# Patient Record
Sex: Female | Born: 1950 | Race: White | Hispanic: No | Smoking: Never smoker
Health system: Southern US, Community
[De-identification: ages and names within clinical notes are randomized; demographics above are authoritative.]

## PROBLEM LIST (undated history)

## (undated) DIAGNOSIS — K760 Fatty (change of) liver, not elsewhere classified: Secondary | ICD-10-CM

## (undated) DIAGNOSIS — M1990 Unspecified osteoarthritis, unspecified site: Secondary | ICD-10-CM

## (undated) DIAGNOSIS — E039 Hypothyroidism, unspecified: Secondary | ICD-10-CM

## (undated) DIAGNOSIS — D649 Anemia, unspecified: Secondary | ICD-10-CM

## (undated) DIAGNOSIS — I10 Essential (primary) hypertension: Principal | ICD-10-CM

## (undated) DIAGNOSIS — D696 Thrombocytopenia, unspecified: Secondary | ICD-10-CM

## (undated) DIAGNOSIS — L900 Lichen sclerosus et atrophicus: Secondary | ICD-10-CM

## (undated) DIAGNOSIS — I499 Cardiac arrhythmia, unspecified: Secondary | ICD-10-CM

## (undated) HISTORY — DX: Lichen sclerosus et atrophicus: L90.0

## (undated) HISTORY — DX: Anemia, unspecified: D64.9

## (undated) HISTORY — DX: Unspecified osteoarthritis, unspecified site: M19.90

## (undated) HISTORY — DX: Fatty (change of) liver, not elsewhere classified: K76.0

## (undated) HISTORY — DX: Essential (primary) hypertension: I10

## (undated) HISTORY — DX: Hypothyroidism, unspecified: E03.9

---
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
## 1997-09-21 ENCOUNTER — Ambulatory Visit (HOSPITAL_COMMUNITY): Admission: RE | Admit: 1997-09-21 | Discharge: 1997-09-21 | Payer: Self-pay | Admitting: Obstetrics and Gynecology

## 1997-09-21 ENCOUNTER — Encounter: Payer: Self-pay | Admitting: Obstetrics and Gynecology

## 1998-09-24 ENCOUNTER — Ambulatory Visit (HOSPITAL_COMMUNITY): Admission: RE | Admit: 1998-09-24 | Discharge: 1998-09-24 | Payer: Self-pay | Admitting: Obstetrics and Gynecology

## 1998-09-24 ENCOUNTER — Encounter: Payer: Self-pay | Admitting: Obstetrics and Gynecology

## 2000-02-08 ENCOUNTER — Ambulatory Visit (HOSPITAL_COMMUNITY): Admission: RE | Admit: 2000-02-08 | Discharge: 2000-02-08 | Payer: Self-pay | Admitting: Obstetrics and Gynecology

## 2000-02-08 ENCOUNTER — Encounter: Payer: Self-pay | Admitting: Obstetrics and Gynecology

## 2000-03-27 ENCOUNTER — Encounter: Admission: RE | Admit: 2000-03-27 | Discharge: 2000-03-27 | Payer: Self-pay | Admitting: Obstetrics and Gynecology

## 2000-03-27 ENCOUNTER — Encounter: Payer: Self-pay | Admitting: Obstetrics and Gynecology

## 2000-04-17 ENCOUNTER — Encounter: Admission: RE | Admit: 2000-04-17 | Discharge: 2000-04-17 | Payer: Self-pay | Admitting: Obstetrics and Gynecology

## 2000-04-17 ENCOUNTER — Encounter: Payer: Self-pay | Admitting: Obstetrics and Gynecology

## 2009-01-30 LAB — HM DIABETES EYE EXAM: HM Diabetic Eye Exam: NORMAL

## 2010-02-20 ENCOUNTER — Encounter: Payer: Self-pay | Admitting: Chiropractic Medicine

## 2010-12-19 ENCOUNTER — Encounter: Payer: Self-pay | Admitting: *Deleted

## 2010-12-19 ENCOUNTER — Telehealth: Payer: Self-pay | Admitting: *Deleted

## 2010-12-19 ENCOUNTER — Encounter: Payer: Self-pay | Admitting: Internal Medicine

## 2010-12-19 ENCOUNTER — Ambulatory Visit (INDEPENDENT_AMBULATORY_CARE_PROVIDER_SITE_OTHER): Payer: 59 | Admitting: Internal Medicine

## 2010-12-19 VITALS — BP 152/100 | HR 94 | Temp 99.0°F | Ht 63.0 in | Wt 254.4 lb

## 2010-12-19 DIAGNOSIS — I10 Essential (primary) hypertension: Secondary | ICD-10-CM

## 2010-12-19 DIAGNOSIS — Z0000 Encounter for general adult medical examination without abnormal findings: Secondary | ICD-10-CM

## 2010-12-19 DIAGNOSIS — E669 Obesity, unspecified: Secondary | ICD-10-CM

## 2010-12-19 MED ORDER — LOSARTAN POTASSIUM 100 MG PO TABS: 100.0000 mg | ORAL_TABLET | Freq: Every day | ORAL | Status: DC

## 2010-12-19 NOTE — Progress Notes (Signed)
  Subjective:    Patient ID: Samantha Lutz, female    DOB: 1951-01-16, 60 y.o.   MRN: [id number]  HPI  New patient to me, here today to establish care  Concern about high blood pressure Noted by dentist 2 weeks ago while preparing for dental work Positive family history of same, no personal history of known disease No history of known kidney disease heart disease or prior stroke Also no cholesterol problems or known diabetes Reports weight gain approximately 4 years ago following death of her mother, but relatively stable weight in the past 2-3 years denies regular exercise, nor does she follow low fat, heart healthy diet  Past Medical History  Diagnosis Date  . Chicken pox     Review of Systems  Respiratory: Negative for cough and shortness of breath.   Cardiovascular: Negative for chest pain, palpitations and leg swelling.  Neurological: Negative for dizziness, seizures, weakness and headaches.       Objective:   Physical Exam BP 152/100  Pulse 94  Temp(Src) 99 F (37.2 C) (Oral)  Ht 5\' 3"  (1.6 m)  Wt 254 lb 6.4 oz (115.395 kg)  BMI 45.06 kg/m2  SpO2 99% Weight: 254 lb 6.4 oz (115.395 kg)  Constitutional: She is overweight; appears well-developed and well-nourished. No distress.  HENT: Head: Normocephalic and atraumatic. Ears: B TMs ok, no erythema or effusion; Nose: Nose normal.  Mouth/Throat: Oropharynx is clear and moist. No oropharyngeal exudate.  Eyes: Conjunctivae and EOM are normal. Pupils are equal, round, and reactive to light. No scleral icterus.  Neck: Thick; Normal range of motion. Neck supple. No JVD present. No thyromegaly present.  Cardiovascular: Normal rate, regular rhythm and normal heart sounds.  No murmur heard. No BLE edema. Pulmonary/Chest: Effort normal and breath sounds normal. No respiratory distress. She has no wheezes. Neurological: She is alert and oriented to person, place, and time. No cranial nerve deficit. Coordination normal.  Skin:  Skin is warm and dry. No rash noted. No erythema.  Psychiatric: She has a normal mood and affect. Her behavior is normal. Judgment and thought content normal.   No results found for this basename: WBC, HGB, HCT, PLT, GLUCOSE, CHOL, TRIG, HDL, LDLDIRECT, LDLCALC, ALT, AST, NA, K, CL, CREATININE, BUN, CO2, TSH, PSA, INR, GLUF, HGBA1C, MICROALBUR       Assessment & Plan:  See problem list. Medications and labs reviewed today.

## 2010-12-19 NOTE — Telephone Encounter (Signed)
Entered labs in epic...12/19/10@11 :37am/lmb

## 2010-12-19 NOTE — Telephone Encounter (Signed)
Message copied by Deatra James on Mon Dec 19, 2010 11:37 AM ------      Message from: COUSIN, SHARON T      Created: Mon Dec 19, 2010 11:01 AM      Regarding: PHY DATE:  01/03/11  @ 8A       Lynford Humphrey

## 2010-12-19 NOTE — Patient Instructions (Signed)
It was good to meet you today. We have reviewed your prior records and medical hitstory today Start losartan for your high blood pressure - Your prescription(s) have been submitted to your pharmacy. Please take as directed and contact our office if you believe you are having problem(s) with the medication(s). Letter written for you to get your dentist regarding high blood pressure and starting medications today for same Please schedule followup in 2-4 weeks to recheck blood pressure, adjust medications and check labs, call sooner if problems.

## 2010-12-19 NOTE — Assessment & Plan Note (Signed)
New dx today, noted by dentist in prior weeks Strong FH same Start ARB today- electronic prescription provided, potential risk-benefit of medication review Also education on health and complications of uncontrolled blood pressure including heart disease, stroke risk and kidney disease Will plan labs at next visit in 2-4 weeks with recheck blood pressure to include diabetes and cholesterol screening as well as renal function and urinalysis  BP Readings from Last 3 Encounters:  12/19/10 152/100

## 2010-12-19 NOTE — Assessment & Plan Note (Signed)
Wt Readings from Last 3 Encounters:  12/19/10 254 lb 6.4 oz (115.395 kg)   Reviewed importance of healthy weight to medical control of issues such as hypertension Advise slow initiation of aerobic program on treadmill, 15 minutes 3 times a week working up to 30 minutes 5 times a week Also reviewed heart healthy diet and importance of low sodium in relation to new diagnosis hypertension We'll continue to follow, counsel and work with patient on same

## 2010-12-30 ENCOUNTER — Other Ambulatory Visit (INDEPENDENT_AMBULATORY_CARE_PROVIDER_SITE_OTHER): Payer: 59

## 2010-12-30 DIAGNOSIS — Z0000 Encounter for general adult medical examination without abnormal findings: Secondary | ICD-10-CM

## 2010-12-30 LAB — CBC WITH DIFFERENTIAL/PLATELET
Basophils Absolute: 0 10*3/uL (ref 0.0–0.1)
Basophils Relative: 0.3 % (ref 0.0–3.0)
Eosinophils Absolute: 0.1 10*3/uL (ref 0.0–0.7)
Eosinophils Relative: 1.8 % (ref 0.0–5.0)
HCT: 40 % (ref 36.0–46.0)
Hemoglobin: 13.9 g/dL (ref 12.0–15.0)
Lymphocytes Relative: 31.1 % (ref 12.0–46.0)
Lymphs Abs: 2.1 10*3/uL (ref 0.7–4.0)
MCHC: 34.7 g/dL (ref 30.0–36.0)
MCV: 91.4 fl (ref 78.0–100.0)
Monocytes Absolute: 0.7 10*3/uL (ref 0.1–1.0)
Monocytes Relative: 10.2 % (ref 3.0–12.0)
Neutro Abs: 3.9 10*3/uL (ref 1.4–7.7)
Neutrophils Relative %: 56.6 % (ref 43.0–77.0)
Platelets: 224 10*3/uL (ref 150.0–400.0)
RBC: 4.38 Mil/uL (ref 3.87–5.11)
RDW: 13.1 % (ref 11.5–14.6)
WBC: 6.9 10*3/uL (ref 4.5–10.5)

## 2010-12-30 LAB — LIPID PANEL
Cholesterol: 179 mg/dL (ref 0–200)
HDL: 43.5 mg/dL (ref >39.00)
LDL Cholesterol: 108 mg/dL — ABNORMAL HIGH (ref 0–99)
Total CHOL/HDL Ratio: 4
Triglycerides: 137 mg/dL (ref 0.0–149.0)
VLDL: 27.4 mg/dL (ref 0.0–40.0)

## 2010-12-30 LAB — BASIC METABOLIC PANEL
BUN: 13 mg/dL (ref 6–23)
CO2: 28 mEq/L (ref 19–32)
Calcium: 9.2 mg/dL (ref 8.4–10.5)
Chloride: 104 mEq/L (ref 96–112)
Creatinine, Ser: 0.8 mg/dL (ref 0.4–1.2)
GFR: 75.08 mL/min (ref >60.00)
Glucose, Bld: 102 mg/dL — ABNORMAL HIGH (ref 70–99)
Potassium: 4 mEq/L (ref 3.5–5.1)
Sodium: 140 mEq/L (ref 135–145)

## 2010-12-30 LAB — URINALYSIS, ROUTINE W REFLEX MICROSCOPIC
Bilirubin Urine: NEGATIVE
Ketones, ur: NEGATIVE
Nitrite: NEGATIVE
Specific Gravity, Urine: 1.015 (ref 1.000–1.030)
Total Protein, Urine: NEGATIVE
Urine Glucose: NEGATIVE
Urobilinogen, UA: 0.2 (ref 0.0–1.0)
pH: 5.5 (ref 5.0–8.0)

## 2010-12-30 LAB — HEPATIC FUNCTION PANEL
ALT: 75 U/L — ABNORMAL HIGH (ref 0–35)
AST: 73 U/L — ABNORMAL HIGH (ref 0–37)
Albumin: 4 g/dL (ref 3.5–5.2)
Alkaline Phosphatase: 64 U/L (ref 39–117)
Bilirubin, Direct: 0.2 mg/dL (ref 0.0–0.3)
Total Bilirubin: 0.5 mg/dL (ref 0.3–1.2)
Total Protein: 7.5 g/dL (ref 6.0–8.3)

## 2010-12-30 LAB — TSH: TSH: 4.31 u[IU]/mL (ref 0.35–5.50)

## 2011-01-03 ENCOUNTER — Ambulatory Visit (INDEPENDENT_AMBULATORY_CARE_PROVIDER_SITE_OTHER): Payer: 59 | Admitting: Internal Medicine

## 2011-01-03 ENCOUNTER — Other Ambulatory Visit (INDEPENDENT_AMBULATORY_CARE_PROVIDER_SITE_OTHER): Payer: 59

## 2011-01-03 ENCOUNTER — Encounter: Payer: Self-pay | Admitting: Internal Medicine

## 2011-01-03 VITALS — BP 130/82 | HR 95 | Temp 98.4°F | Wt 248.8 lb

## 2011-01-03 DIAGNOSIS — I10 Essential (primary) hypertension: Secondary | ICD-10-CM

## 2011-01-03 DIAGNOSIS — Z124 Encounter for screening for malignant neoplasm of cervix: Secondary | ICD-10-CM

## 2011-01-03 DIAGNOSIS — Z0000 Encounter for general adult medical examination without abnormal findings: Secondary | ICD-10-CM

## 2011-01-03 DIAGNOSIS — R7989 Other specified abnormal findings of blood chemistry: Secondary | ICD-10-CM

## 2011-01-03 DIAGNOSIS — Z1239 Encounter for other screening for malignant neoplasm of breast: Secondary | ICD-10-CM

## 2011-01-03 DIAGNOSIS — Z1211 Encounter for screening for malignant neoplasm of colon: Secondary | ICD-10-CM

## 2011-01-03 LAB — HEPATIC FUNCTION PANEL
ALT: 54 U/L — ABNORMAL HIGH (ref 0–35)
AST: 46 U/L — ABNORMAL HIGH (ref 0–37)
Albumin: 3.9 g/dL (ref 3.5–5.2)
Alkaline Phosphatase: 61 U/L (ref 39–117)
Bilirubin, Direct: 0.1 mg/dL (ref 0.0–0.3)
Total Bilirubin: 0.6 mg/dL (ref 0.3–1.2)
Total Protein: 7.2 g/dL (ref 6.0–8.3)

## 2011-01-03 NOTE — Assessment & Plan Note (Signed)
Strong FH same Started ARB 12/2010>>taking only 1/2 tab but BP improved The current medical regimen is effective;  continue present plan and medications.   BP Readings from Last 3 Encounters:  01/03/11 130/82  12/19/10 152/100

## 2011-01-03 NOTE — Patient Instructions (Signed)
It was good to see you today. Test(s) ordered today to recheck liver tests - blood and ultrasound of liver. Your results will be called to you after review (48-72hours after test completion). If any changes need to be made, you will be notified at that time. we'll make referral to gynecology for PAP, GI for colonoscopy and for mammogram. Our office will contact you regarding appointment(s) once made. Ok to continue half tablet of losartan for blood pressure control -your blood pressure is much improved Consider the shingles shot and discuss with her insurance company if they will cover for this recommended vaccination You've declined flu shot today -but I encourage you to consider becoming immunized against flu and get the flu shot Please schedule followup in 6 months for blood pressure check, medication review, liver check and weight check; call sooner if problems.

## 2011-01-03 NOTE — Progress Notes (Signed)
Subjective:    Patient ID: Samantha Lutz, female    DOB: 1950-11-12, 60 y.o.   MRN: [id number]  HPI patient is here today for annual physical. Patient feels well and has no complaints.  Also reviewed prior medical issues: hypertension - started losartan 12/2010. the patient reports compliance with medication(s) as prescribed. Denies adverse side effects.  Note increase LFTs on labs -no nausea, right upper quadrant pain. No history of IV drug abuse or blood transfusions-no recent or remote travel.  Past Medical History  Diagnosis Date  . Hypertension    Family History  Problem Relation Age of Onset  . Diabetes Mother   . Stroke Maternal Grandmother 49  . Hypertension Mother   . Arthritis Other   . Hypertension Daughter   . Hypertension Maternal Grandmother   . COPD Mother    History  Substance Use Topics  . Smoking status: Never Smoker   . Smokeless tobacco: Not on file  . Alcohol Use: No     Review of Systems Constitutional: Negative for fever or weight change.  Respiratory: Negative for cough and shortness of breath.   Cardiovascular: Negative for chest pain or palpitations.  Gastrointestinal: Negative for abdominal pain, no bowel changes.  Musculoskeletal: Negative for gait problem or joint swelling.  Skin: Negative for rash.  chronic age related to lichen sclerosis genital region and left breast Neurological: Negative for dizziness or headache.  No other specific complaints in a complete review of systems (except as listed in HPI above).     Objective:   Physical Exam BP 130/82  Pulse 95  Temp(Src) 98.4 F (36.9 C) (Oral)  Wt 248 lb 12.8 oz (112.855 kg)  SpO2 99% Wt Readings from Last 3 Encounters:  01/03/11 248 lb 12.8 oz (112.855 kg)  12/19/10 254 lb 6.4 oz (115.395 kg)   Constitutional: She appears well-developed and well-nourished. No distress.  HENT: Head: Normocephalic and atraumatic. Ears: B TMs ok, no erythema or effusion; Nose: Nose normal.    Mouth/Throat: Oropharynx is clear and moist. No oropharyngeal exudate.  Eyes: Conjunctivae and EOM are normal. Pupils are equal, round, and reactive to light. No scleral icterus.  Neck: Normal range of motion. Neck supple. No JVD present. No thyromegaly present.  Cardiovascular: Normal rate, regular rhythm and normal heart sounds.  No murmur heard. No BLE edema. Pulmonary/Chest: Effort normal and breath sounds normal. No respiratory distress. She has no wheezes.  Abdominal: obese, Soft. Bowel sounds are normal. She exhibits no distension. There is no tenderness. no masses GU: Defer to GYN  Musculoskeletal: Normal range of motion, no joint effusions. No gross deformities Neurological: She is alert and oriented to person, place, and time. No cranial nerve deficit. Coordination normal.  Skin: Skin is warm and dry. No rash noted. No erythema.  lichen sclerosus patch left anterior chest and breast at brought 1  Psychiatric: She has a normal mood and affect. Her behavior is normal. Judgment and thought content normal.   Lab Results  Component Value Date   WBC 6.9 12/30/2010   HGB 13.9 12/30/2010   HCT 40.0 12/30/2010   PLT 224.0 12/30/2010   GLUCOSE 102* 12/30/2010   CHOL 179 12/30/2010   TRIG 137.0 12/30/2010   HDL 43.50 12/30/2010   LDLCALC 108* 12/30/2010   ALT 75* 12/30/2010   AST 73* 12/30/2010   NA 140 12/30/2010   K 4.0 12/30/2010   CL 104 12/30/2010   CREATININE 0.8 12/30/2010   BUN 13 12/30/2010   CO2 28  12/30/2010   TSH 4.31 12/30/2010   EKG: sinus at 92 bpm - PR .11 - no other arrythmia or ischemic abnormalities      Assessment & Plan:  CPX - v70.0 - Patient has been counseled on age-appropriate routine health concerns for screening and prevention. These are reviewed and up-to-date. Immunizations are up-to-date or declined. Labs and ECG reviewed.  Increase LFTs - incidental on CPX - no prior hx same -no symptoms of GI problems or history of transfusion/risk factors for  Hep C - check serologies now, recheck LFTs and abdominal ultrasound to rule out fatty liver or other infiltrative process/GB stones

## 2011-01-04 LAB — HEPATITIS C ANTIBODY: HCV Ab: NEGATIVE

## 2011-01-09 ENCOUNTER — Other Ambulatory Visit: Payer: 59

## 2011-01-10 ENCOUNTER — Ambulatory Visit
Admission: RE | Admit: 2011-01-10 | Discharge: 2011-01-10 | Disposition: A | Discharge status: Home / Self Care | Payer: 59 | Source: Ambulatory Visit | Attending: Internal Medicine | Admitting: Internal Medicine

## 2011-01-10 DIAGNOSIS — R7989 Other specified abnormal findings of blood chemistry: Secondary | ICD-10-CM

## 2011-02-02 ENCOUNTER — Ambulatory Visit: Payer: 59

## 2011-02-06 ENCOUNTER — Ambulatory Visit
Admission: RE | Admit: 2011-02-06 | Discharge: 2011-02-06 | Disposition: A | Discharge status: Home / Self Care | Payer: 59 | Source: Ambulatory Visit | Attending: Internal Medicine | Admitting: Internal Medicine

## 2011-02-06 DIAGNOSIS — Z1239 Encounter for other screening for malignant neoplasm of breast: Secondary | ICD-10-CM

## 2011-07-04 ENCOUNTER — Ambulatory Visit: Payer: 59 | Admitting: Internal Medicine

## 2011-07-12 ENCOUNTER — Ambulatory Visit (INDEPENDENT_AMBULATORY_CARE_PROVIDER_SITE_OTHER)
Admission: RE | Admit: 2011-07-12 | Discharge: 2011-07-12 | Disposition: A | Discharge status: Home / Self Care | Payer: 59 | Source: Ambulatory Visit | Attending: Internal Medicine | Admitting: Internal Medicine

## 2011-07-12 ENCOUNTER — Encounter: Payer: Self-pay | Admitting: Internal Medicine

## 2011-07-12 ENCOUNTER — Ambulatory Visit (INDEPENDENT_AMBULATORY_CARE_PROVIDER_SITE_OTHER): Payer: 59 | Admitting: Internal Medicine

## 2011-07-12 ENCOUNTER — Other Ambulatory Visit (INDEPENDENT_AMBULATORY_CARE_PROVIDER_SITE_OTHER): Payer: 59

## 2011-07-12 VITALS — BP 118/72 | HR 84 | Temp 98.3°F | Ht 63.0 in | Wt 238.6 lb

## 2011-07-12 DIAGNOSIS — M25561 Pain in right knee: Secondary | ICD-10-CM

## 2011-07-12 DIAGNOSIS — I10 Essential (primary) hypertension: Secondary | ICD-10-CM

## 2011-07-12 DIAGNOSIS — M25569 Pain in unspecified knee: Secondary | ICD-10-CM

## 2011-07-12 DIAGNOSIS — R7989 Other specified abnormal findings of blood chemistry: Secondary | ICD-10-CM

## 2011-07-12 LAB — HEPATIC FUNCTION PANEL
ALT: 31 U/L (ref 0–35)
AST: 24 U/L (ref 0–37)
Albumin: 4 g/dL (ref 3.5–5.2)
Alkaline Phosphatase: 58 U/L (ref 39–117)
Bilirubin, Direct: 0.1 mg/dL (ref 0.0–0.3)
Total Bilirubin: 0.5 mg/dL (ref 0.3–1.2)
Total Protein: 7.3 g/dL (ref 6.0–8.3)

## 2011-07-12 MED ORDER — ACETAMINOPHEN 500 MG PO TABS: 1000.0000 mg | ORAL_TABLET | Freq: Two times a day (BID) | ORAL | Status: AC

## 2011-07-12 NOTE — Assessment & Plan Note (Signed)
Chronic with intermittent swelling, none now No injury Suspect DJD Check xray and recommended scheduled tylenol

## 2011-07-12 NOTE — Patient Instructions (Addendum)
It was good to see you today. Test(s) ordered today. Your results will be called to you after review (48-72hours after test completion). If any changes need to be made, you will be notified at that time. Tylenol as discussed for arthritis pain Please schedule followup in 6 months for physical and labs, call sooner if problems.

## 2011-07-12 NOTE — Progress Notes (Signed)
  Subjective:    Patient ID: Samantha Lutz, female    DOB: Jun 16, 1950, 61 y.o.   MRN: [id number]  HPI  Here for follow up - reviewed prior medical issues: hypertension - started losartan 12/2010. the patient reports compliance with medication(s) as prescribed. Denies adverse side effects.  transient increase LFTs on labs 11/12 -no nausea, right upper quadrant pain. No history of IV drug abuse or blood transfusions-no recent or remote travel.   Past Medical History  Diagnosis Date  . Hypertension   . Lichen sclerosus     steroids as needed    Review of Systems  Constitutional: Negative for fever or weight change.  Respiratory: Negative for cough and shortness of breath.   Cardiovascular: Negative for chest pain or palpitations.  Musculoskeletal: Occgait problem with R knee joint swelling, none now.      Objective:   Physical Exam  BP 118/72  Pulse 84  Temp 98.3 F (36.8 C) (Oral)  Ht 5\' 3"  (1.6 m)  Wt 238 lb 9.6 oz (108.228 kg)  BMI 42.27 kg/m2  SpO2 99% Wt Readings from Last 3 Encounters:  07/12/11 238 lb 9.6 oz (108.228 kg)  01/03/11 248 lb 12.8 oz (112.855 kg)  12/19/10 254 lb 6.4 oz (115.395 kg)   Constitutional: She appears well-developed and well-nourished. No distress. g-dtr at side Cardiovascular: Normal rate, regular rhythm and normal heart sounds.  No murmur heard. No BLE edema. Pulmonary/Chest: Effort normal and breath sounds normal. No respiratory distress. She has no wheezes.  Abdominal: obese, Soft. Bowel sounds are normal. She exhibits no distension. There is no tenderness. no masses Musculoskeletal: R knee - boggy synovitis - tender to palpation over joint line; FROM and ligamentous function intact Psychiatric: She has a normal mood and affect. Her behavior is normal. Judgment and thought content normal.   Lab Results  Component Value Date   WBC 6.9 12/30/2010   HGB 13.9 12/30/2010   HCT 40.0 12/30/2010   PLT 224.0 12/30/2010   GLUCOSE 102*  12/30/2010   CHOL 179 12/30/2010   TRIG 137.0 12/30/2010   HDL 43.50 12/30/2010   LDLCALC 108* 12/30/2010   ALT 54* 01/03/2011   AST 46* 01/03/2011   NA 140 12/30/2010   K 4.0 12/30/2010   CL 104 12/30/2010   CREATININE 0.8 12/30/2010   BUN 13 12/30/2010   CO2 28 12/30/2010   TSH 4.31 12/30/2010   EKG: sinus at 92 bpm - PR .11 - no other arrythmia or ischemic abnormalities      Assessment & Plan:

## 2011-07-12 NOTE — Assessment & Plan Note (Signed)
Strong FH same Started ARB 12/2010>>taking only 1/2 tab but BP improved The current medical regimen is effective;  continue present plan and medications.   BP Readings from Last 3 Encounters:  07/12/11 118/72  01/03/11 130/82  12/19/10 152/100

## 2011-07-12 NOTE — Assessment & Plan Note (Signed)
Transient and incidental 12/2010 - resolved on recheck 12/2010 Hep C Ab neg and Abd US with fatty liver, no mass or stones No GI symptoms  Recheck LFTs now to monitor 

## 2012-01-11 ENCOUNTER — Other Ambulatory Visit (INDEPENDENT_AMBULATORY_CARE_PROVIDER_SITE_OTHER): Payer: 59

## 2012-01-11 ENCOUNTER — Encounter: Payer: Self-pay | Admitting: Internal Medicine

## 2012-01-11 ENCOUNTER — Ambulatory Visit (INDEPENDENT_AMBULATORY_CARE_PROVIDER_SITE_OTHER): Payer: 59 | Admitting: Internal Medicine

## 2012-01-11 VITALS — BP 130/82 | HR 97 | Temp 98.8°F | Ht 63.0 in | Wt 242.4 lb

## 2012-01-11 DIAGNOSIS — I10 Essential (primary) hypertension: Secondary | ICD-10-CM

## 2012-01-11 DIAGNOSIS — Z2911 Encounter for prophylactic immunotherapy for respiratory syncytial virus (RSV): Secondary | ICD-10-CM

## 2012-01-11 DIAGNOSIS — Z23 Encounter for immunization: Secondary | ICD-10-CM

## 2012-01-11 DIAGNOSIS — Z0000 Encounter for general adult medical examination without abnormal findings: Secondary | ICD-10-CM

## 2012-01-11 DIAGNOSIS — Z1211 Encounter for screening for malignant neoplasm of colon: Secondary | ICD-10-CM

## 2012-01-11 DIAGNOSIS — E669 Obesity, unspecified: Secondary | ICD-10-CM

## 2012-01-11 LAB — HEPATIC FUNCTION PANEL
ALT: 39 U/L — ABNORMAL HIGH (ref 0–35)
AST: 38 U/L — ABNORMAL HIGH (ref 0–37)
Albumin: 4.1 g/dL (ref 3.5–5.2)
Alkaline Phosphatase: 59 U/L (ref 39–117)
Bilirubin, Direct: 0.1 mg/dL (ref 0.0–0.3)
Total Bilirubin: 0.8 mg/dL (ref 0.3–1.2)
Total Protein: 7.8 g/dL (ref 6.0–8.3)

## 2012-01-11 LAB — URINALYSIS, ROUTINE W REFLEX MICROSCOPIC
Bilirubin Urine: NEGATIVE
Hgb urine dipstick: NEGATIVE
Ketones, ur: NEGATIVE
Leukocytes, UA: NEGATIVE
Nitrite: NEGATIVE
Specific Gravity, Urine: >=1.03 (ref 1.000–1.030)
Total Protein, Urine: NEGATIVE
Urine Glucose: NEGATIVE
Urobilinogen, UA: 0.2 (ref 0.0–1.0)
pH: 5.5 (ref 5.0–8.0)

## 2012-01-11 LAB — LIPID PANEL
Cholesterol: 171 mg/dL (ref 0–200)
HDL: 39.9 mg/dL (ref >39.00)
LDL Cholesterol: 98 mg/dL (ref 0–99)
Total CHOL/HDL Ratio: 4
Triglycerides: 165 mg/dL — ABNORMAL HIGH (ref 0.0–149.0)
VLDL: 33 mg/dL (ref 0.0–40.0)

## 2012-01-11 LAB — BASIC METABOLIC PANEL
BUN: 15 mg/dL (ref 6–23)
CO2: 25 mEq/L (ref 19–32)
Calcium: 9.3 mg/dL (ref 8.4–10.5)
Chloride: 105 mEq/L (ref 96–112)
Creatinine, Ser: 0.9 mg/dL (ref 0.4–1.2)
GFR: 66.35 mL/min (ref >60.00)
Glucose, Bld: 119 mg/dL — ABNORMAL HIGH (ref 70–99)
Potassium: 4.1 mEq/L (ref 3.5–5.1)
Sodium: 137 mEq/L (ref 135–145)

## 2012-01-11 LAB — CBC WITH DIFFERENTIAL/PLATELET
Basophils Absolute: 0 10*3/uL (ref 0.0–0.1)
Basophils Relative: 0.3 % (ref 0.0–3.0)
Eosinophils Absolute: 0.2 10*3/uL (ref 0.0–0.7)
Eosinophils Relative: 2.4 % (ref 0.0–5.0)
HCT: 39.7 % (ref 36.0–46.0)
Hemoglobin: 13.8 g/dL (ref 12.0–15.0)
Lymphocytes Relative: 27.8 % (ref 12.0–46.0)
Lymphs Abs: 2.2 10*3/uL (ref 0.7–4.0)
MCHC: 34.8 g/dL (ref 30.0–36.0)
MCV: 89.8 fl (ref 78.0–100.0)
Monocytes Absolute: 0.8 10*3/uL (ref 0.1–1.0)
Monocytes Relative: 10.3 % (ref 3.0–12.0)
Neutro Abs: 4.6 10*3/uL (ref 1.4–7.7)
Neutrophils Relative %: 59.2 % (ref 43.0–77.0)
Platelets: 212 10*3/uL (ref 150.0–400.0)
RBC: 4.42 Mil/uL (ref 3.87–5.11)
RDW: 13.2 % (ref 11.5–14.6)
WBC: 7.8 10*3/uL (ref 4.5–10.5)

## 2012-01-11 LAB — TSH: TSH: 6.88 u[IU]/mL — ABNORMAL HIGH (ref 0.35–5.50)

## 2012-01-11 NOTE — Progress Notes (Signed)
Subjective:    Patient ID: Samantha Lutz, female    DOB: 08/04/1950, 61 y.o.   MRN: [id number]  HPI  patient is here today for annual physical. Patient feels well and has no complaints.  Also reviewed chronic medical issues: hypertension - started losartan 12/2010. Indep stopped same 10/2011 due to rash side effects - reports hoe BPs normal off meds - home BP log reviewed     Past Medical History  Diagnosis Date  . Hypertension     diet controlled  . Lichen sclerosus     steroids as needed   Family History  Problem Relation Age of Onset  . Diabetes Mother   . Stroke Maternal Grandmother 34  . Hypertension Mother   . Arthritis Other   . Hypertension Daughter   . Hypertension Maternal Grandmother   . COPD Mother    History  Substance Use Topics  . Smoking status: Never Smoker   . Smokeless tobacco: Not on file  . Alcohol Use: No    Review of Systems  Constitutional: Negative for fever or weight change.  Respiratory: Negative for cough and shortness of breath.   Cardiovascular: Negative for chest pain or palpitations.  Gastrointestinal: Negative for abdominal pain, no bowel changes.  Musculoskeletal: Negative for gait problem or joint swelling.  Skin: Negative for rash.  chronic age related to lichen sclerosis genital region and left breast Neurological: Negative for dizziness or headache.  No other specific complaints in a complete review of systems (except as listed in HPI above).     Objective:   Physical Exam  BP 130/82  Pulse 97  Temp 98.8 F (37.1 C) (Oral)  Ht 5\' 3"  (1.6 m)  Wt 242 lb 6.4 oz (109.952 kg)  BMI 42.94 kg/m2  SpO2 98% Wt Readings from Last 3 Encounters:  01/11/12 242 lb 6.4 oz (109.952 kg)  07/12/11 238 lb 9.6 oz (108.228 kg)  01/03/11 248 lb 12.8 oz (112.855 kg)   Constitutional: She is overweight, but appears well-developed and well-nourished. No distress.  HENT: Head: Normocephalic and atraumatic. Ears: B TMs ok, no erythema or  effusion; Nose: Nose normal. Mouth/Throat: Oropharynx is clear and moist. No oropharyngeal exudate.  Eyes: Conjunctivae and EOM are normal. Pupils are equal, round, and reactive to light. No scleral icterus.  Neck: Normal range of motion. Neck supple. No JVD present. No thyromegaly present.  Cardiovascular: Normal rate, regular rhythm and normal heart sounds.  No murmur heard. No BLE edema. Pulmonary/Chest: Effort normal and breath sounds normal. No respiratory distress. She has no wheezes.  Abdominal: obese, Soft. Bowel sounds are normal. She exhibits no distension. There is no tenderness. no masses GU: Defer to GYN  Musculoskeletal: Normal range of motion, no joint effusions. No gross deformities Neurological: She is alert and oriented to person, place, and time. No cranial nerve deficit. Coordination normal.  Skin: Skin is warm and dry. No rash noted. No erythema.  lichen sclerosus patch left anterior chest and breast   Psychiatric: She has a normal mood and affect. Her behavior is normal. Judgment and thought content normal.   Lab Results  Component Value Date   WBC 6.9 12/30/2010   HGB 13.9 12/30/2010   HCT 40.0 12/30/2010   PLT 224.0 12/30/2010   GLUCOSE 102* 12/30/2010   CHOL 179 12/30/2010   TRIG 137.0 12/30/2010   HDL 43.50 12/30/2010   LDLCALC 108* 12/30/2010   ALT 31 07/12/2011   AST 24 07/12/2011   NA 140 12/30/2010  K 4.0 12/30/2010   CL 104 12/30/2010   CREATININE 0.8 12/30/2010   BUN 13 12/30/2010   CO2 28 12/30/2010   TSH 4.31 12/30/2010       Assessment & Plan:  CPX - v70.0 - Patient has been counseled on age-appropriate routine health concerns for screening and prevention. These are reviewed and up-to-date. Immunizations are up-to-date or declined. Labs ordered and reviewed.  See problem list. Medications and labs reviewed today.

## 2012-01-11 NOTE — Patient Instructions (Signed)
It was good to see you today. We have reviewed your prior records including labs and tests today Health Maintenance reviewed - all recommended immunizations and age-appropriate screenings are up-to-date or declined. we'll make referral to GI for colon screening. Our office will contact you regarding appointment(s) once made. Test(s) ordered today. Your results will be released to MyChart (or called to you) after review, usually within 72hours after test completion. If any changes need to be made, you will be notified at that same time. Medications reviewed and updated - none needed at this time. Work on lifestyle changes as discussed (low fat, low carb, increased protein diet; improved exercise efforts; weight loss) to control sugar, blood pressure and cholesterol levels and/or reduce risk of developing other medical problems. Look into LimitLaws.com.cy or other type of food journal to assist you in this process. Please schedule followup in 1 year for medical physical, labs and blood pressure/weight check; call sooner if problems. Health Maintenance, Females A healthy lifestyle and preventative care can promote health and wellness.  Maintain regular health, dental, and eye exams.   Eat a healthy diet. Foods like vegetables, fruits, whole grains, low-fat dairy products, and lean protein foods contain the nutrients you need without too many calories. Decrease your intake of foods high in solid fats, added sugars, and salt. Get information about a proper diet from your caregiver, if necessary.   Regular physical exercise is one of the most important things you can do for your health. Most adults should get at least 150 minutes of moderate-intensity exercise (any activity that increases your heart rate and causes you to sweat) each week. In addition, most adults need muscle-strengthening exercises on 2 or more days a week.     Maintain a healthy weight. The body mass index (BMI) is a screening tool to  identify possible weight problems. It provides an estimate of body fat based on height and weight. Your caregiver can help determine your BMI, and can help you achieve or maintain a healthy weight. For adults 20 years and older:   A BMI below 18.5 is considered underweight.   A BMI of 18.5 to 24.9 is normal.   A BMI of 25 to 29.9 is considered overweight.   A BMI of 30 and above is considered obese.   Maintain normal blood lipids and cholesterol by exercising and minimizing your intake of saturated fat. Eat a balanced diet with plenty of fruits and vegetables. Blood tests for lipids and cholesterol should begin at age 93 and be repeated every 5 years. If your lipid or cholesterol levels are high, you are over 50, or you are a high risk for heart disease, you may need your cholesterol levels checked more frequently. Ongoing high lipid and cholesterol levels should be treated with medicines if diet and exercise are not effective.   If you smoke, find out from your caregiver how to quit. If you do not use tobacco, do not start.   If you are pregnant, do not drink alcohol. If you are breastfeeding, be very cautious about drinking alcohol. If you are not pregnant and choose to drink alcohol, do not exceed 1 drink per day. One drink is considered to be 12 ounces (355 mL) of beer, 5 ounces (148 mL) of wine, or 1.5 ounces (44 mL) of liquor.   Avoid use of street drugs. Do not share needles with anyone. Ask for help if you need support or instructions about stopping the use of drugs.   High  blood pressure causes heart disease and increases the risk of stroke. Blood pressure should be checked at least every 1 to 2 years. Ongoing high blood pressure should be treated with medicines, if weight loss and exercise are not effective.   If you are 104 to 63 years old, ask your caregiver if you should take aspirin to prevent strokes.   Diabetes screening involves taking a blood sample to check your fasting blood  sugar level. This should be done once every 3 years, after age 67, if you are within normal weight and without risk factors for diabetes. Testing should be considered at a younger age or be carried out more frequently if you are overweight and have at least 1 risk factor for diabetes.   Breast cancer screening is essential preventative care for women. You should practice "breast self-awareness." This means understanding the normal appearance and feel of your breasts and may include breast self-examination. Any changes detected, no matter how small, should be reported to a caregiver. Women in their 57s and 30s should have a clinical breast exam (CBE) by a caregiver as part of a regular health exam every 1 to 3 years. After age 83, women should have a CBE every year. Starting at age 68, women should consider having a mammogram (breast X-ray) every year. Women who have a family history of breast cancer should talk to their caregiver about genetic screening. Women at a high risk of breast cancer should talk to their caregiver about having an MRI and a mammogram every year.   The Pap test is a screening test for cervical cancer. Women should have a Pap test starting at age 9. Between ages 42 and 26, Pap tests should be repeated every 2 years. Beginning at age 9, you should have a Pap test every 3 years as long as the past 3 Pap tests have been normal. If you had a hysterectomy for a problem that was not cancer or a condition that could lead to cancer, then you no longer need Pap tests. If you are between ages 81 and 32, and you have had normal Pap tests going back 10 years, you no longer need Pap tests. If you have had past treatment for cervical cancer or a condition that could lead to cancer, you need Pap tests and screening for cancer for at least 20 years after your treatment. If Pap tests have been discontinued, risk factors (such as a new sexual partner) need to be reassessed to determine if screening should  be resumed. Some women have medical problems that increase the chance of getting cervical cancer. In these cases, your caregiver may recommend more frequent screening and Pap tests.   The human papillomavirus (HPV) test is an additional test that may be used for cervical cancer screening. The HPV test looks for the virus that can cause the cell changes on the cervix. The cells collected during the Pap test can be tested for HPV. The HPV test could be used to screen women aged 63 years and older, and should be used in women of any age who have unclear Pap test results. After the age of 109, women should have HPV testing at the same frequency as a Pap test.   Colorectal cancer can be detected and often prevented. Most routine colorectal cancer screening begins at the age of 28 and continues through age 20. However, your caregiver may recommend screening at an earlier age if you have risk factors for colon cancer. On a  yearly basis, your caregiver may provide home test kits to check for hidden blood in the stool. Use of a small camera at the end of a tube, to directly examine the colon (sigmoidoscopy or colonoscopy), can detect the earliest forms of colorectal cancer. Talk to your caregiver about this at age 75, when routine screening begins. Direct examination of the colon should be repeated every 5 to 10 years through age 79, unless early forms of pre-cancerous polyps or small growths are found.   Hepatitis C blood testing is recommended for all people born from [birth year] through 1967 and any individual with known risks for hepatitis C.   Practice safe sex. Use condoms and avoid high-risk sexual practices to reduce the spread of sexually transmitted infections (STIs). Sexually active women aged 19 and younger should be checked for Chlamydia, which is a common sexually transmitted infection. Older women with new or multiple partners should also be tested for Chlamydia. Testing for other STIs is recommended if you  are sexually active and at increased risk.   Osteoporosis is a disease in which the bones lose minerals and strength with aging. This can result in serious bone fractures. The risk of osteoporosis can be identified using a bone density scan. Women ages 71 and over and women at risk for fractures or osteoporosis should discuss screening with their caregivers. Ask your caregiver whether you should be taking a calcium supplement or vitamin D to reduce the rate of osteoporosis.   Menopause can be associated with physical symptoms and risks. Hormone replacement therapy is available to decrease symptoms and risks. You should talk to your caregiver about whether hormone replacement therapy is right for you.   Use sunscreen with a sun protection factor (SPF) of 30 or greater. Apply sunscreen liberally and repeatedly throughout the day. You should seek shade when your shadow is shorter than you. Protect yourself by wearing long sleeves, pants, a wide-brimmed hat, and sunglasses year round, whenever you are outdoors.   Notify your caregiver of new moles or changes in moles, especially if there is a change in shape or color. Also notify your caregiver if a mole is larger than the size of a pencil eraser.   Stay current with your immunizations.  Document Released: 08/01/2010 Document Revised: 04/10/2011 Document Reviewed: 08/01/2010 [facility] Patient Information 2013 [place], [place].   Exercise to Lose Weight Exercise and a healthy diet may help you lose weight. Your doctor may suggest specific exercises. EXERCISE IDEAS AND TIPS  Choose low-cost things you enjoy doing, such as walking, bicycling, or exercising to workout videos.   Take stairs instead of the elevator.   Walk during your lunch break.   Park your car further away from work or school.   Go to a gym or an exercise class.   Start with 5 to 10 minutes of exercise each day. Build up to 30 minutes of exercise 4 to 6 days a week.   Wear shoes  with good support and comfortable clothes.   Stretch before and after working out.   Work out until you breathe harder and your heart beats faster.   Drink extra water when you exercise.   Do not do so much that you hurt yourself, feel dizzy, or get very short of breath.  Exercises that burn about 150 calories:  Running 1  miles in 15 minutes.   Playing volleyball for 45 to 60 minutes.   Washing and waxing a car for 45 to 60 minutes.  Playing touch football for 45 minutes.   Walking 1  miles in 35 minutes.   Pushing a stroller 1  miles in 30 minutes.   Playing basketball for 30 minutes.   Raking leaves for 30 minutes.   Bicycling 5 miles in 30 minutes.   Walking 2 miles in 30 minutes.   Dancing for 30 minutes.   Shoveling snow for 15 minutes.   Swimming laps for 20 minutes.   Walking up stairs for 15 minutes.   Bicycling 4 miles in 15 minutes.   Gardening for 30 to 45 minutes.   Jumping rope for 15 minutes.   Washing windows or floors for 45 to 60 minutes.  Document Released: 02/18/2010 Document Revised: 04/10/2011 Document Reviewed: 02/18/2010 [facility] Patient Information 2013 [place], [place].   Colorectal Cancer Screening Colorectal cancer screening is done to detect early disease. Colorectal refers to the colon and rectum. The colon and rectum are located at the end of the large intestine (digestive system), and carry your bowel movements out of the body. Screening may be done even if you are not experiencing symptoms.   Colorectal cancer screening checks for:  Polyps. These are small growths in the lining of the colon that can turn cancerous.   Cancer that is already growing. Cancer is a cluster of abnormal cells that can cause problems in the body.  REASONS FOR COLORECTAL CANCER SCREENING  It is common for polyps to form in the lining of the colon, especially in older people. These polyps can be cancerous or become cancerous.   Caught early,  colorectal cancer is treatable.   Cancer can be life threatening. Detecting or preventing cancer early can save your life and allow you to enjoy life longer.  TYPES OF SCREENING  Fecal occult blood testing. A stool sample is examined for blood in the laboratory.   Sigmoidoscopy. A sigmoidoscope is used to examine the rectum and lower colon. A sigmoidoscope is a flexible tube with a camera that is inserted through your anus to examine your lower rectum.   Colonoscopy. The longer colonoscope is used to examine the entire colon. A colonoscope is also a thin, flexible tube with a camera. This test examines the colon and rectum.  Other tests include:  Digital rectal exam.   Barium enema.   Stool DNA test.   Virtual colonoscopy is the use of computerized X-ray scan (computed tomography, CT) to take X-ray images of your colon.  WHO SHOULD HAVE COLORECTAL CANCER SCREENING?   Screening is recommended for all adults aged 24 to 75 years.   Screening is generally done every 5 to 10 years or more frequently if you have a family history or symptoms.   Screening is rarely recommended in adults aged 15 to 85 years. Screening is not recommended in adults aged 72 years and older. Your caregiver may recommend screening at a younger age and more frequent screening if you have:  A history of colorectal cancer or polyps.   Family members with histories of colorectal cancer or polyps.   Inflammatory bowel disease, such as ulcerative colitis or Crohn's disease.   A type of hereditary colon cancer syndrome.  Talk with your caregiver about any symptoms, personal and family history. SYMPTOMS OF COLORECTAL CANCER It is important to discuss the following symptoms with your caregiver. These symptoms may be the result of other conditions and may be easily treated:  Rectal bleeding.   Blood in your stool.   Changes in bowel movements (hard or  loose stools). These changes may last several weeks.   Abdominal  cramping.   Feeling the pressure to have a bowel movement when there is no bowel movement.   Feeling tired or weak.   Unexplained weight loss.   Unexplained low red blood cell count. This may also be called iron deficiency anemia.  HOME CARE INSTRUCTIONS    Follow up with your caregiver as directed.   Follow all instructions for preparation before your test as well as after.  PREVENTION   Following healthy lifestyle habits each day can reduce your chance of getting colorectal cancer and many other types of cancer:  Eat a healthy, well-balanced diet rich in fruits and vegetables and low in fats, sugars and cholesterol.   Stay active. Try to exercise at least 4 to 6 times per week for 30 minutes.   Maintain a healthy weight. Ask your caregiver what a healthy weight range is for you.   Women should only drink 1 alcoholic drink per day. Men should only drink 2 alcoholic drinks per day.   Quit smoking.  SEEK MEDICAL CARE IF:    You experience abdominal or rectal symptoms (see Symptoms of Colorectal Cancer).   Your gastrointestinal issues (constipation, diarrhea) do not go away as expected.   You have questions or concerns.  FOR MORE INFORMATION  American Academy of Family Physicians www.familydoctor.org   Centers for Disease Control and Prevention FootballExhibition.com.br   [place] Preventive Services Task Force www.uspreventiveservicestaskforce.org   American Cancer Society www.cancer.org  MAKE SURE YOU:    Understand these instructions.   Will watch your condition.   Will get help right away if you are not doing well or get worse.  Always follow up with your caregiver to find out the results of your tests. Not all test results may be available during your visit. If your test results are not back during the visit, make an appointment with your caregiver to find out the results. Do not assume everything is normal if you have not heard from your caregiver or the medical facility. It is  important for you to follow up on all of your test results.   Document Released: 07/06/2009 Document Revised: 04/10/2011 Document Reviewed: 07/06/2009 [facility] Patient Information 2013 [place], [place].

## 2012-01-11 NOTE — Assessment & Plan Note (Signed)
Strong FH same Started ARB 12/2010>> stopped 10/2011 due to rash side effects BP improved with diet changes - ok to remain off meds - home BP log reviewed Continue monitor and will call if SBP>130 at home  BP Readings from Last 3 Encounters:  01/11/12 130/82  07/12/11 118/72  01/03/11 130/82

## 2012-01-11 NOTE — Assessment & Plan Note (Signed)
Wt Readings from Last 3 Encounters:  01/11/12 242 lb 6.4 oz (109.952 kg)  07/12/11 238 lb 9.6 oz (108.228 kg)  01/03/11 248 lb 12.8 oz (112.855 kg)   Reviewed importance of healthy weight to medical control of issues such as hypertension Advise slow initiation of aerobic program on treadmill, 15 minutes 3 times a week working up to 30 minutes 5 times a week Also reviewed heart healthy diet and importance of low sodium in relation to co-diagnosis hypertension We'll continue to follow, counsel and work with patient on same

## 2012-02-07 ENCOUNTER — Other Ambulatory Visit: Payer: Self-pay | Admitting: Obstetrics and Gynecology

## 2012-02-07 ENCOUNTER — Other Ambulatory Visit: Payer: Self-pay | Admitting: Internal Medicine

## 2012-02-07 DIAGNOSIS — Z1231 Encounter for screening mammogram for malignant neoplasm of breast: Secondary | ICD-10-CM

## 2012-03-29 ENCOUNTER — Ambulatory Visit
Admission: RE | Admit: 2012-03-29 | Discharge: 2012-03-29 | Disposition: A | Discharge status: Home / Self Care | Payer: 59 | Source: Ambulatory Visit | Attending: Obstetrics and Gynecology | Admitting: Obstetrics and Gynecology

## 2012-03-29 DIAGNOSIS — Z1231 Encounter for screening mammogram for malignant neoplasm of breast: Secondary | ICD-10-CM

## 2012-12-05 ENCOUNTER — Other Ambulatory Visit: Payer: Self-pay

## 2013-01-15 ENCOUNTER — Encounter: Payer: Self-pay | Admitting: Internal Medicine

## 2013-01-15 ENCOUNTER — Other Ambulatory Visit (INDEPENDENT_AMBULATORY_CARE_PROVIDER_SITE_OTHER): Payer: 59

## 2013-01-15 ENCOUNTER — Ambulatory Visit (INDEPENDENT_AMBULATORY_CARE_PROVIDER_SITE_OTHER): Payer: 59 | Admitting: Internal Medicine

## 2013-01-15 VITALS — BP 128/88 | HR 86 | Temp 99.9°F | Ht 63.0 in | Wt 253.0 lb

## 2013-01-15 DIAGNOSIS — R739 Hyperglycemia, unspecified: Secondary | ICD-10-CM

## 2013-01-15 DIAGNOSIS — Z1211 Encounter for screening for malignant neoplasm of colon: Secondary | ICD-10-CM

## 2013-01-15 DIAGNOSIS — I10 Essential (primary) hypertension: Secondary | ICD-10-CM

## 2013-01-15 DIAGNOSIS — R7309 Other abnormal glucose: Secondary | ICD-10-CM

## 2013-01-15 DIAGNOSIS — Z0000 Encounter for general adult medical examination without abnormal findings: Secondary | ICD-10-CM

## 2013-01-15 DIAGNOSIS — E669 Obesity, unspecified: Secondary | ICD-10-CM

## 2013-01-15 DIAGNOSIS — E039 Hypothyroidism, unspecified: Secondary | ICD-10-CM

## 2013-01-15 LAB — CBC WITH DIFFERENTIAL/PLATELET
Basophils Absolute: 0.1 10*3/uL (ref 0.0–0.1)
Basophils Relative: 1 % (ref 0.0–3.0)
Eosinophils Absolute: 0.2 10*3/uL (ref 0.0–0.7)
Eosinophils Relative: 1.7 % (ref 0.0–5.0)
HCT: 41 % (ref 36.0–46.0)
Hemoglobin: 14.3 g/dL (ref 12.0–15.0)
Lymphocytes Relative: 24.1 % (ref 12.0–46.0)
Lymphs Abs: 2.5 10*3/uL (ref 0.7–4.0)
MCHC: 34.7 g/dL (ref 30.0–36.0)
MCV: 88.9 fl (ref 78.0–100.0)
Monocytes Absolute: 0.9 10*3/uL (ref 0.1–1.0)
Monocytes Relative: 8.4 % (ref 3.0–12.0)
Neutro Abs: 6.9 10*3/uL (ref 1.4–7.7)
Neutrophils Relative %: 64.8 % (ref 43.0–77.0)
Platelets: 237 10*3/uL (ref 150.0–400.0)
RBC: 4.61 Mil/uL (ref 3.87–5.11)
RDW: 13.2 % (ref 11.5–14.6)
WBC: 10.6 10*3/uL — ABNORMAL HIGH (ref 4.5–10.5)

## 2013-01-15 LAB — HEPATIC FUNCTION PANEL
ALT: 37 U/L — ABNORMAL HIGH (ref 0–35)
AST: 41 U/L — ABNORMAL HIGH (ref 0–37)
Albumin: 4.2 g/dL (ref 3.5–5.2)
Alkaline Phosphatase: 58 U/L (ref 39–117)
Bilirubin, Direct: 0.1 mg/dL (ref 0.0–0.3)
Total Bilirubin: 0.7 mg/dL (ref 0.3–1.2)
Total Protein: 7.7 g/dL (ref 6.0–8.3)

## 2013-01-15 LAB — BASIC METABOLIC PANEL
BUN: 12 mg/dL (ref 6–23)
CO2: 28 mEq/L (ref 19–32)
Calcium: 9.4 mg/dL (ref 8.4–10.5)
Chloride: 105 mEq/L (ref 96–112)
Creatinine, Ser: 0.9 mg/dL (ref 0.4–1.2)
GFR: 67.86 mL/min (ref >60.00)
Glucose, Bld: 87 mg/dL (ref 70–99)
Potassium: 4.8 mEq/L (ref 3.5–5.1)
Sodium: 140 mEq/L (ref 135–145)

## 2013-01-15 LAB — LIPID PANEL
Cholesterol: 180 mg/dL (ref 0–200)
HDL: 39.1 mg/dL (ref >39.00)
LDL Cholesterol: 105 mg/dL — ABNORMAL HIGH (ref 0–99)
Total CHOL/HDL Ratio: 5
Triglycerides: 182 mg/dL — ABNORMAL HIGH (ref 0.0–149.0)
VLDL: 36.4 mg/dL (ref 0.0–40.0)

## 2013-01-15 LAB — TSH: TSH: 8.43 u[IU]/mL — ABNORMAL HIGH (ref 0.35–5.50)

## 2013-01-15 LAB — HEMOGLOBIN A1C: Hgb A1c MFr Bld: 6.1 % (ref 4.6–6.5)

## 2013-01-15 NOTE — Progress Notes (Signed)
Subjective:    Patient ID: Samantha Lutz, female    DOB: December 27, 1950, 62 y.o.   MRN: [id number]  HPI  patient is here today for annual physical. Patient feels well and has no complaints.  Past Medical History  Diagnosis Date  . Hypertension     diet controlled  . Lichen sclerosus     steroids as needed   Family History  Problem Relation Age of Onset  . Diabetes Mother   . Stroke Maternal Grandmother 56  . Hypertension Mother   . Arthritis Other   . Hypertension Daughter   . Hypertension Maternal Grandmother   . COPD Mother    History  Substance Use Topics  . Smoking status: Never Smoker   . Smokeless tobacco: Not on file  . Alcohol Use: No    Review of Systems  Constitutional: Negative for fatigue and unexpected weight change.  Respiratory: Negative for cough, shortness of breath and wheezing.   Cardiovascular: Negative for chest pain, palpitations and leg swelling.  Gastrointestinal: Negative for nausea, abdominal pain and diarrhea.  Neurological: Negative for dizziness, weakness, light-headedness and headaches.  Psychiatric/Behavioral: Negative for dysphoric mood. The patient is not nervous/anxious.   All other systems reviewed and are negative.       Objective:   Physical Exam BP 128/88  Pulse 86  Temp(Src) 99.9 F (37.7 C) (Oral)  Ht 5\' 3"  (1.6 m)  Wt 253 lb (114.76 kg)  BMI 44.83 kg/m2  SpO2 96% Wt Readings from Last 3 Encounters:  01/15/13 253 lb (114.76 kg)  01/11/12 242 lb 6.4 oz (109.952 kg)  07/12/11 238 lb 9.6 oz (108.228 kg)   Constitutional: She is overweight, but appears well-developed and well-nourished. No distress. g-dtr at side HENT: Head: Normocephalic and atraumatic. Ears: B TMs ok, no erythema or effusion; Nose: Nose normal. Mouth/Throat: Oropharynx is clear and moist. No oropharyngeal exudate.  Eyes: Conjunctivae and EOM are normal. Pupils are equal, round, and reactive to light. No scleral icterus.  Neck: Normal range of  motion. Neck supple. No JVD present. No thyromegaly present.  Cardiovascular: Normal rate, regular rhythm and normal heart sounds.  No murmur heard. No BLE edema. Pulmonary/Chest: Effort normal and breath sounds normal. No respiratory distress. She has no wheezes.  Abdominal: Soft. Bowel sounds are normal. She exhibits no distension. There is no tenderness. no masses Musculoskeletal: Normal range of motion, no joint effusions. No gross deformities Neurological: She is alert and oriented to person, place, and time. No cranial nerve deficit. Coordination, balance, strength, speech and gait are normal.  Skin: Skin is warm and dry. No rash noted. No erythema.  Psychiatric: She has a normal mood and affect. Her behavior is normal. Judgment and thought content normal.   Lab Results  Component Value Date   WBC 7.8 01/11/2012   HGB 13.8 01/11/2012   HCT 39.7 01/11/2012   PLT 212.0 01/11/2012   GLUCOSE 119* 01/11/2012   CHOL 171 01/11/2012   TRIG 165.0* 01/11/2012   HDL 39.90 01/11/2012   LDLCALC 98 01/11/2012   ALT 39* 01/11/2012   AST 38* 01/11/2012   NA 137 01/11/2012   K 4.1 01/11/2012   CL 105 01/11/2012   CREATININE 0.9 01/11/2012   BUN 15 01/11/2012   CO2 25 01/11/2012   TSH 6.88* 01/11/2012   ECG: sinus @ 83 - no ischemic changes or arrythmias     Assessment & Plan:   AWV/v70.0 - Patient has been counseled on age-appropriate routine health concerns for  screening and prevention. These are reviewed and up-to-date. Immunizations are up-to-date or declined. Labs ordered/reviewed and ECG reviewed.  Hyperglycemia - check a1c given weight gain  Obese - weight trends reviewed - The patient is asked to make an attempt to improve diet and exercise patterns to aid in medical management of this problem.

## 2013-01-15 NOTE — Progress Notes (Signed)
Pre-visit discussion using our clinic review tool. No additional management support is needed unless otherwise documented below in the visit note.  

## 2013-01-15 NOTE — Patient Instructions (Addendum)
It was good to see you today.  We have reviewed your prior records including labs and tests today  Health Maintenance reviewed - all recommended immunizations and age-appropriate screenings are up-to-date.  We'll have you screened for colon cancer with [facility]UR[facility] (stool DNA testing) - this packet will be sent to your home by the testing company. Complete instructions as in the box, and we will contact you with the results once available.  Test(s) ordered today. Your results will be released to MyChart (or called to you) after review, usually within 72hours after test completion. If any changes need to be made, you will be notified at that same time.  Medications reviewed and updated, no changes recommended at this time.  Work on lifestyle changes as discussed (low fat, low carb, increased protein diet; improved exercise efforts; weight loss) to control sugar, blood pressure and cholesterol levels and/or reduce risk of developing other medical problems. Look into [email or web address] or other type of food journal to assist you in this process.  Please schedule followup in 12 months for annual exam/labs, call sooner if problems.  Health Maintenance, Female A healthy lifestyle and preventative care can promote health and wellness.  Maintain regular health, dental, and eye exams.  Eat a healthy diet. Foods like vegetables, fruits, whole grains, low-fat dairy products, and lean protein foods contain the nutrients you need without too many calories. Decrease your intake of foods high in solid fats, added sugars, and salt. Get information about a proper diet from your caregiver, if necessary.  Regular physical exercise is one of the most important things you can do for your health. Most adults should get at least 150 minutes of moderate-intensity exercise (any activity that increases your heart rate and causes you to sweat) each week. In addition, most adults need muscle-strengthening exercises on 2 or  more days a week.   Maintain a healthy weight. The body mass index (BMI) is a screening tool to identify possible weight problems. It provides an estimate of body fat based on height and weight. Your caregiver can help determine your BMI, and can help you achieve or maintain a healthy weight. For adults 20 years and older:  A BMI below 18.5 is considered underweight.  A BMI of 18.5 to 24.9 is normal.  A BMI of 25 to 29.9 is considered overweight.  A BMI of 30 and above is considered obese.  Maintain normal blood lipids and cholesterol by exercising and minimizing your intake of saturated fat. Eat a balanced diet with plenty of fruits and vegetables. Blood tests for lipids and cholesterol should begin at age 53 and be repeated every 5 years. If your lipid or cholesterol levels are high, you are over 50, or you are a high risk for heart disease, you may need your cholesterol levels checked more frequently.Ongoing high lipid and cholesterol levels should be treated with medicines if diet and exercise are not effective.  If you smoke, find out from your caregiver how to quit. If you do not use tobacco, do not start.  Lung cancer screening is recommended for adults aged 75 80 years who are at high risk for developing lung cancer because of a history of smoking. Yearly low-dose computed tomography (CT) is recommended for people who have at least a 30-pack-year history of smoking and are a current smoker or have quit within the past 15 years. A pack year of smoking is smoking an average of 1 pack of cigarettes a day for 1 year (for example:  1 pack a day for 30 years or 2 packs a day for 15 years). Yearly screening should continue until the smoker has stopped smoking for at least 15 years. Yearly screening should also be stopped for people who develop a health problem that would prevent them from having lung cancer treatment.  If you are pregnant, do not drink alcohol. If you are breastfeeding, be very  cautious about drinking alcohol. If you are not pregnant and choose to drink alcohol, do not exceed 1 drink per day. One drink is considered to be 12 ounces (355 mL) of beer, 5 ounces (148 mL) of wine, or 1.5 ounces (44 mL) of liquor.  Avoid use of street drugs. Do not share needles with anyone. Ask for help if you need support or instructions about stopping the use of drugs.  High blood pressure causes heart disease and increases the risk of stroke. Blood pressure should be checked at least every 1 to 2 years. Ongoing high blood pressure should be treated with medicines, if weight loss and exercise are not effective.  If you are 45 to 64 years old, ask your caregiver if you should take aspirin to prevent strokes.  Diabetes screening involves taking a blood sample to check your fasting blood sugar level. This should be done once every 3 years, after age 52, if you are within normal weight and without risk factors for diabetes. Testing should be considered at a younger age or be carried out more frequently if you are overweight and have at least 1 risk factor for diabetes.  Breast cancer screening is essential preventative care for women. You should practice "breast self-awareness." This means understanding the normal appearance and feel of your breasts and may include breast self-examination. Any changes detected, no matter how small, should be reported to a caregiver. Women in their 60s and 30s should have a clinical breast exam (CBE) by a caregiver as part of a regular health exam every 1 to 3 years. After age 49, women should have a CBE every year. Starting at age 57, women should consider having a mammogram (breast X-ray) every year. Women who have a family history of breast cancer should talk to their caregiver about genetic screening. Women at a high risk of breast cancer should talk to their caregiver about having an MRI and a mammogram every year.  Breast cancer gene (BRCA)-related cancer risk  assessment is recommended for women who have family members with BRCA-related cancers. BRCA-related cancers include breast, ovarian, tubal, and peritoneal cancers. Having family members with these cancers may be associated with an increased risk for harmful changes (mutations) in the breast cancer genes BRCA1 and BRCA2. Results of the assessment will determine the need for genetic counseling and BRCA1 and BRCA2 testing.  The Pap test is a screening test for cervical cancer. Women should have a Pap test starting at age 57. Between ages 35 and 43, Pap tests should be repeated every 2 years. Beginning at age 48, you should have a Pap test every 3 years as long as the past 3 Pap tests have been normal. If you had a hysterectomy for a problem that was not cancer or a condition that could lead to cancer, then you no longer need Pap tests. If you are between ages 24 and 69, and you have had normal Pap tests going back 10 years, you no longer need Pap tests. If you have had past treatment for cervical cancer or a condition that could lead to cancer,  you need Pap tests and screening for cancer for at least 20 years after your treatment. If Pap tests have been discontinued, risk factors (such as a new sexual partner) need to be reassessed to determine if screening should be resumed. Some women have medical problems that increase the chance of getting cervical cancer. In these cases, your caregiver may recommend more frequent screening and Pap tests.  The human papillomavirus (HPV) test is an additional test that may be used for cervical cancer screening. The HPV test looks for the virus that can cause the cell changes on the cervix. The cells collected during the Pap test can be tested for HPV. The HPV test could be used to screen women aged 29 years and older, and should be used in women of any age who have unclear Pap test results. After the age of 82, women should have HPV testing at the same frequency as a Pap  test.  Colorectal cancer can be detected and often prevented. Most routine colorectal cancer screening begins at the age of 46 and continues through age 15. However, your caregiver may recommend screening at an earlier age if you have risk factors for colon cancer. On a yearly basis, your caregiver may provide home test kits to check for hidden blood in the stool. Use of a small camera at the end of a tube, to directly examine the colon (sigmoidoscopy or colonoscopy), can detect the earliest forms of colorectal cancer. Talk to your caregiver about this at age 44, when routine screening begins. Direct examination of the colon should be repeated every 5 to 10 years through age 30, unless early forms of pre-cancerous polyps or small growths are found.  Hepatitis C blood testing is recommended for all people born from [birth year] through 1967 and any individual with known risks for hepatitis C.  Practice safe sex. Use condoms and avoid high-risk sexual practices to reduce the spread of sexually transmitted infections (STIs). Sexually active women aged 76 and younger should be checked for Chlamydia, which is a common sexually transmitted infection. Older women with new or multiple partners should also be tested for Chlamydia. Testing for other STIs is recommended if you are sexually active and at increased risk.  Osteoporosis is a disease in which the bones lose minerals and strength with aging. This can result in serious bone fractures. The risk of osteoporosis can be identified using a bone density scan. Women ages 38 and over and women at risk for fractures or osteoporosis should discuss screening with their caregivers. Ask your caregiver whether you should be taking a calcium supplement or vitamin D to reduce the rate of osteoporosis.  Menopause can be associated with physical symptoms and risks. Hormone replacement therapy is available to decrease symptoms and risks. You should talk to your caregiver about  whether hormone replacement therapy is right for you.  Use sunscreen. Apply sunscreen liberally and repeatedly throughout the day. You should seek shade when your shadow is shorter than you. Protect yourself by wearing long sleeves, pants, a wide-brimmed hat, and sunglasses year round, whenever you are outdoors.  Notify your caregiver of new moles or changes in moles, especially if there is a change in shape or color. Also notify your caregiver if a mole is larger than the size of a pencil eraser.  Stay current with your immunizations. Document Released: 08/01/2010 Document Revised: 05/13/2012 Document Reviewed: 08/01/2010 [facility] Patient Information 2014 [place], [place]. Exercise to Lose Weight Exercise and a healthy diet may help you  lose weight. Your doctor may suggest specific exercises. EXERCISE IDEAS AND TIPS  Choose low-cost things you enjoy doing, such as walking, bicycling, or exercising to workout videos.  Take stairs instead of the elevator.  Walk during your lunch break.  Park your car further away from work or school.  Go to a gym or an exercise class.  Start with 5 to 10 minutes of exercise each day. Build up to 30 minutes of exercise 4 to 6 days a week.  Wear shoes with good support and comfortable clothes.  Stretch before and after working out.  Work out until you breathe harder and your heart beats faster.  Drink extra water when you exercise.  Do not do so much that you hurt yourself, feel dizzy, or get very short of breath. Exercises that burn about 150 calories:  Running 1  miles in 15 minutes.  Playing volleyball for 45 to 60 minutes.  Washing and waxing a car for 45 to 60 minutes.  Playing touch football for 45 minutes.  Walking 1  miles in 35 minutes.  Pushing a stroller 1  miles in 30 minutes.  Playing basketball for 30 minutes.  Raking leaves for 30 minutes.  Bicycling 5 miles in 30 minutes.  Walking 2 miles in 30 minutes.  Dancing  for 30 minutes.  Shoveling snow for 15 minutes.  Swimming laps for 20 minutes.  Walking up stairs for 15 minutes.  Bicycling 4 miles in 15 minutes.  Gardening for 30 to 45 minutes.  Jumping rope for 15 minutes.  Washing windows or floors for 45 to 60 minutes. Document Released: 02/18/2010 Document Revised: 04/10/2011 Document Reviewed: 02/18/2010 [facility] Patient Information 2014 [place], [place].

## 2013-01-16 ENCOUNTER — Encounter: Payer: Self-pay | Admitting: Internal Medicine

## 2013-01-16 DIAGNOSIS — E039 Hypothyroidism, unspecified: Secondary | ICD-10-CM | POA: Insufficient documentation

## 2013-01-16 MED ORDER — LEVOTHYROXINE SODIUM 50 MCG PO TABS: 50.0000 ug | ORAL_TABLET | Freq: Every day | ORAL | Status: DC

## 2013-01-16 NOTE — Addendum Note (Signed)
Addended by: Rene Paci A on: 01/16/2013 09:12 PM   Modules accepted: Orders

## 2013-03-11 LAB — COLOGUARD: Cologuard: NEGATIVE

## 2013-03-11 LAB — HM COLONOSCOPY: HM Colonoscopy: NEGATIVE

## 2013-03-24 ENCOUNTER — Encounter: Payer: Self-pay | Admitting: Internal Medicine

## 2013-04-21 ENCOUNTER — Encounter: Payer: Self-pay | Admitting: Internal Medicine

## 2013-10-30 ENCOUNTER — Other Ambulatory Visit: Payer: Self-pay

## 2013-10-30 DIAGNOSIS — Z1231 Encounter for screening mammogram for malignant neoplasm of breast: Secondary | ICD-10-CM

## 2013-11-19 ENCOUNTER — Ambulatory Visit
Admission: RE | Admit: 2013-11-19 | Discharge: 2013-11-19 | Disposition: A | Discharge status: Home / Self Care | Payer: 59 | Source: Ambulatory Visit

## 2013-11-19 DIAGNOSIS — Z1231 Encounter for screening mammogram for malignant neoplasm of breast: Secondary | ICD-10-CM

## 2014-01-15 ENCOUNTER — Ambulatory Visit (INDEPENDENT_AMBULATORY_CARE_PROVIDER_SITE_OTHER): Payer: Medicare Other | Admitting: Internal Medicine

## 2014-01-15 ENCOUNTER — Encounter: Payer: Self-pay | Admitting: Internal Medicine

## 2014-01-15 ENCOUNTER — Other Ambulatory Visit (INDEPENDENT_AMBULATORY_CARE_PROVIDER_SITE_OTHER): Payer: Medicare Other

## 2014-01-15 ENCOUNTER — Ambulatory Visit (INDEPENDENT_AMBULATORY_CARE_PROVIDER_SITE_OTHER)
Admission: RE | Admit: 2014-01-15 | Discharge: 2014-01-15 | Disposition: A | Discharge status: Home / Self Care | Payer: Medicare Other | Source: Ambulatory Visit | Attending: Internal Medicine | Admitting: Internal Medicine

## 2014-01-15 VITALS — BP 130/82 | HR 93 | Temp 98.1°F | Ht 63.0 in | Wt 250.8 lb

## 2014-01-15 DIAGNOSIS — E039 Hypothyroidism, unspecified: Secondary | ICD-10-CM

## 2014-01-15 DIAGNOSIS — Z13820 Encounter for screening for osteoporosis: Secondary | ICD-10-CM

## 2014-01-15 DIAGNOSIS — R7989 Other specified abnormal findings of blood chemistry: Secondary | ICD-10-CM

## 2014-01-15 DIAGNOSIS — I10 Essential (primary) hypertension: Secondary | ICD-10-CM

## 2014-01-15 DIAGNOSIS — E669 Obesity, unspecified: Secondary | ICD-10-CM

## 2014-01-15 DIAGNOSIS — R945 Abnormal results of liver function studies: Secondary | ICD-10-CM

## 2014-01-15 DIAGNOSIS — Z0000 Encounter for general adult medical examination without abnormal findings: Secondary | ICD-10-CM

## 2014-01-15 LAB — BASIC METABOLIC PANEL
BUN: 17 mg/dL (ref 6–23)
CO2: 24 mEq/L (ref 19–32)
Calcium: 9.6 mg/dL (ref 8.4–10.5)
Chloride: 104 mEq/L (ref 96–112)
Creatinine, Ser: 0.9 mg/dL (ref 0.4–1.2)
GFR: 67.64 mL/min (ref >60.00)
Glucose, Bld: 118 mg/dL — ABNORMAL HIGH (ref 70–99)
Potassium: 4.4 mEq/L (ref 3.5–5.1)
Sodium: 140 mEq/L (ref 135–145)

## 2014-01-15 LAB — CBC WITH DIFFERENTIAL/PLATELET
Basophils Absolute: 0 10*3/uL (ref 0.0–0.1)
Basophils Relative: 0.1 % (ref 0.0–3.0)
Eosinophils Absolute: 0.2 10*3/uL (ref 0.0–0.7)
Eosinophils Relative: 2.5 % (ref 0.0–5.0)
HCT: 41.6 % (ref 36.0–46.0)
Hemoglobin: 14 g/dL (ref 12.0–15.0)
Lymphocytes Relative: 30.9 % (ref 12.0–46.0)
Lymphs Abs: 2.6 10*3/uL (ref 0.7–4.0)
MCHC: 33.6 g/dL (ref 30.0–36.0)
MCV: 90.6 fl (ref 78.0–100.0)
Monocytes Absolute: 0.8 10*3/uL (ref 0.1–1.0)
Monocytes Relative: 9.1 % (ref 3.0–12.0)
Neutro Abs: 4.8 10*3/uL (ref 1.4–7.7)
Neutrophils Relative %: 57.4 % (ref 43.0–77.0)
Platelets: 231 10*3/uL (ref 150.0–400.0)
RBC: 4.59 Mil/uL (ref 3.87–5.11)
RDW: 14 % (ref 11.5–15.5)
WBC: 8.4 10*3/uL (ref 4.0–10.5)

## 2014-01-15 LAB — URINALYSIS, ROUTINE W REFLEX MICROSCOPIC
Bilirubin Urine: NEGATIVE
Hgb urine dipstick: NEGATIVE
Ketones, ur: NEGATIVE
Leukocytes, UA: NEGATIVE
Nitrite: NEGATIVE
RBC / HPF: NONE SEEN (ref 0–?)
Specific Gravity, Urine: 1.025 (ref 1.000–1.030)
Total Protein, Urine: NEGATIVE
Urine Glucose: NEGATIVE
Urobilinogen, UA: 0.2 (ref 0.0–1.0)
WBC, UA: NONE SEEN (ref 0–?)
pH: 5.5 (ref 5.0–8.0)

## 2014-01-15 LAB — HEPATIC FUNCTION PANEL
ALT: 41 U/L — ABNORMAL HIGH (ref 0–35)
AST: 40 U/L — ABNORMAL HIGH (ref 0–37)
Albumin: 4.1 g/dL (ref 3.5–5.2)
Alkaline Phosphatase: 53 U/L (ref 39–117)
Bilirubin, Direct: 0.1 mg/dL (ref 0.0–0.3)
Total Bilirubin: 0.7 mg/dL (ref 0.2–1.2)
Total Protein: 7.5 g/dL (ref 6.0–8.3)

## 2014-01-15 LAB — LIPID PANEL
Cholesterol: 166 mg/dL (ref 0–200)
HDL: 41.1 mg/dL (ref >39.00)
LDL Cholesterol: 98 mg/dL (ref 0–99)
NonHDL: 124.9
Total CHOL/HDL Ratio: 4
Triglycerides: 134 mg/dL (ref 0.0–149.0)
VLDL: 26.8 mg/dL (ref 0.0–40.0)

## 2014-01-15 LAB — TSH: TSH: 8.88 u[IU]/mL — ABNORMAL HIGH (ref 0.35–4.50)

## 2014-01-15 NOTE — Assessment & Plan Note (Signed)
Transient and incidental 12/2010 - resolved on recheck 12/2010 Hep C Ab neg and Abd [place] with fatty liver, no mass or stones No GI symptoms  Recheck LFTs now to monitor

## 2014-01-15 NOTE — Assessment & Plan Note (Signed)
Wt Readings from Last 3 Encounters:  01/15/14 250 lb 12 oz (113.739 kg)  01/15/13 253 lb (114.76 kg)  01/11/12 242 lb 6.4 oz (109.952 kg)   Reviewed importance of healthy weight to medical control of issues such as hypertension Advise slow initiation of aerobic program on treadmill, 15 minutes 3 times a week working up to 30 minutes 5 times a week Also reviewed heart healthy diet and importance of low sodium in relation to co-diagnosis hypertension We'll continue to follow, counsel and work with patient on same

## 2014-01-15 NOTE — Assessment & Plan Note (Signed)
Declined to start med as rx'd 12/2012 due to concern for daily pill Reviewed symptoms and risks of nontx Recheck labs and initiate tx as needed - pt expressed understanding and consent to same

## 2014-01-15 NOTE — Patient Instructions (Addendum)
It was good to see you today.  We have reviewed your prior records including labs and tests today  Health Maintenance reviewed - least consider your immunizations as discussed including annual flu shot, pneumonia vaccination and tetanus update every 10 years - other recommended age-appropriate screenings are up-to-date.  Test(s) ordered today. Your results will be released to Adin (or called to you) after review, usually within 72hours after test completion. If any changes need to be made, you will be notified at that same time.  Medications reviewed and updated,  We'll resume thyroid medication as needed based on labs  Work on lifestyle changes as discussed (low fat, low carb, increased protein diet; improved exercise efforts; weight loss) to control sugar, blood pressure and cholesterol levels and/or reduce risk of developing other medical problems. Look into [email or web address] or other type of food journal to assist you in this process.  Please schedule followup in 12 months for annual exam and labs, call sooner if problems.   Health Maintenance Adopting a healthy lifestyle and getting preventive care can go a long way to promote health and wellness. Talk with your health care provider about what schedule of regular examinations is right for you. This is a good chance for you to check in with your provider about disease prevention and staying healthy. In between checkups, there are plenty of things you can do on your own. Experts have done a lot of research about which lifestyle changes and preventive measures are most likely to keep you healthy. Ask your health care provider for more information. WEIGHT AND DIET  Eat a healthy diet  Be sure to include plenty of vegetables, fruits, low-fat dairy products, and lean protein.  Do not eat a lot of foods high in solid fats, added sugars, or salt.  Get regular exercise. This is one of the most important things you can do for your  health.  Most adults should exercise for at least 150 minutes each week. The exercise should increase your heart rate and make you sweat (moderate-intensity exercise).  Most adults should also do strengthening exercises at least twice a week. This is in addition to the moderate-intensity exercise.  Maintain a healthy weight  Body mass index (BMI) is a measurement that can be used to identify possible weight problems. It estimates body fat based on height and weight. Your health care provider can help determine your BMI and help you achieve or maintain a healthy weight.  For females 10 years of age and older:   A BMI below 18.5 is considered underweight.  A BMI of 18.5 to 24.9 is normal.  A BMI of 25 to 29.9 is considered overweight.  A BMI of 30 and above is considered obese.  Watch levels of cholesterol and blood lipids  You should start having your blood tested for lipids and cholesterol at 65 years of age, then have this test every 5 years.  You may need to have your cholesterol levels checked more often if:  Your lipid or cholesterol levels are high.  You are older than 65 years of age.  You are at high risk for heart disease.  CANCER SCREENING   Lung Cancer  Lung cancer screening is recommended for adults 41-87 years old who are at high risk for lung cancer because of a history of smoking.  A yearly low-dose CT scan of the lungs is recommended for people who:  Currently smoke.  Have quit within the past 15 years.  Have at least  a 30-pack-year history of smoking. A pack year is smoking an average of one pack of cigarettes a day for 1 year.  Yearly screening should continue until it has been 15 years since you quit.  Yearly screening should stop if you develop a health problem that would prevent you from having lung cancer treatment.  Breast Cancer  Practice breast self-awareness. This means understanding how your breasts normally appear and feel.  It also  means doing regular breast self-exams. Let your health care provider know about any changes, no matter how small.  If you are in your 20s or 30s, you should have a clinical breast exam (CBE) by a health care provider every 1-3 years as part of a regular health exam.  If you are 35 or older, have a CBE every year. Also consider having a breast X-ray (mammogram) every year.  If you have a family history of breast cancer, talk to your health care provider about genetic screening.  If you are at high risk for breast cancer, talk to your health care provider about having an MRI and a mammogram every year.  Breast cancer gene (BRCA) assessment is recommended for women who have family members with BRCA-related cancers. BRCA-related cancers include:  Breast.  Ovarian.  Tubal.  Peritoneal cancers.  Results of the assessment will determine the need for genetic counseling and BRCA1 and BRCA2 testing. Cervical Cancer Routine pelvic examinations to screen for cervical cancer are no longer recommended for nonpregnant women who are considered low risk for cancer of the pelvic organs (ovaries, uterus, and vagina) and who do not have symptoms. A pelvic examination may be necessary if you have symptoms including those associated with pelvic infections. Ask your health care provider if a screening pelvic exam is right for you.   The Pap test is the screening test for cervical cancer for women who are considered at risk.  If you had a hysterectomy for a problem that was not cancer or a condition that could lead to cancer, then you no longer need Pap tests.  If you are older than 65 years, and you have had normal Pap tests for the past 10 years, you no longer need to have Pap tests.  If you have had past treatment for cervical cancer or a condition that could lead to cancer, you need Pap tests and screening for cancer for at least 20 years after your treatment.  If you no longer get a Pap test, assess  your risk factors if they change (such as having a new sexual partner). This can affect whether you should start being screened again.  Some women have medical problems that increase their chance of getting cervical cancer. If this is the case for you, your health care provider may recommend more frequent screening and Pap tests.  The human papillomavirus (HPV) test is another test that may be used for cervical cancer screening. The HPV test looks for the virus that can cause cell changes in the cervix. The cells collected during the Pap test can be tested for HPV.  The HPV test can be used to screen women 33 years of age and older. Getting tested for HPV can extend the interval between normal Pap tests from three to five years.  An HPV test also should be used to screen women of any age who have unclear Pap test results.  After 65 years of age, women should have HPV testing as often as Pap tests.  Colorectal Cancer  This  type of cancer can be detected and often prevented.  Routine colorectal cancer screening usually begins at 65 years of age and continues through 65 years of age.  Your health care provider may recommend screening at an earlier age if you have risk factors for colon cancer.  Your health care provider may also recommend using home test kits to check for hidden blood in the stool.  A small camera at the end of a tube can be used to examine your colon directly (sigmoidoscopy or colonoscopy). This is done to check for the earliest forms of colorectal cancer.  Routine screening usually begins at age 8.  Direct examination of the colon should be repeated every 5-10 years through 65 years of age. However, you may need to be screened more often if early forms of precancerous polyps or small growths are found. Skin Cancer  Check your skin from head to toe regularly.  Tell your health care provider about any new moles or changes in moles, especially if there is a change in a  mole's shape or color.  Also tell your health care provider if you have a mole that is larger than the size of a pencil eraser.  Always use sunscreen. Apply sunscreen liberally and repeatedly throughout the day.  Protect yourself by wearing long sleeves, pants, a wide-brimmed hat, and sunglasses whenever you are outside. HEART DISEASE, DIABETES, AND HIGH BLOOD PRESSURE   Have your blood pressure checked at least every 1-2 years. High blood pressure causes heart disease and increases the risk of stroke.  If you are between 29 years and 77 years old, ask your health care provider if you should take aspirin to prevent strokes.  Have regular diabetes screenings. This involves taking a blood sample to check your fasting blood sugar level.  If you are at a normal weight and have a low risk for diabetes, have this test once every three years after 65 years of age.  If you are overweight and have a high risk for diabetes, consider being tested at a younger age or more often. PREVENTING INFECTION  Hepatitis B  If you have a higher risk for hepatitis B, you should be screened for this virus. You are considered at high risk for hepatitis B if:  You were born in a country where hepatitis B is common. Ask your health care provider which countries are considered high risk.  Your parents were born in a high-risk country, and you have not been immunized against hepatitis B (hepatitis B vaccine).  You have HIV or AIDS.  You use needles to inject street drugs.  You live with someone who has hepatitis B.  You have had sex with someone who has hepatitis B.  You get hemodialysis treatment.  You take certain medicines for conditions, including cancer, organ transplantation, and autoimmune conditions. Hepatitis C  Blood testing is recommended for:  Everyone born from [birth year] through 1965.  Anyone with known risk factors for hepatitis C. Sexually transmitted infections (STIs)  You should be  screened for sexually transmitted infections (STIs) including gonorrhea and chlamydia if:  You are sexually active and are younger than 65 years of age.  You are older than 65 years of age and your health care provider tells you that you are at risk for this type of infection.  Your sexual activity has changed since you were last screened and you are at an increased risk for chlamydia or gonorrhea. Ask your health care provider if you are at  risk.  If you do not have HIV, but are at risk, it may be recommended that you take a prescription medicine daily to prevent HIV infection. This is called pre-exposure prophylaxis (PrEP). You are considered at risk if:  You are sexually active and do not regularly use condoms or know the HIV status of your partner(s).  You take drugs by injection.  You are sexually active with a partner who has HIV. Talk with your health care provider about whether you are at high risk of being infected with HIV. If you choose to begin PrEP, you should first be tested for HIV. You should then be tested every 3 months for as long as you are taking PrEP.  PREGNANCY   If you are premenopausal and you may become pregnant, ask your health care provider about preconception counseling.  If you may become pregnant, take 400 to 800 micrograms (mcg) of folic acid every day.  If you want to prevent pregnancy, talk to your health care provider about birth control (contraception). OSTEOPOROSIS AND MENOPAUSE   Osteoporosis is a disease in which the bones lose minerals and strength with aging. This can result in serious bone fractures. Your risk for osteoporosis can be identified using a bone density scan.  If you are 46 years of age or older, or if you are at risk for osteoporosis and fractures, ask your health care provider if you should be screened.  Ask your health care provider whether you should take a calcium or vitamin D supplement to lower your risk for  osteoporosis.  Menopause may have certain physical symptoms and risks.  Hormone replacement therapy may reduce some of these symptoms and risks. Talk to your health care provider about whether hormone replacement therapy is right for you.  HOME CARE INSTRUCTIONS   Schedule regular health, dental, and eye exams.  Stay current with your immunizations.   Do not use any tobacco products including cigarettes, chewing tobacco, or electronic cigarettes.  If you are pregnant, do not drink alcohol.  If you are breastfeeding, limit how much and how often you drink alcohol.  Limit alcohol intake to no more than 1 drink per day for nonpregnant women. One drink equals 12 ounces of beer, 5 ounces of wine, or 1 ounces of hard liquor.  Do not use street drugs.  Do not share needles.  Ask your health care provider for help if you need support or information about quitting drugs.  Tell your health care provider if you often feel depressed.  Tell your health care provider if you have ever been abused or do not feel safe at home. Document Released: 08/01/2010 Document Revised: 06/02/2013 Document Reviewed: 12/18/2012 [facility] Patient Information 2015 [place], [place]. This information is not intended to replace advice given to you by your health care provider. Make sure you discuss any questions you have with your health care provider.

## 2014-01-15 NOTE — Progress Notes (Signed)
Pre visit review using our clinic review tool, if applicable. No additional management support is needed unless otherwise documented below in the visit note. 

## 2014-01-15 NOTE — Assessment & Plan Note (Signed)
Strong FH same Started ARB 12/2010>> stopped 10/2011 due to rash side effects BP improved with diet changes - ok to remain off meds - home BP log reviewed Continue monitor and will call if SBP>130 at home  BP Readings from Last 3 Encounters:  01/15/14 130/82  01/15/13 128/88  01/11/12 130/82

## 2014-01-15 NOTE — Progress Notes (Signed)
Subjective:    Patient ID: Samantha Lutz, female    DOB: 1950/07/21, 63 y.o.   MRN: [id number]  HPI  Patient is here for "welcome to medicare" evaluation.  also reviewed chronic medical issues and interval medical events  Past Medical History  Diagnosis Date  . Hypertension     diet controlled  . Lichen sclerosus     steroids as needed  . Hypothyroid 01/16/2013 dx  . Fatty liver     ultrasound December 2012   Family History  Problem Relation Age of Onset  . Diabetes Mother   . Stroke Maternal Grandmother 91  . Hypertension Mother   . Arthritis Other   . Hypertension Daughter   . Hypertension Maternal Grandmother   . COPD Mother    History  Substance Use Topics  . Smoking status: Never Smoker   . Smokeless tobacco: Not on file  . Alcohol Use: No    Review of Systems  Constitutional: Negative for fatigue and unexpected weight change.  Respiratory: Negative for cough, shortness of breath and wheezing.   Cardiovascular: Negative for chest pain, palpitations and leg swelling.  Gastrointestinal: Negative for nausea, abdominal pain and diarrhea.  Neurological: Negative for dizziness, weakness, light-headedness and headaches.  Psychiatric/Behavioral: Negative for dysphoric mood. The patient is not nervous/anxious.   All other systems reviewed and are negative.      Objective:   Physical Exam  BP 168/100 mmHg  Pulse 93  Temp(Src) 98.1 F (36.7 C) (Oral)  Ht 5\' 3"  (1.6 m)  Wt 250 lb 12 oz (113.739 kg)  BMI 44.43 kg/m2  SpO2 97% Wt Readings from Last 3 Encounters:  01/15/14 250 lb 12 oz (113.739 kg)  01/15/13 253 lb (114.76 kg)  01/11/12 242 lb 6.4 oz (109.952 kg)   Constitutional: She is overweight, appears well-developed and well-nourished. No distress.  HENT: Head: Normocephalic and atraumatic. Ears: B TMs ok, no erythema or effusion; Nose: Nose normal. Mouth/Throat: Oropharynx is clear and moist. No oropharyngeal exudate.  Eyes: Conjunctivae and EOM  are normal. Pupils are equal, round, and reactive to light. No scleral icterus.  Neck: Normal range of motion. Neck supple. No JVD present. No thyromegaly present.  Cardiovascular: Normal rate, regular rhythm and normal heart sounds.  No murmur heard. No BLE edema. Pulmonary/Chest: Effort normal and breath sounds normal. No respiratory distress. She has no wheezes.  Abdominal: Soft. Bowel sounds are normal. She exhibits no distension. There is no tenderness. no masses GU/breast: defer to gyn Musculoskeletal: Normal range of motion, no joint effusions. No gross deformities Neurological: She is alert and oriented to person, place, and time. No cranial nerve deficit. Coordination, balance, strength, speech and gait are normal.  Skin: Skin is warm and dry. No rash noted. No erythema.  Psychiatric: She has a normal mood and affect. Her behavior is normal. Judgment and thought content normal.    Lab Results  Component Value Date   WBC 10.6* 01/15/2013   HGB 14.3 01/15/2013   HCT 41.0 01/15/2013   PLT 237.0 01/15/2013   GLUCOSE 87 01/15/2013   CHOL 180 01/15/2013   TRIG 182.0* 01/15/2013   HDL 39.10 01/15/2013   LDLCALC 105* 01/15/2013   ALT 37* 01/15/2013   AST 41* 01/15/2013   NA 140 01/15/2013   K 4.8 01/15/2013   CL 105 01/15/2013   CREATININE 0.9 01/15/2013   BUN 12 01/15/2013   CO2 28 01/15/2013   TSH 8.43* 01/15/2013   HGBA1C 6.1 01/15/2013  Mm Screening Breast Tomo Bilateral  11/19/2013   CLINICAL DATA:  Screening.  EXAM: DIGITAL SCREENING BILATERAL MAMMOGRAM WITH 3D TOMO WITH CAD  COMPARISON:  Previous exam(s).  ACR Breast Density Category b: There are scattered areas of fibroglandular density.  FINDINGS: There are no findings suspicious for malignancy. Images were processed with CAD.  IMPRESSION: No mammographic evidence of malignancy. A result letter of this screening mammogram will be mailed directly to the patient.  RECOMMENDATION: Screening mammogram in one year.  (Code:SM-B-01Y)  BI-RADS CATEGORY  1: Negative.   Electronically Signed   By: Skipper Cliche M.D.   On: 11/19/2013 14:17       Assessment & Plan:   Welcome to Medicare -[id number] Patient is here for "welcome to medicare" evaluation. Risk factors for depression per USPSTF are reviewed and negative. Patient hearing function is screened today; ADLs are reviewed and addressed as needed; functional ability and  level of safety have been reviewed and are appropriate. Education, counseling, and referrals are performed based on age appropriate health issues. Patient has information regarding separate preventitive health services covered by medicare part b.  Problem List Items Addressed This Visit    Elevated LFTs    Transient and incidental 12/2010 - resolved on recheck 12/2010 Hep C Ab neg and Abd [place] with fatty liver, no mass or stones No GI symptoms  Recheck LFTs now to monitor    Relevant Orders      CBC with Differential      Hepatic function panel   Hypertension (Chronic)    Strong FH same Started ARB 12/2010>> stopped 10/2011 due to rash side effects BP improved with diet changes - ok to remain off meds - home BP log reviewed Continue monitor and will call if SBP>130 at home  BP Readings from Last 3 Encounters:  01/15/14 130/82  01/15/13 128/88  01/11/12 130/82      Relevant Orders      Basic metabolic panel      CBC with Differential      Hepatic function panel      Lipid panel      Urinalysis, Routine w reflex microscopic   Hypothyroid    Declined to start med as rx'd 12/2012 due to concern for daily pill Reviewed symptoms and risks of nontx Recheck labs and initiate tx as needed - pt expressed understanding and consent to same    Relevant Orders      CBC with Differential      Lipid panel      TSH      DG Bone Density   Obese (Chronic)    Wt Readings from Last 3 Encounters:  01/15/14 250 lb 12 oz (113.739 kg)  01/15/13 253 lb (114.76 kg)  01/11/12 242 lb 6.4 oz  (109.952 kg)   Reviewed importance of healthy weight to medical control of issues such as hypertension Advise slow initiation of aerobic program on treadmill, 15 minutes 3 times a week working up to 30 minutes 5 times a week Also reviewed heart healthy diet and importance of low sodium in relation to co-diagnosis hypertension We'll continue to follow, counsel and work with patient on same    Relevant Orders      CBC with Differential    Other Visit Diagnoses    Routine general medical examination at a health care facility    -  Primary    Screening for osteoporosis        Relevant Orders  DG Bone Density

## 2014-07-27 ENCOUNTER — Other Ambulatory Visit: Payer: Self-pay

## 2014-11-13 ENCOUNTER — Other Ambulatory Visit: Payer: Self-pay

## 2014-11-13 DIAGNOSIS — Z1231 Encounter for screening mammogram for malignant neoplasm of breast: Secondary | ICD-10-CM

## 2014-12-18 ENCOUNTER — Ambulatory Visit
Admission: RE | Admit: 2014-12-18 | Discharge: 2014-12-18 | Disposition: A | Discharge status: Home / Self Care | Payer: Medicare Other | Source: Ambulatory Visit

## 2014-12-18 DIAGNOSIS — Z1231 Encounter for screening mammogram for malignant neoplasm of breast: Secondary | ICD-10-CM

## 2015-01-04 ENCOUNTER — Encounter: Payer: Self-pay | Admitting: Internal Medicine

## 2015-01-04 ENCOUNTER — Ambulatory Visit (INDEPENDENT_AMBULATORY_CARE_PROVIDER_SITE_OTHER): Payer: Medicare Other | Admitting: Internal Medicine

## 2015-01-04 ENCOUNTER — Other Ambulatory Visit (INDEPENDENT_AMBULATORY_CARE_PROVIDER_SITE_OTHER): Payer: Medicare Other

## 2015-01-04 VITALS — BP 130/80 | HR 79 | Temp 98.4°F | Ht 63.0 in | Wt 250.5 lb

## 2015-01-04 DIAGNOSIS — I10 Essential (primary) hypertension: Secondary | ICD-10-CM | POA: Diagnosis not present

## 2015-01-04 DIAGNOSIS — Z23 Encounter for immunization: Secondary | ICD-10-CM

## 2015-01-04 DIAGNOSIS — R7989 Other specified abnormal findings of blood chemistry: Secondary | ICD-10-CM

## 2015-01-04 DIAGNOSIS — Z0000 Encounter for general adult medical examination without abnormal findings: Secondary | ICD-10-CM

## 2015-01-04 DIAGNOSIS — R945 Abnormal results of liver function studies: Secondary | ICD-10-CM

## 2015-01-04 DIAGNOSIS — E039 Hypothyroidism, unspecified: Secondary | ICD-10-CM

## 2015-01-04 DIAGNOSIS — E669 Obesity, unspecified: Secondary | ICD-10-CM | POA: Diagnosis not present

## 2015-01-04 LAB — URINALYSIS, ROUTINE W REFLEX MICROSCOPIC
Bilirubin Urine: NEGATIVE
Hgb urine dipstick: NEGATIVE
Ketones, ur: NEGATIVE
Leukocytes, UA: NEGATIVE
Nitrite: NEGATIVE
RBC / HPF: NONE SEEN (ref 0–?)
Specific Gravity, Urine: 1.025 (ref 1.000–1.030)
Total Protein, Urine: NEGATIVE
Urine Glucose: NEGATIVE
Urobilinogen, UA: 0.2 (ref 0.0–1.0)
WBC, UA: NONE SEEN (ref 0–?)
pH: 5 (ref 5.0–8.0)

## 2015-01-04 LAB — BASIC METABOLIC PANEL
BUN: 14 mg/dL (ref 6–23)
CO2: 26 mEq/L (ref 19–32)
Calcium: 9.4 mg/dL (ref 8.4–10.5)
Chloride: 105 mEq/L (ref 96–112)
Creatinine, Ser: 0.8 mg/dL (ref 0.40–1.20)
GFR: 76.27 mL/min (ref >60.00)
Glucose, Bld: 114 mg/dL — ABNORMAL HIGH (ref 70–99)
Potassium: 4.4 mEq/L (ref 3.5–5.1)
Sodium: 141 mEq/L (ref 135–145)

## 2015-01-04 LAB — CBC WITH DIFFERENTIAL/PLATELET
Basophils Absolute: 0 10*3/uL (ref 0.0–0.1)
Basophils Relative: 0.3 % (ref 0.0–3.0)
Eosinophils Absolute: 0.2 10*3/uL (ref 0.0–0.7)
Eosinophils Relative: 2.4 % (ref 0.0–5.0)
HCT: 41.4 % (ref 36.0–46.0)
Hemoglobin: 14 g/dL (ref 12.0–15.0)
Lymphocytes Relative: 34.3 % (ref 12.0–46.0)
Lymphs Abs: 2.5 10*3/uL (ref 0.7–4.0)
MCHC: 33.8 g/dL (ref 30.0–36.0)
MCV: 90.8 fl (ref 78.0–100.0)
Monocytes Absolute: 0.7 10*3/uL (ref 0.1–1.0)
Monocytes Relative: 10 % (ref 3.0–12.0)
Neutro Abs: 3.9 10*3/uL (ref 1.4–7.7)
Neutrophils Relative %: 53 % (ref 43.0–77.0)
Platelets: 207 10*3/uL (ref 150.0–400.0)
RBC: 4.57 Mil/uL (ref 3.87–5.11)
RDW: 13.5 % (ref 11.5–15.5)
WBC: 7.4 10*3/uL (ref 4.0–10.5)

## 2015-01-04 LAB — LIPID PANEL
Cholesterol: 158 mg/dL (ref 0–200)
HDL: 43 mg/dL (ref >39.00)
LDL Cholesterol: 93 mg/dL (ref 0–99)
NonHDL: 114.91
Total CHOL/HDL Ratio: 4
Triglycerides: 112 mg/dL (ref 0.0–149.0)
VLDL: 22.4 mg/dL (ref 0.0–40.0)

## 2015-01-04 LAB — HEPATIC FUNCTION PANEL
ALT: 31 U/L (ref 0–35)
AST: 28 U/L (ref 0–37)
Albumin: 4.1 g/dL (ref 3.5–5.2)
Alkaline Phosphatase: 55 U/L (ref 39–117)
Bilirubin, Direct: 0.1 mg/dL (ref 0.0–0.3)
Total Bilirubin: 0.6 mg/dL (ref 0.2–1.2)
Total Protein: 7.3 g/dL (ref 6.0–8.3)

## 2015-01-04 LAB — TSH: TSH: 10.61 u[IU]/mL — ABNORMAL HIGH (ref 0.35–4.50)

## 2015-01-04 NOTE — Assessment & Plan Note (Signed)
Wt Readings from Last 3 Encounters:  01/04/15 250 lb 8 oz (113.626 kg)  01/15/14 250 lb 12 oz (113.739 kg)  01/15/13 253 lb (114.76 kg)   Reviewed importance of healthy weight to medical control of issues such as hypertension Advise slow initiation of aerobic program on treadmill, 15 minutes 3 times a week working up to 30 minutes 5 times a week Also reviewed heart healthy diet and importance of low sodium in relation to co-diagnosis hypertension We'll continue to follow, counsel and work with patient on same

## 2015-01-04 NOTE — Assessment & Plan Note (Signed)
Declined to start med as rx'd 12/2012 due to concern for daily pill and creating "dependency"  Again declined to start replacement 12/2013 with TSH reconfirmed dx Reviewed symptoms and risks of nontx Recheck labs and initiate tx as needed - pt expressed understanding and consent to same

## 2015-01-04 NOTE — Assessment & Plan Note (Signed)
Transient and incidental 12/2010 - resolved on recheck 12/2010 Hep C Ab neg and Abd US with fatty liver, no mass or stones No GI symptoms  Recheck LFTs now to monitor 

## 2015-01-04 NOTE — Assessment & Plan Note (Signed)
Strong FH same Started ARB 12/2010>> stopped 10/2011 due to rash side effects BP improved with diet changes - ok to remain off meds - home BP log reviewed Continue monitor and will call if SBP>130 at home  BP Readings from Last 3 Encounters:  01/04/15 130/80  01/15/14 130/82  01/15/13 128/88

## 2015-01-04 NOTE — Progress Notes (Signed)
Subjective:    Patient ID: Samantha Lutz, female    DOB: 05-03-1950, 64 y.o.   MRN: [id number]  HPI   Here for medicare wellness  Diet: heart healthy and low carb Physical activity: sedentary Depression/mood screen: negative Hearing: intact to whispered voice Visual acuity: grossly normal, performs annual eye exam  ADLs: capable Fall risk: none Home safety: good Cognitive evaluation: intact to orientation, naming, recall and repetition EOL planning: adv directives, full code/ I agree  I have personally reviewed and have noted 1. The patient's medical and social history 2. Their use of alcohol, tobacco or illicit drugs 3. Their current medications and supplements 4. The patient's functional ability including ADL's, fall risks, home safety risks and hearing or visual impairment. 5. Diet and physical activities 6. Evidence for depression or mood disorders  Also reviewed chronic medical conditions, interval events and current concerns - still wishes to avoid any and all prescriptive medications as she prefers a "natural approach" to her healthcare   Past Medical History  Diagnosis Date  . Hypertension     diet controlled  . Lichen sclerosus     steroids as needed  . Hypothyroid 01/16/2013 dx  . Fatty liver     ultrasound December 2012   Family History  Problem Relation Age of Onset  . Diabetes Mother   . Stroke Maternal Grandmother 42  . Hypertension Mother   . Rheum arthritis Sister 66  . Hypertension Daughter   . Hypertension Maternal Grandmother   . COPD Mother   . Glaucoma Father 21   Social History  Substance Use Topics  . Smoking status: Never Smoker   . Smokeless tobacco: None  . Alcohol Use: No   Review of Systems  Constitutional: Negative for fatigue and unexpected weight change.  Respiratory: Negative for cough, shortness of breath and wheezing.   Cardiovascular: Negative for chest pain, palpitations and leg swelling.  Gastrointestinal:  Negative for nausea, abdominal pain and diarrhea.  Neurological: Negative for dizziness, weakness, light-headedness and headaches.  Psychiatric/Behavioral: Negative for dysphoric mood. The patient is not nervous/anxious.   All other systems reviewed and are negative.   Patient Care Team: Rowe Clack, MD as PCP - General (Internal Medicine) Delsa Bern, MD (Obstetrics and Gynecology) Melissa Noon, OD (Optometry)     Objective:    Physical Exam  Constitutional: She appears well-developed and well-nourished. No distress.  obese  Cardiovascular: Normal rate, regular rhythm and normal heart sounds.   No murmur heard. Pulmonary/Chest: Effort normal and breath sounds normal. No respiratory distress.  Musculoskeletal: She exhibits no edema.  Vitals reviewed.   BP 130/80 mmHg  Pulse 79  Temp(Src) 98.4 F (36.9 C) (Oral)  Ht 5\' 3"  (1.6 m)  Wt 250 lb 8 oz (113.626 kg)  BMI 44.39 kg/m2  SpO2 97% Wt Readings from Last 3 Encounters:  01/04/15 250 lb 8 oz (113.626 kg)  01/15/14 250 lb 12 oz (113.739 kg)  01/15/13 253 lb (114.76 kg)    Lab Results  Component Value Date   WBC 8.4 01/15/2014   HGB 14.0 01/15/2014   HCT 41.6 01/15/2014   PLT 231.0 01/15/2014   GLUCOSE 118* 01/15/2014   CHOL 166 01/15/2014   TRIG 134.0 01/15/2014   HDL 41.10 01/15/2014   LDLCALC 98 01/15/2014   ALT 41* 01/15/2014   AST 40* 01/15/2014   NA 140 01/15/2014   K 4.4 01/15/2014   CL 104 01/15/2014   CREATININE 0.9 01/15/2014   BUN 17 01/15/2014  CO2 24 01/15/2014   TSH 8.88* 01/15/2014   HGBA1C 6.1 01/15/2013    Mm Screening Breast Tomo Bilateral  12/21/2014  CLINICAL DATA:  Screening. EXAM: DIGITAL SCREENING BILATERAL MAMMOGRAM WITH 3D TOMO WITH CAD COMPARISON:  Previous exam(s). ACR Breast Density Category b: There are scattered areas of fibroglandular density. FINDINGS: There are no findings suspicious for malignancy. Images were processed with CAD. IMPRESSION: No mammographic  evidence of malignancy. A result letter of this screening mammogram will be mailed directly to the patient. RECOMMENDATION: Screening mammogram in one year. (Code:SM-B-01Y) BI-RADS CATEGORY  1: Negative. Electronically Signed   By: Fidela Salisbury M.D.   On: 12/21/2014 08:55       Assessment & Plan:   AWV/z00.00 - Today patient counseled on age appropriate routine health concerns for screening and prevention, each reviewed and up to date or declined. Immunizations reviewed and up to date or declined. Labs ordered and  reviewed. Risk factors for depression reviewed and negative. Hearing function and visual acuity are intact. ADLs screened and addressed as needed. Functional ability and level of safety reviewed and appropriate. Education, counseling and referrals performed based on assessed risks today. Patient provided with a copy of personalized plan for preventive services.   Problem List Items Addressed This Visit    Elevated LFTs    Transient and incidental 12/2010 - resolved on recheck 12/2010 Hep C Ab neg and Abd [place] with fatty liver, no mass or stones No GI symptoms  Recheck LFTs now to monitor      Hypertension (Chronic)    Strong FH same Started ARB 12/2010>> stopped 10/2011 due to rash side effects BP improved with diet changes - ok to remain off meds - home BP log reviewed Continue monitor and will call if SBP>130 at home  BP Readings from Last 3 Encounters:  01/04/15 130/80  01/15/14 130/82  01/15/13 128/88        Hypothyroid    Declined to start med as rx'd 12/2012 due to concern for daily pill and creating "dependency"  Again declined to start replacement 12/2013 with TSH reconfirmed dx Reviewed symptoms and risks of nontx Recheck labs and initiate tx as needed - pt expressed understanding and consent to same      Obese (Chronic)    Wt Readings from Last 3 Encounters:  01/04/15 250 lb 8 oz (113.626 kg)  01/15/14 250 lb 12 oz (113.739 kg)  01/15/13 253 lb  (114.76 kg)   Reviewed importance of healthy weight to medical control of issues such as hypertension Advise slow initiation of aerobic program on treadmill, 15 minutes 3 times a week working up to 30 minutes 5 times a week Also reviewed heart healthy diet and importance of low sodium in relation to co-diagnosis hypertension We'll continue to follow, counsel and work with patient on same       Other Visit Diagnoses    Routine general medical examination at a health care facility    -  Primary    Need for prophylactic vaccination with combined diphtheria-tetanus-pertussis (DTP) vaccine        Relevant Orders    Tdap vaccine greater than or equal to 7yo IM (Completed)        Gwendolyn Grant, MD

## 2015-01-04 NOTE — Addendum Note (Signed)
Addended by: Earnstine Regal on: 01/04/2015 11:22 AM   Modules accepted: Orders

## 2015-01-04 NOTE — Patient Instructions (Addendum)
It was good to see you today.  We have reviewed your prior records including labs and tests today  Tdap (tetanus diphtheria and pertussis ) 10 your immunization updated today. Annual flu shot and 5 year pneumonia vaccine declined. Other Health Maintenance reviewed and all other recommended immunizations and age-appropriate screenings are up-to-date.  Test(s) ordered today. Your results will be released to Samantha Lutz (or called to you) after review, usually within 72hours after test completion. If any changes need to be made, you will be notified at that same time.  Medications reviewed and updated, no changes recommended at this time.  Work on lifestyle changes as discussed (low fat, low carb, increased protein diet; improved exercise efforts; weight loss) to control sugar, blood pressure and cholesterol levels and/or reduce risk of developing other medical problems. Look into [email or web address] or other type of food journal to assist you in this process.  Please schedule followup in 12 months for annual exam and labs, call sooner if problems.  Health Maintenance, Female Adopting a healthy lifestyle and getting preventive care can go a long way to promote health and wellness. Talk with your health care provider about what schedule of regular examinations is right for you. This is a good chance for you to check in with your provider about disease prevention and staying healthy. In between checkups, there are plenty of things you can do on your own. Experts have done a lot of research about which lifestyle changes and preventive measures are most likely to keep you healthy. Ask your health care provider for more information. WEIGHT AND DIET  Eat a healthy diet  Be sure to include plenty of vegetables, fruits, low-fat dairy products, and lean protein.  Do not eat a lot of foods high in solid fats, added sugars, or salt.  Get regular exercise. This is one of the most important things you can do for  your health.  Most adults should exercise for at least 150 minutes each week. The exercise should increase your heart rate and make you sweat (moderate-intensity exercise).  Most adults should also do strengthening exercises at least twice a week. This is in addition to the moderate-intensity exercise.  Maintain a healthy weight  Body mass index (BMI) is a measurement that can be used to identify possible weight problems. It estimates body fat based on height and weight. Your health care provider can help determine your BMI and help you achieve or maintain a healthy weight.  For females 31 years of age and older:   A BMI below 18.5 is considered underweight.  A BMI of 18.5 to 24.9 is normal.  A BMI of 25 to 29.9 is considered overweight.  A BMI of 30 and above is considered obese.  Watch levels of cholesterol and blood lipids  You should start having your blood tested for lipids and cholesterol at 66 years of age, then have this test every 5 years.  You may need to have your cholesterol levels checked more often if:  Your lipid or cholesterol levels are high.  You are older than 66 years of age.  You are at high risk for heart disease.  CANCER SCREENING   Lung Cancer  Lung cancer screening is recommended for adults 65-8 years old who are at high risk for lung cancer because of a history of smoking.  A yearly low-dose CT scan of the lungs is recommended for people who:  Currently smoke.  Have quit within the past 15 years.  Have at  least a 30-pack-year history of smoking. A pack year is smoking an average of one pack of cigarettes a day for 1 year.  Yearly screening should continue until it has been 15 years since you quit.  Yearly screening should stop if you develop a health problem that would prevent you from having lung cancer treatment.  Breast Cancer  Practice breast self-awareness. This means understanding how your breasts normally appear and feel.  It  also means doing regular breast self-exams. Let your health care provider know about any changes, no matter how small.  If you are in your 20s or 30s, you should have a clinical breast exam (CBE) by a health care provider every 1-3 years as part of a regular health exam.  If you are 6 or older, have a CBE every year. Also consider having a breast X-ray (mammogram) every year.  If you have a family history of breast cancer, talk to your health care provider about genetic screening.  If you are at high risk for breast cancer, talk to your health care provider about having an MRI and a mammogram every year.  Breast cancer gene (BRCA) assessment is recommended for women who have family members with BRCA-related cancers. BRCA-related cancers include:  Breast.  Ovarian.  Tubal.  Peritoneal cancers.  Results of the assessment will determine the need for genetic counseling and BRCA1 and BRCA2 testing. Cervical Cancer Your health care provider may recommend that you be screened regularly for cancer of the pelvic organs (ovaries, uterus, and vagina). This screening involves a pelvic examination, including checking for microscopic changes to the surface of your cervix (Pap test). You may be encouraged to have this screening done every 3 years, beginning at age 27.  For women ages 6-65, health care providers may recommend pelvic exams and Pap testing every 3 years, or they may recommend the Pap and pelvic exam, combined with testing for human papilloma virus (HPV), every 5 years. Some types of HPV increase your risk of cervical cancer. Testing for HPV may also be done on women of any age with unclear Pap test results.  Other health care providers may not recommend any screening for nonpregnant women who are considered low risk for pelvic cancer and who do not have symptoms. Ask your health care provider if a screening pelvic exam is right for you.  If you have had past treatment for cervical cancer  or a condition that could lead to cancer, you need Pap tests and screening for cancer for at least 20 years after your treatment. If Pap tests have been discontinued, your risk factors (such as having a new sexual partner) need to be reassessed to determine if screening should resume. Some women have medical problems that increase the chance of getting cervical cancer. In these cases, your health care provider may recommend more frequent screening and Pap tests. Colorectal Cancer  This type of cancer can be detected and often prevented.  Routine colorectal cancer screening usually begins at 66 years of age and continues through 66 years of age.  Your health care provider may recommend screening at an earlier age if you have risk factors for colon cancer.  Your health care provider may also recommend using home test kits to check for hidden blood in the stool.  A small camera at the end of a tube can be used to examine your colon directly (sigmoidoscopy or colonoscopy). This is done to check for the earliest forms of colorectal cancer.  Routine screening  usually begins at age 40.  Direct examination of the colon should be repeated every 5-10 years through 66 years of age. However, you may need to be screened more often if early forms of precancerous polyps or small growths are found. Skin Cancer  Check your skin from head to toe regularly.  Tell your health care provider about any new moles or changes in moles, especially if there is a change in a mole's shape or color.  Also tell your health care provider if you have a mole that is larger than the size of a pencil eraser.  Always use sunscreen. Apply sunscreen liberally and repeatedly throughout the day.  Protect yourself by wearing long sleeves, pants, a wide-brimmed hat, and sunglasses whenever you are outside. HEART DISEASE, DIABETES, AND HIGH BLOOD PRESSURE   High blood pressure causes heart disease and increases the risk of stroke.  High blood pressure is more likely to develop in:  People who have blood pressure in the high end of the normal range (130-139/85-89 mm Hg).  People who are overweight or obese.  People who are African American.  If you are 18-25 years of age, have your blood pressure checked every 3-5 years. If you are 43 years of age or older, have your blood pressure checked every year. You should have your blood pressure measured twice--once when you are at a hospital or clinic, and once when you are not at a hospital or clinic. Record the average of the two measurements. To check your blood pressure when you are not at a hospital or clinic, you can use:  An automated blood pressure machine at a pharmacy.  A home blood pressure monitor.  If you are between 21 years and 74 years old, ask your health care provider if you should take aspirin to prevent strokes.  Have regular diabetes screenings. This involves taking a blood sample to check your fasting blood sugar level.  If you are at a normal weight and have a low risk for diabetes, have this test once every three years after 66 years of age.  If you are overweight and have a high risk for diabetes, consider being tested at a younger age or more often. PREVENTING INFECTION  Hepatitis B  If you have a higher risk for hepatitis B, you should be screened for this virus. You are considered at high risk for hepatitis B if:  You were born in a country where hepatitis B is common. Ask your health care provider which countries are considered high risk.  Your parents were born in a high-risk country, and you have not been immunized against hepatitis B (hepatitis B vaccine).  You have HIV or AIDS.  You use needles to inject street drugs.  You live with someone who has hepatitis B.  You have had sex with someone who has hepatitis B.  You get hemodialysis treatment.  You take certain medicines for conditions, including cancer, organ transplantation,  and autoimmune conditions. Hepatitis C  Blood testing is recommended for:  Everyone born from [birth year] through 1967.  Anyone with known risk factors for hepatitis C. Sexually transmitted infections (STIs)  You should be screened for sexually transmitted infections (STIs) including gonorrhea and chlamydia if:  You are sexually active and are younger than 66 years of age.  You are older than 66 years of age and your health care provider tells you that you are at risk for this type of infection.  Your sexual activity has changed since you  were last screened and you are at an increased risk for chlamydia or gonorrhea. Ask your health care provider if you are at risk.  If you do not have HIV, but are at risk, it may be recommended that you take a prescription medicine daily to prevent HIV infection. This is called pre-exposure prophylaxis (PrEP). You are considered at risk if:  You are sexually active and do not regularly use condoms or know the HIV status of your partner(s).  You take drugs by injection.  You are sexually active with a partner who has HIV. Talk with your health care provider about whether you are at high risk of being infected with HIV. If you choose to begin PrEP, you should first be tested for HIV. You should then be tested every 3 months for as long as you are taking PrEP.  PREGNANCY   If you are premenopausal and you may become pregnant, ask your health care provider about preconception counseling.  If you may become pregnant, take 400 to 800 micrograms (mcg) of folic acid every day.  If you want to prevent pregnancy, talk to your health care provider about birth control (contraception). OSTEOPOROSIS AND MENOPAUSE   Osteoporosis is a disease in which the bones lose minerals and strength with aging. This can result in serious bone fractures. Your risk for osteoporosis can be identified using a bone density scan.  If you are 43 years of age or older, or if you are at  risk for osteoporosis and fractures, ask your health care provider if you should be screened.  Ask your health care provider whether you should take a calcium or vitamin D supplement to lower your risk for osteoporosis.  Menopause may have certain physical symptoms and risks.  Hormone replacement therapy may reduce some of these symptoms and risks. Talk to your health care provider about whether hormone replacement therapy is right for you.  HOME CARE INSTRUCTIONS   Schedule regular health, dental, and eye exams.  Stay current with your immunizations.   Do not use any tobacco products including cigarettes, chewing tobacco, or electronic cigarettes.  If you are pregnant, do not drink alcohol.  If you are breastfeeding, limit how much and how often you drink alcohol.  Limit alcohol intake to no more than 1 drink per day for nonpregnant women. One drink equals 12 ounces of beer, 5 ounces of wine, or 1 ounces of hard liquor.  Do not use street drugs.  Do not share needles.  Ask your health care provider for help if you need support or information about quitting drugs.  Tell your health care provider if you often feel depressed.  Tell your health care provider if you have ever been abused or do not feel safe at home.   This information is not intended to replace advice given to you by your health care provider. Make sure you discuss any questions you have with your health care provider.   Document Released: 08/01/2010 Document Revised: 02/06/2014 Document Reviewed: 12/18/2012 Elsevier Interactive Patient Education [employer].

## 2015-01-04 NOTE — Progress Notes (Signed)
Pre visit review using our clinic review tool, if applicable. No additional management support is needed unless otherwise documented below in the visit note. 

## 2015-01-06 ENCOUNTER — Other Ambulatory Visit (INDEPENDENT_AMBULATORY_CARE_PROVIDER_SITE_OTHER): Payer: Medicare Other

## 2015-01-06 DIAGNOSIS — R7301 Impaired fasting glucose: Secondary | ICD-10-CM | POA: Diagnosis not present

## 2015-01-06 LAB — HEMOGLOBIN A1C: Hgb A1c MFr Bld: 5.8 % (ref 4.6–6.5)

## 2015-12-15 ENCOUNTER — Other Ambulatory Visit: Payer: Self-pay | Admitting: Obstetrics and Gynecology

## 2015-12-15 DIAGNOSIS — Z1231 Encounter for screening mammogram for malignant neoplasm of breast: Secondary | ICD-10-CM

## 2016-02-01 ENCOUNTER — Ambulatory Visit
Admission: RE | Admit: 2016-02-01 | Discharge: 2016-02-01 | Disposition: A | Discharge status: Home / Self Care | Payer: Medicare Other | Source: Ambulatory Visit | Attending: Obstetrics and Gynecology | Admitting: Obstetrics and Gynecology

## 2016-02-01 DIAGNOSIS — Z1231 Encounter for screening mammogram for malignant neoplasm of breast: Secondary | ICD-10-CM

## 2017-01-16 ENCOUNTER — Other Ambulatory Visit: Payer: Self-pay | Admitting: Obstetrics and Gynecology

## 2017-01-16 DIAGNOSIS — Z1231 Encounter for screening mammogram for malignant neoplasm of breast: Secondary | ICD-10-CM

## 2017-02-02 ENCOUNTER — Ambulatory Visit
Admission: RE | Admit: 2017-02-02 | Discharge: 2017-02-02 | Disposition: A | Discharge status: Home / Self Care | Payer: Medicare Other | Source: Ambulatory Visit | Attending: Obstetrics and Gynecology | Admitting: Obstetrics and Gynecology

## 2017-02-02 DIAGNOSIS — Z1231 Encounter for screening mammogram for malignant neoplasm of breast: Secondary | ICD-10-CM

## 2017-04-25 ENCOUNTER — Other Ambulatory Visit (INDEPENDENT_AMBULATORY_CARE_PROVIDER_SITE_OTHER): Payer: Medicare Other

## 2017-04-25 ENCOUNTER — Encounter: Payer: Self-pay | Admitting: Nurse Practitioner

## 2017-04-25 ENCOUNTER — Ambulatory Visit (INDEPENDENT_AMBULATORY_CARE_PROVIDER_SITE_OTHER): Payer: Medicare Other | Admitting: Nurse Practitioner

## 2017-04-25 VITALS — BP 156/96 | HR 93 | Temp 98.4°F | Resp 16 | Ht 63.0 in | Wt 251.0 lb

## 2017-04-25 DIAGNOSIS — Z13220 Encounter for screening for lipoid disorders: Secondary | ICD-10-CM

## 2017-04-25 DIAGNOSIS — Z0001 Encounter for general adult medical examination with abnormal findings: Secondary | ICD-10-CM | POA: Insufficient documentation

## 2017-04-25 DIAGNOSIS — I10 Essential (primary) hypertension: Secondary | ICD-10-CM

## 2017-04-25 DIAGNOSIS — E039 Hypothyroidism, unspecified: Secondary | ICD-10-CM | POA: Diagnosis not present

## 2017-04-25 DIAGNOSIS — Z1211 Encounter for screening for malignant neoplasm of colon: Secondary | ICD-10-CM | POA: Diagnosis not present

## 2017-04-25 DIAGNOSIS — R002 Palpitations: Secondary | ICD-10-CM | POA: Diagnosis not present

## 2017-04-25 DIAGNOSIS — R7303 Prediabetes: Secondary | ICD-10-CM

## 2017-04-25 LAB — COMPREHENSIVE METABOLIC PANEL
ALT: 41 U/L — ABNORMAL HIGH (ref 0–35)
AST: 43 U/L — ABNORMAL HIGH (ref 0–37)
Albumin: 3.9 g/dL (ref 3.5–5.2)
Alkaline Phosphatase: 59 U/L (ref 39–117)
BUN: 12 mg/dL (ref 6–23)
CO2: 26 mEq/L (ref 19–32)
Calcium: 9.1 mg/dL (ref 8.4–10.5)
Chloride: 105 mEq/L (ref 96–112)
Creatinine, Ser: 0.85 mg/dL (ref 0.40–1.20)
GFR: 70.62 mL/min (ref >60.00)
Glucose, Bld: 148 mg/dL — ABNORMAL HIGH (ref 70–99)
Potassium: 4.1 mEq/L (ref 3.5–5.1)
Sodium: 140 mEq/L (ref 135–145)
Total Bilirubin: 0.6 mg/dL (ref 0.2–1.2)
Total Protein: 7.5 g/dL (ref 6.0–8.3)

## 2017-04-25 LAB — LIPID PANEL
Cholesterol: 157 mg/dL (ref 0–200)
HDL: 42.6 mg/dL (ref >39.00)
LDL Cholesterol: 88 mg/dL (ref 0–99)
NonHDL: 114.18
Total CHOL/HDL Ratio: 4
Triglycerides: 132 mg/dL (ref 0.0–149.0)
VLDL: 26.4 mg/dL (ref 0.0–40.0)

## 2017-04-25 LAB — CBC
HCT: 40.3 % (ref 36.0–46.0)
Hemoglobin: 13.8 g/dL (ref 12.0–15.0)
MCHC: 34.4 g/dL (ref 30.0–36.0)
MCV: 88.3 fl (ref 78.0–100.0)
Platelets: 227 10*3/uL (ref 150.0–400.0)
RBC: 4.56 Mil/uL (ref 3.87–5.11)
RDW: 13.5 % (ref 11.5–15.5)
WBC: 8.6 10*3/uL (ref 4.0–10.5)

## 2017-04-25 LAB — HEMOGLOBIN A1C: Hgb A1c MFr Bld: 6.1 % (ref 4.6–6.5)

## 2017-04-25 LAB — TSH: TSH: 5.89 u[IU]/mL — ABNORMAL HIGH (ref 0.35–4.50)

## 2017-04-25 NOTE — Patient Instructions (Signed)
Please try to check your blood pressure once daily or at least a few times a week, at the same time each day, and keep a log. Please return in about 2 weeks for follow up of your blood pressure- bring your readings with you.  Please head downstairs for lab work.  Please work on healthy diet and exercise as we discussed. Remember half of your plate should be veggies, one-fourth carbs, one-fourth meat, and don't eat meat at every meal. Also, remember to stay away from sugary drinks. I'd like for you to start incorporating exercise into your daily schedule. Start at 10 minutes a day, working up to 30 minutes five times a week.   It was good to meet you. Thanks for letting me take care of you today :)   Preventive Care 69 Years and Older, Female Preventive care refers to lifestyle choices and visits with your health care provider that can promote health and wellness. What does preventive care include?  A yearly physical exam. This is also called an annual well check.  Dental exams once or twice a year.  Routine eye exams. Ask your health care provider how often you should have your eyes checked.  Personal lifestyle choices, including: ? Daily care of your teeth and gums. ? Regular physical activity. ? Eating a healthy diet. ? Avoiding tobacco and drug use. ? Limiting alcohol use. ? Practicing safe sex. ? Taking low-dose aspirin every day. ? Taking vitamin and mineral supplements as recommended by your health care provider. What happens during an annual well check? The services and screenings done by your health care provider during your annual well check will depend on your age, overall health, lifestyle risk factors, and family history of disease. Counseling Your health care provider may ask you questions about your:  Alcohol use.  Tobacco use.  Drug use.  Emotional well-being.  Home and relationship well-being.  Sexual activity.  Eating habits.  History of  falls.  Memory and ability to understand (cognition).  Work and work Statistician.  Reproductive health.  Screening You may have the following tests or measurements:  Height, weight, and BMI.  Blood pressure.  Lipid and cholesterol levels. These may be checked every 5 years, or more frequently if you are over 70 years old.  Skin check.  Lung cancer screening. You may have this screening every year starting at age 4 if you have a 30-pack-year history of smoking and currently smoke or have quit within the past 15 years.  Fecal occult blood test (FOBT) of the stool. You may have this test every year starting at age 67.  Flexible sigmoidoscopy or colonoscopy. You may have a sigmoidoscopy every 5 years or a colonoscopy every 10 years starting at age 65.  Hepatitis C blood test.  Hepatitis B blood test.  Sexually transmitted disease (STD) testing.  Diabetes screening. This is done by checking your blood sugar (glucose) after you have not eaten for a while (fasting). You may have this done every 1-3 years.  Bone density scan. This is done to screen for osteoporosis. You may have this done starting at age 19.  Mammogram. This may be done every 1-2 years. Talk to your health care provider about how often you should have regular mammograms.  Talk with your health care provider about your test results, treatment options, and if necessary, the need for more tests. Vaccines Your health care provider may recommend certain vaccines, such as:  Influenza vaccine. This is recommended every year.  Tetanus, diphtheria, and acellular pertussis (Tdap, Td) vaccine. You may need a Td booster every 10 years.  Varicella vaccine. You may need this if you have not been vaccinated.  Zoster vaccine. You may need this after age 22.  Measles, mumps, and rubella (MMR) vaccine. You may need at least one dose of MMR if you were born in 1957 or later. You may also need a second dose.  Pneumococcal  13-valent conjugate (PCV13) vaccine. One dose is recommended after age 40.  Pneumococcal polysaccharide (PPSV23) vaccine. One dose is recommended after age 11.  Meningococcal vaccine. You may need this if you have certain conditions.  Hepatitis A vaccine. You may need this if you have certain conditions or if you travel or work in places where you may be exposed to hepatitis A.  Hepatitis B vaccine. You may need this if you have certain conditions or if you travel or work in places where you may be exposed to hepatitis B.  Haemophilus influenzae type b (Hib) vaccine. You may need this if you have certain conditions.  Talk to your health care provider about which screenings and vaccines you need and how often you need them. This information is not intended to replace advice given to you by your health care provider. Make sure you discuss any questions you have with your health care provider. Document Released: 02/12/2015 Document Revised: 10/06/2015 Document Reviewed: 11/17/2014 Elsevier Interactive Patient Education  [employer].

## 2017-04-25 NOTE — Assessment & Plan Note (Signed)
-  USPSTF grade A and B recommendations reviewed with patient; age-appropriate recommendations, preventive care, screening tests, etc discussed and encouraged; healthy living encouraged; see AVS for patient education given to patient -Discussed importance of 150 minutes of physical activity weekly, eat 6 servings of fruit/vegetables daily and drink plenty of water and avoid sweet beverages.  -Red flags and when to present for emergency care or RTC including fever >101.42F, chest pain, shortness of breath, new/worsening/un-resolving symptoms, reviewed with patient at time of visit. Follow up and care instructions discussed and provided in AVS.  -Reviewed Health Maintenance: Colon cancer screening Requests cologuard today. We discussed checking with insurance for coverage first but she would like to go ahead and order- she is fairly certain her insurance will cover as they have in the past and tells me that she is not worried about how much it costs even after I told her it could be thousands of dollars without coverage - Cologuard  Screening for cholesterol level - Lipid panel; Future Prediabetes Noted on chart review Update labs today - Hemoglobin A1c; Future

## 2017-04-25 NOTE — Assessment & Plan Note (Signed)
She was maintained on synthroid in past but says she has been out for quite some time-maybe over a year and was not able to schedule a F/U appointment until now Update labs today - TSH; Future

## 2017-04-25 NOTE — Assessment & Plan Note (Signed)
BP elevated on 2 readings in the office today with reportedly normal readings at home She will monitor BP at home and RTC in 2 weeks with BP log - asked to bring her home monitor to F/U visit for calibration If her BP remains elevated we will need to discuss resuming BP medication Discussed the role of healthy diet and exercise in the management of HTN - Comprehensive metabolic panel; Future - CBC; Fut[facility]

## 2017-04-25 NOTE — Progress Notes (Signed)
Name: Samantha Lutz   MRN: [id number]    DOB: 1950-04-08   Date:04/25/2017       Progress Note  Subjective  Chief Complaint  Chief Complaint  Patient presents with  . Establish Care    due for cologuard, CPE, not fasting, needs thyroid checked has not had meds in a year    HPI  Patient is transferring care to me today from another provider who has left our clinic, she was last here in 2016. Patient presents for annual CPE.  Diet: she reports daily vegetables; cooks at home normally but more take-out recently; not a water drinker-mostly drinks tea and diet soda Exercise: no routine exercise but active with grandchildren  USPSTF grade A and B recommendations  Depression: no concerns for anxiety and depression Depression screen [facility] 2/9 04/25/2017 01/04/2015 01/15/2014 01/15/2013 01/11/2012  Decreased Interest 0 0 0 0 0  Down, Depressed, Hopeless 0 0 0 0 0  PHQ - 2 Score 0 0 0 0 0   Hypertension: She is not currently maintained on medications for HTN - was treated at one point with medication but has been off medications for years due to normal readings She feels her high BP is related to her anxiety about coming in for appointment today Her daughter is a Marine scientist and she routinely checks BP at home- 120s/70s normally BP Readings from Last 3 Encounters:  04/25/17 (!) 156/96  01/04/15 130/80  01/15/14 130/82   Obesity: Wt Readings from Last 3 Encounters:  04/25/17 251 lb (113.9 kg)  01/04/15 250 lb 8 oz (113.6 kg)  01/15/14 250 lb 12 oz (113.7 kg)   BMI Readings from Last 3 Encounters:  04/25/17 44.46 kg/m  01/04/15 44.37 kg/m  01/15/14 44.42 kg/m    Alcohol: no Tobacco use: no, never Intimate partner violence: feels safe at home Sexual History/Pain during Intercourse: not currently sexually active Menstrual History/LMP/Abnormal Bleeding: denies abnormal vaginal discharge or bleeding, postmenopausal; previously followed with GYN for routine womens care Incontinence  Symptoms: denies   Vaccinations: declines PNA, influenza  Advanced Care Planning: A voluntary discussion about advance care planning including the explanation and discussion of advance directives.  Discussed health care proxy and Living will, and the patient DOES NOT have a living will at present time. If patient does have living will, I have requested they bring this to the clinic to be scanned in to their chart.  Breast cancer: mammogram up to date  Osteoporosis: normal BMD 2015- taking OTC calcium and vitamin D  Lipids:  Lab Results  Component Value Date   CHOL 158 01/04/2015   CHOL 166 01/15/2014   CHOL 180 01/15/2013   Lab Results  Component Value Date   HDL 43.00 01/04/2015   HDL 41.10 01/15/2014   HDL 39.10 01/15/2013   Lab Results  Component Value Date   LDLCALC 93 01/04/2015   LDLCALC 98 01/15/2014   LDLCALC 105 (H) 01/15/2013   Lab Results  Component Value Date   TRIG 112.0 01/04/2015   TRIG 134.0 01/15/2014   TRIG 182.0 (H) 01/15/2013   Lab Results  Component Value Date   CHOLHDL 4 01/04/2015   CHOLHDL 4 01/15/2014   CHOLHDL 5 01/15/2013   No results found for: LDLDIRECT  Glucose:  Glucose, Bld  Date Value Ref Range Status  01/04/2015 114 (H) 70 - 99 mg/dL Final  01/15/2014 118 (H) 70 - 99 mg/dL Final  01/15/2013 87 70 - 99 mg/dL Final    Skin cancer: no concerning  lesions or moles Colorectal cancer: cologuard due- we will order today. no abdominal pain, no bowel changes, no rectal bleeding  Aspirin: not indicated ECG: will update today  Patient Active Problem List   Diagnosis Date Noted  . Hypothyroid 01/16/2013  . Right knee pain   . Elevated LFTs 01/03/2011  . Hypertension 12/19/2010  . Obese     Past Surgical History:  Procedure Laterality Date  . APPENDECTOMY  1967  . CESAREAN SECTION  1976 & 1983    Family History  Problem Relation Age of Onset  . Diabetes Mother   . Hypertension Mother   . COPD Mother   . Glaucoma Father  70  . Stroke Maternal Grandmother 67  . Hypertension Maternal Grandmother   . Rheum arthritis Sister 41  . Hypertension Daughter     Social History   Socioeconomic History  . Marital status: Married    Spouse name: Not on file  . Number of children: Not on file  . Years of education: Not on file  . Highest education level: Not on file  Occupational History  . Not on file  Social Needs  . Financial resource strain: Not on file  . Food insecurity:    Worry: Not on file    Inability: Not on file  . Transportation needs:    Medical: Not on file    Non-medical: Not on file  Tobacco Use  . Smoking status: Never Smoker  . Smokeless tobacco: Never Used  Substance and Sexual Activity  . Alcohol use: No  . Drug use: No  . Sexual activity: Not on file  Lifestyle  . Physical activity:    Days per week: Not on file    Minutes per session: Not on file  . Stress: Not on file  Relationships  . Social connections:    Talks on phone: Not on file    Gets together: Not on file    Attends religious service: Not on file    Active member of club or organization: Not on file    Attends meetings of clubs or organizations: Not on file    Relationship status: Not on file  . Intimate partner violence:    Fear of current or ex partner: Not on file    Emotionally abused: Not on file    Physically abused: Not on file    Forced sexual activity: Not on file  Other Topics Concern  . Not on file  Social History Narrative  . Not on file     Current Outpatient Medications:  Marland Kitchen  Multiple Vitamin (MULTIVITAMIN) capsule, Take 1 capsule by mouth daily., Disp: , Rfl:   Allergies  Allergen Reactions  . Losartan Hives     ROS  Constitutional: Negative for fever or weight change.  Respiratory: Negative for cough and shortness of breath.   Cardiovascular: Negative for chest pain. Positive for palpiations. Gastrointestinal: Negative for abdominal pain, no bowel changes.  Musculoskeletal:  Negative for gait problem or falls. Neurological: Negative for dizziness or headache.  No other specific complaints in a complete review of systems (except as listed in HPI above).  Palpitations- This is not a new problem  The palpations began about 20 years ago and have occurred intermittently since She had a cardiac workup for the palpitations about 20 years ago when they first started and she was told that there were no problems with her heart. She was not instructed for further F/U with cardiology  Objective  Vitals:  04/25/17 1553 04/25/17 1649  BP: (!) 158/92 (!) 156/96  Pulse: 93   Resp: 16   Temp: 98.4 F (36.9 C)   TempSrc: Oral   SpO2: 98%   Weight: 251 lb (113.9 kg)   Height: 5\' 3"  (1.6 m)     Body mass index is 44.46 kg/m.  Physical Exam Vital signs reviewed. Constitutional: Patient appears well-developed and well-nourished. Obese. No distress.  HENT: Head: Normocephalic and atraumatic. Ears: B TMs ok, no erythema or effusion; Nose: Nose normal. Mouth/Throat: Oropharynx is clear and moist. No oropharyngeal exudate.  Eyes: Conjunctivae and EOM are normal. Pupils are equal, round, and reactive to light. No scleral icterus.  Neck: Normal range of motion. Neck supple. No cervical adenopathy. No thyromegaly present.  Cardiovascular: Normal rate, regular rhythm and normal heart sounds.  No murmur heard. No BLE edema. Pulmonary/Chest: Effort normal and breath sounds normal. No respiratory distress. Abdominal: Soft. Bowel sounds are normal, no distension. There is no tenderness. no masses Musculoskeletal: . No gross deformities Neurological: She is alert and oriented to person, place, and time. No cranial nerve deficit. Coordination, balance, strength, speech and gait are normal.  Skin: Skin is warm and dry. No rash noted. No erythema.  Psychiatric: Patient has a normal mood and affect. behavior is normal.   Assessment & Plan RTC in 2 weeks F/U of  HTN  Palpitations Will update labs and EKG today and F/U with further recommendations once labs result - TSH; Future - Comprehensive metabolic panel; Future - CBC; Future - EKG 12-Lead: I have personally reviewed the EKG tracing and agree with computerized printout as noted: sinus rhythm, appears unchanged compared to prior EKG on 01/15/2013

## 2017-05-03 ENCOUNTER — Other Ambulatory Visit: Payer: Self-pay | Admitting: Family

## 2017-05-03 MED ORDER — LEVOTHYROXINE SODIUM 50 MCG PO TABS: 50.0000 ug | ORAL_TABLET | Freq: Every day | ORAL | 0 refills | Status: DC

## 2017-05-16 ENCOUNTER — Other Ambulatory Visit (INDEPENDENT_AMBULATORY_CARE_PROVIDER_SITE_OTHER): Payer: Medicare Other

## 2017-05-16 ENCOUNTER — Ambulatory Visit (INDEPENDENT_AMBULATORY_CARE_PROVIDER_SITE_OTHER): Payer: Medicare Other | Admitting: Family

## 2017-05-16 ENCOUNTER — Encounter: Payer: Self-pay | Admitting: Family

## 2017-05-16 VITALS — BP 132/80 | HR 84 | Temp 98.5°F | Ht 63.0 in | Wt 250.0 lb

## 2017-05-16 DIAGNOSIS — E039 Hypothyroidism, unspecified: Secondary | ICD-10-CM | POA: Diagnosis not present

## 2017-05-16 DIAGNOSIS — R030 Elevated blood-pressure reading, without diagnosis of hypertension: Secondary | ICD-10-CM | POA: Diagnosis not present

## 2017-05-16 DIAGNOSIS — E7800 Pure hypercholesterolemia, unspecified: Secondary | ICD-10-CM | POA: Diagnosis not present

## 2017-05-16 LAB — TSH: TSH: 9.55 u[IU]/mL — ABNORMAL HIGH (ref 0.35–4.50)

## 2017-05-16 NOTE — Progress Notes (Signed)
Samantha Lutz is a 67 y.o. female with the following history as recorded in [employer]:  Patient Active Problem List   Diagnosis Date Noted  . Encounter for general adult medical examination with abnormal findings 04/25/2017  . Hypothyroid 01/16/2013  . Right knee pain   . Elevated LFTs 01/03/2011  . Hypertension 12/19/2010  . Obese     Current Outpatient Medications  Medication Sig Dispense Refill  . levothyroxine (SYNTHROID, LEVOTHROID) 50 MCG tablet Take 1 tablet (50 mcg total) by mouth daily. 90 tablet 0  . Multiple Vitamin (MULTIVITAMIN) capsule Take 1 capsule by mouth daily.     No current facility-administered medications for this visit.     Allergies: Losartan  Past Medical History:  Diagnosis Date  . Fatty liver    ultrasound December 2012  . Hypertension    diet controlled  . Hypothyroid 01/16/2013 dx  . Lichen sclerosus    steroids as needed    Past Surgical History:  Procedure Laterality Date  . APPENDECTOMY  1967  . CESAREAN SECTION  1976 & 1983    Family History  Problem Relation Age of Onset  . Diabetes Mother   . Hypertension Mother   . COPD Mother   . Glaucoma Father 33  . Stroke Maternal Grandmother 56  . Hypertension Maternal Grandmother   . Rheum arthritis Sister 34  . Hypertension Daughter     Social History   Tobacco Use  . Smoking status: Never Smoker  . Smokeless tobacco: Never Used  Substance Use Topics  . Alcohol use: No    Subjective:   3 week follow-up from recent CPE; blood pressure was noted to be elevated and opted to monitor instead of starting medication; has been checking regularly and averaging 130/70 at home; Denies any chest pain, shortness of breath, blurred vision or headache Needs to follow-up on recent re-start of Synthroid; tolerating well;  Would like to review labs drawn at recent CPE; was told her cholesterol was elevated; notes she does not want to take any type of medication;    Objective:  Vitals:   05/16/17 1130  BP: 132/80  Pulse: 84  Temp: 98.5 F (36.9 C)  TempSrc: Oral  SpO2: 95%  Weight: 250 lb (113.4 kg)  Height: 5\' 3"  (1.6 m)    General: Well developed, well nourished, in no acute distress  Skin : Warm and dry.  Head: Normocephalic and atraumatic  Eyes: Sclera and conjunctiva clear; pupils round and reactive to light; extraocular movements intact  Neck: Supple without thyromegaly, adenopathy  Lungs: Respirations unlabored; clear to auscultation bilaterally without wheeze, rales, rhonchi  CVS exam: normal rate and regular rhythm.  Neurologic: Alert and oriented; speech intact; face symmetrical; moves all extremities well; CNII-XII intact without focal deficit  Assessment:  1. Hypothyroidism, unspecified type   2. Elevated blood pressure, situational   3. Elevated LDL cholesterol level     Plan:  1. Check TSh today; continue Synthroid and plan to re-check labs in about 6 months; 2. Suspect white coat situation at last OV; normal today and readings at home have been normal; continue to monitor 2-3 x per month; 3. ASCVD score at 7.9%- she defers medication; dietary changes discussed; re-check in 6 months;  Discussed need for Prevnar, Pneumovax, Shingrix- she defers vaccines today;    Return in about 6 months (around 11/15/2017) for labs for repeat thyroid and cholesterol check.  Orders Placed This Encounter  Procedures  . TSH    Standing Status:  Future    Number of Occurrences:   1    Standing Expiration Date:   05/16/2018    Requested Prescriptions    No prescriptions requested or ordered in this encounter

## 2017-05-16 NOTE — Patient Instructions (Signed)
Fat and Cholesterol Restricted Diet Getting too much fat and cholesterol in your diet may cause health problems. Following this diet helps keep your fat and cholesterol at normal levels. This can keep you from getting sick. What types of fat should I choose?  Choose monosaturated and polyunsaturated fats. These are found in foods such as olive oil, canola oil, flaxseeds, walnuts, almonds, and seeds.  Eat more omega-3 fats. Good choices include salmon, mackerel, sardines, tuna, flaxseed oil, and ground flaxseeds.  Limit saturated fats. These are in animal products such as meats, butter, and cream. They can also be in plant products such as palm oil, palm kernel oil, and coconut oil.  Avoid foods with partially hydrogenated oils in them. These contain trans fats. Examples of foods that have trans fats are stick margarine, some tub margarines, cookies, crackers, and other baked goods. What general guidelines do I need to follow?  Check food labels. Look for the words "trans fat" and "saturated fat."  When preparing a meal: ? Fill half of your plate with vegetables and green salads. ? Fill one fourth of your plate with whole grains. Look for the word "whole" as the first word in the ingredient list. ? Fill one fourth of your plate with lean protein foods.  Eat more foods that have fiber, like apples, carrots, beans, peas, and barley.  Eat more home-cooked foods. Eat less at restaurants and buffets.  Limit or avoid alcohol.  Limit foods high in starch and sugar.  Limit fried foods.  Cook foods without frying them. Baking, boiling, grilling, and broiling are all great options.  Lose weight if you are overweight. Losing even a small amount of weight can help your overall health. It can also help prevent diseases such as diabetes and heart disease. What foods can I eat? Grains Whole grains, such as whole wheat or whole grain breads, crackers, cereals, and pasta. Unsweetened oatmeal,  bulgur, barley, quinoa, or brown rice. Corn or whole wheat flour tortillas. Vegetables Fresh or frozen vegetables (raw, steamed, roasted, or grilled). Green salads. Fruits All fresh, canned (in natural juice), or frozen fruits. Meat and Other Protein Products Ground beef (85% or leaner), grass-fed beef, or beef trimmed of fat. Skinless chicken or turkey. Ground chicken or turkey. Pork trimmed of fat. All fish and seafood. Eggs. Dried beans, peas, or lentils. Unsalted nuts or seeds. Unsalted canned or dry beans. Dairy Low-fat dairy products, such as skim or 1% milk, 2% or reduced-fat cheeses, low-fat ricotta or cottage cheese, or plain low-fat yogurt. Fats and Oils Tub margarines without trans fats. Light or reduced-fat mayonnaise and salad dressings. Avocado. Olive, canola, sesame, or safflower oils. Natural peanut or almond butter (choose ones without added sugar and oil). The items listed above may not be a complete list of recommended foods or beverages. Contact your dietitian for more options. What foods are not recommended? Grains White bread. White pasta. White rice. Cornbread. Bagels, pastries, and croissants. Crackers that contain trans fat. Vegetables White potatoes. Corn. Creamed or fried vegetables. Vegetables in a cheese sauce. Fruits Dried fruits. Canned fruit in light or heavy syrup. Fruit juice. Meat and Other Protein Products Fatty cuts of meat. Ribs, chicken wings, bacon, sausage, bologna, salami, chitterlings, fatback, hot dogs, bratwurst, and packaged luncheon meats. Liver and organ meats. Dairy Whole or 2% milk, cream, half-and-half, and cream cheese. Whole milk cheeses. Whole-fat or sweetened yogurt. Full-fat cheeses. Nondairy creamers and whipped toppings. Processed cheese, cheese spreads, or cheese curds. Sweets and Desserts Corn   syrup, sugars, honey, and molasses. Candy. Jam and jelly. Syrup. Sweetened cereals. Cookies, pies, cakes, donuts, muffins, and ice  cream. Fats and Oils Butter, stick margarine, lard, shortening, ghee, or bacon fat. Coconut, palm kernel, or palm oils. Beverages Alcohol. Sweetened drinks (such as sodas, lemonade, and fruit drinks or punches). The items listed above may not be a complete list of foods and beverages to avoid. Contact your dietitian for more information. This information is not intended to replace advice given to you by your health care provider. Make sure you discuss any questions you have with your health care provider. Document Released: 07/18/2011 Document Revised: 09/23/2015 Document Reviewed: 04/17/2013 Elsevier Interactive Patient Education  2018 [employer]  

## 2017-05-22 LAB — COLOGUARD: Cologuard: POSITIVE

## 2017-06-01 ENCOUNTER — Telehealth: Payer: Self-pay

## 2017-06-01 ENCOUNTER — Other Ambulatory Visit: Payer: Self-pay | Admitting: Family

## 2017-06-01 DIAGNOSIS — R195 Other fecal abnormalities: Secondary | ICD-10-CM

## 2017-06-01 NOTE — Telephone Encounter (Signed)
LVM for pt to call back in regards to recent cologuard results.

## 2017-06-01 NOTE — Telephone Encounter (Signed)
Pt has been informed of the positive cologuard results and is aware she is needing a colonoscopy. Would you like me to place the referral? Thank you.

## 2017-06-04 ENCOUNTER — Encounter: Payer: Self-pay | Admitting: Gastroenterology

## 2017-06-05 ENCOUNTER — Encounter: Payer: Self-pay | Admitting: Nurse Practitioner

## 2017-07-05 ENCOUNTER — Encounter: Payer: Self-pay | Admitting: Nurse Practitioner

## 2017-07-05 DIAGNOSIS — R195 Other fecal abnormalities: Secondary | ICD-10-CM

## 2017-07-11 ENCOUNTER — Other Ambulatory Visit: Payer: Self-pay

## 2017-07-26 ENCOUNTER — Ambulatory Visit (AMBULATORY_SURGERY_CENTER): Payer: Self-pay

## 2017-07-26 ENCOUNTER — Other Ambulatory Visit: Payer: Self-pay

## 2017-07-26 VITALS — Ht 63.0 in | Wt 248.8 lb

## 2017-07-26 DIAGNOSIS — R195 Other fecal abnormalities: Secondary | ICD-10-CM

## 2017-07-26 MED ORDER — NA SULFATE-K SULFATE-MG SULF 17.5-3.13-1.6 GM/177ML PO SOLN: 1.0000 | Freq: Once | ORAL | 0 refills | Status: AC

## 2017-07-26 NOTE — Progress Notes (Signed)
Denies allergies to eggs or soy products. Denies complication of anesthesia or sedation. Denies use of weight loss medication. Denies use of O2.   Emmi instructions declined.  

## 2017-07-27 ENCOUNTER — Encounter: Payer: Self-pay | Admitting: Gastroenterology

## 2017-07-30 DIAGNOSIS — C801 Malignant (primary) neoplasm, unspecified: Secondary | ICD-10-CM

## 2017-07-30 HISTORY — DX: Malignant (primary) neoplasm, unspecified: C80.1

## 2017-08-09 ENCOUNTER — Ambulatory Visit (AMBULATORY_SURGERY_CENTER): Payer: Medicare Other | Admitting: Gastroenterology

## 2017-08-09 ENCOUNTER — Encounter: Payer: Self-pay | Admitting: Gastroenterology

## 2017-08-09 ENCOUNTER — Other Ambulatory Visit: Payer: Self-pay

## 2017-08-09 VITALS — BP 155/75 | HR 79 | Temp 98.6°F | Resp 11 | Ht 63.0 in | Wt 248.0 lb

## 2017-08-09 DIAGNOSIS — R195 Other fecal abnormalities: Secondary | ICD-10-CM | POA: Diagnosis present

## 2017-08-09 DIAGNOSIS — K6289 Other specified diseases of anus and rectum: Secondary | ICD-10-CM

## 2017-08-09 DIAGNOSIS — D120 Benign neoplasm of cecum: Secondary | ICD-10-CM | POA: Diagnosis not present

## 2017-08-09 DIAGNOSIS — D122 Benign neoplasm of ascending colon: Secondary | ICD-10-CM | POA: Diagnosis not present

## 2017-08-09 DIAGNOSIS — C20 Malignant neoplasm of rectum: Secondary | ICD-10-CM

## 2017-08-09 HISTORY — PX: COLONOSCOPY: SHX174

## 2017-08-09 MED ORDER — SODIUM CHLORIDE 0.9 % IV SOLN: 500.0000 mL | INTRAVENOUS | Status: DC

## 2017-08-09 NOTE — Progress Notes (Signed)
Called to room to assist during endoscopic procedure.  Patient ID and intended procedure confirmed with present staff. Received instructions for my participation in the procedure from the performing physician.  All specimens sent [facility]. [facility] called at )[phone] to pick up specimen.

## 2017-08-09 NOTE — Patient Instructions (Addendum)
YOU HAD AN ENDOSCOPIC PROCEDURE TODAY AT [facility]:   Refer to the procedure report that was given to you for any specific questions about what was found during the examination.  If the procedure report does not answer your questions, please call your gastroenterologist to clarify.  If you requested that your care partner not be given the details of your procedure findings, then the procedure report has been included in a sealed envelope for you to review at your convenience later.  YOU SHOULD EXPECT: Some feelings of bloating in the abdomen. Passage of more gas than usual.  Walking can help get rid of the air that was put into your GI tract during the procedure and reduce the bloating. If you had a lower endoscopy (such as a colonoscopy or flexible sigmoidoscopy) you may notice spotting of blood in your stool or on the toilet paper. If you underwent a bowel prep for your procedure, you may not have a normal bowel movement for a few days.  Please Note:  You might notice some irritation and congestion in your nose or some drainage.  This is from the oxygen used during your procedure.  There is no need for concern and it should clear up in a day or so.  SYMPTOMS TO REPORT IMMEDIATELY:   Following lower endoscopy (colonoscopy or flexible sigmoidoscopy):  Excessive amounts of blood in the stool  Significant tenderness or worsening of abdominal pains  Swelling of the abdomen that is new, acute  Fever of 100F or higher   For urgent or emergent issues, a gastroenterologist can be reached at any hour by calling [phone].   DIET:  We do recommend a small meal at first, but then you may proceed to your regular diet.  Drink plenty of fluids but you should avoid alcoholic beverages for 24 hours.  ACTIVITY:  You should plan to take it easy for the rest of today and you should NOT DRIVE or use heavy machinery until tomorrow (because of the sedation medicines used during the test).     FOLLOW UP: Our staff will call the number listed on your records the next business day following your procedure to check on you and address any questions or concerns that you may have regarding the information given to you following your procedure. If we do not reach you, we will leave a message.  However, if you are feeling well and you are not experiencing any problems, there is no need to return our call.  We will assume that you have returned to your regular daily activities without incident.  If any biopsies were taken you will be contacted by phone or by letter within the next 1-3 weeks.  Please call us at [phone] if you have not heard about the biopsies in 3 weeks.    SIGNATURES/CONFIDENTIALITY: You and/or your care partner have signed paperwork which will be entered into your electronic medical record.  These signatures attest to the fact that that the information above on your After Visit Summary has been reviewed and is understood.  Full responsibility of the confidentiality of this discharge information lies with you and/or your care-partner.  One of our doctors will call you with the pathology results when they come in.  Be sure to take your miralax as directed.

## 2017-08-09 NOTE — Progress Notes (Addendum)
Pt's b/p is elevated.  She reported no hx of hnt.  Pt states, :I am nervous".  162/128 rt, 218/103 lt arm.  Lafe Garin, CRNA was made aware of elevated b/p. Maw  Alphonsa Gin, RN took b/p manually 170/100 on rt ARM. She will report to to [facility], CRNA. Maw  Per Lafe Garin, CRNA ok to proceed with colonoscopy. maw

## 2017-08-09 NOTE — Progress Notes (Signed)
Pt's states no medical or surgical changes since previsit or office visit. 

## 2017-08-09 NOTE — Progress Notes (Signed)
Report given to PACU, vss 

## 2017-08-09 NOTE — Op Note (Addendum)
[place] Patient Name: Samantha Lutz Procedure Date: 08/09/2017 8:47 AM MRN: [id number] Endoscopist: [place]. Loletha Carrow , MD Age: 67 Referring MD:  Date of Birth: [birth year] Gender: Female Account #: [id number] Procedure:                Colonoscopy Indications:              Positive Cologuard test Medicines:                Monitored Anesthesia Care Procedure:                Pre-Anesthesia Assessment:                           - Prior to the procedure, a History and Physical                            was performed, and patient medications and                            allergies were reviewed. The patient's tolerance of                            previous anesthesia was also reviewed. The risks                            and benefits of the procedure and the sedation                            options and risks were discussed with the patient.                            All questions were answered, and informed consent                            was obtained. Prior Anticoagulants: The patient has                            taken no previous anticoagulant or antiplatelet                            agents. ASA Grade Assessment: II - A patient with                            mild systemic disease. After reviewing the risks                            and benefits, the patient was deemed in                            satisfactory condition to undergo the procedure.                           After obtaining informed consent, the colonoscope  was passed under direct vision. Throughout the                            procedure, the patient's blood pressure, pulse, and                            oxygen saturations were monitored continuously. The                            Colonoscope was introduced through the anus and                            advanced to the the cecum, identified by                            appendiceal orifice and ileocecal valve. The                             colonoscopy was performed without difficulty. The                            patient tolerated the procedure well. The quality                            of the bowel preparation was excellent. The                            ileocecal valve, appendiceal orifice, and rectum                            were photographed. Scope In: 8:59:21 AM Scope Out: 9:23:24 AM Scope Withdrawal Time: 0 hours 20 minutes 24 seconds  Total Procedure Duration: 0 hours 24 minutes 3 seconds  Findings:                 The digital rectal exam findings include palpable                            rectal mass.                           A 2 mm polyp was found in the cecum. The polyp was                            sessile. The polyp was removed with a cold biopsy                            forceps. Resection and retrieval were complete.                           A 8 mm polyp was found in the distal ascending                            colon. The polyp was sessile. The polyp was removed  with a cold snare. Resection and retrieval were                            complete.                           A fungating and ulcerated partially obstructing                            mass was found in the mid rectum. The mass was                            partially circumferential (involving two-thirds of                            the lumen circumference). The mass measured about                            six to seven cm in length (from 7cm to 14cm from                            anal verge). No bleeding was present. Mucosa was                            biopsied with a cold forceps for histology. One                            specimen bottle was sent to pathology. Area was                            tattooed with an injection of 2 mL of Spot (carbon                            black). Specifically, two tattoos of 0.5 ml each                            were made on opposite  walls about 3-5 cm proximal                            to the mass. Two more 0.5 ml tattoos were made on                            opposite walls right at the distal aspect of the                            mass.                           Retroflexion in the rectum was not performed due to                            the mass.  The exam was otherwise without abnormality. Complications:            No immediate complications. Estimated Blood Loss:     Estimated blood loss was minimal. Impression:               - Palpable rectal mass found on digital rectal exam.                           - One 2 mm polyp in the cecum, removed with a cold                            biopsy forceps. Resected and retrieved.                           - One 8 mm polyp in the distal ascending colon,                            removed with a cold snare. Resected and retrieved.                           - Likely malignant partially obstructing tumor in                            the mid rectum. Biopsied. Tattooed.                           - The examination was otherwise normal. Recommendation:           - Patient has a contact number available for                            emergencies. The signs and symptoms of potential                            delayed complications were discussed with the                            patient. Return to normal activities tomorrow.                            Written discharge instructions were provided to the                            patient.                           - Resume previous diet.                           - Continue present medications.                           - Miralax 1 capful (17 grams) in 8 ounces of water  PO daily. Decrease to one-half capful daily if                            stool becomes too loose or frequent.                           - Await pathology results. Oncology referral                             afterwards if malignancy confirmed.                           - Repeat colonoscopy is recommended for                            surveillance. The colonoscopy date will be                            determined after pathology results from today's                            exam become available for review. [name] Creegan L. Loletha Carrow, MD 08/09/2017 9:33:07 AM This report has been signed electronically.

## 2017-08-10 ENCOUNTER — Telehealth: Payer: Self-pay | Admitting: *Deleted

## 2017-08-10 ENCOUNTER — Telehealth: Payer: Self-pay

## 2017-08-10 NOTE — Telephone Encounter (Signed)
  Follow up Call-  Call back number 08/09/2017  Post procedure Call Back phone  # [phone] hm  Permission to leave phone message Yes  Some recent data might be hidden     Left message

## 2017-08-10 NOTE — Telephone Encounter (Signed)
  Follow up Call-  Call back number 08/09/2017  Post procedure Call Back phone  # [phone] hm  Permission to leave phone message Yes  Some recent data might be hidden     Patient questions:  Messge left to call us if necessary.

## 2017-08-13 ENCOUNTER — Telehealth: Payer: Self-pay | Admitting: Gastroenterology

## 2017-08-13 DIAGNOSIS — C20 Malignant neoplasm of rectum: Secondary | ICD-10-CM

## 2017-08-13 DIAGNOSIS — K6289 Other specified diseases of anus and rectum: Secondary | ICD-10-CM

## 2017-08-13 NOTE — Telephone Encounter (Signed)
We discussed the final path.  Samantha Lutz, Can you help with CT scan chest, abdomen, pelvis (staging for newly diagnosed rectal adenocarcinoma). Also labs:CEA level). She needs referral to [facility]. Depending on staging results, she may need a referral to radiation and medical oncology.  If CT shows no overt metastatic disease then she needs pelvic MRI for local [place] staging.  She also needs recall colonoscopy with Dr. Loletha Carrow in 1 year  Samantha Lutz.  thanks  [place],  No results letter is needed.  Thanks

## 2017-08-13 NOTE — Telephone Encounter (Signed)
I left a VM on her mobile for her to call to discuss path report from colonoscopy last week.

## 2017-08-13 NOTE — Telephone Encounter (Signed)
The pt has been advised of the information and verbalized understanding.   Contrast and instructions at the front desk

## 2017-08-13 NOTE — Telephone Encounter (Signed)
You have been scheduled for a CT scan of the abdomen and pelvis at [facility] ([address]---this is in the same building as Press [facility]).   You are scheduled on 08/17/17 at 4 pm . You should arrive 15 minutes prior to your appointment time for registration. Please follow the written instructions below on the day of your exam:  WARNING: IF YOU ARE ALLERGIC TO IODINE/X-RAY DYE, PLEASE NOTIFY RADIOLOGY IMMEDIATELY AT [phone]! YOU WILL BE GIVEN A 13 HOUR PREMEDICATION PREP.  1) Do not eat or drink anything after 12 noon (4 hours prior to your test) 2) You have been given 2 bottles of oral contrast to drink. The solution may taste better if refrigerated, but do NOT add ice or any other liquid to this solution. Shake well before drinking.   Drink 1 bottle of contrast @ 2 pm (2 hours prior to your exam) Drink 1 bottle of contrast @ 3 pm (1 hour prior to your exam)  You may take any medications as prescribed with a small amount of water except for the following: Metformin, Glucophage, Glucovance, Avandamet, Riomet, Fortamet, Actoplus Met, Janumet, Glumetza or Metaglip. The above medications must be held the day of the exam AND 48 hours after the exam.  The purpose of you drinking the oral contrast is to aid in the visualization of your intestinal tract. The contrast solution may cause some diarrhea. Before your exam is started, you will be given a small amount of fluid to drink. Depending on your individual set of symptoms, you may also receive an intravenous injection of x-ray contrast/dye. Plan on being at [facility] for 30 minutes or longer, depending on the type of exam you are having performed.  This test typically takes 30-45 minutes to complete.  If you have any questions regarding your exam or if you need to reschedule, you may call the CT department at [phone] between the hours of 8:00 am and 5:00 pm,  Monday-Friday.  ________________________________________________________________________  The pt has been advised of the information and verbalized understanding.

## 2017-08-15 ENCOUNTER — Other Ambulatory Visit: Payer: Medicare Other

## 2017-08-15 DIAGNOSIS — K6289 Other specified diseases of anus and rectum: Secondary | ICD-10-CM

## 2017-08-15 DIAGNOSIS — C20 Malignant neoplasm of rectum: Secondary | ICD-10-CM

## 2017-08-15 NOTE — Telephone Encounter (Signed)
Thank you both for attending to this while I am out of the office this week. - HD

## 2017-08-16 ENCOUNTER — Other Ambulatory Visit (INDEPENDENT_AMBULATORY_CARE_PROVIDER_SITE_OTHER): Payer: Medicare Other

## 2017-08-16 ENCOUNTER — Other Ambulatory Visit: Payer: Self-pay

## 2017-08-16 DIAGNOSIS — C20 Malignant neoplasm of rectum: Secondary | ICD-10-CM | POA: Diagnosis not present

## 2017-08-16 DIAGNOSIS — K6289 Other specified diseases of anus and rectum: Secondary | ICD-10-CM

## 2017-08-16 DIAGNOSIS — K629 Disease of anus and rectum, unspecified: Secondary | ICD-10-CM

## 2017-08-16 LAB — CREATININE, SERUM: Creatinine, Ser: 0.97 mg/dL (ref 0.40–1.20)

## 2017-08-16 LAB — BUN: BUN: 17 mg/dL (ref 6–23)

## 2017-08-17 ENCOUNTER — Ambulatory Visit (INDEPENDENT_AMBULATORY_CARE_PROVIDER_SITE_OTHER)
Admission: RE | Admit: 2017-08-17 | Discharge: 2017-08-17 | Disposition: A | Discharge status: Home / Self Care | Payer: Medicare Other | Source: Ambulatory Visit | Attending: Gastroenterology | Admitting: Gastroenterology

## 2017-08-17 DIAGNOSIS — C20 Malignant neoplasm of rectum: Secondary | ICD-10-CM

## 2017-08-17 LAB — CEA: CEA: <0.5 ng/mL

## 2017-08-17 MED ORDER — IOPAMIDOL (ISOVUE-300) INJECTION 61%
100.0000 mL | Freq: Once | INTRAVENOUS | Status: AC | PRN
  Administered 2017-08-17: 100 mL via INTRAVENOUS

## 2017-08-20 ENCOUNTER — Other Ambulatory Visit: Payer: Self-pay

## 2017-08-20 DIAGNOSIS — C20 Malignant neoplasm of rectum: Secondary | ICD-10-CM

## 2017-08-21 ENCOUNTER — Ambulatory Visit: Payer: Self-pay | Admitting: Surgery

## 2017-08-21 ENCOUNTER — Other Ambulatory Visit: Payer: Self-pay | Admitting: Surgery

## 2017-08-21 DIAGNOSIS — C20 Malignant neoplasm of rectum: Secondary | ICD-10-CM

## 2017-08-22 ENCOUNTER — Encounter: Payer: Self-pay | Admitting: Oncology

## 2017-08-22 ENCOUNTER — Telehealth: Payer: Self-pay | Admitting: Oncology

## 2017-08-22 NOTE — Telephone Encounter (Signed)
Attempted to call the pt regarding referral from Dr. Johney Maine. Pt's vm is full. She has been scheduled to see Dr. Benay Spice on 7/31 at 1130am. I will mail the pt a letter with the appointment date and time.

## 2017-08-22 NOTE — Progress Notes (Signed)
Called patient to introduce myself and explain the role of GI Navigator. I gave T/D/L of initial consult with Dr. Benay Spice on 08/29/17 @ 11;30 AM. Patient aware to arrive 30 minutes early. I was able to explain what the initial consult would be like and I provided my contact information for further questions.

## 2017-08-23 ENCOUNTER — Encounter: Payer: Self-pay | Admitting: Radiation Oncology

## 2017-08-26 ENCOUNTER — Other Ambulatory Visit: Payer: Medicare Other

## 2017-08-27 ENCOUNTER — Ambulatory Visit (HOSPITAL_COMMUNITY): Payer: Medicare Other

## 2017-08-27 NOTE — Progress Notes (Signed)
Pt's initial consult rescheduled for 09/03/17 @ 2 PM. Patient husband  aware to arrive at 1:30 PM.

## 2017-08-27 NOTE — Progress Notes (Signed)
Patient's husband called to advise that patient's MRI was rescheduled to 09/01/17.

## 2017-08-29 ENCOUNTER — Inpatient Hospital Stay: Payer: Medicare Other | Admitting: Oncology

## 2017-09-01 ENCOUNTER — Ambulatory Visit
Admission: RE | Admit: 2017-09-01 | Discharge: 2017-09-01 | Disposition: A | Discharge status: Home / Self Care | Payer: Medicare Other | Source: Ambulatory Visit | Attending: Surgery | Admitting: Surgery

## 2017-09-01 DIAGNOSIS — C20 Malignant neoplasm of rectum: Secondary | ICD-10-CM

## 2017-09-01 MED ORDER — GADOBENATE DIMEGLUMINE 529 MG/ML IV SOLN
20.0000 mL | Freq: Once | INTRAVENOUS | Status: AC | PRN
  Administered 2017-09-01: 20 mL via INTRAVENOUS

## 2017-09-03 ENCOUNTER — Inpatient Hospital Stay: Payer: Medicare Other | Attending: Oncology | Admitting: Oncology

## 2017-09-03 ENCOUNTER — Telehealth: Payer: Self-pay | Admitting: Oncology

## 2017-09-03 ENCOUNTER — Encounter: Payer: Self-pay | Admitting: *Deleted

## 2017-09-03 ENCOUNTER — Encounter: Payer: Self-pay | Admitting: Oncology

## 2017-09-03 VITALS — BP 170/80 | HR 101 | Temp 98.4°F | Resp 18 | Ht 63.0 in | Wt 243.3 lb

## 2017-09-03 DIAGNOSIS — E039 Hypothyroidism, unspecified: Secondary | ICD-10-CM | POA: Diagnosis not present

## 2017-09-03 DIAGNOSIS — R110 Nausea: Secondary | ICD-10-CM | POA: Diagnosis not present

## 2017-09-03 DIAGNOSIS — M1990 Unspecified osteoarthritis, unspecified site: Secondary | ICD-10-CM | POA: Insufficient documentation

## 2017-09-03 DIAGNOSIS — Z79899 Other long term (current) drug therapy: Secondary | ICD-10-CM | POA: Diagnosis not present

## 2017-09-03 DIAGNOSIS — C20 Malignant neoplasm of rectum: Secondary | ICD-10-CM | POA: Insufficient documentation

## 2017-09-03 DIAGNOSIS — K760 Fatty (change of) liver, not elsewhere classified: Secondary | ICD-10-CM | POA: Insufficient documentation

## 2017-09-03 DIAGNOSIS — I10 Essential (primary) hypertension: Secondary | ICD-10-CM

## 2017-09-03 DIAGNOSIS — Z006 Encounter for examination for normal comparison and control in clinical research program: Secondary | ICD-10-CM

## 2017-09-03 NOTE — Progress Notes (Signed)
GI Location of Tumor / Histology: Rectal Adenocarcinoma  Samantha Lutz presented with blood in her stool, starting in February.  Colonoscopy 08/09/2017: Digital exam revealed a palpable rectal mass.  A bulky mass in the mid rectum about 6-7 cm in length, from 7-14 cm from the anal verge.  CT CAP 08/17/2017:  Asymmetric thickening in the mid rectum may represent lesion of concern.  No local nodal metastasis in the pelvis.  No evidence of distant metastatic disease in the abdomen.  No evidence of thoracic metastasis.     MRI Pelvis 09/01/2017: Rectal adenocarcinoma T stage: T3b, rectal adenocarcinoma N stage: N0, distance from the tumor to the anal sphincter is 6.3 cm.  Biopsies of Rectum 08/09/2017   Past/Anticipated interventions by surgeon, if any:  Dr. Johney Maine 08/21/2017 -If seems consistent with a T3 or N1 lesion or worse, then proceed with neoadjuvant chemoradiation therapy.  Usually oral xeloda and radiation together over 5.5 week course. -Then low anterior resection 10 weeks later.  Robotically assisted approach.  I suspect given her obesity and the rumor being palpable by my finger, most likely the anastomosis will be within 5 cm and require protective loop ileostomy temporarily for sphincter sparing surgery.  She is not a smoker and is female, but obese.  Usually colorectal EEA anastomosis ends up lower than you think in morbidly obese folks.  We will see. Chemo and radiation before surgery.  Past/Anticipated interventions by medical oncology, if any:  Dr. Benay Spice 09/03/2017   Weight changes, if any: Lost a couple pounds since July.  Bowel/Bladder complaints, if any: No  Nausea / Vomiting, if any: No  Pain issues, if any: Occasional cramps.  No pain with bowel movements.  Any blood per rectum: No, none noted since colonoscopy.  BP (!) 154/101 (BP Location: Left Arm, Patient Position: Sitting, Cuff Size: Large)   Pulse 89   Temp 98.4 F (36.9 C) (Oral)   Resp 20   Ht 5\' 3"  (1.6  m)   Wt 242 lb 3.2 oz (109.9 kg)   SpO2 99%   BMI 42.90 kg/m    Wt Readings from Last 3 Encounters:  09/04/17 242 lb 3.2 oz (109.9 kg)  09/03/17 243 lb 4.8 oz (110.4 kg)  08/09/17 248 lb (112.5 kg)    SAFETY ISSUES:  Prior radiation? No  Pacemaker/ICD? No  Possible current pregnancy? No  Is the patient on methotrexate? No  Current Complaints/Details:

## 2017-09-03 NOTE — Progress Notes (Signed)
[facility] Patient Consult   Requesting MD: Jillienne Egner 67 y.o.  Jul 26, 1950    Reason for consult: Rectal cancer   HPI: Samantha Lutz reports rectal bleeding beginning in February of this year.  She saw her primary provider and a "Cologuard "study returned positive.  She was referred to Dr. Loletha Carrow and was taken to colonoscopy 08/09/2017.  A mass was noted on digital exam.  A 2 mm polyp was removed from the cecum.  An 8 mm polyp was removed from the ascending colon.  A fungating mass was found in the mid rectum beginning at 7 cm from the anal verge.  The mass was biopsied and the area was tattooed.  The pathology revealed benign colonic mucosa at the cecum, a sessile serrated polyp at the ascending colon, and invasive adenocarcinoma involving the rectum biopsy. She was referred for CTs of the chest, abdomen, and pelvis on 08/17/2017.  No suspicious pulmonary nodules.  No focal hepatic lesion.  Asymmetric thickening is noted at the mid rectum at the anterior wall.  No lymph nodes in the perirectal fat.  No pelvic lymph nodes.  No retroperitoneal or periportal adenopathy.  She saw Dr. Johney Maine.  She was referred for a pelvic MRI on 09/01/2017.  This confirmed a tumor in the mid rectum measured at 6.3 cm from the anal sphincter.  Tumor extended through the muscularis propria.  No lymph nodes.  The tumor was staged as a T3b, N0 rectal cancer.  She is referred to consider neoadjuvant therapy.  Past Medical History:  Diagnosis Date  . Anemia   . Arthritis   . Fatty liver    ultrasound December 2012  . Hypertension    diet controlled  . Hypothyroid 01/16/2013 dx  . Lichen sclerosus    steroids as needed    .  G4 P2, AB 2  Past Surgical History:  Procedure Laterality Date  . APPENDECTOMY  1967  . CESAREAN SECTION  1976 & 1983    Medications: Reviewed  Allergies:  Allergies  Allergen Reactions  . Epinephrine Palpitations  . Losartan Hives     Family history: A paternal aunt had lung cancer and was a smoker, a maternal aunt had "cancer ".  No other family history of cancer.  Social History:   She lives with her Lutz and [place].  She is retired.  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  She does not use cigarettes or alcohol.  No transfusion history.  No risk factor for HIV or hepatitis.  ROS:   Positives include: Rectal bleeding since February 2019-stopped after undergoing a colonoscopy, bilateral knee "arthritis ", "knot "in the right thigh  A complete ROS was otherwise negative.  Physical Exam:  Blood pressure (!) 179/114, pulse (!) 101, temperature 98.4 F (36.9 C), temperature source Oral, resp. rate 18, height 5\' 3"  (1.6 m), weight 243 lb 4.8 oz (110.4 kg), SpO2 98 %.  HEENT: Oropharynx without visible mass, neck without mass Lungs: Clear bilaterally Cardiac: Regular rate and rhythm Abdomen: No hepatosplenomegaly, nontender, no mass  Vascular: No leg edema Lymph nodes: No cervical, supraclavicular, axillary, or inguinal nodes Neurologic: Alert and oriented, the motor exam appears intact in the upper and lower extremities Skin: No rash Musculoskeletal: No spine tenderness   LAB:   CEA on 08/15/2017: <0.5  Imaging: As per HPI, CT 08/17/2017 and MRI 09/01/2017-images reviewed with Samantha Lutz  Assessment/Plan:   1. Rectal cancer  Mass  at 7 cm from the anal verge on colonoscopy 08/09/2017, biopsy revealed invasive adenocarcinoma  Staging CTs 08/17/2017-no evidence of metastatic disease, asymmetric thickening in the mid rectum  MR pelvis 09/01/2017, T3N0 lesion beginning at 6.3 cm from the anal sphincter  2.   Hypothyroid   Disposition:   Samantha Lutz has been diagnosed with rectal cancer.  She has a clinical stage II tumor based on the staging evaluation to date.  I discussed treatment of rectal cancer with Samantha Lutz and her Lutz.  She appears to be a candidate for  neoadjuvant chemotherapy/radiation to be followed by definitive surgery.  I recommend neoadjuvant capecitabine and radiation.  We reviewed potential toxicities associated with capecitabine including the chance for nausea, mucositis, alopecia, diarrhea, and hematologic toxicity.  We discussed the sun sensitivity, rash, hyperpigmentation, and hand/foot syndrome associated with capecitabine.  She agrees to proceed.  She will attend a chemotherapy teaching class.  Samantha Lutz is scheduled to see Dr. Lisbeth Renshaw on 09/04/2017.  The plan is to initiate combined chemotherapy and radiation on 09/17/2017.  We will check a baseline CBC and chemistry panel when she is here for the chemotherapy teaching class VIII 06/2017.  Her case will be presented at the GI tumor conference within the next few weeks.  Betsy Coder, MD  09/03/2017, 2:00 PM

## 2017-09-03 NOTE — Progress Notes (Signed)
Met with Samantha Lutz  and family. Explained role of nurse navigator. Educational information provided on colorectal cancer and constipation strategies. Explained available pelvic floor rehab/PT referral. Patient will let me know if she would like the referral. Contact names and phone numbers were provided for entire [facility] team.  Teach back method was used.  No barriers to care identified at present time.  Will continue to follow as needed

## 2017-09-03 NOTE — Telephone Encounter (Signed)
Scheduled appt per 8/5 los - gave patient AVS and calender per los.  

## 2017-09-04 ENCOUNTER — Inpatient Hospital Stay: Payer: Medicare Other

## 2017-09-04 ENCOUNTER — Other Ambulatory Visit: Payer: Self-pay | Admitting: *Deleted

## 2017-09-04 ENCOUNTER — Encounter: Payer: Self-pay | Admitting: Radiation Oncology

## 2017-09-04 ENCOUNTER — Other Ambulatory Visit: Payer: Self-pay

## 2017-09-04 ENCOUNTER — Ambulatory Visit
Admission: RE | Admit: 2017-09-04 | Discharge: 2017-09-04 | Disposition: A | Discharge status: Home / Self Care | Payer: Medicare Other | Source: Ambulatory Visit | Attending: Radiation Oncology | Admitting: Radiation Oncology

## 2017-09-04 ENCOUNTER — Telehealth: Payer: Self-pay | Admitting: Pharmacist

## 2017-09-04 VITALS — BP 154/101 | HR 89 | Temp 98.4°F | Resp 20 | Ht 63.0 in | Wt 242.2 lb

## 2017-09-04 DIAGNOSIS — C20 Malignant neoplasm of rectum: Secondary | ICD-10-CM

## 2017-09-04 DIAGNOSIS — K6389 Other specified diseases of intestine: Secondary | ICD-10-CM | POA: Insufficient documentation

## 2017-09-04 DIAGNOSIS — Z006 Encounter for examination for normal comparison and control in clinical research program: Secondary | ICD-10-CM

## 2017-09-04 LAB — CMP (CANCER CENTER ONLY)
ALT: 30 U/L (ref 0–44)
AST: 29 U/L (ref 15–41)
Albumin: 3.7 g/dL (ref 3.5–5.0)
Alkaline Phosphatase: 67 U/L (ref 38–126)
Anion gap: 12 (ref 5–15)
BUN: 15 mg/dL (ref 8–23)
CO2: 20 mmol/L — ABNORMAL LOW (ref 22–32)
Calcium: 8.9 mg/dL (ref 8.9–10.3)
Chloride: 109 mmol/L (ref 98–111)
Creatinine: 0.87 mg/dL (ref 0.44–1.00)
GFR, Est AFR Am: >60 mL/min (ref >60)
GFR, Estimated: >60 mL/min (ref >60)
Glucose, Bld: 117 mg/dL — ABNORMAL HIGH (ref 70–99)
Potassium: 3.9 mmol/L (ref 3.5–5.1)
Sodium: 141 mmol/L (ref 135–145)
Total Bilirubin: 0.6 mg/dL (ref 0.3–1.2)
Total Protein: 7.3 g/dL (ref 6.5–8.1)

## 2017-09-04 LAB — CBC WITH DIFFERENTIAL (CANCER CENTER ONLY)
Basophils Absolute: 0 10*3/uL (ref 0.0–0.1)
Basophils Relative: 1 %
Eosinophils Absolute: 0.1 10*3/uL (ref 0.0–0.5)
Eosinophils Relative: 2 %
HCT: 38.9 % (ref 34.8–46.6)
Hemoglobin: 13.2 g/dL (ref 11.6–15.9)
Lymphocytes Relative: 22 %
Lymphs Abs: 1.6 10*3/uL (ref 0.9–3.3)
MCH: 29.8 pg (ref 25.1–34.0)
MCHC: 34 g/dL (ref 31.5–36.0)
MCV: 87.6 fL (ref 79.5–101.0)
Monocytes Absolute: 0.7 10*3/uL (ref 0.1–0.9)
Monocytes Relative: 10 %
Neutro Abs: 4.5 10*3/uL (ref 1.5–6.5)
Neutrophils Relative %: 65 %
Platelet Count: 191 10*3/uL (ref 145–400)
RBC: 4.44 MIL/uL (ref 3.70–5.45)
RDW: 13.9 % (ref 11.2–14.5)
WBC Count: 7 10*3/uL (ref 3.9–10.3)

## 2017-09-04 LAB — RESEARCH LABS

## 2017-09-04 MED ORDER — CAPECITABINE 500 MG PO TABS: ORAL_TABLET | ORAL | 0 refills | Status: DC

## 2017-09-04 MED ORDER — PROCHLORPERAZINE MALEATE 10 MG PO TABS: 10.0000 mg | ORAL_TABLET | Freq: Four times a day (QID) | ORAL | 0 refills | Status: DC | PRN

## 2017-09-04 NOTE — Telephone Encounter (Signed)
Oral Oncology Pharmacist Encounter  Received new prescription for Xeloda (capeciatbine) for the neoadjuvant treatment of stage II recatl cancer in conjunction with radiation, planned duration 5-6 weeks.  Labs to be completed today. Xeloda to be dosed at ~780 mg/m2 BID on radiation day only  Current medication list in Epic reviewed, no DDIs with Xeloda identified.  Prescription has been e-scribed to the [facility] for benefits analysis and approval.  Oral Oncology Clinic will continue to follow for insurance authorization, copayment issues, initial counseling and start date.  Johny Drilling, PharmD, BCPS, BCOP  09/04/2017 1:34 PM Oral Oncology Clinic [phone]

## 2017-09-04 NOTE — Progress Notes (Signed)
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 A diagnosis of Malignant neoplasm of rectum [facility]) was also pertinent to this visit.   HISTORY OF PRESENT ILLNESS: Samantha Lutz is a 67 y.o. female seen at the request of Dr. Benay Spice for a new diagnosis of rectal cancer. The patient noticed several months of intermittent rectal bleeding and proceeded with cologuard at home. This was positive and she proceeded to meet with Dr. Loletha Carrow. Colonoscopy on 08/09/17 revealed a mass on exam 7 cm from the anal verge. She had several noncancerous polyps as well but the rectal mass that was biopsied revealed an adenocarcinoma. She met with Dr. Johney Maine and CT imaging was ordered as well as an MRI of the pelvis. A CEA was < 0.5 on 08/15/17. Her CT C/A/P on 08/17/17 revealed thickening of the mid rectum. No abnormal nodes or concerns for metastatic disease was present. An MRI of the pelvis on 09/01/17 noted the tumor 6.3 cm to the anal sphincter, and her tumor invaded the muscularis at 4 mm, and no adenopathy classifying her cancer as a cT3bN0. She met with Dr. Benay Spice and has plans to begin Xeloda.  She comes today to discuss the role of chemoRT.     PREVIOUS RADIATION THERAPY: No   PAST MEDICAL HISTORY:  Past Medical History:  Diagnosis Date  . Anemia   . Arthritis   . Fatty liver    ultrasound December 2012  . Hypertension    diet controlled  . Hypothyroid 01/16/2013 dx  . Lichen sclerosus    steroids as needed       PAST SURGICAL HISTORY: Past Surgical History:  Procedure Laterality Date  . APPENDECTOMY  1967  . CESAREAN SECTION  1976 & 1983     FAMILY HISTORY:  Family History  Problem Relation Age of  Onset  . Diabetes Mother   . Hypertension Mother   . COPD Mother   . Glaucoma Father 25  . Stroke Maternal Grandmother 25  . Hypertension Maternal Grandmother   . Rheum arthritis Sister 24  . Hypertension Daughter   . Breast cancer Cousin   . Colon cancer Neg Hx   . Esophageal cancer Neg Hx   . Liver cancer Neg Hx   . Pancreatic cancer Neg Hx   . Stomach cancer Neg Hx   . Rectal cancer Neg Hx      SOCIAL HISTORY:  reports that she has never smoked. She has never used smokeless tobacco. She reports that she does not drink alcohol or use drugs. The patient is retired and keeps a grandchild during the days after school. She is accompanied by her husband and lives in [place].   ALLERGIES: Epinephrine and Losartan   MEDICATIONS:  Current Outpatient Medications  Medication Sig Dispense Refill  . capecitabine (XELODA) 500 MG tablet Take 4 tablets (2067m) by mouth in AM & 3 tabs (1502m in PM, within 3030mof finishing food. Take on radiation days only, M-F 196 tablet 0  . levothyroxine (SYNTHROID, LEVOTHROID) 50 MCG tablet Take 1 tablet (50 mcg total) by mouth daily. 90 tablet 0  . Multiple Vitamin (MULTIVITAMIN) capsule Take 1 capsule by mouth daily.    .Marland Kitchen  prochlorperazine (COMPAZINE) 10 MG tablet Take 1 tablet (10 mg total) by mouth every 6 (six) hours as needed for nausea or vomiting. 30 tablet 0  . metroNIDAZOLE (FLAGYL) 500 MG tablet TAKE 2 TABLETS BY MOUTH AT 2 00 PM 3 00 PM AND 10 00 PM THE DAY PRIOR TO SURGERY  0  . neomycin (MYCIFRADIN) 500 MG tablet TAKE 2 TABLETS BY MOUTH AT 2 00 PM 3 00 PM AND 10 00 PM THE DAY PRIOR TO SURGERY  0   Current Facility-Administered Medications  Medication Dose Route Frequency Provider Last Rate Last Dose  . 0.9 %  sodium chloride infusion  500 mL Intravenous Continuous Danis, Estill Cotta III, MD         REVIEW OF SYSTEMS: On review of systems, the patient reports that she is doing well overall. She denies any chest pain, shortness of breath,  cough, fevers, chills, night sweats, unintended weight changes. She is no longer seeing rectal bleeding and denies any bowel or bladder disturbances, and denies abdominal pain, nausea or vomiting. She denies any new musculoskeletal or joint aches or pains. A complete review of systems is obtained and is otherwise negative.     PHYSICAL EXAM:  Wt Readings from Last 3 Encounters:  09/04/17 242 lb 3.2 oz (109.9 kg)  09/03/17 243 lb 4.8 oz (110.4 kg)  08/09/17 248 lb (112.5 kg)   Temp Readings from Last 3 Encounters:  09/04/17 98.4 F (36.9 C) (Oral)  09/03/17 98.4 F (36.9 C) (Oral)  08/09/17 98.6 F (37 C)   BP Readings from Last 3 Encounters:  09/04/17 (!) 154/101  09/03/17 (!) 170/80  08/09/17 (!) 155/75   Pulse Readings from Last 3 Encounters:  09/04/17 89  09/03/17 (!) 101  08/09/17 79   Pain Assessment Pain Score: 0-No pain/10  In general this is a well appearing caucasian female in no acute distress. She is alert and oriented x4 and appropriate throughout the examination. HEENT reveals that the patient is normocephalic, atraumatic. EOMs are intact. Skin is intact without any evidence of gross lesions. Cardiovascular exam reveals a regular rate and rhythm, no clicks rubs or murmurs are auscultated. Chest is clear to auscultation bilaterally. Lymphatic assessment is performed and does not reveal any adenopathy in the cervical, supraclavicular, axillary, or inguinal chains. Abdomen has active bowel sounds in all quadrants and is intact. The abdomen is soft, non tender, non distended. Lower extremities are negative for pretibial pitting edema, deep calf tenderness, cyanosis or clubbing.   ECOG = 0  0 - Asymptomatic (Fully active, able to carry on all predisease activities without restriction)  1 - Symptomatic but completely ambulatory (Restricted in physically strenuous activity but ambulatory and able to carry out work of a light or sedentary nature. For example, light  housework, office work)  2 - Symptomatic, <50% in bed during the day (Ambulatory and capable of all self care but unable to carry out any work activities. Up and about more than 50% of waking hours)  3 - Symptomatic, >50% in bed, but not bedbound (Capable of only limited self-care, confined to bed or chair 50% or more of waking hours)  4 - Bedbound (Completely disabled. Cannot carry on any self-care. Totally confined to bed or chair)  5 - Death   Eustace Pen MM, Creech RH, Tormey DC, et al. [phone]). "Toxicity and response criteria of the [facility] Group". [place] Oncol. 5 (6): 649-55    LABORATORY DATA:  Lab Results  Component  Value Date   WBC 7.0 09/04/2017   HGB 13.2 09/04/2017   HCT 38.9 09/04/2017   MCV 87.6 09/04/2017   PLT 191 09/04/2017   Lab Results  Component Value Date   NA 141 09/04/2017   K 3.9 09/04/2017   CL 109 09/04/2017   CO2 20 (L) 09/04/2017   Lab Results  Component Value Date   ALT 30 09/04/2017   AST 29 09/04/2017   ALKPHOS 67 09/04/2017   BILITOT 0.6 09/04/2017      RADIOGRAPHY: Ct Chest W Contrast  Result Date: 08/17/2017 CLINICAL DATA:  Lue diagnosed rectal carcinoma.  Blood in stool. EXAM: CT CHEST, ABDOMEN, AND PELVIS WITH CONTRAST TECHNIQUE: Multidetector CT imaging of the chest, abdomen and pelvis was performed following the standard protocol during bolus administration of intravenous contrast. CONTRAST:  163m ISOVUE-300 IOPAMIDOL (ISOVUE-300) INJECTION 61% COMPARISON:  None FINDINGS: CT CHEST FINDINGS Cardiovascular: No significant vascular findings. Normal heart size. No pericardial effusion. Mediastinum/Nodes: No axillary supraclavicular adenopathy. No mediastinal hilar adenopathy. No pericardial effusion. Esophagus normal. Lungs/Pleura: No suspicious pulmonary nodules.  Airways normal. Musculoskeletal: No aggressive osseous lesion. CT ABDOMEN AND PELVIS FINDINGS Hepatobiliary: No focal hepatic lesion. No biliary ductal  dilatation. Gallbladder is normal. Common bile duct is normal. Pancreas: Pancreas is normal. No ductal dilatation. No pancreatic inflammation. Spleen: Normal spleen Adrenals/urinary tract: Adrenal glands and kidneys are normal. The ureters and bladder normal. Stomach/Bowel: Small hiatal hernia. Stomach, small-bowel cecum normal. Appendix not identified. Ascending, transverse, and descending colon normal. Sigmoid colon normal. There is asymmetric thickening of the mid rectum along the anterior wall (image 89/7/sagittal series). This may represent the rectal mass. No lymph nodes in the perirectal fat. No pelvic lymph nodes. No mesenteric lymph nodes. No bowel obstruction. Vascular/Lymphatic: Abdominal aorta is normal caliber. There is no retroperitoneal or periportal lymphadenopathy. No pelvic lymphadenopathy. Reproductive: Uterus and ovaries normal. Other: No free fluid. Musculoskeletal: No aggressive osseous lesion. IMPRESSION: 1. Asymmetric thickening in the mid rectum may represent lesion of concern. 2. No local nodal metastasis in the pelvis. 3. No evidence of distant metastatic disease in the abdomen. 4. No evidence of thoracic metastasis. Electronically Signed   By: SSuzy BouchardM.D.   On: 08/17/2017 17:25   Mr Pelvis W Wo Contrast  Result Date: 09/01/2017 CLINICAL DATA:  Newly diagnosed rectal carcinoma. EXAM: MRI PELVIS WITHOUT AND WITH CONTRAST TECHNIQUE: Multiplanar multisequence MR imaging of the pelvis was performed both before and after administration of intravenous contrast. Small amount of UKoreagel was administered per rectum to optimize tumor evaluation. CONTRAST:  229mMULTIHANCE GADOBENATE DIMEGLUMINE 529 MG/ML IV SOLN COMPARISON:  None. FINDINGS: TUMOR LOCATION Location from Anal Verge: Middle third Shortest Distance from Tumor to Anal Sphincter: 6.3 cm TUMOR DESCRIPTION Circumferential Extent: 100% Tumor Length: 3.7 cm T - CATEGORY Extension through Muscularis Propria: Yes, measuring  approximately 4 mm = T3b Shortest Distance of any tumor/node from Mesorectal Fascia: 12 mm Extramural Vascular Invasion/Tumor Thrombus:  No Invasion of Anterior Peritoneal Reflection:  No Involvement of Adjacent Organs or Pelvic Sidewall: No Levator Ani Involvement:  No N - CATEGORY Mesorectal Lymph Nodes >=43m46mNone=N0 Extra-mesorectal Lymphadenopathy:  No/Yes = N2 Other:  None. IMPRESSION: Rectal adenocarcinoma T stage:  T3b Rectal adenocarcinoma N stage:  N0 Distance from tumor to the anal sphincter is 6.3 cm. Electronically Signed   By: JohEarle GellD.   On: 09/01/2017 14:15   Ct Abdomen Pelvis W Contrast  Result Date: 08/17/2017 CLINICAL DATA:  Lue diagnosed rectal carcinoma.  Blood in stool. EXAM: CT CHEST, ABDOMEN, AND PELVIS WITH CONTRAST TECHNIQUE: Multidetector CT imaging of the chest, abdomen and pelvis was performed following the standard protocol during bolus administration of intravenous contrast. CONTRAST:  122m ISOVUE-300 IOPAMIDOL (ISOVUE-300) INJECTION 61% COMPARISON:  None FINDINGS: CT CHEST FINDINGS Cardiovascular: No significant vascular findings. Normal heart size. No pericardial effusion. Mediastinum/Nodes: No axillary supraclavicular adenopathy. No mediastinal hilar adenopathy. No pericardial effusion. Esophagus normal. Lungs/Pleura: No suspicious pulmonary nodules.  Airways normal. Musculoskeletal: No aggressive osseous lesion. CT ABDOMEN AND PELVIS FINDINGS Hepatobiliary: No focal hepatic lesion. No biliary ductal dilatation. Gallbladder is normal. Common bile duct is normal. Pancreas: Pancreas is normal. No ductal dilatation. No pancreatic inflammation. Spleen: Normal spleen Adrenals/urinary tract: Adrenal glands and kidneys are normal. The ureters and bladder normal. Stomach/Bowel: Small hiatal hernia. Stomach, small-bowel cecum normal. Appendix not identified. Ascending, transverse, and descending colon normal. Sigmoid colon normal. There is asymmetric thickening of the mid rectum  along the anterior wall (image 89/7/sagittal series). This may represent the rectal mass. No lymph nodes in the perirectal fat. No pelvic lymph nodes. No mesenteric lymph nodes. No bowel obstruction. Vascular/Lymphatic: Abdominal aorta is normal caliber. There is no retroperitoneal or periportal lymphadenopathy. No pelvic lymphadenopathy. Reproductive: Uterus and ovaries normal. Other: No free fluid. Musculoskeletal: No aggressive osseous lesion. IMPRESSION: 1. Asymmetric thickening in the mid rectum may represent lesion of concern. 2. No local nodal metastasis in the pelvis. 3. No evidence of distant metastatic disease in the abdomen. 4. No evidence of thoracic metastasis. Electronically Signed   By: SSuzy BouchardM.D.   On: 08/17/2017 17:25       IMPRESSION/PLAN: 1. Stage IIA, cT3N0M0 adenocarcinoma of the rectum. Dr. MLisbeth Renshawdiscusses the pathology findings and reviews the nature of rectal carcinomas and the rationale for concurrent chemoRT. Dr. MLisbeth Renshawoutlines the findings thus far and recommends moving forward with chemoRT followed by surgical resection. We discussed the risks, benefits, short, and long term effects of radiotherapy, and the patient is interested in proceeding. Dr. MLisbeth Renshawdiscusses the delivery and logistics of radiotherapy and anticipates a course of 5 1/2 weeks of radiotherapy. Written consent is obtained and placed in the chart, a copy was provided to the patient. She will be contacted by our staff to coordinate simulation.   The above documentation reflects my direct findings during this shared patient visit. Please see the separate note by Dr. MLisbeth Renshawon this date for the remainder of the patient's plan of care.    ACarola Rhine PAC

## 2017-09-04 NOTE — Telephone Encounter (Signed)
Oral Chemotherapy Pharmacist Encounter   I spoke with patient and husband in chemotherapy education class for overview of: Xeloda.   Counseled patient on administration, dosing, side effects, monitoring, drug-food interactions, safe handling, storage, and disposal.  Patient will take Xeloda 500mg  tablets, 4 tablets (2000mg ) by mouth in AM and 3 tabs (1500mg ) by mouth in PM, within 30 minutes of finishing meals, on days of radiation only.  Xeloda and radiation start date: 09/17/2017  Adverse effects of Xeloda include but are not limited to: fatigue, decreased blood counts, GI upset, diarrhea, and hand-foot syndrome.  Patient has anti-emetic on hand and knows to take it if nausea develops.   Patient will obtain anti diarrheal and alert the office of 4 or more loose stools above baseline.   Reviewed with patient importance of keeping a medication schedule and plan for any missed doses.  Samantha Lutz voiced understanding and appreciation.   All questions answered.  Insurance authorization will be submitted if required. I will follow-up with the patient about insurance authorization, copayment, and medication acquisition later this week.  Medication reconciliation performed and medication/allergy list updated.  Patient knows to call the office with questions or concerns. Oral Oncology Clinic will continue to follow.  Thank you,  Johny Drilling, PharmD, BCPS, BCOP  09/04/2017  3:24 PM Oral Oncology Clinic [phone]

## 2017-09-05 MED FILL — CAPECITABINE 500 MG TABS: 500 | 38 days supply | Qty: 196 | Fill #0

## 2017-09-05 NOTE — Telephone Encounter (Signed)
Oral Chemotherapy Pharmacist Encounter   Attempted to reach patient to provide update on oral medication: Xeloda.  No answer. Left VM for patient to call back.   Insurance authorization is not required. Test claim at the pharmacy revealed copayment $276.02 for entire course of therapy (#196 tabs for 28 treatment days)  Medication acquisition will be discussed with patient.  Johny Drilling, PharmD, BCPS, BCOP  09/05/2017   2:35 PM Oral Oncology Clinic [phone]

## 2017-09-05 NOTE — Telephone Encounter (Signed)
Oral Oncology Pharmacist Encounter  Spoke with patient about Xeloda acquisition. Patient will pick-up entire course of therapy (#196 tabs for 28 treatment days) for copayment $276.02 tomorrow (09/06/17) while at [facility] for [place] appointment. Patient understands to wait until 09/17/17 (1st day of scheduled radiation) prior to starting Xeloda.  All questions answered. Patient expressed understanding and appreciation.  She knows to call the office with any additional questions or concerns.  Johny Drilling, PharmD, BCPS, BCOP  09/05/2017 3:35 PM Oral Oncology Clinic [phone]

## 2017-09-06 ENCOUNTER — Ambulatory Visit
Admission: RE | Admit: 2017-09-06 | Discharge: 2017-09-06 | Disposition: A | Discharge status: Home / Self Care | Payer: Medicare Other | Source: Ambulatory Visit | Attending: Radiation Oncology | Admitting: Radiation Oncology

## 2017-09-06 DIAGNOSIS — C20 Malignant neoplasm of rectum: Secondary | ICD-10-CM

## 2017-09-06 DIAGNOSIS — Z510 Encounter for antineoplastic radiation therapy: Secondary | ICD-10-CM | POA: Diagnosis not present

## 2017-09-10 NOTE — Progress Notes (Signed)
  Radiation Oncology         (336) [phone] ________________________________  Name: Samantha Lutz MRN: [id number]  Date: 09/06/2017  DOB: [birth year]   SIMULATION AND TREATMENT PLANNING NOTE  DIAGNOSIS:     ICD-10-CM   1. Adenocarcinoma of rectum ([place]) C20      The patient presented for simulation for the patient's upcoming course of radiation for the diagnosis of rectal cancer. The patient was placed in a supine position. A customized vac-lock bag was constructed to aid in patient immobilization on. This complex treatment device will be used on a daily basis during the treatment. In this fashion a CT scan was obtained through the pelvic region and the isocenter was placed near midline within the pelvis. Surface markings were placed.  The patient's imaging was loaded into the radiation treatment planning system. The patient will initially be planned to receive a course of radiation to a dose of 45 Gy. This will be accomplished in 25 fractions at 1.8 gray per fraction. This initial treatment will correspond to a 3-D conformal technique. The target has been contoured in addition to the rectum, bladder and femoral heads. Dose volume histograms of each of these structures have been requested and these will be carefully reviewed as part of the 3-D conformal treatment planning process. To accomplish this initial treatment, 4 customized blocks have been designed for this purpose. Each of these 4 complex treatment devices will be used on a daily basis during the initial course of the treatment. It is anticipated that the patient will then receive a boost for an additional 5.4 Gy. The anticipated total dose therefore will be 50.4 Gy.    Special treatment procedure The patient will receive chemotherapy during the course of radiation treatment. The patient may experience increased or overlapping toxicity due to this combined-modality approach and the patient will be monitored for such problems. This may  include extra lab work as necessary. This therefore constitutes a special treatment procedure.    ________________________________  Jodelle Gross, MD, PhD

## 2017-09-10 NOTE — Progress Notes (Signed)
  Radiation Oncology         (336) [phone] ________________________________  Name: ERABELLA KUIPERS MRN: [id number]  Date: 09/06/2017  DOB: 08-27-1950  Optical Surface Tracking Plan:  Since intensity modulated radiotherapy (IMRT) and 3D conformal radiation treatment methods are predicated on accurate and precise positioning for treatment, intrafraction motion monitoring is medically necessary to ensure accurate and safe treatment delivery.  The ability to quantify intrafraction motion without excessive ionizing radiation dose can only be performed with optical surface tracking. Accordingly, surface imaging offers the opportunity to obtain 3D measurements of patient position throughout IMRT and 3D treatments without excessive radiation exposure.  I am ordering optical surface tracking for this patient's upcoming course of radiotherapy. ________________________________  Kyung Rudd, MD 09/10/2017 11:35 AM    Reference:   Ursula Alert, J, et al. Surface imaging-based analysis of intrafraction motion for breast radiotherapy patients.Journal of [place], n. 6, nov. 2014. ISSN 39122583.   Available at: <http://www.jacmp.org/index.php/jacmp/article/view/4957>.

## 2017-09-11 DIAGNOSIS — Z510 Encounter for antineoplastic radiation therapy: Secondary | ICD-10-CM | POA: Diagnosis not present

## 2017-09-11 NOTE — Telephone Encounter (Signed)
Oral Oncology Patient Advocate Encounter  Confirmed with [place] that Xeloda was picked up 8-8 with a $276.02 copay.  [place] Patient [place] Phone [phone] Fax [phone]

## 2017-09-12 ENCOUNTER — Encounter: Payer: Self-pay | Admitting: General Practice

## 2017-09-12 NOTE — Progress Notes (Signed)
[place] Psychosocial Distress Screening Clinical Social Work  Clinical Social Work was referred by distress screening protocol.  The patient scored a 9 on the Psychosocial Distress Thermometer which indicates severe distress. Clinical Social Worker contacted patient by phone to assess for distress and other psychosocial needs. Unable to reach patient by phone, left generic VM requesting call back.    ONCBCN DISTRESS SCREENING 09/04/2017  Screening Type Initial Screening  Distress experienced in past week (1-10) 9  Emotional problem type Adjusting to illness;Feeling hopeless  Spiritual/Religous concerns type Facing my mortality  Physical Problem type Constipation/diarrhea  Other [phone]     Clinical Social Worker follow up needed: Yes.    If yes, follow up plan:  Await return call.  Beverely Pace, Phil [place], LCSW Clinical Social Worker Phone:  [phone]

## 2017-09-12 NOTE — Progress Notes (Signed)
[place] Psychosocial Distress Screening Clinical Social Work  Clinical Social Work was referred by distress screening protocol.  The patient scored a 9 on the Psychosocial Distress Thermometer which indicates severe distress. Clinical Social Worker contacted patient by phone to assess for distress and other psychosocial needs. CSW and patient discussed common feeling and emotions when being diagnosed with cancer, and the importance of support during treatment. CSW informed patient of the support team and support services at [facility]. CSW provided contact information and encouraged patient to call with any questions or concerns.  Patient has strong support from husband, children and grandchildren.  Cares for 69 year old granddaughter when school is out.  No concerns re transport or help at home.  Pt remembers completing Distress Screen prior to meeting w treatment team - having additional information about treatment has helped allay her anxiety somewhat.  Will mail information packet, encouraged patient to reach out as needed for support.    ONCBCN DISTRESS SCREENING 09/04/2017  Screening Type Initial Screening  Distress experienced in past week (1-10) 9  Emotional problem type Adjusting to illness;Feeling hopeless  Spiritual/Religous concerns type Facing my mortality  Physical Problem type Constipation/diarrhea  Other [phone]    Clinical Social Worker follow up needed: No.  If yes, follow up plan:  Beverely Pace, [place], LCSW Clinical Social Worker Phone:  [phone]

## 2017-09-16 ENCOUNTER — Ambulatory Visit: Admission: RE | Admit: 2017-09-16 | Payer: Medicare Other | Source: Ambulatory Visit | Admitting: Radiation Oncology

## 2017-09-17 ENCOUNTER — Ambulatory Visit
Admission: RE | Admit: 2017-09-17 | Discharge: 2017-09-17 | Disposition: A | Discharge status: Home / Self Care | Payer: Medicare Other | Source: Ambulatory Visit | Attending: Radiation Oncology | Admitting: Radiation Oncology

## 2017-09-17 DIAGNOSIS — Z510 Encounter for antineoplastic radiation therapy: Secondary | ICD-10-CM | POA: Diagnosis not present

## 2017-09-17 NOTE — Progress Notes (Signed)
Oncology Nurse Navigation Documentation  Met with patient in rad/onc prior to her first radiation tx to offer support and encouragement. Patient started her Xeloda this morning without incident. I reeducated on availability of pelvic floor rehab if she would like a referral. She will let me know. Patient encouraged to call with questions or concerns.  Arna Snipe, MS Ed.S, RN -[facility]  Gastrointestinal Oncology Nurse [place] [facility] [place] [phone]

## 2017-09-18 ENCOUNTER — Ambulatory Visit
Admission: RE | Admit: 2017-09-18 | Discharge: 2017-09-18 | Disposition: A | Discharge status: Home / Self Care | Payer: Medicare Other | Source: Ambulatory Visit | Attending: Radiation Oncology | Admitting: Radiation Oncology

## 2017-09-18 ENCOUNTER — Ambulatory Visit: Payer: Medicare Other | Admitting: Oncology

## 2017-09-18 DIAGNOSIS — Z510 Encounter for antineoplastic radiation therapy: Secondary | ICD-10-CM | POA: Diagnosis not present

## 2017-09-19 ENCOUNTER — Ambulatory Visit
Admission: RE | Admit: 2017-09-19 | Discharge: 2017-09-19 | Disposition: A | Discharge status: Home / Self Care | Payer: Medicare Other | Source: Ambulatory Visit | Attending: Radiation Oncology | Admitting: Radiation Oncology

## 2017-09-19 DIAGNOSIS — Z510 Encounter for antineoplastic radiation therapy: Secondary | ICD-10-CM | POA: Diagnosis not present

## 2017-09-20 ENCOUNTER — Ambulatory Visit
Admission: RE | Admit: 2017-09-20 | Discharge: 2017-09-20 | Disposition: A | Discharge status: Home / Self Care | Payer: Medicare Other | Source: Ambulatory Visit | Attending: Radiation Oncology | Admitting: Radiation Oncology

## 2017-09-20 DIAGNOSIS — Z510 Encounter for antineoplastic radiation therapy: Secondary | ICD-10-CM | POA: Diagnosis not present

## 2017-09-21 ENCOUNTER — Ambulatory Visit
Admission: RE | Admit: 2017-09-21 | Discharge: 2017-09-21 | Disposition: A | Discharge status: Home / Self Care | Payer: Medicare Other | Source: Ambulatory Visit | Attending: Radiation Oncology | Admitting: Radiation Oncology

## 2017-09-21 DIAGNOSIS — Z510 Encounter for antineoplastic radiation therapy: Secondary | ICD-10-CM | POA: Diagnosis not present

## 2017-09-21 NOTE — Progress Notes (Signed)
Pt here for patient teaching.  Pt given Radiation and You booklet and skin care instructions.  Reviewed areas of pertinence such as diarrhea, fatigue, nausea and vomiting, skin changes and urinary and bladder changes . Pt able to give teach back of to pat skin, use unscented/gentle soap, have Imodium on hand, drink plenty of water and sitz bath,. Pt demonstrated understanding, needs reinforcement, no evidence of learning, refused teaching and  of information given and will contact nursing with any questions or concerns.     Http://[email or web address]/whattoexpect/index

## 2017-09-24 ENCOUNTER — Ambulatory Visit
Admission: RE | Admit: 2017-09-24 | Discharge: 2017-09-24 | Disposition: A | Discharge status: Home / Self Care | Payer: Medicare Other | Source: Ambulatory Visit | Attending: Radiation Oncology | Admitting: Radiation Oncology

## 2017-09-24 DIAGNOSIS — Z510 Encounter for antineoplastic radiation therapy: Secondary | ICD-10-CM | POA: Diagnosis not present

## 2017-09-25 ENCOUNTER — Ambulatory Visit
Admission: RE | Admit: 2017-09-25 | Discharge: 2017-09-25 | Disposition: A | Discharge status: Home / Self Care | Payer: Medicare Other | Source: Ambulatory Visit | Attending: Radiation Oncology | Admitting: Radiation Oncology

## 2017-09-25 DIAGNOSIS — Z510 Encounter for antineoplastic radiation therapy: Secondary | ICD-10-CM | POA: Diagnosis not present

## 2017-09-26 ENCOUNTER — Ambulatory Visit
Admission: RE | Admit: 2017-09-26 | Discharge: 2017-09-26 | Disposition: A | Discharge status: Home / Self Care | Payer: Medicare Other | Source: Ambulatory Visit | Attending: Radiation Oncology | Admitting: Radiation Oncology

## 2017-09-26 DIAGNOSIS — Z510 Encounter for antineoplastic radiation therapy: Secondary | ICD-10-CM | POA: Diagnosis not present

## 2017-09-27 ENCOUNTER — Ambulatory Visit
Admission: RE | Admit: 2017-09-27 | Discharge: 2017-09-27 | Disposition: A | Discharge status: Home / Self Care | Payer: Medicare Other | Source: Ambulatory Visit | Attending: Radiation Oncology | Admitting: Radiation Oncology

## 2017-09-27 DIAGNOSIS — Z510 Encounter for antineoplastic radiation therapy: Secondary | ICD-10-CM | POA: Diagnosis not present

## 2017-09-28 ENCOUNTER — Inpatient Hospital Stay: Payer: Medicare Other

## 2017-09-28 ENCOUNTER — Inpatient Hospital Stay (HOSPITAL_BASED_OUTPATIENT_CLINIC_OR_DEPARTMENT_OTHER): Payer: Medicare Other | Admitting: Nurse Practitioner

## 2017-09-28 ENCOUNTER — Telehealth: Payer: Self-pay | Admitting: Nurse Practitioner

## 2017-09-28 ENCOUNTER — Encounter: Payer: Self-pay | Admitting: Nurse Practitioner

## 2017-09-28 ENCOUNTER — Ambulatory Visit
Admission: RE | Admit: 2017-09-28 | Discharge: 2017-09-28 | Disposition: A | Discharge status: Home / Self Care | Payer: Medicare Other | Source: Ambulatory Visit | Attending: Radiation Oncology | Admitting: Radiation Oncology

## 2017-09-28 VITALS — BP 159/76 | HR 95 | Temp 98.6°F | Resp 18 | Ht 63.0 in | Wt 244.3 lb

## 2017-09-28 DIAGNOSIS — C20 Malignant neoplasm of rectum: Secondary | ICD-10-CM | POA: Diagnosis not present

## 2017-09-28 DIAGNOSIS — I10 Essential (primary) hypertension: Secondary | ICD-10-CM

## 2017-09-28 DIAGNOSIS — M1990 Unspecified osteoarthritis, unspecified site: Secondary | ICD-10-CM

## 2017-09-28 DIAGNOSIS — R110 Nausea: Secondary | ICD-10-CM | POA: Diagnosis not present

## 2017-09-28 DIAGNOSIS — Z79899 Other long term (current) drug therapy: Secondary | ICD-10-CM

## 2017-09-28 DIAGNOSIS — K760 Fatty (change of) liver, not elsewhere classified: Secondary | ICD-10-CM | POA: Diagnosis not present

## 2017-09-28 DIAGNOSIS — E039 Hypothyroidism, unspecified: Secondary | ICD-10-CM

## 2017-09-28 DIAGNOSIS — Z510 Encounter for antineoplastic radiation therapy: Secondary | ICD-10-CM | POA: Diagnosis not present

## 2017-09-28 LAB — CBC WITH DIFFERENTIAL (CANCER CENTER ONLY)
Basophils Absolute: 0 10*3/uL (ref 0.0–0.1)
Basophils Relative: 0 %
Eosinophils Absolute: 0.2 10*3/uL (ref 0.0–0.5)
Eosinophils Relative: 4 %
HCT: 36.8 % (ref 34.8–46.6)
Hemoglobin: 12.3 g/dL (ref 11.6–15.9)
Lymphocytes Relative: 24 %
Lymphs Abs: 0.9 10*3/uL (ref 0.9–3.3)
MCH: 29.8 pg (ref 25.1–34.0)
MCHC: 33.3 g/dL (ref 31.5–36.0)
MCV: 89.3 fL (ref 79.5–101.0)
Monocytes Absolute: 0.3 10*3/uL (ref 0.1–0.9)
Monocytes Relative: 8 %
Neutro Abs: 2.5 10*3/uL (ref 1.5–6.5)
Neutrophils Relative %: 64 %
Platelet Count: 150 10*3/uL (ref 145–400)
RBC: 4.12 MIL/uL (ref 3.70–5.45)
RDW: 13.4 % (ref 11.2–14.5)
WBC Count: 3.9 10*3/uL (ref 3.9–10.3)

## 2017-09-28 NOTE — Progress Notes (Signed)
  [facility] OFFICE PROGRESS NOTE   Diagnosis: Rectal cancer  INTERVAL HISTORY:   Ms. Savin returns as scheduled.  She began radiation/Xeloda 09/17/2017.  She has had a single episode of nausea.  No vomiting.  No mouth sores.  No diarrhea.  She describes stools as "soft".  She intermittently notes a mucousy/bloody rectal discharge.  No pain with bowel movements.  No hand or foot pain or redness.  She continues to have pressure at the rectum.  She describes her appetite as "okay".  Objective:  Vital signs in last 24 hours:  Blood pressure (!) 159/76, pulse 95, temperature 98.6 F (37 C), temperature source Oral, resp. rate 18, height 5\' 3"  (1.6 m), weight 244 lb 4.8 oz (110.8 kg), SpO2 100 %.    HEENT: No thrush or ulcers. Resp: Lungs clear bilaterally. Cardio: Regular rate and rhythm. GI: Abdomen soft and nontender.  No hepatomegaly. Vascular: No leg edema.  Skin: Palms without erythema.   Lab Results:  Lab Results  Component Value Date   WBC 3.9 09/28/2017   HGB 12.3 09/28/2017   HCT 36.8 09/28/2017   MCV 89.3 09/28/2017   PLT 150 09/28/2017   NEUTROABS 2.5 09/28/2017    Imaging:  No results found.  Medications: I have reviewed the patient's current medications.  Assessment/Plan: 1. Rectal cancer ? Mass at 7 cm from the anal verge on colonoscopy 08/09/2017, biopsy revealed invasive adenocarcinoma ? Staging CTs 08/17/2017-no evidence of metastatic disease, asymmetric thickening in the mid rectum ? MR pelvis 09/01/2017, T3N0 lesion beginning at 6.3 cm from the anal sphincter ? Radiation/Xeloda initiated 09/17/2017  2.   Hypothyroid   Disposition: Ms. Bancroft appears stable.  She seems to be tolerating the Xeloda well.  Plan to continue the same.    We reviewed the CBC from today.  Counts are adequate to continue.  She will return for lab and follow-up in 2 weeks.  She will contact the office in the interim with any problems.   Ned Card  ANP/GNP-BC   09/28/2017  10:17 AM

## 2017-09-28 NOTE — Telephone Encounter (Signed)
lvm for patient with appt date and time per 8/30 los.

## 2017-10-02 ENCOUNTER — Ambulatory Visit
Admission: RE | Admit: 2017-10-02 | Discharge: 2017-10-02 | Disposition: A | Discharge status: Home / Self Care | Payer: Medicare Other | Source: Ambulatory Visit | Attending: Radiation Oncology | Admitting: Radiation Oncology

## 2017-10-02 DIAGNOSIS — Z510 Encounter for antineoplastic radiation therapy: Secondary | ICD-10-CM | POA: Insufficient documentation

## 2017-10-02 DIAGNOSIS — C20 Malignant neoplasm of rectum: Secondary | ICD-10-CM | POA: Diagnosis present

## 2017-10-03 ENCOUNTER — Ambulatory Visit
Admission: RE | Admit: 2017-10-03 | Discharge: 2017-10-03 | Disposition: A | Discharge status: Home / Self Care | Payer: Medicare Other | Source: Ambulatory Visit | Attending: Radiation Oncology | Admitting: Radiation Oncology

## 2017-10-03 DIAGNOSIS — C20 Malignant neoplasm of rectum: Secondary | ICD-10-CM | POA: Diagnosis not present

## 2017-10-04 ENCOUNTER — Ambulatory Visit
Admission: RE | Admit: 2017-10-04 | Discharge: 2017-10-04 | Disposition: A | Discharge status: Home / Self Care | Payer: Medicare Other | Source: Ambulatory Visit | Attending: Radiation Oncology | Admitting: Radiation Oncology

## 2017-10-04 DIAGNOSIS — C20 Malignant neoplasm of rectum: Secondary | ICD-10-CM | POA: Diagnosis not present

## 2017-10-05 ENCOUNTER — Ambulatory Visit
Admission: RE | Admit: 2017-10-05 | Discharge: 2017-10-05 | Disposition: A | Discharge status: Home / Self Care | Payer: Medicare Other | Source: Ambulatory Visit | Attending: Radiation Oncology | Admitting: Radiation Oncology

## 2017-10-05 DIAGNOSIS — C20 Malignant neoplasm of rectum: Secondary | ICD-10-CM | POA: Diagnosis not present

## 2017-10-08 ENCOUNTER — Ambulatory Visit
Admission: RE | Admit: 2017-10-08 | Discharge: 2017-10-08 | Disposition: A | Discharge status: Home / Self Care | Payer: Medicare Other | Source: Ambulatory Visit | Attending: Radiation Oncology | Admitting: Radiation Oncology

## 2017-10-08 DIAGNOSIS — C20 Malignant neoplasm of rectum: Secondary | ICD-10-CM | POA: Diagnosis not present

## 2017-10-09 ENCOUNTER — Ambulatory Visit
Admission: RE | Admit: 2017-10-09 | Discharge: 2017-10-09 | Disposition: A | Discharge status: Home / Self Care | Payer: Medicare Other | Source: Ambulatory Visit | Attending: Radiation Oncology | Admitting: Radiation Oncology

## 2017-10-09 DIAGNOSIS — C20 Malignant neoplasm of rectum: Secondary | ICD-10-CM | POA: Diagnosis not present

## 2017-10-10 ENCOUNTER — Ambulatory Visit
Admission: RE | Admit: 2017-10-10 | Discharge: 2017-10-10 | Disposition: A | Discharge status: Home / Self Care | Payer: Medicare Other | Source: Ambulatory Visit | Attending: Radiation Oncology | Admitting: Radiation Oncology

## 2017-10-10 DIAGNOSIS — C20 Malignant neoplasm of rectum: Secondary | ICD-10-CM | POA: Diagnosis not present

## 2017-10-11 ENCOUNTER — Ambulatory Visit
Admission: RE | Admit: 2017-10-11 | Discharge: 2017-10-11 | Disposition: A | Discharge status: Home / Self Care | Payer: Medicare Other | Source: Ambulatory Visit | Attending: Radiation Oncology | Admitting: Radiation Oncology

## 2017-10-11 DIAGNOSIS — C20 Malignant neoplasm of rectum: Secondary | ICD-10-CM | POA: Diagnosis not present

## 2017-10-12 ENCOUNTER — Encounter: Payer: Self-pay | Admitting: Nurse Practitioner

## 2017-10-12 ENCOUNTER — Inpatient Hospital Stay: Payer: Medicare Other

## 2017-10-12 ENCOUNTER — Inpatient Hospital Stay: Payer: Medicare Other | Attending: Oncology | Admitting: Nurse Practitioner

## 2017-10-12 ENCOUNTER — Telehealth: Payer: Self-pay | Admitting: Hematology

## 2017-10-12 ENCOUNTER — Ambulatory Visit
Admission: RE | Admit: 2017-10-12 | Discharge: 2017-10-12 | Disposition: A | Discharge status: Home / Self Care | Payer: Medicare Other | Source: Ambulatory Visit | Attending: Radiation Oncology | Admitting: Radiation Oncology

## 2017-10-12 VITALS — BP 144/60 | HR 92 | Temp 98.2°F | Resp 18 | Ht 63.0 in | Wt 244.3 lb

## 2017-10-12 DIAGNOSIS — Z79899 Other long term (current) drug therapy: Secondary | ICD-10-CM | POA: Insufficient documentation

## 2017-10-12 DIAGNOSIS — C20 Malignant neoplasm of rectum: Secondary | ICD-10-CM | POA: Insufficient documentation

## 2017-10-12 DIAGNOSIS — R197 Diarrhea, unspecified: Secondary | ICD-10-CM

## 2017-10-12 DIAGNOSIS — Z923 Personal history of irradiation: Secondary | ICD-10-CM | POA: Diagnosis not present

## 2017-10-12 DIAGNOSIS — D696 Thrombocytopenia, unspecified: Secondary | ICD-10-CM | POA: Diagnosis not present

## 2017-10-12 DIAGNOSIS — E039 Hypothyroidism, unspecified: Secondary | ICD-10-CM | POA: Insufficient documentation

## 2017-10-12 LAB — CMP (CANCER CENTER ONLY)
ALT: 19 U/L (ref 0–44)
AST: 25 U/L (ref 15–41)
Albumin: 3.5 g/dL (ref 3.5–5.0)
Alkaline Phosphatase: 60 U/L (ref 38–126)
Anion gap: 9 (ref 5–15)
BUN: 13 mg/dL (ref 8–23)
CO2: 24 mmol/L (ref 22–32)
Calcium: 8.9 mg/dL (ref 8.9–10.3)
Chloride: 108 mmol/L (ref 98–111)
Creatinine: 0.91 mg/dL (ref 0.44–1.00)
GFR, Est AFR Am: >60 mL/min (ref >60)
GFR, Estimated: >60 mL/min (ref >60)
Glucose, Bld: 161 mg/dL — ABNORMAL HIGH (ref 70–99)
Potassium: 3.7 mmol/L (ref 3.5–5.1)
Sodium: 141 mmol/L (ref 135–145)
Total Bilirubin: 0.9 mg/dL (ref 0.3–1.2)
Total Protein: 6.7 g/dL (ref 6.5–8.1)

## 2017-10-12 LAB — CBC WITH DIFFERENTIAL (CANCER CENTER ONLY)
Basophils Absolute: 0 10*3/uL (ref 0.0–0.1)
Basophils Relative: 0 %
Eosinophils Absolute: 0.2 10*3/uL (ref 0.0–0.5)
Eosinophils Relative: 3 %
HCT: 35.6 % (ref 34.8–46.6)
Hemoglobin: 12.2 g/dL (ref 11.6–15.9)
Lymphocytes Relative: 12 %
Lymphs Abs: 0.7 10*3/uL — ABNORMAL LOW (ref 0.9–3.3)
MCH: 30.8 pg (ref 25.1–34.0)
MCHC: 34.3 g/dL (ref 31.5–36.0)
MCV: 89.9 fL (ref 79.5–101.0)
Monocytes Absolute: 0.4 10*3/uL (ref 0.1–0.9)
Monocytes Relative: 7 %
Neutro Abs: 4.4 10*3/uL (ref 1.5–6.5)
Neutrophils Relative %: 78 %
Platelet Count: 99 10*3/uL — ABNORMAL LOW (ref 145–400)
RBC: 3.96 MIL/uL (ref 3.70–5.45)
RDW: 16.6 % — ABNORMAL HIGH (ref 11.2–14.5)
WBC Count: 5.7 10*3/uL (ref 3.9–10.3)

## 2017-10-12 NOTE — Progress Notes (Addendum)
  [facility] OFF[facility] PROGRESS NOTE   Diagnosis: Rectal cancer  INTERVAL HISTORY:   Samantha Lutz returns as scheduled.  She continues radiation/Xeloda.  She denies nausea/vomiting.  No mouth sores.  She is having mainly small volume frequent loose stools.  She takes Imodium as needed.  She is no longer having rectal bleeding.  No hand or foot pain or redness.  Objective:  Vital signs in last 24 hours:  Blood pressure (!) 144/60, pulse 92, temperature 98.2 F (36.8 C), temperature source Oral, resp. rate 18, height 5\' 3"  (1.6 m), weight 244 lb 4.8 oz (110.8 kg), SpO2 100 %.    HEENT: No thrush or ulcers. Resp: Lungs clear bilaterally. Cardio: Regular rate and rhythm. GI: Abdomen soft and nontender.  No hepatomegaly. Vascular: No leg edema.  Skin: Palms without erythema.   Lab Results:  Lab Results  Component Value Date   WBC 5.7 10/12/2017   HGB 12.2 10/12/2017   HCT 35.6 10/12/2017   MCV 89.9 10/12/2017   PLT 99 (L) 10/12/2017   NEUTROABS 4.4 10/12/2017    Imaging:  No results found.  Medications: I have reviewed the patient's current medications.  Assessment/Plan: 1. Rectal cancer ? Mass at 7 cm from the anal verge on colonoscopy 08/09/2017, biopsy revealed invasive adenocarcinoma ? Staging CTs 08/17/2017-no evidence of metastatic disease, asymmetric thickening in the mid rectum ? MR pelvis 09/01/2017, T3N0 lesion beginning at 6.3 cm from the anal sphincter ? Radiation/Xeloda initiated 09/17/2017  2.Hypothyroid   Disposition: [name] appears stable.  She continues Xeloda/radiation.  She seems to be tolerating the Xeloda well.  We reviewed the CBC from today.  She has mild thrombocytopenia.  She understands to contact the office with any bleeding.  She will return for a repeat CBC in 1 week.  She is scheduled to complete the course of radiation on 10/25/2017.  We will see her in follow-up about 2 weeks after she has completed treatment.  She  will contact the office in the interim as outlined above or with any other problems.  Patient seen with Dr. Benay Spice.    Ned Card ANP/GNP-BC   10/12/2017  10:49 AM  This was a shared visit with Ned Card.  [name] is tolerating the Xeloda and radiation well.  She will continue the current treatment.  She will contact us for new symptoms.  Julieanne Manson, MD

## 2017-10-12 NOTE — Telephone Encounter (Signed)
Mistake - Ned Card / [facility] patient .- not Truitt Merle   Scheduled appt per 9/13 los - sent reminder letter in the mail with appt date and time and left vmail.

## 2017-10-15 ENCOUNTER — Ambulatory Visit
Admission: RE | Admit: 2017-10-15 | Discharge: 2017-10-15 | Disposition: A | Discharge status: Home / Self Care | Payer: Medicare Other | Source: Ambulatory Visit | Attending: Radiation Oncology | Admitting: Radiation Oncology

## 2017-10-15 DIAGNOSIS — C20 Malignant neoplasm of rectum: Secondary | ICD-10-CM | POA: Diagnosis not present

## 2017-10-16 ENCOUNTER — Ambulatory Visit
Admission: RE | Admit: 2017-10-16 | Discharge: 2017-10-16 | Disposition: A | Discharge status: Home / Self Care | Payer: Medicare Other | Source: Ambulatory Visit | Attending: Radiation Oncology | Admitting: Radiation Oncology

## 2017-10-16 DIAGNOSIS — C20 Malignant neoplasm of rectum: Secondary | ICD-10-CM | POA: Diagnosis not present

## 2017-10-17 ENCOUNTER — Ambulatory Visit
Admission: RE | Admit: 2017-10-17 | Discharge: 2017-10-17 | Disposition: A | Discharge status: Home / Self Care | Payer: Medicare Other | Source: Ambulatory Visit | Attending: Radiation Oncology | Admitting: Radiation Oncology

## 2017-10-17 DIAGNOSIS — C20 Malignant neoplasm of rectum: Secondary | ICD-10-CM | POA: Diagnosis not present

## 2017-10-18 ENCOUNTER — Ambulatory Visit
Admission: RE | Admit: 2017-10-18 | Discharge: 2017-10-18 | Disposition: A | Discharge status: Home / Self Care | Payer: Medicare Other | Source: Ambulatory Visit | Attending: Radiation Oncology | Admitting: Radiation Oncology

## 2017-10-18 DIAGNOSIS — C20 Malignant neoplasm of rectum: Secondary | ICD-10-CM | POA: Diagnosis not present

## 2017-10-19 ENCOUNTER — Inpatient Hospital Stay: Payer: Medicare Other

## 2017-10-19 ENCOUNTER — Ambulatory Visit
Admission: RE | Admit: 2017-10-19 | Discharge: 2017-10-19 | Disposition: A | Discharge status: Home / Self Care | Payer: Medicare Other | Source: Ambulatory Visit | Attending: Radiation Oncology | Admitting: Radiation Oncology

## 2017-10-19 DIAGNOSIS — C20 Malignant neoplasm of rectum: Secondary | ICD-10-CM

## 2017-10-19 LAB — CBC WITH DIFFERENTIAL (CANCER CENTER ONLY)
Basophils Absolute: 0 10*3/uL (ref 0.0–0.1)
Basophils Relative: 0 %
Eosinophils Absolute: 0.2 10*3/uL (ref 0.0–0.5)
Eosinophils Relative: 4 %
HCT: 34.6 % — ABNORMAL LOW (ref 34.8–46.6)
Hemoglobin: 11.9 g/dL (ref 11.6–15.9)
Lymphocytes Relative: 11 %
Lymphs Abs: 0.6 10*3/uL — ABNORMAL LOW (ref 0.9–3.3)
MCH: 31.7 pg (ref 25.1–34.0)
MCHC: 34.4 g/dL (ref 31.5–36.0)
MCV: 92.3 fL (ref 79.5–101.0)
Monocytes Absolute: 0.5 10*3/uL (ref 0.1–0.9)
Monocytes Relative: 11 %
Neutro Abs: 3.5 10*3/uL (ref 1.5–6.5)
Neutrophils Relative %: 74 %
Platelet Count: 112 10*3/uL — ABNORMAL LOW (ref 145–400)
RBC: 3.75 MIL/uL (ref 3.70–5.45)
RDW: 18.5 % — ABNORMAL HIGH (ref 11.2–14.5)
WBC Count: 4.8 10*3/uL (ref 3.9–10.3)

## 2017-10-19 NOTE — Progress Notes (Signed)
Patient walked in after seeing an MD in radiation today. She did NOT discuss this with MD [facility] today "I didn't think about it then". She reports R foot pain and was assessed by myself in lobby. She noticed R great toe "pain and stiffness " a few nights ago that is better but persists intermittently. She has good pulses, NO edema or redness to either foot. NO other foot or hand pain at all. Told patient I had consulted with Lattie Haw NP that has seen her prior and if she is ok she should follow up with her PCP for this pain as it appears to be unrelated to her rectal cancer or treatment. I did tell her that although our symptom management clinic is closed today, it will be open next week when she comes again for radiation. She was educated on what is emergent if this pain should spread or worsen and when to go to seek emergency medical attention. She agreed.

## 2017-10-22 ENCOUNTER — Ambulatory Visit
Admission: RE | Admit: 2017-10-22 | Discharge: 2017-10-22 | Disposition: A | Discharge status: Home / Self Care | Payer: Medicare Other | Source: Ambulatory Visit | Attending: Radiation Oncology | Admitting: Radiation Oncology

## 2017-10-22 DIAGNOSIS — C20 Malignant neoplasm of rectum: Secondary | ICD-10-CM | POA: Diagnosis not present

## 2017-10-23 ENCOUNTER — Ambulatory Visit: Payer: Self-pay | Admitting: Surgery

## 2017-10-23 ENCOUNTER — Ambulatory Visit
Admission: RE | Admit: 2017-10-23 | Discharge: 2017-10-23 | Disposition: A | Discharge status: Home / Self Care | Payer: Medicare Other | Source: Ambulatory Visit | Attending: Radiation Oncology | Admitting: Radiation Oncology

## 2017-10-23 DIAGNOSIS — C20 Malignant neoplasm of rectum: Secondary | ICD-10-CM | POA: Diagnosis not present

## 2017-10-24 ENCOUNTER — Ambulatory Visit
Admission: RE | Admit: 2017-10-24 | Discharge: 2017-10-24 | Disposition: A | Discharge status: Home / Self Care | Payer: Medicare Other | Source: Ambulatory Visit | Attending: Radiation Oncology | Admitting: Radiation Oncology

## 2017-10-24 DIAGNOSIS — C20 Malignant neoplasm of rectum: Secondary | ICD-10-CM | POA: Diagnosis not present

## 2017-10-25 ENCOUNTER — Encounter: Payer: Self-pay | Admitting: Radiation Oncology

## 2017-10-25 ENCOUNTER — Ambulatory Visit
Admission: RE | Admit: 2017-10-25 | Discharge: 2017-10-25 | Disposition: A | Discharge status: Home / Self Care | Payer: Medicare Other | Source: Ambulatory Visit | Attending: Radiation Oncology | Admitting: Radiation Oncology

## 2017-10-25 DIAGNOSIS — C20 Malignant neoplasm of rectum: Secondary | ICD-10-CM | POA: Diagnosis not present

## 2017-10-30 NOTE — Progress Notes (Signed)
  Radiation Oncology         [phone] ________________________________  Name: Samantha Lutz MRN: [id number]  Date: 10/25/2017  DOB: 1950/07/29  End of Treatment Note  Diagnosis:   67 y.o. female with Stage IIA, cT3N0M0 adenocarcinoma of the rectum     Indication for treatment:  Curative       Radiation treatment dates:   09/17/2017 - 10/25/2017  Site/dose:   The rectum was initially treated to 45 Gy in 25 fractions, followed by a 5.4 Gy boost delivered in 3 fractions, to yield a final total dose of 50.4 Gy.  Beams/energy:   3D / 15X, 10X Photon  Narrative: The patient tolerated radiation treatment relatively well.  She did experience some skin irritation in the treated area and is applying A&D ointment. She also had some increased fatigue, nausea, and diarrhea. She denied any urinary issues, vaginal or rectal bleeding.  Plan: The patient has completed radiation treatment. The patient will return to radiation oncology clinic for routine followup in one month. I advised them to call or return sooner if they have any questions or concerns related to their recovery or treatment.  ------------------------------------------------  Jodelle Gross, MD, PhD  This document serves as a record of services personally performed by Kyung Rudd, MD. It was created on his behalf by Rae Lips, a trained medical scribe. The creation of this record is based on the scribe's personal observations and the provider's statements to them. This document has been checked and approved by the attending provider.

## 2017-11-06 ENCOUNTER — Other Ambulatory Visit: Payer: Self-pay | Admitting: Family

## 2017-11-12 ENCOUNTER — Inpatient Hospital Stay: Payer: Medicare Other | Attending: Oncology | Admitting: Oncology

## 2017-11-12 ENCOUNTER — Inpatient Hospital Stay: Payer: Medicare Other

## 2017-11-12 ENCOUNTER — Telehealth: Payer: Self-pay | Admitting: Oncology

## 2017-11-12 VITALS — BP 172/85 | HR 98 | Temp 98.6°F | Resp 18 | Ht 63.0 in | Wt 241.6 lb

## 2017-11-12 DIAGNOSIS — C20 Malignant neoplasm of rectum: Secondary | ICD-10-CM

## 2017-11-12 DIAGNOSIS — D6959 Other secondary thrombocytopenia: Secondary | ICD-10-CM | POA: Insufficient documentation

## 2017-11-12 DIAGNOSIS — Z9221 Personal history of antineoplastic chemotherapy: Secondary | ICD-10-CM

## 2017-11-12 DIAGNOSIS — Z923 Personal history of irradiation: Secondary | ICD-10-CM | POA: Insufficient documentation

## 2017-11-12 DIAGNOSIS — R197 Diarrhea, unspecified: Secondary | ICD-10-CM

## 2017-11-12 DIAGNOSIS — T451X5S Adverse effect of antineoplastic and immunosuppressive drugs, sequela: Secondary | ICD-10-CM

## 2017-11-12 DIAGNOSIS — E039 Hypothyroidism, unspecified: Secondary | ICD-10-CM | POA: Insufficient documentation

## 2017-11-12 LAB — CBC WITH DIFFERENTIAL (CANCER CENTER ONLY)
Abs Immature Granulocytes: 0.05 10*3/uL (ref 0.00–0.07)
Basophils Absolute: 0 10*3/uL (ref 0.0–0.1)
Basophils Relative: 0 %
Eosinophils Absolute: 0.3 10*3/uL (ref 0.0–0.5)
Eosinophils Relative: 7 %
HCT: 34.3 % — ABNORMAL LOW (ref 36.0–46.0)
Hemoglobin: 11.8 g/dL — ABNORMAL LOW (ref 12.0–15.0)
Immature Granulocytes: 1 %
Lymphocytes Relative: 14 %
Lymphs Abs: 0.7 10*3/uL (ref 0.7–4.0)
MCH: 32.7 pg (ref 26.0–34.0)
MCHC: 34.4 g/dL (ref 30.0–36.0)
MCV: 95 fL (ref 80.0–100.0)
Monocytes Absolute: 0.6 10*3/uL (ref 0.1–1.0)
Monocytes Relative: 13 %
Neutro Abs: 3.2 10*3/uL (ref 1.7–7.7)
Neutrophils Relative %: 65 %
Platelet Count: 93 10*3/uL — ABNORMAL LOW (ref 150–400)
RBC: 3.61 MIL/uL — ABNORMAL LOW (ref 3.87–5.11)
RDW: 19.1 % — ABNORMAL HIGH (ref 11.5–15.5)
WBC Count: 5 10*3/uL (ref 4.0–10.5)
nRBC: 0 % (ref 0.0–0.2)

## 2017-11-12 NOTE — Telephone Encounter (Signed)
Scheduled appt per 10/14 los - sent reminder letter in the mail with appt date  And time.   

## 2017-11-12 NOTE — Progress Notes (Signed)
  [facility] OFF[facility] PROGRESS NOTE   Diagnosis: Rectal cancer  INTERVAL HISTORY:   Samantha Lutz completed Xeloda and radiation 10/25/2017.  No rectal bleeding.  She feels well.  She has occasional diarrhea.  She is scheduled to see Dr. Johney Maine later this month.  Objective:  Vital signs in last 24 hours:  Blood pressure (!) 172/85, pulse 98, temperature 98.6 F (37 C), temperature source Oral, resp. rate 18, height 5\' 3"  (1.6 m), weight 241 lb 9.6 oz (109.6 kg), SpO2 99 %.    HEENT: No thrush or ulcers Lymphatics: No inguinal nodes Resp: Lungs clear bilaterally Cardio: Regular rate and rhythm GI: No hepatomegaly, nontender Vascular: No leg edema  Skin: Palms and soles without erythema    Lab Results:  Lab Results  Component Value Date   WBC 5.0 11/12/2017   HGB 11.8 (L) 11/12/2017   HCT 34.3 (L) 11/12/2017   MCV 95.0 11/12/2017   PLT 93 (L) 11/12/2017   NEUTROABS 3.2 11/12/2017    CMP  Lab Results  Component Value Date   NA 141 10/12/2017   K 3.7 10/12/2017   CL 108 10/12/2017   CO2 24 10/12/2017   GLUCOSE 161 (H) 10/12/2017   BUN 13 10/12/2017   CREATININE 0.91 10/12/2017   CALCIUM 8.9 10/12/2017   PROT 6.7 10/12/2017   ALBUMIN 3.5 10/12/2017   AST 25 10/12/2017   ALT 19 10/12/2017   ALKPHOS 60 10/12/2017   BILITOT 0.9 10/12/2017   GFRNONAA >60 10/12/2017   GFRAA >60 10/12/2017     Medications: I have reviewed the patient's current medications.   Assessment/Plan: 1. Rectal cancer ? Mass at 7 cm from the anal verge on colonoscopy 08/09/2017, biopsy revealed invasive adenocarcinoma ? Staging CTs 08/17/2017-no evidence of metastatic disease, asymmetric thickening in the mid rectum ? MR pelvis 09/01/2017, T3N0 lesion beginning at 6.3 cm from the anal sphincter ? Radiation/Xeloda initiated 09/17/2017, completed 10/25/2017  2.Hypothyroid  3.   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  She appears well.  She reports rectal surgery is scheduled for 12/21/2017.  She will return for an office visit here during the week of 01/07/2018 to discuss the surgical pathology and adjuvant treatment options.  She has mild thrombocytopenia.  This is likely secondary to chemotherapy and radiation and should improve over the next few weeks.  15 minutes were spent with the patient today.  The majority of the time was used for counseling and coordination of care.  Betsy Coder, MD  11/12/2017  10:53 AM

## 2017-11-26 ENCOUNTER — Ambulatory Visit: Payer: Self-pay | Admitting: Radiation Oncology

## 2017-11-28 ENCOUNTER — Other Ambulatory Visit: Payer: Self-pay

## 2017-11-28 ENCOUNTER — Ambulatory Visit
Admission: RE | Admit: 2017-11-28 | Discharge: 2017-11-28 | Disposition: A | Discharge status: Home / Self Care | Payer: Medicare Other | Source: Ambulatory Visit | Attending: Radiation Oncology | Admitting: Radiation Oncology

## 2017-11-28 ENCOUNTER — Encounter: Payer: Self-pay | Admitting: Radiation Oncology

## 2017-11-28 ENCOUNTER — Other Ambulatory Visit (INDEPENDENT_AMBULATORY_CARE_PROVIDER_SITE_OTHER): Payer: Medicare Other

## 2017-11-28 VITALS — BP 128/55 | HR 87 | Temp 99.2°F | Resp 20 | Ht 63.0 in | Wt 241.8 lb

## 2017-11-28 DIAGNOSIS — E039 Hypothyroidism, unspecified: Secondary | ICD-10-CM

## 2017-11-28 DIAGNOSIS — C20 Malignant neoplasm of rectum: Secondary | ICD-10-CM

## 2017-11-28 DIAGNOSIS — Z79890 Hormone replacement therapy (postmenopausal): Secondary | ICD-10-CM | POA: Insufficient documentation

## 2017-11-28 LAB — TSH: TSH: 3.07 u[IU]/mL (ref 0.35–4.50)

## 2017-11-28 NOTE — Progress Notes (Signed)
Radiation Oncology         [phone] ________________________________  Name: Samantha BARDSLEY MRN: [id number]  Date of Service: 11/28/2017 DOB: [birth year]  Post Treatment Note  CC: Lance Sell, NP  [name], MD  Diagnosis:  Stage IIA, cT3N0M0 adenocarcinoma of the rectum     Interval Since Last Radiation:  5 weeks   09/17/2017 - 10/25/2017: The rectum was initially treated to 45 Gy in 25 fractions, followed by a 5.4 Gy boost delivered in 3 fractions, to yield a final total dose of 50.4 Gy.  Narrative:  The patient returns today for routine follow-up.  The patient tolerated radiotherapy very well, she did not have any untoward side effects during treatment, she had mild skin irritation which has resolved, and she comes today for follow-up.  She is scheduled to undergo low anterior resection with Dr. gross on 12/21/2017.       ALLERGIES:  is allergic to epinephrine and losartan.  Meds: Current Outpatient Medications  Medication Sig Dispense Refill  . levothyroxine (SYNTHROID, LEVOTHROID) 50 MCG tablet TAKE 1 TABLET BY MOUTH ONCE DAILY 90 tablet 0  . capecitabine (XELODA) 500 MG tablet Take 4 tablets (2000mg ) by mouth in AM & 3 tabs (1500mg ) in PM, within 55min of finishing food. Take on radiation days only, M-F (Patient not taking: Reported on 11/12/2017) 196 tablet 0  . metroNIDAZOLE (FLAGYL) 500 MG tablet TAKE 2 TABLETS BY MOUTH AT 2 00 PM 3 00 PM AND 10 00 PM THE DAY PRIOR TO SURGERY  0  . Multiple Vitamin (MULTIVITAMIN) capsule Take 1 capsule by mouth daily.    Marland Kitchen neomycin (MYCIFRADIN) 500 MG tablet TAKE 2 TABLETS BY MOUTH AT 2 00 PM 3 00 PM AND 10 00 PM THE DAY PRIOR TO SURGERY  0  . prochlorperazine (COMPAZINE) 10 MG tablet Take 1 tablet (10 mg total) by mouth every 6 (six) hours as needed for nausea or vomiting. (Patient not taking: Reported on 10/12/2017) 30 tablet 0   Current Facility-Administered Medications  Medication Dose Route Frequency Provider Last Rate  Last Dose  . 0.9 %  sodium chloride infusion  500 mL Intravenous Continuous Doran Stabler, MD        Physical Findings:  height is 5\' 3"  (1.6 m) and weight is 241 lb 12.8 oz (109.7 kg). Her oral temperature is 99.2 F (37.3 C). Her blood pressure is 128/55 (abnormal) and her pulse is 87. Her respiration is 20 and oxygen saturation is 100%.  Pain Assessment Pain Score: 0-No pain/10 In general this is a well appearing Caucasian female in no acute distress. She's alert and oriented x4 and appropriate throughout the examination. Cardiopulmonary assessment is negative for acute distress and she exhibits normal effort.   Lab Findings: Lab Results  Component Value Date   WBC 5.0 11/12/2017   HGB 11.8 (L) 11/12/2017   HCT 34.3 (L) 11/12/2017   MCV 95.0 11/12/2017   PLT 93 (L) 11/12/2017     Radiographic Findings: No results found.  Impression/Plan: 1. Stage IIA, cT3N0M0 adenocarcinoma of the rectum.  The patient is doing well, and is ready to proceed with surgery in November.  She is recovering nicely from the effects of radiotherapy, and reports her bowels are almost back to normal in terms of caliber and consistency.  She will continue to follow-up with Dr. Benay Spice and Dr. gross that she moves forward with her surgery, we would be happy to see her back as needed moving forward.  She is in agreement with this plan.        Carola Rhine, PAC

## 2017-11-28 NOTE — Addendum Note (Signed)
Encounter addended by: Sherrlyn Hock, LPN on: [id number] 2:34 PM  Actions taken: Charge Capture section accepted

## 2017-11-28 NOTE — Addendum Note (Signed)
Encounter addended by: Malena Edman, RN on: 11/28/2017 2:03 PM  Actions taken: Charge Capture section accepted

## 2017-11-29 ENCOUNTER — Ambulatory Visit: Payer: Self-pay | Admitting: Surgery

## 2017-12-03 ENCOUNTER — Ambulatory Visit: Payer: Self-pay | Admitting: Surgery

## 2017-12-03 NOTE — H&P (View-Only) (Signed)
Lynetta Mare Documented: 11/29/2017 12:21 PM Location: [facility] Patient #: [id number] DOB: 1950/12/23 Married / Language: Cleophus Molt / Race: White Female  History of Present Illness Adin Hector MD; 12/03/2017 1:44 PM) The patient is a 67 year old female who presents with colorectal cancer. Note for "Colorectal cancer": ` ` ` Patient sent for surgical consultation at the request of Dr Loletha [facility]  Chief Complaint: Mid Rectal Cancer ` ` Patient returns today with her husband. She completed chemotherapy neoadjuvant radiation. Oral Xeloda. 10/25/2017 last dose.  She is scheduled for surgery on 12/21/2017  Comes today with her husband. Her bowels are normalizing. Appetite is okay. Her activity level is somewhat limited with her knee pain but she is walking moving around relatively well.  Prior visit: The patient is a pleasant woman that had a positive Cologuard test. Underwent colonoscopy through [facility] gastroenterology. Unfortunately, found to have a bulky mass in the mid rectum about 7 cm from the anal verge. Biopsy consistent with adenocarcinoma. Labs ordered. Tests ordered. Surgical consultation requested. She comes today with her daughter who works in the Physicist, medical. Her husband and granddaughter here later for the visit as well. Patient had C-sections and appendectomy but no other surgeries. She usually would move her bowels most days. More recently became more frequent. Since the colonoscopy, she's been a little constipated. Normally can walk at least a half hour running errands and [employer]. However with her worsening knee joint pain, she does not walk as far. However she does not have any history of stroke or nor cardiopulmonary issues. She is not diabetic. She does not smoke. She is morbidly obese but otherwise active. She does have some anxiety issues and is worried about being in a closed MRI for the pelvis. CT  chest/abdomen/pelvis shows no evidence of metastatic disease. CEA 0.5. No prior family history of any digestive tract colorectal cancer that she is aware of  No personal nor family history of GI/colon cancer, inflammatory bowel disease, irritable bowel syndrome, allergy such as Celiac Sprue, dietary/dairy problems, colitis, ulcers nor gastritis. No recent sick contacts/gastroenteritis. No travel outside the country. No changes in diet. No dysphagia to solids or liquids. No significant heartburn or reflux. No hematochezia, hematemesis, coffee ground emesis. No evidence of prior gastric/peptic ulceration.  (Review of systems as stated in this history (HPI) or in the review of systems. Otherwise all other 12 point ROS are negative) ` ` `   Problem List/Past Medical Adin Hector, MD; 11/29/2017 12:35 PM) RECTAL ADENOCARCINOMA (C20) PREOP COLON - ENCOUNTER FOR PREOPERATIVE EXAMINATION FOR GENERAL SURGICAL PROCEDURE (Q25.956)  Past Surgical History Adin Hector, MD; 11/29/2017 12:35 PM) Appendectomy Cesarean Section - Multiple Colon Polyp Removal - Colonoscopy  Diagnostic Studies History Adin Hector, MD; 11/29/2017 12:35 PM) Colonoscopy within last year Mammogram within last year Pap Smear >5 years ago  Allergies Malachi Bonds, CMA; 11/29/2017 12:21 PM) No Known Drug Allergies [08/21/2017]:  Medication History Malachi Bonds, CMA; 11/29/2017 12:21 PM) Levothyroxine Sodium (50MCG Tablet, Oral) Active. Multivitamin & Mineral (Oral) Active. Medications Reconciled  Social History Adin Hector, MD; 11/29/2017 12:35 PM) Caffeine use Carbonated beverages, Coffee, Tea. No alcohol use No drug use Tobacco use Never smoker.  Family History Adin Hector, MD; 11/29/2017 12:35 PM) Colon Polyps Father. Diabetes Mellitus Mother. Kidney Disease Sister. Respiratory Condition Mother.  Pregnancy / Birth History Adin Hector, MD; 11/29/2017  12:35 PM) Age at menarche 37 years. Age of menopause 74-50 Contraceptive  History Oral contraceptives. Gravida 2 Maternal age 5-25 Para 2  Other Problems Adin Hector, MD; 11/29/2017 12:35 PM) Arthritis Colon Cancer Thyroid Disease     Review of Systems Adin Hector, MD; 11/29/2017 12:36 PM) General Not Present- Appetite Loss, Chills, Fatigue, Fever, Night Sweats, Weight Gain and Weight Loss. Skin Present- Dryness. Not Present- Change in Wart/Mole, Hives, Jaundice, New Lesions, Non-Healing Wounds, Rash and Ulcer. HEENT Present- Ringing in the Ears. Not Present- Earache, Hearing Loss, Hoarseness, Nose Bleed, Oral Ulcers, Seasonal Allergies, Sinus Pain, Sore Throat, Visual Disturbances, Wears glasses/contact lenses and Yellow Eyes. Respiratory Not Present- Bloody sputum, Chronic Cough, Difficulty Breathing, Snoring and Wheezing. Breast Not Present- Breast Mass, Breast Pain, Nipple Discharge and Skin Changes. Cardiovascular Not Present- Chest Pain, Difficulty Breathing Lying Down, Leg Cramps, Palpitations, Rapid Heart Rate, Shortness of Breath and Swelling of Extremities. Gastrointestinal Present- Abdominal Pain, Bloody Stool and Change in Bowel Habits. Not Present- Bloating, Chronic diarrhea, Constipation, Difficulty Swallowing, Excessive gas, Gets full quickly at meals, Hemorrhoids, Indigestion, Nausea, Rectal Pain and Vomiting. Female Genitourinary Not Present- Frequency, Nocturia, Painful Urination, Pelvic Pain and Urgency. Musculoskeletal Present- Joint Pain. Not Present- Back Pain, Joint Stiffness, Muscle Pain, Muscle Weakness and Swelling of Extremities. Neurological Not Present- Decreased Memory, Fainting, Headaches, Numbness, Seizures, Tingling, Tremor, Trouble walking and Weakness. Psychiatric Not Present- Anxiety, Bipolar, Change in Sleep Pattern, Depression, Fearful and Frequent crying. Endocrine Not Present- Cold Intolerance, Excessive Hunger, Hair Changes, Heat  Intolerance, Hot flashes and New Diabetes. Hematology Not Present- Blood Thinners, Easy Bruising, Excessive bleeding, Gland problems, HIV and Persistent Infections.  Vitals (Chemira Jones CMA; 11/29/2017 12:21 PM) 11/29/2017 12:21 PM Weight: 241 lb Height: 63in Body Surface Area: 2.09 m Body Mass Index: 42.69 kg/m  Temp.: 99.14F(Oral)  Pulse: 104 (Regular)  BP: 160/80 (Sitting, Left Arm, Standard)      Physical Exam Adin Hector MD; 11/29/2017 12:42 PM)  General Mental Status-Alert. General Appearance-Not in acute distress, Not Sickly. Orientation-Oriented X3. Hydration-Well hydrated. Voice-Normal. Note: Mildly anxious but consolable. Not cachectic. Not sickly. Moves around easily/normally  Integumentary Global Assessment Normal Exam - Axillae: non-tender, no inflammation or ulceration, no drainage. and Distribution of scalp and body hair is normal. General Characteristics Temperature - normal warmth is noted.  Head and Neck Head-normocephalic, atraumatic with no lesions or palpable masses. Face Global Assessment - atraumatic, no absence of expression. Neck Global Assessment - no abnormal movements, no bruit auscultated on the right, no bruit auscultated on the left, no decreased range of motion, non-tender. Trachea-midline. Thyroid Gland Characteristics - non-tender.  Eye Eyeball - Left-Extraocular movements intact, No Nystagmus. Eyeball - Right-Extraocular movements intact, No Nystagmus. Cornea - Left-No Hazy. Cornea - Right-No Hazy. Sclera/Conjunctiva - Left-No scleral icterus, No Discharge. Sclera/Conjunctiva - Right-No scleral icterus, No Discharge. Pupil - Left-Direct reaction to light normal. Pupil - Right-Direct reaction to light normal.  ENMT Ears Pinna - Left - no drainage observed, no generalized tenderness observed. Right - no drainage observed, no generalized tenderness observed. Nose and  Sinuses Nose - no destructive lesion observed. Nares - Left - quiet respiration. Right - quiet respiration. Mouth and Throat Lips - Upper Lip - no fissures observed, no pallor noted. Lower Lip - no fissures observed, no pallor noted. Nasopharynx - no discharge present. Oral Cavity/Oropharynx - Tongue - no dryness observed. Oral Mucosa - no cyanosis observed. Hypopharynx - no evidence of airway distress observed.  Chest and Lung Exam Inspection Movements - Normal and Symmetrical. Accessory muscles - No use of accessory  muscles in breathing. Palpation Normal exam - Non-tender. Auscultation Breath sounds - Normal and Clear.  Cardiovascular Auscultation Rhythm - Regular. Murmurs & Other Heart Sounds - Normal exam - No Murmurs and No Systolic Clicks.  Abdomen Inspection Normal Exam - No Visible peristalsis and No Abnormal pulsations. Umbilicus - No Bleeding, No Urine drainage. Palpation/Percussion Normal exam - Soft, Non Tender, No Rebound tenderness, No Rigidity (guarding) and No Cutaneous hyperesthesia. Note: Abdomen obese but soft. Not distended. No distasis recti. No umbilical or other anterior abdominal wall hernias  Female Genitourinary Sexual Maturity Tanner 5 - Adult hair pattern. Note: No vaginal bleeding nor discharge. Minimal panniculus with good hygiene. No inguinal hernias. No lymphadenopathy.  Rectal Note: Mid rectal mass felt about 8 cm from the sphincters primarily involving the left anterolateral sidewall. Smaller. More like 20% of the circumference this time. Remains mobile. Much less tender.  Peripheral Vascular Upper Extremity Inspection - Left - No Cyanotic nailbeds, Not Ischemic. Right - No Cyanotic nailbeds, Not Ischemic.  Neurologic Neurologic evaluation reveals -normal attention span and ability to concentrate, able to name objects and repeat phrases. Appropriate fund of knowledge , normal sensation and normal coordination. Mental Status Affect -  not angry, not paranoid. Cranial Nerves-Normal Bilaterally. Gait-Normal.  Neuropsychiatric Mental status exam performed with findings of-able to articulate well with normal speech/language, rate, volume and coherence, thought content normal with ability to perform basic computations and apply abstract reasoning and no evidence of hallucinations, delusions, obsessions or homicidal/suicidal ideation.  Musculoskeletal Global Assessment Spine, Ribs and Pelvis - no instability, subluxation or laxity. Right Upper Extremity - no instability, subluxation or laxity.  Lymphatic Head & Neck General Head & Neck Lymphatics: Bilateral - Description - No Localized lymphadenopathy. Axillary General Axillary Region: Bilateral - Description - No Localized lymphadenopathy. Femoral & Inguinal Generalized Femoral & Inguinal Lymphatics: Left: Right - Description - No Localized lymphadenopathy. Description - No Localized lymphadenopathy.    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 Highly suspicious for at least a T3 lesion. No evidence of distant metastatic disease by CT of chest/abdomen/pelvis. CEA 0.5  Recovering status post neoadjuvant chemoradiation therapy for T3 mass.  Oral Xeloda and radiation together over 5.5 week course.  Then a low anterior resection 10 weeks later. Robotically-assisted approach. I suspect given her obesity and the tumor being palpable by my finger, most likely the anastomosis will be within 5 cm and require a protective loop ileostomy temporarily for sphincter sparing surgery. She is not a smoker and is female, but she is obese. Usually colorectal EEA anastomosis ends up lower than your think in morbidly obese folks. We will see.  Again went over the surgery in detail. We will get her involved in the enhance recovery pathway. Preoperative education visit.  She is scheduled  for surgery on 12/21/2017  The anatomy & physiology of the digestive tract was discussed.  The pathophysiology of the colon was discussed.  Natural history risks without surgery was discussed.   I feel the risks of no intervention will lead to serious problems that outweigh the operative risks; therefore, I recommended a partial colectomy to remove the pathology.  Minimally invasive (Robotic/Laparoscopic) & open techniques were discussed.   Risks such as bleeding, infection, abscess, leak, reoperation, injury to other organs, need for repair of tissues / organs, possible ostomy, hernia, heart attack, stroke, death, and other risks were discussed.  I noted a good likelihood this  will help address the problem.   Goals of post-operative recovery were discussed as well.   Need for adequate nutrition, daily bowel regimen and healthy physical activity, to optimize recovery was noted as well. We will work to minimize complications.  Educational materials were available as well.  Questions were answered.  The patient expresses understanding & wishes to proceed with surgery.   Current Plans Pt Education - [facility] Ostomy HCI (Rasaan Brotherton): discussed with patient and provided information. You are being scheduled for surgery- Our schedulers will call you.  You should hear from our office's scheduling department within 5 working days about the location, date, and time of surgery. We try to make accommodations for patient's preferences in scheduling surgery, but sometimes the OR schedule or the surgeon's schedule prevents [place] from making those accommodations.  If you have not heard from our office [phone]) in 5 working days, call the office and ask for your surgeon's nurse.  If you have other questions about your diagnosis, plan, or surgery, call the office and ask for your surgeon's nurse.   PREOP COLON - ENCOUNTER FOR PREOPERATIVE EXAMINATION FOR GENERAL SURGICAL PROCEDURE (S50.539)  Current Plans Written  instructions provided Pt Education - [facility] Colon Bowel Prep 2018 ERAS/Miralax/Antibiotics Restarted Neomycin Sulfate 500 MG Oral Tablet, 2 (two) Tablet SEE NOTE, #6, 11/29/2017, No Refill. Local Order: TAKE TWO TABLETS AT 2 PM, 3 PM, AND 10 PM THE DAY PRIOR TO SURGERY Restarted Flagyl 500 MG Oral Tablet, 2 (two) Tablet SEE NOTE, #6, 11/29/2017, No Refill. Local Order: Take at 2pm, 3pm, and 10pm the day prior to your colon operation Pt Education - [facility] Colectomy post-op instructions: discussed with patient and provided information.  Adin Hector, MD, FACS, MASCRS Gastrointestinal and Minimally Invasive Surgery    [address] [place], [place] [address] [phone] Main / Paging [phone] Fax

## 2017-12-03 NOTE — H&P (Signed)
Samantha Lutz Documented: 11/29/2017 12:21 PM Location: [facility] Patient #: [id number] DOB: 23-Jan-1951 Married / Language: Cleophus Molt / Race: White Female  History of Present Illness Samantha Hector MD; 12/03/2017 1:44 PM) The patient is a 67 year old female who presents with colorectal cancer. Note for "Colorectal cancer": ` ` ` Patient sent for surgical consultation at the request of Dr Loletha [facility]  Chief Complaint: Mid Rectal Cancer ` ` Patient returns today with her husband. She completed chemotherapy neoadjuvant radiation. Oral Xeloda. 10/25/2017 last dose.  She is scheduled for surgery on 12/21/2017  Comes today with her husband. Her bowels are normalizing. Appetite is okay. Her activity level is somewhat limited with her knee pain but she is walking moving around relatively well.  Prior visit: The patient is a pleasant woman that had a positive Cologuard test. Underwent colonoscopy through [facility] gastroenterology. Unfortunately, found to have a bulky mass in the mid rectum about 7 cm from the anal verge. Biopsy consistent with adenocarcinoma. Labs ordered. Tests ordered. Surgical consultation requested. She comes today with her daughter who works in the Physicist, medical. Her husband and granddaughter here later for the visit as well. Patient had C-sections and appendectomy but no other surgeries. She usually would move her bowels most days. More recently became more frequent. Since the colonoscopy, she's been a little constipated. Normally can walk at least a half hour running errands and [employer]. However with her worsening knee joint pain, she does not walk as far. However she does not have any history of stroke or nor cardiopulmonary issues. She is not diabetic. She does not smoke. She is morbidly obese but otherwise active. She does have some anxiety issues and is worried about being in a closed MRI for the pelvis. CT  chest/abdomen/pelvis shows no evidence of metastatic disease. CEA 0.5. No prior family history of any digestive tract colorectal cancer that she is aware of  No personal nor family history of GI/colon cancer, inflammatory bowel disease, irritable bowel syndrome, allergy such as Celiac Sprue, dietary/dairy problems, colitis, ulcers nor gastritis. No recent sick contacts/gastroenteritis. No travel outside the country. No changes in diet. No dysphagia to solids or liquids. No significant heartburn or reflux. No hematochezia, hematemesis, coffee ground emesis. No evidence of prior gastric/peptic ulceration.  (Review of systems as stated in this history (HPI) or in the review of systems. Otherwise all other 12 point ROS are negative) ` ` `   Problem List/Past Medical Samantha Hector, MD; 11/29/2017 12:35 PM) RECTAL ADENOCARCINOMA (C20) PREOP COLON - ENCOUNTER FOR PREOPERATIVE EXAMINATION FOR GENERAL SURGICAL PROCEDURE (E52.778)  Past Surgical History Samantha Hector, MD; 11/29/2017 12:35 PM) Appendectomy Cesarean Section - Multiple Colon Polyp Removal - Colonoscopy  Diagnostic Studies History Samantha Hector, MD; 11/29/2017 12:35 PM) Colonoscopy within last year Mammogram within last year Pap Smear >5 years ago  Allergies Malachi Bonds, CMA; 11/29/2017 12:21 PM) No Known Drug Allergies [08/21/2017]:  Medication History Malachi Bonds, CMA; 11/29/2017 12:21 PM) Levothyroxine Sodium (50MCG Tablet, Oral) Active. Multivitamin & Mineral (Oral) Active. Medications Reconciled  Social History Samantha Hector, MD; 11/29/2017 12:35 PM) Caffeine use Carbonated beverages, Coffee, Tea. No alcohol use No drug use Tobacco use Never smoker.  Family History Samantha Hector, MD; 11/29/2017 12:35 PM) Colon Polyps Father. Diabetes Mellitus Mother. Kidney Disease Sister. Respiratory Condition Mother.  Pregnancy / Birth History Samantha Hector, MD; 11/29/2017  12:35 PM) Age at menarche 73 years. Age of menopause 16-50 Contraceptive  History Oral contraceptives. Gravida 2 Maternal age 59-25 Para 2  Other Problems Samantha Hector, MD; 11/29/2017 12:35 PM) Arthritis Colon Cancer Thyroid Disease     Review of Systems Samantha Hector, MD; 11/29/2017 12:36 PM) General Not Present- Appetite Loss, Chills, Fatigue, Fever, Night Sweats, Weight Gain and Weight Loss. Skin Present- Dryness. Not Present- Change in Wart/Mole, Hives, Jaundice, New Lesions, Non-Healing Wounds, Rash and Ulcer. HEENT Present- Ringing in the Ears. Not Present- Earache, Hearing Loss, Hoarseness, Nose Bleed, Oral Ulcers, Seasonal Allergies, Sinus Pain, Sore Throat, Visual Disturbances, Wears glasses/contact lenses and Yellow Eyes. Respiratory Not Present- Bloody sputum, Chronic Cough, Difficulty Breathing, Snoring and Wheezing. Breast Not Present- Breast Mass, Breast Pain, Nipple Discharge and Skin Changes. Cardiovascular Not Present- Chest Pain, Difficulty Breathing Lying Down, Leg Cramps, Palpitations, Rapid Heart Rate, Shortness of Breath and Swelling of Extremities. Gastrointestinal Present- Abdominal Pain, Bloody Stool and Change in Bowel Habits. Not Present- Bloating, Chronic diarrhea, Constipation, Difficulty Swallowing, Excessive gas, Gets full quickly at meals, Hemorrhoids, Indigestion, Nausea, Rectal Pain and Vomiting. Female Genitourinary Not Present- Frequency, Nocturia, Painful Urination, Pelvic Pain and Urgency. Musculoskeletal Present- Joint Pain. Not Present- Back Pain, Joint Stiffness, Muscle Pain, Muscle Weakness and Swelling of Extremities. Neurological Not Present- Decreased Memory, Fainting, Headaches, Numbness, Seizures, Tingling, Tremor, Trouble walking and Weakness. Psychiatric Not Present- Anxiety, Bipolar, Change in Sleep Pattern, Depression, Fearful and Frequent crying. Endocrine Not Present- Cold Intolerance, Excessive Hunger, Hair Changes, Heat  Intolerance, Hot flashes and New Diabetes. Hematology Not Present- Blood Thinners, Easy Bruising, Excessive bleeding, Gland problems, HIV and Persistent Infections.  Vitals (Chemira Jones CMA; 11/29/2017 12:21 PM) 11/29/2017 12:21 PM Weight: 241 lb Height: 63in Body Surface Area: 2.09 m Body Mass Index: 42.69 kg/m  Temp.: 99.70F(Oral)  Pulse: 104 (Regular)  BP: 160/80 (Sitting, Left Arm, Standard)      Physical Exam Samantha Hector MD; 11/29/2017 12:42 PM)  General Mental Status-Alert. General Appearance-Not in acute distress, Not Sickly. Orientation-Oriented X3. Hydration-Well hydrated. Voice-Normal. Note: Mildly anxious but consolable. Not cachectic. Not sickly. Moves around easily/normally  Integumentary Global Assessment Normal Exam - Axillae: non-tender, no inflammation or ulceration, no drainage. and Distribution of scalp and body hair is normal. General Characteristics Temperature - normal warmth is noted.  Head and Neck Head-normocephalic, atraumatic with no lesions or palpable masses. Face Global Assessment - atraumatic, no absence of expression. Neck Global Assessment - no abnormal movements, no bruit auscultated on the right, no bruit auscultated on the left, no decreased range of motion, non-tender. Trachea-midline. Thyroid Gland Characteristics - non-tender.  Eye Eyeball - Left-Extraocular movements intact, No Nystagmus. Eyeball - Right-Extraocular movements intact, No Nystagmus. Cornea - Left-No Hazy. Cornea - Right-No Hazy. Sclera/Conjunctiva - Left-No scleral icterus, No Discharge. Sclera/Conjunctiva - Right-No scleral icterus, No Discharge. Pupil - Left-Direct reaction to light normal. Pupil - Right-Direct reaction to light normal.  ENMT Ears Pinna - Left - no drainage observed, no generalized tenderness observed. Right - no drainage observed, no generalized tenderness observed. Nose and  Sinuses Nose - no destructive lesion observed. Nares - Left - quiet respiration. Right - quiet respiration. Mouth and Throat Lips - Upper Lip - no fissures observed, no pallor noted. Lower Lip - no fissures observed, no pallor noted. Nasopharynx - no discharge present. Oral Cavity/Oropharynx - Tongue - no dryness observed. Oral Mucosa - no cyanosis observed. Hypopharynx - no evidence of airway distress observed.  Chest and Lung Exam Inspection Movements - Normal and Symmetrical. Accessory muscles - No use of accessory  muscles in breathing. Palpation Normal exam - Non-tender. Auscultation Breath sounds - Normal and Clear.  Cardiovascular Auscultation Rhythm - Regular. Murmurs & Other Heart Sounds - Normal exam - No Murmurs and No Systolic Clicks.  Abdomen Inspection Normal Exam - No Visible peristalsis and No Abnormal pulsations. Umbilicus - No Bleeding, No Urine drainage. Palpation/Percussion Normal exam - Soft, Non Tender, No Rebound tenderness, No Rigidity (guarding) and No Cutaneous hyperesthesia. Note: Abdomen obese but soft. Not distended. No distasis recti. No umbilical or other anterior abdominal wall hernias  Female Genitourinary Sexual Maturity Tanner 5 - Adult hair pattern. Note: No vaginal bleeding nor discharge. Minimal panniculus with good hygiene. No inguinal hernias. No lymphadenopathy.  Rectal Note: Mid rectal mass felt about 8 cm from the sphincters primarily involving the left anterolateral sidewall. Smaller. More like 20% of the circumference this time. Remains mobile. Much less tender.  Peripheral Vascular Upper Extremity Inspection - Left - No Cyanotic nailbeds, Not Ischemic. Right - No Cyanotic nailbeds, Not Ischemic.  Neurologic Neurologic evaluation reveals -normal attention span and ability to concentrate, able to name objects and repeat phrases. Appropriate fund of knowledge , normal sensation and normal coordination. Mental Status Affect -  not angry, not paranoid. Cranial Nerves-Normal Bilaterally. Gait-Normal.  Neuropsychiatric Mental status exam performed with findings of-able to articulate well with normal speech/language, rate, volume and coherence, thought content normal with ability to perform basic computations and apply abstract reasoning and no evidence of hallucinations, delusions, obsessions or homicidal/suicidal ideation.  Musculoskeletal Global Assessment Spine, Ribs and Pelvis - no instability, subluxation or laxity. Right Upper Extremity - no instability, subluxation or laxity.  Lymphatic Head & Neck General Head & Neck Lymphatics: Bilateral - Description - No Localized lymphadenopathy. Axillary General Axillary Region: Bilateral - Description - No Localized lymphadenopathy. Femoral & Inguinal Generalized Femoral & Inguinal Lymphatics: Left: Right - Description - No Localized lymphadenopathy. Description - No Localized lymphadenopathy.    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 Highly suspicious for at least a T3 lesion. No evidence of distant metastatic disease by CT of chest/abdomen/pelvis. CEA 0.5  Recovering status post neoadjuvant chemoradiation therapy for T3 mass.  Oral Xeloda and radiation together over 5.5 week course.  Then a low anterior resection 10 weeks later. Robotically-assisted approach. I suspect given her obesity and the tumor being palpable by my finger, most likely the anastomosis will be within 5 cm and require a protective loop ileostomy temporarily for sphincter sparing surgery. She is not a smoker and is female, but she is obese. Usually colorectal EEA anastomosis ends up lower than your think in morbidly obese folks. We will see.  Again went over the surgery in detail. We will get her involved in the enhance recovery pathway. Preoperative education visit.  She is scheduled  for surgery on 12/21/2017  The anatomy & physiology of the digestive tract was discussed.  The pathophysiology of the colon was discussed.  Natural history risks without surgery was discussed.   I feel the risks of no intervention will lead to serious problems that outweigh the operative risks; therefore, I recommended a partial colectomy to remove the pathology.  Minimally invasive (Robotic/Laparoscopic) & open techniques were discussed.   Risks such as bleeding, infection, abscess, leak, reoperation, injury to other organs, need for repair of tissues / organs, possible ostomy, hernia, heart attack, stroke, death, and other risks were discussed.  I noted a good likelihood this  will help address the problem.   Goals of post-operative recovery were discussed as well.   Need for adequate nutrition, daily bowel regimen and healthy physical activity, to optimize recovery was noted as well. We will work to minimize complications.  Educational materials were available as well.  Questions were answered.  The patient expresses understanding & wishes to proceed with surgery.   Current Plans Pt Education - CCS Ostomy HCI (Rillie Riffel): discussed with patient and provided information. You are being scheduled for surgery- Our schedulers will call you.  You should hear from our office's scheduling department within 5 working days about the location, date, and time of surgery. We try to make accommodations for patient's preferences in scheduling surgery, but sometimes the OR schedule or the surgeon's schedule prevents [place] from making those accommodations.  If you have not heard from our office [phone]) in 5 working days, call the office and ask for your surgeon's nurse.  If you have other questions about your diagnosis, plan, or surgery, call the office and ask for your surgeon's nurse.   PREOP COLON - ENCOUNTER FOR PREOPERATIVE EXAMINATION FOR GENERAL SURGICAL PROCEDURE (E39.532)  Current Plans Written  instructions provided Pt Education - CCS Colon Bowel Prep 2018 ERAS/Miralax/Antibiotics Restarted Neomycin Sulfate 500 MG Oral Tablet, 2 (two) Tablet SEE NOTE, #6, 11/29/2017, No Refill. Local Order: TAKE TWO TABLETS AT 2 PM, 3 PM, AND 10 PM THE DAY PRIOR TO SURGERY Restarted Flagyl 500 MG Oral Tablet, 2 (two) Tablet SEE NOTE, #6, 11/29/2017, No Refill. Local Order: Take at 2pm, 3pm, and 10pm the day prior to your colon operation Pt Education - CCS Colectomy post-op instructions: discussed with patient and provided information.  Samantha Hector, MD, FACS, MASCRS Gastrointestinal and Minimally Invasive Surgery    [address] [place], [place] [address] [phone] Main / Paging [phone] Fax

## 2017-12-12 NOTE — Patient Instructions (Addendum)
Samantha Lutz  12/12/2017   Your procedure is scheduled on: 12-21-17   Report to [facility] Main  Entrance    Report to admitting at 5:30AM     Call this number if you have problems the morning of surgery [phone]     Remember: [place]!  [place] 2 PRESURGERY ENSURE [place] AT 10:00 PM.  NOTHING BY MOUTH EXCEPT CLEAR LIQUIDS. THE MORNING OF SURGERY PLEASE DRINK 1 PRESURGERY DRINK. PLEASE FINISH PRESURGERY ENSURE DRINK PER SURGEON ORDER 3 HOURS PRIOR TO SCHEDULED SURGERY TIME WHICH NEEDS TO BE COMPLETED AT ____4:30AM_____.  NOTHING BY MOUTH AFTER THE LAST PRE-SURGERY DRINK!  BRUSH YOUR TEETH MORNING OF SURGERY AND RINSE YOUR MOUTH OUT, NO CHEWING GUM CANDY OR MINTS.      CLEAR LIQUID DIET   Foods Allowed                                                                     Foods Excluded  Coffee and tea, regular and decaf                             liquids that you cannot  Plain Jell-O in any flavor                                             see through such as: Fruit ices (not with fruit pulp)                                     milk, soups, orange juice  Iced Popsicles                                    All solid food Carbonated beverages, regular and diet                                    Cranberry, grape and apple juices Sports drinks like Gatorade Lightly seasoned clear broth or consume(fat free) Sugar, honey syrup  Sample Menu Breakfast                                Lunch                                     Supper Cranberry juice                    Beef broth                            Chicken broth  Jell-O                                     Grape juice                           Apple juice Coffee or tea                        Jell-O                                      Popsicle                                                Coffee or tea                         Coffee or tea  _____________________________________________________________________       Take these medicines the morning of surgery with A SIP OF WATER: LEVOTHYROXINE(SYNTHROID)                                You may not have any metal on your body including hair pins and              piercings  Do not wear jewelry, make-up, lotions, powders or perfumes, deodorant             Do not wear nail polish.  Do not shave  48 hours prior to surgery.           Do not bring valuables to the hospital. [place].  Contacts, dentures or bridgework may not be worn into surgery.  Leave suitcase in the car. After surgery it may be brought to your room.               Please read over the following fact sheets you were given: _____________________________________________________________________             [facility] - Preparing for Surgery Before surgery, you can play an important role.  Because skin is not sterile, your skin needs to be as free of germs as possible.  You can reduce the number of germs on your skin by washing with CHG (chlorahexidine gluconate) soap before surgery.  CHG is an antiseptic cleaner which kills germs and bonds with the skin to continue killing germs even after washing. Please DO NOT use if you have an allergy to CHG or antibacterial soaps.  If your skin becomes reddened/irritated stop using the CHG and inform your nurse when you arrive at Short Stay. Do not shave (including legs and underarms) for at least 48 hours prior to the first CHG shower.  You may shave your face/neck. Please follow these instructions carefully:  1.  Shower with CHG Soap the night before surgery and the  morning of Surgery.  2.  If you choose to wash your hair, wash your hair first as usual with your  normal  shampoo.  3.  After you shampoo, rinse your hair and body thoroughly to remove the  shampoo.                           4.  Use CHG as  you would any other liquid soap.  You can apply chg directly  to the skin and wash                       Gently with a scrungie or clean washcloth.  5.  Apply the CHG Soap to your body ONLY FROM THE NECK DOWN.   Do not use on face/ open                           Wound or open sores. Avoid contact with eyes, ears mouth and genitals (private parts).                       Wash face,  Genitals (private parts) with your normal soap.             6.  Wash thoroughly, paying special attention to the area where your surgery  will be performed.  7.  Thoroughly rinse your body with warm water from the neck down.  8.  DO NOT shower/wash with your normal soap after using and rinsing off  the CHG Soap.                9.  Pat yourself dry with a clean towel.            10.  Wear clean pajamas.            11.  Place clean sheets on your bed the night of your first shower and do not  sleep with pets. Day of Surgery : Do not apply any lotions/deodorants the morning of surgery.  Please wear clean clothes to the hospital/surgery center.  FAILURE TO FOLLOW THESE INSTRUCTIONS MAY RESULT IN THE CANCELLATION OF YOUR SURGERY PATIENT SIGNATURE_________________________________  NURSE SIGNATURE__________________________________  ________________________________________________________________________   [name]  An incentive spirometer is a tool that can help keep your lungs clear and active. This tool measures how well you are filling your lungs with each breath. Taking long deep breaths may help reverse or decrease the chance of developing breathing (pulmonary) problems (especially infection) following:  A long period of time when you are unable to move or be active. BEFORE THE PROCEDURE   If the spirometer includes an indicator to show your best effort, your nurse or respiratory therapist will set it to a desired goal.  If possible, sit up straight or lean slightly forward. Try not to  slouch.  Hold the incentive spirometer in an upright position. INSTRUCTIONS FOR USE  1. Sit on the edge of your bed if possible, or sit up as far as you can in bed or on a chair. 2. Hold the incentive spirometer in an upright position. 3. Breathe out normally. 4. Place the mouthpiece in your mouth and seal your lips tightly around it. 5. Breathe in slowly and as deeply as possible, raising the piston or the ball toward the top of the column. 6. Hold your breath for 3-5 seconds or for as long as possible. Allow the piston or ball to fall to the bottom of the column. 7. Remove the mouthpiece from your mouth and breathe out  normally. 8. Rest for a few seconds and repeat Steps 1 through 7 at least 10 times every 1-2 hours when you are awake. Take your time and take a few normal breaths between deep breaths. 9. The spirometer may include an indicator to show your best effort. Use the indicator as a goal to work toward during each repetition. 10. After each set of 10 deep breaths, practice coughing to be sure your lungs are clear. If you have an incision (the cut made at the time of surgery), support your incision when coughing by placing a pillow or rolled up towels firmly against it. Once you are able to get out of bed, walk around indoors and cough well. You may stop using the incentive spirometer when instructed by your caregiver.  RISKS AND COMPLICATIONS  Take your time so you do not get dizzy or light-headed.  If you are in pain, you may need to take or ask for pain medication before doing incentive spirometry. It is harder to take a deep breath if you are having pain. AFTER USE  Rest and breathe slowly and easily.  It can be helpful to keep track of a log of your progress. Your caregiver can provide you with a simple table to help with this. If you are using the spirometer at home, follow these instructions: [place] IF:   You are having difficultly using the spirometer.  You  have trouble using the spirometer as often as instructed.  Your pain medication is not giving enough relief while using the spirometer.  You develop fever of 100.5 F (38.1 C) or higher. SEEK IMMEDIATE MEDICAL CARE IF:   You cough up bloody sputum that had not been present before.  You develop fever of 102 F (38.9 C) or greater.  You develop worsening pain at or near the incision site. MAKE SURE YOU:   Understand these instructions.  Will watch your condition.  Will get help right away if you are not doing well or get worse. Document Released: 05/29/2006 Document Revised: 04/10/2011 Document Reviewed: 07/30/2006 [facility] Patient Information 2014 [place], [place].   ________________________________________________________________________

## 2017-12-12 NOTE — Progress Notes (Signed)
EKG 04-25-17 epic   CT Chest 08-17-17 epic

## 2017-12-13 ENCOUNTER — Encounter (HOSPITAL_COMMUNITY): Payer: Self-pay | Admitting: Emergency Medicine

## 2017-12-13 ENCOUNTER — Emergency Department (HOSPITAL_COMMUNITY): Payer: Medicare Other

## 2017-12-13 ENCOUNTER — Encounter (HOSPITAL_COMMUNITY)
Admission: RE | Admit: 2017-12-13 | Discharge: 2017-12-13 | Disposition: A | Discharge status: Home / Self Care | Payer: Medicare Other | Source: Ambulatory Visit | Attending: Surgery | Admitting: Surgery

## 2017-12-13 ENCOUNTER — Other Ambulatory Visit: Payer: Self-pay

## 2017-12-13 ENCOUNTER — Emergency Department (HOSPITAL_COMMUNITY)
Admission: EM | Admit: 2017-12-13 | Discharge: 2017-12-13 | Disposition: A | Discharge status: Home / Self Care | Payer: Medicare Other | Attending: Emergency Medicine | Admitting: Emergency Medicine

## 2017-12-13 ENCOUNTER — Encounter (HOSPITAL_COMMUNITY): Payer: Self-pay

## 2017-12-13 DIAGNOSIS — C20 Malignant neoplasm of rectum: Secondary | ICD-10-CM

## 2017-12-13 DIAGNOSIS — I10 Essential (primary) hypertension: Secondary | ICD-10-CM | POA: Insufficient documentation

## 2017-12-13 DIAGNOSIS — Z79899 Other long term (current) drug therapy: Secondary | ICD-10-CM | POA: Insufficient documentation

## 2017-12-13 DIAGNOSIS — R079 Chest pain, unspecified: Secondary | ICD-10-CM | POA: Insufficient documentation

## 2017-12-13 DIAGNOSIS — E039 Hypothyroidism, unspecified: Secondary | ICD-10-CM | POA: Insufficient documentation

## 2017-12-13 DIAGNOSIS — Z85040 Personal history of malignant carcinoid tumor of rectum: Secondary | ICD-10-CM | POA: Insufficient documentation

## 2017-12-13 DIAGNOSIS — Z01818 Encounter for other preprocedural examination: Secondary | ICD-10-CM

## 2017-12-13 HISTORY — DX: Thrombocytopenia, unspecified: D69.6

## 2017-12-13 HISTORY — DX: Cardiac arrhythmia, unspecified: I49.9

## 2017-12-13 LAB — ABO/RH: ABO/RH(D): A NEG

## 2017-12-13 LAB — HEMOGLOBIN A1C
Hgb A1c MFr Bld: 4.9 % (ref 4.8–5.6)
Mean Plasma Glucose: 93.93 mg/dL

## 2017-12-13 LAB — HEPATIC FUNCTION PANEL
ALT: 33 U/L (ref 0–44)
AST: 43 U/L — ABNORMAL HIGH (ref 15–41)
Albumin: 3.8 g/dL (ref 3.5–5.0)
Alkaline Phosphatase: 51 U/L (ref 38–126)
Bilirubin, Direct: 0.2 mg/dL (ref 0.0–0.2)
Indirect Bilirubin: 0.7 mg/dL (ref 0.3–0.9)
Total Bilirubin: 0.9 mg/dL (ref 0.3–1.2)
Total Protein: 7.4 g/dL (ref 6.5–8.1)

## 2017-12-13 LAB — CBC
HCT: 36.2 % (ref 36.0–46.0)
HCT: 38 % (ref 36.0–46.0)
Hemoglobin: 12.2 g/dL (ref 12.0–15.0)
Hemoglobin: 12.8 g/dL (ref 12.0–15.0)
MCH: 33 pg (ref 26.0–34.0)
MCH: 32.5 pg (ref 26.0–34.0)
MCHC: 33.7 g/dL (ref 30.0–36.0)
MCHC: 33.7 g/dL (ref 30.0–36.0)
MCV: 97.8 fL (ref 80.0–100.0)
MCV: 96.4 fL (ref 80.0–100.0)
Platelets: 143 10*3/uL — ABNORMAL LOW (ref 150–400)
Platelets: 169 10*3/uL (ref 150–400)
RBC: 3.7 MIL/uL — ABNORMAL LOW (ref 3.87–5.11)
RBC: 3.94 MIL/uL (ref 3.87–5.11)
RDW: 14.4 % (ref 11.5–15.5)
RDW: 14.3 % (ref 11.5–15.5)
WBC: 5.7 10*3/uL (ref 4.0–10.5)
WBC: 6.3 10*3/uL (ref 4.0–10.5)
nRBC: 0 % (ref 0.0–0.2)
nRBC: 0 % (ref 0.0–0.2)

## 2017-12-13 LAB — BASIC METABOLIC PANEL
Anion gap: 9 (ref 5–15)
BUN: 14 mg/dL (ref 8–23)
CO2: 24 mmol/L (ref 22–32)
Calcium: 9.3 mg/dL (ref 8.9–10.3)
Chloride: 106 mmol/L (ref 98–111)
Creatinine, Ser: 0.86 mg/dL (ref 0.44–1.00)
GFR calc Af Amer: >60 mL/min (ref >60)
GFR calc non Af Amer: >60 mL/min (ref >60)
Glucose, Bld: 129 mg/dL — ABNORMAL HIGH (ref 70–99)
Potassium: 3.8 mmol/L (ref 3.5–5.1)
Sodium: 139 mmol/L (ref 135–145)

## 2017-12-13 LAB — COMPREHENSIVE METABOLIC PANEL
ALT: 31 U/L (ref 0–44)
AST: 41 U/L (ref 15–41)
Albumin: 3.7 g/dL (ref 3.5–5.0)
Alkaline Phosphatase: 49 U/L (ref 38–126)
Anion gap: 9 (ref 5–15)
BUN: 13 mg/dL (ref 8–23)
CO2: 26 mmol/L (ref 22–32)
Calcium: 9.1 mg/dL (ref 8.9–10.3)
Chloride: 106 mmol/L (ref 98–111)
Creatinine, Ser: 0.9 mg/dL (ref 0.44–1.00)
GFR calc Af Amer: >60 mL/min (ref >60)
GFR calc non Af Amer: >60 mL/min (ref >60)
Glucose, Bld: 152 mg/dL — ABNORMAL HIGH (ref 70–99)
Potassium: 4.2 mmol/L (ref 3.5–5.1)
Sodium: 141 mmol/L (ref 135–145)
Total Bilirubin: 0.7 mg/dL (ref 0.3–1.2)
Total Protein: 7.1 g/dL (ref 6.5–8.1)

## 2017-12-13 LAB — I-STAT TROPONIN, ED
Troponin i, poc: 0.01 ng/mL (ref 0.00–0.08)
Troponin i, poc: 0.01 ng/mL (ref 0.00–0.08)

## 2017-12-13 LAB — LIPASE, BLOOD: Lipase: 32 U/L (ref 11–51)

## 2017-12-13 LAB — D-DIMER, QUANTITATIVE: D-Dimer, Quant: 0.4 ug/mL-FEU (ref 0.00–0.50)

## 2017-12-13 NOTE — Progress Notes (Signed)
CBC 12-13-17 at 11:11am, pre-op progress note ,and ED visit note routed via epic to Dr [name]

## 2017-12-13 NOTE — ED Triage Notes (Addendum)
Pt was at Pre-Op and c/o left sided chest pains that started this am. Pt had EKG done at Pre-op that was normal but sent to ED for further evaluation. Reports at this time pains have stopped. Nothing made pains worse or better nor radiated anywhere.

## 2017-12-13 NOTE — Discharge Instructions (Signed)
Your work-up today was overall reassuring.  We did not find evidence of a cardiac or pulmonary cause of your symptoms.  I suspect it was a combination of muscular skeletal discomfort and anxiety.  Since you are feeling better and your work-up is reassuring, we feel you are safe for discharge home.  Please follow-up with your oncologist, your PCP, and continue your outpatient cancer management.  If any symptoms change or worsen, please return to the nearest emergency department.

## 2017-12-13 NOTE — ED Provider Notes (Signed)
[facility] DEPT Provider Note   CSN: [id number] Arrival date & time: 12/13/17  1058     History   Chief Complaint Chief Complaint  Patient presents with  . Chest Pain    HPI Samantha Lutz is a 67 y.o. female.  The history is provided by the patient and medical records. No language interpreter was used.  Chest Pain   This is a new problem. The current episode started less than 1 hour ago. The problem occurs constantly. The problem has not changed since onset.The pain is present in the substernal region. The pain is at a severity of 5/10. The pain is moderate. The quality of the pain is described as sharp. The pain does not radiate. Duration of episode(s) is 1 hour. Pertinent negatives include no abdominal pain, no back pain, no cough, no diaphoresis, no dizziness, no exertional chest pressure, no fever, no headaches, no hemoptysis, no irregular heartbeat, no leg pain, no lower extremity edema, no malaise/fatigue, no nausea, no near-syncope, no palpitations, no shortness of breath, no sputum production and no weakness. She has tried nothing for the symptoms. The treatment provided no relief.  Her past medical history is significant for cancer.    Past Medical History:  Diagnosis Date  . Anemia   . Arthritis   . Fatty liver    ultrasound December 2012  . Hypertension    diet controlled  . Hypothyroid 01/16/2013 dx  . Irregular heartbeat    PER PATIENT REPORT; ONSET 20+YEARS AGO SAW CARDIOLOGY AT THE TIME C/O "ITS BEAT REALLY FAST FOR A MINUTE AND THEN STOPPED " DENIES ANY OTHER CARDIAC SX';  REPORTS CARDIO DID ECHO WHICH WAS NEGATIVE;  NOW COMES AND GOES ;   . Lichen sclerosus    steroids as needed  . Temporary low platelet count ([place])    SEE LAST LABS IN EPIC    Patient Active Problem List   Diagnosis Date Noted  . 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 Encounter for general adult medical examination with abnormal findings 04/25/2017   . Hypothyroid 01/16/2013  . Right knee pain   . Elevated LFTs 01/03/2011  . Hypertension 12/19/2010  . Obese     Past Surgical History:  Procedure Laterality Date  . APPENDECTOMY  1967  . [place]     OB History   None      Home Medications    Prior to Admission medications   Medication Sig Start Date End Date Taking? Authorizing Provider  capecitabine (XELODA) 500 MG tablet Take 4 tablets (2000mg ) by mouth in AM & 3 tabs (1500mg ) in PM, within 36min of finishing food. Take on radiation days only, M-F Patient not taking: Reported on 11/12/2017 09/04/17   Ladell Pier, MD  clobetasol ointment (TEMOVATE) 6.28 % Apply 1 application topically daily as needed (irritation).    [provider]  levothyroxine (SYNTHROID, LEVOTHROID) 50 MCG tablet TAKE 1 TABLET BY MOUTH ONCE DAILY Patient taking differently: Take 25 mcg by mouth daily before breakfast.  11/07/17   Lance Sell, NP  metroNIDAZOLE (FLAGYL) 500 MG tablet Take 1,000 mg by mouth 3 (three) times daily.  08/21/17   [provider]  neomycin (MYCIFRADIN) 500 MG tablet Take 1,000 mg by mouth 3 (three) times daily.  08/22/17   [provider]  prochlorperazine (COMPAZINE) 10 MG tablet Take 1 tablet (10 mg total) by mouth every 6 (six) hours as needed for nausea or vomiting.  Patient not taking: Reported on 10/12/2017 09/04/17   Ladell Pier, MD    Family History Family History  Problem Relation Age of Onset  . Diabetes Mother   . Hypertension Mother   . COPD Mother   . Glaucoma Father 65  . Stroke Maternal Grandmother 67  . Hypertension Maternal Grandmother   . Rheum arthritis Sister 26  . Hypertension Daughter   . Breast cancer Cousin   . Colon cancer Neg Hx   . Esophageal cancer Neg Hx   . Liver cancer Neg Hx   . Pancreatic cancer Neg Hx   . Stomach cancer Neg Hx   . Rectal cancer Neg Hx     Social History Social History   Tobacco Use  . Smoking status:  Never Smoker  . Smokeless tobacco: Never Used  Substance Use Topics  . Alcohol use: No  . Drug use: No     Allergies   Epinephrine and Losartan   Review of Systems Review of Systems  Constitutional: Negative for diaphoresis, fever and malaise/fatigue.  HENT: Negative for congestion.   Respiratory: Negative for cough, hemoptysis, sputum production, chest tightness, shortness of breath, wheezing and stridor.   Cardiovascular: Positive for chest pain. Negative for palpitations, leg swelling and near-syncope.  Gastrointestinal: Negative for abdominal pain, constipation, diarrhea and nausea.  Genitourinary: Negative for flank pain.  Musculoskeletal: Negative for back pain, myalgias and neck pain.  Neurological: Negative for dizziness, weakness, light-headedness and headaches.  Psychiatric/Behavioral: Negative for agitation.  All other systems reviewed and are negative.    Physical Exam Updated Vital Signs BP (!) 207/113 (BP Location: Right Arm) Comment: RN aware  Pulse 92   Temp 98.3 F (36.8 C) (Oral)   Resp 18   Ht 5\' 3"  (1.6 m)   Wt 108.4 kg   SpO2 97%   BMI 42.34 kg/m   Physical Exam  Constitutional: She is oriented to person, place, and time. She appears well-developed and well-nourished.  Non-toxic appearance. She does not appear ill. No distress.  HENT:  Head: Normocephalic and atraumatic.  Eyes: Pupils are equal, round, and reactive to light. Conjunctivae are normal.  Neck: Normal range of motion. Neck supple.  Cardiovascular: Normal rate, regular rhythm and normal pulses.  No murmur heard. Pulmonary/Chest: Effort normal and breath sounds normal. No stridor. No respiratory distress. She has no decreased breath sounds. She has no wheezes. She has no rhonchi. She has no rales. She exhibits no tenderness.  Abdominal: Soft. There is no tenderness.  Musculoskeletal: She exhibits no edema.       Right lower leg: She exhibits no tenderness and no edema.       Left  lower leg: She exhibits no tenderness and no edema.  Neurological: She is alert and oriented to person, place, and time. No sensory deficit. She exhibits normal muscle tone.  Skin: Skin is warm and dry. Capillary refill takes less than 2 seconds. No rash noted. No erythema.  Psychiatric: She has a normal mood and affect.  Nursing note and vitals reviewed.    ED Treatments / Results  Labs (all labs ordered are listed, but only abnormal results are displayed) Labs Reviewed  BASIC METABOLIC PANEL - Abnormal; Notable for the following components:      Result Value   Glucose, Bld 129 (*)    All other components within normal limits  HEPATIC FUNCTION PANEL - Abnormal; Notable for the following components:   AST 43 (*)    All other components  within normal limits  CBC  D-DIMER, QUANTITATIVE (NOT AT [facility])  LIPASE, BLOOD  I-STAT TROPONIN, ED  I-STAT TROPONIN, ED    EKG EKG Interpretation  Date/Time:  Thursday December 13 2017 11:59:36 EST Ventricular Rate:  82 PR Interval:    QRS Duration: 68 QT Interval:  391 QTC Calculation: 457 R Axis:   43 Text Interpretation:  Sinus rhythm Low voltage, precordial leads Baseline wander in lead(s) V5 No ECG in system before today.  No STEMI Confirmed by Antony Blackbird [phone]) on 12/13/2017 1:39:26 PM   Radiology Dg Chest 2 View  Result Date: 12/13/2017 CLINICAL DATA:  Acute LEFT chest pain today. EXAM: CHEST - 2 VIEW COMPARISON:  08/17/2017 chest CT FINDINGS: The cardiomediastinal silhouette is unremarkable. Mild chronic peribronchial thickening again noted. There is no evidence of focal airspace disease, pulmonary edema, suspicious pulmonary nodule/mass, pleural effusion, or pneumothorax. No acute bony abnormalities are identified. IMPRESSION: No active cardiopulmonary disease. Electronically Signed   By: Margarette Canada M.D.   On: 12/13/2017 11:34    Procedures Procedures (including critical care time)  Medications Ordered in ED Medications  - No data to display   Initial Impression / Assessment and Plan / ED Course  I have reviewed the triage vital signs and the nursing notes.  Pertinent labs & imaging results that were available during my care of the patient were reviewed by me and considered in my medical decision making (see chart for details).     ROANN MERK is a 67 y.o. female with a past medical history significant for hypertension, hypothyroidism, and rectal cancer who was currently in a preoperative visit to have surgery in the next few weeks who presents for chest pain.  Patient reports that she has been feeling anxious during these visits.  She reports that she had onset of sharp pain in her central chest that does not radiate.  No associated shortness of breath, lightheadedness, palpitations, nausea vomiting, diaphoresis, radiation of pain, syncope, or other complaints.  She denies any trauma.  She denies any history of DVT or PE.  No recent leg pain or leg swelling.  No recent long car trips or plane flights.  No recent fevers, chills, congestion, or cough.  She reports the pain started at 9 AM fairly suddenly.  She reports it was up to an 8 out of 10 at onset and is now a 4 out of 10 in severity.  She denies other complaints.  On exam, lungs clear.  Chest is nontender.  Abdomen is nontender.  Back is nontender.  CVA areas nontender.  Legs have no significant edema or tenderness.  Patient resting comfortably on room air.  Given patient's hypercoagulable state with rectal cancer, patient will have a d-dimer to look for PE.  Have a lower suspicion for PE given her vital signs however the sudden onset of sharp chest pain this is considered.  Patient will have other labs and chest x-ray and EKG and troponin to further evaluate.  Initial blood pressure was elevated in triage however it has improved to the 604 systolic on my evaluation.  Patient has symmetric pulses in all extremities.  Low suspicion for dissection at this  time.  Patient will have a delta troponin.  EKG shows no STEMI and was reassuring.    Heart score calculated as a 3.  Anticipate reassessment after work-up.  3:28 PM Delta troponin is negative.  BNP not elevated.  Chest x-ray shows no significant cardia bony abnormality and labs were  otherwise reassuring.    Given her low heart score and reassuring troponins, we feel patient is safe for discharge home.  Patient has no further pain after it resolved during her ED work-up.  Suspect combination of muscular skeletal pain and anxiety contribute to her symptoms.  Patient understands return precautions and plans to follow-up with her PCP and continue her outpatient management of her cancer.  Patient had no other worsens or concerns and was discharged in good condition with resolution of symptoms and reassuring work-up.    Final Clinical Impressions(s) / ED Diagnoses   Final diagnoses:  Nonspecific chest pain    ED Discharge Orders    None      No follow-up provider specified.    Clinical Impression: 1. Nonspecific chest pain     Disposition: Discharge  Condition: Good  I have discussed the results, Dx and Tx plan with the pt(& family if present). He/she/they expressed understanding and agree(s) with the plan. Discharge instructions discussed at great length. Strict return precautions discussed and pt &/or family have verbalized understanding of the instructions. No further questions at time of discharge.    New Prescriptions   No medications on file    Follow Up: Lance Sell, NP [place] [id number] [phone]     [facility]-EMERGENCY DEPT [place] [id number] [place]       , Gwenyth Allegra, MD 12/13/17 1540

## 2017-12-13 NOTE — Progress Notes (Signed)
0959 BP 166/83, HR 95 C/O left sided chest pain, describes as a continuous "twinge"; onset this morning before she left the house to come to pre-op appt, had a bagle for breakfast; does report that she feels very anxious about upcoming surgery but has never experienced pain like this before;  denies radiation; does not worsen with deep inspiration; denies headache or change in vision; nor SOB;  reports hx of palpitations but not present now with chest pain. Patient sent for EKG to evaluate rhythm.   1010  EKG shows NSR   1015 RN called and spoke with anesthesia Dr Annye Asa for consult. Per MD, as patient is having active chest pain , would recommend patient be seen in ED for further evaluation.   82 RN relayed anesthesia recommendation to patient. Patient expressed concern of possibly being admitted or having an IV started if she went to ED but patient agreeable to being evaluated in ED. RN called and gave report to [facility] ED charge nurse Bruce. RN escorted patient and her spouse to ED. ED triage staff took over and  placed patient and her spouse in triage in room 5.

## 2017-12-13 NOTE — ED Notes (Signed)
3:31 PM Pt given food for PO  Challenge at this time.

## 2017-12-13 NOTE — Consult Note (Addendum)
[facility] Nurse requested for preoperative stoma site marking  Discussed surgical procedure and stoma creation with patient.  Explained role of the [facility] nurse team.  Provided the patient with educational booklet and provided samples of pouching options.  Answered patient's questions.   Examined patient sitting, and standing in order to place the marking in the patient's visual field, away from any creases or abdominal contour issues and within the rectus muscle.  Attempted to mark below the patient's belt line, but this was not possible since a significant crease occurs when patient is sitting which should be avoided if possible.   Marked for colostomy in the LUQ  _10___ cm to the left of the umbilicus and __5__HG above the umbilicus.  Marked for ileostomy in the RUQ  __10__cm to the right of the umbilicus and  ___9_ cm above the umbilicus.  Patient's abdomen cleansed with CHG wipes at site markings, allowed to air dry prior to marking. Covered mark with thin film transparent dressing to preserve mark until date of surgery and pen provided to the patient and instructed to re-color in the marks if the film falls off before surgery.  [facility] Nurse team will follow up with patient after surgery for continue ostomy care and teaching after surgery if an ostomy is created. Julien Girt MSN, RN, [place], [place], [place]

## 2017-12-20 MED ORDER — SODIUM CHLORIDE 0.9 % IV SOLN
INTRAVENOUS | Status: DC
  Filled 2017-12-20: qty 6

## 2017-12-20 MED ORDER — BUPIVACAINE LIPOSOME 1.3 % IJ SUSP
20.0000 mL | INTRAMUSCULAR | Status: DC
  Filled 2017-12-20: qty 20

## 2017-12-20 NOTE — Anesthesia Preprocedure Evaluation (Addendum)
Anesthesia Evaluation  Patient identified by MRN, date of birth, ID band Patient awake    Reviewed: Allergy & Precautions, NPO status , Patient's Chart, lab work & pertinent test results  History of Anesthesia Complications Negative for: history of anesthetic complications  Airway Mallampati: II  TM Distance: >3 FB Neck ROM: Full    Dental no notable dental hx. (+) Teeth Intact, Dental Advisory Given   Pulmonary neg pulmonary ROS,    Pulmonary exam normal breath sounds clear to auscultation       Cardiovascular hypertension, Normal cardiovascular exam Rhythm:Regular Rate:Normal     Neuro/Psych negative neurological ROS     GI/Hepatic Neg liver ROS, Rectal ca   Endo/Other  Hypothyroidism Morbid obesity  Renal/GU negative Renal ROS     Musculoskeletal  (+) Arthritis , Osteoarthritis,    Abdominal (+) + obese,   Peds  Hematology negative hematology ROS (+)   Anesthesia Other Findings Day of surgery medications reviewed with the patient.  Reproductive/Obstetrics                            Anesthesia Physical Anesthesia Plan  ASA: III  Anesthesia Plan: General   Post-op Pain Management:    Induction: Intravenous, Rapid sequence and Cricoid pressure planned  PONV Risk Score and Plan: 3 and Treatment may vary due to age or medical condition, Ondansetron, Dexamethasone and Midazolam  Airway Management Planned: Oral ETT and Video Laryngoscope Planned  Additional Equipment:   Intra-op Plan:   Post-operative Plan: Extubation in OR  Informed Consent: I have reviewed the patients History and Physical, chart, labs and discussed the procedure including the risks, benefits and alternatives for the proposed anesthesia with the patient or authorized representative who has indicated his/her understanding and acceptance.   Dental advisory given  Plan Discussed with: CRNA  Anesthesia Plan  Comments: (RSI due to N/V this morning)       Anesthesia Quick Evaluation

## 2017-12-21 ENCOUNTER — Inpatient Hospital Stay (HOSPITAL_COMMUNITY): Payer: Medicare Other

## 2017-12-21 ENCOUNTER — Other Ambulatory Visit: Payer: Self-pay

## 2017-12-21 ENCOUNTER — Encounter (HOSPITAL_COMMUNITY)
Admission: RE | Disposition: A | Discharge status: Home Health | Payer: Self-pay | Source: Ambulatory Visit | Attending: Surgery

## 2017-12-21 ENCOUNTER — Encounter (HOSPITAL_COMMUNITY): Payer: Self-pay

## 2017-12-21 ENCOUNTER — Inpatient Hospital Stay (HOSPITAL_COMMUNITY): Payer: Medicare Other | Admitting: Certified Registered Nurse Anesthetist

## 2017-12-21 ENCOUNTER — Inpatient Hospital Stay (HOSPITAL_COMMUNITY)
Admission: RE | Admit: 2017-12-21 | Discharge: 2017-12-25 | DRG: 330 | Disposition: A | Discharge status: Home Health | Payer: Medicare Other | Source: Ambulatory Visit | Attending: Surgery | Admitting: Surgery

## 2017-12-21 DIAGNOSIS — M25561 Pain in right knee: Secondary | ICD-10-CM | POA: Diagnosis present

## 2017-12-21 DIAGNOSIS — E039 Hypothyroidism, unspecified: Secondary | ICD-10-CM | POA: Diagnosis present

## 2017-12-21 DIAGNOSIS — Z888 Allergy status to other drugs, medicaments and biological substances status: Secondary | ICD-10-CM | POA: Diagnosis not present

## 2017-12-21 DIAGNOSIS — Z79899 Other long term (current) drug therapy: Secondary | ICD-10-CM

## 2017-12-21 DIAGNOSIS — K660 Peritoneal adhesions (postprocedural) (postinfection): Secondary | ICD-10-CM | POA: Diagnosis present

## 2017-12-21 DIAGNOSIS — I10 Essential (primary) hypertension: Secondary | ICD-10-CM | POA: Diagnosis present

## 2017-12-21 DIAGNOSIS — K5939 Other megacolon: Secondary | ICD-10-CM | POA: Diagnosis present

## 2017-12-21 DIAGNOSIS — C20 Malignant neoplasm of rectum: Secondary | ICD-10-CM

## 2017-12-21 DIAGNOSIS — C19 Malignant neoplasm of rectosigmoid junction: Secondary | ICD-10-CM | POA: Diagnosis present

## 2017-12-21 DIAGNOSIS — Z9221 Personal history of antineoplastic chemotherapy: Secondary | ICD-10-CM | POA: Diagnosis not present

## 2017-12-21 DIAGNOSIS — K5900 Constipation, unspecified: Secondary | ICD-10-CM | POA: Diagnosis present

## 2017-12-21 DIAGNOSIS — Z932 Ileostomy status: Secondary | ICD-10-CM

## 2017-12-21 DIAGNOSIS — Z6841 Body Mass Index (BMI) 40.0 and over, adult: Secondary | ICD-10-CM | POA: Diagnosis not present

## 2017-12-21 DIAGNOSIS — Z9889 Other specified postprocedural states: Secondary | ICD-10-CM

## 2017-12-21 DIAGNOSIS — Z419 Encounter for procedure for purposes other than remedying health state, unspecified: Secondary | ICD-10-CM

## 2017-12-21 HISTORY — PX: OSTOMY: SHX5997

## 2017-12-21 HISTORY — PX: PROCTOSCOPY: SHX2266

## 2017-12-21 HISTORY — PX: XI ROBOTIC ASSISTED LOWER ANTERIOR RESECTION: SHX6558

## 2017-12-21 LAB — TYPE AND SCREEN
ABO/RH(D): A NEG
Antibody Screen: NEGATIVE

## 2017-12-21 SURGERY — RESECTION, RECTUM, LOW ANTERIOR, ROBOT-ASSISTED
Anesthesia: General

## 2017-12-21 MED ORDER — ONDANSETRON HCL 4 MG/2ML IJ SOLN: 4.0000 mg | Freq: Four times a day (QID) | INTRAMUSCULAR | Status: DC | PRN

## 2017-12-21 MED ORDER — OXYCODONE HCL 5 MG PO TABS: 5.0000 mg | ORAL_TABLET | Freq: Once | ORAL | Status: DC | PRN

## 2017-12-21 MED ORDER — ACETAMINOPHEN 500 MG PO TABS
1000.0000 mg | ORAL_TABLET | Freq: Four times a day (QID) | ORAL | Status: DC
  Administered 2017-12-21 – 2017-12-25 (×13): 1000 mg via ORAL
  Filled 2017-12-21 (×14): qty 2

## 2017-12-21 MED ORDER — ALVIMOPAN 12 MG PO CAPS
12.0000 mg | ORAL_CAPSULE | Freq: Two times a day (BID) | ORAL | Status: DC
  Administered 2017-12-22: 12 mg via ORAL
  Filled 2017-12-21: qty 1

## 2017-12-21 MED ORDER — MAGIC MOUTHWASH
15.0000 mL | Freq: Four times a day (QID) | ORAL | Status: DC | PRN
  Filled 2017-12-21: qty 15

## 2017-12-21 MED ORDER — SODIUM CHLORIDE 0.9 % IV SOLN: 1000.0000 mL | Freq: Three times a day (TID) | INTRAVENOUS | Status: AC | PRN

## 2017-12-21 MED ORDER — LACTATED RINGERS IR SOLN
Status: DC | PRN
  Administered 2017-12-21: 1

## 2017-12-21 MED ORDER — HYDRALAZINE HCL 20 MG/ML IJ SOLN: 10.0000 mg | INTRAMUSCULAR | Status: DC | PRN

## 2017-12-21 MED ORDER — ACETAMINOPHEN 500 MG PO TABS
1000.0000 mg | ORAL_TABLET | ORAL | Status: AC
  Administered 2017-12-21: 1000 mg via ORAL
  Filled 2017-12-21: qty 2

## 2017-12-21 MED ORDER — SODIUM CHLORIDE 0.9 % IV SOLN
2.0000 g | INTRAVENOUS | Status: AC
  Administered 2017-12-21: 2 g via INTRAVENOUS
  Filled 2017-12-21: qty 2

## 2017-12-21 MED ORDER — BUPIVACAINE HCL (PF) 0.25 % IJ SOLN
INTRAMUSCULAR | Status: AC
  Filled 2017-12-21: qty 30

## 2017-12-21 MED ORDER — ENOXAPARIN SODIUM 40 MG/0.4ML ~~LOC~~ SOLN
40.0000 mg | Freq: Once | SUBCUTANEOUS | Status: AC
  Administered 2017-12-21: 40 mg via SUBCUTANEOUS
  Filled 2017-12-21: qty 0.4

## 2017-12-21 MED ORDER — FENTANYL CITRATE (PF) 100 MCG/2ML IJ SOLN
INTRAMUSCULAR | Status: AC
  Filled 2017-12-21: qty 2

## 2017-12-21 MED ORDER — PROCHLORPERAZINE MALEATE 10 MG PO TABS: 10.0000 mg | ORAL_TABLET | Freq: Four times a day (QID) | ORAL | Status: DC | PRN

## 2017-12-21 MED ORDER — METRONIDAZOLE 500 MG PO TABS
1000.0000 mg | ORAL_TABLET | ORAL | Status: DC
  Filled 2017-12-21: qty 2

## 2017-12-21 MED ORDER — KETAMINE HCL 10 MG/ML IJ SOLN
INTRAMUSCULAR | Status: DC | PRN
  Administered 2017-12-21: 10 mg via INTRAVENOUS
  Administered 2017-12-21: 30 mg via INTRAVENOUS
  Administered 2017-12-21: 10 mg via INTRAVENOUS

## 2017-12-21 MED ORDER — LEVOTHYROXINE SODIUM 25 MCG PO TABS
25.0000 ug | ORAL_TABLET | Freq: Every day | ORAL | Status: DC
  Administered 2017-12-22 – 2017-12-25 (×4): 25 ug via ORAL
  Filled 2017-12-21 (×4): qty 1

## 2017-12-21 MED ORDER — HYDROMORPHONE HCL 1 MG/ML IJ SOLN
0.2500 mg | INTRAMUSCULAR | Status: DC | PRN
  Administered 2017-12-21 (×2): 0.5 mg via INTRAVENOUS

## 2017-12-21 MED ORDER — PROPOFOL 10 MG/ML IV BOLUS
INTRAVENOUS | Status: AC
  Filled 2017-12-21: qty 20

## 2017-12-21 MED ORDER — BSS IO SOLN
15.0000 mL | Freq: Once | INTRAOCULAR | Status: DC
  Filled 2017-12-21: qty 15

## 2017-12-21 MED ORDER — PHENYLEPHRINE HCL 10 MG/ML IJ SOLN
INTRAMUSCULAR | Status: AC
  Filled 2017-12-21: qty 1

## 2017-12-21 MED ORDER — CHLORHEXIDINE GLUCONATE CLOTH 2 % EX PADS: 6.0000 | MEDICATED_PAD | Freq: Once | CUTANEOUS | Status: DC

## 2017-12-21 MED ORDER — TRAMADOL HCL 50 MG PO TABS
50.0000 mg | ORAL_TABLET | Freq: Four times a day (QID) | ORAL | Status: DC | PRN
  Administered 2017-12-22 (×3): 50 mg via ORAL
  Administered 2017-12-23 (×2): 100 mg via ORAL
  Administered 2017-12-24: 50 mg via ORAL
  Filled 2017-12-21: qty 1
  Filled 2017-12-21 (×2): qty 2
  Filled 2017-12-21 (×2): qty 1
  Filled 2017-12-21 (×2): qty 2
  Filled 2017-12-21: qty 1

## 2017-12-21 MED ORDER — NEOMYCIN SULFATE 500 MG PO TABS
1000.0000 mg | ORAL_TABLET | ORAL | Status: DC
  Filled 2017-12-21: qty 2

## 2017-12-21 MED ORDER — POLYETHYLENE GLYCOL 3350 17 GM/SCOOP PO POWD: 1.0000 | Freq: Once | ORAL | Status: DC

## 2017-12-21 MED ORDER — FENTANYL CITRATE (PF) 100 MCG/2ML IJ SOLN
INTRAMUSCULAR | Status: DC | PRN
  Administered 2017-12-21 (×4): 50 ug via INTRAVENOUS
  Administered 2017-12-21: 100 ug via INTRAVENOUS
  Administered 2017-12-21 (×3): 50 ug via INTRAVENOUS

## 2017-12-21 MED ORDER — DEXAMETHASONE SODIUM PHOSPHATE 10 MG/ML IJ SOLN
INTRAMUSCULAR | Status: DC | PRN
  Administered 2017-12-21: 10 mg via INTRAVENOUS

## 2017-12-21 MED ORDER — BISACODYL 5 MG PO TBEC
20.0000 mg | DELAYED_RELEASE_TABLET | Freq: Once | ORAL | Status: DC
  Filled 2017-12-21: qty 4

## 2017-12-21 MED ORDER — SODIUM CHLORIDE 0.9 % IV SOLN
2.0000 g | Freq: Two times a day (BID) | INTRAVENOUS | Status: AC
  Administered 2017-12-21: 2 g via INTRAVENOUS
  Filled 2017-12-21: qty 2

## 2017-12-21 MED ORDER — LIDOCAINE 2% (20 MG/ML) 5 ML SYRINGE
INTRAMUSCULAR | Status: AC
  Filled 2017-12-21: qty 5

## 2017-12-21 MED ORDER — KETOROLAC TROMETHAMINE 0.5 % OP SOLN
1.0000 [drp] | Freq: Three times a day (TID) | OPHTHALMIC | Status: AC | PRN
  Filled 2017-12-21: qty 5

## 2017-12-21 MED ORDER — PROMETHAZINE HCL 25 MG/ML IJ SOLN: 6.2500 mg | INTRAMUSCULAR | Status: DC | PRN

## 2017-12-21 MED ORDER — OXYCODONE HCL 5 MG/5ML PO SOLN: 5.0000 mg | Freq: Once | ORAL | Status: DC | PRN

## 2017-12-21 MED ORDER — ENOXAPARIN SODIUM 40 MG/0.4ML ~~LOC~~ SOLN
40.0000 mg | SUBCUTANEOUS | Status: DC
  Administered 2017-12-22 – 2017-12-25 (×4): 40 mg via SUBCUTANEOUS
  Filled 2017-12-21 (×4): qty 0.4

## 2017-12-21 MED ORDER — SODIUM CHLORIDE (PF) 0.9 % IJ SOLN
INTRAMUSCULAR | Status: DC | PRN
  Administered 2017-12-21: 50 mL

## 2017-12-21 MED ORDER — ONDANSETRON HCL 4 MG/2ML IJ SOLN
INTRAMUSCULAR | Status: DC | PRN
  Administered 2017-12-21: 4 mg via INTRAVENOUS

## 2017-12-21 MED ORDER — GABAPENTIN 300 MG PO CAPS
300.0000 mg | ORAL_CAPSULE | ORAL | Status: AC
  Administered 2017-12-21: 300 mg via ORAL
  Filled 2017-12-21: qty 1

## 2017-12-21 MED ORDER — KETAMINE HCL 10 MG/ML IJ SOLN
INTRAMUSCULAR | Status: AC
  Filled 2017-12-21: qty 1

## 2017-12-21 MED ORDER — SUGAMMADEX SODIUM 500 MG/5ML IV SOLN
INTRAVENOUS | Status: DC | PRN
  Administered 2017-12-21: 250 mg via INTRAVENOUS

## 2017-12-21 MED ORDER — METOCLOPRAMIDE HCL 5 MG/ML IJ SOLN: 10.0000 mg | Freq: Four times a day (QID) | INTRAMUSCULAR | Status: DC | PRN

## 2017-12-21 MED ORDER — LIDOCAINE 2% (20 MG/ML) 5 ML SYRINGE
INTRAMUSCULAR | Status: DC | PRN
  Administered 2017-12-21: 100 mg via INTRAVENOUS

## 2017-12-21 MED ORDER — HYDROMORPHONE HCL 1 MG/ML IJ SOLN
0.5000 mg | INTRAMUSCULAR | Status: DC | PRN
  Administered 2017-12-21 – 2017-12-22 (×2): 1 mg via INTRAVENOUS
  Filled 2017-12-21 (×2): qty 1

## 2017-12-21 MED ORDER — LIP MEDEX EX OINT
1.0000 "application " | TOPICAL_OINTMENT | Freq: Two times a day (BID) | CUTANEOUS | Status: DC
  Administered 2017-12-21 – 2017-12-24 (×6): 1 via TOPICAL
  Filled 2017-12-21: qty 7

## 2017-12-21 MED ORDER — BUPIVACAINE LIPOSOME 1.3 % IJ SUSP
INTRAMUSCULAR | Status: DC | PRN
  Administered 2017-12-21: 20 mL

## 2017-12-21 MED ORDER — SODIUM CHLORIDE (PF) 0.9 % IJ SOLN
INTRAMUSCULAR | Status: AC
  Filled 2017-12-21: qty 10

## 2017-12-21 MED ORDER — ACETAMINOPHEN 10 MG/ML IV SOLN: 1000.0000 mg | Freq: Once | INTRAVENOUS | Status: DC | PRN

## 2017-12-21 MED ORDER — LACTATED RINGERS IV SOLN
INTRAVENOUS | Status: DC | PRN
  Administered 2017-12-21: 08:00:00 via INTRAVENOUS

## 2017-12-21 MED ORDER — DEXAMETHASONE SODIUM PHOSPHATE 10 MG/ML IJ SOLN
INTRAMUSCULAR | Status: AC
  Filled 2017-12-21: qty 1

## 2017-12-21 MED ORDER — PROPOFOL 10 MG/ML IV BOLUS
INTRAVENOUS | Status: DC | PRN
  Administered 2017-12-21: 200 mg via INTRAVENOUS

## 2017-12-21 MED ORDER — LACTATED RINGERS IV SOLN
INTRAVENOUS | Status: DC
  Administered 2017-12-21 (×2): via INTRAVENOUS
  Administered 2017-12-21: 1000 mL via INTRAVENOUS

## 2017-12-21 MED ORDER — ONDANSETRON HCL 4 MG/2ML IJ SOLN
INTRAMUSCULAR | Status: AC
  Filled 2017-12-21: qty 2

## 2017-12-21 MED ORDER — SUCCINYLCHOLINE CHLORIDE 200 MG/10ML IV SOSY
PREFILLED_SYRINGE | INTRAVENOUS | Status: AC
  Filled 2017-12-21: qty 10

## 2017-12-21 MED ORDER — 0.9 % SODIUM CHLORIDE (POUR BTL) OPTIME
TOPICAL | Status: DC | PRN
  Administered 2017-12-21: 2000 mL

## 2017-12-21 MED ORDER — DIPHENHYDRAMINE HCL 12.5 MG/5ML PO ELIX: 12.5000 mg | ORAL_SOLUTION | Freq: Four times a day (QID) | ORAL | Status: DC | PRN

## 2017-12-21 MED ORDER — PROCHLORPERAZINE EDISYLATE 10 MG/2ML IJ SOLN: 5.0000 mg | Freq: Four times a day (QID) | INTRAMUSCULAR | Status: DC | PRN

## 2017-12-21 MED ORDER — FENTANYL CITRATE (PF) 250 MCG/5ML IJ SOLN
INTRAMUSCULAR | Status: AC
  Filled 2017-12-21: qty 5

## 2017-12-21 MED ORDER — VITAMIN C 500 MG PO TABS
500.0000 mg | ORAL_TABLET | Freq: Two times a day (BID) | ORAL | Status: DC
  Administered 2017-12-21 – 2017-12-24 (×7): 500 mg via ORAL
  Filled 2017-12-21 (×7): qty 1

## 2017-12-21 MED ORDER — ROCURONIUM BROMIDE 50 MG/5ML IV SOSY
PREFILLED_SYRINGE | INTRAVENOUS | Status: DC | PRN
  Administered 2017-12-21: 10 mg via INTRAVENOUS
  Administered 2017-12-21: 20 mg via INTRAVENOUS
  Administered 2017-12-21: 60 mg via INTRAVENOUS
  Administered 2017-12-21: 10 mg via INTRAVENOUS

## 2017-12-21 MED ORDER — INDOCYANINE GREEN 25 MG IV SOLR
INTRAVENOUS | Status: DC | PRN
  Administered 2017-12-21: 7.5 mg via INTRAVENOUS

## 2017-12-21 MED ORDER — ALVIMOPAN 12 MG PO CAPS
12.0000 mg | ORAL_CAPSULE | ORAL | Status: AC
  Administered 2017-12-21: 12 mg via ORAL
  Filled 2017-12-21: qty 1

## 2017-12-21 MED ORDER — PSYLLIUM 95 % PO PACK
1.0000 | PACK | Freq: Two times a day (BID) | ORAL | Status: DC
  Administered 2017-12-21 – 2017-12-24 (×7): 1 via ORAL
  Filled 2017-12-21 (×7): qty 1

## 2017-12-21 MED ORDER — DIPHENHYDRAMINE HCL 50 MG/ML IJ SOLN: 12.5000 mg | Freq: Four times a day (QID) | INTRAMUSCULAR | Status: DC | PRN

## 2017-12-21 MED ORDER — LACTATED RINGERS IV SOLN: INTRAVENOUS | Status: DC

## 2017-12-21 MED ORDER — SUGAMMADEX SODIUM 500 MG/5ML IV SOLN
INTRAVENOUS | Status: AC
  Filled 2017-12-21: qty 5

## 2017-12-21 MED ORDER — SODIUM CHLORIDE 0.9 % IV SOLN
INTRAVENOUS | Status: DC | PRN
  Administered 2017-12-21: 20 ug/min via INTRAVENOUS

## 2017-12-21 MED ORDER — ONDANSETRON HCL 4 MG PO TABS
4.0000 mg | ORAL_TABLET | Freq: Four times a day (QID) | ORAL | Status: DC | PRN
  Administered 2017-12-23 (×2): 4 mg via ORAL
  Filled 2017-12-21 (×2): qty 1

## 2017-12-21 MED ORDER — ALUM & MAG HYDROXIDE-SIMETH 200-200-20 MG/5ML PO SUSP: 30.0000 mL | Freq: Four times a day (QID) | ORAL | Status: DC | PRN

## 2017-12-21 MED ORDER — EPHEDRINE SULFATE-NACL 50-0.9 MG/10ML-% IV SOSY
PREFILLED_SYRINGE | INTRAVENOUS | Status: DC | PRN
  Administered 2017-12-21: 5 mg via INTRAVENOUS
  Administered 2017-12-21 (×2): 10 mg via INTRAVENOUS

## 2017-12-21 MED ORDER — METOPROLOL TARTRATE 5 MG/5ML IV SOLN
5.0000 mg | Freq: Four times a day (QID) | INTRAVENOUS | Status: DC | PRN
  Administered 2017-12-22: 5 mg via INTRAVENOUS
  Filled 2017-12-21: qty 5

## 2017-12-21 MED ORDER — PHENYLEPHRINE 40 MCG/ML (10ML) SYRINGE FOR IV PUSH (FOR BLOOD PRESSURE SUPPORT)
PREFILLED_SYRINGE | INTRAVENOUS | Status: DC | PRN
  Administered 2017-12-21 (×3): 80 ug via INTRAVENOUS

## 2017-12-21 MED ORDER — SACCHAROMYCES BOULARDII 250 MG PO CAPS
250.0000 mg | ORAL_CAPSULE | Freq: Two times a day (BID) | ORAL | Status: DC
  Administered 2017-12-21 – 2017-12-24 (×7): 250 mg via ORAL
  Filled 2017-12-21 (×7): qty 1

## 2017-12-21 MED ORDER — SUCCINYLCHOLINE CHLORIDE 200 MG/10ML IV SOSY
PREFILLED_SYRINGE | INTRAVENOUS | Status: DC | PRN
  Administered 2017-12-21: 160 mg via INTRAVENOUS

## 2017-12-21 MED ORDER — GABAPENTIN 300 MG PO CAPS
300.0000 mg | ORAL_CAPSULE | Freq: Two times a day (BID) | ORAL | Status: DC
  Administered 2017-12-21 – 2017-12-24 (×7): 300 mg via ORAL
  Filled 2017-12-21 (×7): qty 1

## 2017-12-21 MED ORDER — POLYMYXIN B-TRIMETHOPRIM 10000-0.1 UNIT/ML-% OP SOLN
1.0000 [drp] | Freq: Three times a day (TID) | OPHTHALMIC | Status: AC
  Administered 2017-12-21 – 2017-12-22 (×3): 1 [drp] via OPHTHALMIC
  Filled 2017-12-21: qty 10

## 2017-12-21 MED ORDER — SODIUM CHLORIDE (PF) 0.9 % IJ SOLN
INTRAMUSCULAR | Status: AC
  Filled 2017-12-21: qty 50

## 2017-12-21 MED ORDER — MIDAZOLAM HCL 2 MG/2ML IJ SOLN
INTRAMUSCULAR | Status: AC
  Filled 2017-12-21: qty 2

## 2017-12-21 MED ORDER — SODIUM CHLORIDE 0.9 % IV SOLN
INTRAVENOUS | Status: DC | PRN
  Administered 2017-12-21: 13:00:00 via INTRAPERITONEAL

## 2017-12-21 MED ORDER — HYDROMORPHONE HCL 1 MG/ML IJ SOLN
INTRAMUSCULAR | Status: AC
  Administered 2017-12-21: 0.5 mg via INTRAVENOUS
  Filled 2017-12-21: qty 1

## 2017-12-21 MED ORDER — LACTATED RINGERS IV SOLN
INTRAVENOUS | Status: AC
  Administered 2017-12-22: 07:00:00 via INTRAVENOUS

## 2017-12-21 MED ORDER — MIDAZOLAM HCL 5 MG/5ML IJ SOLN
INTRAMUSCULAR | Status: DC | PRN
  Administered 2017-12-21: 2 mg via INTRAVENOUS

## 2017-12-21 MED ORDER — ENSURE SURGERY PO LIQD
237.0000 mL | Freq: Two times a day (BID) | ORAL | Status: DC
  Administered 2017-12-22 – 2017-12-24 (×6): 237 mL via ORAL
  Filled 2017-12-21 (×9): qty 237

## 2017-12-21 MED ORDER — BUPIVACAINE HCL (PF) 0.25 % IJ SOLN
INTRAMUSCULAR | Status: DC | PRN
  Administered 2017-12-21: 60 mL

## 2017-12-21 SURGICAL SUPPLY — 105 items
APPLIER CLIP 5 13 M/L LIGAMAX5 (MISCELLANEOUS)
APPLIER CLIP ROT 10 11.4 M/L (STAPLE)
APR CLP MED LRG 11.4X10 (STAPLE)
APR CLP MED LRG 5 ANG JAW (MISCELLANEOUS)
BLADE EXTENDED COATED 6.5IN (ELECTRODE) ×2 IMPLANT
CANNULA REDUC XI 12-8 STAPL (CANNULA) ×1
CANNULA REDUCER 12-8 DVNC XI (CANNULA) ×1 IMPLANT
CATH ROBINSON RED A/P 16FR (CATHETERS) ×1 IMPLANT
CELLS DAT CNTRL 66122 CELL SVR (MISCELLANEOUS) IMPLANT
CHLORAPREP W/TINT 26ML (MISCELLANEOUS) ×2 IMPLANT
CLIP APPLIE 5 13 M/L LIGAMAX5 (MISCELLANEOUS) IMPLANT
CLIP APPLIE ROT 10 11.4 M/L (STAPLE) IMPLANT
CLIP VESOLOCK LG 6/CT PURPLE (CLIP) IMPLANT
CLIP VESOLOCK MED LG 6/CT (CLIP) IMPLANT
COVER SURGICAL LIGHT HANDLE (MISCELLANEOUS) ×4 IMPLANT
COVER TIP SHEARS 8 DVNC (MISCELLANEOUS) ×1 IMPLANT
COVER TIP SHEARS 8MM DA VINCI (MISCELLANEOUS) ×1
COVER WAND RF STERILE (DRAPES) ×1 IMPLANT
DECANTER SPIKE VIAL GLASS SM (MISCELLANEOUS) ×2 IMPLANT
DEVICE TROCAR PUNCTURE CLOSURE (ENDOMECHANICALS) IMPLANT
DRAIN CHANNEL 19F RND (DRAIN) ×2 IMPLANT
DRAPE ARM DVNC X/XI (DISPOSABLE) ×4 IMPLANT
DRAPE COLUMN DVNC XI (DISPOSABLE) ×1 IMPLANT
DRAPE DA VINCI XI ARM (DISPOSABLE) ×4
DRAPE DA VINCI XI COLUMN (DISPOSABLE) ×1
DRAPE SURG IRRIG POUCH 19X23 (DRAPES) ×2 IMPLANT
DRSG OPSITE POSTOP 4X10 (GAUZE/BANDAGES/DRESSINGS) IMPLANT
DRSG OPSITE POSTOP 4X6 (GAUZE/BANDAGES/DRESSINGS) IMPLANT
DRSG OPSITE POSTOP 4X8 (GAUZE/BANDAGES/DRESSINGS) IMPLANT
DRSG TEGADERM 2-3/8X2-3/4 SM (GAUZE/BANDAGES/DRESSINGS) ×10 IMPLANT
DRSG TEGADERM 4X4.75 (GAUZE/BANDAGES/DRESSINGS) ×2 IMPLANT
ELECT PENCIL ROCKER SW 15FT (MISCELLANEOUS) ×2 IMPLANT
ELECT REM PT RETURN 15FT ADLT (MISCELLANEOUS) ×2 IMPLANT
ENDOLOOP SUT PDS II  0 18 (SUTURE)
ENDOLOOP SUT PDS II 0 18 (SUTURE) IMPLANT
EVACUATOR SILICONE 100CC (DRAIN) ×2 IMPLANT
GAUZE SPONGE 2X2 8PLY STRL LF (GAUZE/BANDAGES/DRESSINGS) ×1 IMPLANT
GAUZE SPONGE 4X4 12PLY STRL (GAUZE/BANDAGES/DRESSINGS) IMPLANT
GLOVE ECLIPSE 8.0 STRL XLNG CF (GLOVE) ×10 IMPLANT
GLOVE INDICATOR 8.0 STRL GRN (GLOVE) ×10 IMPLANT
GOWN STRL REUS W/TWL XL LVL3 (GOWN DISPOSABLE) ×10 IMPLANT
GRASPER SUT TROCAR 14GX15 (MISCELLANEOUS) ×2 IMPLANT
HOLDER FOLEY CATH W/STRAP (MISCELLANEOUS) ×2 IMPLANT
IRRIG SUCT STRYKERFLOW 2 WTIP (MISCELLANEOUS)
IRRIGATION SUCT STRKRFLW 2 WTP (MISCELLANEOUS) IMPLANT
IRRIGATOR SUCT 8 DISP DVNC XI (IRRIGATION / IRRIGATOR) IMPLANT
IRRIGATOR SUCTION 8MM XI DISP (IRRIGATION / IRRIGATOR)
KIT PROCEDURE DA VINCI SI (MISCELLANEOUS) ×1
KIT PROCEDURE DVNC SI (MISCELLANEOUS) ×1 IMPLANT
NDL INSUFFLATION 14GA 120MM (NEEDLE) ×1 IMPLANT
NEEDLE INSUFFLATION 14GA 120MM (NEEDLE) ×2 IMPLANT
PACK CARDIOVASCULAR III (CUSTOM PROCEDURE TRAY) ×2 IMPLANT
PACK COLON (CUSTOM PROCEDURE TRAY) ×2 IMPLANT
PAD POSITIONING PINK XL (MISCELLANEOUS) ×2 IMPLANT
PORT LAP GEL ALEXIS MED 5-9CM (MISCELLANEOUS) ×2 IMPLANT
PROTECTOR NERVE ULNAR (MISCELLANEOUS) ×4 IMPLANT
RELOAD STAPLE 45 GRN THCK DVNC (STAPLE) IMPLANT
RETRACTOR WND ALEXIS 18 MED (MISCELLANEOUS) IMPLANT
RTRCTR WOUND ALEXIS 18CM MED (MISCELLANEOUS)
SCISSORS LAP 5X35 DISP (ENDOMECHANICALS) ×2 IMPLANT
SEAL CANN UNIV 5-8 DVNC XI (MISCELLANEOUS) ×4 IMPLANT
SEAL XI 5MM-8MM UNIVERSAL (MISCELLANEOUS) ×4
SEALER VESSEL DA VINCI XI (MISCELLANEOUS) ×1
SEALER VESSEL EXT DVNC XI (MISCELLANEOUS) ×1 IMPLANT
SLEEVE ADV FIXATION 5X100MM (TROCAR) IMPLANT
SOLUTION ELECTROLUBE (MISCELLANEOUS) ×2 IMPLANT
SPONGE GAUZE 2X2 STER 10/PKG (GAUZE/BANDAGES/DRESSINGS) ×1
STAPLER 45 BLU RELOAD XI (STAPLE) IMPLANT
STAPLER 45 BLUE RELOAD XI (STAPLE)
STAPLER 45 GREEN RELOAD XI (STAPLE) ×3
STAPLER 45 GRN RELOAD XI (STAPLE) ×3 IMPLANT
STAPLER CANNULA SEAL DVNC XI (STAPLE) ×1 IMPLANT
STAPLER CANNULA SEAL XI (STAPLE) ×1
STAPLER CIRC ILS CVD 33MM 37CM (STAPLE) ×1 IMPLANT
STAPLER SHEATH (SHEATH) ×1
STAPLER SHEATH ENDOWRIST DVNC (SHEATH) ×1 IMPLANT
SURGILUBE 2OZ TUBE FLIPTOP (MISCELLANEOUS) ×2 IMPLANT
SUT MNCRL AB 4-0 PS2 18 (SUTURE) ×3 IMPLANT
SUT PDS AB 1 CTX 36 (SUTURE) IMPLANT
SUT PDS AB 1 TP1 96 (SUTURE) IMPLANT
SUT PROLENE 0 CT 2 (SUTURE) IMPLANT
SUT PROLENE 2 0 BLUE (SUTURE) ×1 IMPLANT
SUT PROLENE 2 0 KS (SUTURE) IMPLANT
SUT PROLENE 2 0 SH DA (SUTURE) IMPLANT
SUT SILK 2 0 (SUTURE)
SUT SILK 2 0 SH CR/8 (SUTURE) IMPLANT
SUT SILK 2-0 18XBRD TIE 12 (SUTURE) IMPLANT
SUT SILK 3 0 (SUTURE) ×2
SUT SILK 3 0 SH CR/8 (SUTURE) ×2 IMPLANT
SUT SILK 3-0 18XBRD TIE 12 (SUTURE) ×1 IMPLANT
SUT V-LOC BARB 180 2/0GR6 GS22 (SUTURE)
SUT VIC AB 3-0 SH 18 (SUTURE) ×2 IMPLANT
SUT VIC AB 3-0 SH 27 (SUTURE)
SUT VIC AB 3-0 SH 27XBRD (SUTURE) IMPLANT
SUT VICRYL 0 UR6 27IN ABS (SUTURE) ×2 IMPLANT
SUTURE V-LC BRB 180 2/0GR6GS22 (SUTURE) IMPLANT
SYR 10ML LL (SYRINGE) ×2 IMPLANT
SYS LAPSCP GELPORT 120MM (MISCELLANEOUS)
SYSTEM LAPSCP GELPORT 120MM (MISCELLANEOUS) IMPLANT
TAPE UMBILICAL COTTON 1/8X30 (MISCELLANEOUS) ×2 IMPLANT
TOWEL OR NON WOVEN STRL DISP B (DISPOSABLE) ×2 IMPLANT
TRAY FOLEY MTR SLVR 16FR STAT (SET/KITS/TRAYS/PACK) ×2 IMPLANT
TROCAR ADV FIXATION 5X100MM (TROCAR) ×2 IMPLANT
TUBING CONNECTING 10 (TUBING) ×4 IMPLANT
TUBING INSUFFLATION 10FT LAP (TUBING) ×2 IMPLANT

## 2017-12-21 NOTE — Interval H&P Note (Signed)
History and Physical Interval Note:  12/21/2017 7:15 AM  Samantha Lutz  has presented today for surgery, with the diagnosis of Rectal cancer  The various methods of treatment have been discussed with the patient and family. After consideration of risks, benefits and other options for treatment, the patient has consented to  Procedure(s): XI ROBOTIC ASSISTED LOWER ANTERIOR RESECTION (N/A) POSSIBLE OSTOMY (N/A) RIGID PROCTOSCOPY (N/A) as a surgical intervention .  The patient's history has been reviewed, patient examined, no change in status, stable for surgery.  I have reviewed the patient's chart and labs.  Questions were answered to the patient's satisfaction.    I have re-reviewed the the patient's records, history, medications, and allergies.  I have re-examined the patient.  I again discussed intraoperative plans and goals of post-operative recovery.  The patient agrees to proceed.  Samantha Lutz  Aug 10, 1950 [id number]  Patient Care Team: Lance Sell, NP as PCP - General (Nurse Practitioner) Delsa Bern, MD (Obstetrics and Gynecology) Melissa Noon, [place] (Optometry) [name], MD as Consulting Physician (General Surgery)  Patient Active Problem List   Diagnosis Date Noted  . 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 Encounter for general adult medical examination with abnormal findings 04/25/2017  . Hypothyroid 01/16/2013  . Right knee pain   . Elevated LFTs 01/03/2011  . Hypertension 12/19/2010  . Obese     Past Medical History:  Diagnosis Date  . Anemia   . Arthritis   . Fatty liver    ultrasound December 2012  . Hypertension    diet controlled  . Hypothyroid 01/16/2013 dx  . Irregular heartbeat    PER PATIENT REPORT; ONSET 20+YEARS AGO SAW CARDIOLOGY AT THE TIME C/O "ITS BEAT REALLY FAST FOR A MINUTE AND THEN STOPPED " DENIES ANY OTHER CARDIAC SX';  REPORTS CARDIO DID ECHO WHICH WAS NEGATIVE;  NOW COMES AND GOES ;   . Lichen sclerosus     steroids as needed  . Temporary low platelet count ([facility])    SEE LAST LABS IN EPIC    Past Surgical History:  Procedure Laterality Date  . APPENDECTOMY  1967  . [place]    Social History   Socioeconomic History  . Marital status: Married    Spouse name: Not on file  . Number of children: Not on file  . Years of education: Not on file  . Highest education level: Not on file  Occupational History  . Not on file  Social Needs  . Financial resource strain: Not on file  . Food insecurity:    Worry: Not on file    Inability: Not on file  . Transportation needs:    Medical: Not on file    Non-medical: Not on file  Tobacco Use  . Smoking status: Never Smoker  . Smokeless tobacco: Never Used  Substance and Sexual Activity  . Alcohol use: No  . Drug use: No  . Sexual activity: Not on file  Lifestyle  . Physical activity:    Days per week: Not on file    Minutes per session: Not on file  . Stress: Not on file  Relationships  . Social connections:    Talks on phone: Not on file    Gets together: Not on file    Attends religious service: Not on file    Active member of club or organization: Not on file    Attends meetings of clubs or organizations: Not on file  Relationship status: Not on file  . Intimate partner violence:    Fear of current or ex partner: Not on file    Emotionally abused: Not on file    Physically abused: Not on file    Forced sexual activity: Not on file  Other Topics Concern  . Not on file  Social History Narrative  . Not on file    Family History  Problem Relation Age of Onset  . Diabetes Mother   . Hypertension Mother   . COPD Mother   . Glaucoma Father 48  . Stroke Maternal Grandmother 23  . Hypertension Maternal Grandmother   . Rheum arthritis Sister 48  . Hypertension Daughter   . Breast cancer Cousin   . Colon cancer Neg Hx   . Esophageal cancer Neg Hx   . Liver cancer Neg Hx   . Pancreatic cancer Neg Hx    . Stomach cancer Neg Hx   . Rectal cancer Neg Hx     Facility-Administered Medications Prior to Admission  Medication Dose Route Frequency Provider Last Rate Last Dose  . 0.9 %  sodium chloride infusion  500 mL Intravenous Continuous Nelida Meuse III, MD       Medications Prior to Admission  Medication Sig Dispense Refill Last Dose  . clobetasol ointment (TEMOVATE) 0.35 % Apply 1 application topically daily as needed (irritation).   12/19/2017  . levothyroxine (SYNTHROID, LEVOTHROID) 50 MCG tablet TAKE 1 TABLET BY MOUTH ONCE DAILY (Patient taking differently: Take 25 mcg by mouth daily before breakfast. ) 90 tablet 0 12/21/2017 at 0400  . metroNIDAZOLE (FLAGYL) 500 MG tablet Take 1,000 mg by mouth 3 (three) times daily.   0 12/20/2017 at 2200  . neomycin (MYCIFRADIN) 500 MG tablet Take 1,000 mg by mouth 3 (three) times daily.   0 12/20/2017 at 2200  . capecitabine (XELODA) 500 MG tablet Take 4 tablets (2000mg ) by mouth in AM & 3 tabs (1500mg ) in PM, within 2min of finishing food. Take on radiation days only, M-F (Patient not taking: Reported on 11/12/2017) 196 tablet 0 Not Taking at Unknown time  . prochlorperazine (COMPAZINE) 10 MG tablet Take 1 tablet (10 mg total) by mouth every 6 (six) hours as needed for nausea or vomiting. (Patient not taking: Reported on 10/12/2017) 30 tablet 0 Not Taking at Unknown time    Current Facility-Administered Medications  Medication Dose Route Frequency Provider Last Rate Last Dose  . bisacodyl (DULCOLAX) EC tablet 20 mg  20 mg Oral Once [name], MD      . bupivacaine liposome (EXPAREL) 1.3 % injection 266 mg  20 mL Infiltration On Call to OR [name], MD      . cefoTEtan (CEFOTAN) 2 g in sodium chloride 0.9 % 100 mL IVPB  2 g Intravenous On Call to OR [name], MD      . Chlorhexidine Gluconate Cloth 2 % PADS 6 each  6 each Topical Once [name], MD       And  . Chlorhexidine Gluconate Cloth 2 % PADS 6 each  6 each Topical Once  [name], MD      . clindamycin (CLEOCIN) 900 mg, gentamicin (GARAMYCIN) 240 mg in sodium chloride 0.9 % 1,000 mL for intraperitoneal lavage   Intraperitoneal On Call to OR [name], MD      . lactated ringers infusion   Intravenous Continuous Barnet Glasgow, MD 100 mL/hr at 12/21/17 0641 1,000 mL at 12/21/17 0641  . neomycin (MYCIFRADIN)  tablet 1,000 mg  1,000 mg Oral 3 times per day [name], MD       And  . metroNIDAZOLE (FLAGYL) tablet 1,000 mg  1,000 mg Oral 3 times per day [name], MD      . polyethylene glycol powder (GLYCOLAX/MIRALAX) container 255 g  1 Container Oral Once [name], MD         Allergies  Allergen Reactions  . Epinephrine Palpitations  . Losartan Hives    BP (!) 171/76   Pulse 98   Temp 98.3 F (36.8 C) (Oral)   Resp 18   Ht 5\' 3"  (1.6 m)   Wt 108.4 kg   SpO2 98%   BMI 42.34 kg/m   Labs: No results found for this or any previous visit (from the past 48 hour(s)).  Imaging / Studies: Dg Chest 2 View  Result Date: 12/13/2017 CLINICAL DATA:  Acute LEFT chest pain today. EXAM: CHEST - 2 VIEW COMPARISON:  08/17/2017 chest CT FINDINGS: The cardiomediastinal silhouette is unremarkable. Mild chronic peribronchial thickening again noted. There is no evidence of focal airspace disease, pulmonary edema, suspicious pulmonary nodule/mass, pleural effusion, or pneumothorax. No acute bony abnormalities are identified. IMPRESSION: No active cardiopulmonary disease. Electronically Signed   By: Margarette Canada M.D.   On: 12/13/2017 11:34     .Adin Hector, M.D., F.A.C.S. Gastrointestinal and Minimally Invasive Surgery [facility] [address], [place], [place] [address] [phone] Main / Paging  12/21/2017 7:15 AM    Adin Hector

## 2017-12-21 NOTE — Op Note (Signed)
12/21/2017  12:52 PM  PATIENT:  Samantha Lutz  67 y.o. female  Patient Care Team: Lance Sell, NP as PCP - General (Nurse Practitioner) Delsa Bern, MD (Obstetrics and Gynecology) Melissa Noon, [place] (Optometry) [name], MD as Consulting Physician (General Surgery) Danis, Kirke Corin, MD as Consulting Physician (Gastroenterology) Ladell Pier, MD as Consulting Physician (Oncology) Kyung Rudd, MD as Consulting Physician (Radiation Oncology)  PRE-OPERATIVE DIAGNOSIS:  Rectal cancer  POST-OPERATIVE DIAGNOSIS:  Rectal cancer  PROCEDURE:   XI ROBOTIC ASSISTED LOWER ANTERIOR RECTOSIGMOID RESECTION Evaluation of colon and rectal perfusion using firefly immunofluorescence DIVERTING LOOP ILEOSTOMY RIGID PROCTOSCOPY  SURGEON:  Adin Hector, MD  ASSISTANT: Nadeen Landau, MD An experienced assistant was required given the standard of surgical care given the complexity of the case.  This assistant was needed for exposure, dissection, suctioning, retraction, instrument exchange, etc.  ANESTHESIA:   local and general  EBL:  Total I/O In: 1000 [I.V.:1000] Out: 550 [Urine:350; Blood:200]  Delay start of Pharmacological VTE agent (>24hrs) due to surgical blood loss or risk of bleeding:  no  DRAINS: 16 [place] Blake drain goes from right mid abdomen down into the pelvis around the low anastomosis  SPECIMEN:   Rectosigmoid colon.  Distal rectal anastomotic ring  DISPOSITION OF SPECIMEN:  PATHOLOGY  COUNTS:  YES  PLAN OF CARE: Admit to inpatient   PATIENT DISPOSITION:  PACU - hemodynamically stable.  INDICATION:    Pleasant morbidly obese female found to have bulky mid rectal tumor.  Staged as T3.  Underwent neoadjuvant chemoradiation therapy.  Ready for resection.  I recommended segmental resection:  The anatomy & physiology of the digestive tract was discussed.  The pathophysiology of the rectal pathology was discussed.  Natural history risks  without surgery was discussed.   I worked to give an overview of the disease and the frequent need to have multispecialty involvement.  I feel the risks of no intervention will lead to serious problems that outweigh the operative risks; therefore, I recommended a partial proctocolectomy to remove the pathology.  Minimally Invasive (Robotic/Laparoscopic) & open techniques were discussed.  We will work to preserve anal & pelvic floor function without sacrificing cure.  Risks such as bleeding, infection, abscess, leak, reoperation, possible temporary or permanent ostomy, hernia, stroke, heart attack, death, and other risks were discussed.  I noted a good likelihood this will help address the problem.   Goals of post-operative recovery were discussed as well.  We will work to minimize complications.  Educational information was available as well.  Questions were answered.  The patient expresses understanding & wishes to proceed with surgery.  OR FINDINGS:   Patient had circumferential scarring in the mid rectum just distal to the low peritoneal reflection around 7-8 cm from the sphincters consistent with treated cancer.  Tattooing proximal and distal to it.  Resected.  No obvious metastatic disease on visceral parietal peritoneum or liver.  The anastomosis rests 4-5 cm from the anal verge by rigid proctoscopy.  DESCRIPTION:   Informed consent was confirmed.  The patient underwent general anaesthesia without difficulty.  The patient was positioned appropriately.  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  VTE prevention in place.  The patient's abdomen was clipped, prepped, & draped in a sterile fashion.  Surgical timeout confirmed our plan.  The patient was positioned in reverse Trendelenburg.  Abdominal entry was gained using Varess technique along the left subcostal ridge.  I induced carbon  dioxide insufflation.  End-tidal CO2 not elevated.  Insufflated to 15 mm.   Port carefully placed in upper abdomen.  Camera inspection revealed no injury.  Extra ports were carefully placed under direct laparoscopic visualization.  I freed some greater omentum off the infraumbilical midline.  Assured hemostasis.  What omentum had some he is to the right lower quadrant.  The patient was carefully positioned.  The Intuitive daVinci robot was carefully docked with camera & instruments carefully placed.  We freed adhesions of greater omentum to the right lower quadrant and ileocecal region to help reduce it into the upper abdomen.  The patient had torturous sigmoid colon.  I mobilized the left colon somewhat lateral to medial to help straighten out better.  With this I can elevate the dilated sigmoid colon and better expose the sigmoid mesentery and mesorectum.  I scored the base of peritoneum of the medial side of the mesentery of the left colon from the ligament of Treitz to the peritoneal reflection of the mid rectum.   I elevated the sigmoid mesentery and entered into the retro-mesenteric plane. We were able to identify the left ureter and gonadal vessels. We kept those posterior within the retroperitoneum and elevated the left colon mesentery off that. I did isolate the inferior mesenteric artery (IMA) pedicle but did not ligate it yet.  I continued distally and got into the avascular plane posterior to the mesorectum. This allowed me to help mobilize the rectum as well by freeing the mesorectum off the sacrum.  I mobilized the peritoneal coverings towards the peritoneal reflection on both the right and left sides of the rectum.  I stayed away from the right and left ureters.  I kept the lateral vascular pedicles to the rectum intact.  I skeletonized the lymph nodes off the inferior mesenteric artery pedicle.  I went down to its takeoff from the aorta.  I isolated the inferior mesenteric vein off of the ligament of Treitz just cephalad to that as well.  After confirming the left  ureter was out of the way, I went ahead and ligated the inferior mesenteric artery pedicle just near its takeoff from the aorta.  I did ligate the inferior mesenteric vein in a similar fashion.  We ensured hemostasis. I skeletonized the mesorectum at the junction at the proximal rectum for the distal point of resection.  That I could better mobilize the left colon mesentery off the retroperitoneum.  I mobilized the left colon in a lateral to medial fashion off the line of Toldt up   towards the splenic flexure to ensure good mobilization of the remaining left colon to reach into the pelvis.    I then focused on mesorectal excision.  Elevated the rectosigmoid anteriorly and follow the presacral plane to resect the mesorectum off his attachments to that in a posterior fashion.  I came around anteriorly around the peritoneal coverings on the lateral pedicles in the anterior peritoneal reflection which was quite distal.  Encountered tattooing in that region.  Mobilized the anterior mid and distal rectum off the rectovaginal septum.  Went again posteriorly and mobilized the rectum off the sacrum coccyx and pelvic floor for good posterior mobilization.  I then did a digital and later rigid proctoscopy scopic evaluation to confirm where the lesion was in a stellate strictured region of the distal mid rectum.  I transected the lateral pedicles right and left distal to this region.  I transected through the mesorectum at the mid/distal junction was able to  skeletonize it fully.  Confirmed that I had good distal margin of at least 3 cm.  I transected at the distal rectum with a robotic stapler firing.  2 firings with one final one on the corner.  With that I can reduce the rectosigmoid out of the pelvis.  Confirm I had good mesorectal excision and the rest of the pelvis was clear.  I chose a region at the descending/sigmoid junction that was soft and easily reached down to the rectal stump.  I transected the mesentery of  the colon radially to preserve remaining colon blood supply.  I had anesthesia intravenously infuse firefly.  Could see good green immunofluorescence rapidly fill up the colon up to the transection point at the distal descending colon.  Could also see the remaining rectal stump had excellent perfusion as well.  I evaluated the ileo-cecal region.  Mobilize the cecum in a lateral medial fashion although it had somewhat of a bascule already.  Chose a region in the distal ileum about a foot proximal to the ileocecal valve to easily reached over to the left iliac crest.  Seem like a good region for a protecting loop ileostomy in this morbidly obese woman.  Placed a red rubber catheter through the mesentery in this region and clamped that off.  I created an extraction incision through a small Pfannenstiel incision in the suprapubic region.  Placed a wound protector.  I was able to eviscerate the rectosigmoid and descending colon out the wound.   I clamped the colon proximal to this area using a reusable pursestringer device.  Passed a 2-0 Keith needle. I transected at the descending/sigmoid junction with a scalpel. I got healthy bleeding mucosa.  We sent the rectosigmoid colon specimen off to go to pathology.  We sized the colon orifice.  I chose a 33 EEA anvil stapler system.  I reinforced the prolene pursestring with interrupted silk suture.  I placed the anvil to the open end of the proximal remaining colon and closed around it using the pursestring.    We did copious irrigation with crystalloid solution.  Hemostasis was good.  The distal end of the remaining colon easily reached down to the rectal stump, therefore, splenic flexure mobilization was not needed.      Dr. Dema Severin scrubbed down and did gentle anal dilation and advanced the EEA stapler up the rectal stump. The spike was brought out at the provimal end of the rectal stump under direct visualization.  I  attached the anvil of the proximal colon the spike  of the stapler. Anvil was tightened down and held clamped for 60 seconds. The EEA stapler was fired and held clamped for 30 seconds. The stapler was released & removed. We noted 2 excellent anastomotic rings. Blue stitch is in the proximal ring.  Dr Dema Severin did rigid proctoscopy noted the anastomosis was at 4-5 cm from the anal verge consistent with the distal rectum.  Confirmed digitally as well.  Given her morbid obesity with chemoradiation therapy preoperatively and low anastomosis, I felt the patient required diverting loop ileostomy to protect to the low colorectal anastomosis.  Dr. Dema Severin agreed.  We did a final irrigation of antibiotic solution (900 mg clindamycin/240 mg gentamicin in a liter of crystalloid) & held that for the pelvic air leak test .  The rectum was insufflated the rectum while clamping the colon proximal to that anastomosis.  There was a negative air leak test. There was no tension of mesentery or bowel at the  anastomosis.   Tissues looked viable.  Ureters & bowel uninjured.  The anastomosis looked healthy.  Hemostasis was good.  Drain placed through the right paramedian 5 mm cyst port down into the pelvis.  Secured to the skin with a 2-0 blue Prolene suture.  I created a 3 cm circular defect in the right upper paramedian region at the premarked ileostomy site.  Excised the conus subcutaneous tissues and she had quite a thick abdominal wall.  Came through the anterior rectus fascia transversely.  Split through the right rectus muscle and posterior rectus fascia and peritoneum vertically.  I was able to get a red rubber catheter up through the wound to bring up the loop ileostomy.  Confirmed proper orientation with the proximal end superior.  No twisting or torsion.    Ports & wound protector removed.  We changed gloves & redraped the patient per colon SSI prevention protocol.  We aspirated the antibiotic irrigation.  Hemostasis was good.  Sterile unused instruments were used from this  point.  I closed the skin at the port sites using Monocryl stitch and sterile dressing.  I closed the extraction wound using a 0 Vicryl vertical peritoneal closure and a #1 PDS transverse anterior rectal fascial closure like a small Pfannenstiel closure. I closed the skin with some interrupted Monocryl stitches. I placed antibiotic-soaked wicks into the closure at the corners x2.  I placed sterile dressings.  Matured the loop ileostomy in the right upper quadrant.  I used 3-0 Vicryl Interrupted sutures.  I opened up the ileum eccentrically so that the distal limb was flush with the skin.  Proximal limb rolled over to get a good Brooke ileostomy.  I telescoped out some ileum out such that she had a 35 mm ileostomy rosebud.  Some mild ischemia but viable.  Circumferential dermal/seromuscular sutures made for good eventrated Brooke ileostomy.  Proximal limb is superior and about 80% of the ostomy..  Distal limb decompressed and 6:00 inferiorly.  Ileostomy appliance placed.  Patient extubated go to the PACU recovery room. I had discussed postop care with the patient in detail the office.  The skin with the patient and her extended family in the holding area. Instructions are written. I discussed operative findings, updated the patient's status, discussed probable steps to recovery, and gave postoperative recommendations to the patient's spouse.  Recommendations were made.  Questions were answered.  He expressed understanding & appreciation.  Adin Hector, M.D., F.A.C.S. Gastrointestinal and Minimally Invasive Surgery [facility] [address] [place], [place] [address] [phone] Main / Paging

## 2017-12-21 NOTE — Anesthesia Postprocedure Evaluation (Signed)
Anesthesia Post Note  Patient: SHANNELLE ALGUIRE  Procedure(s) Performed: XI ROBOTIC ASSISTED LOWER ANTERIOR RESECTION (N/A ) DIVERTING LOOP OSTOMY (N/A ) RIGID PROCTOSCOPY (N/A )     Patient location during evaluation: PACU Anesthesia Type: General Level of consciousness: awake and alert Pain management: pain level controlled Vital Signs Assessment: post-procedure vital signs reviewed and stable Respiratory status: spontaneous breathing, nonlabored ventilation and respiratory function stable Cardiovascular status: blood pressure returned to baseline and stable Postop Assessment: no apparent nausea or vomiting Anesthetic complications: yes Anesthetic complication details: injury of corneaComments: Notified by RN on floor that pt c/o left eye pain. On my exam, pt c/o burning pain in her left eye that she noticed after leaving PACU. Visual acuity normal. No obvious ocular lesions. Orders written for corneal abrasion protocol. Pt informed to notify anesthesia if pain is not subsiding within 24 hours or for any other concerns. Daiva Huge, MD    Last Vitals:  Vitals:   12/21/17 1430 12/21/17 1505  BP: (!) 164/77 (!) 174/77  Pulse: 99 98  Resp: 16 18  Temp:  (!) 36.4 C  SpO2: 100% 97%    Last Pain:  Vitals:   12/21/17 1505  TempSrc: Oral  PainSc:                  Brennan Bailey

## 2017-12-21 NOTE — Anesthesia Procedure Notes (Addendum)
Procedure Name: Intubation Date/Time: 12/21/2017 7:42 AM Performed by: Montel Clock, CRNA Pre-anesthesia Checklist: Patient identified, Emergency Drugs available, Suction available, Patient being monitored and Timeout performed Patient Re-evaluated:Patient Re-evaluated prior to induction Oxygen Delivery Method: Circle system utilized Preoxygenation: Pre-oxygenation with 100% oxygen Induction Type: IV induction, Rapid sequence and Cricoid Pressure applied Laryngoscope Size: Glidescope and 4 Grade View: Grade II Tube type: Parker flex tip Tube size: 7.0 mm Number of attempts: 1 Airway Equipment and Method: Rigid stylet and Video-laryngoscopy Placement Confirmation: ETT inserted through vocal cords under direct vision,  positive ETCO2 and breath sounds checked- equal and bilateral Secured at: 21 cm Tube secured with: Tape Dental Injury: Teeth and Oropharynx as per pre-operative assessment  Comments: Elective glidescope, required manipulation of blade to lift epiglottis.

## 2017-12-21 NOTE — Transfer of Care (Signed)
Immediate Anesthesia Transfer of Care Note  Patient: Samantha Lutz  Procedure(s) Performed: XI ROBOTIC ASSISTED LOWER ANTERIOR RESECTION (N/A ) DIVERTING LOOP OSTOMY (N/A ) RIGID PROCTOSCOPY (N/A )  Patient Location: PACU  Anesthesia Type:General  Level of Consciousness: drowsy and patient cooperative  Airway & Oxygen Therapy: Patient Spontanous Breathing and Patient connected to face mask oxygen  Post-op Assessment: Report given to RN and Post -op Vital signs reviewed and stable  Post vital signs: Reviewed and stable  Last Vitals:  Vitals Value Taken Time  BP 158/79 12/21/2017  1:30 PM  Temp    Pulse 112 12/21/2017  1:32 PM  Resp 14 12/21/2017  1:32 PM  SpO2 100 % 12/21/2017  1:32 PM  Vitals shown include unvalidated device data.  Last Pain:  Vitals:   12/21/17 1330  TempSrc:   PainSc: (P) Asleep      Patients Stated Pain Goal: 4 (42/70/62 3762)  Complications: No apparent anesthesia complications

## 2017-12-22 ENCOUNTER — Encounter (HOSPITAL_COMMUNITY): Payer: Self-pay | Admitting: Surgery

## 2017-12-22 LAB — BASIC METABOLIC PANEL
Anion gap: 7 (ref 5–15)
BUN: 15 mg/dL (ref 8–23)
CO2: 24 mmol/L (ref 22–32)
Calcium: 8.6 mg/dL — ABNORMAL LOW (ref 8.9–10.3)
Chloride: 106 mmol/L (ref 98–111)
Creatinine, Ser: 0.91 mg/dL (ref 0.44–1.00)
GFR calc Af Amer: >60 mL/min (ref >60)
GFR calc non Af Amer: >60 mL/min (ref >60)
Glucose, Bld: 139 mg/dL — ABNORMAL HIGH (ref 70–99)
Potassium: 5.1 mmol/L (ref 3.5–5.1)
Sodium: 137 mmol/L (ref 135–145)

## 2017-12-22 LAB — CBC
HCT: 30.1 % — ABNORMAL LOW (ref 36.0–46.0)
Hemoglobin: 9.9 g/dL — ABNORMAL LOW (ref 12.0–15.0)
MCH: 32.6 pg (ref 26.0–34.0)
MCHC: 32.9 g/dL (ref 30.0–36.0)
MCV: 99 fL (ref 80.0–100.0)
Platelets: 177 10*3/uL (ref 150–400)
RBC: 3.04 MIL/uL — ABNORMAL LOW (ref 3.87–5.11)
RDW: 13.8 % (ref 11.5–15.5)
WBC: 12.8 10*3/uL — ABNORMAL HIGH (ref 4.0–10.5)
nRBC: 0 % (ref 0.0–0.2)

## 2017-12-22 LAB — MAGNESIUM: Magnesium: 1.9 mg/dL (ref 1.7–2.4)

## 2017-12-22 NOTE — Progress Notes (Signed)
1 Day Post-Op   Subjective/Chief Complaint: A little nausea last night Having hiccups with oral intake Walked some Having some fluid per rectum Husband at bedside  Objective: Vital signs in last 24 hours: Temp:  [97.5 F (36.4 C)-98.3 F (36.8 C)] 98.2 F (36.8 C) (11/23 0707) Pulse Rate:  [81-119] 96 (11/23 0707) Resp:  [12-20] 20 (11/23 0039) BP: (143-182)/(60-85) 155/69 (11/23 0707) SpO2:  [96 %-100 %] 96 % (11/23 0707) Weight:  [110.4 kg] 110.4 kg (11/23 0707) Last BM Date: 12/22/17  Intake/Output from previous day: 11/22 0701 - 11/23 0700 In: 3475 [I.V.:3475] Out: 1975 [Urine:1350; Drains:325; Stool:100; Blood:200] Intake/Output this shift: Total I/O In: 560 [P.O.:360; I.V.:200] Out: 170 [Urine:150; Drains:20]  Alert, nontoxic cta Reg Obese, soft, min TTP; bruising RLQ; liquid stool in bag +SCDs, no edema  Lab Results:  Recent Labs    12/22/17 0324  WBC 12.8*  HGB 9.9*  HCT 30.1*  PLT 177   BMET Recent Labs    12/22/17 0324  NA 137  K 5.1  CL 106  CO2 24  GLUCOSE 139*  BUN 15  CREATININE 0.91  CALCIUM 8.6*   PT/INR No results for input(s): LABPROT, INR in the last 72 hours. ABG No results for input(s): PHART, HCO3 in the last 72 hours.  Invalid input(s): PCO2, PO2  Studies/Results: Dg Pelvis Portable  Result Date: 12/21/2017 CLINICAL DATA:  Inaccurate instrument count during surgery earlier today for rectal cancer. Abdomen radiographs obtained earlier today did not include the area of surgery in the pelvis. EXAM: PORTABLE PELVIS 1-2 VIEWS COMPARISON:  Abdomen radiographs obtained earlier today. FINDINGS: A pelvic surgical drain is demonstrated with its tip in the mid pelvis to the left of midline. There are surgical staples in the inferior pelvis in the midline. No retained surgical instruments are seen. Lumbar spine degenerative changes are noted as well as right lateral subcutaneous emphysema. IMPRESSION: No retained surgical instruments.  Electronically Signed   By: Claudie Revering M.D.   On: 12/21/2017 16:04   Dg Abd Portable 1v  Result Date: 12/21/2017 CLINICAL DATA:  Incorrect count following robotic assisted lower anterior resection of rectal cancer. EXAM: PORTABLE ABDOMEN - 1 VIEW COMPARISON:  Chest, abdomen and pelvis CT dated 08/17/2017. FINDINGS: Two radiographs of the abdomen are submitted for interpretation. These do not include the mid and inferior portions of the pelvis. Right lateral subcutaneous air is noted. The visualized bowel gas pattern is normal. No radiopaque foreign bodies are seen on these images other than probable surgical drains partially included overlying the upper pelvis. Lumbar and lower thoracic spine degenerative changes are noted. IMPRESSION: No radiopaque foreign body seen. However, the area of surgery was not included if this was rectal surgery. Therefore, a repeat radiograph to include the pelvis is recommended. These results will be called to the ordering clinician or representative by the Radiologist Assistant, and communication documented in the PACS or zVision Dashboard. Electronically Signed   By: Claudie Revering M.D.   On: 12/21/2017 13:53    Anti-infectives: Anti-infectives (From admission, onward)   Start     Dose/Rate Route Frequency Ordered Stop   12/21/17 2000  cefoTEtan (CEFOTAN) 2 g in sodium chloride 0.9 % 100 mL IVPB     2 g 200 mL/hr over 30 Minutes Intravenous Every 12 hours 12/21/17 1516 12/21/17 2032   12/21/17 1400  neomycin (MYCIFRADIN) tablet 1,000 mg  Status:  Discontinued     1,000 mg Oral 3 times per day 12/21/17 0546 12/21/17 1456  12/21/17 1400  metroNIDAZOLE (FLAGYL) tablet 1,000 mg  Status:  Discontinued     1,000 mg Oral 3 times per day 12/21/17 0546 12/21/17 1456   12/21/17 1245  clindamycin (CLEOCIN) 900 mg, gentamicin (GARAMYCIN) 240 mg in sodium chloride 0.9 % 1,000 mL for intraperitoneal lavage  Status:  Discontinued       As needed 12/21/17 1245 12/21/17 1331    12/21/17 0600  clindamycin (CLEOCIN) 900 mg, gentamicin (GARAMYCIN) 240 mg in sodium chloride 0.9 % 1,000 mL for intraperitoneal lavage  Status:  Discontinued      Intraperitoneal On call to O.R. 12/20/17 0741 12/21/17 1456   12/21/17 0600  cefoTEtan (CEFOTAN) 2 g in sodium chloride 0.9 % 100 mL IVPB     2 g 200 mL/hr over 30 Minutes Intravenous On call to O.R. 12/21/17 0546 12/21/17 0755      Assessment/Plan: s/p Procedure(s): XI ROBOTIC ASSISTED LOWER ANTERIOR RESECTION (N/A) DIVERTING LOOP OSTOMY (N/A) RIGID PROCTOSCOPY (N/A)  Doing well Will keep on clears today Cont chemical vte prophylaxis but will repeat cbc in am given drop in hgb Ambulate, pulm toilet Cont foley until pod 2 Monitor RLQ incision   LOS: 1 day    Greer Pickerel 12/22/2017

## 2017-12-23 LAB — CBC
HCT: 28.3 % — ABNORMAL LOW (ref 36.0–46.0)
Hemoglobin: 9 g/dL — ABNORMAL LOW (ref 12.0–15.0)
MCH: 31.8 pg (ref 26.0–34.0)
MCHC: 31.8 g/dL (ref 30.0–36.0)
MCV: 100 fL (ref 80.0–100.0)
Platelets: 155 10*3/uL (ref 150–400)
RBC: 2.83 MIL/uL — ABNORMAL LOW (ref 3.87–5.11)
RDW: 13.6 % (ref 11.5–15.5)
WBC: 10.4 10*3/uL (ref 4.0–10.5)
nRBC: 0 % (ref 0.0–0.2)

## 2017-12-23 NOTE — Progress Notes (Signed)
2 Days Post-Op   Subjective/Chief Complaint: Tiny bit of nausea when first get up [facility] multiple times Liquid going well Feels better   Objective: Vital signs in last 24 hours: Temp:  [98.6 F (37 C)] 98.6 F (37 C) (11/24 0600) Pulse Rate:  [76-91] 91 (11/24 0600) Resp:  [16-20] 20 (11/24 0600) BP: (147-173)/(64-77) 147/64 (11/24 0600) SpO2:  [96 %-99 %] 99 % (11/24 0600) Weight:  [111.1 kg] 111.1 kg (11/24 0500) Last BM Date: 12/22/17  Intake/Output from previous day: 11/23 0701 - 11/24 0700 In: 1040 [P.O.:840; I.V.:200] Out: 3150 [Urine:2500; Drains:100; Stool:550] Intake/Output this shift: Total I/O In: 240 [P.O.:240] Out: 275 [Urine:275]  Alert, sitting in chair cta Reg Soft, obese, min TTP, ostomy with liquid stool in bag; drain serosang No edema  Lab Results:  Recent Labs    12/22/17 0324 12/23/17 0405  WBC 12.8* 10.4  HGB 9.9* 9.0*  HCT 30.1* 28.3*  PLT 177 155   BMET Recent Labs    12/22/17 0324  NA 137  K 5.1  CL 106  CO2 24  GLUCOSE 139*  BUN 15  CREATININE 0.91  CALCIUM 8.6*   PT/INR No results for input(s): LABPROT, INR in the last 72 hours. ABG No results for input(s): PHART, HCO3 in the last 72 hours.  Invalid input(s): PCO2, PO2  Studies/Results: Dg Pelvis Portable  Result Date: 12/21/2017 CLINICAL DATA:  Inaccurate instrument count during surgery earlier today for rectal cancer. Abdomen radiographs obtained earlier today did not include the area of surgery in the pelvis. EXAM: PORTABLE PELVIS 1-2 VIEWS COMPARISON:  Abdomen radiographs obtained earlier today. FINDINGS: A pelvic surgical drain is demonstrated with its tip in the mid pelvis to the left of midline. There are surgical staples in the inferior pelvis in the midline. No retained surgical instruments are seen. Lumbar spine degenerative changes are noted as well as right lateral subcutaneous emphysema. IMPRESSION: No retained surgical instruments. Electronically Signed    By: Claudie Revering M.D.   On: 12/21/2017 16:04   Dg Abd Portable 1v  Result Date: 12/21/2017 CLINICAL DATA:  Incorrect count following robotic assisted lower anterior resection of rectal cancer. EXAM: PORTABLE ABDOMEN - 1 VIEW COMPARISON:  Chest, abdomen and pelvis CT dated 08/17/2017. FINDINGS: Two radiographs of the abdomen are submitted for interpretation. These do not include the mid and inferior portions of the pelvis. Right lateral subcutaneous air is noted. The visualized bowel gas pattern is normal. No radiopaque foreign bodies are seen on these images other than probable surgical drains partially included overlying the upper pelvis. Lumbar and lower thoracic spine degenerative changes are noted. IMPRESSION: No radiopaque foreign body seen. However, the area of surgery was not included if this was rectal surgery. Therefore, a repeat radiograph to include the pelvis is recommended. These results will be called to the ordering clinician or representative by the Radiologist Assistant, and communication documented in the PACS or zVision Dashboard. Electronically Signed   By: Claudie Revering M.D.   On: 12/21/2017 13:53    Anti-infectives: Anti-infectives (From admission, onward)   Start     Dose/Rate Route Frequency Ordered Stop   12/21/17 2000  cefoTEtan (CEFOTAN) 2 g in sodium chloride 0.9 % 100 mL IVPB     2 g 200 mL/hr over 30 Minutes Intravenous Every 12 hours 12/21/17 1516 12/21/17 2032   12/21/17 1400  neomycin (MYCIFRADIN) tablet 1,000 mg  Status:  Discontinued     1,000 mg Oral 3 times per day 12/21/17 0546 12/21/17  1456   12/21/17 1400  metroNIDAZOLE (FLAGYL) tablet 1,000 mg  Status:  Discontinued     1,000 mg Oral 3 times per day 12/21/17 0546 12/21/17 1456   12/21/17 1245  clindamycin (CLEOCIN) 900 mg, gentamicin (GARAMYCIN) 240 mg in sodium chloride 0.9 % 1,000 mL for intraperitoneal lavage  Status:  Discontinued       As needed 12/21/17 1245 12/21/17 1331   12/21/17 0600  clindamycin  (CLEOCIN) 900 mg, gentamicin (GARAMYCIN) 240 mg in sodium chloride 0.9 % 1,000 mL for intraperitoneal lavage  Status:  Discontinued      Intraperitoneal On call to O.R. 12/20/17 0741 12/21/17 1456   12/21/17 0600  cefoTEtan (CEFOTAN) 2 g in sodium chloride 0.9 % 100 mL IVPB     2 g 200 mL/hr over 30 Minutes Intravenous On call to O.R. 12/21/17 0546 12/21/17 0755      Assessment/Plan: s/p Procedure(s): XI ROBOTIC ASSISTED LOWER ANTERIOR RESECTION (N/A) DIVERTING LOOP OSTOMY (N/A) RIGID PROCTOSCOPY (N/A) Dr Johney [place]  Looks great today - appears much more comfortable Vitals ok. No wbc.  Soft diet Ostomy teaching vte prophylaxis Ambulate Repeat bmet in am Home in next 24-48hrs Waiting on wound care consult  LOS: 2 days    Greer Pickerel 12/23/2017

## 2017-12-24 LAB — BASIC METABOLIC PANEL
Anion gap: 6 (ref 5–15)
BUN: 16 mg/dL (ref 8–23)
CO2: 29 mmol/L (ref 22–32)
Calcium: 8.4 mg/dL — ABNORMAL LOW (ref 8.9–10.3)
Chloride: 101 mmol/L (ref 98–111)
Creatinine, Ser: 0.81 mg/dL (ref 0.44–1.00)
GFR calc Af Amer: >60 mL/min (ref >60)
GFR calc non Af Amer: >60 mL/min (ref >60)
Glucose, Bld: 107 mg/dL — ABNORMAL HIGH (ref 70–99)
Potassium: 4.8 mmol/L (ref 3.5–5.1)
Sodium: 136 mmol/L (ref 135–145)

## 2017-12-24 LAB — MAGNESIUM: Magnesium: 2 mg/dL (ref 1.7–2.4)

## 2017-12-24 MED ORDER — LOPERAMIDE HCL 2 MG PO CAPS: 2.0000 mg | ORAL_CAPSULE | Freq: Three times a day (TID) | ORAL | Status: DC | PRN

## 2017-12-24 MED ORDER — LACTATED RINGERS IV BOLUS: 1000.0000 mL | Freq: Three times a day (TID) | INTRAVENOUS | Status: DC | PRN

## 2017-12-24 NOTE — Plan of Care (Signed)
  Problem: Nutrition: Goal: Adequate nutrition will be maintained Outcome: Progressing   Problem: Safety: Goal: Ability to remain free from injury will improve Outcome: Progressing   Problem: Pain Managment: Goal: General experience of comfort will improve Outcome: Progressing   

## 2017-12-24 NOTE — Consult Note (Addendum)
[place] Nurse ostomy consult note Stoma type/location: Ileostomy stoma to right abd Stomal assessment/size:  1 1/2 inches; red and viable, above skin level Peristomal assessment: intact skin surrounding Output: small amt brown drainage in the pouch  Ostomy pouching: 2pc.  Education provided:  Demonstrated pouch change and husband assisted using barrier ring and 2 piece pouching system. Pt and husband were able to open and close the velcro to empty.  Discussed pouch change routine and ordering supplies.  Educational materials left at the bedside along with 5 sets of barrier rings and wafers and pouches.  Pt asked appropriate questions and could benefit from home health assistance after discharge. Enrolled patient in [place] program: Yes Julien Girt MSN, RN, [place], [place], [place]

## 2017-12-24 NOTE — Care Management Important Message (Signed)
Important Message  Patient Details  Name: Samantha Lutz MRN: [id number] Date of Birth: [birth year]   Medicare Important Message Given:  Yes    Kerin Salen 12/24/2017, 12:32 [place] Message  Patient Details  Name: Samantha Lutz MRN: [id number] Date of Birth: 22-Apr-1950   Medicare Important Message Given:  Yes    Kerin Salen 12/24/2017, 12:31 PM

## 2017-12-24 NOTE — Progress Notes (Signed)
[facility] education reinforced. Did patient attend class prior to procedure? Yes [  x ] No [   ] Discussed: Pain Control [x   ] Mobility [ x  ] Diet [   ] Other [   ]  Patient in bed- reports has been in chair for 2 hours already today. Encouraged more time out of bed once pain is under control. Questions answered. Will follow. Pecolia Ades, RN, BSN Quality Program Coordinator, Enhanced Recovery after Surgery 12/24/17 12:19 PM

## 2017-12-24 NOTE — Progress Notes (Signed)
Samantha Lutz [id number] [birth year]  CARE TEAM:  PCP: Lance Sell, NP  Outpatient Care Team: Patient Care Team: Lance Sell, NP as PCP - General (Nurse Practitioner) Delsa Bern, MD (Obstetrics and Gynecology) Melissa Noon, Imogene (Optometry) [name], MD as Consulting Physician (General Surgery) [place], Kirke Corin, MD as Consulting Physician (Gastroenterology) Ladell Pier, MD as Consulting Physician (Oncology) Kyung Rudd, MD as Consulting Physician (Radiation Oncology)  Inpatient Treatment Team: Treatment Team: Attending Provider: [name], MD; Technician: Sharren Bridge, NT; Technician: Shanon Payor, NT   Problem List:   Principal Problem:   Rectal cancer s/p robotic LAR resection & diverting loop ileostomy 12/21/2017 Active Problems:   Morbid obesity with body mass index of 40.0-49.9 (HCC)   Hypothyroid   Ileostomy in place [facility])   3 Days Post-Op  12/21/2017  POST-OPERATIVE DIAGNOSIS:  Rectal cancer  PROCEDURE:   XI ROBOTIC ASSISTED LOWER ANTERIOR RECTOSIGMOID RESECTION Evaluation of colon and rectal perfusion using firefly immunofluorescence DIVERTING LOOP ILEOSTOMY RIGID PROCTOSCOPY  SURGEON:  Adin Hector, MD    Assessment  Improving.  [facility] Stay = 3 days)  Plan:  -Solid diet.  Follow-up IV fluids.  Ileostomy care and teaching.  Watch for ileostomy diarrhea and output.  Imodium as needed.  Continue psyllium.  Does not seem severe at this point.  Follow-up on pathology.  Keep drain for now.  Most likely remove next week in office  VTE prophylaxis- SCDs, etc  Mobilize as tolerated to help recovery.  She has been doing well with this.  D/C patient from hospital when patient meets criteria (anticipate in 0-1 day(s)):  Tolerating oral intake well Ambulating well Adequate pain control without IV medications Urinating  Having flatus Ileostomy care and teaching done. Disposition  planning in place   30 minutes spent in review, evaluation, examination, counseling, and coordination of care.  More than 50% of that time was spent in counseling.  12/24/2017    Subjective: (Chief complaint)  Sitting up in chair.  Walking hallways.  Husband in room.  Tolerating full liquids.  Beginning to have ileostomy output.    Pain controlled.  Objective:  Vital signs:  Vitals:   12/23/17 1329 12/23/17 2103 12/24/17 0419 12/24/17 0446  BP: (!) 148/66 (!) 153/73 (!) 151/72   Pulse: 75 78 85   Resp: '17 16 15   ' Temp: 98.6 F (37 C) 99 F (37.2 C) 98.7 F (37.1 C)   TempSrc: Oral Oral Oral   SpO2: 98% 96% 97%   Weight:    109.1 kg  Height:        Last BM Date: 12/24/17  Intake/Output   Yesterday:  11/24 0701 - 11/25 0700 In: 600 [P.O.:600] Out: 0349 [Urine:1825; Drains:80; Stool:500] This shift:  No intake/output data recorded.  Bowel function:  Flatus: YES  BM:  YES  Drain: Serosanguinous   Physical Exam:  General: Pt awake/alert/oriented x4 in no acute distress Eyes: PERRL, normal EOM.  Sclera clear.  No icterus Neuro: CN II-XII intact w/o focal sensory/motor deficits. Lymph: No head/neck/groin lymphadenopathy Psych:  No delerium/psychosis/paranoia HENT: Normocephalic, Mucus membranes moist.  No thrush Neck: Supple, No tracheal deviation Chest: No chest wall pain w good excursion CV:  Pulses intact.  Regular rhythm MS: Normal AROM mjr joints.  No obvious deformity  Abdomen: Soft.  Nondistended.  Mildly tender at incisions only.  Ileostomy bag in place.  Gas and oatmeal-consistency succus in bag.  No evidence of peritonitis.  No incarcerated  hernias.  Ext:  No deformity.  No mjr edema.  No cyanosis Skin: No petechiae / purpura  Results:   Labs: Results for orders placed or performed during the hospital encounter of 12/21/17 (from the past 48 hour(s))  CBC     Status: Abnormal   Collection Time: 12/23/17  4:05 AM  Result Value Ref  Range   WBC 10.4 4.0 - 10.5 K/uL   RBC 2.83 (L) 3.87 - 5.11 MIL/uL   Hemoglobin 9.0 (L) 12.0 - 15.0 g/dL   HCT 28.3 (L) 36.0 - 46.0 %   MCV 100.0 80.0 - 100.0 fL   MCH 31.8 26.0 - 34.0 pg   MCHC 31.8 30.0 - 36.0 g/dL   RDW 13.6 11.5 - 15.5 %   Platelets 155 150 - 400 K/uL   nRBC 0.0 0.0 - 0.2 %    Comment: Performed at [facility], [place] [address], [place], [place] [address]  Basic metabolic panel     Status: Abnormal   Collection Time: 12/24/17  4:18 AM  Result Value Ref Range   Sodium 136 135 - 145 mmol/L   Potassium 4.8 3.5 - 5.1 mmol/L   Chloride 101 98 - 111 mmol/L   CO2 29 22 - 32 mmol/L   Glucose, Bld 107 (H) 70 - 99 mg/dL   BUN 16 8 - 23 mg/dL   Creatinine, Ser 0.81 0.44 - 1.00 mg/dL   Calcium 8.4 (L) 8.9 - 10.3 mg/dL   GFR calc non Af Amer >60 >60 mL/min   GFR calc Af Amer >60 >60 mL/min    Comment: (NOTE) The eGFR has been calculated using the CKD EPI equation. This calculation has not been validated in all clinical situations. eGFR's persistently <60 mL/min signify possible Chronic Kidney Disease.    Anion gap 6 5 - 15    Comment: Performed at [facility], [place] [address], [place], [place] [address]  Magnesium     Status: None   Collection Time: 12/24/17  4:18 AM  Result Value Ref Range   Magnesium 2.0 1.7 - 2.4 mg/dL    Comment: Performed at [facility], [place] [address] [place], [place] [address]    Imaging / Studies: No results found.  Medications / Allergies: per chart  Antibiotics: Anti-infectives (From admission, onward)   Start     Dose/Rate Route Frequency Ordered Stop   12/21/17 2000  cefoTEtan (CEFOTAN) 2 g in sodium chloride 0.9 % 100 mL IVPB     2 g 200 mL/hr over 30 Minutes Intravenous Every 12 hours 12/21/17 1516 12/21/17 2032   12/21/17 1400  neomycin (MYCIFRADIN) tablet 1,000 mg  Status:  Discontinued     1,000 mg Oral 3 times per day 12/21/17 0546 12/21/17 1456   12/21/17 1400   metroNIDAZOLE (FLAGYL) tablet 1,000 mg  Status:  Discontinued     1,000 mg Oral 3 times per day 12/21/17 0546 12/21/17 1456   12/21/17 1245  clindamycin (CLEOCIN) 900 mg, gentamicin (GARAMYCIN) 240 mg in sodium chloride 0.9 % 1,000 mL for intraperitoneal lavage  Status:  Discontinued       As needed 12/21/17 1245 12/21/17 1331   12/21/17 0600  clindamycin (CLEOCIN) 900 mg, gentamicin (GARAMYCIN) 240 mg in sodium chloride 0.9 % 1,000 mL for intraperitoneal lavage  Status:  Discontinued      Intraperitoneal On call to O.R. 12/20/17 0741 12/21/17 1456   12/21/17 0600  cefoTEtan (CEFOTAN) 2 g in sodium chloride 0.9 %  100 mL IVPB     2 g 200 mL/hr over 30 Minutes Intravenous On call to O.R. 12/21/17 0546 12/21/17 0755        Note: Portions of this report may have been transcribed using voice recognition software. Every effort was made to ensure accuracy; however, inadvertent computerized transcription errors may be present.   Any transcriptional errors that result from this process are unintentional.     Adin Hector, MD, FACS, MASCRS Gastrointestinal and Minimally Invasive Surgery    [address] [place], [place] [address] [phone] Main / Paging [phone] Fax

## 2017-12-24 NOTE — Addendum Note (Signed)
Addendum  created 12/24/17 0730 by Lollie Sails, CRNA   Charge Capture section accepted

## 2017-12-25 ENCOUNTER — Ambulatory Visit: Payer: Self-pay | Admitting: Surgery

## 2017-12-25 MED ORDER — TRAMADOL HCL 50 MG PO TABS: 50.0000 mg | ORAL_TABLET | Freq: Four times a day (QID) | ORAL | 0 refills | Status: DC | PRN

## 2017-12-25 NOTE — Progress Notes (Signed)
Reviewed discharge instructions with patient; copy given. IV removed. Patient ready for discharge.  

## 2017-12-25 NOTE — Discharge Summary (Signed)
Physician Discharge Summary    Patient ID: ELYSSE Lutz MRN: [id number] DOB/AGE: 04/08/1950  67 y.o.  Patient Care Team: Lance Sell, NP as PCP - General (Nurse Practitioner) Delsa Bern, MD (Obstetrics and Gynecology) Melissa Noon, [place] (Optometry) [name], MD as Consulting Physician (General Surgery) Danis, Kirke Corin, MD as Consulting Physician (Gastroenterology) Ladell Pier, MD as Consulting Physician (Oncology) Kyung Rudd, MD as Consulting Physician (Radiation Oncology)  Admit date: 12/21/2017  Discharge date: 12/25/2017  Hospital Stay = 4 days    Discharge Diagnoses:  Principal Problem:   Rectal cancer s/p robotic LAR resection & diverting loop ileostomy 12/21/2017 Active Problems:   Morbid obesity with body mass index of 40.0-49.9 (HCC)   Hypothyroid   Ileostomy in place [facility])   4 Days Post-Op  12/21/2017  POST-OPERATIVE DIAGNOSIS:  Rectal cancer  PROCEDURE:   XI ROBOTIC ASSISTED LOWER ANTERIOR RECTOSIGMOID RESECTION Evaluation of colon and rectal perfusion using firefly immunofluorescence DIVERTING LOOP ILEOSTOMY RIGID PROCTOSCOPY  SURGEON:  Adin Hector, MD  Consults: None  Hospital Course:   Samantha Lutz with bulky mid/distal rectal cancer.  Underwent neoadjuvant chemoradiation therapy.  Admitted for surgical resection.  Underwent robotic low anterior resection with protective loop ileostomy as noted above.  Postoperatively, the patient gradually mobilized and advanced to a solid diet.  Pain and other symptoms were treated aggressively.  She had ileostomy training.  Drain output remains serosanguineous with decreasing volume.  She was able to ambulate well and follow the enhanced recovery ERAS protocol  By the time of discharge, the patient was walking well the hallways, eating food, having flatus.  Pain was well-controlled on an oral medications.  Based on meeting discharge criteria and continuing to recover, I felt it  was safe for the patient to be discharged from the hospital to further recover with close followup. Postoperative recommendations were discussed in detail.  They are written as well.  Discharged Condition: good  Discharge Exam: Blood pressure (!) 157/80, pulse 78, temperature 98 F (36.7 C), temperature source Oral, resp. rate 16, height '5\' 3"'  (1.6 m), weight 104.8 kg, SpO2 97 %.  General: Pt awake/alert/oriented x4 in No acute distress Eyes: PERRL, normal EOM.  Sclera clear.  No icterus Neuro: CN II-XII intact w/o focal sensory/motor deficits. Lymph: No head/neck/groin lymphadenopathy Psych:  No delerium/psychosis/paranoia HENT: Normocephalic, Mucus membranes moist.  No thrush Neck: Supple, No tracheal deviation Chest: No chest wall pain w good excursion CV:  Pulses intact.  Regular rhythm MS: Normal AROM mjr joints.  No obvious deformity Abdomen: Soft.  Nondistended.  Nontender.  Ileostomy in place with gas and pasty succus in bag.  No evidence of peritonitis.  No incarcerated hernias. Ext:  SCDs BLE.  No mjr edema.  No cyanosis Skin: No petechiae / purpura   Disposition:   Follow-up Information    [name], MD. Schedule an appointment as soon as possible for a visit in 1 week.   Specialty:  General Surgery Why:  To follow up after your operation, To follow up after your hospital stay Contact information: [address] [place] [address] [phone]               Allergies as of 12/25/2017      Reactions   Epinephrine Palpitations   Losartan Hives      Medication List    TAKE these medications   clobetasol ointment 0.05 % Commonly known as:  TEMOVATE Apply 1 application topically daily as  needed (irritation).   levothyroxine 50 MCG tablet Commonly known as:  SYNTHROID, LEVOTHROID TAKE 1 TABLET BY MOUTH ONCE DAILY What changed:    how much to take  when to take this   traMADol 50 MG tablet Commonly known as:  ULTRAM Take 1-2  tablets (50-100 mg total) by mouth every 6 (six) hours as needed for moderate pain or severe pain.       Significant Diagnostic Studies:  Diagnosis 1. Colon, segmental resection for tumor, rectosigmoid - RESIDUAL INVASIVE ADENOCARCINOMA, WELL-DIFFERENTIATED, SPANNING 2 CM. - TUMOR INVADES THROUGH MUSCULARIS PROPRIA. - RESECTION MARGINS ARE NEGATIVE. - LYMPHOVASCULAR INVASION IDENTIFIED. 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 - SEE ONCOLOGY TABLE. 2. Colon, resection margin (donut), rectosigmoid distal ring - final margin - BENIGN COLONIC MUCOSA. - NO DYSPLASIA OR MALIGNANCY. Microscopic Comment 1. COLON AND RECTUM: Resection, Including Transanal Disk Excision of Rectal Neoplasms Procedure: Segmental resection rectosigmoid colon. Tumor Site: Middle third of rectum. Tumor Size: 2 cm. Macroscopic Tumor Perforation: Not identified. Histologic Type: Invasive adenocarcinoma. Histologic Grade: G1, well-differentiated. Tumor Extension: Through muscularis propria. Margins: Negative. Treatment Effect: Present (TRS 1). Lymphovascular Invasion: Present. Perineural Invasion: Not identified. Tumor Deposits: Not identified. Regional Lymph Nodes: Number of Lymph Nodes Involved: Micrometastasis in one node with treatment effect. Number of Lymph Nodes Examined: 23 Pathologic Stage Classification (pTNM, AJCC 8th Edition): ypT3, ypN1a Ancillary Studies: Insufficient tumor for MSI. MMR will be ordered. Representative tumor block: 1D 1 of 3 FINAL for Samantha Lutz, Samantha Lutz ([id number]) Microscopic Comment(continued) Comments: None. (v4.0.1.0) [name] MD Pathologist, Electronic Signature (Case signed 12/24/2017) Specimen Samantha Lutz and Clinical Information Specimen(s) Obtained: 1. Colon, segmental resection for tumor, rectosigmoid 2. Colon, resection margin (donut), rectosigmoid distal ring - final margin Specimen Clinical Information 1. rectal cancer  (kp) Samantha Lutz 1. Specimen: Rectosigmoid. Specimen integrity: Intact. Specimen length: 29 cm. Mesorectal intactness: Complete. Tumor location: Anterior wall of middle third of rectum. Tumor size: There is a 1 x 0.8 cm stellate centrally granular dark red mucosal umbilication with underlying indurated tissue. Percent of bowel circumference involved: 10 to 15%. Tumor distance to margins: Proximal: 24 cm. Distal: 4 cm. Radial (posterior ascending, posterior descending; lateral and posterior mid-rectum; and entire lower 1/3 rectum): 3 cm to the perirectal soft tissue margin. Macroscopic extent of tumor invasion: The wall beneath the mucosal umbilication is indurated, thickened up to 0.8 cm. No residual mass is identified. Total presumed lymph nodes: Found are twenty-six possible lymph nodes ranging from 0.1 to 0.5 cm. Extramural satellite tumor nodules: None. Mucosal polyp(s): None. Additional findings: None. Block summary: A= proximal margin. B= distal margin. C-F= stellate mucosal umbilication and underlying indurated wall entirely submitted. G= five possible nodes. H= four possible nodes. I= four possible nodes. J= four possible nodes. K= four possible nodes. L= five possible nodes. Total 12 blocks. 2. Received in formalin is a 1.1 cm in length and 1.5 cm in diameter ring of tan pink soft soft mucosa with underlying embedded metallic staples in the wall. Representative sections in one block. (SW:gt, 12/24/17) 2 of 3 FINAL for RION, SCHNITZER ([id number]) Disclaimer Some of these immunohistochemical stains may have been developed and the performance characteristics determined by [facility]. Some may not have been cleared or approved by the U.S. Food and Drug Administration. The FDA has determined that such clearance or approval is not necessary. This test is used for clinical purposes. It should not be regarded as investigational or for research. This laboratory is  certified under  the Clinical Laboratory Improvement Amendments of 1988 (CLIA-88) as qualified to perform high complexity clinical laboratory testing. Report signed out from the following location(s) Technical Component was performed at [facility]. [address],[place] [address].[id number]., Interpretation was performed at [facility] [place], [place], [place] [address]. CLIA #: [id number], 3 of  Results for orders placed or performed during the hospital encounter of 12/21/17 (from the past 72 hour(s))  CBC     Status: Abnormal   Collection Time: 12/23/17  4:05 AM  Result Value Ref Range   WBC 10.4 4.0 - 10.5 K/uL   RBC 2.83 (L) 3.87 - 5.11 MIL/uL   Hemoglobin 9.0 (L) 12.0 - 15.0 g/dL   HCT 28.3 (L) 36.0 - 46.0 %   MCV 100.0 80.0 - 100.0 fL   MCH 31.8 26.0 - 34.0 pg   MCHC 31.8 30.0 - 36.0 g/dL   RDW 13.6 11.5 - 15.5 %   Platelets 155 150 - 400 K/uL   nRBC 0.0 0.0 - 0.2 %    Comment: Performed at [facility], [place] [address] [place], [place] [address]  Basic metabolic panel     Status: Abnormal   Collection Time: 12/24/17  4:18 AM  Result Value Ref Range   Sodium 136 135 - 145 mmol/L   Potassium 4.8 3.5 - 5.1 mmol/L   Chloride 101 98 - 111 mmol/L   CO2 29 22 - 32 mmol/L   Glucose, Bld 107 (H) 70 - 99 mg/dL   BUN 16 8 - 23 mg/dL   Creatinine, Ser 0.81 0.44 - 1.00 mg/dL   Calcium 8.4 (L) 8.9 - 10.3 mg/dL   GFR calc non Af Amer >60 >60 mL/min   GFR calc Af Amer >60 >60 mL/min    Comment: (NOTE) The eGFR has been calculated using the CKD EPI equation. This calculation has not been validated in all clinical situations. eGFR's persistently <60 mL/min signify possible Chronic Kidney Disease.    Anion gap 6 5 - 15    Comment: Performed at [facility], [place] [address], [place], [place] [address]  Magnesium     Status: None   Collection Time: 12/24/17  4:18 AM  Result Value  Ref Range   Magnesium 2.0 1.7 - 2.4 mg/dL    Comment: Performed at [facility], [place] [address], [place],  [address]    Dg Chest 2 View  Result Date: 12/13/2017 CLINICAL DATA:  Acute LEFT chest pain today. EXAM: CHEST - 2 VIEW COMPARISON:  08/17/2017 chest CT FINDINGS: The cardiomediastinal silhouette is unremarkable. Mild chronic peribronchial thickening again noted. There is no evidence of focal airspace disease, pulmonary edema, suspicious pulmonary nodule/mass, pleural effusion, or pneumothorax. No acute bony abnormalities are identified. IMPRESSION: No active cardiopulmonary disease. Electronically Signed   By: Margarette Canada M.D.   On: 12/13/2017 11:34    Past Medical History:  Diagnosis Date  . Anemia   . Arthritis   . Fatty liver    ultrasound December 2012  . Hypertension    diet controlled  . Hypothyroid 01/16/2013 dx  . Irregular heartbeat    PER PATIENT REPORT; ONSET 20+YEARS AGO SAW CARDIOLOGY AT THE TIME C/O "ITS BEAT REALLY FAST FOR A MINUTE AND THEN STOPPED " DENIES ANY OTHER CARDIAC SX';  REPORTS CARDIO DID ECHO WHICH WAS NEGATIVE;  NOW COMES AND GOES ;   . Lichen sclerosus    steroids as needed  . Temporary  low platelet count (HCC)    SEE LAST LABS IN EPIC    Past Surgical History:  Procedure Laterality Date  . APPENDECTOMY  1967  . [place]  . OSTOMY N/A 12/21/2017   Procedure: DIVERTING LOOP OSTOMY;  Surgeon: [name], MD;  Location: [facility];  Service: General;  Laterality: N/A;  . PROCTOSCOPY N/A 12/21/2017   Procedure: RIGID PROCTOSCOPY;  Surgeon: [name], MD;  Location: [facility];  Service: General;  Laterality: N/A;  . XI ROBOTIC ASSISTED LOWER ANTERIOR RESECTION N/A 12/21/2017   Procedure: XI ROBOTIC ASSISTED LOWER ANTERIOR RESECTION;  Surgeon: [name], MD;  Location: [facility];  Service: General;  Laterality: N/A;    Social History   Socioeconomic History  . Marital status: Married    Spouse  name: Not on file  . Number of children: Not on file  . Years of education: Not on file  . Highest education level: Not on file  Occupational History  . Not on file  Social Needs  . Financial resource strain: Not on file  . Food insecurity:    Worry: Not on file    Inability: Not on file  . Transportation needs:    Medical: Not on file    Non-medical: Not on file  Tobacco Use  . Smoking status: Never Smoker  . Smokeless tobacco: Never Used  Substance and Sexual Activity  . Alcohol use: No  . Drug use: No  . Sexual activity: Not on file  Lifestyle  . Physical activity:    Days per week: Not on file    Minutes per session: Not on file  . Stress: Not on file  Relationships  . Social connections:    Talks on phone: Not on file    Gets together: Not on file    Attends religious service: Not on file    Active member of club or organization: Not on file    Attends meetings of clubs or organizations: Not on file    Relationship status: Not on file  . Intimate partner violence:    Fear of current or ex partner: Not on file    Emotionally abused: Not on file    Physically abused: Not on file    Forced sexual activity: Not on file  Other Topics Concern  . Not on file  Social History Narrative  . Not on file    Family History  Problem Relation Age of Onset  . Diabetes Mother   . Hypertension Mother   . COPD Mother   . Glaucoma Father 45  . Stroke Maternal Grandmother 52  . Hypertension Maternal Grandmother   . Rheum arthritis Sister 24  . Hypertension Daughter   . Breast cancer Cousin   . Colon cancer Neg Hx   . Esophageal cancer Neg Hx   . Liver cancer Neg Hx   . Pancreatic cancer Neg Hx   . Stomach cancer Neg Hx   . Rectal cancer Neg Hx     Current Facility-Administered Medications  Medication Dose Route Frequency Provider Last Rate Last Dose  . acetaminophen (TYLENOL) tablet 1,000 mg  1,000 mg Oral Lajuana Ripple, MD   1,000 mg at 12/25/17 5277  . alum &  mag hydroxide-simeth (MAALOX/MYLANTA) 200-200-20 MG/5ML suspension 30 mL  30 mL Oral Q6H PRN [name], MD      . balanced salts (BSS) ophthalmic solution 15 mL  15 mL Irrigation Once Brennan Bailey, MD      .  diphenhydrAMINE (BENADRYL) 12.5 MG/5ML elixir 12.5 mg  12.5 mg Oral Q6H PRN [name], MD       Or  . diphenhydrAMINE (BENADRYL) injection 12.5 mg  12.5 mg Intravenous Q6H PRN [name], MD      . enoxaparin (LOVENOX) injection 40 mg  40 mg Subcutaneous Q24H [name], MD   40 mg at 12/24/17 0900  . feeding supplement (ENSURE SURGERY) liquid 237 mL  237 mL Oral BID BM [name], MD   237 mL at 12/24/17 1417  . gabapentin (NEURONTIN) capsule 300 mg  300 mg Oral BID [name], MD   300 mg at 12/24/17 2125  . hydrALAZINE (APRESOLINE) injection 10 mg  10 mg Intravenous Q2H PRN [name], MD      . HYDROmorphone (DILAUDID) injection 0.5-2 mg  0.5-2 mg Intravenous Q4H PRN [name], MD   1 mg at 12/22/17 1434  . lactated ringers bolus 1,000 mL  1,000 mL Intravenous TID PRN [name], MD      . levothyroxine (SYNTHROID, LEVOTHROID) tablet 25 mcg  25 mcg Oral QAC breakfast [name], MD   25 mcg at 12/25/17 1517  . lip balm (CARMEX) ointment 1 application  1 application Topical BID [name], MD   1 application at 61/60/73 2126  . loperamide (IMODIUM) capsule 2-4 mg  2-4 mg Oral Q8H PRN [name], MD      . magic mouthwash  15 mL Oral QID PRN [name], MD      . metoCLOPramide (REGLAN) injection 10 mg  10 mg Intravenous Q6H PRN [name], MD      . metoprolol tartrate (LOPRESSOR) injection 5 mg  5 mg Intravenous Q6H PRN [name], MD   5 mg at 12/22/17 2353  . ondansetron (ZOFRAN) tablet 4 mg  4 mg Oral Q6H PRN [name], MD   4 mg at 12/23/17 1658   Or  . ondansetron (ZOFRAN) injection 4 mg  4 mg Intravenous Q6H PRN [name], MD      . prochlorperazine (COMPAZINE) tablet 10 mg  10 mg Oral Q6H PRN [name], MD       Or   . prochlorperazine (COMPAZINE) injection 5-10 mg  5-10 mg Intravenous Q6H PRN [name], MD      . psyllium (HYDROCIL/METAMUCIL) packet 1 packet  1 packet Oral BID [name], MD   1 packet at 12/24/17 2125  . saccharomyces boulardii (FLORASTOR) capsule 250 mg  250 mg Oral BID [name], MD   250 mg at 12/24/17 2125  . traMADol (ULTRAM) tablet 50-100 mg  50-100 mg Oral Q6H PRN [name], MD   50 mg at 12/24/17 1001  . vitamin C (ASCORBIC ACID) tablet 500 mg  500 mg Oral BID [name], MD   500 mg at 12/24/17 2125     Allergies  Allergen Reactions  . Epinephrine Palpitations  . Losartan Hives    Signed: Morton Peters, MD, FACS, MASCRS Gastrointestinal and Minimally Invasive Surgery    [address], [place],  [address] [phone] Main / Paging [phone] Fax   12/25/2017, 6:31 AM

## 2017-12-25 NOTE — Discharge Instructions (Signed)
SURGERY: POST OP INSTRUCTIONS (Surgery for small bowel obstruction, colon resection, etc)   ######################################################################  EAT Gradually transition to a high fiber diet with a fiber supplement over the next few days after discharge  WALK Walk an hour a day.  Control your pain to do that.    CONTROL PAIN Control pain so that you can walk, sleep, tolerate sneezing/coughing, go up/down stairs.  HAVE A BOWEL MOVEMENT DAILY Keep your bowels regular to avoid problems.  OK to try a laxative to override constipation.  OK to use an antidairrheal to slow down diarrhea.  Call if not better after 2 tries  CALL IF YOU HAVE PROBLEMS/CONCERNS Call if you are still struggling despite following these instructions. Call if you have concerns not answered by these instructions  ######################################################################   DIET Follow a light diet the first few days at home.  Start with a bland diet such as soups, liquids, starchy foods, low fat foods, etc.  If you feel full, bloated, or constipated, stay on a ful liquid or pureed/blenderized diet for a few days until you feel better and no longer constipated. Be sure to drink plenty of fluids every day to avoid getting dehydrated (feeling dizzy, not urinating, etc.). Gradually add a fiber supplement to your diet over the next week.  Gradually get back to a regular solid diet.  Avoid fast food or heavy meals the first week as you are more likely to get nauseated. It is expected for your digestive tract to need a few months to get back to normal.  It is common for your bowel movements and stools to be irregular.  You will have occasional bloating and cramping that should eventually fade away.  Until you are eating solid food normally, off all pain medications, and back to regular activities; your bowels will not be normal. Focus on eating a low-fat, high fiber diet the rest of your life  (See Getting to [place], below).  CARE of your INCISION or WOUND It is good for closed incision and even open wounds to be washed every day.  Shower every day.  Short baths are fine.  Wash the incisions and wounds clean with soap & water.    If you have a closed incision(s), wash the incision with soap & water every day.  You may leave closed incisions open to air if it is dry.   You may cover the incision with clean gauze & replace it after your daily shower for comfort. If you have skin tapes (Steristrips) or skin glue (Dermabond) on your incision, leave them in place.  They will fall off on their own like a scab.  You may trim any edges that curl up with clean scissors.  If you have staples, set up an appointment for them to be removed in the office in 10 days after surgery.  If you have a drain, wash around the skin exit site with soap & water and place a new dressing of gauze or band aid around the skin every day.  Keep the drain site clean & dry.    If you have an open wound with packing, see wound care instructions.  In general, it is encouraged that you remove your dressing and packing, shower with soap & water, and replace your dressing once a day.  Pack the wound with clean gauze moistened with normal (0.9%) saline to keep the wound moist & uninfected.  Pressure on the dressing for 30 minutes will stop most wound  bleeding.  Eventually your body will heal & pull the open wound closed over the next few months.  Raw open wounds will occasionally bleed or secrete yellow drainage until it heals closed.  Drain sites will drain a little until the drain is removed.  Even closed incisions can have mild bleeding or drainage the first few days until the skin edges scab over & seal.   If you have an open wound with a wound vac, see wound vac care instructions.     ACTIVITIES as tolerated Start light daily activities --- self-care, walking, climbing stairs-- beginning the day after surgery.   Gradually increase activities as tolerated.  Control your pain to be active.  Stop when you are tired.  Ideally, walk several times a day, eventually an hour a day.   Most people are back to most day-to-day activities in a few weeks.  It takes 4-8 weeks to get back to unrestricted, intense activity. If you can walk 30 minutes without difficulty, it is safe to try more intense activity such as jogging, treadmill, bicycling, low-impact aerobics, swimming, etc. Save the most intensive and strenuous activity for last (Usually 4-8 weeks after surgery) such as sit-ups, heavy lifting, contact sports, etc.  Refrain from any intense heavy lifting or straining until you are off narcotics for pain control.  You will have off days, but things should improve week-by-week. DO NOT PUSH THROUGH PAIN.  Let pain be your guide: If it hurts to do something, don't do it.  Pain is your body warning you to avoid that activity for another week until the pain goes down. You may drive when you are no longer taking narcotic prescription pain medication, you can comfortably wear a seatbelt, and you can safely make sudden turns/stops to protect yourself without hesitating due to pain. You may have sexual intercourse when it is comfortable. If it hurts to do something, stop.  MEDICATIONS Take your usually prescribed home medications unless otherwise directed.   Blood thinners:  Usually you can restart any strong blood thinners after the second postoperative day.  It is OK to take aspirin right away.     If you are on strong blood thinners (warfarin/Coumadin, Plavix, Xerelto, Eliquis, Pradaxa, etc), discuss with your surgeon, medicine PCP, and/or cardiologist for instructions on when to restart the blood thinner & if blood monitoring is needed (PT/INR blood check, etc).     PAIN CONTROL Pain after surgery or related to activity is often due to strain/injury to muscle, tendon, nerves and/or incisions.  This pain is usually  short-term and will improve in a few months.  To help speed the process of healing and to get back to regular activity more quickly, DO THE FOLLOWING THINGS TOGETHER: 1. Increase activity gradually.  DO NOT PUSH THROUGH PAIN 2. Use Ice and/or Heat 3. Try Gentle Massage and/or Stretching 4. Take over the counter pain medication 5. Take Narcotic prescription pain medication for more severe pain  Good pain control = faster recovery.  It is better to take more medicine to be more active than to stay in bed all day to avoid medications. 1.  Increase activity gradually Avoid heavy lifting at first, then increase to lifting as tolerated over the next 6 weeks. Do not push through the pain.  Listen to your body and avoid positions and maneuvers than reproduce the pain.  Wait a few days before trying something more intense Walking an hour a day is encouraged to help your body recover faster  and more safely.  Start slowly and stop when getting sore.  If you can walk 30 minutes without stopping or pain, you can try more intense activity (running, jogging, aerobics, cycling, swimming, treadmill, sex, sports, weightlifting, etc.) Remember: If it hurts to do it, then dont do it! 2. Use Ice and/or Heat You will have swelling and bruising around the incisions.  This will take several weeks to resolve. Ice packs or heating pads (6-8 times a day, 30-60 minutes at a time) will help sooth soreness & bruising. Some people prefer to use ice alone, heat alone, or alternate between ice & heat.  Experiment and see what works best for you.  Consider trying ice for the first few days to help decrease swelling and bruising; then, switch to heat to help relax sore spots and speed recovery. Shower every day.  Short baths are fine.  It feels good!  Keep the incisions and wounds clean with soap & water.   3. Try Gentle Massage and/or Stretching Massage at the area of pain many times a day Stop if you feel pain - do not  overdo it 4. Take over the counter pain medication This helps the muscle and nerve tissues become less irritable and calm down faster Choose ONE of the following over-the-counter anti-inflammatory medications: Acetaminophen 533m tabs (Tylenol) 1-2 pills with every meal and just before bedtime (avoid if you have liver problems or if you have acetaminophen in you narcotic prescription) Naproxen 2252mtabs (ex. Aleve, Naprosyn) 1-2 pills twice a day (avoid if you have kidney, stomach, IBD, or bleeding problems) Ibuprofen 20064mabs (ex. Advil, Motrin) 3-4 pills with every meal and just before bedtime (avoid if you have kidney, stomach, IBD, or bleeding problems) Take with food/snack several times a day as directed for at least 2 weeks to help keep pain / soreness down & more manageable. 5. Take Narcotic prescription pain medication for more severe pain A prescription for strong pain control is often given to you upon discharge (for example: oxycodone/Percocet, hydrocodone/Norco/Vicodin, or tramadol/Ultram) Take your pain medication as prescribed. Be mindful that most narcotic prescriptions contain Tylenol (acetaminophen) as well - avoid taking too much Tylenol. If you are having problems/concerns with the prescription medicine (does not control pain, nausea, vomiting, rash, itching, etc.), please call us [phone] see if we need to switch you to a different pain medicine that will work better for you and/or control your side effects better. If you need a refill on your pain medication, you must call the office before 4 pm and on weekdays only.  By federal law, prescriptions for narcotics cannot be called into a pharmacy.  They must be filled out on paper & picked up from our office by the patient or authorized caretaker.  Prescriptions cannot be filled after 4 pm nor on weekends.    WHEN TO CALL US [phone] uncontrolled or worsening pain  Fever over 101 F (38.5 C) Concerns with  the incision: Worsening pain, redness, rash/hives, swelling, bleeding, or drainage Reactions / problems with new medications (itching, rash, hives, nausea, etc.) Nausea and/or vomiting Difficulty urinating Difficulty breathing Worsening fatigue, dizziness, lightheadedness, blurred vision Other concerns If you are not getting better after two weeks or are noticing you are getting worse, contact our office [phone] for further advice.  We may need to adjust your medications, re-evaluate you in the office, send you to the emergency room, or see what other things we can do to help. The  clinic staff is available to answer your questions during regular business hours (8:30am-5pm).  Please dont hesitate to call and ask to speak to one of our nurses for clinical concerns.    A surgeon from [facility] is always on call at the hospitals 24 hours/day If you have a medical emergency, go to the nearest emergency room or call 911.  FOLLOW UP in our office One the day of your discharge from the hospital (or the next business weekday), please call [facility] to set up or confirm an appointment to see your surgeon in the office for a follow-up appointment.  Usually it is 2-3 weeks after your surgery.   If you have skin staples at your incision(s), let the office know so we can set up a time in the office for the nurse to remove them (usually around 10 days after surgery). Make sure that you call for appointments the day of discharge (or the next business weekday) from the hospital to ensure a convenient appointment time. IF YOU HAVE DISABILITY OR FAMILY LEAVE FORMS, BRING THEM TO THE OFFICE FOR PROCESSING.  DO NOT GIVE THEM TO YOUR DOCTOR.  [facility] [address] [place], [place],   [address] ? [phone] - Main [phone] - [place],  [phone] - Fax [email or web address]  Ostomy Support Information  Theresia Majors heard  that people get along just fine with only one of their eyes, or one of their lungs, or one of their kidneys. But you also know that you have only one intestine and only one bladder, and that leaves you feeling awfully empty, both physically and emotionally: You think no other people go around without part of their intestine with the ends of their intestines sticking out through their abdominal walls.   YOU ARE NOT ALONE.  There are nearly three quarters of a million people in the [place] who have an ostomy; people who have had surgery to remove all or part of their colons or bladders.   There is even a national association, the [place] [employer] [place] with over 350 local affiliated support groups that are organized by volunteers who provide peer support and counseling. [place] has a toll free telephone num-ber, [phone] and an educational, interactive website, www.ostomy.org   An ostomy is an opening in the belly (abdominal wall) made by surgery. Ostomates are people who have had this procedure. The opening (stoma) allows the kidney or bowel to discharge waste. An external pouch covers the stoma to collect waste. Pouches are are a simple bag and are odor free. Different companies have disposable or reusable pouches to fit one's lifestyle. An ostomy can either be temporary or permanent.   THERE ARE THREE MAIN TYPES OF OSTOMIES  Colostomy. A colostomy is a surgically created opening in the large intestine (colon).  Ileostomy. An ileostomy is a surgically created opening in the small intestine.  Urostomy. A urostomy is a surgically created opening to divert urine away from the bladder.  OSTOMY Care  The following guidelines will make care of your colostomy easier. Keep this information close by for quick reference.  Helpful DIET hints Eat a well-balanced diet including vegetables and fresh fruits. Eat on a regular schedule. Drink at least 6 to 8 glasses of fluids daily. Eat slowly in a  relaxed atmosphere. Chew your food thoroughly. Avoid chewing gum, smoking, and drinking from a straw. This will help decrease the amount of air you swallow, which may help  reduce gas. Eating yogurt or drinking buttermilk may help reduce gas.  To control gas at night, do not eat after 8 p.m. This will give your bowel time to quiet down before you go to bed.  If gas is a problem, you can purchase Beano. Sprinkle Beano on the first bite of food before eating to reduce gas. It has no flavor and should not change the taste of your food. You can buy Beano over the counter at your local drugstore.  Foods like fish, onions, garlic, broccoli, asparagus, and cabbage produce odor. Although your pouch is odor-proof, if you eat these foods you may notice a stronger odor when emptying your pouch. If this is a concern, you may want to limit these foods in your diet.  If you have an ileostomy, you will have chronic diarrhea & need to drink more liquids to avoid getting dehydrated.  Consider antidiarrheal medicine like imodium (loperamide) or Lomotil to help slow down bowel movements / diarrhea into your ileostomy bag.  GETTING TO GOOD BOWEL HEALTH WITH AN ILEOSTOMY . Irregular bowel habits such as constipation and diarrhea can lead to many problems over time.  The goal: 3-6 small BOWEL MOVEMENTS A DAY!  To have soft, regular bowel movements:   Drink plenty of fluids, consider 4-6 tall glasses of water a day.    Controlling diarrheA  o Switch to liquids and simpler foods for a few days to avoid stressing your intestines further. o Avoid dairy products (especially milk & ice cream) for a short time.  The intestines often can lose the ability to digest lactose when stressed. o Avoid foods that cause gassiness or bloating.  Typical foods include beans and other legumes, cabbage, broccoli, and dairy foods.  Every person has some sensitivity to other foods, so listen to our body and avoid those foods that trigger  problems for you. o Adding fiber (Citrucel, Metamucil, psyllium, Miralax) gradually can help thicken stools by absorbing excess fluid and retrain the intestines to act more normally.  Slowly increase the dose over a few weeks.  Too much fiber too soon can backfire and cause cramping & bloating. o Probiotics (such as active yogurt, Align, etc) may help repopulate the intestines and colon with normal bacteria and calm down a sensitive digestive tract.  Most studies show it to be of mild help, though, and such products can be costly. o Medicines: - Bismuth subsalicylate (ex. Kayopectate, Pepto Bismol) every 30 minutes for up to 6 doses can help control diarrhea.  Avoid if pregnant. - Loperamide (Immodium) can slow down diarrhea.  Start with two tablets ('4mg'$  total) first and then try one tablet every 6 hours.  Avoid if you are having fevers or severe pain.  If you are not better or start feeling worse, stop all medicines and call your doctor for advice o Call your doctor if you are getting worse or not better.  Sometimes further testing (cultures, endoscopy, X-ray studies, bloodwork, etc) may be needed to help diagnose and treat the cause of the diarrhea.  TROUBLESHOOTING IRREGULAR BOWELS 1) Avoid extremes of bowel movements (no bad constipation/diarrhea) 2) Miralax 17gm mixed in 8oz. water or juice-daily. May use twice a day as needed  3) Gas-x,Phazyme, etc. as needed for gas & bloating.  4) Soft,bland diet. No spicy,greasy,fried foods.  5) Prilosec (omeprazole) over-the-counter as needed  6) May hold gluten/wheat products from diet to see if symptoms improve.  7)  May try probiotics (Align, Activa, etc) to help calm  the bowels down 7) If symptoms become worse call back immediately.   Applying the pouching system To apply your pouch, follow these steps:  Place all your equipment close at hand before removing your pouch.  Wash your hands.  Stand or sit in front of a mirror. Use the position  that works best for you. Remember that you must keep the skin around the stoma wrinkle-free for a good seal.  Gently remove the used pouch (1-piece system) or the pouch and old wafer (2-piece system). Empty the pouch into the toilet. Save the closure clip to use again.  Wash the stoma itself and the skin around the stoma. Your stoma may bleed a little when being washed. This is normal. Rinse and pat dry. You may use a wash cloth or soft paper towels (like Bounty), mild soap (like Dial, Safeguard, or [place]), and water. Avoid soaps that contain perfumes or lotions.  For a new pouch (1-piece system) or a new wafer (2-piece system), measure your stoma using the stoma guide in each box of supplies.  Trace the shape of your stoma onto the back of the new pouch or the back of the new wafer. Cut out the opening. Remove the paper backing and set it aside.  Optional: Apply a skin barrier powder to surrounding skin if it is irritated (bare or weeping), and dust off the excess. Optional: Apply a skin-prep wipe (such as Skin Prep or All-Kare) to the skin around the stoma, and let it dry. Do not apply this solution if the skin is irritated (red, tender, or broken) or if you have shaved around the stoma. Optional: Apply a skin barrier paste (such as Stomahesive, Coloplast, or Premium) around the opening cut in the back of the pouch or wafer. Allow it to dry for 30 to 60 seconds.  Hold the pouch (1-piece system) or wafer (2-piece system) with the sticky side toward your body. Make sure the skin around the stoma is wrinkle-free. Center the opening on the stoma, then press firmly to your abdomen (Fig. 4). Look in the mirror to check if you are placing the pouch, or wafer, in the right position. For a 2-piece system, snap the pouch onto the wafer. Make sure it snaps into place securely.  Place your hand over the stoma and the pouch or wafer for about 30 seconds. The heat from your hand can help the pouch or wafer stick  to your skin.  Add deodorant (such as Super Banish or Nullo) to your pouch. Other options include food extracts such as vanilla oil and peppermint extract. Add about 10 drops of the deodorant to the pouch. Then apply the closure clamp. Note: Do not use toxic  chemicals or commercial cleaning agents in your pouch. These substances may harm the stoma.  Optional: For extra seal, apply tape to all 4 sides around the pouch or wafer, as if you were framing a picture. You may use any brand of medical adhesive tape. Change your pouch every 5 to 7 days. Change it immediately if a leak occurs.  Wash your hands afterwards.  If you are wearing a 2-piece system, you may use 2 new pouches per week and alternate them. Rinse the pouch with mild soap and warm water and hang it to dry for the next day. Apply the fresh pouch. Alternate the 2 pouches like this for a week. After a week, change the wafer and begin with 2 new pouches. Place the old pouches in a plastic  bag, and put them in the trash.    Tips for colostomy care  Applying Your Pouch You may stand or sit to apply your pouch.  Keep the skin where you apply the pouch wrinkle-free. If the skin around the pouch is wrinkled, the seal may break when your skin stretches.  If hair grows close to your stoma, you may trim off the hair with scissors, an electric razor, or a safety razor.  Always have a mirror nearby so you can get a better view of your stoma.  When you apply a new pouch, write the date on the adhesive tape. This will remind you of when you last changed your pouch.  Changing Your Pouch The best time to change your pouch is in the morning, before eating or drinking anything. Your stoma can function at any time, but it will function more after eating or drinking.  Emptying Your Pouch Empty your pouch when it is one-third full (of urine, stool, and/or gas). If you wait until your pouch is fuller than this, it will be more difficult to empty and  more noticeable. When you empty your pouch, either put toilet paper in the toilet bowl first, or flush the toilet while you empty the pouch. This will reduce splashing. You can empty the pouch between your legs or to one side while sitting, or while standing or stooping. If you have a 2-piece system, you can snap off the pouch to empty it. Remember that your stoma may function during this time. If you wish to rinse your pouch after you empty it, a [place] baster can be helpful. When using a baster, squirt water up into the pouch through the opening at the bottom. With a 2-piece system, you can snap off the pouch to rinse it. After rinsing  your pouch, empty it into the toilet. When rinsing your pouch at home, put a few granules of Dreft soap in the rinse water. This helps lubricate and freshen your pouch. The inside of your pouch can be sprayed with non-stick cooking oil (Pam spray). This may help reduce stool sticking to the inside of the pouch.  Bathing You may shower or bathe with your pouch on or off. Remember that your stoma may function during this time.  The materials you use to wash your stoma and the skin around it should be clean, but they do not need to be sterile.  Wearing Your Pouch During hot weather, or if you perspire a lot in general, wear a cover over your pouch. This may prevent a rash on your skin under the pouch. Pouch covers are sold at ostomy supply stores. Wear the pouch inside your underwear for better support. Watch your weight. Any gain or loss of 10 to 15 pounds or more can change the way your pouch fits.  Going Away From Home A collapsible cup (like those that come in travel kits) or a soft plastic squirt bottle with a pull-up top (like a travel bottle for shampoo) can be used for rinsing your pouch when you are away from home. Tilt the opening of the pouch at an upward angle when using a cup to rinse.  Carry wet wipes or extra tissues to use in public  bathrooms.  Carry an extra pouching system with you at all times.  Never keep ostomy supplies in the glove compartment of your car. Extreme heat or cold can damage the skin barriers and adhesive wafers on the pouch.  When you travel, carry  your ostomy supplies with you at all times. Keep them within easy reach. Do not pack ostomy supplies in baggage that will be checked or otherwise separated from you, because your baggage might be lost. If youre traveling out of the country, it is helpful to have a letter stating that you are carrying ostomy supplies as a medical necessity.  If you need ostomy supplies while traveling, look in the yellow pages of the telephone book under Surgical Supplies. Or call the local ostomy organization to find out where supplies are available.  Do not let your ostomy supplies get low. Always order new pouches before you use the last one.  Reducing Odor Limit foods such as broccoli, cabbage, onions, fish, and garlic in your diet to help reduce odor. Each time you empty your pouch, carefully clean the opening of the pouch, both inside and outside, with toilet paper. Rinse your pouch 1 or 2 times daily after you empty it (see directions for emptying your pouch and going away from home). Add deodorant (such as Super Banish or Nullo) to your pouch. Use air deodorizers in your bathroom. Do not add aspirin to your pouch. Even though aspirin can help prevent odor, it could cause ulcers on your stoma.  When to call the doctor Call the doctor if you have any of the following symptoms: Purple, black, or white stoma Severe cramps lasting more than 6 hours Severe watery discharge from the stoma lasting more than 6 hours No output from the colostomy for 3 days Excessive bleeding from your stoma Swelling of your stoma to more than 1/2-inch larger than usual Pulling inward of your stoma below skin level Severe skin irritation or deep ulcers Bulging or other changes in your  abdomen  When to call your ostomy nurse Call your ostomy/enterostomal therapy (ET) nurse if any of the following occurs: Frequent leaking of your pouching system Change in size or appearance of your stoma, causing discomfort or problems with your pouch Skin rash or rawness Weight gain or loss that causes problems with your pouch      FREQUENTLY ASKED QUESTIONS   Why havent you met any of these folks who have an ostomy?  Well, maybe you have! You just did not recognize them because an ostomy doesn't show. It can be kept secret if you wish. Why, maybe some of your best friends, office associates or neighbors have an ostomy ... you never can tell.   People facing ostomy surgery have many quality-of-life questions like:  Will you bulge? Smell? Make noises? Will you feel waste leaving your body? Will you be a captive of the toilet? Will you starve? Be a social outcast? Get/stay married? Have babies? Easily bathe, go swimming, bend over?  OK, lets look at what you can expect:   Will you bulge?  Remember, without part of the intestine or bladder, and its contents, you should have a flatter tummy than before. You can expect to wear, with little exception, what you wore before surgery ... and this in-cludes tight clothing and bathing suits.   Will you smell?  Today, thanks to modern odor proof pouching systems, you can walk into an ostomy support group meeting and not smell anything that is foul or offensive. And, for those with an ileostomy or colostomy who are concerned about odor when emptying their pouch, there are in-pouch deodorants that can be used to eliminate any waste odors that may exist.   Will you make noises?  Everyone produces gas, especially  if they are an air-swallower. But intestinal sounds that occur from time to time are no differ-ent than a gurgling tummy, and quite often your clothing will muffle any sounds.   Will you feel the waste discharges?  For those with a  colostomy or ileostomy there might be a slight pressure when waste leaves your body, but understand that the intestines have no nerve endings, so there will be no unpleasant sensations. Those with a urostomy will probably be unaware of any kidney drainage.   Will you be a captive of the toilet?  Immediately post-op you will spend more time in the bathroom than you will after your body recovers from surgery. Every person is different, but on average those with an ileostomy or urostomy may empty their pouches 4 to 6 times a day; a little  less if you have a colostomy. The average wear time between pouch system changes is 3 to 5 days and the changing process should take less than 30 minutes.   Will I need to be on a special diet? Most people return to their normal diet when they have recovered from surgery. Be sure to chew your food well, eat a well-balanced diet and drink plenty of fluids. If you experience problems with a certain food, wait a couple of weeks and try it again.  Will there be odor and noises? Pouching systems are designed to be odor-proof or odor-resistant. There are deodorants that can be used in the pouch. Medications are also available to help reduce odor. Limit gas-producing foods and carbonated beverages. You will experience less gas and fewer noises as you heal from surgery.  How much time will it take to care for my ostomy? At first, you may spend a lot of time learning about your ostomy and how to take care of it. As you become more comfortable and skilled at changing the pouching system, it will take very little time to care for it.   Will I be able to return to work? People with ostomies can perform most jobs. As soon as you have healed from surgery, you should be able to return to work. Heavy lifting (more than 10 pounds) may be discouraged.   What about intimacy? Sexual relationships and intimacy are important and fulfilling aspects of your life. They should continue  after ostomy surgery. Intimacy-related concerns should be discussed openly between you and your partner.   Can I wear regular clothing? You do not need to wear special clothing. Ostomy pouches are fairly flat and barely noticeable. Elastic undergarments will not hurt the stoma or prevent the ostomy from functioning.   Can I participate in sports? An ostomy should not limit your involvement in sports. Many people with ostomies are runners, skiers, swimmers or participate in other active lifestyles. Talk with your caregiver first before doing heavy physical activity.  Will you starve?  Not if you follow doctors orders at each stage of your post-op adjustment. There is no such thing as an ostomy diet. Some people with an ostomy will be able to eat and tolerate anything; others may find diffi-culty with some foods. Each person is an individual and must determine, by trial, what is best for them. A good practice for all is to drink plenty of water.   Will you be a social outcast?  Have you met anyone who has an ostomy and is a social outcast? Why should you be the first? Only your attitude and self image will effect how you are  treated. No confi-dent person is an Occupational psychologist.     PROFESSIONAL HELP  Resources are available if you need help or have questions about your ostomy.    Specially trained nurses called Wound, Ostomy Continence Nurses (WOCN) are available for consultation in most major medical centers.   Consider getting an ostomy consult at one of the outpatient ostomy clinics.  [facility] one in [place] at this time   The [employer] (UOA) is a group made up of many local chapters throughout the [place]. These local groups hold meetings and provide support to prospective and existing ostomates. They sponsor educational events and have qualified visitors to make personal or telephone visits. Contact the UOA for the chapter nearest you and for other educational  publications.   More detailed information can be found in Colostomy Guide, a publication of the [employer] (UOA). Contact UOA at 1-787-610-4980 or visit their web site at [email or web address].[email or web address]/. The website contains links to other sites, suppliers and resources.   Tree [facility] Start Services:  Start at the website to enlist for support.  Your Wound Ostomy (WOCN) nurse may have started this process. https://www.hollister.com/en/securestart  Secure Start services are designed to support people as they live their lives with an ostomy or neurogenic bladder. Enrolling is easy and at no cost to the patient. We realize that each person's needs and life journey are different. Through Secure Start services, we want to help people live their life, their way.   DRAIN CARE:   You have a closed bulb drain to help you heal.    A bulb drain is a small, plastic reservoir which creates a gentle suction. It is used to remove excess fluid from a surgical wound. The color and amount of fluid will vary. Immediately after surgery, the fluid is bright red. It may gradually change to a yellow color. When the amount decreases to about 1 or 2 tablespoons (15 to 30 cc) per 24 hours, your caregiver will usually remove it.  JP Care  The Jackson-Pratt drainage system has flexible tubing attached to a soft, plastic bulb with a stopper. The drainage end of the tubing, which is flat and white, goes into your body through a small opening near your incision (surgical cut). A stitch holds the drainage end in place. The rest of the tube is outside your body, attached to the bulb. When the bulb is compressed with the stopper in place, it creates a vacuum. This causes a constant gentle suction, which helps draw out fluid that collects under your incision. The bulb should be compressed at all times, except when you are emptying the drainage.  How long you will have your Jackson-Pratt depends on your surgery and the amount  of fluid is draining. This is different for everyone. The Jackson-Pratt is usually removed when the drainage is 30 mL or less over 24 hours. To keep track of how much drainage youre having, you will record the amount in a drainage log. Its important to bring the log with you to your follow-up appointments.  Caring for Your Jackson-Pratt at Home In order to care for your Jackson-Pratt at home, you or your caregiver will do the following:  Empty the drain once a day and record the color and amount of drainage  Care for the area where the tubing enters your skin by washing with soap and water.  Milk the tubing to help move clots into the bulb.  Do this before you empty and measure  your drainage. Look in the mirror at the tubing. This will help you see where your hands need to be. Pinch the tubing close to where it goes into your skin between your thumb and forefinger. With the thumb and forefinger of your other hand, pinch the tubing right below your other fingers. Keep your fingers pinched and slide them down the tubing, pushing any clots down toward the bulb. You may want to use alcohol swabs to help you slide your fingers down the tubing. Repeat steps 3 and 4 as necessary to push clots from the tubing into the bulb. If you are not able to move a clot into the bulb, call your doctors office. The fluid may leak around the insertion site if a clot is blocking the drainage flow. If there is fluid in the bulb and no leakage at the insertion site, the drain is working.  How to Empty Your Jackson-Pratt and Record the Drainage You will need to empty your Jackson-Pratt every day  Gather the following supplies:  Measuring container your nurse gave you Jackson-Pratt Drainage Record  Pen or pencil  Instructions Clean an area to work on. Clean your hands thoroughly. Unplug the stopper on top of your Jackson-Pratt. This will cause the bulb to expand. Do not touch the inside of the stopper or the  inner area of the opening on the bulb. Turn your Jackson-Pratt upside down, gently squeeze the bulb, and pour the drainage into the measuring container. Turn your Jackson-Pratt right side up. Squeeze the bulb until your fingers feel the palm of your hand. Keep squeezing the bulb while you replug the stopper. Make sure the bulb stays fully compressed to ensure constant, gentle suction.    Check the amount and color of drainage in the measuring container. The first couple days after surgery the fluid may be dark red. This is normal. As you heal the fluid may look pink or pale yellow. Record this amount and the color of drainage on your Jackson-Pratt Drainage Record. Flush the drainage down the toilet and rinse the measuring container with water.  Caring for the Insertion Site  Once you have emptied the drainage, clean your hands again. Check the area around the insertion site. Look for tenderness, swelling, or pus. If you have any of these, or if you have a temperature of 101 F (38.3 C) or higher, you may have an infection. Call your doctors office.  Sometimes, the drain causes redness the size of a dime at your insertion site. This is normal. Your healthcare provider will tell you if you should place a bandage over the insertion site.  Wash drain site with soap & water (dilute hydrogen peroxide PRN) daily & replace clean dressing / tape    DAILY CARE  Keep the bulb compressed at all times, except while emptying it. The compression creates suction.   Keep sites where the tubes enter the skin dry and covered with a light bandage (dressing).   Tape the tubes to your skin, 1 to 2 inches below the insertion sites, to keep from pulling on your stitches. Tubes are stitched in place and will not slip out.   Pin the bulb to your shirt (not to your pants) with a safety pin.   For the first few days after surgery, there usually is more fluid in the bulb. Empty the bulb whenever it becomes  half full because the bulb does not create enough suction if it is too full. Include this amount in  your 24 hour totals.   When the amount of drainage decreases, empty the bulb at the same time every day. Write down the amounts and the 24 hour totals. Your caregiver will want to know them. This helps your caregiver know when the tubes can be removed.   (We anticipate removing the drain in 1-3 weeks, depending on when the output is <51m a day for 2+ days)  If there is drainage around the tube sites, change dressings and keep the area dry. If you see a clot in the tube, leave it alone. However, if the tube does not appear to be draining, let your caregiver know.  TO EMPTY THE BULB  Open the stopper to release suction.   Holding the stopper out of the way, pour drainage into the measuring cup that was sent home with you.   Measure and write down the amount. If there are 2 bulbs, note the amount of drainage from bulb 1 or bulb 2 and keep the totals separate. Your caregiver will want to know which tube is draining more.   Compress the bulb by folding it in half.   Replace the stopper.   Check the tape that holds the tube to your skin, and pin the bulb to your shirt.  SEEK MEDICAL CARE IF:  The drainage develops a bad odor.   You have an oral temperature above 102 F (38.9 C).   The amount of drainage from your wound suddenly increases or decreases.   You accidentally pull out your drain.   You have any other questions or concerns.  MAKE SURE YOU:   Understand these instructions.   Will watch your condition.   Will get help right away if you are not doing well or get worse.     Call our office if you have any questions about your drain. [phone]  Controlling diarrhea Try drinking liquids and eating bland foods for a few days to avoid stressing your intestines further. Avoid dairy products (especially milk & ice cream) for a short time.  The intestines often can lose  the ability to digest lactose when stressed. Avoid foods that cause gassiness or bloating.  Typical foods include beans and other legumes, cabbage, broccoli, and dairy foods.  Avoid greasy, spicy, fast foods.  Every person has some sensitivity to other foods, so listen to your body and avoid those foods that trigger problems for you. Probiotics (such as active yogurt, Align, etc) may help repopulate the intestines and colon with normal bacteria and calm down a sensitive digestive tract Adding a fiber supplement gradually can help thicken stools by absorbing excess fluid and retrain the intestines to act more normally.  Slowly increase the dose over a few weeks.  Too much fiber too soon can backfire and cause cramping & bloating. It is okay to try and slow down diarrhea with a few doses of antidiarrheal medicines.   Bismuth subsalicylate (ex. Kayopectate, Pepto Bismol) for a few doses can help control diarrhea.  Avoid if pregnant.   Loperamide (Imodium) can slow down diarrhea.  Start with one tablet ('2mg'$ ) first.  Avoid if you are having fevers or severe pain.  ILEOSTOMY PATIENTS WILL HAVE CHRONIC DIARRHEA since their colon is not in use.    Drink plenty of liquids.  You will need to drink even more glasses of water/liquid a day to avoid getting dehydrated. Record output from your ileostomy.  Expect to empty the bag every 3-4 hours at first.  Most people  with a permanent ileostomy empty their bag 4-6 times at the least.   Use antidiarrheal medicine (especially Imodium) several times a day to avoid getting dehydrated.  Start with a dose at bedtime & breakfast.  Adjust up or down as needed.  Increase antidiarrheal medications as directed to avoid emptying the bag more than 8 times a day (every 3 hours). Work with your wound ostomy nurse to learn care for your ostomy.  See ostomy care instructions. TROUBLESHOOTING IRREGULAR BOWELS 1) Start with a soft & bland diet. No spicy, greasy, or fried foods.  2)  Avoid gluten/wheat or dairy products from diet to see if symptoms improve. 3) Miralax 17gm or flax seed mixed in [place]. water or juice-daily. May use 2-4 times a day as needed. 4) Gas-X, Phazyme, etc. as needed for gas & bloating.  5) Prilosec (omeprazole) over-the-counter as needed 6)  Consider probiotics (Align, Activa, etc) to help calm the bowels down  Call your doctor if you are getting worse or not getting better.  Sometimes further testing (cultures, endoscopy, X-ray studies, CT scans, bloodwork, etc.) may be needed to help diagnose and treat the cause of the diarrhea. [facility], [address] [place], [place], [place], [place]  [id number] [phone] - Main.    [phone]  - Toll Free.   [phone] - Fax [email or web address]   Colorectal Cancer Colorectal cancer is an abnormal growth of cells and tissue (tumor) in the colon or rectum, which are parts of the large intestine. The cancer can spread (metastasize) to other parts of the body. What are the causes? Most cases of colorectal cancer start as abnormal growths called polyps on the inner wall of the colon or rectum. Other times, abnormal changes to genes (genetic mutations) can cause cells to form cancer. What increases the risk? You are more likely to develop this condition if:  You are older than age 49.  You have multiple polyps in the colon or rectum.  You have diabetes.  You are African American.  You have a family history of Lynch syndrome.  You have had cancer before.  You have certain hereditary conditions, such as: ? Familial adenomatous polyposis. ? Turcot syndrome. ? Peutz-Jeghers syndrome.  You eat a diet that is high in fat (especially animal fat) and low in fiber, fruits, and vegetables.  You have an inactive (sedentary) lifestyle.  You have an inflammatory bowel disease or Crohn disease.  You smoke.  You drink alcohol excessively.  What are the signs or  symptoms? Early colorectal cancer often does not cause symptoms. As the cancer grows, symptoms may include:  Changes in bowel habits.  Feeling like the bowel does not empty completely after a bowel movement.  Stools that are narrower than usual.  Blood in the stool.  Diarrhea.  Constipation.  Anemia.  Discomfort, pain, bloating, fullness, or cramps in the abdomen.  Frequent gas pain.  Unexplained weight loss.  Constant fatigue.  Nausea and vomiting.  How is this diagnosed? This condition may be diagnosed with:  A medical history.  A physical exam.  Tests. These may include: ? A digital rectal exam. ? A stool test called a fecal occult blood test. ? Blood tests. ? A test in which a tissue sample is taken from the colon or rectum and examined under a microscope (biopsy). ? Imaging tests, such as:  X-rays.  A barium enema.  CT scans.  MRIs.  A sigmoidoscopy. This test is done to view the inside  of the rectum.  A colonoscopy. This test is done to view the inside of the colon. During this test, small polyps can be removed or biopsies may be taken.  An endorectal ultrasound. This test checks how deep a tumor in the rectum has grown and whether the cancer has spread to lymph nodes or other nearby tissues.  Your health care provider may order additional tests to find out whether the cancer has spread to other parts of the body (what stage it is). The stages of cancer include:  Stage 0. At this stage, the cancer is found only in the innermost lining of the colon or rectum.  Stage I. At this stage, the cancer has grown into the inner wall of the colon or rectum.  Stage II. At this stage, the cancer has gone more deeply into the wall of the colon or rectum or through the wall. It may have invaded nearby tissue.  Stage III. At this stage, the cancer has spread to nearby lymph nodes.  Stage IV. At this stage, the cancer has spread to other parts of the body, such  as the liver or lungs.  How is this treated? Treatment for this condition depends on the type and stage of the cancer. Treatment may include:  Surgery. In the early stages of the cancer, surgery may be done to remove polyps or small tumors from the colon. In later stages, surgery may be done to remove part of the colon.  Chemotherapy. This treatment uses medicines to kill cancer cells.  Targeted therapy. This treatment targets specific gene mutations or proteins that the cancer expresses in order to kill tumor cells.  Radiation therapy. This treatment uses radiation to kill cancer cells or shrink tumors.  Radiofrequency ablation. This treatment uses radio waves to destroy the tumors that may have spread to other areas of the body, such as the liver.  Follow these instructions at home:  Take over-the-counter and prescription medicines only as told by your health care provider.  Try to eat regular, healthy meals. Some of your treatments might affect your appetite. If you are having problems eating or with your appetite, ask to meet with a food and nutrition specialist (dietitian).  Consider joining a support group. This may help you learn about your diagnosis and cope with the stress of having colorectal cancer.  If you are admitted to the hospital, inform your cancer care team.  Keep all follow-up visits as told by your health care provider. This is important. How is this prevented?  Colorectal cancer can be prevented with screening tests that find polyps so they can be removed before they develop into cancer.  Most people with average risk of colorectal cancer should start screening at age 3. People at increased risk should start screening at an earlier age. Where to find more information:  American Cancer Society: https://www.cancer.[place]): https://www.cancer.gov Contact a health care provider if:  Your diarrhea or constipation does not go  away.  You have blood in your stool.  Your bowel habits change.  You have increased pain in your abdomen.  You notice new fatigue or weakness.  You lose weight. Get help right away if:  You have increased bleeding from your rectum.  You have any uncontrollable or severe abdomen (abdominal) symptoms. Summary  Colorectal cancer is an abnormal growth of cells and tissue (tumor) in the colon or rectum.  Common risk factors for this condition include having a relative with colon cancer,  being older in age, having an inflammatory bowel disease, and being African American.  This condition may be diagnosed with tests, such as a colonoscopy and biopsy.  Treatment depends on the type and stage of the cancer. Commonly, treatment includes surgery to remove the tumor along with chemotherapy or targeted therapy.  Keep all follow-up visits as told by your health care provider. This is important. This information is not intended to replace advice given to you by your health care provider. Make sure you discuss any questions you have with your health care provider. Document Released: 01/16/2005 Document Revised: 02/18/2016 Document Reviewed: 02/18/2016 Elsevier Interactive Patient Education  [employer].

## 2017-12-25 NOTE — Care Management Note (Signed)
Case Management Note  Patient Details  Name: Samantha Lutz MRN: [id number] Date of Birth: Oct 15, 1950  Subjective/Objective:    Home health prearranged in [place] office with [facility].                 Action/Plan: Contacted [facility] for planned d/c today  Expected Discharge Date:  12/25/17               Expected Discharge Plan:  [place]  In-House Referral:  NA  Discharge planning Services  CM Consult  Post Acute Care Choice:  Home Health Choice offered to:  Patient  DME Arranged:  N/A DME Agency:  NA  HH Arranged:  RN, Disease Management HH Agency:  [facility]  Status of Service:  Completed, signed off  If discussed at [place] of Stay Meetings, dates discussed:    Additional Comments:  Guadalupe Maple, RN 12/25/2017, 9:43 AM

## 2018-01-01 ENCOUNTER — Encounter (HOSPITAL_COMMUNITY): Payer: Self-pay | Admitting: Anesthesiology

## 2018-01-01 NOTE — Progress Notes (Signed)
Pt stated that she wants to cancel procedure tomorrow because she wants to consult with her PCP " this was all thrown on me so quickly. " Pt made aware that MD will answer any questions the DOS . Pt adamant on canceling the procedure. Spoke with Denese Killings in [place] regarding pt request to cancel. Rodena Piety will reach out to MD on call to make surgeon is aware of cancellation. Rodena Piety also provided with pt contact number for f/u if needed.

## 2018-01-01 NOTE — Anesthesia Preprocedure Evaluation (Deleted)
Anesthesia Evaluation    Reviewed: Allergy & Precautions, Patient's Chart, lab work & pertinent test results  History of Anesthesia Complications Negative for: history of anesthetic complications  Airway        Dental   Pulmonary neg pulmonary ROS,    breath sounds clear to auscultation       Cardiovascular hypertension,      Neuro/Psych negative neurological ROS     GI/Hepatic Neg liver ROS, Rectal ca   Endo/Other  Hypothyroidism Morbid obesity  Renal/GU negative Renal ROS     Musculoskeletal  (+) Arthritis , Osteoarthritis,    Abdominal (+) - obese,   Peds  Hematology negative hematology ROS (+)   Anesthesia Other Findings Day of surgery medications reviewed with the patient.  Reproductive/Obstetrics                             Anesthesia Physical  Anesthesia Plan  ASA: III  Anesthesia Plan: General   Post-op Pain Management:    Induction: Intravenous  PONV Risk Score and Plan: 3 and Treatment may vary due to age or medical condition, Ondansetron, Dexamethasone and Diphenhydramine  Airway Management Planned: LMA  Additional Equipment:   Intra-op Plan:   Post-operative Plan: Extubation in OR  Informed Consent:   Plan Discussed with:   Anesthesia Plan Comments:         Anesthesia Quick Evaluation

## 2018-01-02 ENCOUNTER — Encounter (HOSPITAL_COMMUNITY): Admission: RE | Payer: Self-pay | Source: Ambulatory Visit

## 2018-01-02 ENCOUNTER — Ambulatory Visit (HOSPITAL_COMMUNITY): Admission: RE | Admit: 2018-01-02 | Payer: Medicare Other | Source: Ambulatory Visit | Admitting: General Surgery

## 2018-01-02 SURGERY — INSERTION, TUNNELED CENTRAL VENOUS DEVICE, WITH PORT
Anesthesia: General

## 2018-01-02 MED ORDER — FENTANYL CITRATE (PF) 250 MCG/5ML IJ SOLN
INTRAMUSCULAR | Status: AC
  Filled 2018-01-02: qty 5

## 2018-01-02 MED ORDER — MIDAZOLAM HCL 2 MG/2ML IJ SOLN
INTRAMUSCULAR | Status: AC
  Filled 2018-01-02: qty 2

## 2018-01-02 MED ORDER — PROPOFOL 10 MG/ML IV BOLUS
INTRAVENOUS | Status: AC
  Filled 2018-01-02: qty 20

## 2018-01-07 ENCOUNTER — Inpatient Hospital Stay: Payer: Medicare Other | Attending: Oncology | Admitting: Nurse Practitioner

## 2018-01-07 DIAGNOSIS — Z932 Ileostomy status: Secondary | ICD-10-CM | POA: Insufficient documentation

## 2018-01-07 DIAGNOSIS — C20 Malignant neoplasm of rectum: Secondary | ICD-10-CM | POA: Insufficient documentation

## 2018-01-07 DIAGNOSIS — E039 Hypothyroidism, unspecified: Secondary | ICD-10-CM | POA: Insufficient documentation

## 2018-01-07 DIAGNOSIS — Z923 Personal history of irradiation: Secondary | ICD-10-CM | POA: Insufficient documentation

## 2018-01-07 DIAGNOSIS — T451X5S Adverse effect of antineoplastic and immunosuppressive drugs, sequela: Secondary | ICD-10-CM | POA: Insufficient documentation

## 2018-01-07 DIAGNOSIS — Z9221 Personal history of antineoplastic chemotherapy: Secondary | ICD-10-CM | POA: Insufficient documentation

## 2018-01-07 DIAGNOSIS — D6959 Other secondary thrombocytopenia: Secondary | ICD-10-CM | POA: Insufficient documentation

## 2018-01-08 ENCOUNTER — Telehealth: Payer: Self-pay | Admitting: Emergency Medicine

## 2018-01-08 ENCOUNTER — Telehealth: Payer: Self-pay | Admitting: Oncology

## 2018-01-08 NOTE — Telephone Encounter (Signed)
Called patient per 12/10 sch message - unable to reach patient- left message for patient to call back to r/s

## 2018-01-08 NOTE — Telephone Encounter (Addendum)
Scheduling message sent for patient to be rescheduled asap.   ----- Message from Illene Regulus sent at 01/08/2018  2:15 PM EST ----- Hello, We saw this pt in our office today for her first po appt w/Dr [name] after sx. We talked to the pt today about needing to see Dr Benay Spice to discuss chemo after sx. The pt is fine with you making her another appt to see Dr Benay Spice. If you will please call her to give her the appt info. The patient has been having a lot of trouble after surgery with her ostomy leaking to where she feels like she can't leave the house. We are working w/her on trying to sort the ostomy issues out. The patient knows she needs to get started on chemo but she wants to talk w/Dr Benay Spice first before making any plans. Thanks, Illene Regulus, CMA w/Dr [name]

## 2018-01-08 NOTE — Telephone Encounter (Signed)
Called patient per Dr.Sherrills request to follow up. Patient missed appt yesterday and canceled port placement for last week. She does need to be rescheduled to talk about treatment.

## 2018-01-09 ENCOUNTER — Telehealth: Payer: Self-pay | Admitting: Nurse Practitioner

## 2018-01-09 NOTE — Telephone Encounter (Signed)
Copied from [place] [id number]. Topic: Quick Communication - Home Health Verbal Orders >> Jan 09, 2018  2:13 PM Cecelia Byars, [place] wrote: Caller/Agency: Caryl Pina / [facility] Callback Number:  [phone]  ok to leave a message  Requesting  Skilled Nursing/  Frequency:   Patient states she had a fever earlier  in the week , does not currently have a fever ,and also complaining of full bladder sensation ,without completely emptying her bladder not as much urine as she expects .

## 2018-01-10 NOTE — Telephone Encounter (Signed)
Left message to call back. Need clarification as to what is being requested for patient. Below message is unclear.

## 2018-01-15 ENCOUNTER — Telehealth: Payer: Self-pay | Admitting: *Deleted

## 2018-01-15 NOTE — Telephone Encounter (Signed)
Husband had called requesting an appointment for his wife. Called back and spoke with patient and she agreed to come on 12/19 at 0830.

## 2018-01-17 ENCOUNTER — Telehealth: Payer: Self-pay | Admitting: Oncology

## 2018-01-17 ENCOUNTER — Inpatient Hospital Stay (HOSPITAL_BASED_OUTPATIENT_CLINIC_OR_DEPARTMENT_OTHER): Payer: Medicare Other | Admitting: Oncology

## 2018-01-17 ENCOUNTER — Inpatient Hospital Stay: Payer: Medicare Other

## 2018-01-17 VITALS — BP 155/73 | HR 107 | Temp 98.6°F | Resp 18 | Ht 63.0 in | Wt 221.0 lb

## 2018-01-17 DIAGNOSIS — C20 Malignant neoplasm of rectum: Secondary | ICD-10-CM

## 2018-01-17 DIAGNOSIS — E039 Hypothyroidism, unspecified: Secondary | ICD-10-CM

## 2018-01-17 DIAGNOSIS — T451X5S Adverse effect of antineoplastic and immunosuppressive drugs, sequela: Secondary | ICD-10-CM | POA: Diagnosis not present

## 2018-01-17 DIAGNOSIS — Z923 Personal history of irradiation: Secondary | ICD-10-CM

## 2018-01-17 DIAGNOSIS — D6959 Other secondary thrombocytopenia: Secondary | ICD-10-CM

## 2018-01-17 DIAGNOSIS — Z932 Ileostomy status: Secondary | ICD-10-CM

## 2018-01-17 DIAGNOSIS — Z9221 Personal history of antineoplastic chemotherapy: Secondary | ICD-10-CM | POA: Diagnosis not present

## 2018-01-17 LAB — CBC WITH DIFFERENTIAL (CANCER CENTER ONLY)
Abs Immature Granulocytes: 0.13 10*3/uL — ABNORMAL HIGH (ref 0.00–0.07)
Basophils Absolute: 0 10*3/uL (ref 0.0–0.1)
Basophils Relative: 0 %
Eosinophils Absolute: 0.2 10*3/uL (ref 0.0–0.5)
Eosinophils Relative: 2 %
HCT: 29.2 % — ABNORMAL LOW (ref 36.0–46.0)
Hemoglobin: 9.6 g/dL — ABNORMAL LOW (ref 12.0–15.0)
Immature Granulocytes: 2 %
Lymphocytes Relative: 7 %
Lymphs Abs: 0.6 10*3/uL — ABNORMAL LOW (ref 0.7–4.0)
MCH: 30.6 pg (ref 26.0–34.0)
MCHC: 32.9 g/dL (ref 30.0–36.0)
MCV: 93 fL (ref 80.0–100.0)
Monocytes Absolute: 0.9 10*3/uL (ref 0.1–1.0)
Monocytes Relative: 10 %
Neutro Abs: 6.8 10*3/uL (ref 1.7–7.7)
Neutrophils Relative %: 79 %
Platelet Count: 336 10*3/uL (ref 150–400)
RBC: 3.14 MIL/uL — ABNORMAL LOW (ref 3.87–5.11)
RDW: 12.8 % (ref 11.5–15.5)
WBC Count: 8.6 10*3/uL (ref 4.0–10.5)
nRBC: 0 % (ref 0.0–0.2)

## 2018-01-17 LAB — CMP (CANCER CENTER ONLY)
ALT: 20 U/L (ref 0–44)
AST: 37 U/L (ref 15–41)
Albumin: 2.6 g/dL — ABNORMAL LOW (ref 3.5–5.0)
Alkaline Phosphatase: 72 U/L (ref 38–126)
Anion gap: 10 (ref 5–15)
BUN: 10 mg/dL (ref 8–23)
CO2: 24 mmol/L (ref 22–32)
Calcium: 9.3 mg/dL (ref 8.9–10.3)
Chloride: 105 mmol/L (ref 98–111)
Creatinine: 0.85 mg/dL (ref 0.44–1.00)
GFR, Est AFR Am: >60 mL/min (ref >60)
GFR, Estimated: >60 mL/min (ref >60)
Glucose, Bld: 99 mg/dL (ref 70–99)
Potassium: 5 mmol/L (ref 3.5–5.1)
Sodium: 139 mmol/L (ref 135–145)
Total Bilirubin: 0.5 mg/dL (ref 0.3–1.2)
Total Protein: 7.2 g/dL (ref 6.5–8.1)

## 2018-01-17 NOTE — Progress Notes (Signed)
[place] OFFICE PROGRESS NOTE   Diagnosis: Rectal cancer  INTERVAL HISTORY:   Samantha Lutz underwent a robotic assisted low anterior resection and diverting ileostomy by Dr. Johney Maine on 12/21/2017.  There was circumferential scarring in the mid rectum with surrounding tattoo.  The area was resected.  No evidence of metastatic disease.  The anastomosis is at 4-5 cm from the anal verge. The pathology ([id number]) revealed residual invasive adenocarcinoma invading through the muscularis propria.  The resection margins are negative.  Lymphovascular invasion is identified.  Metastatic carcinoma in 1 of 23 lymph nodes.  No tumor deposits.  No loss of mismatch repair protein expression.  There was insufficient tumor for MSI testing.  She reports emptying the ileostomy approximately 3 times per day.  She has difficulty with leakage surrounding the ostomy appliance.  She is being followed by ostomy nurses.  She is using Imodium and Metamucil.  Objective:  Vital signs in last 24 hours:  Blood pressure (!) 155/73, pulse (!) 107, temperature 98.6 F (37 C), temperature source Oral, resp. rate 18, height '5\' 3"'  (1.6 m), weight 221 lb (100.2 kg), SpO2 100 %.   Resp: Lungs clear bilaterally Cardio: Regular rate and rhythm GI: No hepatomegaly, right mid abdomen ileostomy, healed surgical incisions Vascular: No leg edema   Lab Results:  Lab Results  Component Value Date   WBC 8.6 01/17/2018   HGB 9.6 (L) 01/17/2018   HCT 29.2 (L) 01/17/2018   MCV 93.0 01/17/2018   PLT 336 01/17/2018   NEUTROABS 6.8 01/17/2018    CMP  Lab Results  Component Value Date   NA 139 01/17/2018   K 5.0 01/17/2018   CL 105 01/17/2018   CO2 24 01/17/2018   GLUCOSE 99 01/17/2018   BUN 10 01/17/2018   CREATININE 0.85 01/17/2018   CALCIUM 9.3 01/17/2018   PROT 7.2 01/17/2018   ALBUMIN 2.6 (L) 01/17/2018   AST 37 01/17/2018   ALT 20 01/17/2018   ALKPHOS 72 01/17/2018   BILITOT 0.5 01/17/2018   GFRNONAA >60 01/17/2018   GFRAA >60 01/17/2018    Medications: I have reviewed the patient's current medications.   Assessment/Plan:  1. Rectal cancer ? Mass at 7 cm from the anal verge on colonoscopy 08/09/2017, biopsy revealed invasive adenocarcinoma ? Staging CTs 08/17/2017-no evidence of metastatic disease, asymmetric thickening in the mid rectum ? MR pelvis 09/01/2017, T3N0 lesion beginning at 6.3 cm from the anal sphincter ? Radiation/Xeloda initiated 09/17/2017, completed 10/25/2017 ? 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  Lymphovascular invasion present, intact mismatch repair protein expression, treatment effect present (TRS 1)  2.Hypothyroid  3.   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  She has recovered from the low anterior resection and ileostomy procedure.  She appears to be tolerating the ileostomy well.  I viewed the details of the surgical pathology report and reviewed adjuvant treatment options with Samantha Lutz and her family.  She has a significant chance of developing recurrent rectal cancer over the next few years.  I explained data supporting the use of adjuvant systemic therapy in this setting.  We discussed single agent capecitabine and FOLFOX therapy.  We reviewed potential toxicities associated with each regimen.  We discussed the expected decrease in the relapse rate associated with 5-fluorouracil and oxaliplatin.  Samantha Lutz indicated she does not wish to receive oxaliplatin.  She agrees to a course of adjuvant capecitabine.  We  reviewed potential toxicities including the chance for mucositis, diarrhea, and hand/foot syndrome.  The plan is to initiate adjuvant systemic therapy on 01/21/2018.  30 minutes were spent with the patient today.  The majority of the time was used for counseling and coordination of  care.  Betsy Coder, MD  01/17/2018  3:52 PM

## 2018-01-17 NOTE — Telephone Encounter (Signed)
Printed calendar and avs. °

## 2018-01-18 ENCOUNTER — Telehealth: Payer: Self-pay | Admitting: Pharmacist

## 2018-01-18 DIAGNOSIS — C20 Malignant neoplasm of rectum: Secondary | ICD-10-CM

## 2018-01-18 MED ORDER — CAPECITABINE 500 MG PO TABS: ORAL_TABLET | ORAL | 0 refills | Status: DC

## 2018-01-18 MED FILL — CAPECITABINE 500 MG TABS: 500 | 21 days supply | Qty: 98 | Fill #0

## 2018-01-18 NOTE — Telephone Encounter (Signed)
Oral Chemotherapy Pharmacist Encounter   I spoke with patient's husband, Juanda Crumble, for overview of: Xeloda (capecitabine) for the adjuvant treatment of stage III rectal cancer, planned duration up to 6 months of Xeloda (8 planned cycles)..  Counseled on administration, dosing, side effects, monitoring, drug-food interactions, safe handling, storage, and disposal.  Patient will take Xeloda 500mg  tablets, 4 tablets (2000mg ) by mouth in AM and 3 tabs (1500mg ) by mouth in PM, within 30 minutes of finishing meals, on days 1-14 of each 21 day cycle.   Xeloda start date: 01/21/18  Adverse effects include but are not limited to: fatigue, decreased blood counts, GI upset, diarrhea, mouth sores, and hand-foot syndrome.  They have anti-emetic on hand and know to take it if nausea develops.   Patient is using lop[eramide and metamucil to control ileostomy output and will continue to manage output in this manner.  Reviewed importance of keeping a medication schedule and plan for any missed doses.  Mr. Guarino voiced understanding and appreciation.   All questions answered. Medication reconciliation performed and medication/allergy list updated.  They will pick-up the 1st fill of Xeloda from the [facility] today for copayment ~$75.00 Mr. Ferrick informed the pharmacy will call ~1 week prior to needing next Xeloda fill to coordinate continued medication acquisition.  Mr. Spikes unable to take down my number while we were on the phone. I attempted to call him back and leave it on his voicemail, however, his voicemail was full.  They know to call the office with questions or concerns. Oral Oncology Clinic will continue to follow.  Johny Drilling, PharmD, BCPS, BCOP  01/18/2018   1:51 PM Oral Oncology Clinic [phone]

## 2018-01-18 NOTE — Telephone Encounter (Signed)
Oral Oncology Pharmacist Encounter  Received new prescription for Xeloda (capecitabine) for the adjuvant treatment of stage III rectal cancer, planned duration 6 months of Xeloda (8 planned cycles).  Patient previously received Xeloda with concurrent radiation (09/17/17-10/25/17) during neoadjuvant treatment of her rectal cancer.  Xeloda was dosed at ~780 mg/m2 BID on radiation days only, during that course. Patient tolerated Xeloda fairly well at that time, managing mild diarrhea and thrombocytopenia  Patient is s/p resection performed 12/21/17, negative margins, 1/23 lymph nodes positive for metastatic disease  Labs from 01/17/18 assessed, OK for treatment.  Xeloda will be dosed at ~830 mg/m2 BID for 14 days on, 7 days off, repeated every 21 days for adjuvant course. Total daily dose will be the same as during concurrent chemoradiation, even though dose basis is increased, as patient has lost weight.  Current medication list in Epic reviewed, no DDIs with Xeloda identified.  Prescription has been e-scribed to the [facility] for benefits analysis and approval.  Oral Oncology Clinic will continue to follow for insurance authorization, copayment issues, initial counseling and start date.  Johny Drilling, PharmD, BCPS, BCOP  01/18/2018 9:15 AM Oral Oncology Clinic [phone]

## 2018-01-18 NOTE — Telephone Encounter (Signed)
Oral Oncology Patient Advocate Encounter  Confirmed with [place] that Xeloda was picked up on 01/18/18 with a $76.98 copay.  [place] Patient [place] Phone [phone] Fax [phone]

## 2018-02-08 ENCOUNTER — Telehealth: Payer: Self-pay | Admitting: *Deleted

## 2018-02-08 ENCOUNTER — Inpatient Hospital Stay: Payer: Medicare Other | Admitting: Nurse Practitioner

## 2018-02-08 NOTE — Telephone Encounter (Signed)
Having leakage issues with her ostomy again and will not be able to make her appointment at 12:15. Can they be seen later today or do they need to reschedule? She took her last Xeloda on 02/03/18. Husband informed we will need to reschedule for next week.

## 2018-02-08 NOTE — Telephone Encounter (Signed)
Scheduled patient on 02/11/18 at 1015/1045 for lab/OV. Husband notified.

## 2018-02-11 ENCOUNTER — Inpatient Hospital Stay: Payer: Medicare Other

## 2018-02-11 ENCOUNTER — Encounter: Payer: Self-pay | Admitting: Nurse Practitioner

## 2018-02-11 ENCOUNTER — Inpatient Hospital Stay: Payer: Medicare Other | Attending: Oncology | Admitting: Nurse Practitioner

## 2018-02-11 ENCOUNTER — Telehealth: Payer: Self-pay

## 2018-02-11 VITALS — BP 133/59 | HR 92 | Temp 98.5°F | Resp 17 | Ht 63.0 in | Wt 214.5 lb

## 2018-02-11 DIAGNOSIS — Z932 Ileostomy status: Secondary | ICD-10-CM | POA: Diagnosis not present

## 2018-02-11 DIAGNOSIS — C20 Malignant neoplasm of rectum: Secondary | ICD-10-CM

## 2018-02-11 DIAGNOSIS — Z9221 Personal history of antineoplastic chemotherapy: Secondary | ICD-10-CM

## 2018-02-11 DIAGNOSIS — Z79899 Other long term (current) drug therapy: Secondary | ICD-10-CM | POA: Insufficient documentation

## 2018-02-11 DIAGNOSIS — T451X5S Adverse effect of antineoplastic and immunosuppressive drugs, sequela: Secondary | ICD-10-CM | POA: Insufficient documentation

## 2018-02-11 DIAGNOSIS — D6959 Other secondary thrombocytopenia: Secondary | ICD-10-CM

## 2018-02-11 DIAGNOSIS — E039 Hypothyroidism, unspecified: Secondary | ICD-10-CM

## 2018-02-11 DIAGNOSIS — K9409 Other complications of colostomy: Secondary | ICD-10-CM | POA: Diagnosis not present

## 2018-02-11 DIAGNOSIS — Z923 Personal history of irradiation: Secondary | ICD-10-CM

## 2018-02-11 LAB — CBC WITH DIFFERENTIAL (CANCER CENTER ONLY)
Abs Immature Granulocytes: 0.04 10*3/uL (ref 0.00–0.07)
Basophils Absolute: 0 10*3/uL (ref 0.0–0.1)
Basophils Relative: 0 %
Eosinophils Absolute: 0.1 10*3/uL (ref 0.0–0.5)
Eosinophils Relative: 1 %
HCT: 31.7 % — ABNORMAL LOW (ref 36.0–46.0)
Hemoglobin: 10.4 g/dL — ABNORMAL LOW (ref 12.0–15.0)
Immature Granulocytes: 1 %
Lymphocytes Relative: 10 %
Lymphs Abs: 0.6 10*3/uL — ABNORMAL LOW (ref 0.7–4.0)
MCH: 31 pg (ref 26.0–34.0)
MCHC: 32.8 g/dL (ref 30.0–36.0)
MCV: 94.6 fL (ref 80.0–100.0)
Monocytes Absolute: 0.8 10*3/uL (ref 0.1–1.0)
Monocytes Relative: 15 %
Neutro Abs: 4.1 10*3/uL (ref 1.7–7.7)
Neutrophils Relative %: 73 %
Platelet Count: 160 10*3/uL (ref 150–400)
RBC: 3.35 MIL/uL — ABNORMAL LOW (ref 3.87–5.11)
RDW: 17 % — ABNORMAL HIGH (ref 11.5–15.5)
WBC Count: 5.6 10*3/uL (ref 4.0–10.5)
nRBC: 0 % (ref 0.0–0.2)

## 2018-02-11 LAB — CMP (CANCER CENTER ONLY)
ALT: 16 U/L (ref 0–44)
AST: 26 U/L (ref 15–41)
Albumin: 3.2 g/dL — ABNORMAL LOW (ref 3.5–5.0)
Alkaline Phosphatase: 74 U/L (ref 38–126)
Anion gap: 9 (ref 5–15)
BUN: 10 mg/dL (ref 8–23)
CO2: 22 mmol/L (ref 22–32)
Calcium: 9.1 mg/dL (ref 8.9–10.3)
Chloride: 107 mmol/L (ref 98–111)
Creatinine: 0.93 mg/dL (ref 0.44–1.00)
GFR, Est AFR Am: >60 mL/min (ref >60)
GFR, Estimated: >60 mL/min (ref >60)
Glucose, Bld: 138 mg/dL — ABNORMAL HIGH (ref 70–99)
Potassium: 4.3 mmol/L (ref 3.5–5.1)
Sodium: 138 mmol/L (ref 135–145)
Total Bilirubin: 0.8 mg/dL (ref 0.3–1.2)
Total Protein: 7 g/dL (ref 6.5–8.1)

## 2018-02-11 MED ORDER — CAPECITABINE 500 MG PO TABS: ORAL_TABLET | ORAL | 0 refills | Status: DC

## 2018-02-11 MED FILL — CAPECITABINE 500 MG TABS: 500 | 21 days supply | Qty: 98 | Fill #0

## 2018-02-11 NOTE — Telephone Encounter (Signed)
Printed avs and calender of upcoming appointment. Per 1/13 los 

## 2018-02-11 NOTE — Progress Notes (Signed)
  [place] OFFICE PROGRESS NOTE   Diagnosis: Rectal cancer  INTERVAL HISTORY:   Ms. Samantha Lutz returns for follow-up.  He completed cycle 1 adjuvant Xeloda beginning 01/21/2018.  She denies nausea/vomiting.  No mouth sores.  No diarrhea.  No hand or foot pain or redness.  She does note that the feet are dry.  She is emptying the ostomy bag 3 times a day.  She takes Imodium and Metamucil.  She describes the stool output as "pudding".  Main complaint is irritation of the skin around the ostomy.  This is causing moisture which in turn is causing the ostomy bag to leak.  Objective:  Vital signs in last 24 hours:  Blood pressure (!) 133/59, pulse 92, temperature 98.5 F (36.9 C), temperature source Oral, resp. rate 17, height '5\' 3"'$  (1.6 m), weight 214 lb 8 oz (97.3 kg), SpO2 99 %.    HEENT: No thrush or ulcers. Resp: Lungs clear bilaterally. Cardio: Regular rate and rhythm. GI: Abdomen soft and nontender.  No hepatomegaly.  Right abdomen ileostomy. Vascular: No leg edema. Skin: Palms and soles without erythema.  Soles with some dryness.  No skin breakdown.   Lab Results:  Lab Results  Component Value Date   WBC 5.6 02/11/2018   HGB 10.4 (L) 02/11/2018   HCT 31.7 (L) 02/11/2018   MCV 94.6 02/11/2018   PLT 160 02/11/2018   NEUTROABS 4.1 02/11/2018    Imaging:  No results found.  Medications: I have reviewed the patient's current medications.  Assessment/Plan: 1. Rectal cancer ? Mass at 7 cm from the anal verge on colonoscopy 08/09/2017, biopsy revealed invasive adenocarcinoma ? Staging CTs 08/17/2017-no evidence of metastatic disease, asymmetric thickening in the mid rectum ? MR pelvis 09/01/2017, T3N0 lesion beginning at 6.3 cm from the anal sphincter ? Radiation/Xeloda initiated 09/17/2017, completed 10/25/2017 ? 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  Lymphovascular invasion present, intact mismatch repair protein expression,  treatment effect present (TRS 1) ? Cycle 1 adjuvant Xeloda beginning 01/21/2018 ? Cycle 2 adjuvant Xeloda beginning 02/11/2018  2.Hypothyroid  3.   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 Samantha Lutz appears stable.  She has completed 1 cycle of adjuvant Xeloda.  Overall she tolerated well.  Plan to proceed with cycle 2 as scheduled today.  We reviewed the CBC from today.  Counts are adequate to proceed with treatment as above.  She will return for lab and follow-up in 3 weeks.  She will contact the office in the interim with any problems.  She continues follow-up with the ostomy nurses.  She is scheduled to see Dr. Johney Maine later this week and will discuss the skin irritation with him as well.  Reviewed with Dr. Benay Spice.   Ned Card ANP/GNP-BC   02/11/2018  11:08 AM

## 2018-02-12 ENCOUNTER — Telehealth: Payer: Self-pay | Admitting: Nurse Practitioner

## 2018-02-12 NOTE — Telephone Encounter (Signed)
Verbal orders given for below.  

## 2018-02-12 NOTE — Telephone Encounter (Signed)
Copied from [place] [id number]. Topic: Quick Communication - See Telephone Encounter >> Feb 12, 2018  4:07 PM Rutherford Nail, [place] wrote: CRM for notification. See Telephone encounter for: 02/12/18. [place] with [facility] calling and is requesting verbal orders for a  u/a culture and sensitivity. Patient is complaining of urgency and burning with urination. CB#: [phone]

## 2018-02-14 ENCOUNTER — Ambulatory Visit: Payer: Self-pay | Admitting: Surgery

## 2018-02-14 ENCOUNTER — Encounter: Payer: Self-pay | Admitting: Internal Medicine

## 2018-02-14 NOTE — H&P (Signed)
Samantha Lutz Documented: 02/14/2018 10:58 AM Location: [facility] Patient #: [id number] DOB: 1950/02/06 Married / Language: Samantha Lutz / Race: White Female  History of Present Illness Samantha Hector MD; 02/14/2018 3:41 PM) The patient is a 68 year old female who presents with colorectal cancer. Note for "Colorectal cancer": ` ` ` The patient returns s/p robotic low anterior resection with protective diverting loop ileostomy 12/21/2017.      The patient returns to clinic after surgery, gradually improving. He was recommend she get post-adjuvant IV chemotherapy. She declined. They compromised with oral Xeloda capecitabine chemotherapy. She did her first round/cycle. They think that she is supposed to get 5 cycles. 2 weeks oral Xeloda, 1 week off; I think. They've been working closely with wound ostomy clinic. It is hard to keep the bag on for more than 12 hours. They switch to a silicone pace bag which seems to be helping. Some burning and itching but nothing as bad as before. She no longer has diarrhea and is emptying the bag about 3-4 times a day. Her appetite and energy level are improved overall.  Pathology: Diagnosis 1. Colon, segmental resection for tumor, rectosigmoid - RESIDUAL INVASIVE ADENOCARCINOMA, WELL-DIFFERENTIATED, SPANNING 2 CM. - TUMOR INVADES THROUGH MUSCULARIS PROPRIA. - RESECTION MARGINS ARE NEGATIVE. - LYMPHOVASCULAR INVASION IDENTIFIED. 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 - SEE ONCOLOGY TABLE. 2. Colon, resection margin (donut), rectosigmoid distal ring - final margin - BENIGN COLONIC MUCOSA. - NO DYSPLASIA OR MALIGNANCY. Microscopic Comment 1. COLON AND RECTUM: Resection, Including Transanal Disk Excision of Rectal Neoplasms Procedure: Segmental resection rectosigmoid colon. Tumor Site: Middle third of rectum. Tumor Size: 2 cm. Macroscopic Tumor Perforation: Not identified. Histologic Type: Invasive  adenocarcinoma. Histologic Grade: G1, well-differentiated. Tumor Extension: Through muscularis propria. Margins: Negative. Treatment Effect: Present (TRS 1). Lymphovascular Invasion: Present. Perineural Invasion: Not identified. Tumor Deposits: Not identified. Regional Lymph Nodes: Number of Lymph Nodes Involved: Micrometastasis in one node with treatment effect. Number of Lymph Nodes Examined: 23 Pathologic Stage Classification (pTNM, AJCC 8th Edition): ypT3, ypN1a Ancillary Studies: Insufficient tumor for MSI. MMR will be ordered. Representative tumor block: 1D 1 of 3 Supplemental copy SUPPLEMENTAL for UCHENNA, Lutz ([id number]) Microscopic Comment(continued) Comments: None. (v4.0.1.0) Samantha Males MD Pathologist, Electronic Signature (Case signed 12/24/2017) Specimen Samantha Lutz and Clinical Information Specimen(s) Obtained: 1. Colon, segmental resection for tumor, rectosigmoid 2. Colon, resection margin (donut), rectosigmoid distal ring - final margin Specimen Clinical Information 1. rectal cancer (kp) Samantha Lutz 1. Specimen: Rectosigmoid. Specimen integrity: Intact. Specimen length: 29 cm. Mesorectal intactness: Complete. Tumor location: Anterior wall of middle third of rectum. Tumor size: There is a 1 x 0.8 cm stellate centrally granular dark red mucosal umbilication with underlying indurated tissue. Percent of bowel circumference involved: 10 to 15%. Tumor distance to margins: Proximal: 24 cm. Distal: 4 cm. Radial (posterior ascending, posterior descending; lateral and posterior mid-rectum; and entire lower 1/3 rectum): 3 cm to the perirectal soft tissue margin. Macroscopic extent of tumor invasion: The wall beneath the mucosal umbilication is indurated, thickened up to 0.8 cm. No residual mass is identified. Total presumed lymph nodes: Found are twenty-six possible lymph nodes ranging from 0.1 to 0.5 cm. Extramural satellite tumor nodules: None. Mucosal polyp(s):  None. Additional findings: None. Block summary: A= proximal margin. B= distal margin. C-F= stellate mucosal umbilication and underlying indurated wall entirely submitted. G= five possible nodes. H= four possible nodes. I= four possible nodes. J= four possible nodes. K= four possible nodes. L= five possible  nodes. Total 12 blocks. 2. Received in formalin is a 1.1 cm in length and 1.5 cm in diameter ring of tan pink soft soft mucosa with underlying embedded metallic staples in the wall. Representative sections in one block. (SW:gt, 12/24/17) 2 of 3 Supplemental copy SUPPLEMENTAL for Samantha, Lutz ([id number]) Disclaimer Some of these immunohistochemical stains may have been developed and the performance characteristics determined by [facility]. Some may not have been cleared or approved by the U.S. Food and Drug Administration. The FDA has determined that such clearance or approval is not necessary. This test is used for clinical purposes. It should not be regarded as investigational or for research. This laboratory is certified under the [place] (CLIA-88) as qualified to perform high complexity clinical laboratory testing. Report signed out from the following location(s) Technical Component was performed at [facility]. [address],[place] [address].IWLN:[id number]., Interpretation was performed at [facility] [place], [place], [place] [address]. CLIA #: [id number],` ` `  12/21/2017  12:52 PM  PATIENT: Samantha Lutz 68 y.o. female  Patient Care Team: Lance Sell, NP as PCP - General (Nurse Practitioner) Delsa Bern, MD (Obstetrics and Gynecology) Melissa Noon, [place] (Optometry) [name], MD as Consulting Physician (General Surgery) Danis, Kirke Corin, MD as Consulting Physician (Gastroenterology) Ladell Pier, MD as Consulting  Physician (Oncology) Kyung Rudd, MD as Consulting Physician (Radiation Oncology)  PRE-OPERATIVE DIAGNOSIS: Rectal cancer  POST-OPERATIVE DIAGNOSIS: Rectal cancer  PROCEDURE:  XI ROBOTIC ASSISTED LOWER ANTERIOR RECTOSIGMOID RESECTION Evaluation of colon and rectal perfusion using firefly immunofluorescence DIVERTING LOOP ILEOSTOMY RIGID PROCTOSCOPY  SURGEON: Samantha Hector, MD  ASSISTANT: Nadeen Landau, MD An experienced assistant was required given the standard of surgical care given the complexity of the case. This assistant was needed for exposure, dissection, suctioning, retraction, instrument exchange, etc.  ANESTHESIA: local and general  EBL: Total I/O In: 1000 [I.V.:1000] Out: 550 [Urine:350; Blood:200]  Delay start of Pharmacological VTE agent (>24hrs) due to surgical blood loss or risk of bleeding: no  DRAINS: 53 [place] Blake drain goes from right mid abdomen down into the pelvis around the low anastomosis  SPECIMEN:  Rectosigmoid colon.  Distal rectal anastomotic ring  DISPOSITION OF SPECIMEN: PATHOLOGY  COUNTS: YES  PLAN OF CARE: Admit to inpatient  PATIENT DISPOSITION: PACU - hemodynamically stable.  INDICATION:   Pleasant morbidly obese female found to have bulky mid rectal tumor. Staged as T3. Underwent neoadjuvant chemoradiation therapy. Ready for resection. I recommended segmental resection:  The anatomy & physiology of the digestive tract was discussed. The pathophysiology of the rectal pathology was discussed. Natural history risks without surgery was discussed. I worked to give an overview of the disease and the frequent need to have multispecialty involvement. I feel the risks of no intervention will lead to serious problems that outweigh the operative risks; therefore, I recommended a partial proctocolectomy to remove the pathology. Minimally Invasive (Robotic/Laparoscopic) & open techniques were discussed. We will work  to preserve anal & pelvic floor function without sacrificing cure.  Risks such as bleeding, infection, abscess, leak, reoperation, possible temporary or permanent ostomy, hernia, stroke, heart attack, death, and other risks were discussed. I noted a good likelihood this will help address the problem. Goals of post-operative recovery were discussed as well. We will work to minimize complications. Educational information was available as well. Questions were answered. The patient expresses understanding & wishes to proceed with surgery.  OR FINDINGS:  Patient had circumferential scarring in the mid rectum just distal to the low peritoneal reflection around 7-8 cm from the sphincters consistent with treated cancer. Tattooing proximal and distal to it. Resected.  No obvious metastatic disease on visceral parietal peritoneum or liver.  The anastomosis rests 4-5 cm from the anal verge by rigid proctoscopy.  DESCRIPTION:  Informed consent was confirmed. The patient underwent general anaesthesia without difficulty. The patient was positioned appropriately. 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 VTE prevention in place. The patient's abdomen was clipped, prepped, & draped in a sterile fashion. Surgical timeout confirmed our plan.  The patient was positioned in reverse Trendelenburg. Abdominal entry was gained using Varess technique along the left subcostal ridge. I induced carbon dioxide insufflation. End-tidal CO2 not elevated. Insufflated to 15 mm. Port carefully placed in upper abdomen. Camera inspection revealed no injury. Extra ports were carefully placed under direct laparoscopic visualization. I freed some greater omentum off the infraumbilical midline. Assured hemostasis. What omentum had some he is to the right lower quadrant. The patient was carefully positioned. The Intuitive daVinci robot was carefully docked with camera &  instruments carefully placed.  We freed adhesions of greater omentum to the right lower quadrant and ileocecal region to help reduce it into the upper abdomen. The patient had torturous sigmoid colon. I mobilized the left colon somewhat lateral to medial to help straighten out better. With this I can elevate the dilated sigmoid colon and better expose the sigmoid mesentery and mesorectum. I scored the base of peritoneum of the medial side of the mesentery of the left colon from the ligament of Treitz to the peritoneal reflection of the mid rectum. I elevated the sigmoid mesentery and entered into the retro-mesenteric plane. We were able to identify the left ureter and gonadal vessels. We kept those posterior within the retroperitoneum and elevated the left colon mesentery off that. I did isolate the inferior mesenteric artery (IMA) pedicle but did not ligate it yet. I continued distally and got into the avascular plane posterior to the mesorectum. This allowed me to help mobilize the rectum as well by freeing the mesorectum off the sacrum. I mobilized the peritoneal coverings towards the peritoneal reflection on both the right and left sides of the rectum. I stayed away from the right and left ureters. I kept the lateral vascular pedicles to the rectum intact.  I skeletonized the lymph nodes off the inferior mesenteric artery pedicle. I went down to its takeoff from the aorta. I isolated the inferior mesenteric vein off of the ligament of Treitz just cephalad to that as well. After confirming the left ureter was out of the way, I went ahead and ligated the inferior mesenteric artery pedicle just near its takeoff from the aorta. I did ligate the inferior mesenteric vein in a similar fashion. We ensured hemostasis. I skeletonized the mesorectum at the junction at the proximal rectum for the distal point of resection. That I could better mobilize the left colon mesentery off the retroperitoneum. I  mobilized the left colon in a lateral to medial fashion off the line of Toldt up towards the splenic flexure to ensure good mobilization of the remaining left colon to reach into the pelvis.   I then focused on mesorectal excision. Elevated the rectosigmoid anteriorly and follow the presacral plane to resect the mesorectum off his attachments to that in a posterior fashion. I came around anteriorly around the peritoneal coverings on the lateral pedicles  in the anterior peritoneal reflection which was quite distal. Encountered tattooing in that region. Mobilized the anterior mid and distal rectum off the rectovaginal septum. Went again posteriorly and mobilized the rectum off the sacrum coccyx and pelvic floor for good posterior mobilization. I then did a digital and later rigid proctoscopy scopic evaluation to confirm where the lesion was in a stellate strictured region of the distal mid rectum. I transected the lateral pedicles right and left distal to this region. I transected through the mesorectum at the mid/distal junction was able to skeletonize it fully. Confirmed that I had good distal margin of at least 3 cm. I transected at the distal rectum with a robotic stapler firing. 2 firings with one final one on the corner. With that I can reduce the rectosigmoid out of the pelvis. Confirm I had good mesorectal excision and the rest of the pelvis was clear.  I chose a region at the descending/sigmoid junction that was soft and easily reached down to the rectal stump. I transected the mesentery of the colon radially to preserve remaining colon blood supply. I had anesthesia intravenously infuse firefly. Could see good green immunofluorescence rapidly fill up the colon up to the transection point at the distal descending colon. Could also see the remaining rectal stump had excellent perfusion as well.  I evaluated the ileo-cecal region. Mobilize the cecum in a lateral medial fashion  although it had somewhat of a bascule already. Chose a region in the distal ileum about a foot proximal to the ileocecal valve to easily reached over to the left iliac crest. Seem like a good region for a protecting loop ileostomy in this morbidly obese woman. Placed a red rubber catheter through the mesentery in this region and clamped that off.  I created an extraction incision through a small Pfannenstiel incision in the suprapubic region. Placed a wound protector. I was able to eviscerate the rectosigmoid and descending colon out the wound. I clamped the colon proximal to this area using a reusable pursestringer device. Passed a 2-0 Keith needle. I transected at the descending/sigmoid junction with a scalpel. I got healthy bleeding mucosa. We sent the rectosigmoid colon specimen off to go to pathology. We sized the colon orifice. I chose a 33 EEA anvil stapler system. I reinforced the prolene pursestring with interrupted silk suture. I placed the anvil to the open end of the proximal remaining colon and closed around it using the pursestring. We did copious irrigation with crystalloid solution. Hemostasis was good. The distal end of the remaining colon easily reached down to the rectal stump, therefore, splenic flexure mobilization was not needed.   Dr. Dema Severin scrubbed down and did gentle anal dilation and advanced the EEA stapler up the rectal stump. The spike was brought out at the provimal end of the rectal stump under direct visualization. I attached the anvil of the proximal colon the spike of the stapler. Anvil was tightened down and held clamped for 60 seconds. The EEA stapler was fired and held clamped for 30 seconds. The stapler was released & removed. We noted 2 excellent anastomotic rings. Blue stitch is in the proximal ring. Dr Dema Severin did rigid proctoscopy noted the anastomosis was at 4-5 cm from the anal verge consistent with the distal rectum. Confirmed digitally as well.  Given her morbid obesity with chemoradiation therapy preoperatively and low anastomosis, I felt the patient required diverting loop ileostomy to protect to the low colorectal anastomosis. Dr. Dema Severin agreed. We did a final irrigation  of antibiotic solution (900 mg clindamycin/240 mg gentamicin in a liter of crystalloid) & held that for the pelvic air leak test . The rectum was insufflated the rectum while clamping the colon proximal to that anastomosis. There was a negative air leak test. There was no tension of mesentery or bowel at the anastomosis. Tissues looked viable. Ureters & bowel uninjured. The anastomosis looked healthy. Hemostasis was good.  Drain placed through the right paramedian 5 mm cyst port down into the pelvis. Secured to the skin with a 2-0 blue Prolene suture. I created a 3 cm circular defect in the right upper paramedian region at the premarked ileostomy site. Excised the conus subcutaneous tissues and she had quite a thick abdominal wall. Came through the anterior rectus fascia transversely. Split through the right rectus muscle and posterior rectus fascia and peritoneum vertically. I was able to get a red rubber catheter up through the wound to bring up the loop ileostomy. Confirmed proper orientation with the proximal end superior. No twisting or torsion.   Ports & wound protector removed. We changed gloves & redraped the patient per colon SSI prevention protocol. We aspirated the antibiotic irrigation. Hemostasis was good. Sterile unused instruments were used from this point. I closed the skin at the port sites using Monocryl stitch and sterile dressing. I closed the extraction wound using a 0 Vicryl vertical peritoneal closure and a #1 PDS transverse anterior rectal fascial closure like a small Pfannenstiel closure. I closed the skin with some interrupted Monocryl stitches. I placed antibiotic-soaked wicks into the closure at the corners x2. I placed sterile  dressings.  Matured the loop ileostomy in the right upper quadrant. I used 3-0 Vicryl Interrupted sutures. I opened up the ileum eccentrically so that the distal limb was flush with the skin. Proximal limb rolled over to get a good Brooke ileostomy. I telescoped out some ileum out such that she had a 35 mm ileostomy rosebud. Some mild ischemia but viable. Circumferential dermal/seromuscular sutures made for good eventrated Brooke ileostomy. Proximal limb is superior and about 80% of the ostomy.. Distal limb decompressed and 6:00 inferiorly. Ileostomy appliance placed.  Patient extubated go to the PACU recovery room. I had discussed postop care with the patient in detail the office. The skin with the patient and her extended family in the holding area. Instructions are written. I discussed operative findings, updated the patient's status, discussed probable steps to recovery, and gave postoperative recommendations to the patient's spouse. Recommendations were made. Questions were answered. He expressed understanding & appreciation.  Samantha Lutz, M.D., F.A.C.S. Gastrointestinal and Minimally Invasive Surgery [facility] 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 Owens Shark, Sylvester; 02/14/2018 10:59 AM) No Known Drug Allergies [08/21/2017]: Allergies Reconciled  Medication History  (Tanisha A. Owens Shark, RMA; 02/14/2018 11:00 AM) Levothyroxine Sodium (50MCG Tablet, Oral) Active. Multivitamin & Mineral (Oral) Active. Xeloda (500MG Tablet, Oral) Active. Medications Reconciled  Social History Samantha Hector, MD; 02/14/2018 11:09 AM) Caffeine use Carbonated beverages, Coffee, Tea.  No alcohol use No drug use Tobacco use Never smoker.  Family History Samantha Hector, MD; 02/14/2018 11:09 AM) Colon Polyps Father. Diabetes Mellitus Mother. Kidney Disease Sister. Respiratory Condition Mother.  Pregnancy / Birth History Samantha Hector, MD; 02/14/2018 11:09 AM) Age at menarche 8 years. Age of menopause 65-50 Contraceptive History Oral contraceptives. Gravida 2 Maternal age 72-25 Para 2  Other Problems Samantha Hector, MD; 02/14/2018 11:09 AM) Arthritis Colon Cancer Thyroid Disease    Vitals (Tanisha A. Brown RMA; 02/14/2018 10:59 AM) 02/14/2018 10:59 AM Weight: 215.4 lb Height: 63in Body Surface Area: 2 m Body Mass Index: 38.16 kg/m  Temp.: 98.57F  BP: 138/84 (Sitting, Left Arm, Standard)      Physical Exam Samantha Hector MD; 02/14/2018 11:23 AM)  General Mental Status-Alert. General Appearance-Not in acute distress. Voice-Normal. Note: Relaxed. Nontoxic.  Integumentary Global Assessment Normal Exam - Distribution of scalp and body hair is normal. General Characteristics Overall Skin Surface - no rashes and no suspicious lesions.  Head and Neck Head-normocephalic, atraumatic with no lesions or palpable masses. Face Global Assessment - atraumatic, no absence of expression. Neck Global Assessment - no abnormal movements, no decreased range of motion. Trachea-midline. Thyroid Gland Characteristics - non-tender.  Eye Eyeball - Left-Extraocular movements intact, No Nystagmus. Eyeball - Right-Extraocular movements intact, No Nystagmus. Upper Eyelid - Left-No Cyanotic. Upper Eyelid -  Right-No Cyanotic.  Chest and Lung Exam Inspection Accessory muscles - No use of accessory muscles in breathing.  Abdomen Note: Ileostomy at right upper quadrant. More flat but pink at base. Green thick succus and back. Bag staying on for now. No major rash.  Incisions with normal healing ridges. No active bleeding. No cellulitis. No guarding/rebound tenderness.   Peripheral Vascular Upper Extremity Inspection - Left - Not Gangrenous, No Petechiae. Right - Not Gangrenous, No Petechiae.  Neurologic Neurologic evaluation reveals -normal attention span and ability to concentrate, able to name objects and repeat phrases. Appropriate fund of knowledge and normal coordination.  Neuropsychiatric Mental status exam performed with findings of-able to articulate well with normal speech/language, rate, volume and coherence and no evidence of hallucinations, delusions, obsessions or homicidal/suicidal ideation. Orientation-oriented X3.  Musculoskeletal Global Assessment Gait and Station - normal gait and station.  Lymphatic General Lymphatics Description - No Generalized lymphadenopathy.    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 Recovering status post robotic low anterior resection with low anastomosis and protective loop ileostomy .  She is stage III lymph node positive disease. She would benefit from post-adjuvant chemotherapy. We initially had arranged for my partner to do a Port-A-Cath last Wednesday but was canceled due to ostomy and travel issues. They compromised and she is on oral capecitabine/Xeloda. First treatment done.  Would plan ileostomy takedown 3-4 weeks after completing oral chemotherapy. We'll try and post  Patient will require a contrast enema to make sure there is no leak at her low colorectal anastomosis. We'll tentatively plan that next month  I  would like to see her in the office for a quick examination and discussion of surgery just before ileostomy takedown. NO Bowel prep  Current Plans Pt Education - [facility] Colorectal Cancer (AT): discussed with patient and provided information. 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  His been hard for the back to stay sealed with some  leaking around it. The rosebud is coming back out again which helps. They switch to a silicone type wafer which helps as well.  Her diarrhea has resolved and her bowels are more slow which is helpful.  Continue working to help troubleshoot her ileostomy bag.  Plan ileostomy takedown 3-4 weeks after completing chemotherapy. We will recheck out and I asked the patient to reach out on getting a specific and day on her oral Xeloda.  Continue working with [employer] for troubleshooting. Continue with wound ostomy clinic in [facility].  Current Plans Pt Education - [facility] Ostomy HCI (Tria Noguera): discussed with patient and provided information. You are being scheduled for surgery- Our schedulers will call you.  You should hear from our office's scheduling department within 5 working days about the location, date, and time of surgery. We try to make accommodations for patient's preferences in scheduling surgery, but sometimes the OR schedule or the surgeon's schedule prevents [place] from making those accommodations.  If you have not heard from our office [phone]) in 5 working days, call the office and ask for your surgeon's nurse.  If you have other questions about your diagnosis, plan, or surgery, call the office and ask for your surgeon's nurse.  Written instructions provided The anatomy & physiology of the digestive tract was discussed. The pathophysiology was discussed. Possibility of remaining with an ostomy permanently was discussed.  I offered ostomy takedown. Laparoscopic & open techniques were discussed.  Risks such as bleeding, infection, abscess, leak, reoperation, possible re-ostomy, injury to other organs, hernia, heart attack, death, and other risks were discussed. I noted a good likelihood this will help address the problem. Goals of post-operative recovery were discussed as well. We will work to minimize complications. Questions were answered. The patient expresses understanding & wishes to proceed with surgery.  Samantha Hector, MD, FACS, MASCRS Gastrointestinal and Minimally Invasive Surgery    [address], [place], [place] [address] [phone] Main / Paging [phone] Fax

## 2018-03-01 ENCOUNTER — Inpatient Hospital Stay: Payer: Medicare Other

## 2018-03-01 ENCOUNTER — Telehealth: Payer: Self-pay | Admitting: Oncology

## 2018-03-01 ENCOUNTER — Inpatient Hospital Stay (HOSPITAL_BASED_OUTPATIENT_CLINIC_OR_DEPARTMENT_OTHER): Payer: Medicare Other | Admitting: Oncology

## 2018-03-01 VITALS — BP 140/59 | HR 89 | Temp 98.2°F | Resp 19 | Ht 63.0 in | Wt 210.8 lb

## 2018-03-01 DIAGNOSIS — T451X5S Adverse effect of antineoplastic and immunosuppressive drugs, sequela: Secondary | ICD-10-CM

## 2018-03-01 DIAGNOSIS — Z9221 Personal history of antineoplastic chemotherapy: Secondary | ICD-10-CM | POA: Diagnosis not present

## 2018-03-01 DIAGNOSIS — Z923 Personal history of irradiation: Secondary | ICD-10-CM | POA: Diagnosis not present

## 2018-03-01 DIAGNOSIS — Z79899 Other long term (current) drug therapy: Secondary | ICD-10-CM

## 2018-03-01 DIAGNOSIS — D6959 Other secondary thrombocytopenia: Secondary | ICD-10-CM

## 2018-03-01 DIAGNOSIS — C20 Malignant neoplasm of rectum: Secondary | ICD-10-CM

## 2018-03-01 DIAGNOSIS — Z932 Ileostomy status: Secondary | ICD-10-CM | POA: Diagnosis not present

## 2018-03-01 DIAGNOSIS — K9409 Other complications of colostomy: Secondary | ICD-10-CM

## 2018-03-01 DIAGNOSIS — E039 Hypothyroidism, unspecified: Secondary | ICD-10-CM

## 2018-03-01 LAB — CBC WITH DIFFERENTIAL (CANCER CENTER ONLY)
Abs Immature Granulocytes: 0.03 10*3/uL (ref 0.00–0.07)
Basophils Absolute: 0 10*3/uL (ref 0.0–0.1)
Basophils Relative: 0 %
Eosinophils Absolute: 0.1 10*3/uL (ref 0.0–0.5)
Eosinophils Relative: 2 %
HCT: 30.9 % — ABNORMAL LOW (ref 36.0–46.0)
Hemoglobin: 10 g/dL — ABNORMAL LOW (ref 12.0–15.0)
Immature Granulocytes: 1 %
Lymphocytes Relative: 9 %
Lymphs Abs: 0.6 10*3/uL — ABNORMAL LOW (ref 0.7–4.0)
MCH: 30.4 pg (ref 26.0–34.0)
MCHC: 32.4 g/dL (ref 30.0–36.0)
MCV: 93.9 fL (ref 80.0–100.0)
Monocytes Absolute: 0.7 10*3/uL (ref 0.1–1.0)
Monocytes Relative: 11 %
Neutro Abs: 4.8 10*3/uL (ref 1.7–7.7)
Neutrophils Relative %: 77 %
Platelet Count: 202 10*3/uL (ref 150–400)
RBC: 3.29 MIL/uL — ABNORMAL LOW (ref 3.87–5.11)
RDW: 18.7 % — ABNORMAL HIGH (ref 11.5–15.5)
WBC Count: 6.2 10*3/uL (ref 4.0–10.5)
nRBC: 0 % (ref 0.0–0.2)

## 2018-03-01 LAB — CMP (CANCER CENTER ONLY)
ALT: 8 U/L (ref 0–44)
AST: 19 U/L (ref 15–41)
Albumin: 3.1 g/dL — ABNORMAL LOW (ref 3.5–5.0)
Alkaline Phosphatase: 72 U/L (ref 38–126)
Anion gap: 11 (ref 5–15)
BUN: 10 mg/dL (ref 8–23)
CO2: 25 mmol/L (ref 22–32)
Calcium: 9.2 mg/dL (ref 8.9–10.3)
Chloride: 105 mmol/L (ref 98–111)
Creatinine: 0.93 mg/dL (ref 0.44–1.00)
GFR, Est AFR Am: >60 mL/min (ref >60)
GFR, Estimated: >60 mL/min (ref >60)
Glucose, Bld: 153 mg/dL — ABNORMAL HIGH (ref 70–99)
Potassium: 3.8 mmol/L (ref 3.5–5.1)
Sodium: 141 mmol/L (ref 135–145)
Total Bilirubin: 0.7 mg/dL (ref 0.3–1.2)
Total Protein: 6.9 g/dL (ref 6.5–8.1)

## 2018-03-01 NOTE — Progress Notes (Signed)
  [facility] OFFICE PROGRESS NOTE   Diagnosis: Rectal cancer  INTERVAL HISTORY:   Samantha Lutz returns for a scheduled visit.  She completed another cycle of Xeloda beginning 02/11/2018.  No mouth sores, diarrhea, or hand/foot pain.  She reports malaise and anorexia while on Xeloda.  Her symptoms are partially improved this week.  No nausea.  No difficulty with the ileostomy.  She empties the ileostomy approximately 3 times daily.  She has decided to discontinue chemotherapy.  Objective:  Vital signs in last 24 hours:  Blood pressure (!) 140/59, pulse 89, temperature 98.2 F (36.8 C), temperature source Oral, resp. rate 19, height 5' 3" (1.6 m), weight 210 lb 12.8 oz (95.6 kg), SpO2 100 %.    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 Vascular: No leg edema  Skin: Palms without erythema or skin breakdown    Lab Results:  Lab Results  Component Value Date   WBC 6.2 03/01/2018   HGB 10.0 (L) 03/01/2018   HCT 30.9 (L) 03/01/2018   MCV 93.9 03/01/2018   PLT 202 03/01/2018   NEUTROABS 4.8 03/01/2018    CMP  Lab Results  Component Value Date   NA 138 02/11/2018   K 4.3 02/11/2018   CL 107 02/11/2018   CO2 22 02/11/2018   GLUCOSE 138 (H) 02/11/2018   BUN 10 02/11/2018   CREATININE 0.93 02/11/2018   CALCIUM 9.1 02/11/2018   PROT 7.0 02/11/2018   ALBUMIN 3.2 (L) 02/11/2018   AST 26 02/11/2018   ALT 16 02/11/2018   ALKPHOS 74 02/11/2018   BILITOT 0.8 02/11/2018   GFRNONAA >60 02/11/2018   GFRAA >60 02/11/2018     Medications: I have reviewed the patient's current medications.   Assessment/Plan:  1. Rectal cancer ? Mass at 7 cm from the anal verge on colonoscopy 08/09/2017, biopsy revealed invasive adenocarcinoma ? Staging CTs 08/17/2017-no evidence of metastatic disease, asymmetric thickening in the mid  rectum ? MR pelvis 09/01/2017, T3N0 lesion beginning at 6.3 cm from the anal sphincter ? Radiation/Xeloda initiated 09/17/2017, completed 10/25/2017 ? 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  Lymphovascular invasion present, intact mismatch repair protein expression, treatment effect present (TRS 1) ? Cycle 1 adjuvant Xeloda beginning 01/21/2018 ? Cycle 2 adjuvant Xeloda beginning 02/11/2018 ? Xeloda discontinued after cycle 2 secondary to patient preference  2.Hypothyroid  3.    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  She has completed 2 cycles of adjuvant Xeloda.  Samantha Lutz has decided against further chemotherapy.  I explained the standard of care is to receive 6 months of systemic therapy.  She understands that the cancer recurs it is unlikely that any therapy would be curative.  She says that she has made a firm decision to not receive further chemotherapy.  She would like to schedule the ileostomy reversal with Dr. Johney [place] as soon as possible.  She will return for an office visit and CEA in 3 months.  She will contact us if the malaise and anorexia do not resolve over the next few weeks.  Betsy Coder, MD  03/01/2018  9:47 AM

## 2018-03-01 NOTE — Telephone Encounter (Signed)
Scheduled appt per 01/31 los. ° °Printed calendar and avs. °

## 2018-03-04 ENCOUNTER — Ambulatory Visit: Payer: Self-pay | Admitting: Surgery

## 2018-03-05 ENCOUNTER — Other Ambulatory Visit: Payer: Self-pay | Admitting: Surgery

## 2018-03-05 DIAGNOSIS — C20 Malignant neoplasm of rectum: Secondary | ICD-10-CM

## 2018-03-11 ENCOUNTER — Ambulatory Visit
Admission: RE | Admit: 2018-03-11 | Discharge: 2018-03-11 | Disposition: A | Discharge status: Home / Self Care | Payer: Medicare Other | Source: Ambulatory Visit | Attending: Surgery | Admitting: Surgery

## 2018-03-11 DIAGNOSIS — C20 Malignant neoplasm of rectum: Secondary | ICD-10-CM

## 2018-04-17 NOTE — Progress Notes (Signed)
12-13-17 (Epic) EKG, and CXR   Pt contacted regarding COVID-19 screening prior to PAT appointment. Pt denies cough, fever >100.4, runny nose, sore throat, and travel within the last 14 days. Pt does report difficult breathing and pain that radiates from R back to her breast x 2 days. Pt advised to contact PCP to discuss discomfort if she feel it is warranted.

## 2018-04-17 NOTE — Patient Instructions (Signed)
Samantha Lutz  04/17/2018   Your procedure is scheduled on: 04-24-18    Report to [facility] Main  Entrance    Report to Admitting at 1:00 PM    Call this number if you have problems the morning of surgery [phone]    Remember: Do not eat food or drink liquids :After Midnight.  NO SOLID FOOD AFTER MIDNIGHT THE NIGHT PRIOR TO SURGERY. NOTHING BY MOUTH EXCEPT CLEAR LIQUIDS UNTIL 3 HOURS PRIOR TO [place] SURGERY. PLEASE FINISH ENSURE DRINK PER SURGEON ORDER 3 HOURS PRIOR TO SCHEDULED SURGERY TIME WHICH NEEDS TO BE COMPLETED AT 12:00 PM.    CLEAR LIQUID DIET   Foods Allowed                                                                     Foods Excluded  Coffee and tea, regular and decaf                             liquids that you cannot  Plain Jell-O in any flavor                                             see through such as: Fruit ices (not with fruit pulp)                                     milk, soups, orange juice  Iced Popsicles                                    All solid food Carbonated beverages, regular and diet                                    Cranberry, grape and apple juices Sports drinks like Gatorade Lightly seasoned clear broth or consume(fat free) Sugar, honey syrup  Sample Menu Breakfast                                Lunch                                     Supper Cranberry juice                    Beef broth                            Chicken broth Jell-O  Grape juice                           Apple juice Coffee or tea                        Jell-O                                      Popsicle                                                Coffee or tea                        Coffee or tea  _____________________________________________________________________   BRUSH YOUR TEETH MORNING OF SURGERY AND RINSE YOUR MOUTH OUT, NO CHEWING GUM CANDY OR MINTS   Take these medicines the  morning of surgery with A SIP OF WATER: Levothyroxine (Synthroid)                                You may not have any metal on your body including hair pins and              piercings  Do not wear jewelry, make-up, lotions, powders or perfumes, deodorant             Do not wear nail polish.  Do not shave  48 hours prior to surgery.              Do not bring valuables to the hospital. [place].  Contacts, dentures or bridgework may not be worn into surgery.  Leave suitcase in the car. After surgery it may be brought to your room.     Patients discharged the day of surgery will not be allowed to drive home. IF YOU ARE HAVING SURGERY AND GOING HOME THE SAME DAY, YOU MUST HAVE AN ADULT TO DRIVE YOU HOME AND BE WITH YOU FOR 24 HOURS. YOU MAY GO HOME BY TAXI OR UBER OR ORTHERWISE, BUT AN ADULT MUST ACCOMPANY YOU HOME AND STAY WITH YOU FOR 24 HOURS.    Special Instructions: Please consume a Clear Liquid Diet the Day before your prep              Please read over the following fact sheets you were given: _____________________________________________________________________  [facility] - Preparing for Surgery Before surgery, you can play an important role.  Because skin is not sterile, your skin needs to be as free of germs as possible.  You can reduce the number of germs on your skin by washing with CHG (chlorahexidine gluconate) soap before surgery.  CHG is an antiseptic cleaner which kills germs and bonds with the skin to continue killing germs even after washing. Please DO NOT use if you have an allergy to CHG or antibacterial soaps.  If your skin becomes reddened/irritated stop using the CHG and inform your nurse when you arrive at Short Stay. Do not shave (including legs and underarms) for at least 48 hours prior  to the first CHG shower.  You may shave your face/neck. Please follow these instructions carefully:  1.  Shower with CHG Soap the  night before surgery and the  morning of Surgery.  2.  If you choose to wash your hair, wash your hair first as usual with your  normal  shampoo.  3.  After you shampoo, rinse your hair and body thoroughly to remove the  shampoo.                           4.  Use CHG as you would any other liquid soap.  You can apply chg directly  to the skin and wash                       Gently with a scrungie or clean washcloth.  5.  Apply the CHG Soap to your body ONLY FROM THE NECK DOWN.   Do not use on face/ open                           Wound or open sores. Avoid contact with eyes, ears mouth and genitals (private parts).                       Wash face,  Genitals (private parts) with your normal soap.             6.  Wash thoroughly, paying special attention to the area where your surgery  will be performed.  7.  Thoroughly rinse your body with warm water from the neck down.  8.  DO NOT shower/wash with your normal soap after using and rinsing off  the CHG Soap.                9.  Pat yourself dry with a clean towel.            10.  Wear clean pajamas.            11.  Place clean sheets on your bed the night of your first shower and do not  sleep with pets. Day of Surgery : Do not apply any lotions/deodorants the morning of surgery.  Please wear clean clothes to the hospital/surgery center.  FAILURE TO FOLLOW THESE INSTRUCTIONS MAY RESULT IN THE CANCELLATION OF YOUR SURGERY PATIENT SIGNATURE_________________________________  NURSE SIGNATURE__________________________________  ________________________________________________________________________   Samantha Lutz  An incentive spirometer is a tool that can help keep your lungs clear and active. This tool measures how well you are filling your lungs with each breath. Taking long deep breaths may help reverse or decrease the chance of developing breathing (pulmonary) problems (especially infection) following:  A long period of time when you  are unable to move or be active. BEFORE THE PROCEDURE   If the spirometer includes an indicator to show your best effort, your nurse or respiratory therapist will set it to a desired goal.  If possible, sit up straight or lean slightly forward. Try not to slouch.  Hold the incentive spirometer in an upright position. INSTRUCTIONS FOR USE  1. Sit on the edge of your bed if possible, or sit up as far as you can in bed or on a chair. 2. Hold the incentive spirometer in an upright position. 3. Breathe out normally. 4. Place the mouthpiece in your mouth and seal your lips tightly around it. 5. Breathe in  slowly and as deeply as possible, raising the piston or the ball toward the top of the column. 6. Hold your breath for 3-5 seconds or for as long as possible. Allow the piston or ball to fall to the bottom of the column. 7. Remove the mouthpiece from your mouth and breathe out normally. 8. Rest for a few seconds and repeat Steps 1 through 7 at least 10 times every 1-2 hours when you are awake. Take your time and take a few normal breaths between deep breaths. 9. The spirometer may include an indicator to show your best effort. Use the indicator as a goal to work toward during each repetition. 10. After each set of 10 deep breaths, practice coughing to be sure your lungs are clear. If you have an incision (the cut made at the time of surgery), support your incision when coughing by placing a pillow or rolled up towels firmly against it. Once you are able to get out of bed, walk around indoors and cough well. You may stop using the incentive spirometer when instructed by your caregiver.  RISKS AND COMPLICATIONS  Take your time so you do not get dizzy or light-headed.  If you are in pain, you may need to take or ask for pain medication before doing incentive spirometry. It is harder to take a deep breath if you are having pain. AFTER USE  Rest and breathe slowly and easily.  It can be helpful to  keep track of a log of your progress. Your caregiver can provide you with a simple table to help with this. If you are using the spirometer at home, follow these instructions: [place] IF:   You are having difficultly using the spirometer.  You have trouble using the spirometer as often as instructed.  Your pain medication is not giving enough relief while using the spirometer.  You develop fever of 100.5 F (38.1 C) or higher. SEEK IMMEDIATE MEDICAL CARE IF:   You cough up bloody sputum that had not been present before.  You develop fever of 102 F (38.9 C) or greater.  You develop worsening pain at or near the incision site. MAKE SURE YOU:   Understand these instructions.  Will watch your condition.  Will get help right away if you are not doing well or get worse. Document Released: 05/29/2006 Document Revised: 04/10/2011 Document Reviewed: 07/30/2006 ExitCare Patient Information 2014 Exit[employer], [place].   ________________________________________________________________________  WHAT IS A BLOOD TRANSFUSION? Blood Transfusion Information  A transfusion is the replacement of blood or some of its parts. Blood is made up of multiple cells which provide different functions.  Red blood cells carry oxygen and are used for blood loss replacement.  White blood cells fight against infection.  Platelets control bleeding.  Plasma helps clot blood.  Other blood products are available for specialized needs, such as hemophilia or other clotting disorders. BEFORE THE TRANSFUSION  Who gives blood for transfusions?   Healthy volunteers who are fully evaluated to make sure their blood is safe. This is blood bank blood. Transfusion therapy is the safest it has ever been in the practice of medicine. Before blood is taken from a donor, a complete history is taken to make sure that person has no history of diseases nor engages in risky social behavior (examples are intravenous drug  use or sexual activity with multiple partners). The donor's travel history is screened to minimize risk of transmitting infections, such as malaria. The donated blood is tested for  signs of infectious diseases, such as HIV and hepatitis. The blood is then tested to be sure it is compatible with you in order to minimize the chance of a transfusion reaction. If you or a relative donates blood, this is often done in anticipation of surgery and is not appropriate for emergency situations. It takes many days to process the donated blood. RISKS AND COMPLICATIONS Although transfusion therapy is very safe and saves many lives, the main dangers of transfusion include:   Getting an infectious disease.  Developing a transfusion reaction. This is an allergic reaction to something in the blood you were given. Every precaution is taken to prevent this. The decision to have a blood transfusion has been considered carefully by your caregiver before blood is given. Blood is not given unless the benefits outweigh the risks. AFTER THE TRANSFUSION  Right after receiving a blood transfusion, you will usually feel much better and more energetic. This is especially true if your red blood cells have gotten low (anemic). The transfusion raises the level of the red blood cells which carry oxygen, and this usually causes an energy increase.  The nurse administering the transfusion will monitor you carefully for complications. HOME CARE INSTRUCTIONS  No special instructions are needed after a transfusion. You may find your energy is better. Speak with your caregiver about any limitations on activity for underlying diseases you may have. SEEK MEDICAL CARE IF:   Your condition is not improving after your transfusion.  You develop redness or irritation at the intravenous (IV) site. SEEK IMMEDIATE MEDICAL CARE IF:  Any of the following symptoms occur over the next 12 hours:  Shaking chills.  You have a temperature by mouth  above 102 F (38.9 C), not controlled by medicine.  Chest, back, or muscle pain.  People around you feel you are not acting correctly or are confused.  Shortness of breath or difficulty breathing.  Dizziness and fainting.  You get a rash or develop hives.  You have a decrease in urine output.  Your urine turns a dark color or changes to pink, red, or brown. Any of the following symptoms occur over the next 10 days:  You have a temperature by mouth above 102 F (38.9 C), not controlled by medicine.  Shortness of breath.  Weakness after normal activity.  The white part of the eye turns yellow (jaundice).  You have a decrease in the amount of urine or are urinating less often.  Your urine turns a dark color or changes to pink, red, or brown. Document Released: 01/14/2000 Document Revised: 04/10/2011 Document Reviewed: 09/02/2007 [facility] Patient Information 2014 [place], [place].  _______________________________________________________________________

## 2018-04-19 ENCOUNTER — Inpatient Hospital Stay (HOSPITAL_COMMUNITY)
Admission: RE | Admit: 2018-04-19 | Discharge: 2018-04-19 | Disposition: A | Discharge status: Home / Self Care | Payer: Medicare Other | Source: Ambulatory Visit

## 2018-05-08 ENCOUNTER — Other Ambulatory Visit: Payer: Self-pay | Admitting: *Deleted

## 2018-05-08 MED ORDER — LEVOTHYROXINE SODIUM 50 MCG PO TABS: 50.0000 ug | ORAL_TABLET | Freq: Every day | ORAL | 0 refills | Status: DC

## 2018-05-28 ENCOUNTER — Telehealth: Payer: Self-pay | Admitting: Oncology

## 2018-05-28 NOTE — Telephone Encounter (Signed)
Scheduled 6 wk f/u per sch msg. Mailed printout

## 2018-05-30 ENCOUNTER — Other Ambulatory Visit: Payer: Medicare Other

## 2018-05-30 ENCOUNTER — Ambulatory Visit: Payer: Medicare Other | Admitting: Oncology

## 2018-06-10 ENCOUNTER — Ambulatory Visit: Payer: Self-pay | Admitting: Surgery

## 2018-06-21 ENCOUNTER — Telehealth: Payer: Self-pay | Admitting: *Deleted

## 2018-06-21 NOTE — Telephone Encounter (Signed)
Patient called to request reschedule of her 6/11 appts. Having ostomy reversal surgery on 07/10/18. Scheduling message sent.

## 2018-06-27 ENCOUNTER — Telehealth: Payer: Self-pay | Admitting: Oncology

## 2018-06-27 NOTE — Telephone Encounter (Signed)
Scheduled appt per sch msg. Called and left msg for patient.  °

## 2018-06-28 NOTE — Patient Instructions (Addendum)
Samantha Lutz   Your procedure is scheduled on:  Wednesday 07/10/18     Report to [facility] Main  Entrance Report to admitting at  6:30 AM     YOU NEED TO HAVE A COVID 19 TEST ON__6/8_____, THIS TEST MUST BE DONE BEFORE SURGERY, COME TO [facility] ENTRANCE BETWEEN THE HOURS OF 900 AM AND 300 PM ON YOUR COVID TEST DATE.  Follow all bowel prep instructions from  The Dr's office. If none call office at [phone]              Bring ostomy supplies to hospital with you day of surgery    Call this number if you have problems the morning of surgery [phone]    Remember:                                    Do not eat food or drink liquids :After Midnight.             BRUSH YOUR TEETH MORNING OF SURGERY AND RINSE YOUR MOUTH OUT, NO CHEWING GUM CANDY OR MINTS.     Take these medicines the morning of surgery with A SIP OF WATER: Levothyroid (Synthroid)                                You may not have any metal on your body including hair pins and              piercings              Do not wear jewelry, make-up, lotions, powders or perfumes, deodorant             Do not wear nail polish.  Do not shave  48 hours prior to surgery.              Samantha Lutz - Preparing for Surgery Before surgery, you can play an important role .  Because skin is not sterile, your skin needs to be as free of germs as possible.   You can reduce the number of germs on your skin by washing with CHG (chlorahexidine gluconate) soap before surgery.  CHG is an antiseptic cleaner which kills germs and bonds with the skin to continue killing germs even after washing. Please DO NOT use if you have an allergy to CHG or antibacterial soaps.  If your skin becomes reddened/irritated stop using the CHG and inform your nurse when you arrive at Short Stay. Do not shave (including legs and underarms) for at least 48 hours prior to the first CHG shower.  You may shave  your face/neck. Please follow these instructions carefully:  1.  Shower with CHG Soap the night before surgery and the  morning of Surgery.  2.  If you choose to wash your hair, wash your hair first as usual with your  normal  shampoo.  3.  After you shampoo, rinse your hair and body thoroughly to remove the  shampoo.                                        4.  Use CHG as you would any  other liquid soap.  You can apply chg directly  to the skin and wash                       Gently with a scrungie or clean washcloth.  5.  Apply the CHG Soap to your body ONLY FROM THE NECK DOWN.   Do not use on face/ open                           Wound or open sores. Avoid contact with eyes, ears mouth and genitals (private parts).                       Wash face,  Genitals (private parts) with your normal soap.             6.  Wash thoroughly, paying special attention to the area where your surgery  will be performed.  7.  Thoroughly rinse your body with warm water from the neck down.  8.  DO NOT shower/wash with your normal soap after using and rinsing off  the CHG Soap.             9.  Pat yourself dry with a clean towel.            10.  Wear clean pajamas.            11.  Place clean sheets on your bed the night of your first shower and do not  sleep with pets.  Day of Surgery : Do not apply any lotions/deodorants the morning of surgery.  Please wear clean clothes to the hospital/surgery center.  FAILURE TO FOLLOW THESE INSTRUCTIONS MAY RESULT IN THE CANCELLATION OF YOUR SURGERY PATIENT SIGNATURE_________________________________  NURSE SIGNATURE__________________________________  ________________________________________________________________________    Do not bring valuables to the hospital. [facility] IS NOT             RESPONSIBLE   FOR VALUABLES.  Contacts, dentures or bridgework may not be worn into surgery.  .      Name and phone number of your driver:               Please read over  the following fact sheets you were given: _____________________________________________________________________              Incentive Spirometer  An incentive spirometer is a tool that can help keep your lungs clear and active. This tool measures how well you are filling your lungs with each breath. Taking long deep breaths may help reverse or decrease the chance of developing breathing (pulmonary) problems (especially infection) following:  A long period of time when you are unable to move or be active. BEFORE THE PROCEDURE   If the spirometer includes an indicator to show your best effort, your nurse or respiratory therapist will set it to a desired goal.  If possible, sit up straight or lean slightly forward. Try not to slouch.  Hold the incentive spirometer in an upright position. INSTRUCTIONS FOR USE  1. Sit on the edge of your bed if possible, or sit up as far as you can in bed or on a chair. 2. Hold the incentive spirometer in an upright position. 3. Breathe out normally. 4. Place the mouthpiece in your mouth and seal your lips tightly around it. 5. Breathe in slowly and as deeply as possible, raising the piston or the ball toward the top of the  column. 6. Hold your breath for 3-5 seconds or for as long as possible. Allow the piston or ball to fall to the bottom of the column. 7. Remove the mouthpiece from your mouth and breathe out normally. 8. Rest for a few seconds and repeat Steps 1 through 7 at least 10 times every 1-2 hours when you are awake. Take your time and take a few normal breaths between deep breaths. 9. The spirometer may include an indicator to show your best effort. Use the indicator as a goal to work toward during each repetition. 10. After each set of 10 deep breaths, practice coughing to be sure your lungs are clear. If you have an incision (the cut made at the time of surgery), support your incision when coughing by placing a pillow or rolled up towels firmly  against it. Once you are able to get out of bed, walk around indoors and cough well. You may stop using the incentive spirometer when instructed by your caregiver.  RISKS AND COMPLICATIONS  Take your time so you do not get dizzy or light-headed.  If you are in pain, you may need to take or ask for pain medication before doing incentive spirometry. It is harder to take a deep breath if you are having pain. AFTER USE  Rest and breathe slowly and easily.  It can be helpful to keep track of a log of your progress. Your caregiver can provide you with a simple table to help with this. If you are using the spirometer at home, follow these instructions: [place] IF:   You are having difficultly using the spirometer.  You have trouble using the spirometer as often as instructed.  Your pain medication is not giving enough relief while using the spirometer.  You develop fever of 100.5 F (38.1 C) or higher. SEEK IMMEDIATE MEDICAL CARE IF:   You cough up bloody sputum that had not been present before.  You develop fever of 102 F (38.9 C) or greater.  You develop worsening pain at or near the incision site. MAKE SURE YOU:   Understand these instructions.  Will watch your condition.  Will get help right away if you are not doing well or get worse. Document Released: 05/29/2006 Document Revised: 04/10/2011 Document Reviewed: 07/30/2006 [facility] Patient Information 2014 [place], [place].   ________________________________________________________________________

## 2018-07-01 ENCOUNTER — Encounter (HOSPITAL_COMMUNITY)
Admission: RE | Admit: 2018-07-01 | Discharge: 2018-07-01 | Disposition: A | Discharge status: Home / Self Care | Payer: Medicare Other | Source: Ambulatory Visit | Attending: Surgery | Admitting: Surgery

## 2018-07-01 ENCOUNTER — Encounter (HOSPITAL_COMMUNITY): Payer: Self-pay

## 2018-07-01 ENCOUNTER — Other Ambulatory Visit: Payer: Self-pay

## 2018-07-01 DIAGNOSIS — Z01812 Encounter for preprocedural laboratory examination: Secondary | ICD-10-CM | POA: Insufficient documentation

## 2018-07-01 LAB — CBC
HCT: 35.2 % — ABNORMAL LOW (ref 36.0–46.0)
Hemoglobin: 11.5 g/dL — ABNORMAL LOW (ref 12.0–15.0)
MCH: 29.3 pg (ref 26.0–34.0)
MCHC: 32.7 g/dL (ref 30.0–36.0)
MCV: 89.6 fL (ref 80.0–100.0)
Platelets: 119 10*3/uL — ABNORMAL LOW (ref 150–400)
RBC: 3.93 MIL/uL (ref 3.87–5.11)
RDW: 15.4 % (ref 11.5–15.5)
WBC: 4 10*3/uL (ref 4.0–10.5)
nRBC: 0 % (ref 0.0–0.2)

## 2018-07-01 LAB — COMPREHENSIVE METABOLIC PANEL
ALT: 14 U/L (ref 0–44)
AST: 26 U/L (ref 15–41)
Albumin: 3.8 g/dL (ref 3.5–5.0)
Alkaline Phosphatase: 54 U/L (ref 38–126)
Anion gap: 7 (ref 5–15)
BUN: 12 mg/dL (ref 8–23)
CO2: 24 mmol/L (ref 22–32)
Calcium: 9.1 mg/dL (ref 8.9–10.3)
Chloride: 109 mmol/L (ref 98–111)
Creatinine, Ser: 0.88 mg/dL (ref 0.44–1.00)
GFR calc Af Amer: >60 mL/min (ref >60)
GFR calc non Af Amer: >60 mL/min (ref >60)
Glucose, Bld: 91 mg/dL (ref 70–99)
Potassium: 3.9 mmol/L (ref 3.5–5.1)
Sodium: 140 mmol/L (ref 135–145)
Total Bilirubin: 0.7 mg/dL (ref 0.3–1.2)
Total Protein: 7.1 g/dL (ref 6.5–8.1)

## 2018-07-01 NOTE — Progress Notes (Signed)
No orders about bowel prep and ERAS drink. Pt advised to call office to double check with them

## 2018-07-08 ENCOUNTER — Other Ambulatory Visit (HOSPITAL_COMMUNITY)
Admission: RE | Admit: 2018-07-08 | Discharge: 2018-07-08 | Disposition: A | Discharge status: Home / Self Care | Payer: Medicare Other | Source: Ambulatory Visit | Attending: Surgery | Admitting: Surgery

## 2018-07-08 ENCOUNTER — Other Ambulatory Visit: Payer: Self-pay

## 2018-07-08 DIAGNOSIS — Z1159 Encounter for screening for other viral diseases: Secondary | ICD-10-CM | POA: Diagnosis present

## 2018-07-08 LAB — SARS CORONAVIRUS 2 BY RT PCR (HOSPITAL ORDER, PERFORMED IN ~~LOC~~ HOSPITAL LAB): SARS Coronavirus 2: NEGATIVE

## 2018-07-09 ENCOUNTER — Other Ambulatory Visit: Payer: Self-pay

## 2018-07-09 MED ORDER — BUPIVACAINE LIPOSOME 1.3 % IJ SUSP
20.0000 mL | Freq: Once | INTRAMUSCULAR | Status: DC
  Filled 2018-07-09: qty 20

## 2018-07-09 NOTE — Progress Notes (Signed)
SPOKE [place] 19:   COUGH--no  RUNNY NOSE--- no  SORE THROAT---no  NASAL CONGESTION----no  SNEEZING----no  SHORTNESS OF BREATH---no  DIFFICULTY BREATHING---no  TEMP >100.0 -----no  UNEXPLAINED BODY ACHES------no  CHILLS -------- no  HEADACHES ---------no  LOSS OF SMELL/ TASTE --------no    HAVE YOU OR ANY FAMILY MEMBER TRAVELLED PAST 14 DAYS OUT OF THE   COUNTY---no STATE----no COUNTRY----no  HAVE YOU OR ANY FAMILY MEMBER BEEN EXPOSED TO ANYONE WITH COVID 19? no

## 2018-07-09 NOTE — Progress Notes (Signed)
SPOKE W/  Left voicemail for patient to return call     SCREENING SYMPTOMS OF COVID 19:   COUGH--  RUNNY NOSE---   SORE THROAT---  NASAL CONGESTION----  SNEEZING----  SHORTNESS OF BREATH---  DIFFICULTY BREATHING---  TEMP >100.0 -----  UNEXPLAINED BODY ACHES------  CHILLS --------   HEADACHES ---------  LOSS OF SMELL/ TASTE --------    HAVE YOU OR ANY FAMILY MEMBER TRAVELLED PAST 14 DAYS OUT OF THE   COUNTY--- STATE---- COUNTRY----  HAVE YOU OR ANY FAMILY MEMBER BEEN EXPOSED TO ANYONE WITH COVID 19?

## 2018-07-10 ENCOUNTER — Encounter (HOSPITAL_COMMUNITY)
Admission: RE | Disposition: A | Discharge status: Home / Self Care | Payer: Self-pay | Source: Home / Self Care | Attending: Surgery

## 2018-07-10 ENCOUNTER — Encounter (HOSPITAL_COMMUNITY): Payer: Self-pay

## 2018-07-10 ENCOUNTER — Inpatient Hospital Stay (HOSPITAL_COMMUNITY): Payer: Medicare Other | Admitting: Physician Assistant

## 2018-07-10 ENCOUNTER — Inpatient Hospital Stay (HOSPITAL_COMMUNITY): Payer: Medicare Other | Admitting: Registered Nurse

## 2018-07-10 ENCOUNTER — Inpatient Hospital Stay (HOSPITAL_COMMUNITY)
Admission: RE | Admit: 2018-07-10 | Discharge: 2018-07-12 | DRG: 331 | Disposition: A | Discharge status: Home / Self Care | Payer: Medicare Other | Attending: Surgery | Admitting: Surgery

## 2018-07-10 DIAGNOSIS — K9413 Enterostomy malfunction: Principal | ICD-10-CM | POA: Diagnosis present

## 2018-07-10 DIAGNOSIS — Y848 Other medical procedures as the cause of abnormal reaction of the patient, or of later complication, without mention of misadventure at the time of the procedure: Secondary | ICD-10-CM | POA: Diagnosis present

## 2018-07-10 DIAGNOSIS — Z1159 Encounter for screening for other viral diseases: Secondary | ICD-10-CM | POA: Diagnosis not present

## 2018-07-10 DIAGNOSIS — K660 Peritoneal adhesions (postprocedural) (postinfection): Secondary | ICD-10-CM | POA: Diagnosis present

## 2018-07-10 DIAGNOSIS — C20 Malignant neoplasm of rectum: Secondary | ICD-10-CM | POA: Diagnosis present

## 2018-07-10 DIAGNOSIS — Z6832 Body mass index (BMI) 32.0-32.9, adult: Secondary | ICD-10-CM | POA: Diagnosis not present

## 2018-07-10 DIAGNOSIS — E039 Hypothyroidism, unspecified: Secondary | ICD-10-CM | POA: Diagnosis present

## 2018-07-10 DIAGNOSIS — Z79890 Hormone replacement therapy (postmenopausal): Secondary | ICD-10-CM

## 2018-07-10 DIAGNOSIS — Z9221 Personal history of antineoplastic chemotherapy: Secondary | ICD-10-CM | POA: Diagnosis not present

## 2018-07-10 DIAGNOSIS — I10 Essential (primary) hypertension: Secondary | ICD-10-CM | POA: Diagnosis present

## 2018-07-10 DIAGNOSIS — Z85048 Personal history of other malignant neoplasm of rectum, rectosigmoid junction, and anus: Secondary | ICD-10-CM | POA: Diagnosis not present

## 2018-07-10 DIAGNOSIS — K760 Fatty (change of) liver, not elsewhere classified: Secondary | ICD-10-CM | POA: Diagnosis present

## 2018-07-10 DIAGNOSIS — Z800 Family history of malignant neoplasm of digestive organs: Secondary | ICD-10-CM

## 2018-07-10 DIAGNOSIS — Z9889 Other specified postprocedural states: Secondary | ICD-10-CM

## 2018-07-10 HISTORY — PX: ILEOSTOMY CLOSURE: SHX1784

## 2018-07-10 SURGERY — CLOSURE, ILEOSTOMY
Anesthesia: General | Site: Rectum

## 2018-07-10 MED ORDER — SUGAMMADEX SODIUM 200 MG/2ML IV SOLN
INTRAVENOUS | Status: DC | PRN
  Administered 2018-07-10: 200 mg via INTRAVENOUS

## 2018-07-10 MED ORDER — HYDRALAZINE HCL 20 MG/ML IJ SOLN: 10.0000 mg | INTRAMUSCULAR | Status: DC | PRN

## 2018-07-10 MED ORDER — FENTANYL CITRATE (PF) 100 MCG/2ML IJ SOLN
INTRAMUSCULAR | Status: AC
  Filled 2018-07-10: qty 2

## 2018-07-10 MED ORDER — PROCHLORPERAZINE MALEATE 10 MG PO TABS: 10.0000 mg | ORAL_TABLET | Freq: Four times a day (QID) | ORAL | Status: DC | PRN

## 2018-07-10 MED ORDER — LACTATED RINGERS IV BOLUS: 1000.0000 mL | Freq: Three times a day (TID) | INTRAVENOUS | Status: DC | PRN

## 2018-07-10 MED ORDER — ROCURONIUM BROMIDE 10 MG/ML (PF) SYRINGE
PREFILLED_SYRINGE | INTRAVENOUS | Status: AC
  Filled 2018-07-10: qty 10

## 2018-07-10 MED ORDER — MAGIC MOUTHWASH
15.0000 mL | Freq: Four times a day (QID) | ORAL | Status: DC | PRN
  Filled 2018-07-10: qty 15

## 2018-07-10 MED ORDER — PROMETHAZINE HCL 25 MG/ML IJ SOLN: 6.2500 mg | INTRAMUSCULAR | Status: DC | PRN

## 2018-07-10 MED ORDER — BUPIVACAINE HCL (PF) 0.25 % IJ SOLN
INTRAMUSCULAR | Status: DC | PRN
  Administered 2018-07-10: 60 mL

## 2018-07-10 MED ORDER — EPHEDRINE 5 MG/ML INJ
INTRAVENOUS | Status: AC
  Filled 2018-07-10: qty 10

## 2018-07-10 MED ORDER — SODIUM CHLORIDE 0.9 % IV SOLN
2.0000 g | Freq: Two times a day (BID) | INTRAVENOUS | Status: AC
  Administered 2018-07-10: 2 g via INTRAVENOUS
  Filled 2018-07-10: qty 2

## 2018-07-10 MED ORDER — SCOPOLAMINE 1 MG/3DAYS TD PT72
MEDICATED_PATCH | TRANSDERMAL | Status: DC | PRN
  Administered 2018-07-10: 1 via TRANSDERMAL

## 2018-07-10 MED ORDER — LACTATED RINGERS IV SOLN
INTRAVENOUS | Status: DC
  Administered 2018-07-10 (×2): via INTRAVENOUS

## 2018-07-10 MED ORDER — EPHEDRINE SULFATE-NACL 50-0.9 MG/10ML-% IV SOSY
PREFILLED_SYRINGE | INTRAVENOUS | Status: DC | PRN
  Administered 2018-07-10 (×2): 10 mg via INTRAVENOUS

## 2018-07-10 MED ORDER — BUPIVACAINE LIPOSOME 1.3 % IJ SUSP
INTRAMUSCULAR | Status: DC | PRN
  Administered 2018-07-10: 20 mL

## 2018-07-10 MED ORDER — MIDAZOLAM HCL 2 MG/2ML IJ SOLN
INTRAMUSCULAR | Status: AC
  Filled 2018-07-10: qty 2

## 2018-07-10 MED ORDER — ENOXAPARIN SODIUM 40 MG/0.4ML ~~LOC~~ SOLN
40.0000 mg | Freq: Once | SUBCUTANEOUS | Status: AC
  Administered 2018-07-10: 40 mg via SUBCUTANEOUS
  Filled 2018-07-10: qty 0.4

## 2018-07-10 MED ORDER — 0.9 % SODIUM CHLORIDE (POUR BTL) OPTIME
TOPICAL | Status: DC | PRN
  Administered 2018-07-10: 3000 mL

## 2018-07-10 MED ORDER — GABAPENTIN 300 MG PO CAPS
300.0000 mg | ORAL_CAPSULE | ORAL | Status: AC
  Administered 2018-07-10: 300 mg via ORAL
  Filled 2018-07-10: qty 1

## 2018-07-10 MED ORDER — ENSURE SURGERY PO LIQD
237.0000 mL | Freq: Two times a day (BID) | ORAL | Status: DC
  Administered 2018-07-11 – 2018-07-12 (×3): 237 mL via ORAL
  Filled 2018-07-10 (×5): qty 237

## 2018-07-10 MED ORDER — LIDOCAINE 2% (20 MG/ML) 5 ML SYRINGE
INTRAMUSCULAR | Status: DC | PRN
  Administered 2018-07-10: 1.5 mg/kg/h via INTRAVENOUS

## 2018-07-10 MED ORDER — GABAPENTIN 300 MG PO CAPS
300.0000 mg | ORAL_CAPSULE | Freq: Two times a day (BID) | ORAL | Status: DC
  Administered 2018-07-10 – 2018-07-11 (×4): 300 mg via ORAL
  Filled 2018-07-10 (×5): qty 1

## 2018-07-10 MED ORDER — HYDROMORPHONE HCL 1 MG/ML IJ SOLN
0.5000 mg | INTRAMUSCULAR | Status: DC | PRN
  Administered 2018-07-10: 1 mg via INTRAVENOUS
  Filled 2018-07-10: qty 1

## 2018-07-10 MED ORDER — ACETAMINOPHEN 325 MG PO TABS: 325.0000 mg | ORAL_TABLET | Freq: Once | ORAL | Status: DC | PRN

## 2018-07-10 MED ORDER — PROCHLORPERAZINE EDISYLATE 10 MG/2ML IJ SOLN: 5.0000 mg | Freq: Four times a day (QID) | INTRAMUSCULAR | Status: DC | PRN

## 2018-07-10 MED ORDER — LIDOCAINE 2% (20 MG/ML) 5 ML SYRINGE
INTRAMUSCULAR | Status: DC | PRN
  Administered 2018-07-10: 50 mg via INTRAVENOUS

## 2018-07-10 MED ORDER — ROCURONIUM BROMIDE 10 MG/ML (PF) SYRINGE
PREFILLED_SYRINGE | INTRAVENOUS | Status: DC | PRN
  Administered 2018-07-10: 50 mg via INTRAVENOUS

## 2018-07-10 MED ORDER — LEVOTHYROXINE SODIUM 50 MCG PO TABS
50.0000 ug | ORAL_TABLET | Freq: Every day | ORAL | Status: DC
  Administered 2018-07-11 – 2018-07-12 (×2): 50 ug via ORAL
  Filled 2018-07-10 (×2): qty 1

## 2018-07-10 MED ORDER — METOCLOPRAMIDE HCL 5 MG/ML IJ SOLN: 10.0000 mg | Freq: Four times a day (QID) | INTRAMUSCULAR | Status: DC | PRN

## 2018-07-10 MED ORDER — ALUM & MAG HYDROXIDE-SIMETH 200-200-20 MG/5ML PO SUSP: 30.0000 mL | Freq: Four times a day (QID) | ORAL | Status: DC | PRN

## 2018-07-10 MED ORDER — SODIUM CHLORIDE 0.9 % IV SOLN
2.0000 g | INTRAVENOUS | Status: AC
  Administered 2018-07-10: 2 g via INTRAVENOUS
  Filled 2018-07-10: qty 2

## 2018-07-10 MED ORDER — FENTANYL CITRATE (PF) 100 MCG/2ML IJ SOLN
INTRAMUSCULAR | Status: DC | PRN
  Administered 2018-07-10 (×5): 50 ug via INTRAVENOUS

## 2018-07-10 MED ORDER — DIPHENHYDRAMINE HCL 50 MG/ML IJ SOLN: 12.5000 mg | Freq: Four times a day (QID) | INTRAMUSCULAR | Status: DC | PRN

## 2018-07-10 MED ORDER — ACETAMINOPHEN 500 MG PO TABS
1000.0000 mg | ORAL_TABLET | ORAL | Status: AC
  Administered 2018-07-10: 1000 mg via ORAL
  Filled 2018-07-10: qty 2

## 2018-07-10 MED ORDER — ONDANSETRON HCL 4 MG/2ML IJ SOLN: 4.0000 mg | Freq: Four times a day (QID) | INTRAMUSCULAR | Status: DC | PRN

## 2018-07-10 MED ORDER — SACCHAROMYCES BOULARDII 250 MG PO CAPS
250.0000 mg | ORAL_CAPSULE | Freq: Two times a day (BID) | ORAL | Status: DC
  Administered 2018-07-10 – 2018-07-12 (×4): 250 mg via ORAL
  Filled 2018-07-10 (×4): qty 1

## 2018-07-10 MED ORDER — LIDOCAINE HCL 2 % IJ SOLN
INTRAMUSCULAR | Status: AC
  Filled 2018-07-10: qty 20

## 2018-07-10 MED ORDER — CHLORHEXIDINE GLUCONATE CLOTH 2 % EX PADS: 6.0000 | MEDICATED_PAD | Freq: Once | CUTANEOUS | Status: DC

## 2018-07-10 MED ORDER — LACTATED RINGERS IV SOLN: INTRAVENOUS | Status: DC

## 2018-07-10 MED ORDER — MIDAZOLAM HCL 5 MG/5ML IJ SOLN
INTRAMUSCULAR | Status: DC | PRN
  Administered 2018-07-10: 2 mg via INTRAVENOUS

## 2018-07-10 MED ORDER — BUPIVACAINE-EPINEPHRINE (PF) 0.25% -1:200000 IJ SOLN
INTRAMUSCULAR | Status: AC
  Filled 2018-07-10: qty 60

## 2018-07-10 MED ORDER — LIP MEDEX EX OINT
1.0000 "application " | TOPICAL_OINTMENT | Freq: Two times a day (BID) | CUTANEOUS | Status: DC
  Administered 2018-07-10 – 2018-07-11 (×3): 1 via TOPICAL
  Filled 2018-07-10: qty 7

## 2018-07-10 MED ORDER — LACTATED RINGERS IV SOLN
INTRAVENOUS | Status: DC
  Administered 2018-07-10: 15:00:00 via INTRAVENOUS

## 2018-07-10 MED ORDER — ONDANSETRON HCL 4 MG/2ML IJ SOLN
INTRAMUSCULAR | Status: DC | PRN
  Administered 2018-07-10: 4 mg via INTRAVENOUS

## 2018-07-10 MED ORDER — PSYLLIUM 95 % PO PACK
1.0000 | PACK | Freq: Every day | ORAL | Status: DC
  Administered 2018-07-11 – 2018-07-12 (×2): 1 via ORAL
  Filled 2018-07-10 (×2): qty 1

## 2018-07-10 MED ORDER — PROPOFOL 10 MG/ML IV BOLUS
INTRAVENOUS | Status: DC | PRN
  Administered 2018-07-10: 130 mg via INTRAVENOUS

## 2018-07-10 MED ORDER — PROPOFOL 10 MG/ML IV BOLUS
INTRAVENOUS | Status: AC
  Filled 2018-07-10: qty 20

## 2018-07-10 MED ORDER — DEXAMETHASONE SODIUM PHOSPHATE 10 MG/ML IJ SOLN
INTRAMUSCULAR | Status: AC
  Filled 2018-07-10: qty 1

## 2018-07-10 MED ORDER — ALVIMOPAN 12 MG PO CAPS
12.0000 mg | ORAL_CAPSULE | Freq: Two times a day (BID) | ORAL | Status: DC
  Administered 2018-07-11 – 2018-07-12 (×3): 12 mg via ORAL
  Filled 2018-07-10 (×3): qty 1

## 2018-07-10 MED ORDER — BUPIVACAINE HCL (PF) 0.25 % IJ SOLN
INTRAMUSCULAR | Status: AC
  Filled 2018-07-10: qty 60

## 2018-07-10 MED ORDER — ALVIMOPAN 12 MG PO CAPS
12.0000 mg | ORAL_CAPSULE | ORAL | Status: AC
  Administered 2018-07-10: 12 mg via ORAL
  Filled 2018-07-10: qty 1

## 2018-07-10 MED ORDER — FENTANYL CITRATE (PF) 100 MCG/2ML IJ SOLN
25.0000 ug | INTRAMUSCULAR | Status: DC | PRN
  Administered 2018-07-10: 50 ug via INTRAVENOUS

## 2018-07-10 MED ORDER — ONDANSETRON HCL 4 MG/2ML IJ SOLN
INTRAMUSCULAR | Status: AC
  Filled 2018-07-10: qty 2

## 2018-07-10 MED ORDER — METOPROLOL TARTRATE 5 MG/5ML IV SOLN: 5.0000 mg | Freq: Four times a day (QID) | INTRAVENOUS | Status: DC | PRN

## 2018-07-10 MED ORDER — ENOXAPARIN SODIUM 40 MG/0.4ML ~~LOC~~ SOLN
40.0000 mg | SUBCUTANEOUS | Status: DC
  Administered 2018-07-11 – 2018-07-12 (×2): 40 mg via SUBCUTANEOUS
  Filled 2018-07-10 (×2): qty 0.4

## 2018-07-10 MED ORDER — DEXAMETHASONE SODIUM PHOSPHATE 10 MG/ML IJ SOLN
INTRAMUSCULAR | Status: DC | PRN
  Administered 2018-07-10: 8 mg via INTRAVENOUS

## 2018-07-10 MED ORDER — ACETAMINOPHEN 160 MG/5ML PO SOLN: 325.0000 mg | Freq: Once | ORAL | Status: DC | PRN

## 2018-07-10 MED ORDER — MEPERIDINE HCL 50 MG/ML IJ SOLN: 6.2500 mg | INTRAMUSCULAR | Status: DC | PRN

## 2018-07-10 MED ORDER — LIDOCAINE 2% (20 MG/ML) 5 ML SYRINGE
INTRAMUSCULAR | Status: AC
  Filled 2018-07-10: qty 5

## 2018-07-10 MED ORDER — DIPHENHYDRAMINE HCL 12.5 MG/5ML PO ELIX: 12.5000 mg | ORAL_SOLUTION | Freq: Four times a day (QID) | ORAL | Status: DC | PRN

## 2018-07-10 MED ORDER — SUGAMMADEX SODIUM 200 MG/2ML IV SOLN
INTRAVENOUS | Status: AC
  Filled 2018-07-10: qty 2

## 2018-07-10 MED ORDER — ACETAMINOPHEN 10 MG/ML IV SOLN: 1000.0000 mg | Freq: Once | INTRAVENOUS | Status: DC | PRN

## 2018-07-10 MED ORDER — ONDANSETRON HCL 4 MG PO TABS
4.0000 mg | ORAL_TABLET | Freq: Four times a day (QID) | ORAL | Status: DC | PRN
  Administered 2018-07-11: 4 mg via ORAL
  Filled 2018-07-10 (×2): qty 1

## 2018-07-10 MED ORDER — TRAMADOL HCL 50 MG PO TABS
50.0000 mg | ORAL_TABLET | Freq: Four times a day (QID) | ORAL | Status: DC | PRN
  Administered 2018-07-11: 100 mg via ORAL
  Filled 2018-07-10: qty 2

## 2018-07-10 MED ORDER — SCOPOLAMINE 1 MG/3DAYS TD PT72
MEDICATED_PATCH | TRANSDERMAL | Status: AC
  Filled 2018-07-10: qty 1

## 2018-07-10 MED ORDER — ACETAMINOPHEN 500 MG PO TABS
1000.0000 mg | ORAL_TABLET | Freq: Four times a day (QID) | ORAL | Status: DC
  Administered 2018-07-10 – 2018-07-12 (×7): 1000 mg via ORAL
  Filled 2018-07-10 (×7): qty 2

## 2018-07-10 SURGICAL SUPPLY — 52 items
APL PRP STRL LF DISP 70% ISPRP (MISCELLANEOUS) ×2
CHLORAPREP W/TINT 26 (MISCELLANEOUS) ×3 IMPLANT
COVER MAYO STAND STRL (DRAPES) ×1 IMPLANT
COVER WAND RF STERILE (DRAPES) IMPLANT
DECANTER SPIKE VIAL GLASS SM (MISCELLANEOUS) ×3 IMPLANT
DRAIN CHANNEL 19F RND (DRAIN) IMPLANT
DRAPE LAPAROSCOPIC ABDOMINAL (DRAPES) ×3 IMPLANT
DRAPE SHEET LG 3/4 BI-LAMINATE (DRAPES) ×2 IMPLANT
DRAPE UTILITY XL STRL (DRAPES) ×3 IMPLANT
DRAPE WARM FLUID 44X44 (DRAPES) ×3 IMPLANT
DRSG OPSITE POSTOP 4X10 (GAUZE/BANDAGES/DRESSINGS) IMPLANT
DRSG OPSITE POSTOP 4X6 (GAUZE/BANDAGES/DRESSINGS) ×1 IMPLANT
DRSG OPSITE POSTOP 4X8 (GAUZE/BANDAGES/DRESSINGS) ×3 IMPLANT
DRSG TEGADERM 2-3/8X2-3/4 SM (GAUZE/BANDAGES/DRESSINGS) ×6 IMPLANT
DRSG TEGADERM 4X4.75 (GAUZE/BANDAGES/DRESSINGS) ×3 IMPLANT
ELECT PENCIL ROCKER SW 15FT (MISCELLANEOUS) ×1 IMPLANT
ELECT REM PT RETURN 15FT ADLT (MISCELLANEOUS) ×3 IMPLANT
GAUZE SPONGE 4X4 12PLY STRL (GAUZE/BANDAGES/DRESSINGS) ×2 IMPLANT
GLOVE ECLIPSE 8.0 STRL XLNG CF (GLOVE) ×3 IMPLANT
GLOVE INDICATOR 8.0 STRL GRN (GLOVE) ×3 IMPLANT
GOWN STRL REUS W/TWL XL LVL3 (GOWN DISPOSABLE) ×9 IMPLANT
HANDLE SUCTION POOLE (INSTRUMENTS) ×2 IMPLANT
KIT BASIN OR (CUSTOM PROCEDURE TRAY) ×3 IMPLANT
KIT TURNOVER KIT A (KITS) IMPLANT
LEGGING LITHOTOMY PAIR STRL (DRAPES) IMPLANT
PACK GENERAL/GYN (CUSTOM PROCEDURE TRAY) ×3 IMPLANT
RELOAD PROXIMATE 75MM BLUE (ENDOMECHANICALS) ×3 IMPLANT
RELOAD STAPLE 75 3.8 BLU REG (ENDOMECHANICALS) IMPLANT
SPONGE LAP 18X18 RF (DISPOSABLE) IMPLANT
STAPLER 90 3.5 STAND SLIM (STAPLE) ×3
STAPLER 90 3.5 STD SLIM (STAPLE) IMPLANT
STAPLER PROXIMATE 75MM BLUE (STAPLE) ×1 IMPLANT
STAPLER VISISTAT 35W (STAPLE) ×1 IMPLANT
SUCTION POOLE HANDLE (INSTRUMENTS) ×3
SUT MNCRL AB 4-0 PS2 18 (SUTURE) ×3 IMPLANT
SUT PDS AB 1 CT  36 (SUTURE) ×2
SUT PDS AB 1 CT 36 (SUTURE) IMPLANT
SUT PDS AB 1 CTX 36 (SUTURE) ×4 IMPLANT
SUT PDS AB 1 TP1 96 (SUTURE) IMPLANT
SUT SILK 2 0 (SUTURE) ×3
SUT SILK 2 0 SH CR/8 (SUTURE) ×3 IMPLANT
SUT SILK 2-0 18XBRD TIE 12 (SUTURE) ×2 IMPLANT
SUT SILK 3 0 (SUTURE) ×3
SUT SILK 3 0 SH CR/8 (SUTURE) ×3 IMPLANT
SUT SILK 3-0 18XBRD TIE 12 (SUTURE) ×2 IMPLANT
SUT VIC AB 2-0 SH 18 (SUTURE) ×1 IMPLANT
SYR BULB IRRIGATION 50ML (SYRINGE) ×3 IMPLANT
TAPE UMBILICAL COTTON 1/8X30 (MISCELLANEOUS) ×3 IMPLANT
TOWEL OR 17X26 10 PK STRL BLUE (TOWEL DISPOSABLE) ×6 IMPLANT
TOWEL OR NON WOVEN STRL DISP B (DISPOSABLE) ×6 IMPLANT
TRAY FOLEY MTR SLVR 16FR STAT (SET/KITS/TRAYS/PACK) IMPLANT
YANKAUER SUCT BULB TIP 10FT TU (MISCELLANEOUS) ×3 IMPLANT

## 2018-07-10 NOTE — Interval H&P Note (Signed)
History and Physical Interval Note:  07/10/2018 8:26 AM  Samantha Lutz  has presented today for surgery, with the diagnosis of [place], RECTAL CANCER STATUS POST LOW ANTERIOR RESECTION.  The various methods of treatment have been discussed with the patient and family. After consideration of risks, benefits and other options for treatment, the patient has consented to  Procedure(s): ILEOSTOMY LOOP TAKEDOWN (N/A) EXAM UNDER ANESTHESIA (N/A) as a surgical intervention.  The patient's history has been reviewed, patient examined, no change in status, stable for surgery.  I have reviewed the patient's chart and labs.  Questions were answered to the patient's satisfaction.    I have re-reviewed the the patient's records, history, medications, and allergies.  I have re-examined the patient.  I again discussed intraoperative plans and goals of post-operative recovery.  The patient agrees to proceed.  Samantha Lutz  07/15/1950 [id number]  Patient Care Team: Lance Sell, NP as PCP - General (Nurse Practitioner) Delsa Bern, MD (Obstetrics and Gynecology) Melissa Noon, [place] (Optometry) [name], MD as Consulting Physician (General Surgery) Danis, Kirke Corin, MD as Consulting Physician (Gastroenterology) Ladell Pier, MD as Consulting Physician (Oncology) Kyung Rudd, MD as Consulting Physician (Radiation Oncology)  Patient Active Problem List   Diagnosis Date Noted  . Rectal cancer [id number] (1/23 LN) s/p neoadj chemoXRT, robotic LAR resection & diverting loop ileostomy 12/21/2017 09/04/2017    Priority: High  . Ileostomy in place [facility]) 12/21/2017  . Encounter for general adult medical examination with abnormal findings 04/25/2017  . Hypothyroid 01/16/2013  . Right knee pain   . Elevated LFTs 01/03/2011  . Hypertension 12/19/2010  . Morbid obesity with body mass index of 40.0-49.9 (HCC)     Past Medical History:  Diagnosis Date  . Anemia     remote hx.  . Arthritis    Bi lat knee  . Fatty liver    ultrasound December 2012  . Hypothyroid 01/16/2013 dx  . Irregular heartbeat    PER PATIENT REPORT; ONSET 20+YEARS AGO SAW CARDIOLOGY AT THE TIME C/O "ITS BEAT REALLY FAST FOR A MINUTE AND THEN STOPPED " DENIES ANY OTHER CARDIAC SX';  REPORTS CARDIO DID ECHO WHICH WAS NEGATIVE;  NOW COMES AND GOES ;   . Lichen sclerosus    steroids as needed  . Temporary low platelet count (HCC)    SEE LAST LABS IN EPIC    Past Surgical History:  Procedure Laterality Date  . APPENDECTOMY  1967  . [place]  . OSTOMY N/A 12/21/2017   Procedure: DIVERTING LOOP OSTOMY;  Surgeon: [name], MD;  Location: [facility] ORS;  Service: General;  Laterality: N/A;  . PROCTOSCOPY N/A 12/21/2017   Procedure: RIGID PROCTOSCOPY;  Surgeon: [name], MD;  Location: [facility] ORS;  Service: General;  Laterality: N/A;  . XI ROBOTIC ASSISTED LOWER ANTERIOR RESECTION N/A 12/21/2017   Procedure: XI ROBOTIC ASSISTED LOWER ANTERIOR RESECTION;  Surgeon: [name], MD;  Location: [facility] ORS;  Service: General;  Laterality: N/A;    Social History   Socioeconomic History  . Marital status: Married    Spouse name: Not on file  . Number of children: Not on file  . Years of education: Not on file  . Highest education level: Not on file  Occupational History  . Not on file  Social Needs  . Financial resource strain: Not on file  . Food insecurity:    Worry: Not on file  Inability: Not on file  . Transportation needs:    Medical: Not on file    Non-medical: Not on file  Tobacco Use  . Smoking status: Never Smoker  . Smokeless tobacco: Never Used  Substance and Sexual Activity  . Alcohol use: No  . Drug use: No  . Sexual activity: Not on file  Lifestyle  . Physical activity:    Days per week: Not on file    Minutes per session: Not on file  . Stress: Not on file  Relationships  . Social connections:    Talks on phone: Not on file     Gets together: Not on file    Attends religious service: Not on file    Active member of club or organization: Not on file    Attends meetings of clubs or organizations: Not on file    Relationship status: Not on file  . Intimate partner violence:    Fear of current or ex partner: Not on file    Emotionally abused: Not on file    Physically abused: Not on file    Forced sexual activity: Not on file  Other Topics Concern  . Not on file  Social History Narrative  . Not on file    Family History  Problem Relation Age of Onset  . Diabetes Mother   . Hypertension Mother   . COPD Mother   . Glaucoma Father 12  . Stroke Maternal Grandmother 88  . Hypertension Maternal Grandmother   . Rheum arthritis Sister 86  . Hypertension Daughter   . Breast cancer Cousin   . Colon cancer Neg Hx   . Esophageal cancer Neg Hx   . Liver cancer Neg Hx   . Pancreatic cancer Neg Hx   . Stomach cancer Neg Hx   . Rectal cancer Neg Hx     Medications Prior to Admission  Medication Sig Dispense Refill Last Dose  . levothyroxine (SYNTHROID, LEVOTHROID) 50 MCG tablet Take 1 tablet (50 mcg total) by mouth daily. Needs visit for future refills. 90 tablet 0 07/10/2018 at 0500    Current Facility-Administered Medications  Medication Dose Route Frequency Provider Last Rate Last Dose  . bupivacaine liposome (EXPAREL) 1.3 % injection 266 mg  20 mL Infiltration Once [name], MD      . cefoTEtan (CEFOTAN) 2 g in sodium chloride 0.9 % 100 mL IVPB  2 g Intravenous On Call to OR [name], MD      . Chlorhexidine Gluconate Cloth 2 % PADS 6 each  6 each Topical Once [name], MD       And  . Chlorhexidine Gluconate Cloth 2 % PADS 6 each  6 each Topical Once [name], MD      . lactated ringers infusion   Intravenous Continuous Effie Berkshire, MD 10 mL/hr at 07/10/18 [phone]       Allergies  Allergen Reactions  . Epinephrine Palpitations  . Losartan Hives    BP (!) 177/78   Pulse 92    Temp 98.2 F (36.8 C) (Oral)   Resp 16   Ht 5\' 3"  (1.6 m)   Wt 84.4 kg   SpO2 98%   BMI 32.97 kg/m   Labs: Results for orders placed or performed during the hospital encounter of 07/08/18 (from the past 48 hour(s))  SARS Coronavirus 2 (CEPHEID - Performed in [place] hospital lab), Hosp Order     Status: None   Collection Time: 07/08/18 12:05 PM  Result Value Ref Range   SARS Coronavirus 2 NEGATIVE NEGATIVE    Comment: (NOTE) If result is NEGATIVE SARS-CoV-2 target nucleic acids are NOT DETECTED. The SARS-CoV-2 RNA is generally detectable in upper and lower  respiratory specimens during the acute phase of infection. The lowest  concentration of SARS-CoV-2 viral copies this assay can detect is 250  copies / mL. A negative result does not preclude SARS-CoV-2 infection  and should not be used as the sole basis for treatment or other  patient management decisions.  A negative result may occur with  improper specimen collection / handling, submission of specimen other  than nasopharyngeal swab, presence of viral mutation(s) within the  areas targeted by this assay, and inadequate number of viral copies  (<250 copies / mL). A negative result must be combined with clinical  observations, patient history, and epidemiological information. If result is POSITIVE SARS-CoV-2 target nucleic acids are DETECTED. The SARS-CoV-2 RNA is generally detectable in upper and lower  respiratory specimens dur ing the acute phase of infection.  Positive  results are indicative of active infection with SARS-CoV-2.  Clinical  correlation with patient history and other diagnostic information is  necessary to determine patient infection status.  Positive results do  not rule out bacterial infection or co-infection with other viruses. If result is PRESUMPTIVE POSTIVE SARS-CoV-2 nucleic acids MAY BE PRESENT.   A presumptive positive result was obtained on the submitted specimen  and confirmed on repeat  testing.  While 2019 novel coronavirus  (SARS-CoV-2) nucleic acids may be present in the submitted sample  additional confirmatory testing may be necessary for epidemiological  and / or clinical management purposes  to differentiate between  SARS-CoV-2 and other Sarbecovirus currently known to infect humans.  If clinically indicated additional testing with an alternate test  methodology [phone]) is advised. The SARS-CoV-2 RNA is generally  detectable in upper and lower respiratory sp ecimens during the acute  phase of infection. The expected result is Negative. Fact Sheet for Patients:  StrictlyIdeas.no Fact Sheet for Healthcare Providers: BankingDealers.co.za This test is not yet approved or cleared by the [place] FDA and has been authorized for detection and/or diagnosis of SARS-CoV-2 by FDA under an Emergency Use Authorization (EUA).  This EUA will remain in effect (meaning this test can be used) for the duration of the COVID-19 declaration under Section 564(b)(1) of the Act, 21 U.S.C. section 360bbb-3(b)(1), unless the authorization is terminated or revoked sooner. Performed at [facility], [place] [address] [place], [place] [address]     Imaging / Studies: No results found.   Adin Hector, M.D., F.A.C.S. Gastrointestinal and Minimally Invasive Surgery [facility] [address], [place], [place] [address] [phone] Main / Paging  07/10/2018 8:26 AM    Adin Hector

## 2018-07-10 NOTE — Op Note (Addendum)
07/10/2018  11:03 AM  PATIENT:  Samantha Lutz  68 y.o. female  Patient Care Team: Lance Sell, NP as PCP - General (Nurse Practitioner) Delsa Bern, MD (Obstetrics and Gynecology) Melissa Noon, OD (Optometry) [name], MD as Consulting Physician (General Surgery) Danis, Kirke Corin, MD as Consulting Physician (Gastroenterology) Ladell Pier, MD as Consulting Physician (Oncology) Kyung Rudd, MD as Consulting Physician (Radiation Oncology)  PRE-OPERATIVE DIAGNOSIS:  LOOP ILEOSTOMY FOR FECAL DIVERSION, RECTAL CANCER STATUS POST LOW ANTERIOR RESECTION  POST-OPERATIVE DIAGNOSIS:  LOOP ILEOSTOMY FOR FECAL DIVERSION, RECTAL CANCER STATUS POST LOW ANTERIOR RESECTION  PROCEDURE:   ILEOSTOMY LOOP TAKEDOWN TAP BLOCK - RIGHT EXAM UNDER ANESTHESIA  SURGEON:  Adin Hector, MD  ASSISTANT: OR Staff   ANESTHESIA:   local and general  EBL:  Total I/O In: 3500 [I.V.:1213; IV Piggyback:100] Out: 15 [Blood:15]  Delay start of Pharmacological VTE agent (>24hrs) due to surgical blood loss or risk of bleeding:  no  DRAINS: none   SPECIMEN:  Loop ileostomy  DISPOSITION OF SPECIMEN:  PATHOLOGY  COUNTS:  YES  PLAN OF CARE: Admit to inpatient   PATIENT DISPOSITION:  PACU - hemodynamically stable.  INDICATION: Pleasant patient status post low anterior resection with diverting loop ileostomy to protect the very distal anastomosis.  The patient has recovered from that surgery with the anastomosis well-healed.  It was felt safe to have the loop ileostomy taken down.  I discussed the procedure with the patient:  The anatomy & physiology of the digestive tract was discussed.  The pathophysiology was discussed.  Possibility of remaining with an ostomy permanently was discussed.  I offered ostomy takedown.  Laparoscopic & open techniques were discussed.   Risks such as bleeding, infection, abscess, leak, reoperation, possible re-ostomy, injury to other organs, hernia, heart  attack, death, and other risks were discussed.   I noted a good likelihood this will help address the problem.  Goals of post-operative recovery were discussed as well.  We will work to minimize complications.  Questions were answered.  The patient expresses understanding & wishes to proceed with surgery.  OR FINDINGS: Moderate adhesions to abdominal wall and subcutaneous tissues especially at fascia.  Side-to-side stapled ileoileal anastomosis.  Intact circular EEA staple line 4cm from anal verge without stricture.  Normal anatomy.  DESCRIPTION:   Informed consent was confirmed.   The patient received IV antibiotics & underwent general anesthesia without any difficulty.  Foley catheter was sterilely placed. SCDs were active during the entire case.  I did an examination under anesthesia transrectally.  Sphincter tone intact and normal.  Circular staple line easily felt about 4 cm from the sphincters.  I felt no significant abnormalities.  I detected no contraindication with proceeding with ostomy takedown.  The abdomen was prepped and draped in a sterile fashion.  A surgical timeout confirmed our plan.  She had ontracted her ileostomy site but was pink and viable.  I made a circular incision around the loop ileostomy.  I got into the subcutaneous tissues.  I used careful focused right angle dissection and sharp dissection.  Some focused cautery dissection as well.  That helped to free adhesions to the subcutaneous tisses & fascia.  .  Ended up extending the incision transversely.  Came through most of the tissue sharply around the loop of ileum to get down to the fascia.  So carefully freed adhesions to the fascia and muscle.  Gradually, I was able to enter into the peritoneum focally.  I did a gentle finger sweep.  Gradually came around circumferentially and freed the loop of ileum from remaining adhesions to the abdominal wall.  We were able eviscerate some bowel proximally and distally.  I ended up  transecting moderate volume of the loop ileostomy given adhesions until softer bowel more deeply that I could bring up and eviscerate.  I did a side-to-side stapled anastomosis using a 75 GIA.  Silk stitch placed at the crotch of the anastomosis. I used a TX stapler to staple off the common defect and resect the remaining ileum, most of it that involve the intra-abdominal ostomy component given the adhesions.  I took the mesentery with clamps and silk ties.  I closed the mesenteric defect using interrupted silk stitches.  Using the redundant mesentery to help cover and protect the TX staple line.  The anastomosis looked healthy and viable.   Hemostasis good.  We returned the anastomosis into the abdominal cavity.  Finger sweep circumferentially noted no adhesions.  It rested well.  We changed gloves and instruments.  Irrigated copiously into the wound and fascia.  I reapproximated the fascia transversely using #1 PDS stitches.   It ended up coming together somewhat obliquely but primarily transversely.  I irrigated into the subcutaneous tissues.   I excised some skin to have a more natural biconcave wound.  Because of her abdominal thickness, I did to a layer of interrupted 2-0 Vicryl deep sutures at Scarpa's fascia to help close the wound and dead space down.  I reapproximated the skin using 4-0 Monocryl stitch running centrally.  I left the corners open.  I packed the corners with anitbiotic soaked umbilical tape for wicks.  Sterile dressing was applied.  Patient was extubated and is stable in the recovery room.  I discussed operative findings, updated the patient's status, discussed probable steps to recovery, and gave postoperative recommendations to the patient's spouse.  Recommendations were made.  Questions were answered.  He expressed understanding & appreciation.   Adin Hector, M.D., F.A.C.S. Gastrointestinal and Minimally Invasive Surgery [facility] [address]  [place], [place] [address] [phone] Main / Paging

## 2018-07-10 NOTE — Anesthesia Procedure Notes (Signed)
Procedure Name: Intubation Date/Time: 07/10/2018 8:36 AM Performed by: Victoriano Lain, CRNA Pre-anesthesia Checklist: Patient identified, Suction available, Emergency Drugs available, Patient being monitored and Timeout performed Patient Re-evaluated:Patient Re-evaluated prior to induction Oxygen Delivery Method: Circle system utilized Preoxygenation: Pre-oxygenation with 100% oxygen Induction Type: IV induction Ventilation: Mask ventilation without difficulty Laryngoscope Size: Mac and 4 Grade View: Grade I Tube type: Oral Tube size: 7.5 mm Number of attempts: 1 Airway Equipment and Method: Stylet Placement Confirmation: ETT inserted through vocal cords under direct vision,  positive ETCO2 and breath sounds checked- equal and bilateral Secured at: 22 cm Tube secured with: Tape Dental Injury: Teeth and Oropharynx as per pre-operative assessment

## 2018-07-10 NOTE — H&P (Signed)
Samantha Lutz DOB: August 06, 1950   History of Present Illness Adin Hector MD; 02/14/2018 3:41 PM) The patient is a 68 year old female who presents with colorectal cancer. Note for "Colorectal cancer": ` ` `  Patient Care Team: Lance Sell, NP as PCP - General (Nurse Practitioner) Delsa Bern, MD (Obstetrics and Gynecology) Melissa Noon, [place] (Optometry) [name], MD as Consulting Physician (General Surgery) Danis, Kirke Corin, MD as Consulting Physician (Gastroenterology) Ladell Pier, MD as Consulting Physician (Oncology) Kyung Rudd, MD as Consulting Physician (Radiation Oncology)  The patient returns s/p robotic low anterior resection with protective diverting loop ileostomy 12/21/2017.    The patient returns to clinic after surgery, gradually improving. He was recommend she get post-adjuvant IV chemotherapy. She declined. They compromised with oral Xeloda capecitabine chemotherapy. She did her first round/cycle. They think that she is supposed to get 5 cycles. 2 weeks oral Xeloda, 1 week off; I think. They've been working closely with wound ostomy clinic. It is hard to keep the bag on for more than 12 hours. They switch to a silicone pace bag which seems to be helping. Some burning and itching but nothing as bad as before. She no longer has diarrhea and is emptying the bag about 3-4 times a day. Her appetite and energy level are improved overall.  Pathology: Diagnosis 1. Colon, segmental resection for tumor, rectosigmoid - RESIDUAL INVASIVE ADENOCARCINOMA, WELL-DIFFERENTIATED, SPANNING 2 CM. - TUMOR INVADES THROUGH MUSCULARIS PROPRIA. - RESECTION MARGINS ARE NEGATIVE. - LYMPHOVASCULAR INVASION IDENTIFIED. 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 - SEE ONCOLOGY TABLE. 2. Colon, resection margin (donut), rectosigmoid distal ring - final margin - BENIGN COLONIC MUCOSA. - NO DYSPLASIA OR  MALIGNANCY. Microscopic Comment 1. COLON AND RECTUM: Resection, Including Transanal Disk Excision of Rectal Neoplasms Procedure: Segmental resection rectosigmoid colon. Tumor Site: Middle third of rectum. Tumor Size: 2 cm. Macroscopic Tumor Perforation: Not identified. Histologic Type: Invasive adenocarcinoma. Histologic Grade: G1, well-differentiated. Tumor Extension: Through muscularis propria. Margins: Negative. Treatment Effect: Present (TRS 1). Lymphovascular Invasion: Present. Perineural Invasion: Not identified. Tumor Deposits: Not identified. Regional Lymph Nodes: Number of Lymph Nodes Involved: Micrometastasis in one node with treatment effect. Number of Lymph Nodes Examined: 23 Pathologic Stage Classification (pTNM, AJCC 8th Edition): ypT3, ypN1a Ancillary Studies: Insufficient tumor for MSI. MMR will be ordered. Representative tumor block: 1D 1 of 3 Supplemental copy SUPPLEMENTAL for DONALDA, JOB ([id number]) Microscopic Comment(continued) Comments: None. (v4.0.1.0) [name] MD Pathologist, Electronic Signature (Case signed 12/24/2017) Specimen Valerye Kobus and Clinical Information Specimen(s) Obtained: 1. Colon, segmental resection for tumor, rectosigmoid 2. Colon, resection margin (donut), rectosigmoid distal ring - final margin Specimen Clinical Information 1. rectal cancer (kp) Cleaster Shiffer 1. Specimen: Rectosigmoid. Specimen integrity: Intact. Specimen length: 29 cm. Mesorectal intactness: Complete. Tumor location: Anterior wall of middle third of rectum. Tumor size: There is a 1 x 0.8 cm stellate centrally granular dark red mucosal umbilication with underlying indurated tissue. Percent of bowel circumference involved: 10 to 15%. Tumor distance to margins: Proximal: 24 cm. Distal: 4 cm. Radial (posterior ascending, posterior descending; lateral and posterior mid-rectum; and entire lower 1/3 rectum): 3 cm to the perirectal soft tissue margin. Macroscopic  extent of tumor invasion: The wall beneath the mucosal umbilication is indurated, thickened up to 0.8 cm. No residual mass is identified. Total presumed lymph nodes: Found are twenty-six possible lymph nodes ranging from 0.1 to 0.5 cm. Extramural satellite tumor nodules: None. Mucosal polyp(s): None. Additional findings: None. Block summary: A=  proximal margin. B= distal margin. C-F= stellate mucosal umbilication and underlying indurated wall entirely submitted. G= five possible nodes. H= four possible nodes. I= four possible nodes. J= four possible nodes. K= four possible nodes. L= five possible nodes. Total 12 blocks. 2. Received in formalin is a 1.1 cm in length and 1.5 cm in diameter ring of tan pink soft soft mucosa with underlying embedded metallic staples in the wall. Representative sections in one block. (SW:gt, 12/24/17) 2 of 3 Supplemental copy SUPPLEMENTAL for SAVREEN, GEBHARDT ([id number]) Disclaimer Some of these immunohistochemical stains may have been developed and the performance characteristics determined by [facility]. Some may not have been cleared or approved by the U.S. Food and Drug Administration. The FDA has determined that such clearance or approval is not necessary. This test is used for clinical purposes. It should not be regarded as investigational or for research. This laboratory is certified under the [place] (CLIA-88) as qualified to perform high complexity clinical laboratory testing. Report signed out from the following location(s) Technical Component was performed at [facility]. [address],[place] [address].[id number]., Interpretation was performed at [facility] [address], [place], [place] [address]. CLIA #: [id number],` ` `  12/21/2017  12:52 PM  PATIENT: Samantha Lutz 68 y.o. female  Patient Care  Team: Lance Sell, NP as PCP - General (Nurse Practitioner) Delsa Bern, MD (Obstetrics and Gynecology) Melissa Noon, [place] (Optometry) [name], MD as Consulting Physician (General Surgery) Danis, Kirke Corin, MD as Consulting Physician (Gastroenterology) Ladell Pier, MD as Consulting Physician (Oncology) Kyung Rudd, MD as Consulting Physician (Radiation Oncology)  PRE-OPERATIVE DIAGNOSIS: Rectal cancer  POST-OPERATIVE DIAGNOSIS: Rectal cancer  PROCEDURE:  XI ROBOTIC ASSISTED LOWER ANTERIOR RECTOSIGMOID RESECTION Evaluation of colon and rectal perfusion using firefly immunofluorescence DIVERTING LOOP ILEOSTOMY RIGID PROCTOSCOPY  SURGEON: Adin Hector, MD  ASSISTANT: Nadeen Landau, MD An experienced assistant was required given the standard of surgical care given the complexity of the case. This assistant was needed for exposure, dissection, suctioning, retraction, instrument exchange, etc.  ANESTHESIA: local and general  EBL: Total I/O In: 1000 [I.V.:1000] Out: 550 [Urine:350; Blood:200]  Delay start of Pharmacological VTE agent (>24hrs) due to surgical blood loss or risk of bleeding: no  DRAINS: 67 [place] Blake drain goes from right mid abdomen down into the pelvis around the low anastomosis  SPECIMEN:  Rectosigmoid colon.  Distal rectal anastomotic ring  DISPOSITION OF SPECIMEN: PATHOLOGY  COUNTS: YES  PLAN OF CARE: Admit to inpatient  PATIENT DISPOSITION: PACU - hemodynamically stable.  INDICATION:   Pleasant morbidly obese female found to have bulky mid rectal tumor. Staged as T3. Underwent neoadjuvant chemoradiation therapy. Ready for resection. I recommended segmental resection:  The anatomy & physiology of the digestive tract was discussed. The pathophysiology of the rectal pathology was discussed. Natural history risks without surgery was discussed. I worked to give an overview of the  disease and the frequent need to have multispecialty involvement. I feel the risks of no intervention will lead to serious problems that outweigh the operative risks; therefore, I recommended a partial proctocolectomy to remove the pathology. Minimally Invasive (Robotic/Laparoscopic) & open techniques were discussed. We will work to preserve anal & pelvic floor function without sacrificing cure.  Risks such as bleeding, infection, abscess, leak, reoperation, possible temporary or permanent ostomy, hernia, stroke, heart attack, death, and other risks were discussed. I noted a good likelihood this will help address  the problem. Goals of post-operative recovery were discussed as well. We will work to minimize complications. Educational information was available as well. Questions were answered. The patient expresses understanding & wishes to proceed with surgery.  OR FINDINGS:  Patient had circumferential scarring in the mid rectum just distal to the low peritoneal reflection around 7-8 cm from the sphincters consistent with treated cancer. Tattooing proximal and distal to it. Resected.  No obvious metastatic disease on visceral parietal peritoneum or liver.  The anastomosis rests 4-5 cm from the anal verge by rigid proctoscopy.  DESCRIPTION:  Informed consent was confirmed. The patient underwent general anaesthesia without difficulty. The patient was positioned appropriately. 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 VTE prevention in place. The patient's abdomen was clipped, prepped, & draped in a sterile fashion. Surgical timeout confirmed our plan.  The patient was positioned in reverse Trendelenburg. Abdominal entry was gained using Varess technique along the left subcostal ridge. I induced carbon dioxide insufflation. End-tidal CO2 not elevated. Insufflated to 15 mm. Port carefully placed in upper abdomen. Camera inspection  revealed no injury. Extra ports were carefully placed under direct laparoscopic visualization. I freed some greater omentum off the infraumbilical midline. Assured hemostasis. What omentum had some he is to the right lower quadrant. The patient was carefully positioned. The Intuitive daVinci robot was carefully docked with camera & instruments carefully placed.  We freed adhesions of greater omentum to the right lower quadrant and ileocecal region to help reduce it into the upper abdomen. The patient had torturous sigmoid colon. I mobilized the left colon somewhat lateral to medial to help straighten out better. With this I can elevate the dilated sigmoid colon and better expose the sigmoid mesentery and mesorectum. I scored the base of peritoneum of the medial side of the mesentery of the left colon from the ligament of Treitz to the peritoneal reflection of the mid rectum. I elevated the sigmoid mesentery and entered into the retro-mesenteric plane. We were able to identify the left ureter and gonadal vessels. We kept those posterior within the retroperitoneum and elevated the left colon mesentery off that. I did isolate the inferior mesenteric artery (IMA) pedicle but did not ligate it yet. I continued distally and got into the avascular plane posterior to the mesorectum. This allowed me to help mobilize the rectum as well by freeing the mesorectum off the sacrum. I mobilized the peritoneal coverings towards the peritoneal reflection on both the right and left sides of the rectum. I stayed away from the right and left ureters. I kept the lateral vascular pedicles to the rectum intact.  I skeletonized the lymph nodes off the inferior mesenteric artery pedicle. I went down to its takeoff from the aorta. I isolated the inferior mesenteric vein off of the ligament of Treitz just cephalad to that as well. After confirming the left ureter was out of the way, I went ahead and ligated the  inferior mesenteric artery pedicle just near its takeoff from the aorta. I did ligate the inferior mesenteric vein in a similar fashion. We ensured hemostasis. I skeletonized the mesorectum at the junction at the proximal rectum for the distal point of resection. That I could better mobilize the left colon mesentery off the retroperitoneum. I mobilized the left colon in a lateral to medial fashion off the line of Toldt up towards the splenic flexure to ensure good mobilization of the remaining left colon to reach into the pelvis.   I  then focused on mesorectal excision. Elevated the rectosigmoid anteriorly and follow the presacral plane to resect the mesorectum off his attachments to that in a posterior fashion. I came around anteriorly around the peritoneal coverings on the lateral pedicles in the anterior peritoneal reflection which was quite distal. Encountered tattooing in that region. Mobilized the anterior mid and distal rectum off the rectovaginal septum. Went again posteriorly and mobilized the rectum off the sacrum coccyx and pelvic floor for good posterior mobilization. I then did a digital and later rigid proctoscopy scopic evaluation to confirm where the lesion was in a stellate strictured region of the distal mid rectum. I transected the lateral pedicles right and left distal to this region. I transected through the mesorectum at the mid/distal junction was able to skeletonize it fully. Confirmed that I had good distal margin of at least 3 cm. I transected at the distal rectum with a robotic stapler firing. 2 firings with one final one on the corner. With that I can reduce the rectosigmoid out of the pelvis. Confirm I had good mesorectal excision and the rest of the pelvis was clear.  I chose a region at the descending/sigmoid junction that was soft and easily reached down to the rectal stump. I transected the mesentery of the colon radially to preserve remaining colon blood  supply. I had anesthesia intravenously infuse firefly. Could see good green immunofluorescence rapidly fill up the colon up to the transection point at the distal descending colon. Could also see the remaining rectal stump had excellent perfusion as well.  I evaluated the ileo-cecal region. Mobilize the cecum in a lateral medial fashion although it had somewhat of a bascule already. Chose a region in the distal ileum about a foot proximal to the ileocecal valve to easily reached over to the left iliac crest. Seem like a good region for a protecting loop ileostomy in this morbidly obese woman. Placed a red rubber catheter through the mesentery in this region and clamped that off.  I created an extraction incision through a small Pfannenstiel incision in the suprapubic region. Placed a wound protector. I was able to eviscerate the rectosigmoid and descending colon out the wound. I clamped the colon proximal to this area using a reusable pursestringer device. Passed a 2-0 Keith needle. I transected at the descending/sigmoid junction with a scalpel. I got healthy bleeding mucosa. We sent the rectosigmoid colon specimen off to go to pathology. We sized the colon orifice. I chose a 33 EEA anvil stapler system. I reinforced the prolene pursestring with interrupted silk suture. I placed the anvil to the open end of the proximal remaining colon and closed around it using the pursestring. We did copious irrigation with crystalloid solution. Hemostasis was good. The distal end of the remaining colon easily reached down to the rectal stump, therefore, splenic flexure mobilization was not needed.   Dr. Dema Severin scrubbed down and did gentle anal dilation and advanced the EEA stapler up the rectal stump. The spike was brought out at the provimal end of the rectal stump under direct visualization. I attached the anvil of the proximal colon the spike of the stapler. Anvil was tightened down and held  clamped for 60 seconds. The EEA stapler was fired and held clamped for 30 seconds. The stapler was released & removed. We noted 2 excellent anastomotic rings. Blue stitch is in the proximal ring. Dr Dema Severin did rigid proctoscopy noted the anastomosis was at 4-5 cm from the anal verge consistent with the distal  rectum. Confirmed digitally as well. Given her morbid obesity with chemoradiation therapy preoperatively and low anastomosis, I felt the patient required diverting loop ileostomy to protect to the low colorectal anastomosis. Dr. Dema Severin agreed. We did a final irrigation of antibiotic solution (900 mg clindamycin/240 mg gentamicin in a liter of crystalloid) & held that for the pelvic air leak test . The rectum was insufflated the rectum while clamping the colon proximal to that anastomosis. There was a negative air leak test. There was no tension of mesentery or bowel at the anastomosis. Tissues looked viable. Ureters & bowel uninjured. The anastomosis looked healthy. Hemostasis was good.  Drain placed through the right paramedian 5 mm cyst port down into the pelvis. Secured to the skin with a 2-0 blue Prolene suture. I created a 3 cm circular defect in the right upper paramedian region at the premarked ileostomy site. Excised the conus subcutaneous tissues and she had quite a thick abdominal wall. Came through the anterior rectus fascia transversely. Split through the right rectus muscle and posterior rectus fascia and peritoneum vertically. I was able to get a red rubber catheter up through the wound to bring up the loop ileostomy. Confirmed proper orientation with the proximal end superior. No twisting or torsion.   Ports & wound protector removed. We changed gloves & redraped the patient per colon SSI prevention protocol. We aspirated the antibiotic irrigation. Hemostasis was good. Sterile unused instruments were used from this point. I closed the skin at the port sites using  Monocryl stitch and sterile dressing. I closed the extraction wound using a 0 Vicryl vertical peritoneal closure and a #1 PDS transverse anterior rectal fascial closure like a small Pfannenstiel closure. I closed the skin with some interrupted Monocryl stitches. I placed antibiotic-soaked wicks into the closure at the corners x2. I placed sterile dressings.  Matured the loop ileostomy in the right upper quadrant. I used 3-0 Vicryl Interrupted sutures. I opened up the ileum eccentrically so that the distal limb was flush with the skin. Proximal limb rolled over to get a good Brooke ileostomy. I telescoped out some ileum out such that she had a 35 mm ileostomy rosebud. Some mild ischemia but viable. Circumferential dermal/seromuscular sutures made for good eventrated Brooke ileostomy. Proximal limb is superior and about 80% of the ostomy.. Distal limb decompressed and 6:00 inferiorly. Ileostomy appliance placed.  Patient extubated go to the PACU recovery room. I had discussed postop care with the patient in detail the office. The skin with the patient and her extended family in the holding area. Instructions are written. I discussed operative findings, updated the patient's status, discussed probable steps to recovery, and gave postoperative recommendations to the patient's spouse. Recommendations were made. Questions were answered. He expressed understanding & appreciation.  Adin Hector, M.D., F.A.C.S. Gastrointestinal and Minimally Invasive Surgery [facility] 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 Owens Shark, [place]; 02/14/2018 10:59 AM) No Known Drug Allergies [08/21/2017]: Allergies Reconciled  Medication History (Tanisha A.  Owens Shark, RMA; 02/14/2018 11:00 AM) Levothyroxine Sodium (50MCG Tablet, Oral) Active. Multivitamin & Mineral (Oral) Active. Xeloda (500MG Tablet, Oral) Active. Medications Reconciled  Social History Adin Hector, MD; 02/14/2018 11:09 AM) Caffeine use Carbonated beverages, Coffee, Tea. No alcohol use No drug use Tobacco use Never smoker.  Family History Adin Hector, MD; 02/14/2018 11:09 AM) Colon Polyps Father. Diabetes Mellitus Mother. Kidney Disease Sister. Respiratory Condition Mother.  Pregnancy / Birth History Adin Hector, MD; 02/14/2018 11:09 AM) Age at menarche 39 years. Age of menopause 11-50 Contraceptive History Oral contraceptives. Gravida 2 Maternal age 31-25 Para 2  Other Problems Adin Hector, MD; 02/14/2018 11:09 AM) Arthritis Colon Cancer Thyroid Disease    Vitals (Tanisha A. Brown RMA; 02/14/2018 10:59 AM) 02/14/2018 10:59 AM Weight: 215.4 lb Height: 63in Body Surface Area: 2 m Body Mass Index: 38.16 kg/m  Temp.: 98.80F  BP: 138/84 (Sitting, Left Arm, Standard)   07/10/2018 BP (!) 177/78    Pulse 92    Temp 98.2 F (36.8 C) (Oral)    Resp 16    Ht _0  (1.6 m)    Wt 84.4 kg    SpO2 98%    BMI 32.97 kg/m     Physical Exam Adin Hector MD; 02/14/2018 11:23 AM)  General Mental Status-Alert. General Appearance-Not in acute distress. Voice-Normal. Note: Relaxed. Nontoxic.  Integumentary Global Assessment Normal Exam - Distribution of scalp and body hair is normal. General Characteristics Overall Skin Surface - no  rashes and no suspicious lesions.  Head and Neck Head-normocephalic, atraumatic with no lesions or palpable masses. Face Global Assessment - atraumatic, no absence of expression. Neck Global Assessment - no abnormal movements, no decreased range of motion. Trachea-midline. Thyroid Gland Characteristics - non-tender.  Eye Eyeball - Left-Extraocular movements intact, No Nystagmus. Eyeball - Right-Extraocular movements intact, No Nystagmus. Upper Eyelid - Left-No Cyanotic. Upper Eyelid - Right-No Cyanotic.  Chest and Lung Exam Inspection Accessory muscles - No use of accessory muscles in breathing.  Abdomen Note: Ileostomy at right upper quadrant. More flat but pink at base. Green thick succus and back. Bag staying on for now. No major rash.  Incisions with normal healing ridges. No active bleeding. No cellulitis. No guarding/rebound tenderness.   Peripheral Vascular Upper Extremity Inspection - Left - Not Gangrenous, No Petechiae. Right - Not Gangrenous, No Petechiae.  Neurologic Neurologic evaluation reveals -normal attention span and ability to concentrate, able to name objects and repeat phrases. Appropriate fund of knowledge and normal coordination.  Neuropsychiatric Mental status exam performed with findings of-able to articulate well with normal speech/language, rate, volume and coherence and no evidence of hallucinations, delusions, obsessions or homicidal/suicidal ideation. Orientation-oriented X3.  Musculoskeletal Global Assessment Gait and Station - normal gait and station.  Lymphatic General Lymphatics Description - No Generalized lymphadenopathy.    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 Recovering status post robotic low anterior resection with low anastomosis and protective loop ileostomy .  She is stage III lymph node positive disease. She would  benefit from post-adjuvant chemotherapy. We initially had arranged for my partner to do a Port-A-Cath last Wednesday but was canceled due to ostomy and travel issues. They compromised and she is on oral capecitabine/Xeloda. First treatment done.  Would plan ileostomy takedown 3-4 weeks after completing oral chemotherapy. We'll try and post  Patient will require a contrast enema to make sure there is no leak at her low colorectal anastomosis. We'll tentatively plan  that next month  I would like to see her in the office for a quick examination and discussion of surgery just before ileostomy takedown. NO Bowel prep  Current Plans Pt Education - [facility] Colorectal Cancer (AT): discussed with patient and provided information. 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  His been hard for the back to stay sealed with some leaking around it. The rosebud is coming back out again which helps. They switch to a silicone type wafer which helps as well.  Her diarrhea has resolved and her bowels are more slow which is helpful.  Continue working to help troubleshoot her ileostomy bag.  Plan ileostomy takedown 3-4 weeks after completing chemotherapy. We will recheck out and I asked the patient to reach out on getting a specific and day on her oral Xeloda.  Continue working with [employer] for troubleshooting. Continue with wound ostomy clinic in [facility].  Current Plans Pt Education - [facility] Ostomy [facility] (Maebelle Sulton): discussed with patient and provided information. You are being scheduled for surgery- Our schedulers will call you.  You should hear from our office's scheduling department within 5 working days about the location, date, and time of surgery. We try to make accommodations for patient's preferences in scheduling surgery, but sometimes the OR schedule or the  surgeon's schedule prevents [place] from making those accommodations.  If you have not heard from our office [phone]) in 5 working days, call the office and ask for your surgeon's nurse.  If you have other questions about your diagnosis, plan, or surgery, call the office and ask for your surgeon's nurse.  Written instructions provided The anatomy & physiology of the digestive tract was discussed. The pathophysiology was discussed. Possibility of remaining with an ostomy permanently was discussed. I offered ostomy takedown. Laparoscopic & open techniques were discussed.  Risks such as bleeding, infection, abscess, leak, reoperation, possible re-ostomy, injury to other organs, hernia, heart attack, death, and other risks were discussed. I noted a good likelihood this will help address the problem. Goals of post-operative recovery were discussed as well. We will work to minimize complications. Questions were answered. The patient expresses understanding & wishes to proceed with surgery.  Adin Hector, MD, FACS, MASCRS Gastrointestinal and Minimally Invasive Surgery    [address], [place], [place] [address] [phone] Main / Paging [phone] Fax

## 2018-07-10 NOTE — Discharge Instructions (Signed)
SURGERY: POST OP INSTRUCTIONS (Surgery for small bowel obstruction, colon resection, etc)   ######################################################################  EAT Gradually transition to a high fiber diet with a fiber supplement over the next few days after discharge  WALK Walk an hour a day.  Control your pain to do that.    CONTROL PAIN Control pain so that you can walk, sleep, tolerate sneezing/coughing, go up/down stairs.  HAVE A BOWEL MOVEMENT DAILY Keep your bowels regular to avoid problems.  OK to try a laxative to override constipation.  OK to use an antidairrheal to slow down diarrhea.  Call if not better after 2 tries  CALL IF YOU HAVE PROBLEMS/CONCERNS Call if you are still struggling despite following these instructions. Call if you have concerns not answered by these instructions  ######################################################################   DIET Follow a light diet the first few days at home.  Start with a bland diet such as soups, liquids, starchy foods, low fat foods, etc.  If you feel full, bloated, or constipated, stay on a ful liquid or pureed/blenderized diet for a few days until you feel better and no longer constipated. Be sure to drink plenty of fluids every day to avoid getting dehydrated (feeling dizzy, not urinating, etc.). Gradually add a fiber supplement to your diet over the next week.  Gradually get back to a regular solid diet.  Avoid fast food or heavy meals the first week as you are more likely to get nauseated. It is expected for your digestive tract to need a few months to get back to normal.  It is common for your bowel movements and stools to be irregular.  You will have occasional bloating and cramping that should eventually fade away.  Until you are eating solid food normally, off all pain medications, and back to regular activities; your bowels will not be normal. Focus on eating a low-fat, high fiber diet the rest of your life  (See Getting to Good Bowel Health, below).  CARE of your INCISION or WOUND It is good for closed incision and even open wounds to be washed every day.  Shower every day.  Short baths are fine.  Wash the incisions and wounds clean with soap & water.    If you have a closed incision(s), wash the incision with soap & water every day.  You may leave closed incisions open to air if it is dry.   You may cover the incision with clean gauze & replace it after your daily shower for comfort. If you have skin tapes (Steristrips) or skin glue (Dermabond) on your incision, leave them in place.  They will fall off on their own like a scab.  You may trim any edges that curl up with clean scissors.  If you have staples, set up an appointment for them to be removed in the office in 10 days after surgery.  If you have a drain, wash around the skin exit site with soap & water and place a new dressing of gauze or band aid around the skin every day.  Keep the drain site clean & dry.    If you have an open wound with packing, see wound care instructions.  In general, it is encouraged that you remove your dressing and packing, shower with soap & water, and replace your dressing once a day.  Pack the wound with clean gauze moistened with normal (0.9%) saline to keep the wound moist & uninfected.  Pressure on the dressing for 30 minutes will stop most wound   bleeding.  Eventually your body will heal & pull the open wound closed over the next few months.  Raw open wounds will occasionally bleed or secrete yellow drainage until it heals closed.  Drain sites will drain a little until the drain is removed.  Even closed incisions can have mild bleeding or drainage the first few days until the skin edges scab over & seal.   If you have an open wound with a wound vac, see wound vac care instructions.     ACTIVITIES as tolerated Start light daily activities --- self-care, walking, climbing stairs-- beginning the day after surgery.   Gradually increase activities as tolerated.  Control your pain to be active.  Stop when you are tired.  Ideally, walk several times a day, eventually an hour a day.   Most people are back to most day-to-day activities in a few weeks.  It takes 4-8 weeks to get back to unrestricted, intense activity. If you can walk 30 minutes without difficulty, it is safe to try more intense activity such as jogging, treadmill, bicycling, low-impact aerobics, swimming, etc. Save the most intensive and strenuous activity for last (Usually 4-8 weeks after surgery) such as sit-ups, heavy lifting, contact sports, etc.  Refrain from any intense heavy lifting or straining until you are off narcotics for pain control.  You will have off days, but things should improve week-by-week. DO NOT PUSH THROUGH PAIN.  Let pain be your guide: If it hurts to do something, don't do it.  Pain is your body warning you to avoid that activity for another week until the pain goes down. You may drive when you are no longer taking narcotic prescription pain medication, you can comfortably wear a seatbelt, and you can safely make sudden turns/stops to protect yourself without hesitating due to pain. You may have sexual intercourse when it is comfortable. If it hurts to do something, stop.  MEDICATIONS Take your usually prescribed home medications unless otherwise directed.   Blood thinners:  Usually you can restart any strong blood thinners after the second postoperative day.  It is OK to take aspirin right away.     If you are on strong blood thinners (warfarin/Coumadin, Plavix, Xerelto, Eliquis, Pradaxa, etc), discuss with your surgeon, medicine PCP, and/or cardiologist for instructions on when to restart the blood thinner & if blood monitoring is needed (PT/INR blood check, etc).     PAIN CONTROL Pain after surgery or related to activity is often due to strain/injury to muscle, tendon, nerves and/or incisions.  This pain is usually  short-term and will improve in a few months.  To help speed the process of healing and to get back to regular activity more quickly, DO THE FOLLOWING THINGS TOGETHER: 1. Increase activity gradually.  DO NOT PUSH THROUGH PAIN 2. Use Ice and/or Heat 3. Try Gentle Massage and/or Stretching 4. Take over the counter pain medication 5. Take Narcotic prescription pain medication for more severe pain  Good pain control = faster recovery.  It is better to take more medicine to be more active than to stay in bed all day to avoid medications. 1.  Increase activity gradually Avoid heavy lifting at first, then increase to lifting as tolerated over the next 6 weeks. Do not "push through" the pain.  Listen to your body and avoid positions and maneuvers than reproduce the pain.  Wait a few days before trying something more intense Walking an hour a day is encouraged to help your body recover faster   and more safely.  Start slowly and stop when getting sore.  If you can walk 30 minutes without stopping or pain, you can try more intense activity (running, jogging, aerobics, cycling, swimming, treadmill, sex, sports, weightlifting, etc.) Remember: If it hurts to do it, then don't do it! 2. Use Ice and/or Heat You will have swelling and bruising around the incisions.  This will take several weeks to resolve. Ice packs or heating pads (6-8 times a day, 30-60 minutes at a time) will help sooth soreness & bruising. Some people prefer to use ice alone, heat alone, or alternate between ice & heat.  Experiment and see what works best for you.  Consider trying ice for the first few days to help decrease swelling and bruising; then, switch to heat to help relax sore spots and speed recovery. Shower every day.  Short baths are fine.  It feels good!  Keep the incisions and wounds clean with soap & water.   3. Try Gentle Massage and/or Stretching Massage at the area of pain many times a day Stop if you feel pain - do not  overdo it 4. Take over the counter pain medication This helps the muscle and nerve tissues become less irritable and calm down faster Choose ONE of the following over-the-counter anti-inflammatory medications: Acetaminophen 500mg tabs (Tylenol) 1-2 pills with every meal and just before bedtime (avoid if you have liver problems or if you have acetaminophen in you narcotic prescription) Naproxen 220mg tabs (ex. Aleve, Naprosyn) 1-2 pills twice a day (avoid if you have kidney, stomach, IBD, or bleeding problems) Ibuprofen 200mg tabs (ex. Advil, Motrin) 3-4 pills with every meal and just before bedtime (avoid if you have kidney, stomach, IBD, or bleeding problems) Take with food/snack several times a day as directed for at least 2 weeks to help keep pain / soreness down & more manageable. 5. Take Narcotic prescription pain medication for more severe pain A prescription for strong pain control is often given to you upon discharge (for example: oxycodone/Percocet, hydrocodone/Norco/Vicodin, or tramadol/Ultram) Take your pain medication as prescribed. Be mindful that most narcotic prescriptions contain Tylenol (acetaminophen) as well - avoid taking too much Tylenol. If you are having problems/concerns with the prescription medicine (does not control pain, nausea, vomiting, rash, itching, etc.), please call us [phone] to see if we need to switch you to a different pain medicine that will work better for you and/or control your side effects better. If you need a refill on your pain medication, you must call the office before 4 pm and on weekdays only.  By federal law, prescriptions for narcotics cannot be called into a pharmacy.  They must be filled out on paper & picked up from our office by the patient or authorized caretaker.  Prescriptions cannot be filled after 4 pm nor on weekends.    WHEN TO CALL US [phone] Severe uncontrolled or worsening pain  Fever over 101 F (38.5 C) Concerns with  the incision: Worsening pain, redness, rash/hives, swelling, bleeding, or drainage Reactions / problems with new medications (itching, rash, hives, nausea, etc.) Nausea and/or vomiting Difficulty urinating Difficulty breathing Worsening fatigue, dizziness, lightheadedness, blurred vision Other concerns If you are not getting better after two weeks or are noticing you are getting worse, contact our office [phone] for further advice.  We may need to adjust your medications, re-evaluate you in the office, send you to the emergency room, or see what other things we can do to help. The   clinic staff is available to answer your questions during regular business hours (8:30am-5pm).  Please don't hesitate to call and ask to speak to one of our nurses for clinical concerns.    A surgeon from [facility] is always on call at the hospitals 24 hours/day If you have a medical emergency, go to the nearest emergency room or call 911.  FOLLOW UP in our office One the day of your discharge from the hospital (or the next business weekday), please call [facility] to set up or confirm an appointment to see your surgeon in the office for a follow-up appointment.  Usually it is 2-3 weeks after your surgery.   If you have skin staples at your incision(s), let the office know so we can set up a time in the office for the nurse to remove them (usually around 10 days after surgery). Make sure that you call for appointments the day of discharge (or the next business weekday) from the hospital to ensure a convenient appointment time. IF YOU HAVE DISABILITY OR FAMILY LEAVE FORMS, BRING THEM TO THE OFFICE FOR PROCESSING.  DO NOT GIVE THEM TO YOUR DOCTOR.  [facility] [address], [address], [place], [place]  [address] ? [phone] - Main [phone] - Toll Free,  [phone] - Fax [email or web address]  GETTING TO GOOD BOWEL HEALTH. It is  expected for your digestive tract to need a few months to get back to normal.  It is common for your bowel movements and stools to be irregular.  You will have occasional bloating and cramping that should eventually fade away.  Until you are eating solid food normally, off all pain medications, and back to regular activities; your bowels will not be normal.   Avoiding constipation The goal: ONE SOFT BOWEL MOVEMENT A DAY!    Drink plenty of fluids.  Choose water first. TAKE A FIBER SUPPLEMENT EVERY DAY THE REST OF YOUR LIFE During your first week back home, gradually add back a fiber supplement every day Experiment which form you can tolerate.   There are many forms such as powders, tablets, wafers, gummies, etc Psyllium bran (Metamucil), methylcellulose (Citrucel), Miralax or Glycolax, Benefiber, Flax Seed.  Adjust the dose week-by-week (1/2 dose/day to 6 doses a day) until you are moving your bowels 1-2 times a day.  Cut back the dose or try a different fiber product if it is giving you problems such as diarrhea or bloating. Sometimes a laxative is needed to help jump-start bowels if constipated until the fiber supplement can help regulate your bowels.  If you are tolerating eating & you are farting, it is okay to try a gentle laxative such as double dose MiraLax, prune juice, or Milk of Magnesia.  Avoid using laxatives too often. Stool softeners can sometimes help counteract the constipating effects of narcotic pain medicines.  It can also cause diarrhea, so avoid using for too long. If you are still constipated despite taking fiber daily, eating solids, and a few doses of laxatives, call our office. Controlling diarrhea Try drinking liquids and eating bland foods for a few days to avoid stressing your intestines further. Avoid dairy products (especially milk & ice cream) for a short time.  The intestines often can lose the ability to digest lactose when stressed. Avoid foods that cause gassiness or  bloating.  Typical foods include beans and other legumes, cabbage, broccoli, and dairy foods.  Avoid greasy, spicy, fast foods.  Every person has   some sensitivity to other foods, so listen to your body and avoid those foods that trigger problems for you. Probiotics (such as active yogurt, Align, etc) may help repopulate the intestines and colon with normal bacteria and calm down a sensitive digestive tract Adding a fiber supplement gradually can help thicken stools by absorbing excess fluid and retrain the intestines to act more normally.  Slowly increase the dose over a few weeks.  Too much fiber too soon can backfire and cause cramping & bloating. It is okay to try and slow down diarrhea with a few doses of antidiarrheal medicines.   Bismuth subsalicylate (ex. Kayopectate, Pepto Bismol) for a few doses can help control diarrhea.  Avoid if pregnant.   Loperamide (Imodium) can slow down diarrhea.  Start with one tablet (2mg) first.  Avoid if you are having fevers or severe pain.  ILEOSTOMY PATIENTS WILL HAVE CHRONIC DIARRHEA since their colon is not in use.    Drink plenty of liquids.  You will need to drink even more glasses of water/liquid a day to avoid getting dehydrated. Record output from your ileostomy.  Expect to empty the bag every 3-4 hours at first.  Most people with a permanent ileostomy empty their bag 4-6 times at the least.   Use antidiarrheal medicine (especially Imodium) several times a day to avoid getting dehydrated.  Start with a dose at bedtime & breakfast.  Adjust up or down as needed.  Increase antidiarrheal medications as directed to avoid emptying the bag more than 8 times a day (every 3 hours). Work with your wound ostomy nurse to learn care for your ostomy.  See ostomy care instructions. TROUBLESHOOTING IRREGULAR BOWELS 1) Start with a soft & bland diet. No spicy, greasy, or fried foods.  2) Avoid gluten/wheat or dairy products from diet to see if symptoms improve. 3) Miralax  17gm or flax seed mixed in 8oz. water or juice-daily. May use 2-4 times a day as needed. 4) Gas-X, Phazyme, etc. as needed for gas & bloating.  5) Prilosec (omeprazole) over-the-counter as needed 6)  Consider probiotics (Align, Activa, etc) to help calm the bowels down  Call your doctor if you are getting worse or not getting better.  Sometimes further testing (cultures, endoscopy, X-ray studies, CT scans, bloodwork, etc.) may be needed to help diagnose and treat the cause of the diarrhea. [facility] [address], [address], [place], [place]  [address] [phone] - Main.    [phone]  - Toll Free.   [phone] - Fax [email or web address]   Pelvic floor muscle training exercises ("Kegels") can help strengthen the muscles under the uterus, bladder, and bowel (large intestine). They can help both men and women who have problems with urine leakage or bowel control.  A pelvic floor muscle training exercise is like pretending that you have to urinate, and then holding it. You relax and tighten the muscles that control urine flow. It's important to find the right muscles to tighten.  The next time you have to urinate, start to go and then stop. Feel the muscles in your vagina, bladder, or anus get tight and move up. These are the pelvic floor muscles. If you feel them tighten, you've done the exercise right. If you are still not sure whether you are tightening the right muscles, keep in mind that all of the muscles of the pelvic floor relax and contract at the same time. Because these muscles control the bladder, rectum, and vagina, the following tips   may help: Women: Insert a finger into your vagina. Tighten the muscles as if you are holding in your urine, then let go. You should feel the muscles tighten and move up and down.  Men: Insert a finger into your rectum. Tighten the muscles as if you are holding in your urine, then let go. You should feel the  muscles tighten and move up and down. These are the same muscles you would tighten if you were trying to prevent yourself from passing gas.  It is very important that you keep the following muscles relaxed while doing pelvic floor muscle training exercises: Abdominal  Buttocks (the deeper, anal sphincter muscle should contract)  Thigh   A woman can also strengthen these muscles by using a vaginal cone, which is a weighted device that is inserted into the vagina. Then you try to tighten the pelvic floor muscles to hold the device in place. If you are unsure whether you are doing the pelvic floor muscle training correctly, you can use biofeedback and electrical stimulation to help find the correct muscle group to work. Biofeedback is a method of positive reinforcement. Electrodes are placed on the abdomen and along the anal area. Some therapists place a sensor in the vagina in women or anus in men to monitor the contraction of pelvic floor muscles.  A monitor will display a graph showing which muscles are contracting and which are at rest. The therapist can help find the right muscles for performing pelvic floor muscle training exercises.   PERFORMING PELVIC FLOOR EXERCISES: 1. Begin by emptying your bladder. 2. Tighten the pelvic floor muscles and hold for a count of 10. 3. Relax the muscles completely for a count of 10. 4. Do 10 repititions, 3 to 5 times a day (morning, afternoon, and night). You can do these exercises at any time and any place. Most people prefer to do the exercises while lying down or sitting in a chair. After 4 - 6 weeks, most people notice some improvement. It may take as long as 3 months to see a major change. After a couple of weeks, you can also try doing a single pelvic floor contraction at times when you are likely to leak (for example, while getting out of a chair). A word of caution: Some people feel that they can speed up the progress by increasing the number of  repetitions and the frequency of exercises. However, over-exercising can instead cause muscle fatigue and increase urine leakage. If you feel any discomfort in your abdomen or back while doing these exercises, you are probably doing them wrong. Breathe deeply and relax your body when you are doing these exercises. Make sure you are not tightening your stomach, thigh, buttock, or chest muscles. When done the right way, pelvic floor muscle exercises have been shown to be very effective at improving urinary continence.  Pelvic Floor Pain / Incontinence  Do you suffer from pelvic pain or incontinence? Do you have pain in the pelvis, low back or hips that is associated with sitting, walking, urination or intercourse? Have you experienced leaking of urine or feces when coughing, sneezing or laughing? Do you have pain in the pelvic area associated with cancer?  These are conditions that are common with pelvic floor muscle dysfunction. Over time, due to stress, scar tissue, surgeries and the natural course of aging, our muscles may become weak or overstressed and can spasm. This can lead to pain, weakness, incontinence or decreased quality of life.  Men and   women with pelvic floor dysfunction frequently describe:  A "falling out" feeling. Pain or burning in the abdomen, tailbone or perineal area. Constipation or bowel elimination problems or difficulty initiating urination. Unresolved low back or hip pain. Frequency and urgency when going to the bathroom. Leaking of urine or feces. Pain with intercourse.  [email or web address]  To make a referral or for more information about [facility], call  [place] ([place]) - [phone] [place] ([place]) - [phone] [place] ([facility]) - [phone]  

## 2018-07-10 NOTE — Transfer of Care (Signed)
Immediate Anesthesia Transfer of Care Note  Patient: Samantha Lutz  Procedure(s) Performed: ILEOSTOMY LOOP TAKEDOWN WITH TAP BLOCK (N/A Abdomen) EXAM UNDER ANESTHESIA (N/A Rectum)  Patient Location: PACU  Anesthesia Type:General  Level of Consciousness: awake, alert , oriented and patient cooperative  Airway & Oxygen Therapy: Patient Spontanous Breathing and Patient connected to face mask oxygen  Post-op Assessment: Report given to RN, Post -op Vital signs reviewed and stable and Patient moving all extremities  Post vital signs: Reviewed and stable  Last Vitals:  Vitals Value Taken Time  BP 164/79 07/10/2018 10:58 AM  Temp    Pulse 113 07/10/2018 10:59 AM  Resp 8 07/10/2018 10:59 AM  SpO2 100 % 07/10/2018 10:59 AM  Vitals shown include unvalidated device data.  Last Pain:  Vitals:   07/10/18 0706  TempSrc:   PainSc: 0-No pain         Complications: No apparent anesthesia complications

## 2018-07-10 NOTE — Anesthesia Preprocedure Evaluation (Addendum)
Anesthesia Evaluation  Patient identified by MRN, date of birth, ID band Patient awake    Reviewed: Allergy & Precautions, NPO status , Patient's Chart, lab work & pertinent test results  Airway Mallampati: I  TM Distance: >3 FB Neck ROM: Full    Dental  (+) Teeth Intact, Dental Advisory Given   Pulmonary neg pulmonary ROS,    breath sounds clear to auscultation       Cardiovascular hypertension,  Rhythm:Regular Rate:Normal     Neuro/Psych negative neurological ROS     GI/Hepatic negative GI ROS, Neg liver ROS,   Endo/Other  Hypothyroidism   Renal/GU negative Renal ROS     Musculoskeletal  (+) Arthritis ,   Abdominal (+) + obese,   Peds  Hematology negative hematology ROS (+)   Anesthesia Other Findings   Reproductive/Obstetrics                           Anesthesia Physical Anesthesia Plan  ASA: III  Anesthesia Plan: General   Post-op Pain Management:    Induction: Intravenous  PONV Risk Score and Plan: 4 or greater and Ondansetron, Dexamethasone, Midazolam and Scopolamine patch - Pre-op  Airway Management Planned: Oral ETT  Additional Equipment: None  Intra-op Plan:   Post-operative Plan: Extubation in OR  Informed Consent: I have reviewed the patients History and Physical, chart, labs and discussed the procedure including the risks, benefits and alternatives for the proposed anesthesia with the patient or authorized representative who has indicated his/her understanding and acceptance.     Dental advisory given  Plan Discussed with: CRNA  Anesthesia Plan Comments:        Anesthesia Quick Evaluation

## 2018-07-11 ENCOUNTER — Encounter (HOSPITAL_COMMUNITY): Payer: Self-pay | Admitting: Surgery

## 2018-07-11 ENCOUNTER — Ambulatory Visit: Payer: Medicare Other | Admitting: Oncology

## 2018-07-11 ENCOUNTER — Other Ambulatory Visit: Payer: Medicare Other

## 2018-07-11 LAB — CBC
HCT: 32.4 % — ABNORMAL LOW (ref 36.0–46.0)
Hemoglobin: 10.4 g/dL — ABNORMAL LOW (ref 12.0–15.0)
MCH: 29.6 pg (ref 26.0–34.0)
MCHC: 32.1 g/dL (ref 30.0–36.0)
MCV: 92.3 fL (ref 80.0–100.0)
Platelets: 166 10*3/uL (ref 150–400)
RBC: 3.51 MIL/uL — ABNORMAL LOW (ref 3.87–5.11)
RDW: 15.8 % — ABNORMAL HIGH (ref 11.5–15.5)
WBC: 15.6 10*3/uL — ABNORMAL HIGH (ref 4.0–10.5)
nRBC: 0 % (ref 0.0–0.2)

## 2018-07-11 LAB — BASIC METABOLIC PANEL
Anion gap: 14 (ref 5–15)
BUN: 15 mg/dL (ref 8–23)
CO2: 21 mmol/L — ABNORMAL LOW (ref 22–32)
Calcium: 8.8 mg/dL — ABNORMAL LOW (ref 8.9–10.3)
Chloride: 102 mmol/L (ref 98–111)
Creatinine, Ser: 1.2 mg/dL — ABNORMAL HIGH (ref 0.44–1.00)
GFR calc Af Amer: 53 mL/min — ABNORMAL LOW (ref >60)
GFR calc non Af Amer: 46 mL/min — ABNORMAL LOW (ref >60)
Glucose, Bld: 103 mg/dL — ABNORMAL HIGH (ref 70–99)
Potassium: 3.9 mmol/L (ref 3.5–5.1)
Sodium: 137 mmol/L (ref 135–145)

## 2018-07-11 LAB — MAGNESIUM: Magnesium: 2 mg/dL (ref 1.7–2.4)

## 2018-07-11 NOTE — Anesthesia Postprocedure Evaluation (Signed)
Anesthesia Post Note  Patient: Samantha Lutz  Procedure(s) Performed: ILEOSTOMY LOOP TAKEDOWN WITH TAP BLOCK (N/A Abdomen) EXAM UNDER ANESTHESIA (N/A Rectum)     Patient location during evaluation: PACU Anesthesia Type: General Level of consciousness: awake and alert Pain management: pain level controlled Vital Signs Assessment: post-procedure vital signs reviewed and stable Respiratory status: spontaneous breathing, nonlabored ventilation, respiratory function stable and patient connected to nasal cannula oxygen Cardiovascular status: blood pressure returned to baseline and stable Postop Assessment: no apparent nausea or vomiting Anesthetic complications: no    Last Vitals:  Vitals:   07/11/18 0558 07/11/18 0918  BP: (!) 113/55 (!) 128/54  Pulse: 67 70  Resp: 16 18  Temp: 36.8 C 36.4 C  SpO2: 99% 96%    Last Pain:  Vitals:   07/11/18 1000  TempSrc:   PainSc: 0-No pain                 Effie Berkshire

## 2018-07-11 NOTE — Progress Notes (Addendum)
Samantha Lutz [id number] 11/02/1950  CARE TEAM:  PCP: Lance Sell, NP  Outpatient Care Team: Patient Care Team: Lance Sell, NP as PCP - General (Nurse Practitioner) Delsa Bern, MD (Obstetrics and Gynecology) Melissa Noon, [place] (Optometry) [name], MD as Consulting Physician (General Surgery) Danis, Kirke Corin, MD as Consulting Physician (Gastroenterology) Ladell Pier, MD as Consulting Physician (Oncology) Kyung Rudd, MD as Consulting Physician (Radiation Oncology)  Inpatient Treatment Team: Treatment Team: Attending Provider: [name], MD   Problem List:   Principal Problem:   Status post ileostomy takedown 07/10/2018 Active Problems:   Hypertension   Morbid obesity with body mass index of 40.0-49.9 (HCC)   Hypothyroid   Rectal cancer ypT3ypN1a (1/23 LN) s/p neoadj chemoXRT, robotic LAR resection & diverting loop ileostomy 12/21/2017   1 Day Post-Op  07/10/2018  POST-OPERATIVE DIAGNOSIS:   LOOP ILEOSTOMY FOR FECAL DIVERSION RECTAL CANCER STATUS POST LOW ANTERIOR RESECTION  PROCEDURE:   ILEOSTOMY LOOP TAKEDOWN TAP BLOCK - RIGHT EXAM UNDER ANESTHESIA  SURGEON:  Adin Hector, MD    Assessment  Recovering well so far  [facility] Stay = 1 days)  Plan:  -Advance diet per enhanced recovery protocol -Follow off IV fluids -Bowel regimen -Hypothyroidism-Synthroid -History of hypertension.  Not on home medicines.  PRN for now -VTE prophylaxis- SCDs, etc -mobilize as tolerated to help recovery  20 minutes spent in review, evaluation, examination, counseling, and coordination of care.  More than 50% of that time was spent in counseling.  07/11/2018    Subjective: (Chief complaint)  Tolerating thicker liquids.  Had some bloody mucus and bowel movements.  No incontinence.  Minimal pain or discomfort.  Walking.  Objective:  Vital signs:  Vitals:   07/10/18 1522 07/10/18 2159 07/11/18 0222 07/11/18 0558   BP: (!) 119/55 102/67 (!) 122/51 (!) 113/55  Pulse: 76 67 67 67  Resp: 16 16 16 16   Temp: 97.6 F (36.4 C) 98.3 F (36.8 C) 97.6 F (36.4 C) 98.3 F (36.8 C)  TempSrc: Oral Oral Oral Oral  SpO2: 96% 98% 98% 99%  Weight:      Height:        Last BM Date: 07/09/18  Intake/Output   Yesterday:  06/10 0701 - 06/11 0700 In: 2900.3 [P.O.:600; I.V.:2100.3; IV Piggyback:200] Out: 4098 [Urine:1200; Blood:15] This shift:  No intake/output data recorded.  Bowel function:  Flatus: YES  BM:  YES  Drain: (No drain)   Physical Exam:  General: Pt awake/alert/oriented x4 in no acute distress.  Walking in the room calm and relaxed.  No guarding or splinting. Eyes: PERRL, normal EOM.  Sclera clear.  No icterus Neuro: CN II-XII intact w/o focal sensory/motor deficits. Lymph: No head/neck/groin lymphadenopathy Psych:  No delerium/psychosis/paranoia HENT: Normocephalic, Mucus membranes moist.  No thrush Neck: Supple, No tracheal deviation Chest: No chest wall pain w good excursion CV:  Pulses intact.  Regular rhythm MS: Normal AROM mjr joints.  No obvious deformity  Abdomen: Soft.  Nondistended.  Nontender.  No evidence of peritonitis.  No incarcerated hernias.  Ext:   No deformity.  No mjr edema.  No cyanosis Skin: No petechiae / purpura  Results:   Cultures: Recent Results (from the past 720 hour(s))  SARS Coronavirus 2 (CEPHEID - Performed in [place] hospital lab), Hosp Order     Status: None   Collection Time: 07/08/18 12:05 PM   Specimen: Nasopharyngeal Swab  Result Value Ref Range Status   SARS Coronavirus 2 NEGATIVE NEGATIVE  Final    Comment: (NOTE) If result is NEGATIVE SARS-CoV-2 target nucleic acids are NOT DETECTED. The SARS-CoV-2 RNA is generally detectable in upper and lower  respiratory specimens during the acute phase of infection. The lowest  concentration of SARS-CoV-2 viral copies this assay can detect is 250  copies / mL. A negative result does not  preclude SARS-CoV-2 infection  and should not be used as the sole basis for treatment or other  patient management decisions.  A negative result may occur with  improper specimen collection / handling, submission of specimen other  than nasopharyngeal swab, presence of viral mutation(s) within the  areas targeted by this assay, and inadequate number of viral copies  (<250 copies / mL). A negative result must be combined with clinical  observations, patient history, and epidemiological information. If result is POSITIVE SARS-CoV-2 target nucleic acids are DETECTED. The SARS-CoV-2 RNA is generally detectable in upper and lower  respiratory specimens dur ing the acute phase of infection.  Positive  results are indicative of active infection with SARS-CoV-2.  Clinical  correlation with patient history and other diagnostic information is  necessary to determine patient infection status.  Positive results do  not rule out bacterial infection or co-infection with other viruses. If result is PRESUMPTIVE POSTIVE SARS-CoV-2 nucleic acids MAY BE PRESENT.   A presumptive positive result was obtained on the submitted specimen  and confirmed on repeat testing.  While 2019 novel coronavirus  (SARS-CoV-2) nucleic acids may be present in the submitted sample  additional confirmatory testing may be necessary for epidemiological  and / or clinical management purposes  to differentiate between  SARS-CoV-2 and other Sarbecovirus currently known to infect humans.  If clinically indicated additional testing with an alternate test  methodology [id number]) is advised. The SARS-CoV-2 RNA is generally  detectable in upper and lower respiratory sp ecimens during the acute  phase of infection. The expected result is Negative. Fact Sheet for Patients:  StrictlyIdeas.no Fact Sheet for Healthcare Providers: BankingDealers.co.za This test is not yet approved or cleared by  the [place] FDA and has been authorized for detection and/or diagnosis of SARS-CoV-2 by FDA under an Emergency Use Authorization (EUA).  This EUA will remain in effect (meaning this test can be used) for the duration of the COVID-19 declaration under Section 564(b)(1) of the Act, 21 U.S.C. section 360bbb-3(b)(1), unless the authorization is terminated or revoked sooner. Performed at [facility], [place] [address] [place], [place] [address]     Labs: No results found for this or any previous visit (from the past 48 hour(s)).  Imaging / Studies: No results found.  Medications / Allergies: per chart  Antibiotics: Anti-infectives (From admission, onward)   Start     Dose/Rate Route Frequency Ordered Stop   07/10/18 2000  cefoTEtan (CEFOTAN) 2 g in sodium chloride 0.9 % 100 mL IVPB     2 g 200 mL/hr over 30 Minutes Intravenous Every 12 hours 07/10/18 1231 07/10/18 2055   07/10/18 0700  cefoTEtan (CEFOTAN) 2 g in sodium chloride 0.9 % 100 mL IVPB     2 g 200 mL/hr over 30 Minutes Intravenous On call to O.R. 07/10/18 1601 07/10/18 0908        Note: Portions of this report may have been transcribed using voice recognition software. Every effort was made to ensure accuracy; however, inadvertent computerized transcription errors may be present.   Any transcriptional errors that result from this process are unintentional.  Adin Hector, MD, FACS, MASCRS Gastrointestinal and Minimally Invasive Surgery    [address], [place], [place] [address] [phone] Main / Paging [phone] Fax

## 2018-07-12 MED ORDER — TRAMADOL HCL 50 MG PO TABS: 50.0000 mg | ORAL_TABLET | Freq: Four times a day (QID) | ORAL | 0 refills | Status: DC | PRN

## 2018-07-12 NOTE — Care Management Important Message (Signed)
Important Message  Patient Details IM Letter given to Kathrin Greathouse SW to present to the Patient Name: Samantha Lutz MRN: [id number] Date of Birth: 18-Aug-1950   Medicare Important Message Given:  Yes    Kerin Salen 07/12/2018, 10:14 AM

## 2018-07-12 NOTE — Progress Notes (Signed)
Pt alert, oriented, tolerating diet.  D/C instructions given, all questions answered.  Pt d/cd home. 

## 2018-07-12 NOTE — Discharge Summary (Signed)
Physician Discharge Summary    Patient ID: Samantha Lutz MRN: [id number] DOB/AGE: [birth year]  68 y.o.  Patient Care Team: Lance Sell, NP as PCP - General (Nurse Practitioner) Delsa Bern, MD (Obstetrics and Gynecology) Melissa Noon, [place] (Optometry) [name], MD as Consulting Physician (General Surgery) Danis, Kirke Corin, MD as Consulting Physician (Gastroenterology) Ladell Pier, MD as Consulting Physician (Oncology) Kyung Rudd, MD as Consulting Physician (Radiation Oncology)  Admit date: 07/10/2018  Discharge date: 07/12/2018  Hospital Stay = 2 days    Discharge Diagnoses:  Principal Problem:   Status post ileostomy takedown 07/10/2018 Active Problems:   Hypertension   Morbid obesity with body mass index of 40.0-49.9 (HCC)   Hypothyroid   Rectal cancer ypT3ypN1a (1/23 LN) s/p neoadj chemoXRT, robotic LAR resection & diverting loop ileostomy 12/21/2017   2 Days Post-Op  07/10/2018  POST-OPERATIVE DIAGNOSIS:  LOOP ILEOSTOMY FOR FECAL DIVERSION, RECTAL CANCER STATUS POST LOW ANTERIOR RESECTION  PROCEDURE:   ILEOSTOMY LOOP TAKEDOWN TAP BLOCK - RIGHT EXAM UNDER ANESTHESIA  SURGEON:  Adin Hector, MD  Consults: None  Hospital Course:   The patient underwent the surgery above.  Postoperatively, the patient gradually mobilized and advanced to a solid diet.  Pain and other symptoms were treated aggressively.    By the time of discharge, the patient was walking well the hallways, eating food, having flatus.  Pain was well-controlled on an oral medications.  Based on meeting discharge criteria and continuing to recover, I felt it was safe for the patient to be discharged from the hospital to further recover with close followup. Postoperative recommendations were discussed in detail.  They are written as well.  Discharged Condition: good  Discharge Exam: Blood pressure (!) 142/63, pulse 63, temperature 98 F (36.7 C), temperature source Oral,  resp. rate 16, height 5\' 3"  (1.6 m), weight 84.6 kg, SpO2 95 %.  General: Pt awake/alert/oriented x4 in No acute distress Eyes: PERRL, normal EOM.  Sclera clear.  No icterus Neuro: CN II-XII intact w/o focal sensory/motor deficits. Lymph: No head/neck/groin lymphadenopathy Psych:  No delerium/psychosis/paranoia HENT: Normocephalic, Mucus membranes moist.  No thrush Neck: Supple, No tracheal deviation Chest: No chest wall pain w good excursion CV:  Pulses intact.  Regular rhythm MS: Normal AROM mjr joints.  No obvious deformity Abdomen: Soft.  Nondistended.  Mildly tender at incisions only.  No evidence of peritonitis.  No incarcerated hernias. Ext:  SCDs BLE.  No mjr edema.  No cyanosis Skin: No petechiae / purpura   Disposition:   Follow-up Information    [name], MD. Schedule an appointment as soon as possible for a visit in 3 weeks.   Specialty: General Surgery Why: To follow up after your operation, To follow up after your hospital stay Contact information: [address] [place] [address] [phone]               Allergies as of 07/12/2018      Reactions   Epinephrine Palpitations   Losartan Hives      Medication List    TAKE these medications   levothyroxine 50 MCG tablet Commonly known as: SYNTHROID Take 1 tablet (50 mcg total) by mouth daily. Needs visit for future refills.   traMADol 50 MG tablet Commonly known as: ULTRAM Take 1-2 tablets (50-100 mg total) by mouth every 6 (six) hours as needed for severe pain (mild pain).            Discharge Care Instructions  (  From admission, onward)         Start     Ordered   07/12/18 0000  Discharge wound care:    Comments: On Monday morning, 6/15, remove all dressings and shoelace wicks off of incisions.   It is good for closed incisions and even open wounds to be washed every day.  Shower every day.  Short baths are fine.  Wash the incisions and wounds clean with soap & water.   If they are dry, you can leave them open to air.  If you wish, you can cover with gauze or bandage     You may leave closed incisions open to air if it is dry.   You may cover the incision with clean gauze & replace it after your daily shower for comfort.   07/12/18 0801          Significant Diagnostic Studies:  Results for orders placed or performed during the hospital encounter of 07/10/18 (from the past 72 hour(s))  Basic metabolic panel     Status: Abnormal   Collection Time: 07/11/18  9:28 AM  Result Value Ref Range   Sodium 137 135 - 145 mmol/L   Potassium 3.9 3.5 - 5.1 mmol/L   Chloride 102 98 - 111 mmol/L   CO2 21 (L) 22 - 32 mmol/L   Glucose, Bld 103 (H) 70 - 99 mg/dL   BUN 15 8 - 23 mg/dL   Creatinine, Ser 1.20 (H) 0.44 - 1.00 mg/dL   Calcium 8.8 (L) 8.9 - 10.3 mg/dL   GFR calc non Af Amer 46 (L) >60 mL/min   GFR calc Af Amer 53 (L) >60 mL/min   Anion gap 14 5 - 15    Comment: Performed at [facility], [place] [address], [place], [place] [address]  CBC     Status: Abnormal   Collection Time: 07/11/18  9:28 AM  Result Value Ref Range   WBC 15.6 (H) 4.0 - 10.5 K/uL   RBC 3.51 (L) 3.87 - 5.11 MIL/uL   Hemoglobin 10.4 (L) 12.0 - 15.0 g/dL   HCT 32.4 (L) 36.0 - 46.0 %   MCV 92.3 80.0 - 100.0 fL   MCH 29.6 26.0 - 34.0 pg   MCHC 32.1 30.0 - 36.0 g/dL   RDW 15.8 (H) 11.5 - 15.5 %   Platelets 166 150 - 400 K/uL   nRBC 0.0 0.0 - 0.2 %    Comment: Performed at [facility], [place] [address], [place], [place] [address]  Magnesium     Status: None   Collection Time: 07/11/18  9:28 AM  Result Value Ref Range   Magnesium 2.0 1.7 - 2.4 mg/dL    Comment: Performed at [facility], [place] [address] [place], [place] [address]    No results found.  Past Medical History:  Diagnosis Date   Anemia    remote hx.   Arthritis    Bi lat knee   Fatty liver    ultrasound December 2012   Hypothyroid 01/16/2013 dx    Irregular heartbeat    PER PATIENT REPORT; ONSET 20+YEARS AGO SAW CARDIOLOGY AT THE TIME C/O "ITS BEAT REALLY FAST FOR A MINUTE AND THEN STOPPED " DENIES ANY OTHER CARDIAC SX';  REPORTS CARDIO DID ECHO WHICH WAS NEGATIVE;  NOW COMES AND GOES ;    Lichen sclerosus    steroids as needed   Temporary low platelet count (HCC)    SEE LAST LABS IN  EPIC    Past Surgical History:  Procedure Laterality Date   [place] N/A 07/10/2018   Procedure: ILEOSTOMY LOOP TAKEDOWN WITH TAP BLOCK;  Surgeon: [name], MD;  Location: [facility];  Service: General;  Laterality: N/A;   OSTOMY N/A 12/21/2017   Procedure: DIVERTING LOOP OSTOMY;  Surgeon: [name], MD;  Location: [facility];  Service: General;  Laterality: N/A;   PROCTOSCOPY N/A 12/21/2017   Procedure: RIGID PROCTOSCOPY;  Surgeon: [name], MD;  Location: [facility];  Service: General;  Laterality: N/A;   XI ROBOTIC ASSISTED LOWER ANTERIOR RESECTION N/A 12/21/2017   Procedure: XI ROBOTIC ASSISTED LOWER ANTERIOR RESECTION;  Surgeon: [name], MD;  Location: [facility];  Service: General;  Laterality: N/A;    Social History   Socioeconomic History   Marital status: Married    Spouse name: Not on file   Number of children: Not on file   Years of education: Not on file   Highest education level: Not on file  Occupational History   Not on file  Social Needs   Financial resource strain: Not on file   Food insecurity    Worry: Not on file    Inability: Not on file   Transportation needs    Medical: Not on file    Non-medical: Not on file  Tobacco Use   Smoking status: Never Smoker   Smokeless tobacco: Never Used  Substance and Sexual Activity   Alcohol use: No   Drug use: No   Sexual activity: Not on file  Lifestyle   Physical activity    Days per week: Not on file    Minutes per session: Not on file   Stress: Not on file  Relationships   Social  connections    Talks on phone: Not on file    Gets together: Not on file    Attends religious service: Not on file    Active member of club or organization: Not on file    Attends meetings of clubs or organizations: Not on file    Relationship status: Not on file   Intimate partner violence    Fear of current or ex partner: Not on file    Emotionally abused: Not on file    Physically abused: Not on file    Forced sexual activity: Not on file  Other Topics Concern   Not on file  Social History Narrative   Not on file    Family History  Problem Relation Age of Onset   Diabetes Mother    Hypertension Mother    COPD Mother    Glaucoma Father 23   Stroke Maternal Grandmother 52   Hypertension Maternal Grandmother    Rheum arthritis Sister 45   Hypertension Daughter    Breast cancer Cousin    Colon cancer Neg Hx    Esophageal cancer Neg Hx    Liver cancer Neg Hx    Pancreatic cancer Neg Hx    Stomach cancer Neg Hx    Rectal cancer Neg Hx     Current Facility-Administered Medications  Medication Dose Route Frequency Provider Last Rate Last Dose   acetaminophen (TYLENOL) tablet 1,000 mg  1,000 mg Oral Lajuana Ripple, MD   1,000 mg at 07/12/18 0554   alum & mag hydroxide-simeth (MAALOX/MYLANTA) 200-200-20 MG/5ML suspension 30 mL  30 mL Oral Q6H PRN [name], MD       alvimopan (  ENTEREG) capsule 12 mg  12 mg Oral BID [name], MD   12 mg at 07/11/18 2116   diphenhydrAMINE (BENADRYL) 12.5 MG/5ML elixir 12.5 mg  12.5 mg Oral Q6H PRN [name], MD       Or   diphenhydrAMINE (BENADRYL) injection 12.5 mg  12.5 mg Intravenous Q6H PRN [name], MD       enoxaparin (LOVENOX) injection 40 mg  40 mg Subcutaneous Q24H [name], MD   40 mg at 07/11/18 0859   feeding supplement (ENSURE SURGERY) liquid 237 mL  237 mL Oral BID BM [name], MD   237 mL at 07/11/18 1457   gabapentin (NEURONTIN) capsule 300 mg  300 mg Oral BID [name], MD   300 mg at 07/11/18 2116   hydrALAZINE (APRESOLINE) injection 10 mg  10 mg Intravenous Q2H PRN [name], MD       HYDROmorphone (DILAUDID) injection 0.5-2 mg  0.5-2 mg Intravenous Q4H PRN [name], MD   1 mg at 07/10/18 1318   lactated ringers bolus 1,000 mL  1,000 mL Intravenous Q8H PRN [name], MD       levothyroxine (SYNTHROID) tablet 50 mcg  50 mcg Oral Daily [name], MD   50 mcg at 07/12/18 0554   lip balm (CARMEX) ointment 1 application  1 application Topical BID [name], MD   1 application at 67/89/38 2116   magic mouthwash  15 mL Oral QID PRN [name], MD       metoCLOPramide (REGLAN) injection 10 mg  10 mg Intravenous Q6H PRN [name], MD       metoprolol tartrate (LOPRESSOR) injection 5 mg  5 mg Intravenous Q6H PRN [name], MD       ondansetron [facility]) tablet 4 mg  4 mg Oral Q6H PRN [name], MD   4 mg at 07/11/18 1246   Or   ondansetron (ZOFRAN) injection 4 mg  4 mg Intravenous Q6H PRN [name], MD       prochlorperazine (COMPAZINE) tablet 10 mg  10 mg Oral Q6H PRN [name], MD       Or   prochlorperazine (COMPAZINE) injection 5-10 mg  5-10 mg Intravenous Q6H PRN [name], MD       psyllium (HYDROCIL/METAMUCIL) packet 1 packet  1 packet Oral Daily [name], MD   1 packet at 07/11/18 1017   saccharomyces boulardii (FLORASTOR) capsule 250 mg  250 mg Oral BID [name], MD   250 mg at 07/11/18 2116   traMADol (ULTRAM) tablet 50-100 mg  50-100 mg Oral Q6H PRN [name], MD   100 mg at 07/11/18 1656     Allergies  Allergen Reactions   Epinephrine Palpitations   Losartan Hives    Signed: Morton Peters, MD, FACS, MASCRS Gastrointestinal and Minimally Invasive Surgery    [address], [place], [place] [address] [phone] Main / Paging [phone] Fax   07/12/2018, 7:57 AM

## 2018-08-06 ENCOUNTER — Inpatient Hospital Stay: Payer: Medicare Other

## 2018-08-06 ENCOUNTER — Inpatient Hospital Stay: Payer: Medicare Other | Attending: Oncology | Admitting: Oncology

## 2018-08-06 ENCOUNTER — Telehealth: Payer: Self-pay

## 2018-08-06 ENCOUNTER — Telehealth: Payer: Self-pay | Admitting: Oncology

## 2018-08-06 ENCOUNTER — Other Ambulatory Visit: Payer: Self-pay

## 2018-08-06 VITALS — BP 135/62 | HR 90 | Temp 98.3°F | Resp 18 | Ht 63.0 in | Wt 186.4 lb

## 2018-08-06 DIAGNOSIS — Z9221 Personal history of antineoplastic chemotherapy: Secondary | ICD-10-CM

## 2018-08-06 DIAGNOSIS — E039 Hypothyroidism, unspecified: Secondary | ICD-10-CM

## 2018-08-06 DIAGNOSIS — D649 Anemia, unspecified: Secondary | ICD-10-CM | POA: Diagnosis not present

## 2018-08-06 DIAGNOSIS — C20 Malignant neoplasm of rectum: Secondary | ICD-10-CM | POA: Diagnosis present

## 2018-08-06 DIAGNOSIS — Z79899 Other long term (current) drug therapy: Secondary | ICD-10-CM

## 2018-08-06 DIAGNOSIS — Z923 Personal history of irradiation: Secondary | ICD-10-CM | POA: Diagnosis not present

## 2018-08-06 DIAGNOSIS — D696 Thrombocytopenia, unspecified: Secondary | ICD-10-CM

## 2018-08-06 LAB — BASIC METABOLIC PANEL - CANCER CENTER ONLY
Anion gap: 9 (ref 5–15)
BUN: 6 mg/dL — ABNORMAL LOW (ref 8–23)
CO2: 25 mmol/L (ref 22–32)
Calcium: 8 mg/dL — ABNORMAL LOW (ref 8.9–10.3)
Chloride: 108 mmol/L (ref 98–111)
Creatinine: 0.81 mg/dL (ref 0.44–1.00)
GFR, Est AFR Am: >60 mL/min (ref >60)
GFR, Estimated: >60 mL/min (ref >60)
Glucose, Bld: 89 mg/dL (ref 70–99)
Potassium: 3.7 mmol/L (ref 3.5–5.1)
Sodium: 142 mmol/L (ref 135–145)

## 2018-08-06 LAB — CBC WITH DIFFERENTIAL (CANCER CENTER ONLY)
Abs Immature Granulocytes: 0.02 10*3/uL (ref 0.00–0.07)
Basophils Absolute: 0 10*3/uL (ref 0.0–0.1)
Basophils Relative: 0 %
Eosinophils Absolute: 0.1 10*3/uL (ref 0.0–0.5)
Eosinophils Relative: 3 %
HCT: 28.3 % — ABNORMAL LOW (ref 36.0–46.0)
Hemoglobin: 9.4 g/dL — ABNORMAL LOW (ref 12.0–15.0)
Immature Granulocytes: 1 %
Lymphocytes Relative: 14 %
Lymphs Abs: 0.6 10*3/uL — ABNORMAL LOW (ref 0.7–4.0)
MCH: 29.8 pg (ref 26.0–34.0)
MCHC: 33.2 g/dL (ref 30.0–36.0)
MCV: 89.8 fL (ref 80.0–100.0)
Monocytes Absolute: 0.5 10*3/uL (ref 0.1–1.0)
Monocytes Relative: 12 %
Neutro Abs: 2.8 10*3/uL (ref 1.7–7.7)
Neutrophils Relative %: 70 %
Platelet Count: 101 10*3/uL — ABNORMAL LOW (ref 150–400)
RBC: 3.15 MIL/uL — ABNORMAL LOW (ref 3.87–5.11)
RDW: 15.4 % (ref 11.5–15.5)
WBC Count: 4 10*3/uL (ref 4.0–10.5)
nRBC: 0 % (ref 0.0–0.2)

## 2018-08-06 LAB — SAVE SMEAR(SSMR), FOR PROVIDER SLIDE REVIEW

## 2018-08-06 LAB — CEA (IN HOUSE-CHCC): CEA (CHCC-In House): <1 ng/mL (ref 0.00–5.00)

## 2018-08-06 NOTE — Telephone Encounter (Signed)
TC to pt per Dr Benay Spice to let her knowCEA is normal, follow-up as scheduled. Pt verbalized understanding. No further problems or concerns at this time.

## 2018-08-06 NOTE — Telephone Encounter (Signed)
Scheduled appt per 7/7 los. Printed calendar and avs.   Gave patient contrast and the number to central radiology.

## 2018-08-06 NOTE — Progress Notes (Signed)
  [place] OFFICE PROGRESS NOTE   Diagnosis: Rectal cancer  INTERVAL HISTORY:   Ms. Birenbaum returns for scheduled visit.  She underwent ileostomy takedown on 07/10/2018.  She has irregular bowel habits.  She otherwise feels well.  Objective:  Vital signs in last 24 hours:  Blood pressure 135/62, pulse 90, temperature 98.3 F (36.8 C), temperature source Oral, resp. rate 18, height '5\' 3"'$  (1.6 m), weight 186 lb 6.4 oz (84.6 kg), SpO2 98 %.   Limited physical examination secondary to distancing with the COVID pandemic HEENT: Neck without mass Lymphatics: No cervical, supraclavicular, axillary, or inguinal node GI: No hepatosplenomegaly, healed surgical incision, nontender, no mass Vascular: No leg edema  Lab Results:  Lab Results  Component Value Date   WBC 4.0 08/06/2018   HGB 9.4 (L) 08/06/2018   HCT 28.3 (L) 08/06/2018   MCV 89.8 08/06/2018   PLT 101 (L) 08/06/2018   NEUTROABS 2.8 08/06/2018    CMP  Lab Results  Component Value Date   NA 137 07/11/2018   K 3.9 07/11/2018   CL 102 07/11/2018   CO2 21 (L) 07/11/2018   GLUCOSE 103 (H) 07/11/2018   BUN 15 07/11/2018   CREATININE 1.20 (H) 07/11/2018   CALCIUM 8.8 (L) 07/11/2018   PROT 7.1 07/01/2018   ALBUMIN 3.8 07/01/2018   AST 26 07/01/2018   ALT 14 07/01/2018   ALKPHOS 54 07/01/2018   BILITOT 0.7 07/01/2018   GFRNONAA 46 (L) 07/11/2018   GFRAA 53 (L) 07/11/2018     Medications: I have reviewed the patient's current medications.   Assessment/Plan: 1. Rectal cancer ? Mass at 7 cm from the anal verge on colonoscopy 08/09/2017, biopsy revealed invasive adenocarcinoma ? Staging CTs 08/17/2017-no evidence of metastatic disease, asymmetric thickening in the mid rectum ? MR pelvis 09/01/2017, T3N0 lesion beginning at 6.3 cm from the anal sphincter ? Radiation/Xeloda initiated 09/17/2017, completed 10/25/2017 ? 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  Lymphovascular  invasion present, intact mismatch repair protein expression, treatment effect present (TRS 1) ? Cycle 1 adjuvant Xeloda beginning 01/21/2018 ? Cycle 2 adjuvant Xeloda beginning 02/11/2018 ? Xeloda discontinued after cycle 2 secondary to patient preference ? Ileostomy takedown 07/10/2018  2.Hypothyroid  3.    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 Neuhaus is in remission from rectal cancer.  She will undergo restaging CT scans and return for an office visit in approximately 1 month.  She has mild anemia and thrombocytopenia today.  The anemia is likely secondary to recent surgery.  The thrombocytopenia is of unclear etiology.  We will repeat a CBC and check a blood smear when she returns next month.  She will contact us for bleeding or bruising.    Betsy Coder, MD  08/06/2018  10:00 AM

## 2018-08-26 ENCOUNTER — Telehealth: Payer: Self-pay | Admitting: *Deleted

## 2018-08-26 NOTE — Telephone Encounter (Addendum)
Called to report she told Dr. Benay Spice her last CT scan was at [facility] and this was incorrect. Last scan at [place] on [address]. Is it OK to do it at [place] or should it be moved to the [employer] [facility]? Per Dr. Benay Spice: Either place is OK. Left VM with [employer] location [phone]) to inquire if Dr. Benay Spice is allowed to order scan for that location? Informed by [facility] radiology that only [facility] physician can order scans there. Informed patient and she agrees to [place]. Can be any day as long as at 11:00 or later. She already has her contrast. Message sent to managed care to obtain PA so it can be scheduled.

## 2018-08-28 ENCOUNTER — Telehealth: Payer: Self-pay | Admitting: *Deleted

## 2018-08-28 NOTE — Telephone Encounter (Signed)
Notified of CT appointment on 8/7 and prep including contrast.

## 2018-09-06 ENCOUNTER — Ambulatory Visit
Admission: RE | Admit: 2018-09-06 | Discharge: 2018-09-06 | Disposition: A | Discharge status: Home / Self Care | Payer: Medicare Other | Source: Ambulatory Visit | Attending: Oncology | Admitting: Oncology

## 2018-09-06 ENCOUNTER — Other Ambulatory Visit: Payer: Self-pay

## 2018-09-06 DIAGNOSIS — C20 Malignant neoplasm of rectum: Secondary | ICD-10-CM

## 2018-09-06 MED ORDER — IOPAMIDOL (ISOVUE-300) INJECTION 61%
100.0000 mL | Freq: Once | INTRAVENOUS | Status: AC | PRN
  Administered 2018-09-06: 100 mL via INTRAVENOUS

## 2018-09-10 ENCOUNTER — Telehealth: Payer: Self-pay | Admitting: Oncology

## 2018-09-10 ENCOUNTER — Inpatient Hospital Stay: Payer: Medicare Other | Attending: Oncology

## 2018-09-10 ENCOUNTER — Inpatient Hospital Stay (HOSPITAL_BASED_OUTPATIENT_CLINIC_OR_DEPARTMENT_OTHER): Payer: Medicare Other | Admitting: Oncology

## 2018-09-10 ENCOUNTER — Other Ambulatory Visit: Payer: Self-pay

## 2018-09-10 VITALS — BP 140/78 | HR 80 | Temp 98.0°F | Resp 17 | Ht 63.0 in | Wt 185.6 lb

## 2018-09-10 DIAGNOSIS — Z923 Personal history of irradiation: Secondary | ICD-10-CM | POA: Insufficient documentation

## 2018-09-10 DIAGNOSIS — E039 Hypothyroidism, unspecified: Secondary | ICD-10-CM | POA: Insufficient documentation

## 2018-09-10 DIAGNOSIS — Z933 Colostomy status: Secondary | ICD-10-CM | POA: Insufficient documentation

## 2018-09-10 DIAGNOSIS — I700 Atherosclerosis of aorta: Secondary | ICD-10-CM | POA: Diagnosis not present

## 2018-09-10 DIAGNOSIS — Z79899 Other long term (current) drug therapy: Secondary | ICD-10-CM | POA: Diagnosis not present

## 2018-09-10 DIAGNOSIS — D6959 Other secondary thrombocytopenia: Secondary | ICD-10-CM | POA: Diagnosis not present

## 2018-09-10 DIAGNOSIS — R918 Other nonspecific abnormal finding of lung field: Secondary | ICD-10-CM | POA: Diagnosis not present

## 2018-09-10 DIAGNOSIS — C20 Malignant neoplasm of rectum: Secondary | ICD-10-CM

## 2018-09-10 DIAGNOSIS — Z9221 Personal history of antineoplastic chemotherapy: Secondary | ICD-10-CM | POA: Insufficient documentation

## 2018-09-10 DIAGNOSIS — T451X5A Adverse effect of antineoplastic and immunosuppressive drugs, initial encounter: Secondary | ICD-10-CM | POA: Insufficient documentation

## 2018-09-10 LAB — CBC WITH DIFFERENTIAL (CANCER CENTER ONLY)
Abs Immature Granulocytes: 0.02 10*3/uL (ref 0.00–0.07)
Basophils Absolute: 0 10*3/uL (ref 0.0–0.1)
Basophils Relative: 0 %
Eosinophils Absolute: 0.2 10*3/uL (ref 0.0–0.5)
Eosinophils Relative: 4 %
HCT: 31.9 % — ABNORMAL LOW (ref 36.0–46.0)
Hemoglobin: 10.1 g/dL — ABNORMAL LOW (ref 12.0–15.0)
Immature Granulocytes: 0 %
Lymphocytes Relative: 15 %
Lymphs Abs: 0.7 10*3/uL (ref 0.7–4.0)
MCH: 28.8 pg (ref 26.0–34.0)
MCHC: 31.7 g/dL (ref 30.0–36.0)
MCV: 90.9 fL (ref 80.0–100.0)
Monocytes Absolute: 0.5 10*3/uL (ref 0.1–1.0)
Monocytes Relative: 11 %
Neutro Abs: 3.1 10*3/uL (ref 1.7–7.7)
Neutrophils Relative %: 70 %
Platelet Count: 137 10*3/uL — ABNORMAL LOW (ref 150–400)
RBC: 3.51 MIL/uL — ABNORMAL LOW (ref 3.87–5.11)
RDW: 14.1 % (ref 11.5–15.5)
WBC Count: 4.5 10*3/uL (ref 4.0–10.5)
nRBC: 0 % (ref 0.0–0.2)

## 2018-09-10 NOTE — Progress Notes (Signed)
[place] OFFICE PROGRESS NOTE   Diagnosis: Rectal cancer  INTERVAL HISTORY:   Samantha Lutz returns as scheduled.  She feels well.  No new complaint.  Good appetite and energy level. No recent infection. Objective:  Vital signs in last 24 hours:  Blood pressure 140/78, pulse 80, temperature 98 F (36.7 C), temperature source Oral, resp. rate 17, height '5\' 3"'  (1.6 m), weight 185 lb 9.6 oz (84.2 kg), SpO2 100 %.    HEENT: Neck without mass Lymphatics: No cervical, supraclavicular, or axillary nodes GI: Soft, no hepatosplenomegaly, no mass Vascular: No leg edema   Lab Results:  Lab Results  Component Value Date   WBC 4.5 09/10/2018   HGB 10.1 (L) 09/10/2018   HCT 31.9 (L) 09/10/2018   MCV 90.9 09/10/2018   PLT 137 (L) 09/10/2018   NEUTROABS 3.1 09/10/2018    CMP  Lab Results  Component Value Date   NA 142 08/06/2018   K 3.7 08/06/2018   CL 108 08/06/2018   CO2 25 08/06/2018   GLUCOSE 89 08/06/2018   BUN 6 (L) 08/06/2018   CREATININE 0.81 08/06/2018   CALCIUM 8.0 (L) 08/06/2018   PROT 7.1 07/01/2018   ALBUMIN 3.8 07/01/2018   AST 26 07/01/2018   ALT 14 07/01/2018   ALKPHOS 54 07/01/2018   BILITOT 0.7 07/01/2018   GFRNONAA >60 08/06/2018   GFRAA >60 08/06/2018    Lab Results  Component Value Date   CEA1 <1.00 08/06/2018    No results found for: INR  Imaging:  Ct Chest W Contrast  Result Date: 09/07/2018 CLINICAL DATA:  68 year old female with history of rectal cancer status post colostomy and reversal as well as chemotherapy and radiation therapy. Loop ileostomy takedown on 07/10/2018. Follow-up evaluation for restaging examination. EXAM: CT CHEST, ABDOMEN, AND PELVIS WITH CONTRAST TECHNIQUE: Multidetector CT imaging of the chest, abdomen and pelvis was performed following the standard protocol during bolus administration of intravenous contrast. CONTRAST:  150m ISOVUE-300 IOPAMIDOL (ISOVUE-300) INJECTION 61% COMPARISON:  CT the chest,  abdomen and pelvis 08/17/2017. FINDINGS: CT CHEST FINDINGS Cardiovascular: Heart size is normal. There is no significant pericardial fluid, thickening or pericardial calcification. There is aortic atherosclerosis, as well as atherosclerosis of the great vessels of the mediastinum and the coronary arteries, including calcified atherosclerotic plaque in the left main and left circumflex coronary arteries. Mediastinum/Nodes: No pathologically enlarged mediastinal or hilar lymph nodes. Esophagus is unremarkable in appearance. No axillary lymphadenopathy. Lungs/Pleura: Multiple new pulmonary nodules are noted throughout the lungs bilaterally, largest of which measures 9 mm in the right lower lobe (axial image 87 of series 6), concerning for potential metastatic disease. No acute consolidative airspace disease. No pleural effusions. Musculoskeletal: There are no aggressive appearing lytic or blastic lesions noted in the visualized portions of the skeleton. CT ABDOMEN PELVIS FINDINGS Hepatobiliary: No suspicious cystic or solid hepatic lesions. No intra or extrahepatic biliary ductal dilatation. Gallbladder is normal in appearance. Pancreas: No pancreatic mass. No pancreatic ductal dilatation. No pancreatic or peripancreatic fluid collection or inflammatory changes. Spleen: Unremarkable. Adrenals/Urinary Tract: Bilateral kidneys and bilateral adrenal glands are normal in appearance. No hydroureteronephrosis. Urinary bladder is normal in appearance. Stomach/Bowel: Normal appearance of the stomach. No pathologic dilatation of small bowel or colon. Status post low anterior resection. Presacral low-intermediate attenuation collection measuring 2.9 x 6.9 x 8.6 cm (axial image 103 of series 2 and sagittal image 125 of series 4). The appendix is not confidently identified and may be surgically absent. Regardless, there are  no inflammatory changes noted adjacent to the cecum to suggest the presence of an acute appendicitis at this  time. Vascular/Lymphatic: Aortic atherosclerosis, without evidence of aneurysm or dissection in the abdominal or pelvic vasculature. No lymphadenopathy noted in the abdomen or pelvis. Reproductive: Uterus and ovaries are unremarkable in appearance. Other: Presacral fluid collection, as above. No significant volume of ascites. No pneumoperitoneum. Musculoskeletal: In the deep subcutaneous fat of the right anterior abdominal wall there is a 6.4 x 1.6 x 2.9 cm low-attenuation fluid collection with peripheral soft tissue thickening and enhancement and surrounding inflammatory changes (axial image 69 of series 2 and sagittal image 110 of series 4). There are no aggressive appearing lytic or blastic lesions noted in the visualized portions of the skeleton. IMPRESSION: 1. Postoperative changes of low anterior resection and ileostomy reversal with postoperative fluid collection in the presacral space, without definite enhancing soft tissue in this region to suggest locally recurrent disease. No definite signs of metastatic disease confidently identified in the abdomen or pelvis. However, there are multiple small pulmonary nodules throughout the lungs bilaterally which are new compared to the prior examination measuring up to 9 mm in the right lower lobe, concerning for potential metastatic disease. 2. Postoperative fluid collection in the deep subcutaneous fat of the right anterior abdominal wall, presumably at site of prior ileostomy take down. Given the rim enhancement and surrounding inflammatory changes, clinical correlation for signs and symptoms of abscess is recommended. 3. Aortic atherosclerosis, in addition to left main and left circumflex coronary artery disease. Please note that although the presence of coronary artery calcium documents the presence of coronary artery disease, the severity of this disease and any potential stenosis cannot be assessed on this non-gated CT examination. Assessment for potential risk  factor modification, dietary therapy or pharmacologic therapy may be warranted, if clinically indicated. 4. Additional incidental findings, as above. Electronically Signed   By: Vinnie Langton M.D.   On: 09/07/2018 14:25   Ct Abdomen Pelvis W Contrast  Result Date: 09/07/2018 CLINICAL DATA:  68 year old female with history of rectal cancer status post colostomy and reversal as well as chemotherapy and radiation therapy. Loop ileostomy takedown on 07/10/2018. Follow-up evaluation for restaging examination. EXAM: CT CHEST, ABDOMEN, AND PELVIS WITH CONTRAST TECHNIQUE: Multidetector CT imaging of the chest, abdomen and pelvis was performed following the standard protocol during bolus administration of intravenous contrast. CONTRAST:  154m ISOVUE-300 IOPAMIDOL (ISOVUE-300) INJECTION 61% COMPARISON:  CT the chest, abdomen and pelvis 08/17/2017. FINDINGS: CT CHEST FINDINGS Cardiovascular: Heart size is normal. There is no significant pericardial fluid, thickening or pericardial calcification. There is aortic atherosclerosis, as well as atherosclerosis of the great vessels of the mediastinum and the coronary arteries, including calcified atherosclerotic plaque in the left main and left circumflex coronary arteries. Mediastinum/Nodes: No pathologically enlarged mediastinal or hilar lymph nodes. Esophagus is unremarkable in appearance. No axillary lymphadenopathy. Lungs/Pleura: Multiple new pulmonary nodules are noted throughout the lungs bilaterally, largest of which measures 9 mm in the right lower lobe (axial image 87 of series 6), concerning for potential metastatic disease. No acute consolidative airspace disease. No pleural effusions. Musculoskeletal: There are no aggressive appearing lytic or blastic lesions noted in the visualized portions of the skeleton. CT ABDOMEN PELVIS FINDINGS Hepatobiliary: No suspicious cystic or solid hepatic lesions. No intra or extrahepatic biliary ductal dilatation. Gallbladder is  normal in appearance. Pancreas: No pancreatic mass. No pancreatic ductal dilatation. No pancreatic or peripancreatic fluid collection or inflammatory changes. Spleen: Unremarkable. Adrenals/Urinary Tract: Bilateral kidneys  and bilateral adrenal glands are normal in appearance. No hydroureteronephrosis. Urinary bladder is normal in appearance. Stomach/Bowel: Normal appearance of the stomach. No pathologic dilatation of small bowel or colon. Status post low anterior resection. Presacral low-intermediate attenuation collection measuring 2.9 x 6.9 x 8.6 cm (axial image 103 of series 2 and sagittal image 125 of series 4). The appendix is not confidently identified and may be surgically absent. Regardless, there are no inflammatory changes noted adjacent to the cecum to suggest the presence of an acute appendicitis at this time. Vascular/Lymphatic: Aortic atherosclerosis, without evidence of aneurysm or dissection in the abdominal or pelvic vasculature. No lymphadenopathy noted in the abdomen or pelvis. Reproductive: Uterus and ovaries are unremarkable in appearance. Other: Presacral fluid collection, as above. No significant volume of ascites. No pneumoperitoneum. Musculoskeletal: In the deep subcutaneous fat of the right anterior abdominal wall there is a 6.4 x 1.6 x 2.9 cm low-attenuation fluid collection with peripheral soft tissue thickening and enhancement and surrounding inflammatory changes (axial image 69 of series 2 and sagittal image 110 of series 4). There are no aggressive appearing lytic or blastic lesions noted in the visualized portions of the skeleton. IMPRESSION: 1. Postoperative changes of low anterior resection and ileostomy reversal with postoperative fluid collection in the presacral space, without definite enhancing soft tissue in this region to suggest locally recurrent disease. No definite signs of metastatic disease confidently identified in the abdomen or pelvis. However, there are multiple  small pulmonary nodules throughout the lungs bilaterally which are new compared to the prior examination measuring up to 9 mm in the right lower lobe, concerning for potential metastatic disease. 2. Postoperative fluid collection in the deep subcutaneous fat of the right anterior abdominal wall, presumably at site of prior ileostomy take down. Given the rim enhancement and surrounding inflammatory changes, clinical correlation for signs and symptoms of abscess is recommended. 3. Aortic atherosclerosis, in addition to left main and left circumflex coronary artery disease. Please note that although the presence of coronary artery calcium documents the presence of coronary artery disease, the severity of this disease and any potential stenosis cannot be assessed on this non-gated CT examination. Assessment for potential risk factor modification, dietary therapy or pharmacologic therapy may be warranted, if clinically indicated. 4. Additional incidental findings, as above. Electronically Signed   By: Vinnie Langton M.D.   On: 09/07/2018 14:25    Medications: I have reviewed the patient's current medications.   Assessment/Plan: 1. Rectal cancer ? Mass at 7 cm from the anal verge on colonoscopy 08/09/2017, biopsy revealed invasive adenocarcinoma ? Staging CTs 08/17/2017-no evidence of metastatic disease, asymmetric thickening in the mid rectum ? MR pelvis 09/01/2017, T3N0 lesion beginning at 6.3 cm from the anal sphincter ? Radiation/Xeloda initiated 09/17/2017, completed 10/25/2017 ? 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  Lymphovascular invasion present, intact mismatch repair protein expression, treatment effect present (TRS 1) ? Cycle 1 adjuvant Xeloda beginning 01/21/2018 ? Cycle 2 adjuvant Xeloda beginning 02/11/2018 ? Xeloda discontinued after cycle 2 secondary to patient preference ? Ileostomy takedown 07/10/2018 ? CTs 09/07/2018- multiple live small pulmonary nodules  concerning for metastatic disease  2.Hypothyroid  3.    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 Francom appears stable.  The restaging CT reveals new lung nodules.  I reviewed the CT images with Samantha Lutz.  There are several lung nodules adjacent to blood vessels that may represent metastases.  I will present her case at the GI tumor  conference within the next few weeks.  We will plan for a repeat chest CT and office visit in 3 months.  25 minutes were spent with the patient today.  The majority of the time was used for counseling and coordination of care.  Betsy Coder, MD  09/10/2018  10:28 AM

## 2018-09-10 NOTE — Telephone Encounter (Signed)
Scheduled appt per 8/11 los - gave patient AVS and number for central radiology to set up scan

## 2018-10-28 ENCOUNTER — Other Ambulatory Visit: Payer: Self-pay | Admitting: Obstetrics and Gynecology

## 2018-10-28 DIAGNOSIS — Z1231 Encounter for screening mammogram for malignant neoplasm of breast: Secondary | ICD-10-CM

## 2018-11-01 ENCOUNTER — Encounter: Payer: Self-pay | Admitting: Family

## 2018-11-01 ENCOUNTER — Ambulatory Visit (INDEPENDENT_AMBULATORY_CARE_PROVIDER_SITE_OTHER)
Admission: RE | Admit: 2018-11-01 | Discharge: 2018-11-01 | Disposition: A | Discharge status: Home / Self Care | Payer: Medicare Other | Source: Ambulatory Visit | Attending: Family | Admitting: Family

## 2018-11-01 ENCOUNTER — Other Ambulatory Visit (INDEPENDENT_AMBULATORY_CARE_PROVIDER_SITE_OTHER): Payer: Medicare Other

## 2018-11-01 ENCOUNTER — Ambulatory Visit (INDEPENDENT_AMBULATORY_CARE_PROVIDER_SITE_OTHER): Payer: Medicare Other | Admitting: Family

## 2018-11-01 ENCOUNTER — Other Ambulatory Visit: Payer: Self-pay

## 2018-11-01 ENCOUNTER — Other Ambulatory Visit: Payer: Self-pay | Admitting: Family

## 2018-11-01 VITALS — BP 160/90 | HR 82 | Temp 98.1°F | Ht 63.0 in | Wt 192.0 lb

## 2018-11-01 DIAGNOSIS — E2839 Other primary ovarian failure: Secondary | ICD-10-CM

## 2018-11-01 DIAGNOSIS — E039 Hypothyroidism, unspecified: Secondary | ICD-10-CM

## 2018-11-01 DIAGNOSIS — Z13220 Encounter for screening for lipoid disorders: Secondary | ICD-10-CM

## 2018-11-01 DIAGNOSIS — E559 Vitamin D deficiency, unspecified: Secondary | ICD-10-CM | POA: Diagnosis not present

## 2018-11-01 DIAGNOSIS — R030 Elevated blood-pressure reading, without diagnosis of hypertension: Secondary | ICD-10-CM

## 2018-11-01 LAB — VITAMIN D 25 HYDROXY (VIT D DEFICIENCY, FRACTURES): VITD: 23.83 ng/mL — ABNORMAL LOW (ref 30.00–100.00)

## 2018-11-01 LAB — COMPREHENSIVE METABOLIC PANEL
ALT: 8 U/L (ref 0–35)
AST: 15 U/L (ref 0–37)
Albumin: 3.9 g/dL (ref 3.5–5.2)
Alkaline Phosphatase: 52 U/L (ref 39–117)
BUN: 24 mg/dL — ABNORMAL HIGH (ref 6–23)
CO2: 27 mEq/L (ref 19–32)
Calcium: 9.2 mg/dL (ref 8.4–10.5)
Chloride: 106 mEq/L (ref 96–112)
Creatinine, Ser: 0.94 mg/dL (ref 0.40–1.20)
GFR: 58.9 mL/min — ABNORMAL LOW (ref >60.00)
Glucose, Bld: 81 mg/dL (ref 70–99)
Potassium: 4.1 mEq/L (ref 3.5–5.1)
Sodium: 142 mEq/L (ref 135–145)
Total Bilirubin: 0.4 mg/dL (ref 0.2–1.2)
Total Protein: 6.9 g/dL (ref 6.0–8.3)

## 2018-11-01 LAB — LIPID PANEL
Cholesterol: 167 mg/dL (ref 0–200)
HDL: 56.7 mg/dL (ref >39.00)
LDL Cholesterol: 92 mg/dL (ref 0–99)
NonHDL: 110.67
Total CHOL/HDL Ratio: 3
Triglycerides: 92 mg/dL (ref 0.0–149.0)
VLDL: 18.4 mg/dL (ref 0.0–40.0)

## 2018-11-01 LAB — CBC WITH DIFFERENTIAL/PLATELET
Basophils Absolute: 0 10*3/uL (ref 0.0–0.1)
Basophils Relative: 0.3 % (ref 0.0–3.0)
Eosinophils Absolute: 0.1 10*3/uL (ref 0.0–0.7)
Eosinophils Relative: 2.4 % (ref 0.0–5.0)
HCT: 28.9 % — ABNORMAL LOW (ref 36.0–46.0)
Hemoglobin: 9.7 g/dL — ABNORMAL LOW (ref 12.0–15.0)
Lymphocytes Relative: 21.2 % (ref 12.0–46.0)
Lymphs Abs: 1 10*3/uL (ref 0.7–4.0)
MCHC: 33.4 g/dL (ref 30.0–36.0)
MCV: 83.4 fl (ref 78.0–100.0)
Monocytes Absolute: 0.7 10*3/uL (ref 0.1–1.0)
Monocytes Relative: 13.5 % — ABNORMAL HIGH (ref 3.0–12.0)
Neutro Abs: 3.1 10*3/uL (ref 1.4–7.7)
Neutrophils Relative %: 62.6 % (ref 43.0–77.0)
Platelets: 162 10*3/uL (ref 150.0–400.0)
RBC: 3.47 Mil/uL — ABNORMAL LOW (ref 3.87–5.11)
RDW: 14.6 % (ref 11.5–15.5)
WBC: 4.9 10*3/uL (ref 4.0–10.5)

## 2018-11-01 LAB — TSH: TSH: 6.58 u[IU]/mL — ABNORMAL HIGH (ref 0.35–4.50)

## 2018-11-01 MED ORDER — VITAMIN D (ERGOCALCIFEROL) 1.25 MG (50000 UNIT) PO CAPS: 50000.0000 [IU] | ORAL_CAPSULE | ORAL | 0 refills | Status: AC

## 2018-11-01 NOTE — Patient Instructions (Signed)
Please start checking your blood pressure regularly; if it is consistently above 140/90, please let our office know;

## 2018-11-01 NOTE — Progress Notes (Signed)
Samantha Lutz is a 68 y.o. female with the following history as recorded in [employer]:  Patient Active Problem List   Diagnosis Date Noted  . Status post ileostomy takedown 07/10/2018 07/10/2018  . Rectal cancer ypT3ypN1a (1/23 LN) s/p neoadj chemoXRT, robotic LAR resection & diverting loop ileostomy 12/21/2017 09/04/2017  . Hypothyroid 01/16/2013  . Right knee pain   . Elevated LFTs 01/03/2011  . Hypertension 12/19/2010  . Morbid obesity with body mass index of 40.0-49.9 (HCC)     Current Outpatient Medications  Medication Sig Dispense Refill  . levothyroxine (SYNTHROID, LEVOTHROID) 50 MCG tablet Take 1 tablet (50 mcg total) by mouth daily. Needs visit for future refills. 90 tablet 0   No current facility-administered medications for this visit.     Allergies: Epinephrine and Losartan  Past Medical History:  Diagnosis Date  . Anemia    remote hx.  . Arthritis    Bi lat knee  . Fatty liver    ultrasound December 2012  . Hypothyroid 01/16/2013 dx  . Irregular heartbeat    PER PATIENT REPORT; ONSET 20+YEARS AGO SAW CARDIOLOGY AT THE TIME C/O "ITS BEAT REALLY FAST FOR A MINUTE AND THEN STOPPED " DENIES ANY OTHER CARDIAC SX';  REPORTS CARDIO DID ECHO WHICH WAS NEGATIVE;  NOW COMES AND GOES ;   . Lichen sclerosus    steroids as needed  . Temporary low platelet count (HCC)    SEE LAST LABS IN EPIC    Past Surgical History:  Procedure Laterality Date  . APPENDECTOMY  1967  . [place]  . ILEOSTOMY CLOSURE N/A 07/10/2018   Procedure: ILEOSTOMY LOOP TAKEDOWN WITH TAP BLOCK;  Surgeon: [name], MD;  Location: [facility];  Service: General;  Laterality: N/A;  . OSTOMY N/A 12/21/2017   Procedure: DIVERTING LOOP OSTOMY;  Surgeon: [name], MD;  Location: [facility];  Service: General;  Laterality: N/A;  . PROCTOSCOPY N/A 12/21/2017   Procedure: RIGID PROCTOSCOPY;  Surgeon: [name], MD;  Location: [facility];  Service: General;  Laterality: N/A;  . XI ROBOTIC  ASSISTED LOWER ANTERIOR RESECTION N/A 12/21/2017   Procedure: XI ROBOTIC ASSISTED LOWER ANTERIOR RESECTION;  Surgeon: [name], MD;  Location: [facility];  Service: General;  Laterality: N/A;    Family History  Problem Relation Age of Onset  . Diabetes Mother   . Hypertension Mother   . COPD Mother   . Glaucoma Father 73  . Stroke Maternal Grandmother 13  . Hypertension Maternal Grandmother   . Rheum arthritis Sister 29  . Hypertension Daughter   . Breast cancer Cousin   . Colon cancer Neg Hx   . Esophageal cancer Neg Hx   . Liver cancer Neg Hx   . Pancreatic cancer Neg Hx   . Stomach cancer Neg Hx   . Rectal cancer Neg Hx     Social History   Tobacco Use  . Smoking status: Never Smoker  . Smokeless tobacco: Never Used  Substance Use Topics  . Alcohol use: No    Subjective:  Presents for transfer of care- previous PCP has left our office.  Currently takes 25 mcg of Levothyroxine for her hypothyroidism- cannot tolerate 50 mcg- causes palpitations. Notes she has never taken full dosage since prescription was started- has been cutting tablet in 1/2 x 3-4 years; Scheduled for mammogram later this year; agreeable to updating DEXA Notes that blood pressure is always elevated at office visits- checks at home and is normal;  Denies any chest pain, shortness of breath, blurred vision or headache Continuing with oncology for regular follow-up due to history of rectal cancer Defers updating vaccines today;   Health Maintenance  Topic Date Due  . INFLUENZA VACCINE  05/01/2019 (Originally 08/31/2018)  . PNA vac Low Risk Adult (1 of 2 - PCV13) 11/01/2019 (Originally 12/28/2013)  . MAMMOGRAM  02/03/2019  . Fecal DNA (Cologuard)  05/22/2020  . TETANUS/TDAP  01/03/2025  . DEXA SCAN  Completed  . Hepatitis C Screening  Completed     Objective:  Vitals:   11/01/18 1324  BP: (!) 160/90  Pulse: 82  Temp: 98.1 F (36.7 C)  TempSrc: Oral  SpO2: 99%  Weight: 192 lb (87.1 kg)  Height:  _0  (1.6 m)    General: Well developed, well nourished, in no acute distress  Skin : Warm and dry.  Head: Normocephalic and atraumatic  Eyes: Sclera and conjunctiva clear; pupils round and reactive to light; extraocular movements intact  Ears: External normal; canals clear; tympanic membranes normal  Oropharynx: Pink, supple. No suspicious lesions  Neck: Supple without thyromegaly, adenopathy  Lungs: Respirations unlabored; clear to auscultation bilaterally without wheeze, rales, rhonchi  CVS exam: normal rate and regular rhythm.  Neurologic: Alert and oriented; speech intact; face symmetrical; moves all extremities well; CNII-XII intact without focal deficit   Assessment:  1. Hypothyroidism, unspecified type   2. Ovarian failure   3. Vitamin D deficiency   4. Lipid screening   5. Elevated blood pressure reading     Plan:  1. Check TSH today; assuming levels are stable, will change Rx to 25 mcg daily; follow-up to be determined. 2. Update DEXA today; 3. Check Vitamin D level today; 4. Check lipid panel today; 5. ? White coat hypertension; patient is encouraged and agrees to start checking her blood pressure regularly- she will follow-up if consistently above 140/ 90.  No follow-ups on file.  Orders Placed This Encounter  Procedures  . DG Bone Density    Standing Status:   Future    Number of Occurrences:   1    Standing Expiration Date:   01/01/2020    Order Specific Question:   Reason for Exam (SYMPTOM  OR DIAGNOSIS REQUIRED)    Answer:   ovarian failure    Order Specific Question:   Preferred imaging location?    Answer:   [facility]  . CBC w/Diff    Standing Status:   Future    Number of Occurrences:   1    Standing Expiration Date:   11/01/2019  . Comp Met (CMET)    Standing Status:   Future    Number of Occurrences:   1    Standing Expiration Date:   11/01/2019  . TSH    Standing Status:   Future    Number of Occurrences:   1    Standing Expiration Date:    11/01/2019  . Lipid panel    Standing Status:   Future    Number of Occurrences:   1    Standing Expiration Date:   11/01/2019  . Vitamin D (25 hydroxy)    Standing Status:   Future    Number of Occurrences:   1    Standing Expiration Date:   11/01/2019    Requested Prescriptions    No prescriptions requested or ordered in this encounter

## 2018-11-07 ENCOUNTER — Other Ambulatory Visit: Payer: Self-pay

## 2018-11-08 MED ORDER — LEVOTHYROXINE SODIUM 50 MCG PO TABS: 50.0000 ug | ORAL_TABLET | Freq: Every day | ORAL | 0 refills | Status: DC

## 2018-11-13 ENCOUNTER — Encounter: Payer: Self-pay | Admitting: Gastroenterology

## 2018-11-20 ENCOUNTER — Other Ambulatory Visit: Payer: Self-pay | Admitting: *Deleted

## 2018-11-20 DIAGNOSIS — C20 Malignant neoplasm of rectum: Secondary | ICD-10-CM

## 2018-11-20 DIAGNOSIS — D649 Anemia, unspecified: Secondary | ICD-10-CM

## 2018-11-20 NOTE — Progress Notes (Signed)
Message from Dr. Benay Spice to order CBC/diff, smear, ferritin and sample to blood bank for 11/10 visit. Scheduling message sent and lab orders placed.

## 2018-11-21 ENCOUNTER — Telehealth: Payer: Self-pay | Admitting: Oncology

## 2018-11-21 NOTE — Telephone Encounter (Signed)
Scheduled appt per 10/21 sch message - unable to reach pt - left message with appt date and time

## 2018-11-29 ENCOUNTER — Encounter: Payer: Self-pay | Admitting: Gastroenterology

## 2018-12-10 ENCOUNTER — Telehealth: Payer: Self-pay | Admitting: *Deleted

## 2018-12-10 ENCOUNTER — Ambulatory Visit
Admission: RE | Admit: 2018-12-10 | Discharge: 2018-12-10 | Disposition: A | Discharge status: Home / Self Care | Payer: Medicare Other | Source: Ambulatory Visit | Attending: Oncology | Admitting: Oncology

## 2018-12-10 ENCOUNTER — Inpatient Hospital Stay: Payer: Medicare Other | Attending: Oncology | Admitting: Oncology

## 2018-12-10 ENCOUNTER — Telehealth: Payer: Self-pay | Admitting: Oncology

## 2018-12-10 ENCOUNTER — Inpatient Hospital Stay: Payer: Medicare Other

## 2018-12-10 ENCOUNTER — Other Ambulatory Visit: Payer: Self-pay

## 2018-12-10 VITALS — BP 178/84 | HR 78 | Temp 98.7°F | Resp 17 | Ht 63.0 in | Wt 192.7 lb

## 2018-12-10 DIAGNOSIS — C20 Malignant neoplasm of rectum: Secondary | ICD-10-CM

## 2018-12-10 DIAGNOSIS — E039 Hypothyroidism, unspecified: Secondary | ICD-10-CM | POA: Insufficient documentation

## 2018-12-10 DIAGNOSIS — I700 Atherosclerosis of aorta: Secondary | ICD-10-CM | POA: Insufficient documentation

## 2018-12-10 DIAGNOSIS — D6959 Other secondary thrombocytopenia: Secondary | ICD-10-CM | POA: Insufficient documentation

## 2018-12-10 DIAGNOSIS — Z923 Personal history of irradiation: Secondary | ICD-10-CM | POA: Insufficient documentation

## 2018-12-10 DIAGNOSIS — Z9221 Personal history of antineoplastic chemotherapy: Secondary | ICD-10-CM | POA: Diagnosis not present

## 2018-12-10 DIAGNOSIS — Z79899 Other long term (current) drug therapy: Secondary | ICD-10-CM | POA: Insufficient documentation

## 2018-12-10 DIAGNOSIS — D649 Anemia, unspecified: Secondary | ICD-10-CM

## 2018-12-10 DIAGNOSIS — T451X5A Adverse effect of antineoplastic and immunosuppressive drugs, initial encounter: Secondary | ICD-10-CM | POA: Diagnosis not present

## 2018-12-10 LAB — CBC WITH DIFFERENTIAL (CANCER CENTER ONLY)
Abs Immature Granulocytes: 0.02 10*3/uL (ref 0.00–0.07)
Basophils Absolute: 0 10*3/uL (ref 0.0–0.1)
Basophils Relative: 0 %
Eosinophils Absolute: 0.1 10*3/uL (ref 0.0–0.5)
Eosinophils Relative: 1 %
HCT: 36.3 % (ref 36.0–46.0)
Hemoglobin: 11.6 g/dL — ABNORMAL LOW (ref 12.0–15.0)
Immature Granulocytes: 0 %
Lymphocytes Relative: 13 %
Lymphs Abs: 0.7 10*3/uL (ref 0.7–4.0)
MCH: 28.2 pg (ref 26.0–34.0)
MCHC: 32 g/dL (ref 30.0–36.0)
MCV: 88.1 fL (ref 80.0–100.0)
Monocytes Absolute: 0.5 10*3/uL (ref 0.1–1.0)
Monocytes Relative: 10 %
Neutro Abs: 4 10*3/uL (ref 1.7–7.7)
Neutrophils Relative %: 76 %
Platelet Count: 130 10*3/uL — ABNORMAL LOW (ref 150–400)
RBC: 4.12 MIL/uL (ref 3.87–5.11)
RDW: 17.1 % — ABNORMAL HIGH (ref 11.5–15.5)
WBC Count: 5.2 10*3/uL (ref 4.0–10.5)
nRBC: 0 % (ref 0.0–0.2)

## 2018-12-10 LAB — SAMPLE TO BLOOD BANK

## 2018-12-10 LAB — FERRITIN: Ferritin: 13 ng/mL (ref 11–307)

## 2018-12-10 LAB — SAVE SMEAR(SSMR), FOR PROVIDER SLIDE REVIEW

## 2018-12-10 NOTE — Telephone Encounter (Signed)
Per Dr. Benay Spice, called and made pt aware that her hemoglobin is better, and that her iron is at the low end of normal. Pt stated that she is taking iron daily. Also, a CBC and ferritin will be repeated at next visit in 4 months. Pt verbalized understanding.

## 2018-12-10 NOTE — Telephone Encounter (Signed)
Gave avs and calendar ° °

## 2018-12-10 NOTE — Telephone Encounter (Signed)
-----   Message from Ladell Pier, MD sent at 12/10/2018  2:14 PM EST ----- Please call patient hemoglobin is better, iron is at the low end of normal range, is she taking iron? Repeat CBC and ferritin when she returns in 4 months

## 2018-12-10 NOTE — Progress Notes (Signed)
[place] OFFICE PROGRESS NOTE   Diagnosis: Rectal cancer  INTERVAL HISTORY:   Ms. Wasser returns as scheduled.  She feels well.  Good appetite.  No dyspnea or cough.  No complaint.  She continues to have irregular bowel habits.  Objective:  Vital signs in last 24 hours:  Blood pressure (!) 178/84, pulse 78, temperature 98.7 F (37.1 C), temperature source Temporal, resp. rate 17, height '5\' 3"'  (1.6 m), weight 192 lb 11.2 oz (87.4 kg), SpO2 100 %.    Limited physical examination secondary to distancing with the Covid pandemic Lymphatics: No cervical, supraclavicular, axillary, or inguinal nodes GI: No hepatosplenomegaly, no mass, nontender Vascular: No leg edema   Lab Results:  Lab Results  Component Value Date   WBC 5.2 12/10/2018   HGB 11.6 (L) 12/10/2018   HCT 36.3 12/10/2018   MCV 88.1 12/10/2018   PLT 130 (L) 12/10/2018   NEUTROABS 4.0 12/10/2018    CMP  Lab Results  Component Value Date   NA 142 11/01/2018   K 4.1 11/01/2018   CL 106 11/01/2018   CO2 27 11/01/2018   GLUCOSE 81 11/01/2018   BUN 24 (H) 11/01/2018   CREATININE 0.94 11/01/2018   CALCIUM 9.2 11/01/2018   PROT 6.9 11/01/2018   ALBUMIN 3.9 11/01/2018   AST 15 11/01/2018   ALT 8 11/01/2018   ALKPHOS 52 11/01/2018   BILITOT 0.4 11/01/2018   GFRNONAA >60 08/06/2018   GFRAA >60 08/06/2018    Lab Results  Component Value Date   CEA1 <1.00 08/06/2018    Imaging:  Ct Chest Wo Contrast  Result Date: 12/10/2018 CLINICAL DATA:  Follow-up lung nodules. History of rectal cancer. EXAM: CT CHEST WITHOUT CONTRAST TECHNIQUE: Multidetector CT imaging of the chest was performed following the standard protocol without IV contrast. COMPARISON:  09/06/2018 FINDINGS: Cardiovascular: Normal heart size. No pericardial effusion identified. Aortic atherosclerosis. Lad coronary artery atherosclerotic calcification. Mediastinum/Nodes: No enlarged mediastinal or axillary lymph nodes. Thyroid  gland, trachea, and esophagus demonstrate no significant findings. Lungs/Pleura: No pleural effusion identified. No airspace consolidation, atelectasis or pneumothorax. Pulmonary nodule within the superior segment of right lower lobe measures 5 mm, image 67/8. On the previous exam this measured 3 mm. Right middle lobe lung nodule measures 4 mm, image 79/3. Previously 2 mm. Right lower lobe lung nodule measures 5 mm, image 83/3. Previously 4 mm. Right lower lobe lung nodule measures 1.1 cm, image 97/3. Previously 0.9 cm. Also in the right lower lobe is a 8 mm lung nodule, image 101/3. Previously 6 mm. Left upper lobe lung nodule measures 3 mm, image 65/3. Unchanged. Upper Abdomen: No acute abnormality. Stones identified within the gallbladder. Normal appearance of the adrenal glands. Musculoskeletal: Spondylosis identified within the thoracic spine. No aggressive lytic or sclerotic bone lesions. IMPRESSION: 1. Multiple bilateral pulmonary nodules are again noted. Several of these have increased in size in the interval as described above. These remain worrisome for metastatic disease. 2. Coronary artery calcifications noted. 3. Gallstones. Aortic Atherosclerosis (ICD10-I70.0). Electronically Signed   By: Kerby Moors M.D.   On: 12/10/2018 11:14    Medications: I have reviewed the patient's current medications.   Assessment/Plan: 1. Rectal cancer ? Mass at 7 cm from the anal verge on colonoscopy 08/09/2017, biopsy revealed invasive adenocarcinoma ? Staging CTs 08/17/2017-no evidence of metastatic disease, asymmetric thickening in the mid rectum ? MR pelvis 09/01/2017, T3N0 lesion beginning at 6.3 cm from the anal sphincter ? Radiation/Xeloda initiated 09/17/2017, completed 10/25/2017 ? Low  anterior resection/diverting ileostomy 12/21/2017,ypT3,ypN1a tumor.  Lymphovascular invasion present, intact mismatch repair protein expression, treatment effect present (TRS 1) ? Cycle 1 adjuvant Xeloda beginning 01/21/2018  ? Cycle 2 adjuvant Xeloda beginning 02/11/2018 ? Xeloda discontinued after cycle 2 secondary to patient preference ? Ileostomy takedown 07/10/2018 ? CTs 09/07/2018- multiple live small pulmonary nodules concerning for metastatic disease ? CT chest 12/10/2018-multiple bilateral lung nodules, some have increased in size  2.Hypothyroid  3.    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 Buesing appears unchanged.  I reviewed the CT images and discussed treatment options with her.  She understands the high likelihood that the lung nodules represent metastatic rectal cancer.  We discussed treatment options including observation, referral for surgery stereotactic radiation, and systemic chemotherapy.  I think it is unlikely that any treatment will be curative.  Chemotherapy has a significant chance of yielding a partial remission, but this will not be curative.  She is comfortable with observation and a repeat CT in 4 months.  I will present her case at the GI tumor conference next week.  She will return for an office visit and CT chest in 4 months.  She will call in the interim for new symptoms.  Betsy Coder, MD  12/10/2018  1:42 PM

## 2018-12-18 ENCOUNTER — Ambulatory Visit
Admission: RE | Admit: 2018-12-18 | Discharge: 2018-12-18 | Disposition: A | Discharge status: Home / Self Care | Payer: Medicare Other | Source: Ambulatory Visit | Attending: Obstetrics and Gynecology | Admitting: Obstetrics and Gynecology

## 2018-12-18 ENCOUNTER — Other Ambulatory Visit: Payer: Self-pay

## 2018-12-18 DIAGNOSIS — Z1231 Encounter for screening mammogram for malignant neoplasm of breast: Secondary | ICD-10-CM

## 2018-12-23 ENCOUNTER — Other Ambulatory Visit: Payer: Self-pay

## 2018-12-23 ENCOUNTER — Encounter: Payer: Self-pay | Admitting: Gastroenterology

## 2018-12-23 ENCOUNTER — Ambulatory Visit (AMBULATORY_SURGERY_CENTER): Payer: Medicare Other | Admitting: *Deleted

## 2018-12-23 VITALS — Ht 63.0 in | Wt 190.8 lb

## 2018-12-23 DIAGNOSIS — Z86010 Personal history of colonic polyps: Secondary | ICD-10-CM

## 2018-12-23 DIAGNOSIS — Z1159 Encounter for screening for other viral diseases: Secondary | ICD-10-CM

## 2018-12-23 DIAGNOSIS — C20 Malignant neoplasm of rectum: Secondary | ICD-10-CM

## 2018-12-23 MED ORDER — SUPREP BOWEL PREP KIT 17.5-3.13-1.6 GM/177ML PO SOLN: 1.0000 | Freq: Once | ORAL | 0 refills | Status: AC

## 2018-12-23 NOTE — Progress Notes (Signed)
Patient denies any allergies to egg or soy products. Patient denies complications with anesthesia/sedation.  Patient denies oxygen use at home and denies diet medications. Emmi instructions for colonoscopy/endoscopy explained and given to patient.   

## 2019-01-01 ENCOUNTER — Ambulatory Visit (INDEPENDENT_AMBULATORY_CARE_PROVIDER_SITE_OTHER): Payer: Medicare Other

## 2019-01-01 ENCOUNTER — Other Ambulatory Visit: Payer: Self-pay | Admitting: Gastroenterology

## 2019-01-01 DIAGNOSIS — Z1159 Encounter for screening for other viral diseases: Secondary | ICD-10-CM

## 2019-01-02 LAB — SARS CORONAVIRUS 2 (TAT 6-24 HRS): SARS Coronavirus 2: NEGATIVE

## 2019-01-06 ENCOUNTER — Other Ambulatory Visit: Payer: Self-pay

## 2019-01-06 ENCOUNTER — Encounter: Payer: Self-pay | Admitting: Gastroenterology

## 2019-01-06 ENCOUNTER — Ambulatory Visit (AMBULATORY_SURGERY_CENTER): Payer: Medicare Other | Admitting: Gastroenterology

## 2019-01-06 VITALS — BP 145/68 | HR 67 | Temp 98.1°F | Resp 11 | Ht 63.0 in | Wt 190.8 lb

## 2019-01-06 DIAGNOSIS — Z85048 Personal history of other malignant neoplasm of rectum, rectosigmoid junction, and anus: Secondary | ICD-10-CM

## 2019-01-06 MED ORDER — SODIUM CHLORIDE 0.9 % IV SOLN: 500.0000 mL | Freq: Once | INTRAVENOUS | Status: DC

## 2019-01-06 NOTE — Patient Instructions (Signed)
Please read handouts provided. Continue present medications.     YOU HAD AN ENDOSCOPIC PROCEDURE TODAY AT THE [facility]:   Refer to the procedure report that was given to you for any specific questions about what was found during the examination.  If the procedure report does not answer your questions, please call your gastroenterologist to clarify.  If you requested that your care partner not be given the details of your procedure findings, then the procedure report has been included in a sealed envelope for you to review at your convenience later.  YOU SHOULD EXPECT: Some feelings of bloating in the abdomen. Passage of more gas than usual.  Walking can help get rid of the air that was put into your GI tract during the procedure and reduce the bloating. If you had a lower endoscopy (such as a colonoscopy or flexible sigmoidoscopy) you may notice spotting of blood in your stool or on the toilet paper. If you underwent a bowel prep for your procedure, you may not have a normal bowel movement for a few days.  Please Note:  You might notice some irritation and congestion in your nose or some drainage.  This is from the oxygen used during your procedure.  There is no need for concern and it should clear up in a day or so.  SYMPTOMS TO REPORT IMMEDIATELY:   Following lower endoscopy (colonoscopy or flexible sigmoidoscopy):  Excessive amounts of blood in the stool  Significant tenderness or worsening of abdominal pains  Swelling of the abdomen that is new, acute  Fever of 100F or higher   For urgent or emergent issues, a gastroenterologist can be reached at any hour by calling [phone].   DIET:  We do recommend a small meal at first, but then you may proceed to your regular diet.  Drink plenty of fluids but you should avoid alcoholic beverages for 24 hours.  ACTIVITY:  You should plan to take it easy for the rest of today and you should NOT DRIVE or use heavy machinery  until tomorrow (because of the sedation medicines used during the test).    FOLLOW UP: Our staff will call the number listed on your records 48-72 hours following your procedure to check on you and address any questions or concerns that you may have regarding the information given to you following your procedure. If we do not reach you, we will leave a message.  We will attempt to reach you two times.  During this call, we will ask if you have developed any symptoms of COVID 19. If you develop any symptoms (ie: fever, flu-like symptoms, shortness of breath, cough etc.) before then, please call [phone].  If you test positive for Covid 19 in the 2 weeks post procedure, please call and report this information to us.    If any biopsies were taken you will be contacted by phone or by letter within the next 1-3 weeks.  Please call us at [phone] if you have not heard about the biopsies in 3 weeks.    SIGNATURES/CONFIDENTIALITY: You and/or your care partner have signed paperwork which will be entered into your electronic medical record.  These signatures attest to the fact that that the information above on your After Visit Summary has been reviewed and is understood.  Full responsibility of the confidentiality of this discharge information lies with you and/or your care-partner. 

## 2019-01-06 NOTE — Progress Notes (Signed)
PT taken to PACU. Monitors in place. VSS. Report given to RN. 

## 2019-01-06 NOTE — Op Note (Signed)
[place] Patient Name: Samantha Lutz Procedure Date: 01/06/2019 9:49 AM MRN: [id number] Endoscopist: [facility]. Loletha Carrow , MD Age: 68 Referring MD:  Date of Birth: 1950/07/15 Gender: Female Account #: [id number] Procedure:                Colonoscopy Indications:              High risk colon cancer surveillance: Personal                            history of rectal cancer (Dx 07/2017, neoadjuvant                            treatment, resection with temporary ileostomy                            11/2017, ileostomy takedown 07/2018) Medicines:                Monitored Anesthesia Care Procedure:                Pre-Anesthesia Assessment:                           - Prior to the procedure, a History and Physical                            was performed, and patient medications and                            allergies were reviewed. The patient's tolerance of                            previous anesthesia was also reviewed. The risks                            and benefits of the procedure and the sedation                            options and risks were discussed with the patient.                            All questions were answered, and informed consent                            was obtained. Prior Anticoagulants: The patient has                            taken no previous anticoagulant or antiplatelet                            agents. ASA Grade Assessment: II - A patient with                            mild systemic disease. After reviewing the risks  and benefits, the patient was deemed in                            satisfactory condition to undergo the procedure.                           After obtaining informed consent, the colonoscope                            was passed under direct vision. Throughout the                            procedure, the patient's blood pressure, pulse, and                            oxygen saturations were monitored  continuously. The                            Colonoscope was introduced through the anus and                            advanced to the the cecum, identified by                            appendiceal orifice and ileocecal valve. The                            colonoscopy was performed without difficulty. The                            patient tolerated the procedure well. The quality                            of the bowel preparation was excellent. The                            ileocecal valve, appendiceal orifice, and rectum                            were photographed. The bowel preparation used was                            SUPREP. Scope In: 9:55:21 AM Scope Out: 10:04:03 AM Scope Withdrawal Time: 0 hours 6 minutes 36 seconds  Total Procedure Duration: 0 hours 8 minutes 42 seconds  Findings:                 A post-surgical anastomosis was found on digital                            exam. 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  There is no endoscopic evidence of polyps in the                            entire colon.                           There was evidence of a prior end-to-end                            colo-rectal anastomosis in the distal rectum (just                            proximal to the anal verge). This was patent and                            was characterized by mildly friable mucosa, any                            otherwise healthy appearing. The previously-placed                            tattoo was present. The anastomosis was traversed.                           Retroflexion in the rectum was not performed due to                            post-surgical anatomy. Complications:            No immediate complications. Estimated Blood Loss:     Estimated blood loss: none. Impression:               - Patent post-surgical anastomosis found on digital                             exam.                           - Patent end-to-end colo-rectal anastomosis,                            characterized by mildly friable mucosa.                           - No specimens collected. Recommendation:           - Patient has a contact number available for                            emergencies. The signs and symptoms of potential                            delayed complications were discussed with the                            patient. Return to normal activities tomorrow.  Written discharge instructions were provided to the                            patient.                           - Resume previous diet.                           - Continue present medications.                           - Repeat colonoscopy in 3 years for surveillance. Linea Calles L. Loletha Carrow, MD 01/06/2019 10:12:07 AM This report has been signed electronically. CC Letter to:             [name], MD                           Betsy Coder, MD

## 2019-01-08 ENCOUNTER — Telehealth: Payer: Self-pay

## 2019-01-08 NOTE — Telephone Encounter (Signed)
Second post procedure follow up call, no answer 

## 2019-01-08 NOTE — Telephone Encounter (Signed)
First post procedure follow up call, no answer 

## 2019-04-08 ENCOUNTER — Ambulatory Visit
Admission: RE | Admit: 2019-04-08 | Discharge: 2019-04-08 | Disposition: A | Discharge status: Home / Self Care | Payer: Medicare Other | Source: Ambulatory Visit | Attending: Oncology | Admitting: Oncology

## 2019-04-08 ENCOUNTER — Other Ambulatory Visit: Payer: Self-pay

## 2019-04-08 ENCOUNTER — Inpatient Hospital Stay (HOSPITAL_BASED_OUTPATIENT_CLINIC_OR_DEPARTMENT_OTHER): Payer: Medicare Other | Admitting: Oncology

## 2019-04-08 ENCOUNTER — Telehealth: Payer: Self-pay | Admitting: Oncology

## 2019-04-08 ENCOUNTER — Inpatient Hospital Stay: Payer: Medicare Other | Attending: Oncology

## 2019-04-08 VITALS — BP 190/92 | HR 73 | Temp 98.3°F | Resp 20 | Ht 63.0 in | Wt 199.4 lb

## 2019-04-08 DIAGNOSIS — R918 Other nonspecific abnormal finding of lung field: Secondary | ICD-10-CM | POA: Insufficient documentation

## 2019-04-08 DIAGNOSIS — C20 Malignant neoplasm of rectum: Secondary | ICD-10-CM

## 2019-04-08 DIAGNOSIS — E039 Hypothyroidism, unspecified: Secondary | ICD-10-CM | POA: Diagnosis not present

## 2019-04-08 LAB — CEA (IN HOUSE-CHCC): CEA (CHCC-In House): <1 ng/mL (ref 0.00–5.00)

## 2019-04-08 NOTE — Telephone Encounter (Signed)
Scheduled per los. Patient declined printout  

## 2019-04-08 NOTE — Progress Notes (Signed)
Samantha Lutz OFFICE PROGRESS NOTE   Diagnosis: Rectal cancer INTERVAL HISTORY:   Samantha Lutz returns as scheduled.  She feels well.  Good appetite.  No complaint.  Objective:  Vital signs in last 24 hours:  Blood pressure (!) 192/99, pulse 73, temperature 98.3 F (36.8 C), temperature source Temporal, resp. rate 20, height '5\' 3"'  (1.6 m), weight 199 lb 6.4 oz (90.4 kg), SpO2 100 %.     Lymphatics: No cervical, supraclavicular, axillary, or inguinal nodes  GI: No hepatosplenomegaly, no mass, nontender Vascular: No leg edema   Lab Results:  Lab Results  Component Value Date   WBC 5.2 12/10/2018   HGB 11.6 (L) 12/10/2018   HCT 36.3 12/10/2018   MCV 88.1 12/10/2018   PLT 130 (L) 12/10/2018   NEUTROABS 4.0 12/10/2018    CMP  Lab Results  Component Value Date   NA 142 11/01/2018   K 4.1 11/01/2018   CL 106 11/01/2018   CO2 27 11/01/2018   GLUCOSE 81 11/01/2018   BUN 24 (H) 11/01/2018   CREATININE 0.94 11/01/2018   CALCIUM 9.2 11/01/2018   PROT 6.9 11/01/2018   ALBUMIN 3.9 11/01/2018   AST 15 11/01/2018   ALT 8 11/01/2018   ALKPHOS 52 11/01/2018   BILITOT 0.4 11/01/2018   GFRNONAA >60 08/06/2018   GFRAA >60 08/06/2018    Lab Results  Component Value Date   CEA1 <1.00 08/06/2018    No results found for: INR  Imaging:  CT Chest Wo Contrast  Result Date: 04/08/2019 CLINICAL DATA:  Rectal cancer. Follow-up pulmonary nodules. Status post chemotherapy and radiation therapy. EXAM: CT CHEST WITHOUT CONTRAST TECHNIQUE: Multidetector CT imaging of the chest was performed following the standard protocol without IV contrast. COMPARISON:  12/10/2018 FINDINGS: Cardiovascular: Aortic atherosclerosis. Normal heart size, without pericardial effusion. Lad coronary artery calcification. Mediastinum/Nodes: No mediastinal or definite hilar adenopathy, given limitations of unenhanced CT. Tiny hiatal hernia. Lungs/Pleura: No pleural fluid. Bilateral pulmonary  nodules. Index right lower lobe pulmonary nodule measures 9 mm on 95/3 versus 7 mm on the prior exam (when remeasured). A more anterior and lateral 1.3 cm right lower lobe nodule on 91/3 and is enlarged from 1.1 cm on the prior exam (when remeasured). More inferior and medial right lower lobe 1.0 cm nodule on 105/3 measured 8 mm on the prior exam. Right middle lobe 6 mm nodule on 75/3 measures 4 mm on the prior exam. Upper Abdomen: Multiple small gallstones. Normal imaged portions of the liver, spleen, pancreas, adrenal glands, kidneys. Musculoskeletal: No acute osseous abnormality. IMPRESSION: 1. Mild progression of pulmonary metastasis. 2. Cholelithiasis. 3. Tiny hiatal hernia. 4. Aortic Atherosclerosis (ICD10-I70.0). 5. Coronary artery atherosclerosis. Electronically Signed   By: Abigail Miyamoto M.D.   On: 04/08/2019 10:59    Medications: I have reviewed the patient's current medications.   Assessment/Plan: 1. Rectal cancer ? Mass at 7 cm from the anal verge on colonoscopy 08/09/2017, biopsy revealed invasive adenocarcinoma ? Staging CTs 08/17/2017-no evidence of metastatic disease, asymmetric thickening in the mid rectum ? MR pelvis 09/01/2017, T3N0 lesion beginning at 6.3 cm from the anal sphincter ? Radiation/Xeloda initiated 09/17/2017, completed 10/25/2017 ? 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  Lymphovascular invasion present, intact mismatch repair protein expression, treatment effect present (TRS 1) ? Cycle 1 adjuvant Xeloda beginning 01/21/2018 ? Cycle 2 adjuvant Xeloda beginning 02/11/2018 ? Xeloda discontinued after cycle 2 secondary to patient preference ? Ileostomy takedown 07/10/2018 ? CTs 09/07/2018- multiple live small pulmonary nodules concerning for  metastatic disease ? CT chest 12/10/2018-multiple bilateral lung nodules, some have increased in size ? CT chest 04/08/2019-mild enlargement of bilateral lung nodules  2.Hypothyroid  3.    History of  mild thrombocytopenia secondary to chemotherapy and radiation    Disposition: Samantha Lutz appears unchanged.  The restaging chest CT reveals enlargement of bilateral lung nodules.  I see no new nodules.  There is no other clinical or radiologic evidence of metastatic disease.  She is asymptomatic.  We discussed treatment options including observation, referral to consider SBRT, and systemic therapy.  She would like to discuss the radiation option with Dr. Lisbeth Renshaw.  We will make referral.  I will present her case at the GI tumor conference next week.  She will return for an office visit in 6 weeks.  The lesions appear too small to biopsy.  I will discuss molecular testing with her at the next visit and request foundation 1 testing on the 2019 resected tumor.  Betsy Coder, MD  04/08/2019  11:32 AM

## 2019-04-15 ENCOUNTER — Ambulatory Visit
Admission: RE | Admit: 2019-04-15 | Discharge: 2019-04-15 | Disposition: A | Discharge status: Home / Self Care | Payer: Medicare Other | Source: Ambulatory Visit | Attending: Radiation Oncology | Admitting: Radiation Oncology

## 2019-04-15 DIAGNOSIS — C7800 Secondary malignant neoplasm of unspecified lung: Secondary | ICD-10-CM | POA: Insufficient documentation

## 2019-04-15 DIAGNOSIS — C20 Malignant neoplasm of rectum: Secondary | ICD-10-CM

## 2019-04-15 DIAGNOSIS — C7801 Secondary malignant neoplasm of right lung: Secondary | ICD-10-CM

## 2019-04-15 NOTE — Addendum Note (Signed)
Encounter addended by: Hayden Pedro, PA-C on: 04/15/2019 4:56 PM  Actions taken: Clinical Note Signed

## 2019-04-15 NOTE — Progress Notes (Addendum)
Radiation Oncology         [phone] ________________________________ Outpatient Reconsultation - Conducted via telephone due to current COVID-19 concerns for limiting patient exposure  I spoke with the patient to conduct this consult visit via telephone to spare the patient unnecessary potential exposure in the healthcare setting during the current COVID-19 pandemic. The patient was notified in advance and was offered a [place] meeting to allow for face to face communication but unfortunately reported that they did not have the appropriate resources/technology to support such a visit and instead preferred to proceed with a telephone consult.   Name: Samantha Lutz MRN: [id number]  Date of Service: 04/15/2019 DOB: [birth year]   CC: Samantha Salvage, FNP  Samantha Lutz,*  Diagnosis:  Stage IIA, cT3N0M0 adenocarcinoma of the rectum     Interval Since Last Radiation:  1 year, 6 months  09/17/2017 - 10/25/2017: The rectum was initially treated to 45 Gy in 25 fractions, followed by a 5.4 Gy boost delivered in 3 fractions, to yield a final total dose of 50.4 Gy.  Narrative: In summary this is a pleasant 69 year old woman who was diagnosed with stage II adenocarcinoma of the rectum in the summer 2019 who received chemoradiation followed by low anterior resection and diverting ileostomy on 12/21/2017.  She began adjuvant Xeloda in December 2019, this was discontinued 2 months later secondary to patient preference and she proceeded with ileostomy takedown on 07/10/2018.   She had been doing well but in systemic imaging was found to have multiple small pulmonary nodules in the right lung in August 2020.  She return for repeat imaging on 12/10/2018, had 5 nodules in the right lung, a 8 mm right lower lobe nodule that had been previously been 6 mm, a 1.1 cm nodule that had previously been 9 mm, a 5 mm lesion that had previously been 4 mm, and a 5 mm lesion that had also previously been 3 mm.   In the right middle lobe  the nodule seen was 4 mm, previously 2 mm.  Repeat CT imaging on 04/08/2019 revealed 4 nodules, the right lower lobe measuring 9 mm prior it had been 7, more anterior and lateral 1.3 cm right lower lobe nodule previously 1.1 cm, a more inferior and medial right lower lobe nodule measuring 1 cm previously 8 mm, and the right middle lobe nodule measuring 6 mm compared to 4 mm previously.  She is contacted today to discuss options of stereotactic body radiotherapy (SBRT) as she is not interested in resuming systemic therapy.   PAST MEDICAL HISTORY:  Past Medical History:  Diagnosis Date  . Anemia    remote hx.  . Arthritis    Bi lat knee  . Cancer ([place]) 07/2017   Rectal Cancer   . Fatty liver    ultrasound December 2012  . Hypothyroid 01/16/2013 dx  . Irregular heartbeat    PER PATIENT REPORT; ONSET 20+YEARS AGO SAW CARDIOLOGY AT THE TIME C/O "ITS BEAT REALLY FAST FOR A MINUTE AND THEN STOPPED " DENIES ANY OTHER CARDIAC SX';  REPORTS CARDIO DID ECHO WHICH WAS NEGATIVE;  NOW COMES AND GOES ;   . Lichen sclerosus    steroids as needed  . Temporary low platelet count ([place])    SEE LAST LABS IN EPIC    PAST SURGICAL HISTORY: Past Surgical History:  Procedure Laterality Date  . APPENDECTOMY  1967  . [facility]  . COLONOSCOPY  08/09/2017   danis -  polyps  . ILEOSTOMY CLOSURE N/A 07/10/2018   Procedure: ILEOSTOMY LOOP TAKEDOWN WITH TAP BLOCK;  Surgeon: [name], MD;  Location: [facility];  Service: General;  Laterality: N/A;  . OSTOMY N/A 12/21/2017   Procedure: DIVERTING LOOP OSTOMY;  Surgeon: [name], MD;  Location: [facility];  Service: General;  Laterality: N/A;  . PROCTOSCOPY N/A 12/21/2017   Procedure: RIGID PROCTOSCOPY;  Surgeon: [name], MD;  Location: [facility];  Service: General;  Laterality: N/A;  . XI ROBOTIC ASSISTED LOWER ANTERIOR RESECTION N/A 12/21/2017   Procedure: XI ROBOTIC ASSISTED LOWER ANTERIOR RESECTION;  Surgeon:  [name], MD;  Location: [facility];  Service: General;  Laterality: N/A;    PAST SOCIAL HISTORY:  Social History   Socioeconomic History  . Marital status: Married    Spouse name: Not on file  . Number of children: Not on file  . Years of education: Not on file  . Highest education level: Not on file  Occupational History  . Not on file  Tobacco Use  . Smoking status: Never Smoker  . Smokeless tobacco: Never Used  Substance and Sexual Activity  . Alcohol use: No  . Drug use: No  . Sexual activity: Yes    Birth control/protection: Post-menopausal  Other Topics Concern  . Not on file  Social History Narrative  . Not on file   Social Determinants of Health   Financial Resource Strain:   . Difficulty of Paying Living Expenses:   Food Insecurity:   . Worried About Charity fundraiser in the Last Year:   . Arboriculturist in the Last Year:   Transportation Needs:   . Film/video editor (Medical):   Marland Kitchen Lack of Transportation (Non-Medical):   Physical Activity:   . Days of Exercise per Week:   . Minutes of Exercise per Session:   Stress:   . Feeling of Stress :   Social Connections:   . Frequency of Communication with Friends and Family:   . Frequency of Social Gatherings with Friends and Family:   . Attends Religious Services:   . Active Member of Clubs or Organizations:   . Attends Archivist Meetings:   Marland Kitchen Marital Status:   Intimate Partner Violence:   . Fear of Current or Ex-Partner:   . Emotionally Abused:   Marland Kitchen Physically Abused:   . Sexually Abused:     PAST FAMILY HISTORY: Family History  Problem Relation Age of Onset  . Diabetes Mother   . Hypertension Mother   . COPD Mother   . Glaucoma Father 31  . Stroke Maternal Grandmother 56  . Hypertension Maternal Grandmother   . Rheum arthritis Sister 58  . Hypertension Daughter   . Breast cancer Cousin   . Colon cancer Neg Hx   . Esophageal cancer Neg Hx   . Liver cancer Neg Hx   .  Pancreatic cancer Neg Hx   . Stomach cancer Neg Hx   . Rectal cancer Neg Hx     MEDICATIONS  Current Outpatient Medications  Medication Sig Dispense Refill  . levothyroxine (SYNTHROID) 50 MCG tablet Take 1 tablet (50 mcg total) by mouth daily. Needs visit for future refills. 90 tablet 0   No current facility-administered medications for this encounter.    ALLERGIES:  Allergies  Allergen Reactions  . Epinephrine Palpitations  . Losartan Hives    Physical Findings: Unable to assess due to encounter type.  Radiographic Findings: CT Chest Wo Contrast  Result Date: 04/08/2019 CLINICAL DATA:  Rectal cancer. Follow-up pulmonary nodules. Status post chemotherapy and radiation therapy. EXAM: CT CHEST WITHOUT CONTRAST TECHNIQUE: Multidetector CT imaging of the chest was performed following the standard protocol without IV contrast. COMPARISON:  12/10/2018 FINDINGS: Cardiovascular: Aortic atherosclerosis. Normal heart size, without pericardial effusion. Lad coronary artery calcification. Mediastinum/Nodes: No mediastinal or definite hilar adenopathy, given limitations of unenhanced CT. Tiny hiatal hernia. Lungs/Pleura: No pleural fluid. Bilateral pulmonary nodules. Index right lower lobe pulmonary nodule measures 9 mm on 95/3 versus 7 mm on the prior exam (when remeasured). A more anterior and lateral 1.3 cm right lower lobe nodule on 91/3 and is enlarged from 1.1 cm on the prior exam (when remeasured). More inferior and medial right lower lobe 1.0 cm nodule on 105/3 measured 8 mm on the prior exam. Right middle lobe 6 mm nodule on 75/3 measures 4 mm on the prior exam. Upper Abdomen: Multiple small gallstones. Normal imaged portions of the liver, spleen, pancreas, adrenal glands, kidneys. Musculoskeletal: No acute osseous abnormality. IMPRESSION: 1. Mild progression of pulmonary metastasis. 2. Cholelithiasis. 3. Tiny hiatal hernia. 4. Aortic Atherosclerosis (ICD10-I70.0). 5. Coronary artery  atherosclerosis. Electronically Signed   By: Abigail Miyamoto M.D.   On: 04/08/2019 10:59    Impression/Plan: 1. 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  Dr. Lisbeth Renshaw discusses the patient's history, her imaging, and reviews the nature of recurrent disease in the lungs. He reviews that the findings appear consistent with recurrent disease, and that localized therapy with radiation may help prevent further progression in this site. She is aware of the risk of progression in other parts of the body, but wishes to avoid chemotherapy at this time. Dr. Lisbeth Renshaw does not feel strongly that tissue confirmation would be necessary but reviews the multiple interval images showing progressive change in the 4 lesions and would offer a course of 5 fractions of stereotactic body radiotherapy (SBRT) to the three RUL lesions as a single isocenter, and the RML lesion as a separate site.  We discussed the risks, benefits, short, and long term effects of radiotherapy, and the patient is interested in proceeding. Dr. Lisbeth Renshaw discusses the delivery and logistics of radiotherapy. She will not need fiducial marker placement. She will return on 04/23/19 for simulation at which time she will need sign consent to proceed. She is also aware of the plan to be followed long term with Dr. Benay Spice.  2. Left lung nodule. On personal review, Dr. Lisbeth Renshaw sees a small lesion in the left lung and this will be discussed tomorrow in conference but would anticipate following this expectantly.   Given current concerns for patient exposure during the COVID-19 pandemic, this encounter was conducted via telephone.  The patient has provided two factor identification and has given verbal consent for this type of encounter and has been advised to only accept a meeting of this type in a secure network environment. The time spent during this encounter was 45 minutes including preparation, discussion, and  coordination of the patient's care. The attendants for this meeting include Blenda Nicely, RN, Dr. Lisbeth Renshaw, Hayden Pedro  and [place].  During the encounter,  Blenda Nicely, RN, Dr. Lisbeth Renshaw, and Hayden Pedro were located at [facility] Radiation Oncology Department.  Samantha Lutz was located at home.     Carola Rhine, PAC

## 2019-04-16 ENCOUNTER — Other Ambulatory Visit: Payer: Self-pay

## 2019-04-18 ENCOUNTER — Other Ambulatory Visit: Payer: Self-pay | Admitting: Oncology

## 2019-04-18 NOTE — Progress Notes (Signed)
Samantha Lutz case was presented at the GI tumor conference on 04/16/2019.  She has multiple lung nodules.  She is felt to be a candidate for palliative SBRT, though this is unlikely to be curative and there are probably small nodules that will not be treated.  She understands the radiation will not be curative.  The goal of radiation is to increase the symptom free interval and delay the need for additional systemic therapy.  I discussed the the options of radiation, observation, and systemic therapy with Samantha Lutz.  She has discussed options with Dr. Lisbeth Renshaw and would like to proceed with radiation.  She will return for an office visit here as scheduled next month.

## 2019-04-23 ENCOUNTER — Other Ambulatory Visit: Payer: Self-pay

## 2019-04-23 ENCOUNTER — Ambulatory Visit
Admission: RE | Admit: 2019-04-23 | Discharge: 2019-04-23 | Disposition: A | Discharge status: Home / Self Care | Payer: Medicare Other | Source: Ambulatory Visit | Attending: Radiation Oncology | Admitting: Radiation Oncology

## 2019-04-23 DIAGNOSIS — C20 Malignant neoplasm of rectum: Secondary | ICD-10-CM | POA: Insufficient documentation

## 2019-04-23 DIAGNOSIS — C7800 Secondary malignant neoplasm of unspecified lung: Secondary | ICD-10-CM | POA: Insufficient documentation

## 2019-05-05 DIAGNOSIS — C20 Malignant neoplasm of rectum: Secondary | ICD-10-CM | POA: Diagnosis not present

## 2019-05-05 DIAGNOSIS — C7800 Secondary malignant neoplasm of unspecified lung: Secondary | ICD-10-CM | POA: Insufficient documentation

## 2019-05-06 ENCOUNTER — Other Ambulatory Visit: Payer: Self-pay

## 2019-05-06 ENCOUNTER — Ambulatory Visit
Admission: RE | Admit: 2019-05-06 | Discharge: 2019-05-06 | Disposition: A | Discharge status: Home / Self Care | Payer: Medicare Other | Source: Ambulatory Visit | Attending: Radiation Oncology | Admitting: Radiation Oncology

## 2019-05-06 DIAGNOSIS — C7800 Secondary malignant neoplasm of unspecified lung: Secondary | ICD-10-CM | POA: Diagnosis not present

## 2019-05-07 ENCOUNTER — Other Ambulatory Visit: Payer: Self-pay | Admitting: Radiation Therapy

## 2019-05-08 ENCOUNTER — Other Ambulatory Visit: Payer: Self-pay

## 2019-05-08 ENCOUNTER — Ambulatory Visit
Admission: RE | Admit: 2019-05-08 | Discharge: 2019-05-08 | Disposition: A | Discharge status: Home / Self Care | Payer: Medicare Other | Source: Ambulatory Visit | Attending: Radiation Oncology | Admitting: Radiation Oncology

## 2019-05-08 DIAGNOSIS — C7800 Secondary malignant neoplasm of unspecified lung: Secondary | ICD-10-CM | POA: Diagnosis not present

## 2019-05-12 ENCOUNTER — Ambulatory Visit: Payer: Medicare Other | Admitting: Radiation Oncology

## 2019-05-13 ENCOUNTER — Encounter: Payer: Self-pay | Admitting: Radiation Oncology

## 2019-05-13 ENCOUNTER — Ambulatory Visit
Admission: RE | Admit: 2019-05-13 | Discharge: 2019-05-13 | Disposition: A | Discharge status: Home / Self Care | Payer: Medicare Other | Source: Ambulatory Visit | Attending: Radiation Oncology | Admitting: Radiation Oncology

## 2019-05-13 ENCOUNTER — Other Ambulatory Visit: Payer: Self-pay

## 2019-05-13 DIAGNOSIS — C7800 Secondary malignant neoplasm of unspecified lung: Secondary | ICD-10-CM | POA: Diagnosis not present

## 2019-05-13 NOTE — Progress Notes (Signed)
Patient came around following her final radiation treatment.  She states she has some fatigue but otherwise feels good.  She will be followed by medical oncology.  Will continue to follow as necessary.  BP (!) 152/84 (BP Location: Left Arm)   Pulse 88   Temp 98.2 F (36.8 C)   Resp 18   LMP  (LMP Unknown)   SpO2 99%    Daquann Merriott M. Leonie Green, BSN

## 2019-05-13 NOTE — Progress Notes (Signed)
  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  09/17/2017 - 10/25/2017: The rectum was initially treated to 45 Gy in 25 fractions, followed by a 5.4 Gy boost delivered in 3 fractions, to yield a final total dose of 50.4 Gy.  Narrative:  The patient was contacted today for routine follow-up. During treatment she did very well with radiotherapy and did not have significant desquamation. She reports she is doing well with some mild fatigue. She is also having some mild upper back discomfort that comes and goes.  Impression/Plan: 1.         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 The patient has done well tolerating radiotherapy. She does have some discomfort in her right upper back that comes and goes and she will let us know if it worsens. I think it is less likely a radiotherapy related change but we will follow up with her in one month to see how she's feeling. She will follow up with medical oncology next week as well. 2.         Left lung nodule. On personal review, Dr. Lisbeth Renshaw previously saw a small lesion in the left lung and this will be followed expectantly.     Carola Rhine, PAC

## 2019-05-19 ENCOUNTER — Other Ambulatory Visit: Payer: Self-pay | Admitting: Family

## 2019-05-19 MED ORDER — LEVOTHYROXINE SODIUM 50 MCG PO TABS: 50.0000 ug | ORAL_TABLET | Freq: Every day | ORAL | 1 refills | Status: DC

## 2019-05-20 ENCOUNTER — Encounter: Payer: Self-pay | Admitting: Nurse Practitioner

## 2019-05-20 ENCOUNTER — Inpatient Hospital Stay: Payer: Medicare Other | Attending: Oncology | Admitting: Nurse Practitioner

## 2019-05-20 ENCOUNTER — Other Ambulatory Visit: Payer: Self-pay

## 2019-05-20 VITALS — BP 145/95 | HR 82 | Temp 98.7°F | Resp 18 | Ht 63.0 in | Wt 204.1 lb

## 2019-05-20 DIAGNOSIS — D6959 Other secondary thrombocytopenia: Secondary | ICD-10-CM | POA: Diagnosis not present

## 2019-05-20 DIAGNOSIS — C20 Malignant neoplasm of rectum: Secondary | ICD-10-CM | POA: Diagnosis not present

## 2019-05-20 DIAGNOSIS — Z79899 Other long term (current) drug therapy: Secondary | ICD-10-CM | POA: Diagnosis not present

## 2019-05-20 DIAGNOSIS — Z923 Personal history of irradiation: Secondary | ICD-10-CM | POA: Diagnosis not present

## 2019-05-20 DIAGNOSIS — E039 Hypothyroidism, unspecified: Secondary | ICD-10-CM | POA: Diagnosis not present

## 2019-05-20 DIAGNOSIS — Z9221 Personal history of antineoplastic chemotherapy: Secondary | ICD-10-CM | POA: Insufficient documentation

## 2019-05-20 DIAGNOSIS — T451X5A Adverse effect of antineoplastic and immunosuppressive drugs, initial encounter: Secondary | ICD-10-CM | POA: Diagnosis not present

## 2019-05-20 NOTE — Progress Notes (Addendum)
  [place] OFFICE PROGRESS NOTE   Diagnosis: Rectal cancer  INTERVAL HISTORY:   Samantha Lutz returns as scheduled.  She feels well.  No fever, cough, shortness of breath.  Bowels moving regularly.  She has a good appetite.  No pain.  Objective:  Vital signs in last 24 hours:  Blood pressure (!) 145/95, pulse 82, temperature 98.7 F (37.1 C), temperature source Temporal, resp. rate 18, height 5' 3" (1.6 m), weight 204 lb 1.6 oz (92.6 kg), SpO2 99 %.    HEENT: Neck without mass. Lymphatics: No palpable cervical, supraclavicular, axillary or inguinal lymph nodes. Resp: Lungs clear bilaterally. Cardio: Regular rate and rhythm. GI: Abdomen soft and nontender.  No hepatomegaly. Vascular: No leg edema.   Lab Results:  Lab Results  Component Value Date   WBC 5.2 12/10/2018   HGB 11.6 (L) 12/10/2018   HCT 36.3 12/10/2018   MCV 88.1 12/10/2018   PLT 130 (L) 12/10/2018   NEUTROABS 4.0 12/10/2018    Imaging:  No results found.  Medications: I have reviewed the patient's current medications.  Assessment/Plan: 1. Rectal cancer ? Mass at 7 cm from the anal verge on colonoscopy 08/09/2017, biopsy revealed invasive adenocarcinoma ? Staging CTs 08/17/2017-no evidence of metastatic disease, asymmetric thickening in the mid rectum ? MR pelvis 09/01/2017, T3N0 lesion beginning at 6.3 cm from the anal sphincter ? Radiation/Xeloda initiated 09/17/2017, completed 10/25/2017 ? Low anterior resection/diverting ileostomy 12/21/2017,ypT3,ypN1atumor. Lymphovascular invasion present, intact mismatch repair protein expression, treatment effect present (TRS 1) ? Cycle 1 adjuvant Xeloda beginning 01/21/2018 ? Cycle 2 adjuvant Xeloda beginning 02/11/2018 ? Xeloda discontinued after cycle 2 secondary to patient preference ? Ileostomy takedown 07/10/2018 ? CTs 09/07/2018- multiple live small pulmonary nodules concerning for metastatic disease ? CT chest 12/10/2018-multiple bilateral  lung nodules, some have increased in size ? CT chest 04/08/2019-mild enlargement of bilateral lung nodules ? Status post SBRT multiple lung nodules 05/06/2019, 05/08/2019, 05/13/2019  2.Hypothyroid  3.  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  She completed the course of SBRT to multiple lung nodules 1 week ago.  Plan to follow with observation with a restaging chest CT in approximately 4 months.  She agrees with this plan.  She will return for a follow-up visit a few days after the chest CT to review the results.  Patient seen with Dr. Benay Spice.  Ned Card ANP/GNP-BC   05/20/2019  12:07 PM This was a shared visit with Ned Card.  Samantha Lutz appears stable.  She has completed SBRT to lung nodules.  We will see her back with a restaging CT in 3-4 months.  Julieanne Manson, MD

## 2019-05-21 ENCOUNTER — Telehealth: Payer: Self-pay | Admitting: Nurse Practitioner

## 2019-05-21 NOTE — Telephone Encounter (Signed)
Scheduled per los. Called and spoke with patient. Confirmed appt 

## 2019-05-26 ENCOUNTER — Telehealth: Payer: Self-pay | Admitting: Family

## 2019-05-26 MED ORDER — LEVOTHYROXINE SODIUM 50 MCG PO TABS: 50.0000 ug | ORAL_TABLET | Freq: Every day | ORAL | 1 refills | Status: DC

## 2019-05-26 NOTE — Telephone Encounter (Signed)
Resent rx to [facility] w/[address].Marland KitchenJohny Chess

## 2019-05-26 NOTE — Telephone Encounter (Signed)
New Message:   1.Medication Requested: levothyroxine (SYNTHROID) 50 MCG tablet 2. Pharmacy (Name, [address], [place]):  3. On Med List: Yes  4. Last Visit with PCP:  11/01/18  5. Next visit date with PCP: none   Agent: Please be advised that RX refills may take up to 3 business days. We ask that you follow-up with your pharmacy.

## 2019-06-05 ENCOUNTER — Telehealth: Payer: Self-pay | Admitting: Radiation Oncology

## 2019-06-05 NOTE — Telephone Encounter (Signed)
I called the patient and left a voicemail to check on how she was doing and let her know I have plans to follow up with her after her next CT scan by phone as we are following other areas of her lungs. I encouraged her to call me back if she would like to discuss further.

## 2019-06-18 NOTE — Progress Notes (Signed)
  Radiation Oncology         ([phone]) [phone] ________________________________  Name: Samantha Lutz MRN: [id number]  Date: 05/13/2019  DOB: [birth year]  End of Treatment Note  Diagnosis:   Stage IV rectal cancer   Indication for treatment::  curative       Radiation treatment dates:   05/06/19 - 05/13/19  Site/dose:   The patient was treated to the right lung with a course of stereotactic body radiation treatment.  The patient received 54 Gray in 3 fractions using a IMRT/ SBRT technique, with 5 fields.  Narrative: The patient tolerated radiation treatment relatively well.   No unexpected difficulties.  The patient's breathing did not significantly change during the course of the treatment.  Plan: The patient has completed radiation treatment. The patient will return to radiation oncology clinic for routine followup in one month. I advised the patient to call or return sooner if they have any questions or concerns related to their recovery or treatment. ________________________________  Jodelle Gross, M.D., Ph.D.

## 2019-08-27 ENCOUNTER — Other Ambulatory Visit: Payer: Self-pay

## 2019-08-27 ENCOUNTER — Ambulatory Visit (HOSPITAL_COMMUNITY)
Admission: RE | Admit: 2019-08-27 | Discharge: 2019-08-27 | Disposition: A | Discharge status: Home / Self Care | Payer: Medicare Other | Source: Ambulatory Visit | Attending: Nurse Practitioner | Admitting: Nurse Practitioner

## 2019-08-27 DIAGNOSIS — C20 Malignant neoplasm of rectum: Secondary | ICD-10-CM | POA: Diagnosis present

## 2019-08-28 ENCOUNTER — Other Ambulatory Visit: Payer: Self-pay

## 2019-08-28 ENCOUNTER — Inpatient Hospital Stay: Payer: Medicare Other | Attending: Oncology | Admitting: Oncology

## 2019-08-28 ENCOUNTER — Telehealth: Payer: Self-pay | Admitting: Oncology

## 2019-08-28 VITALS — BP 175/74 | HR 90 | Temp 97.8°F | Resp 20 | Ht 63.0 in | Wt 208.0 lb

## 2019-08-28 DIAGNOSIS — Z79899 Other long term (current) drug therapy: Secondary | ICD-10-CM | POA: Diagnosis not present

## 2019-08-28 DIAGNOSIS — Z923 Personal history of irradiation: Secondary | ICD-10-CM | POA: Insufficient documentation

## 2019-08-28 DIAGNOSIS — M549 Dorsalgia, unspecified: Secondary | ICD-10-CM | POA: Diagnosis not present

## 2019-08-28 DIAGNOSIS — E039 Hypothyroidism, unspecified: Secondary | ICD-10-CM | POA: Insufficient documentation

## 2019-08-28 DIAGNOSIS — I2510 Atherosclerotic heart disease of native coronary artery without angina pectoris: Secondary | ICD-10-CM | POA: Diagnosis not present

## 2019-08-28 DIAGNOSIS — R05 Cough: Secondary | ICD-10-CM | POA: Diagnosis not present

## 2019-08-28 DIAGNOSIS — R918 Other nonspecific abnormal finding of lung field: Secondary | ICD-10-CM | POA: Insufficient documentation

## 2019-08-28 DIAGNOSIS — C20 Malignant neoplasm of rectum: Secondary | ICD-10-CM

## 2019-08-28 NOTE — Telephone Encounter (Signed)
Scheduled per 7/29 los. Printed avs and calendar for pt.  

## 2019-08-28 NOTE — Progress Notes (Signed)
[place] OFFICE PROGRESS NOTE   Diagnosis: Rectal cancer   INTERVAL HISTORY:   Samantha Lutz feels well.  Good appetite.  She has an occasional cough.  She has intermittent mild back pain.  No other complaint.  Objective:  Vital signs in last 24 hours:  Blood pressure (!) 175/74, pulse 90, temperature 97.8 F (36.6 C), temperature source Temporal, resp. rate 20, height '5\' 3"'  (1.6 m), weight (!) 208 lb (94.3 kg), SpO2 97 %.     Lymphatics: No cervical, supraclavicular, axillary, or inguinal nodes Resp: End inspiratory rhonchi at the right posterior base, no respiratory distress Cardio: Regular rate and rhythm GI: No hepatosplenomegaly, no mass, nontender Vascular: No leg edema   Lab Results:  Lab Results  Component Value Date   WBC 5.2 12/10/2018   HGB 11.6 (L) 12/10/2018   HCT 36.3 12/10/2018   MCV 88.1 12/10/2018   PLT 130 (L) 12/10/2018   NEUTROABS 4.0 12/10/2018    CMP  Lab Results  Component Value Date   NA 142 11/01/2018   K 4.1 11/01/2018   CL 106 11/01/2018   CO2 27 11/01/2018   GLUCOSE 81 11/01/2018   BUN 24 (H) 11/01/2018   CREATININE 0.94 11/01/2018   CALCIUM 9.2 11/01/2018   PROT 6.9 11/01/2018   ALBUMIN 3.9 11/01/2018   AST 15 11/01/2018   ALT 8 11/01/2018   ALKPHOS 52 11/01/2018   BILITOT 0.4 11/01/2018   GFRNONAA >60 08/06/2018   GFRAA >60 08/06/2018    Lab Results  Component Value Date   CEA1 <1.00 04/08/2019    No results found for: INR  Imaging:  CT Chest Wo Contrast  Result Date: 08/28/2019 CLINICAL DATA:  Follow-up of lung nodules. History of rectal cancer. Status post SBRT to right lung, finishing 05/13/2019 EXAM: CT CHEST WITHOUT CONTRAST TECHNIQUE: Multidetector CT imaging of the chest was performed following the standard protocol without IV contrast. COMPARISON:  04/08/2019 FINDINGS: Cardiovascular: Aortic atherosclerosis. Normal heart size, without pericardial effusion. Lad and left circumflex coronary  artery atherosclerosis. Mediastinum/Nodes: No supraclavicular adenopathy. No mediastinal or definite hilar adenopathy, given limitations of unenhanced CT. Tiny hiatal hernia. Lungs/Pleura: No pleural fluid.  Mild right pleural thickening. Multifocal areas of presumably radiation induced airspace and ground-glass opacity. These are primarily somewhat nodular in appearance. Example surrounding the right major fissure on 62/5. Significant improvement and resolution in the majority of right-sided pulmonary nodules. Example residual nodule measures 7 mm in the right lower lobe on 99/5, at the site of a 12 mm nodule on 94/3 of the prior exam. A superior segment right lower lobe 1.0 cm nodule on 82/5 has enlarged from 6 mm on the prior exam. No convincing evidence of new right-sided nodules. Progression of left-sided nodules. For example, left upper lobe 6 mm nodule on 60/5 measured 5 mm on 61/3 of the prior exam. A central lingular nodule of 11 mm on 69/5 measures 4 mm on the prior exam (71/3 of that exam). Posterior lingular subpleural nodule measures 6 mm on 76/5 versus 4 mm on the prior exam (when remeasured). Upper Abdomen: Multiple tiny gallstones. Normal imaged portions of the liver, spleen, pancreas, adrenal glands, kidneys. Musculoskeletal: No acute osseous abnormality. IMPRESSION: 1. Response to radiation therapy of right-sided pulmonary nodules, as detailed above. 2. Enlargement of left-sided nodules and a superior segment right lower lobe pulmonary nodule, consistent with progression of non treated metastasis. 3. No thoracic adenopathy. 4. Coronary artery atherosclerosis. Aortic Atherosclerosis (ICD10-I70.0). Electronically Signed   By:  Abigail Miyamoto M.D.   On: 08/28/2019 09:54    Medications: I have reviewed the patient's current medications.   Assessment/Plan: 1. Rectal cancer ? Mass at 7 cm from the anal verge on colonoscopy 08/09/2017, biopsy revealed invasive adenocarcinoma ? Staging CTs  08/17/2017-no evidence of metastatic disease, asymmetric thickening in the mid rectum ? MR pelvis 09/01/2017, T3N0 lesion beginning at 6.3 cm from the anal sphincter ? Radiation/Xeloda initiated 09/17/2017, completed 10/25/2017 ? Low anterior resection/diverting ileostomy 12/21/2017,ypT3,ypN1atumor. Lymphovascular invasion present, intact mismatch repair protein expression, treatment effect present (TRS 1) ? Cycle 1 adjuvant Xeloda beginning 01/21/2018 ? Cycle 2 adjuvant Xeloda beginning 02/11/2018 ? Xeloda discontinued after cycle 2 secondary to patient preference ? Ileostomy takedown 07/10/2018 ? CTs 09/07/2018- multiple live small pulmonary nodules concerning for metastatic disease ? CT chest 12/10/2018-multiple bilateral lung nodules, some have increased in size ? CT chest 04/08/2019-mild enlargement of bilateral lung nodules ? Status post SBRT multiple lung nodules 05/06/2019, 05/08/2019, 05/13/2019 ? CT chest 08/28/2019-improvement and resolution in majority of right-sided pulmonary nodules, a superior segment right lower lobe nodule has increased, no new right-sided nodules.  Progressive enlargement of left-sided pulmonary nodules  2.Hypothyroid  3.  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  There is no clinical evidence for progression of the metastatic rectal cancer, but the chest CT reveals enlargement of left-sided nodules and a single right-sided nodule.  It appears the enlarging right lower lobe nodule was not treated with SBRT.  The treated lung nodules appear to have responded to the SBRT.  I reviewed the CT images and discussed treatment options with Samantha Lutz.  She understands it is unlikely that any therapy will be curative.  We discussed observation, treating the remaining lung nodules with SBRT, and beginning systemic therapy.  She does not wish to be treated with chemotherapy at present.  She will  be referred for a restaging PET scan.  If there is no disease outside of the chest we will asked Dr. Lisbeth Renshaw to consider SBRT to the remaining lung lesions.  I will present her case at the GI tumor conference.  We will make a referral to Dr. Lisbeth Renshaw.  She will return for an office visit after the PET scan.  We will request foundation one testing on the rectal tumor.  Betsy Coder, MD  08/28/2019  1:11 PM

## 2019-09-03 ENCOUNTER — Other Ambulatory Visit: Payer: Self-pay

## 2019-09-09 ENCOUNTER — Encounter (HOSPITAL_COMMUNITY)
Admission: RE | Admit: 2019-09-09 | Discharge: 2019-09-09 | Disposition: A | Discharge status: Home / Self Care | Payer: Medicare Other | Source: Ambulatory Visit | Attending: Oncology | Admitting: Oncology

## 2019-09-09 ENCOUNTER — Other Ambulatory Visit: Payer: Self-pay

## 2019-09-09 DIAGNOSIS — K8020 Calculus of gallbladder without cholecystitis without obstruction: Secondary | ICD-10-CM | POA: Insufficient documentation

## 2019-09-09 DIAGNOSIS — C20 Malignant neoplasm of rectum: Secondary | ICD-10-CM | POA: Insufficient documentation

## 2019-09-09 DIAGNOSIS — C7800 Secondary malignant neoplasm of unspecified lung: Secondary | ICD-10-CM | POA: Diagnosis not present

## 2019-09-09 DIAGNOSIS — I700 Atherosclerosis of aorta: Secondary | ICD-10-CM | POA: Diagnosis not present

## 2019-09-09 DIAGNOSIS — K449 Diaphragmatic hernia without obstruction or gangrene: Secondary | ICD-10-CM | POA: Diagnosis not present

## 2019-09-09 DIAGNOSIS — C787 Secondary malignant neoplasm of liver and intrahepatic bile duct: Secondary | ICD-10-CM | POA: Diagnosis not present

## 2019-09-09 LAB — GLUCOSE, CAPILLARY: Glucose-Capillary: 103 mg/dL — ABNORMAL HIGH (ref 70–99)

## 2019-09-09 MED ORDER — FLUDEOXYGLUCOSE F - 18 (FDG) INJECTION
11.1000 | Freq: Once | INTRAVENOUS | Status: AC | PRN
  Administered 2019-09-09: 11.1 via INTRAVENOUS

## 2019-09-10 ENCOUNTER — Ambulatory Visit: Payer: Medicare Other

## 2019-09-10 ENCOUNTER — Ambulatory Visit: Payer: Medicare Other | Admitting: Radiation Oncology

## 2019-09-10 ENCOUNTER — Telehealth: Payer: Self-pay | Admitting: *Deleted

## 2019-09-10 NOTE — Telephone Encounter (Signed)
-----   Message from Ladell Pier, MD sent at 09/09/2019  9:29 PM EDT ----- Please call patient, PET shows known lung lesions and 1 suspicious liver lesion She will not be a candidate for radiation, will discuss options and review scans at appt. Next week Ok to cancel radiation appt.

## 2019-09-10 NOTE — Telephone Encounter (Addendum)
Left VM for patient to return call for PET results. Radiation appointment canceled per Dr. Benay Spice. Nari returned call and was informed of PET results and that given this , she is not a candidate for radiation therapy. MD will discuss scan in detail and plans for treatment next week. She understands and agrees.

## 2019-09-15 ENCOUNTER — Telehealth: Payer: Self-pay | Admitting: *Deleted

## 2019-09-15 ENCOUNTER — Other Ambulatory Visit: Payer: Self-pay

## 2019-09-15 ENCOUNTER — Inpatient Hospital Stay: Payer: Medicare Other | Attending: Oncology | Admitting: Oncology

## 2019-09-15 ENCOUNTER — Telehealth: Payer: Self-pay | Admitting: Oncology

## 2019-09-15 VITALS — BP 152/70 | HR 92 | Temp 97.8°F | Resp 18 | Ht 63.0 in | Wt 204.0 lb

## 2019-09-15 DIAGNOSIS — C20 Malignant neoplasm of rectum: Secondary | ICD-10-CM | POA: Insufficient documentation

## 2019-09-15 DIAGNOSIS — Z923 Personal history of irradiation: Secondary | ICD-10-CM | POA: Diagnosis not present

## 2019-09-15 DIAGNOSIS — C787 Secondary malignant neoplasm of liver and intrahepatic bile duct: Secondary | ICD-10-CM | POA: Diagnosis not present

## 2019-09-15 DIAGNOSIS — E039 Hypothyroidism, unspecified: Secondary | ICD-10-CM | POA: Insufficient documentation

## 2019-09-15 DIAGNOSIS — C7800 Secondary malignant neoplasm of unspecified lung: Secondary | ICD-10-CM | POA: Diagnosis not present

## 2019-09-15 NOTE — Progress Notes (Signed)
Per Dr Benay Spice email to [facility] requesting Foundation One Testing Case Number [id number] Dated 12/21/17.  DX is C20 Stage IV

## 2019-09-15 NOTE — Telephone Encounter (Signed)
Scheduled per 08/16 los, patient will be notified per my chart.

## 2019-09-15 NOTE — Progress Notes (Signed)
[facility] OFFICE PROGRESS NOTE   Diagnosis: Rectal cancer  INTERVAL HISTORY:   Samantha Lutz returns as scheduled.  She feels well.  No complaint.  She was scheduled for SBRT to remaining lung lesions, but a PET scan revealed a liver metastasis.  Objective:  Vital signs in last 24 hours:  Blood pressure (!) 152/70, pulse 92, temperature 97.8 F (36.6 C), temperature source Tympanic, resp. rate 18, height '5\' 3"'  (1.6 m), weight 204 lb (92.5 kg), SpO2 95 %.    Physical examination-not performed today  Lab Results:  Lab Results  Component Value Date   WBC 5.2 12/10/2018   HGB 11.6 (L) 12/10/2018   HCT 36.3 12/10/2018   MCV 88.1 12/10/2018   PLT 130 (L) 12/10/2018   NEUTROABS 4.0 12/10/2018    CMP  Lab Results  Component Value Date   NA 142 11/01/2018   K 4.1 11/01/2018   CL 106 11/01/2018   CO2 27 11/01/2018   GLUCOSE 81 11/01/2018   BUN 24 (H) 11/01/2018   CREATININE 0.94 11/01/2018   CALCIUM 9.2 11/01/2018   PROT 6.9 11/01/2018   ALBUMIN 3.9 11/01/2018   AST 15 11/01/2018   ALT 8 11/01/2018   ALKPHOS 52 11/01/2018   BILITOT 0.4 11/01/2018   GFRNONAA >60 08/06/2018   GFRAA >60 08/06/2018    Lab Results  Component Value Date   CEA1 <1.00 04/08/2019     Medications: I have reviewed the patient's current medications.   Assessment/Plan:  1. Rectal cancer ? Mass at 7 cm from the anal verge on colonoscopy 08/09/2017, biopsy revealed invasive adenocarcinoma ? Staging CTs 08/17/2017-no evidence of metastatic disease, asymmetric thickening in the mid rectum ? MR pelvis 09/01/2017, T3N0 lesion beginning at 6.3 cm from the anal sphincter ? Radiation/Xeloda initiated 09/17/2017, completed 10/25/2017 ? Low anterior resection/diverting ileostomy 12/21/2017,ypT3,ypN1atumor. Lymphovascular invasion present, intact mismatch repair protein expression, treatment effect present (TRS 1) ? Cycle 1 adjuvant Xeloda beginning 01/21/2018 ? Cycle 2 adjuvant  Xeloda beginning 02/11/2018 ? Xeloda discontinued after cycle 2 secondary to patient preference ? Ileostomy takedown 07/10/2018 ? CTs 09/07/2018- multiple live small pulmonary nodules concerning for metastatic disease ? CT chest 12/10/2018-multiple bilateral lung nodules, some have increased in size ? CT chest 04/08/2019-mild enlargement of bilateral lung nodules ? Status post SBRT multiple lung nodules 05/06/2019, 05/08/2019, 05/13/2019 ? CT chest 08/28/2019-improvement and resolution in majority of right-sided pulmonary nodules, a superior segment right lower lobe nodule has increased, no new right-sided nodules.  Progressive enlargement of left-sided pulmonary nodules ? PET scan 09/09/2019-hypermetabolic right lower lobe nodule, 2 hypermetabolic left upper lobe nodules with an additional 0.7 cm left upper lobe nodule below PET resolution, groundglass opacity in the mid to right lower lobe with associated hypermetabolism consistent with postradiation change, hypermetabolic central segment 4A liver lesion  2.Hypothyroid  3.  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  She completed SBRT to lung nodules earlier this year.  She has persistent disease in the lungs and further SBRT was planned after her case was discussed at the GI tumor conference.  However a staging PET scan reveals a segment 4 liver lesion.  I reviewed the PET images and discussed treatment options with Samantha Lutz.  I recommend systemic therapy.  She understands no therapy will be curative.  She agrees to referral for Port-A-Cath placement.  We will submit the 2019 tumor for next generation sequencing.  She will return  for an office visit and further discussion in approximately 2 weeks.  Betsy Coder, MD  09/15/2019  9:28 AM

## 2019-09-15 NOTE — Telephone Encounter (Signed)
Opened in error

## 2019-09-16 ENCOUNTER — Ambulatory Visit: Payer: Medicare Other

## 2019-09-16 ENCOUNTER — Ambulatory Visit: Payer: Medicare Other | Admitting: Radiation Oncology

## 2019-09-26 ENCOUNTER — Other Ambulatory Visit: Payer: Self-pay | Admitting: Radiology

## 2019-09-29 ENCOUNTER — Encounter (HOSPITAL_COMMUNITY): Payer: Self-pay | Admitting: Oncology

## 2019-09-29 ENCOUNTER — Other Ambulatory Visit: Payer: Self-pay | Admitting: Oncology

## 2019-09-29 ENCOUNTER — Ambulatory Visit (HOSPITAL_COMMUNITY)
Admission: RE | Admit: 2019-09-29 | Discharge: 2019-09-29 | Disposition: A | Discharge status: Home / Self Care | Payer: Medicare Other | Source: Ambulatory Visit | Attending: Oncology | Admitting: Oncology

## 2019-09-29 ENCOUNTER — Encounter (HOSPITAL_COMMUNITY): Payer: Self-pay

## 2019-09-29 ENCOUNTER — Other Ambulatory Visit: Payer: Self-pay

## 2019-09-29 DIAGNOSIS — Z888 Allergy status to other drugs, medicaments and biological substances status: Secondary | ICD-10-CM | POA: Diagnosis not present

## 2019-09-29 DIAGNOSIS — M170 Bilateral primary osteoarthritis of knee: Secondary | ICD-10-CM | POA: Insufficient documentation

## 2019-09-29 DIAGNOSIS — K760 Fatty (change of) liver, not elsewhere classified: Secondary | ICD-10-CM | POA: Diagnosis not present

## 2019-09-29 DIAGNOSIS — E039 Hypothyroidism, unspecified: Secondary | ICD-10-CM | POA: Diagnosis not present

## 2019-09-29 DIAGNOSIS — C20 Malignant neoplasm of rectum: Secondary | ICD-10-CM | POA: Diagnosis present

## 2019-09-29 DIAGNOSIS — Z79890 Hormone replacement therapy (postmenopausal): Secondary | ICD-10-CM | POA: Insufficient documentation

## 2019-09-29 DIAGNOSIS — Z803 Family history of malignant neoplasm of breast: Secondary | ICD-10-CM | POA: Insufficient documentation

## 2019-09-29 MED ORDER — LIDOCAINE-EPINEPHRINE 1 %-1:100000 IJ SOLN
INTRAMUSCULAR | Status: AC
  Filled 2019-09-29: qty 1

## 2019-09-29 MED ORDER — CEFAZOLIN SODIUM-DEXTROSE 2-4 GM/100ML-% IV SOLN: 2.0000 g | Freq: Once | INTRAVENOUS | Status: AC

## 2019-09-29 MED ORDER — FENTANYL CITRATE (PF) 100 MCG/2ML IJ SOLN
INTRAMUSCULAR | Status: AC | PRN
  Administered 2019-09-29 (×2): 50 ug via INTRAVENOUS

## 2019-09-29 MED ORDER — FENTANYL CITRATE (PF) 100 MCG/2ML IJ SOLN
INTRAMUSCULAR | Status: AC
  Filled 2019-09-29: qty 2

## 2019-09-29 MED ORDER — MIDAZOLAM HCL 2 MG/2ML IJ SOLN
INTRAMUSCULAR | Status: AC
  Filled 2019-09-29: qty 4

## 2019-09-29 MED ORDER — MIDAZOLAM HCL 2 MG/2ML IJ SOLN
INTRAMUSCULAR | Status: AC | PRN
  Administered 2019-09-29 (×3): 1 mg via INTRAVENOUS

## 2019-09-29 MED ORDER — LIDOCAINE-EPINEPHRINE 1 %-1:100000 IJ SOLN
INTRAMUSCULAR | Status: AC | PRN
  Administered 2019-09-29: 10 mL

## 2019-09-29 MED ORDER — HEPARIN SOD (PORK) LOCK FLUSH 100 UNIT/ML IV SOLN
INTRAVENOUS | Status: AC
  Filled 2019-09-29: qty 5

## 2019-09-29 MED ORDER — SODIUM CHLORIDE 0.9 % IV SOLN: INTRAVENOUS | Status: DC

## 2019-09-29 MED ORDER — HEPARIN SOD (PORK) LOCK FLUSH 100 UNIT/ML IV SOLN
INTRAVENOUS | Status: AC | PRN
  Administered 2019-09-29: 500 [IU] via INTRAVENOUS

## 2019-09-29 MED ORDER — CEFAZOLIN SODIUM-DEXTROSE 2-4 GM/100ML-% IV SOLN
INTRAVENOUS | Status: AC
  Administered 2019-09-29: 2 g via INTRAVENOUS
  Filled 2019-09-29: qty 100

## 2019-09-29 NOTE — Discharge Instructions (Signed)
DO NOT APPLY ANY EMLA CREAM OR LOTIONS TO THE PORT SITE X 2 WEEKS  DO NOT SHOWER X 24 HRS   FOR ANY QUESTIONS OR CONCERNS CALL [phone]  Implanted Port Insertion, Care After This sheet gives you information about how to care for yourself after your procedure. Your health care provider may also give you more specific instructions. If you have problems or questions, contact your health care provider. What can I expect after the procedure? After the procedure, it is common to have:  Discomfort at the port insertion site.  Bruising on the skin over the port. This should improve over 3-4 days. Follow these instructions at home: Port care  After your port is placed, you will get a manufacturer's information card. The card has information about your port. Keep this card with you at all times.  Take care of the port as told by your health care provider. Ask your health care provider if you or a family member can get training for taking care of the port at home. A home health care nurse may also take care of the port.  Make sure to remember what type of port you have. Incision care      Follow instructions from your health care provider about how to take care of your port insertion site. Make sure you: ? Wash your hands with soap and water before and after you change your bandage (dressing). If soap and water are not available, use hand sanitizer. ? Change your dressing as told by your health care provider. ? Leave stitches (sutures), skin glue, or adhesive strips in place. These skin closures may need to stay in place for 2 weeks or longer. If adhesive strip edges start to loosen and curl up, you may trim the loose edges. Do not remove adhesive strips completely unless your health care provider tells you to do that.  Check your port insertion site every day for signs of infection. Check for: ? Redness, swelling, or pain. ? Fluid or blood. ? Warmth. ? Pus or a bad  smell. Activity  Return to your normal activities as told by your health care provider. Ask your health care provider what activities are safe for you.  Do not lift anything that is heavier than 10 lb (4.5 kg), or the limit that you are told, until your health care provider says that it is safe. General instructions  Take over-the-counter and prescription medicines only as told by your health care provider.  Do not take baths, swim, or use a hot tub until your health care provider approves. Ask your health care provider if you may take showers. You may only be allowed to take sponge baths.  Do not drive for 24 hours if you were given a sedative during your procedure.  Wear a medical alert bracelet in case of an emergency. This will tell any health care providers that you have a port.  Keep all follow-up visits as told by your health care provider. This is important. Contact a health care provider if:  You cannot flush your port with saline as directed, or you cannot draw blood from the port.  You have a fever or chills.  You have redness, swelling, or pain around your port insertion site.  You have fluid or blood coming from your port insertion site.  Your port insertion site feels warm to the touch.  You have pus or a bad smell coming from the port insertion site. Get help right away if:    You have chest pain or shortness of breath.  You have bleeding from your port that you cannot control. Summary  Take care of the port as told by your health care provider. Keep the manufacturer's information card with you at all times.  Change your dressing as told by your health care provider.  Contact a health care provider if you have a fever or chills or if you have redness, swelling, or pain around your port insertion site.  Keep all follow-up visits as told by your health care provider. This information is not intended to replace advice given to you by your health care provider.  Make sure you discuss any questions you have with your health care provider. Document Revised: 08/14/2017 Document Reviewed: 08/14/2017 Elsevier Patient Education  2020 Elsevier Inc. Moderate Conscious Sedation, Adult, Care After These instructions provide you with information about caring for yourself after your procedure. Your health care provider may also give you more specific instructions. Your treatment has been planned according to current medical practices, but problems sometimes occur. Call your health care provider if you have any problems or questions after your procedure. What can I expect after the procedure? After your procedure, it is common:  To feel sleepy for several hours.  To feel clumsy and have poor balance for several hours.  To have poor judgment for several hours.  To vomit if you eat too soon. Follow these instructions at home: For at least 24 hours after the procedure:   Do not: ? Participate in activities where you could fall or become injured. ? Drive. ? Use heavy machinery. ? Drink alcohol. ? Take sleeping pills or medicines that cause drowsiness. ? Make important decisions or sign legal documents. ? Take care of children on your own.  Rest. Eating and drinking  Follow the diet recommended by your health care provider.  If you vomit: ? Drink water, juice, or soup when you can drink without vomiting. ? Make sure you have little or no nausea before eating solid foods. General instructions  Have a responsible adult stay with you until you are awake and alert.  Take over-the-counter and prescription medicines only as told by your health care provider.  If you smoke, do not smoke without supervision.  Keep all follow-up visits as told by your health care provider. This is important. Contact a health care provider if:  You keep feeling nauseous or you keep vomiting.  You feel light-headed.  You develop a rash.  You have a fever. Get help  right away if:  You have trouble breathing. This information is not intended to replace advice given to you by your health care provider. Make sure you discuss any questions you have with your health care provider. Document Revised: 12/29/2016 Document Reviewed: 05/08/2015 Elsevier Patient Education  2020 Elsevier Inc.  

## 2019-09-29 NOTE — H&P (Signed)
Chief Complaint: Patient was seen in consultation today for port-a-catheter placement.  Referring Physician(s): Ladell Pier  Supervising Physician: Sandi Mariscal  Patient Status: [facility] - Out-pt  History of Present Illness: Samantha Lutz is a 69 y.o. female with a medical history significant for rectal cancer, first diagnosed July 2019. Her treatments have included radiation therapy, oral chemotherapy and low anterior resection/diverting ileostomy. She had an ileostomy take down June 2020. Imaging since that time has consistently displayed small pulmonary nodules concerning for metastatic disease. A PET scan on 09/09/19 showed further disease progression in the lungs with left liver metastasis.   Interventional Radiology has been asked to evaluate this patient for an image-guided port-a-catheter placement to facilitate her treatment plans.   Past Medical History:  Diagnosis Date  . Anemia    remote hx.  . Arthritis    Bi lat knee  . Cancer ([place]) 07/2017   Rectal Cancer   . Fatty liver    ultrasound December 2012  . Hypothyroid 01/16/2013 dx  . Irregular heartbeat    PER PATIENT REPORT; ONSET 20+YEARS AGO SAW CARDIOLOGY AT THE TIME C/O "ITS BEAT REALLY FAST FOR A MINUTE AND THEN STOPPED " DENIES ANY OTHER CARDIAC SX';  REPORTS CARDIO DID ECHO WHICH WAS NEGATIVE;  NOW COMES AND GOES ;   . Lichen sclerosus    steroids as needed  . Temporary low platelet count (HCC)    SEE LAST LABS IN EPIC    Past Surgical History:  Procedure Laterality Date  . APPENDECTOMY  1967  . [place]  . COLONOSCOPY  08/09/2017   danis - polyps  . ILEOSTOMY CLOSURE N/A 07/10/2018   Procedure: ILEOSTOMY LOOP TAKEDOWN WITH TAP BLOCK;  Surgeon: [name], MD;  Location: [facility] ORS;  Service: General;  Laterality: N/A;  . OSTOMY N/A 12/21/2017   Procedure: DIVERTING LOOP OSTOMY;  Surgeon: [name], MD;  Location: [facility] ORS;  Service: General;  Laterality: N/A;  . PROCTOSCOPY  N/A 12/21/2017   Procedure: RIGID PROCTOSCOPY;  Surgeon: [name], MD;  Location: [facility] ORS;  Service: General;  Laterality: N/A;  . XI ROBOTIC ASSISTED LOWER ANTERIOR RESECTION N/A 12/21/2017   Procedure: XI ROBOTIC ASSISTED LOWER ANTERIOR RESECTION;  Surgeon: [name], MD;  Location: [facility];  Service: General;  Laterality: N/A;    Allergies: Epinephrine and Losartan  Medications: Prior to Admission medications   Medication Sig Start Date End Date Taking? Authorizing Provider  levothyroxine (SYNTHROID) 50 MCG tablet Take 1 tablet (50 mcg total) by mouth daily. Annual appt due in Oct must see provider for future refills 05/26/19  Yes Marrian Salvage, FNP     Family History  Problem Relation Age of Onset  . Diabetes Mother   . Hypertension Mother   . COPD Mother   . Glaucoma Father 71  . Stroke Maternal Grandmother 48  . Hypertension Maternal Grandmother   . Rheum arthritis Sister 53  . Hypertension Daughter   . Breast cancer Cousin   . Colon cancer Neg Hx   . Esophageal cancer Neg Hx   . Liver cancer Neg Hx   . Pancreatic cancer Neg Hx   . Stomach cancer Neg Hx   . Rectal cancer Neg Hx     Social History   Socioeconomic History  . Marital status: Married    Spouse name: Not on file  . Number of children: Not on file  . Years of education: Not on file  .  Highest education level: Not on file  Occupational History  . Not on file  Tobacco Use  . Smoking status: Never Smoker  . Smokeless tobacco: Never Used  Vaping Use  . Vaping Use: Never used  Substance and Sexual Activity  . Alcohol use: No  . Drug use: No  . Sexual activity: Yes    Birth control/protection: Post-menopausal  Other Topics Concern  . Not on file  Social History Narrative  . Not on file   Social Determinants of Health   Financial Resource Strain:   . Difficulty of Paying Living Expenses: Not on file  Food Insecurity:   . Worried About Charity fundraiser in the Last Year: Not on  file  . Ran Out of Food in the Last Year: Not on file  Transportation Needs:   . Lack of Transportation (Medical): Not on file  . Lack of Transportation (Non-Medical): Not on file  Physical Activity:   . Days of Exercise per Week: Not on file  . Minutes of Exercise per Session: Not on file  Stress:   . Feeling of Stress : Not on file  Social Connections:   . Frequency of Communication with Friends and Family: Not on file  . Frequency of Social Gatherings with Friends and Family: Not on file  . Attends Religious Services: Not on file  . Active Member of Clubs or Organizations: Not on file  . Attends Archivist Meetings: Not on file  . Marital Status: Not on file    Review of Systems: A 12 point ROS discussed and pertinent positives are indicated in the HPI above.  All other systems are negative.  Review of Systems  Constitutional: Negative for appetite change and fatigue.  Respiratory: Negative for cough and shortness of breath.   Cardiovascular: Negative for chest pain and leg swelling.  Gastrointestinal: Negative for abdominal pain, diarrhea, nausea and vomiting.  Musculoskeletal: Negative for back pain.  Neurological: Negative for headaches.  Hematological: Does not bruise/bleed easily.    Vital Signs: BP (!) 180/79   Pulse 92   Temp 99.1 F (37.3 C) (Oral)   Resp 16   Ht 5\' 3"  (1.6 m)   Wt 204 lb (92.5 kg)   LMP  (LMP Unknown)   SpO2 99%   BMI 36.14 kg/m   Physical Exam Constitutional:      General: She is not in acute distress. HENT:     Mouth/Throat:     Mouth: Mucous membranes are moist.     Pharynx: Oropharynx is clear.  Cardiovascular:     Rate and Rhythm: Rhythm irregular.     Pulses: Normal pulses.     Heart sounds: Normal heart sounds.  Pulmonary:     Effort: Pulmonary effort is normal.     Breath sounds: Normal breath sounds.  Abdominal:     General: Bowel sounds are normal.     Palpations: Abdomen is soft.  Musculoskeletal:         General: Normal range of motion.  Skin:    General: Skin is warm and dry.  Neurological:     Mental Status: She is alert and oriented to person, place, and time.     Imaging: NM PET Image Restag (PS) Skull Base To Thigh  Result Date: 09/09/2019 CLINICAL DATA:  Initial treatment strategy for rectal cancer with pulmonary metastases status post SBRT to right lung completed 05/13/2019, with enlarging bilateral pulmonary nodules on most recent chest CT. EXAM: NUCLEAR MEDICINE PET SKULL  BASE TO THIGH TECHNIQUE: 11.1 mCi F-18 FDG was injected intravenously. Full-ring PET imaging was performed from the skull base to thigh after the radiotracer. CT data was obtained and used for attenuation correction and anatomic localization. Fasting blood glucose: 103 mg/dl COMPARISON:  08/27/2019 chest CT. FINDINGS: Mediastinal blood pool activity: SUV max 4.0 Liver activity: SUV max NA NECK: No hypermetabolic lymph nodes in the neck. Uniform hypermetabolism in the normal size thyroid gland without discrete thyroid nodules. Incidental CT findings: none CHEST: Hypermetabolic 1.1 cm medial superior segment right lower lobe pulmonary nodule with max SUV 7.7 (series 8/image 36). Two hypermetabolic left upper lobe pulmonary nodules, largest 1.3 cm centrally with max SUV 9.8 (series 8/image 28). Additional 0.7 cm posterior left upper lobe pulmonary nodule (series 8/image 32), below PET resolution. Patchy consolidation and ground-glass opacity throughout the mid to lower right lung with associated hypermetabolism, compatible with evolving postradiation change. No enlarged or hypermetabolic axillary, mediastinal or hilar lymph nodes. Incidental CT findings: Coronary atherosclerosis. Atherosclerotic nonaneurysmal thoracic aorta. ABDOMEN/PELVIS: Hypermetabolic central segment 4A left liver lesion with max SUV 12.8, poorly delineated on the noncontrast CT images. No additional hypermetabolic liver lesions. Postsurgical changes from low  anterior resection with colo-anal anastomosis. Generalized mild hypermetabolism in the distal large bowel without discrete mass on the CT images or focal uptake, favoring physiologic uptake. No abnormal hypermetabolic activity within the pancreas, adrenal glands, or spleen. No hypermetabolic lymph nodes in the abdomen or pelvis. Incidental CT findings: Cholelithiasis. Atherosclerotic nonaneurysmal abdominal aorta. Small hiatal hernia. SKELETON: No focal hypermetabolic activity to suggest skeletal metastasis. Incidental CT findings: none IMPRESSION: 1. Solitary hypermetabolic central segment 4A left liver metastasis, poorly delineated on the noncontrast CT images. 2. Hypermetabolic superior segment right lower lobe and left upper lobe pulmonary metastases. 3. Expected evolution of patchy postradiation changes in the mid to lower right lung. 4. Chronic findings include: Aortic Atherosclerosis (ICD10-I70.0). Cholelithiasis. Small hiatal hernia. Electronically Signed   By: Ilona Sorrel M.D.   On: 09/09/2019 15:14    Labs:  CBC: Recent Labs    11/01/18 1407 12/10/18 1208  WBC 4.9 5.2  HGB 9.7* 11.6*  HCT 28.9* 36.3  PLT 162.0 130*    COAGS: No results for input(s): INR, APTT in the last 8760 hours.  BMP: Recent Labs    11/01/18 1407  NA 142  K 4.1  CL 106  CO2 27  GLUCOSE 81  BUN 24*  CALCIUM 9.2  CREATININE 0.94    LIVER FUNCTION TESTS: Recent Labs    11/01/18 1407  BILITOT 0.4  AST 15  ALT 8  ALKPHOS 52  PROT 6.9  ALBUMIN 3.9    TUMOR MARKERS: No results for input(s): AFPTM, CEA, CA199, CHROMGRNA in the last 8760 hours.  Assessment and Plan:  Metastatic rectal cancer: Samantha Lutz, 69 year old female, presents today to the [facility] department for an image-guided port-a-catheter placement.   Risks and benefits of image-guided Port-a-catheter placement were discussed with the patient including, but not limited to bleeding, infection,  pneumothorax, or fibrin sheath development and need for additional procedures.  All of the patient's questions were answered, patient is agreeable to proceed.  She has been NPO. Vitals have been reviewed. She does not take any blood-thinning medications.   Consent signed and in chart.  Thank you for this interesting consult.  I greatly enjoyed meeting Samantha Lutz and look forward to participating in their care.  A copy of this report was sent to the requesting  provider on this date.  Electronically Signed: Soyla Dryer, AGACNP-BC [phone] 09/29/2019, 12:57 PM   I spent a total of  30 Minutes   in face to face in clinical consultation, greater than 50% of which was counseling/coordinating care for port-a-catheter placement.

## 2019-09-29 NOTE — Procedures (Signed)
Pre Procedure Dx: Poor venous access Post Procedural Dx: Same  Successful placement of right IJ approach port-a-cath with tip at the superior caval atrial junction. The catheter is ready for immediate use.  Estimated Blood Loss: Minimal  Complications: None immediate.  Jay Brandis Wixted, MD Pager #: [phone]   

## 2019-10-02 ENCOUNTER — Inpatient Hospital Stay: Payer: Medicare Other | Attending: Oncology | Admitting: Nurse Practitioner

## 2019-10-02 ENCOUNTER — Telehealth: Payer: Self-pay | Admitting: Nurse Practitioner

## 2019-10-02 ENCOUNTER — Other Ambulatory Visit: Payer: Self-pay

## 2019-10-02 VITALS — BP 122/68 | HR 88 | Temp 98.5°F | Resp 16 | Ht 63.0 in | Wt 202.1 lb

## 2019-10-02 DIAGNOSIS — Z79899 Other long term (current) drug therapy: Secondary | ICD-10-CM | POA: Diagnosis not present

## 2019-10-02 DIAGNOSIS — E039 Hypothyroidism, unspecified: Secondary | ICD-10-CM | POA: Insufficient documentation

## 2019-10-02 DIAGNOSIS — C20 Malignant neoplasm of rectum: Secondary | ICD-10-CM

## 2019-10-02 DIAGNOSIS — Z923 Personal history of irradiation: Secondary | ICD-10-CM | POA: Insufficient documentation

## 2019-10-02 DIAGNOSIS — Z7189 Other specified counseling: Secondary | ICD-10-CM | POA: Insufficient documentation

## 2019-10-02 DIAGNOSIS — Z452 Encounter for adjustment and management of vascular access device: Secondary | ICD-10-CM | POA: Diagnosis not present

## 2019-10-02 DIAGNOSIS — R0609 Other forms of dyspnea: Secondary | ICD-10-CM | POA: Diagnosis not present

## 2019-10-02 MED ORDER — PROCHLORPERAZINE MALEATE 10 MG PO TABS: 10.0000 mg | ORAL_TABLET | Freq: Four times a day (QID) | ORAL | 2 refills | Status: DC | PRN

## 2019-10-02 MED ORDER — LIDOCAINE-PRILOCAINE 2.5-2.5 % EX CREA: 1.0000 "application " | TOPICAL_CREAM | CUTANEOUS | 2 refills | Status: DC | PRN

## 2019-10-02 NOTE — Progress Notes (Signed)
START OFF PATHWAY REGIMEN - Colorectal   [id number] (Leucovorin IV D1 + Fluorouracil IV D1/CIV D1,2 + Oxaliplatin IV D1) q14 Days:   A cycle is every 14 days:     Oxaliplatin      Leucovorin      Fluorouracil      Fluorouracil   **Always confirm dose/schedule in your pharmacy ordering system**  Patient Characteristics: Distant Metastases, Nonsurgical Candidate, KRAS/NRAS Wild-Type (BRAF V600 Wild-Type/Unknown), Standard Cytotoxic Therapy, First Line Standard Cytotoxic Therapy, Left-sided Tumor, PS = 0,1 Tumor Location: Rectal Therapeutic Status: Distant Metastases Microsatellite/Mismatch Repair Status: MSS/pMMR BRAF Mutation Status: Wild-Type (no mutation) KRAS/NRAS Mutation Status: Wild-Type (no mutation) Preferred Therapy Approach: Standard Cytotoxic Therapy Standard Cytotoxic Line of Therapy: First Line Standard Cytotoxic Therapy ECOG Performance Status: 0 Primary Tumor Location: Left-sided Tumor Intent of Therapy: Non-Curative / Palliative Intent, Discussed with Patient 

## 2019-10-02 NOTE — Progress Notes (Addendum)
[place] OFFICE PROGRESS NOTE   Diagnosis: Rectal cancer  INTERVAL HISTORY:   Samantha Lutz returns as scheduled.  She feels well.  Bowels moving regularly.  She denies shortness of breath.  No fever or cough.  Objective:  Vital signs in last 24 hours:  Blood pressure 122/68, pulse 88, temperature 98.5 F (36.9 C), temperature source Tympanic, resp. rate 16, height '5\' 3"'  (1.6 m), weight 202 lb 1.6 oz (91.7 kg), SpO2 97 %.   GI: Abdomen soft and nontender.  No hepatomegaly. Vascular: No leg edema. Neuro: Alert and oriented. Port-A-Cath without erythema.  Lab Results:  Lab Results  Component Value Date   WBC 5.2 12/10/2018   HGB 11.6 (L) 12/10/2018   HCT 36.3 12/10/2018   MCV 88.1 12/10/2018   PLT 130 (L) 12/10/2018   NEUTROABS 4.0 12/10/2018    Imaging:  No results found.  Medications: I have reviewed the patient's current medications.  Assessment/Plan: 1. Rectal cancer ? Mass at 7 cm from the anal verge on colonoscopy 08/09/2017, biopsy revealed invasive adenocarcinoma ? Staging CTs 08/17/2017-no evidence of metastatic disease, asymmetric thickening in the mid rectum ? MR pelvis 09/01/2017, T3N0 lesion beginning at 6.3 cm from the anal sphincter ? Radiation/Xeloda initiated 09/17/2017, completed 10/25/2017 ? Low anterior resection/diverting ileostomy 12/21/2017,ypT3,ypN1atumor. Lymphovascular invasion present, intact mismatch repair protein expression, treatment effect present (TRS 1).  Mismatch repair protein IHC normal; Foundation 1-KRAS/NRAS wild-type, microsatellite status and tumor mutational burden could not be determined. ? Cycle 1 adjuvant Xeloda beginning 01/21/2018 ? Cycle 2 adjuvant Xeloda beginning 02/11/2018 ? Xeloda discontinued after cycle 2 secondary to patient preference ? Ileostomy takedown 07/10/2018 ? CTs 09/07/2018-multiple live small pulmonary nodules concerning for metastatic disease ? CT chest 12/10/2018-multiple bilateral lung  nodules, some have increased in size ? CT chest 04/08/2019-mild enlargement of bilateral lung nodules ? Status post SBRT multiple lung nodules 05/06/2019, 05/08/2019, 05/13/2019 ? CT chest 08/28/2019-improvement and resolution in majority of right-sided pulmonary nodules, a superior segment right lower lobe nodule has increased, no new right-sided nodules.  Progressive enlargement of left-sided pulmonary nodules ? PET scan 09/09/2019-hypermetabolic right lower lobe nodule, 2 hypermetabolic left upper lobe nodules with an additional 0.7 cm left upper lobe nodule below PET resolution, groundglass opacity in the mid to right lower lobe with associated hypermetabolism consistent with postradiation change, hypermetabolic central segment 4A liver lesion  2.Hypothyroid  3. 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  Port-A-Cath placement 09/29/2019, interventional radiology   Disposition: Ms. Lint appears stable.  Dr. Benay Spice recommends beginning systemic therapy on the FOLFOX regimen.  We reviewed potential toxicities associated with chemotherapy including bone marrow toxicity, nausea, hair loss, allergic reaction.  We discussed potential toxicities associated with oxaliplatin including cold sensitivity, peripheral neuropathy, acute laryngopharyngeal dysesthesia and more rare occurrences such as diplopia, incontinence, jaw pain, ataxia.  We reviewed potential toxicities associated with 5-fluorouracil including mouth sores, diarrhea, hand-foot syndrome, increased sensitivity to sun, skin rash.  She agrees to proceed.  She will attend a chemotherapy education class.  Prescriptions sent to her pharmacy for Compazine and EMLA cream.  Baseline labs will be obtained the day of the chemotherapy education class.  She will return for follow-up and cycle 1 FOLFOX on 10/14/2019.  She will contact the office in the interim with any problems.  Patient seen with Dr.  Benay Spice.      Ned Card ANP/GNP-BC   10/02/2019  2:08 PM  This was a shared visit with Ned Card.  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  We reviewed results of Foundation 1 testing with her.  We discussed systemic treatment options.  I recommend FOLFOX chemotherapy.  She will be eligible for Panitumumab based therapy in the future.  We reviewed potential toxicities associated with the FOLFOX regimen and she agrees to proceed.  She underwent Port-A-Cath placement earlier this week.  Julieanne Manson, MD

## 2019-10-02 NOTE — Telephone Encounter (Signed)
Scheduled per los. Gave avs and calendar  

## 2019-10-08 NOTE — Progress Notes (Signed)
Pharmacist Chemotherapy Monitoring - Initial Assessment    Anticipated start date: 10/14/19   Regimen:  . Are orders appropriate based on the patient's diagnosis, regimen, and cycle? Yes . Does the plan date match the patient's scheduled date? Yes . Is the sequencing of drugs appropriate? Yes . Are the premedications appropriate for the patient's regimen? Yes . Prior Authorization for treatment is: Pending o If applicable, is the correct biosimilar selected based on the patient's insurance? not applicable  Organ Function and Labs: Marland Kitchen Are dose adjustments needed based on the patient's renal function, hepatic function, or hematologic function? No . Are appropriate labs ordered prior to the start of patient's treatment? Yes . Other organ system assessment, if indicated: N/A . The following baseline labs, if indicated, have been ordered: N/A  Dose Assessment: . Are the drug doses appropriate? Yes . Are the following correct: o Drug concentrations Yes o IV fluid compatible with drug Yes o Administration routes Yes o Timing of therapy Yes . If applicable, does the patient have documented access for treatment and/or plans for port-a-cath placement? not applicable . If applicable, have lifetime cumulative doses been properly documented and assessed? not applicable Lifetime Dose Tracking  No doses have been documented on this patient for the following tracked chemicals: Doxorubicin, Epirubicin, Idarubicin, Daunorubicin, Mitoxantrone, Bleomycin, Oxaliplatin, Carboplatin, Liposomal Doxorubicin  o   Toxicity Monitoring/Prevention: . The patient has the following take home antiemetics prescribed: Prochlorperazine . The patient has the following take home medications prescribed: N/A . Medication allergies and previous infusion related reactions, if applicable, have been reviewed and addressed. Yes . The patient's current medication list has been assessed for drug-drug interactions with their  chemotherapy regimen. no significant drug-drug interactions were identified on review.  Order Review: . Are the treatment plan orders signed? No . Is the patient scheduled to see a provider prior to their treatment? Yes  I verify that I have reviewed each item in the above checklist and answered each question accordingly.  Michiko Lineman K 10/08/2019 4:21 PM

## 2019-10-12 ENCOUNTER — Other Ambulatory Visit: Payer: Self-pay | Admitting: Oncology

## 2019-10-13 ENCOUNTER — Inpatient Hospital Stay: Payer: Medicare Other

## 2019-10-13 ENCOUNTER — Encounter: Payer: Self-pay | Admitting: Oncology

## 2019-10-13 ENCOUNTER — Encounter: Payer: Self-pay | Admitting: Nurse Practitioner

## 2019-10-13 ENCOUNTER — Inpatient Hospital Stay (HOSPITAL_BASED_OUTPATIENT_CLINIC_OR_DEPARTMENT_OTHER): Payer: Medicare Other | Admitting: Nurse Practitioner

## 2019-10-13 ENCOUNTER — Other Ambulatory Visit: Payer: Self-pay

## 2019-10-13 VITALS — BP 154/80 | HR 80 | Temp 97.7°F | Resp 20 | Ht 63.0 in | Wt 206.4 lb

## 2019-10-13 DIAGNOSIS — C20 Malignant neoplasm of rectum: Secondary | ICD-10-CM | POA: Diagnosis not present

## 2019-10-13 DIAGNOSIS — Z95828 Presence of other vascular implants and grafts: Secondary | ICD-10-CM

## 2019-10-13 LAB — CBC WITH DIFFERENTIAL (CANCER CENTER ONLY)
Abs Immature Granulocytes: 0.02 10*3/uL (ref 0.00–0.07)
Basophils Absolute: 0 10*3/uL (ref 0.0–0.1)
Basophils Relative: 1 %
Eosinophils Absolute: 0.2 10*3/uL (ref 0.0–0.5)
Eosinophils Relative: 5 %
HCT: 35.7 % — ABNORMAL LOW (ref 36.0–46.0)
Hemoglobin: 12.3 g/dL (ref 12.0–15.0)
Immature Granulocytes: 0 %
Lymphocytes Relative: 11 %
Lymphs Abs: 0.5 10*3/uL — ABNORMAL LOW (ref 0.7–4.0)
MCH: 32.9 pg (ref 26.0–34.0)
MCHC: 34.5 g/dL (ref 30.0–36.0)
MCV: 95.5 fL (ref 80.0–100.0)
Monocytes Absolute: 0.7 10*3/uL (ref 0.1–1.0)
Monocytes Relative: 15 %
Neutro Abs: 3.2 10*3/uL (ref 1.7–7.7)
Neutrophils Relative %: 68 %
Platelet Count: 136 10*3/uL — ABNORMAL LOW (ref 150–400)
RBC: 3.74 MIL/uL — ABNORMAL LOW (ref 3.87–5.11)
RDW: 13.2 % (ref 11.5–15.5)
WBC Count: 4.7 10*3/uL (ref 4.0–10.5)
nRBC: 0 % (ref 0.0–0.2)

## 2019-10-13 LAB — CMP (CANCER CENTER ONLY)
ALT: 55 U/L — ABNORMAL HIGH (ref 0–44)
AST: 71 U/L — ABNORMAL HIGH (ref 15–41)
Albumin: 2.9 g/dL — ABNORMAL LOW (ref 3.5–5.0)
Alkaline Phosphatase: 235 U/L — ABNORMAL HIGH (ref 38–126)
Anion gap: 6 (ref 5–15)
BUN: 8 mg/dL (ref 8–23)
CO2: 26 mmol/L (ref 22–32)
Calcium: 8.9 mg/dL (ref 8.9–10.3)
Chloride: 108 mmol/L (ref 98–111)
Creatinine: 0.76 mg/dL (ref 0.44–1.00)
GFR, Est AFR Am: >60 mL/min (ref >60)
GFR, Estimated: >60 mL/min (ref >60)
Glucose, Bld: 104 mg/dL — ABNORMAL HIGH (ref 70–99)
Potassium: 3.6 mmol/L (ref 3.5–5.1)
Sodium: 140 mmol/L (ref 135–145)
Total Bilirubin: 1.3 mg/dL — ABNORMAL HIGH (ref 0.3–1.2)
Total Protein: 6.8 g/dL (ref 6.5–8.1)

## 2019-10-13 LAB — CEA (IN HOUSE-CHCC): CEA (CHCC-In House): 1.17 ng/mL (ref 0.00–5.00)

## 2019-10-13 MED ORDER — HEPARIN SOD (PORK) LOCK FLUSH 100 UNIT/ML IV SOLN
500.0000 [IU] | Freq: Once | INTRAVENOUS | Status: AC
  Administered 2019-10-13: 500 [IU] via INTRAVENOUS
  Filled 2019-10-13: qty 5

## 2019-10-13 MED ORDER — SODIUM CHLORIDE 0.9% FLUSH
10.0000 mL | INTRAVENOUS | Status: DC | PRN
  Administered 2019-10-13: 10 mL via INTRAVENOUS
  Filled 2019-10-13: qty 10

## 2019-10-13 NOTE — Progress Notes (Signed)
  [place] OFFICE PROGRESS NOTE   Diagnosis: Rectal cancer  INTERVAL HISTORY:   Ms. Andreas returns as scheduled.  She feels well.  No nausea or vomiting.  No diarrhea.  She has a good appetite.  She denies pre-existing neuropathy symptoms.  Objective:  Vital signs in last 24 hours:  Blood pressure (!) 154/80, pulse 80, temperature 97.7 F (36.5 C), temperature source Tympanic, resp. rate 20, height _0  (1.6 m), weight 206 lb 6.4 oz (93.6 kg), SpO2 97 %.    HEENT: No thrush or ulcers. Resp: Lungs clear bilaterally. Cardio: Regular rate and rhythm. Vascular: No leg edema. Neuro: Vibratory sense intact over the fingertips per tuning fork exam. Skin: Palms without erythema. Port-A-Cath without erythema.   Lab Results:  Lab Results  Component Value Date   WBC 4.7 10/13/2019   HGB 12.3 10/13/2019   HCT 35.7 (L) 10/13/2019   MCV 95.5 10/13/2019   PLT 136 (L) 10/13/2019   NEUTROABS 3.2 10/13/2019    Imaging:  No results found.  Medications: I have reviewed the patient's current medications.  Assessment/Plan: 1. Rectal cancer ? Mass at 7 cm from the anal verge on colonoscopy 08/09/2017, biopsy revealed invasive adenocarcinoma ? Staging CTs 08/17/2017-no evidence of metastatic disease, asymmetric thickening in the mid rectum ? MR pelvis 09/01/2017, T3N0 lesion beginning at 6.3 cm from the anal sphincter ? Radiation/Xeloda initiated 09/17/2017, completed 10/25/2017 ? Low anterior resection/diverting ileostomy 12/21/2017,ypT3,ypN1atumor. Lymphovascular invasion present, intact mismatch repair protein expression, treatment effect present (TRS 1).  Mismatch repair protein IHC normal; Foundation 1-KRAS/NRAS wild-type, microsatellite status and tumor mutational burden could not be determined. ? Cycle 1 adjuvant Xeloda beginning 01/21/2018 ? Cycle 2 adjuvant Xeloda beginning 02/11/2018 ? Xeloda discontinued after cycle 2 secondary to patient  preference ? Ileostomy takedown 07/10/2018 ? CTs 09/07/2018-multiple live small pulmonary nodules concerning for metastatic disease ? CT chest 12/10/2018-multiple bilateral lung nodules, some have increased in size ? CT chest 04/08/2019-mild enlargement of bilateral lung nodules ? Status post SBRT multiple lung nodules 05/06/2019, 05/08/2019, 05/13/2019 ? CT chest 08/28/2019-improvement and resolution in majority of right-sided pulmonary nodules, a superior segment right lower lobe nodule has increased, no new right-sided nodules. Progressive enlargement of left-sided pulmonary nodules ? PET scan 09/09/2019-hypermetabolic right lower lobe nodule, 2 hypermetabolic left upper lobe nodules with an additional 0.7 cm left upper lobe nodule below PET resolution, groundglass opacity in the mid to right lower lobe with associated hypermetabolism consistent with postradiation change, hypermetabolic central [id number] lesion ? Cycle 1 FOLFOX 10/14/2019  2.Hypothyroid  3. 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  Port-A-Cath placement 09/29/2019, interventional radiology   Disposition: Ms. Birdsall appears stable.  She is scheduled for cycle 1 FOLFOX tomorrow.  We again reviewed potential toxicities.  She agrees to proceed.  We reviewed the CBC from today.  Counts adequate to proceed as above.  She has mild thrombocytopenia which is stable.  Cycle 1 FOLFOX 10/14/2019.  Lab, follow-up, cycle 2 FOLFOX 10/28/2019.  We will contact the office prior to her next visit with any problems.    Ned Card ANP/GNP-BC   10/13/2019  2:53 PM

## 2019-10-13 NOTE — Patient Instructions (Signed)

## 2019-10-13 NOTE — Progress Notes (Signed)
Met with patient at registration to introduce myself as Financial Resource Specialist and to offer available resources.  Discussed one-time $1000 [employer] grant and qualifications to assist with personal expenses while going through treatment.  Gave her my card if interested in applying and for any additional financial questions or concerns.  

## 2019-10-14 ENCOUNTER — Emergency Department (HOSPITAL_COMMUNITY)
Admission: EM | Admit: 2019-10-14 | Discharge: 2019-10-15 | Disposition: A | Discharge status: Home / Self Care | Payer: Medicare Other | Attending: Emergency Medicine | Admitting: Emergency Medicine

## 2019-10-14 ENCOUNTER — Encounter (HOSPITAL_COMMUNITY): Payer: Self-pay | Admitting: Emergency Medicine

## 2019-10-14 ENCOUNTER — Emergency Department (HOSPITAL_COMMUNITY): Payer: Medicare Other

## 2019-10-14 ENCOUNTER — Telehealth: Payer: Self-pay | Admitting: Nurse Practitioner

## 2019-10-14 ENCOUNTER — Other Ambulatory Visit: Payer: Self-pay

## 2019-10-14 ENCOUNTER — Inpatient Hospital Stay: Payer: Medicare Other

## 2019-10-14 VITALS — BP 158/94 | HR 88 | Temp 98.0°F | Resp 18

## 2019-10-14 DIAGNOSIS — Z5321 Procedure and treatment not carried out due to patient leaving prior to being seen by health care provider: Secondary | ICD-10-CM | POA: Insufficient documentation

## 2019-10-14 DIAGNOSIS — R0602 Shortness of breath: Secondary | ICD-10-CM | POA: Insufficient documentation

## 2019-10-14 DIAGNOSIS — C20 Malignant neoplasm of rectum: Secondary | ICD-10-CM

## 2019-10-14 LAB — BASIC METABOLIC PANEL
Anion gap: 13 (ref 5–15)
BUN: 14 mg/dL (ref 8–23)
CO2: 21 mmol/L — ABNORMAL LOW (ref 22–32)
Calcium: 9.3 mg/dL (ref 8.9–10.3)
Chloride: 106 mmol/L (ref 98–111)
Creatinine, Ser: 0.74 mg/dL (ref 0.44–1.00)
GFR calc Af Amer: >60 mL/min (ref >60)
GFR calc non Af Amer: >60 mL/min (ref >60)
Glucose, Bld: 158 mg/dL — ABNORMAL HIGH (ref 70–99)
Potassium: 4 mmol/L (ref 3.5–5.1)
Sodium: 140 mmol/L (ref 135–145)

## 2019-10-14 LAB — CBC WITH DIFFERENTIAL/PLATELET
Abs Immature Granulocytes: 0.04 10*3/uL (ref 0.00–0.07)
Basophils Absolute: 0 10*3/uL (ref 0.0–0.1)
Basophils Relative: 0 %
Eosinophils Absolute: 0 10*3/uL (ref 0.0–0.5)
Eosinophils Relative: 0 %
HCT: 38.4 % (ref 36.0–46.0)
Hemoglobin: 13.3 g/dL (ref 12.0–15.0)
Immature Granulocytes: 1 %
Lymphocytes Relative: 6 %
Lymphs Abs: 0.3 10*3/uL — ABNORMAL LOW (ref 0.7–4.0)
MCH: 33 pg (ref 26.0–34.0)
MCHC: 34.6 g/dL (ref 30.0–36.0)
MCV: 95.3 fL (ref 80.0–100.0)
Monocytes Absolute: 0.1 10*3/uL (ref 0.1–1.0)
Monocytes Relative: 2 %
Neutro Abs: 4.9 10*3/uL (ref 1.7–7.7)
Neutrophils Relative %: 91 %
Platelets: 131 10*3/uL — ABNORMAL LOW (ref 150–400)
RBC: 4.03 MIL/uL (ref 3.87–5.11)
RDW: 13.2 % (ref 11.5–15.5)
WBC: 5.4 10*3/uL (ref 4.0–10.5)
nRBC: 0 % (ref 0.0–0.2)

## 2019-10-14 MED ORDER — OXALIPLATIN CHEMO INJECTION 100 MG/20ML
85.0000 mg/m2 | Freq: Once | INTRAVENOUS | Status: AC
  Administered 2019-10-14: 170 mg via INTRAVENOUS
  Filled 2019-10-14: qty 34

## 2019-10-14 MED ORDER — PALONOSETRON HCL INJECTION 0.25 MG/5ML
0.2500 mg | Freq: Once | INTRAVENOUS | Status: AC
  Administered 2019-10-14: 0.25 mg via INTRAVENOUS

## 2019-10-14 MED ORDER — SODIUM CHLORIDE 0.9 % IV SOLN
2475.0000 mg/m2 | INTRAVENOUS | Status: DC
  Administered 2019-10-14: 5000 mg via INTRAVENOUS
  Filled 2019-10-14: qty 100

## 2019-10-14 MED ORDER — SODIUM CHLORIDE 0.9 % IV SOLN
10.0000 mg | Freq: Once | INTRAVENOUS | Status: AC
  Administered 2019-10-14: 10 mg via INTRAVENOUS
  Filled 2019-10-14: qty 10

## 2019-10-14 MED ORDER — DEXTROSE 5 % IV SOLN
Freq: Once | INTRAVENOUS | Status: AC
  Filled 2019-10-14: qty 250

## 2019-10-14 MED ORDER — FLUOROURACIL CHEMO INJECTION 2.5 GM/50ML
400.0000 mg/m2 | Freq: Once | INTRAVENOUS | Status: AC
  Administered 2019-10-14: 800 mg via INTRAVENOUS
  Filled 2019-10-14: qty 16

## 2019-10-14 MED ORDER — LEUCOVORIN CALCIUM INJECTION 350 MG
400.0000 mg/m2 | Freq: Once | INTRAVENOUS | Status: AC
  Administered 2019-10-14: 808 mg via INTRAVENOUS
  Filled 2019-10-14: qty 40.4

## 2019-10-14 MED ORDER — PALONOSETRON HCL INJECTION 0.25 MG/5ML
INTRAVENOUS | Status: AC
  Filled 2019-10-14: qty 5

## 2019-10-14 NOTE — ED Triage Notes (Signed)
Per pt, states she got a new chemo med she started today-states after taking med she her throat felt a "little weird"...states she laid down to take a nap and when she woke she had a coughing spell which led to some SOB-states it only lasted a few minutes-states because of symptoms and list of side effects she was told to come to ED

## 2019-10-14 NOTE — Telephone Encounter (Signed)
Scheduled appointments per 9/13 los. Left message for patient with appointment details.

## 2019-10-14 NOTE — Patient Instructions (Signed)
Samantha Lutz Discharge Instructions for Patients Receiving Chemotherapy  Today you received the following chemotherapy agents Oxaliplatin, Leucovorin and Adrucil   To help prevent nausea and vomiting after your treatment, we encourage you to take your nausea medication as directed.    If you develop nausea and vomiting that is not controlled by your nausea medication, call the clinic.   BELOW ARE SYMPTOMS THAT SHOULD BE REPORTED IMMEDIATELY:  *FEVER GREATER THAN 100.5 F  *CHILLS WITH OR WITHOUT FEVER  NAUSEA AND VOMITING THAT IS NOT CONTROLLED WITH YOUR NAUSEA MEDICATION  *UNUSUAL SHORTNESS OF BREATH  *UNUSUAL BRUISING OR BLEEDING  TENDERNESS IN MOUTH AND THROAT WITH OR WITHOUT PRESENCE OF ULCERS  *URINARY PROBLEMS  *BOWEL PROBLEMS  UNUSUAL RASH Items with * indicate a potential emergency and should be followed up as soon as possible.  Feel free to call the clinic should you have any questions or concerns. The clinic phone number is [phone].  Please show the [place] at check-in to the Emergency Department and triage nurse.  Oxaliplatin Injection What is this medicine? OXALIPLATIN (ox AL i PLA tin) is a chemotherapy drug. It targets fast dividing cells, like cancer cells, and causes these cells to die. This medicine is used to treat cancers of the colon and rectum, and many other cancers. This medicine may be used for other purposes; ask your health care provider or pharmacist if you have questions. COMMON BRAND NAME(S): Eloxatin What should I tell my health care provider before I take this medicine? They need to know if you have any of these conditions:  heart disease  history of irregular heartbeat  liver disease  low blood counts, like white cells, platelets, or red blood cells  lung or breathing disease, like asthma  take medicines that treat or prevent blood clots  tingling of the fingers or toes, or other nerve disorder  an  unusual or allergic reaction to oxaliplatin, other chemotherapy, other medicines, foods, dyes, or preservatives  pregnant or trying to get pregnant  breast-feeding How should I use this medicine? This drug is given as an infusion into a vein. It is administered in a hospital or clinic by a specially trained health care professional. Talk to your pediatrician regarding the use of this medicine in children. Special care may be needed. Overdosage: If you think you have taken too much of this medicine contact a poison control center or emergency room at once. NOTE: This medicine is only for you. Do not share this medicine with others. What if I miss a dose? It is important not to miss a dose. Call your doctor or health care professional if you are unable to keep an appointment. What may interact with this medicine? Do not take this medicine with any of the following medications:  cisapride  dronedarone  pimozide  thioridazine This medicine may also interact with the following medications:  aspirin and aspirin-like medicines  certain medicines that treat or prevent blood clots like warfarin, apixaban, dabigatran, and rivaroxaban  cisplatin  cyclosporine  diuretics  medicines for infection like acyclovir, adefovir, amphotericin B, bacitracin, cidofovir, foscarnet, ganciclovir, gentamicin, pentamidine, vancomycin  NSAIDs, medicines for pain and inflammation, like ibuprofen or naproxen  other medicines that prolong the QT interval (an abnormal heart rhythm)  pamidronate  zoledronic acid This list may not describe all possible interactions. Give your health care provider a list of all the medicines, herbs, non-prescription drugs, or dietary supplements you use. Also tell them if you  smoke, drink alcohol, or use illegal drugs. Some items may interact with your medicine. What should I watch for while using this medicine? Your condition will be monitored carefully while you are  receiving this medicine. You may need blood work done while you are taking this medicine. This medicine may make you feel generally unwell. This is not uncommon as chemotherapy can affect healthy cells as well as cancer cells. Report any side effects. Continue your course of treatment even though you feel ill unless your healthcare professional tells you to stop. This medicine can make you more sensitive to cold. Do not drink cold drinks or use ice. Cover exposed skin before coming in contact with cold temperatures or cold objects. When out in cold weather wear warm clothing and cover your mouth and nose to warm the air that goes into your lungs. Tell your doctor if you get sensitive to the cold. Do not become pregnant while taking this medicine or for 9 months after stopping it. Women should inform their health care professional if they wish to become pregnant or think they might be pregnant. Men should not father a child while taking this medicine and for 6 months after stopping it. There is potential for serious side effects to an unborn child. Talk to your health care professional for more information. Do not breast-feed a child while taking this medicine or for 3 months after stopping it. This medicine has caused ovarian failure in some women. This medicine may make it more difficult to get pregnant. Talk to your health care professional if you are concerned about your fertility. This medicine has caused decreased sperm counts in some men. This may make it more difficult to father a child. Talk to your health care professional if you are concerned about your fertility. This medicine may increase your risk of getting an infection. Call your health care professional for advice if you get a fever, chills, or sore throat, or other symptoms of a cold or flu. Do not treat yourself. Try to avoid being around people who are sick. Avoid taking medicines that contain aspirin, acetaminophen, ibuprofen, naproxen,  or ketoprofen unless instructed by your health care professional. These medicines may hide a fever. Be careful brushing or flossing your teeth or using a toothpick because you may get an infection or bleed more easily. If you have any dental work done, tell your dentist you are receiving this medicine. What side effects may I notice from receiving this medicine? Side effects that you should report to your doctor or health care professional as soon as possible:  allergic reactions like skin rash, itching or hives, swelling of the face, lips, or tongue  breathing problems  cough  low blood counts - this medicine may decrease the number of white blood cells, red blood cells, and platelets. You may be at increased risk for infections and bleeding  nausea, vomiting  pain, redness, or irritation at site where injected  pain, tingling, numbness in the hands or feet  signs and symptoms of bleeding such as bloody or black, tarry stools; red or dark brown urine; spitting up blood or brown material that looks like coffee grounds; red spots on the skin; unusual bruising or bleeding from the eyes, gums, or nose  signs and symptoms of a dangerous change in heartbeat or heart rhythm like chest pain; dizziness; fast, irregular heartbeat; palpitations; feeling faint or lightheaded; falls  signs and symptoms of infection like fever; chills; cough; sore throat; pain or trouble  passing urine  signs and symptoms of liver injury like dark yellow or brown urine; general ill feeling or flu-like symptoms; light-colored stools; loss of appetite; nausea; right upper belly pain; unusually weak or tired; yellowing of the eyes or skin  signs and symptoms of low red blood cells or anemia such as unusually weak or tired; feeling faint or lightheaded; falls  signs and symptoms of muscle injury like dark urine; trouble passing urine or change in the amount of urine; unusually weak or tired; muscle pain; back pain Side  effects that usually do not require medical attention (report to your doctor or health care professional if they continue or are bothersome):  changes in taste  diarrhea  gas  hair loss  loss of appetite  mouth sores This list may not describe all possible side effects. Call your doctor for medical advice about side effects. You may report side effects to FDA at 1-800-FDA-1088. Where should I keep my medicine? This drug is given in a hospital or clinic and will not be stored at home. NOTE: This sheet is a summary. It may not cover all possible information. If you have questions about this medicine, talk to your doctor, pharmacist, or health care provider.  2020 Elsevier/Gold Standard (2018-06-05 12:20:35)  Leucovorin injection What is this medicine? LEUCOVORIN (loo koe VOR in) is used to prevent or treat the harmful effects of some medicines. This medicine is used to treat anemia caused by a low amount of folic acid in the body. It is also used with 5-fluorouracil (5-FU) to treat colon cancer. This medicine may be used for other purposes; ask your health care provider or pharmacist if you have questions. What should I tell my health care provider before I take this medicine? They need to know if you have any of these conditions:  anemia from low levels of vitamin B-12 in the blood  an unusual or allergic reaction to leucovorin, folic acid, other medicines, foods, dyes, or preservatives  pregnant or trying to get pregnant  breast-feeding How should I use this medicine? This medicine is for injection into a muscle or into a vein. It is given by a health care professional in a hospital or clinic setting. Talk to your pediatrician regarding the use of this medicine in children. Special care may be needed. Overdosage: If you think you have taken too much of this medicine contact a poison control center or emergency room at once. NOTE: This medicine is only for you. Do not share this  medicine with others. What if I miss a dose? This does not apply. What may interact with this medicine?  capecitabine  fluorouracil  phenobarbital  phenytoin  primidone  trimethoprim-sulfamethoxazole This list may not describe all possible interactions. Give your health care provider a list of all the medicines, herbs, non-prescription drugs, or dietary supplements you use. Also tell them if you smoke, drink alcohol, or use illegal drugs. Some items may interact with your medicine. What should I watch for while using this medicine? Your condition will be monitored carefully while you are receiving this medicine. This medicine may increase the side effects of 5-fluorouracil, 5-FU. Tell your doctor or health care professional if you have diarrhea or mouth sores that do not get better or that get worse. What side effects may I notice from receiving this medicine? Side effects that you should report to your doctor or health care professional as soon as possible:  allergic reactions like skin rash, itching or hives, swelling  of the face, lips, or tongue  breathing problems  fever, infection  mouth sores  unusual bleeding or bruising  unusually weak or tired Side effects that usually do not require medical attention (report to your doctor or health care professional if they continue or are bothersome):  constipation or diarrhea  loss of appetite  nausea, vomiting This list may not describe all possible side effects. Call your doctor for medical advice about side effects. You may report side effects to FDA at 1-800-FDA-1088. Where should I keep my medicine? This drug is given in a hospital or clinic and will not be stored at home. NOTE: This sheet is a summary. It may not cover all possible information. If you have questions about this medicine, talk to your doctor, pharmacist, or health care provider.  2020 Elsevier/Gold Standard (2007-07-23 16:50:29)  Fluorouracil, 5-FU  injection What is this medicine? FLUOROURACIL, 5-FU (flure oh YOOR a sil) is a chemotherapy drug. It slows the growth of cancer cells. This medicine is used to treat many types of cancer like breast cancer, colon or rectal cancer, pancreatic cancer, and stomach cancer. This medicine may be used for other purposes; ask your health care provider or pharmacist if you have questions. COMMON BRAND NAME(S): Adrucil What should I tell my health care provider before I take this medicine? They need to know if you have any of these conditions:  blood disorders  dihydropyrimidine dehydrogenase (DPD) deficiency  infection (especially a virus infection such as chickenpox, cold sores, or herpes)  kidney disease  liver disease  malnourished, poor nutrition  recent or ongoing radiation therapy  an unusual or allergic reaction to fluorouracil, other chemotherapy, other medicines, foods, dyes, or preservatives  pregnant or trying to get pregnant  breast-feeding How should I use this medicine? This drug is given as an infusion or injection into a vein. It is administered in a hospital or clinic by a specially trained health care professional. Talk to your pediatrician regarding the use of this medicine in children. Special care may be needed. Overdosage: If you think you have taken too much of this medicine contact a poison control center or emergency room at once. NOTE: This medicine is only for you. Do not share this medicine with others. What if I miss a dose? It is important not to miss your dose. Call your doctor or health care professional if you are unable to keep an appointment. What may interact with this medicine?  allopurinol  cimetidine  dapsone  digoxin  hydroxyurea  leucovorin  levamisole  medicines for seizures like ethotoin, fosphenytoin, phenytoin  medicines to increase blood counts like filgrastim, pegfilgrastim, sargramostim  medicines that treat or prevent blood  clots like warfarin, enoxaparin, and dalteparin  methotrexate  metronidazole  pyrimethamine  some other chemotherapy drugs like busulfan, cisplatin, estramustine, vinblastine  trimethoprim  trimetrexate  vaccines Talk to your doctor or health care professional before taking any of these medicines:  acetaminophen  aspirin  ibuprofen  ketoprofen  naproxen This list may not describe all possible interactions. Give your health care provider a list of all the medicines, herbs, non-prescription drugs, or dietary supplements you use. Also tell them if you smoke, drink alcohol, or use illegal drugs. Some items may interact with your medicine. What should I watch for while using this medicine? Visit your doctor for checks on your progress. This drug may make you feel generally unwell. This is not uncommon, as chemotherapy can affect healthy cells as well as cancer cells.  Report any side effects. Continue your course of treatment even though you feel ill unless your doctor tells you to stop. In some cases, you may be given additional medicines to help with side effects. Follow all directions for their use. Call your doctor or health care professional for advice if you get a fever, chills or sore throat, or other symptoms of a cold or flu. Do not treat yourself. This drug decreases your body's ability to fight infections. Try to avoid being around people who are sick. This medicine may increase your risk to bruise or bleed. Call your doctor or health care professional if you notice any unusual bleeding. Be careful brushing and flossing your teeth or using a toothpick because you may get an infection or bleed more easily. If you have any dental work done, tell your dentist you are receiving this medicine. Avoid taking products that contain aspirin, acetaminophen, ibuprofen, naproxen, or ketoprofen unless instructed by your doctor. These medicines may hide a fever. Do not become pregnant while  taking this medicine. Women should inform their doctor if they wish to become pregnant or think they might be pregnant. There is a potential for serious side effects to an unborn child. Talk to your health care professional or pharmacist for more information. Do not breast-feed an infant while taking this medicine. Men should inform their doctor if they wish to father a child. This medicine may lower sperm counts. Do not treat diarrhea with over the counter products. Contact your doctor if you have diarrhea that lasts more than 2 days or if it is severe and watery. This medicine can make you more sensitive to the sun. Keep out of the sun. If you cannot avoid being in the sun, wear protective clothing and use sunscreen. Do not use sun lamps or tanning beds/booths. What side effects may I notice from receiving this medicine? Side effects that you should report to your doctor or health care professional as soon as possible:  allergic reactions like skin rash, itching or hives, swelling of the face, lips, or tongue  low blood counts - this medicine may decrease the number of white blood cells, red blood cells and platelets. You may be at increased risk for infections and bleeding.  signs of infection - fever or chills, cough, sore throat, pain or difficulty passing urine  signs of decreased platelets or bleeding - bruising, pinpoint red spots on the skin, black, tarry stools, blood in the urine  signs of decreased red blood cells - unusually weak or tired, fainting spells, lightheadedness  breathing problems  changes in vision  chest pain  mouth sores  nausea and vomiting  pain, swelling, redness at site where injected  pain, tingling, numbness in the hands or feet  redness, swelling, or sores on hands or feet  stomach pain  unusual bleeding Side effects that usually do not require medical attention (report to your doctor or health care professional if they continue or are  bothersome):  changes in finger or toe nails  diarrhea  dry or itchy skin  hair loss  headache  loss of appetite  sensitivity of eyes to the light  stomach upset  unusually teary eyes This list may not describe all possible side effects. Call your doctor for medical advice about side effects. You may report side effects to FDA at 1-800-FDA-1088. Where should I keep my medicine? This drug is given in a hospital or clinic and will not be stored at home. NOTE: This sheet  is a summary. It may not cover all possible information. If you have questions about this medicine, talk to your doctor, pharmacist, or health care provider. °© 2020 Elsevier/Gold Standard (2007-05-22 13:53:16) ° °

## 2019-10-15 ENCOUNTER — Telehealth: Payer: Self-pay | Admitting: *Deleted

## 2019-10-15 ENCOUNTER — Emergency Department (HOSPITAL_COMMUNITY): Payer: Medicare Other

## 2019-10-15 NOTE — Telephone Encounter (Signed)
Called to f/u on status since 1st FOLFOX yesterday and call to nurse line last night and ER visit. She reports she had been cleaning toilet that is next to air vent and cold air was hitting her--then had coughing spell and felt SOB. She went to ER and sat for an extended period and left due to symptoms resolving. Today she is feeling well- no respiratory symptoms or nausea. Informed her that the cold air most likely caused this. In future, remember to avoid all cold and if this happens put warm back around her throat and cover mouth with hands and breathe warm air. She understands and agrees and appreciated the call.

## 2019-10-16 ENCOUNTER — Other Ambulatory Visit: Payer: Self-pay

## 2019-10-16 ENCOUNTER — Inpatient Hospital Stay: Payer: Medicare Other

## 2019-10-16 VITALS — BP 141/68 | HR 80 | Temp 98.6°F | Resp 18

## 2019-10-16 DIAGNOSIS — C20 Malignant neoplasm of rectum: Secondary | ICD-10-CM

## 2019-10-16 MED ORDER — HEPARIN SOD (PORK) LOCK FLUSH 100 UNIT/ML IV SOLN
500.0000 [IU] | Freq: Once | INTRAVENOUS | Status: AC | PRN
  Administered 2019-10-16: 500 [IU]
  Filled 2019-10-16: qty 5

## 2019-10-16 MED ORDER — SODIUM CHLORIDE 0.9% FLUSH
10.0000 mL | INTRAVENOUS | Status: DC | PRN
  Administered 2019-10-16: 10 mL
  Filled 2019-10-16: qty 10

## 2019-10-27 ENCOUNTER — Other Ambulatory Visit: Payer: Self-pay | Admitting: Oncology

## 2019-10-28 ENCOUNTER — Inpatient Hospital Stay: Payer: Medicare Other

## 2019-10-28 ENCOUNTER — Inpatient Hospital Stay (HOSPITAL_BASED_OUTPATIENT_CLINIC_OR_DEPARTMENT_OTHER): Payer: Medicare Other | Admitting: Nurse Practitioner

## 2019-10-28 ENCOUNTER — Encounter: Payer: Self-pay | Admitting: Nurse Practitioner

## 2019-10-28 ENCOUNTER — Telehealth: Payer: Self-pay | Admitting: Oncology

## 2019-10-28 ENCOUNTER — Other Ambulatory Visit: Payer: Self-pay

## 2019-10-28 VITALS — BP 144/67 | HR 89 | Temp 97.8°F | Resp 17 | Ht 63.0 in | Wt 201.0 lb

## 2019-10-28 DIAGNOSIS — C20 Malignant neoplasm of rectum: Secondary | ICD-10-CM

## 2019-10-28 LAB — CBC WITH DIFFERENTIAL (CANCER CENTER ONLY)
Abs Immature Granulocytes: 0 10*3/uL (ref 0.00–0.07)
Basophils Absolute: 0 10*3/uL (ref 0.0–0.1)
Basophils Relative: 1 %
Eosinophils Absolute: 0 10*3/uL (ref 0.0–0.5)
Eosinophils Relative: 4 %
HCT: 30.3 % — ABNORMAL LOW (ref 36.0–46.0)
Hemoglobin: 10.7 g/dL — ABNORMAL LOW (ref 12.0–15.0)
Immature Granulocytes: 0 %
Lymphocytes Relative: 38 %
Lymphs Abs: 0.4 10*3/uL — ABNORMAL LOW (ref 0.7–4.0)
MCH: 33.1 pg (ref 26.0–34.0)
MCHC: 35.3 g/dL (ref 30.0–36.0)
MCV: 93.8 fL (ref 80.0–100.0)
Monocytes Absolute: 0.2 10*3/uL (ref 0.1–1.0)
Monocytes Relative: 23 %
Neutro Abs: 0.4 10*3/uL — CL (ref 1.7–7.7)
Neutrophils Relative %: 34 %
Platelet Count: 81 10*3/uL — ABNORMAL LOW (ref 150–400)
RBC: 3.23 MIL/uL — ABNORMAL LOW (ref 3.87–5.11)
RDW: 13.4 % (ref 11.5–15.5)
WBC Count: 1.1 10*3/uL — ABNORMAL LOW (ref 4.0–10.5)
nRBC: 0 % (ref 0.0–0.2)

## 2019-10-28 LAB — CMP (CANCER CENTER ONLY)
ALT: 88 U/L — ABNORMAL HIGH (ref 0–44)
AST: 93 U/L — ABNORMAL HIGH (ref 15–41)
Albumin: 2.8 g/dL — ABNORMAL LOW (ref 3.5–5.0)
Alkaline Phosphatase: 195 U/L — ABNORMAL HIGH (ref 38–126)
Anion gap: 5 (ref 5–15)
BUN: 14 mg/dL (ref 8–23)
CO2: 27 mmol/L (ref 22–32)
Calcium: 9 mg/dL (ref 8.9–10.3)
Chloride: 108 mmol/L (ref 98–111)
Creatinine: 0.79 mg/dL (ref 0.44–1.00)
GFR, Est AFR Am: >60 mL/min (ref >60)
GFR, Estimated: >60 mL/min (ref >60)
Glucose, Bld: 128 mg/dL — ABNORMAL HIGH (ref 70–99)
Potassium: 3 mmol/L — CL (ref 3.5–5.1)
Sodium: 140 mmol/L (ref 135–145)
Total Bilirubin: 1.5 mg/dL — ABNORMAL HIGH (ref 0.3–1.2)
Total Protein: 6.3 g/dL — ABNORMAL LOW (ref 6.5–8.1)

## 2019-10-28 LAB — MAGNESIUM: Magnesium: 1.7 mg/dL (ref 1.7–2.4)

## 2019-10-28 MED ORDER — POTASSIUM CHLORIDE CRYS ER 20 MEQ PO TBCR: 20.0000 meq | EXTENDED_RELEASE_TABLET | Freq: Every day | ORAL | 2 refills | Status: DC

## 2019-10-28 MED ORDER — CIPROFLOXACIN HCL 500 MG PO TABS: 500.0000 mg | ORAL_TABLET | Freq: Two times a day (BID) | ORAL | 0 refills | Status: AC

## 2019-10-28 NOTE — Progress Notes (Addendum)
Samantha Lutz   Diagnosis: Rectal cancer  INTERVAL HISTORY:   Samantha Lutz returns as scheduled.  She completed cycle 1 FOLFOX 10/14/2019.  She denies nausea/vomiting.  She did not develop mouth sores but did Lutz mouth/gum soreness for 3 to 4 days after treatment.  No diarrhea.  She has mild persistent cold sensitivity.  Stable dyspnea on exertion.  Periodic coughing when she takes a deep breath.  No fever.  Objective:  Vital signs in last 24 hours:  Blood pressure (!) 144/67, pulse 89, temperature 97.8 F (36.6 C), temperature source Tympanic, resp. rate 17, height '5\' 3"'  (1.6 m), weight 201 lb (91.2 kg), SpO2 97 %.    HEENT: No thrush or ulcers. Resp: Lungs are clear.  Breath sounds slightly diminished at the right lower lung field.  Faint inspiratory rales right lower lung.  No respiratory distress. Cardio: Regular rate and rhythm. GI: Abdomen soft and nontender.  No hepatomegaly. Vascular: No leg edema. Skin: Palms without erythema. Port-A-Cath without erythema.   Lab Results:  Lab Results  Component Value Date   WBC 1.1 (L) 10/28/2019   HGB 10.7 (L) 10/28/2019   HCT 30.3 (L) 10/28/2019   MCV 93.8 10/28/2019   PLT 81 (L) 10/28/2019   NEUTROABS 0.4 (LL) 10/28/2019    Imaging:  No results found.  Medications: I have reviewed the patient's current medications.  Assessment/Plan: 1. Rectal cancer ? Mass at 7 cm from the anal verge on colonoscopy 08/09/2017, biopsy revealed invasive adenocarcinoma ? Staging CTs 08/17/2017-no evidence of metastatic disease, asymmetric thickening in the mid rectum ? MR pelvis 09/01/2017, T3N0 lesion beginning at 6.3 cm from the anal sphincter ? Radiation/Xeloda initiated 09/17/2017, completed 10/25/2017 ? Low anterior resection/diverting ileostomy 12/21/2017,ypT3,ypN1atumor. Lymphovascular invasion present, intact mismatch repair protein expression, treatment effect present (TRS 1). Mismatch repair  protein IHC normal;Foundation 1-KRAS/NRAS wild-type, microsatellite status and tumor mutational burden could not be determined. ? Cycle 1 adjuvant Xeloda beginning 01/21/2018 ? Cycle 2 adjuvant Xeloda beginning 02/11/2018 ? Xeloda discontinued after cycle 2 secondary to patient preference ? Ileostomy takedown 07/10/2018 ? CTs 09/07/2018-multiple live small pulmonary nodules concerning for metastatic disease ? CT chest 12/10/2018-multiple bilateral lung nodules, some have increased in size ? CT chest 04/08/2019-mild enlargement of bilateral lung nodules ? Status post SBRT multiple lung nodules 05/06/2019, 05/08/2019, 05/13/2019 ? CT chest 08/28/2019-improvement and resolution in majority of right-sided pulmonary nodules, a superior segment right lower lobe nodule has increased, no new right-sided nodules. Progressive enlargement of left-sided pulmonary nodules ? PET scan 09/09/2019-hypermetabolic right lower lobe nodule, 2 hypermetabolic left upper lobe nodules with an additional 0.7 cm left upper lobe nodule below PET resolution, groundglass opacity in the mid to right lower lobe with associated hypermetabolism consistent with postradiation change, hypermetabolic central [id number] lesion ? Cycle 1 FOLFOX 10/14/2019  2.Hypothyroid  3. 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  Neutropenia and thrombocytopenia following cycle 1 FOLFOX-plan chemotherapy dose reductions, add white cell growth factor support day of pump discontinuation.   Disposition: Samantha Lutz appears stable.  She has completed 1 cycle of FOLFOX.  CBC from today shows neutropenia and thrombocytopenia.  Precautions reviewed.  She understands to contact the office with fever, chills, other signs of infection, bleeding.  We are holding today's chemotherapy and rescheduling for 1 week.  Chemotherapy doses will be reduced and she  will receive white cell growth factor support on the day of pump discontinuation.  We reviewed potential toxicities associated with white cell growth factors including bone pain, rash, splenic rupture.  She will begin Cipro 500 mg twice daily for 5 days.  She agrees with the above.  Chemistry panel shows hypokalemia.  She will begin K-Dur, 40 mEq today then 20 mEq daily.  Magnesium level is in low normal range.   Abnormal lung exam above may be related to previous radiation.  She will contact the office if she develops dyspnea, worsening cough.  She will return for lab, follow-up, cycle two FOLFOX in 1 week.  We are available to see her sooner if needed.  Patient seen with Dr. Benay Spice.  Ned Card ANP/GNP-BC   10/28/2019  10:00 AM This was a shared visit with Ned Card. Samantha Lutz was interviewed and examined. She has developed neutropenia secondary to chemotherapy. She will call for a fever or symptoms of infection. Chemotherapy will be held today and G-CSF will be added with the next cycle of chemotherapy.  Julieanne Manson, MD

## 2019-10-28 NOTE — Addendum Note (Signed)
Addended by: Betsy Coder B on: 10/28/2019 06:14 PM   Modules accepted: Orders

## 2019-10-28 NOTE — Telephone Encounter (Signed)
Scheduled appointments per 9/28 los. Spoke to patient who is aware of upcoming appointments dates and times.

## 2019-10-30 ENCOUNTER — Inpatient Hospital Stay: Payer: Medicare Other

## 2019-11-04 ENCOUNTER — Other Ambulatory Visit: Payer: Medicare Other

## 2019-11-04 ENCOUNTER — Ambulatory Visit: Payer: Medicare Other | Admitting: Nurse Practitioner

## 2019-11-05 ENCOUNTER — Ambulatory Visit: Payer: Medicare Other

## 2019-11-05 ENCOUNTER — Inpatient Hospital Stay: Payer: Medicare Other | Attending: Oncology

## 2019-11-05 ENCOUNTER — Inpatient Hospital Stay: Payer: Medicare Other

## 2019-11-05 ENCOUNTER — Inpatient Hospital Stay: Payer: Medicare Other | Admitting: Oncology

## 2019-11-05 ENCOUNTER — Telehealth: Payer: Self-pay

## 2019-11-05 DIAGNOSIS — C20 Malignant neoplasm of rectum: Secondary | ICD-10-CM | POA: Insufficient documentation

## 2019-11-05 DIAGNOSIS — Z923 Personal history of irradiation: Secondary | ICD-10-CM | POA: Insufficient documentation

## 2019-11-05 DIAGNOSIS — D701 Agranulocytosis secondary to cancer chemotherapy: Secondary | ICD-10-CM | POA: Insufficient documentation

## 2019-11-05 DIAGNOSIS — E039 Hypothyroidism, unspecified: Secondary | ICD-10-CM | POA: Insufficient documentation

## 2019-11-05 DIAGNOSIS — Z452 Encounter for adjustment and management of vascular access device: Secondary | ICD-10-CM | POA: Insufficient documentation

## 2019-11-05 DIAGNOSIS — D6959 Other secondary thrombocytopenia: Secondary | ICD-10-CM | POA: Insufficient documentation

## 2019-11-05 DIAGNOSIS — Z5111 Encounter for antineoplastic chemotherapy: Secondary | ICD-10-CM | POA: Insufficient documentation

## 2019-11-05 DIAGNOSIS — Z79899 Other long term (current) drug therapy: Secondary | ICD-10-CM | POA: Insufficient documentation

## 2019-11-05 DIAGNOSIS — T451X5A Adverse effect of antineoplastic and immunosuppressive drugs, initial encounter: Secondary | ICD-10-CM | POA: Insufficient documentation

## 2019-11-05 NOTE — Progress Notes (Deleted)
  [place] OFFICE PROGRESS NOTE   Diagnosis:   INTERVAL HISTORY:   ***  Objective:  Vital signs in last 24 hours:  There were no vitals taken for this visit.    HEENT: *** Lymphatics: *** Resp: *** Cardio: *** GI: *** Vascular: *** Neuro:***  Skin:***   Portacath/PICC-without erythema  Lab Results:  Lab Results  Component Value Date   WBC 1.1 (L) 10/28/2019   HGB 10.7 (L) 10/28/2019   HCT 30.3 (L) 10/28/2019   MCV 93.8 10/28/2019   PLT 81 (L) 10/28/2019   NEUTROABS 0.4 (LL) 10/28/2019    CMP  Lab Results  Component Value Date   NA 140 10/28/2019   K 3.0 (LL) 10/28/2019   CL 108 10/28/2019   CO2 27 10/28/2019   GLUCOSE 128 (H) 10/28/2019   BUN 14 10/28/2019   CREATININE 0.79 10/28/2019   CALCIUM 9.0 10/28/2019   PROT 6.3 (L) 10/28/2019   ALBUMIN 2.8 (L) 10/28/2019   AST 93 (H) 10/28/2019   ALT 88 (H) 10/28/2019   ALKPHOS 195 (H) 10/28/2019   BILITOT 1.5 (H) 10/28/2019   GFRNONAA >60 10/28/2019   GFRAA >60 10/28/2019    Lab Results  Component Value Date   CEA1 1.17 10/13/2019    No results found for: INR  Imaging:  No results found.  Medications: I have reviewed the patient's current medications.   Assessment/Plan: 1. Rectal cancer ? Mass at 7 cm from the anal verge on colonoscopy 08/09/2017, biopsy revealed invasive adenocarcinoma ? Staging CTs 08/17/2017-no evidence of metastatic disease, asymmetric thickening in the mid rectum ? MR pelvis 09/01/2017, T3N0 lesion beginning at 6.3 cm from the anal sphincter ? Radiation/Xeloda initiated 09/17/2017, completed 10/25/2017 ? Low anterior resection/diverting ileostomy 12/21/2017,ypT3,ypN1atumor. Lymphovascular invasion present, intact mismatch repair protein expression, treatment effect present (TRS 1). Mismatch repair protein IHC normal;Foundation 1-KRAS/NRAS wild-type, microsatellite status and tumor mutational burden could not be determined. ? Cycle 1 adjuvant Xeloda  beginning 01/21/2018 ? Cycle 2 adjuvant Xeloda beginning 02/11/2018 ? Xeloda discontinued after cycle 2 secondary to patient preference ? Ileostomy takedown 07/10/2018 ? CTs 09/07/2018-multiple live small pulmonary nodules concerning for metastatic disease ? CT chest 12/10/2018-multiple bilateral lung nodules, some have increased in size ? CT chest 04/08/2019-mild enlargement of bilateral lung nodules ? Status post SBRT multiple lung nodules 05/06/2019, 05/08/2019, 05/13/2019 ? CT chest 08/28/2019-improvement and resolution in majority of right-sided pulmonary nodules, a superior segment right lower lobe nodule has increased, no new right-sided nodules. Progressive enlargement of left-sided pulmonary nodules ? PET scan 09/09/2019-hypermetabolic right lower lobe nodule, 2 hypermetabolic left upper lobe nodules with an additional 0.7 cm left upper lobe nodule below PET resolution, groundglass opacity in the mid to right lower lobe with associated hypermetabolism consistent with postradiation change, hypermetabolic central [id number] lesion ? Cycle 1 FOLFOX 10/14/2019  2.Hypothyroid  3. 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  Neutropenia and thrombocytopenia following cycle 1 FOLFOX-plan chemotherapy dose reductions, add white cell growth factor support day of pump discontinuation.     Disposition: ***  Betsy Coder, MD  11/05/2019  8:03 AM

## 2019-11-05 NOTE — Telephone Encounter (Signed)
Called patient about missing lab appointment and to see if patient was coming in. No answer. Left a message for patient to call back.

## 2019-11-07 ENCOUNTER — Inpatient Hospital Stay: Payer: Medicare Other

## 2019-11-11 ENCOUNTER — Other Ambulatory Visit: Payer: Self-pay

## 2019-11-11 ENCOUNTER — Inpatient Hospital Stay: Payer: Medicare Other

## 2019-11-11 ENCOUNTER — Inpatient Hospital Stay (HOSPITAL_BASED_OUTPATIENT_CLINIC_OR_DEPARTMENT_OTHER): Payer: Medicare Other | Admitting: Oncology

## 2019-11-11 VITALS — BP 152/69 | HR 89 | Temp 98.4°F | Resp 16 | Ht 63.0 in | Wt 199.5 lb

## 2019-11-11 DIAGNOSIS — C20 Malignant neoplasm of rectum: Secondary | ICD-10-CM

## 2019-11-11 DIAGNOSIS — Z5111 Encounter for antineoplastic chemotherapy: Secondary | ICD-10-CM | POA: Diagnosis not present

## 2019-11-11 DIAGNOSIS — Z923 Personal history of irradiation: Secondary | ICD-10-CM | POA: Diagnosis not present

## 2019-11-11 DIAGNOSIS — Z79899 Other long term (current) drug therapy: Secondary | ICD-10-CM | POA: Diagnosis not present

## 2019-11-11 DIAGNOSIS — T451X5A Adverse effect of antineoplastic and immunosuppressive drugs, initial encounter: Secondary | ICD-10-CM | POA: Diagnosis not present

## 2019-11-11 DIAGNOSIS — Z452 Encounter for adjustment and management of vascular access device: Secondary | ICD-10-CM | POA: Diagnosis not present

## 2019-11-11 DIAGNOSIS — E039 Hypothyroidism, unspecified: Secondary | ICD-10-CM | POA: Diagnosis not present

## 2019-11-11 DIAGNOSIS — D6959 Other secondary thrombocytopenia: Secondary | ICD-10-CM | POA: Diagnosis not present

## 2019-11-11 DIAGNOSIS — D701 Agranulocytosis secondary to cancer chemotherapy: Secondary | ICD-10-CM | POA: Diagnosis not present

## 2019-11-11 LAB — CBC WITH DIFFERENTIAL (CANCER CENTER ONLY)
Abs Immature Granulocytes: 0.02 10*3/uL (ref 0.00–0.07)
Basophils Absolute: 0 10*3/uL (ref 0.0–0.1)
Basophils Relative: 1 %
Eosinophils Absolute: 0.1 10*3/uL (ref 0.0–0.5)
Eosinophils Relative: 1 %
HCT: 35.8 % — ABNORMAL LOW (ref 36.0–46.0)
Hemoglobin: 12.3 g/dL (ref 12.0–15.0)
Immature Granulocytes: 1 %
Lymphocytes Relative: 12 %
Lymphs Abs: 0.5 10*3/uL — ABNORMAL LOW (ref 0.7–4.0)
MCH: 33.4 pg (ref 26.0–34.0)
MCHC: 34.4 g/dL (ref 30.0–36.0)
MCV: 97.3 fL (ref 80.0–100.0)
Monocytes Absolute: 0.6 10*3/uL (ref 0.1–1.0)
Monocytes Relative: 15 %
Neutro Abs: 2.9 10*3/uL (ref 1.7–7.7)
Neutrophils Relative %: 70 %
Platelet Count: 133 10*3/uL — ABNORMAL LOW (ref 150–400)
RBC: 3.68 MIL/uL — ABNORMAL LOW (ref 3.87–5.11)
RDW: 16.1 % — ABNORMAL HIGH (ref 11.5–15.5)
WBC Count: 4.2 10*3/uL (ref 4.0–10.5)
nRBC: 0 % (ref 0.0–0.2)

## 2019-11-11 LAB — CMP (CANCER CENTER ONLY)
ALT: 46 U/L — ABNORMAL HIGH (ref 0–44)
AST: 58 U/L — ABNORMAL HIGH (ref 15–41)
Albumin: 2.9 g/dL — ABNORMAL LOW (ref 3.5–5.0)
Alkaline Phosphatase: 194 U/L — ABNORMAL HIGH (ref 38–126)
Anion gap: 7 (ref 5–15)
BUN: 12 mg/dL (ref 8–23)
CO2: 26 mmol/L (ref 22–32)
Calcium: 9.1 mg/dL (ref 8.9–10.3)
Chloride: 108 mmol/L (ref 98–111)
Creatinine: 0.82 mg/dL (ref 0.44–1.00)
GFR, Estimated: >60 mL/min (ref >60)
Glucose, Bld: 105 mg/dL — ABNORMAL HIGH (ref 70–99)
Potassium: 4.5 mmol/L (ref 3.5–5.1)
Sodium: 141 mmol/L (ref 135–145)
Total Bilirubin: 1.4 mg/dL — ABNORMAL HIGH (ref 0.3–1.2)
Total Protein: 6.7 g/dL (ref 6.5–8.1)

## 2019-11-11 MED ORDER — DEXTROSE 5 % IV SOLN
Freq: Once | INTRAVENOUS | Status: DC
  Filled 2019-11-11: qty 250

## 2019-11-11 MED ORDER — PALONOSETRON HCL INJECTION 0.25 MG/5ML
INTRAVENOUS | Status: AC
  Filled 2019-11-11: qty 5

## 2019-11-11 MED ORDER — DEXTROSE 5 % IV SOLN
Freq: Once | INTRAVENOUS | Status: AC
  Filled 2019-11-11: qty 250

## 2019-11-11 MED ORDER — SODIUM CHLORIDE 0.9% FLUSH
10.0000 mL | Freq: Once | INTRAVENOUS | Status: AC
  Administered 2019-11-11: 10 mL via INTRAVENOUS
  Filled 2019-11-11: qty 10

## 2019-11-11 MED ORDER — SODIUM CHLORIDE 0.9 % IV SOLN
1800.0000 mg/m2 | INTRAVENOUS | Status: DC
  Administered 2019-11-11: 3650 mg via INTRAVENOUS
  Filled 2019-11-11: qty 73

## 2019-11-11 MED ORDER — HEPARIN SOD (PORK) LOCK FLUSH 100 UNIT/ML IV SOLN
500.0000 [IU] | Freq: Once | INTRAVENOUS | Status: DC | PRN
  Filled 2019-11-11: qty 5

## 2019-11-11 MED ORDER — LEUCOVORIN CALCIUM INJECTION 350 MG
300.0000 mg/m2 | Freq: Once | INTRAVENOUS | Status: AC
  Administered 2019-11-11: 606 mg via INTRAVENOUS
  Filled 2019-11-11: qty 30.3

## 2019-11-11 MED ORDER — SODIUM CHLORIDE 0.9 % IV SOLN
10.0000 mg | Freq: Once | INTRAVENOUS | Status: AC
  Administered 2019-11-11: 10 mg via INTRAVENOUS
  Filled 2019-11-11: qty 1
  Filled 2019-11-11: qty 10
  Filled 2019-11-11: qty 1

## 2019-11-11 MED ORDER — OXALIPLATIN CHEMO INJECTION 100 MG/20ML
50.0000 mg/m2 | Freq: Once | INTRAVENOUS | Status: AC
  Administered 2019-11-11: 100 mg via INTRAVENOUS
  Filled 2019-11-11: qty 20

## 2019-11-11 MED ORDER — SODIUM CHLORIDE 0.9% FLUSH
10.0000 mL | INTRAVENOUS | Status: DC | PRN
  Filled 2019-11-11: qty 10

## 2019-11-11 MED ORDER — PALONOSETRON HCL INJECTION 0.25 MG/5ML
0.2500 mg | Freq: Once | INTRAVENOUS | Status: AC
  Administered 2019-11-11: 0.25 mg via INTRAVENOUS

## 2019-11-11 NOTE — Patient Instructions (Signed)

## 2019-11-11 NOTE — Progress Notes (Signed)
[place] OFFICE PROGRESS NOTE   Diagnosis: Rectal cancer  INTERVAL HISTORY:   Ms. Linders was last seen on 10/28/2019.  Chemotherapy was held secondary to neutropenia.  She reports no fever.  She missed a scheduled office visit and chemotherapy last week.  She has developed alopecia.  Mild persistent cold sensitivity.  No other neuropathy symptoms.  She continues to have intermittent wheezing, especially when she is lying on the right side.  Objective:  Vital signs in last 24 hours:  Blood pressure (!) 152/69, pulse 89, temperature 98.4 F (36.9 C), temperature source Tympanic, resp. rate 16, height '5\' 3"'  (1.6 m), weight 199 lb 8 oz (90.5 kg), SpO2 97 %.    HEENT: No thrush or ulcers Resp: Scattered end inspiratory rales at the right posterior chest, no respiratory distress Cardio: Regular rate and rhythm GI: No hepatosplenomegaly, no mass, nontender Vascular: No leg edema    Portacath/PICC-without erythema  Lab Results:  Lab Results  Component Value Date   WBC 4.2 11/11/2019   HGB 12.3 11/11/2019   HCT 35.8 (L) 11/11/2019   MCV 97.3 11/11/2019   PLT 133 (L) 11/11/2019   NEUTROABS 2.9 11/11/2019    CMP  Lab Results  Component Value Date   NA 141 11/11/2019   K 4.5 11/11/2019   CL 108 11/11/2019   CO2 26 11/11/2019   GLUCOSE 105 (H) 11/11/2019   BUN 12 11/11/2019   CREATININE 0.82 11/11/2019   CALCIUM 9.1 11/11/2019   PROT 6.7 11/11/2019   ALBUMIN 2.9 (L) 11/11/2019   AST 58 (H) 11/11/2019   ALT 46 (H) 11/11/2019   ALKPHOS 194 (H) 11/11/2019   BILITOT 1.4 (H) 11/11/2019   GFRNONAA >60 11/11/2019   GFRAA >60 10/28/2019    Lab Results  Component Value Date   CEA1 1.17 10/13/2019   Medications: I have reviewed the patient's current medications.   Assessment/Plan: 1. Rectal cancer ? Mass at 7 cm from the anal verge on colonoscopy 08/09/2017, biopsy revealed invasive adenocarcinoma ? Staging CTs 08/17/2017-no evidence of metastatic  disease, asymmetric thickening in the mid rectum ? MR pelvis 09/01/2017, T3N0 lesion beginning at 6.3 cm from the anal sphincter ? Radiation/Xeloda initiated 09/17/2017, completed 10/25/2017 ? Low anterior resection/diverting ileostomy 12/21/2017,ypT3,ypN1atumor. Lymphovascular invasion present, intact mismatch repair protein expression, treatment effect present (TRS 1). Mismatch repair protein IHC normal;Foundation 1-KRAS/NRAS wild-type, microsatellite status and tumor mutational burden could not be determined. ? Cycle 1 adjuvant Xeloda beginning 01/21/2018 ? Cycle 2 adjuvant Xeloda beginning 02/11/2018 ? Xeloda discontinued after cycle 2 secondary to patient preference ? Ileostomy takedown 07/10/2018 ? CTs 09/07/2018-multiple live small pulmonary nodules concerning for metastatic disease ? CT chest 12/10/2018-multiple bilateral lung nodules, some have increased in size ? CT chest 04/08/2019-mild enlargement of bilateral lung nodules ? Status post SBRT multiple lung nodules 05/06/2019, 05/08/2019, 05/13/2019 ? CT chest 08/28/2019-improvement and resolution in majority of right-sided pulmonary nodules, a superior segment right lower lobe nodule has increased, no new right-sided nodules. Progressive enlargement of left-sided pulmonary nodules ? PET scan 09/09/2019-hypermetabolic right lower lobe nodule, 2 hypermetabolic left upper lobe nodules with an additional 0.7 cm left upper lobe nodule below PET resolution, groundglass opacity in the mid to right lower lobe with associated hypermetabolism consistent with postradiation change, hypermetabolic central [id number] lesion ? Cycle 1 FOLFOX 10/14/2019 ? Cycle  2FOLFOX 11/11/2019, 5-FU and oxaliplatin dose reduced secondary to neutropenia and thrombocytopenia following cycle one, G-CSF declined  2.Hypothyroid  3. History of mild thrombocytopenia secondary to chemotherapy  and radiation  4.Port-A-Cath placement 09/29/2019, interventional  radiology  5.  Neutropenia and thrombocytopenia following cycle 1 FOLFOX-plan chemotherapy dose reductions, she declined G-CSF    Disposition: Ms. Polgar has completed one cycle of FOLFOX.  She tolerated the chemotherapy well other than development of neutropenia.  She will complete cycle two chemotherapy today.  She declines G-CSF therapy.  I will dose reduce the 5-FU and oxaliplatin.  She knows to call for a fever.  Ms. Drozdowski will return for an office visit and chemotherapy in 2 weeks.  I suspect the respiratory findings are related to radiation.  Betsy Coder, MD  11/11/2019  9:55 AM

## 2019-11-13 ENCOUNTER — Other Ambulatory Visit: Payer: Self-pay

## 2019-11-13 ENCOUNTER — Inpatient Hospital Stay: Payer: Medicare Other

## 2019-11-13 ENCOUNTER — Telehealth: Payer: Self-pay | Admitting: Oncology

## 2019-11-13 VITALS — BP 138/65 | HR 80 | Resp 16

## 2019-11-13 DIAGNOSIS — C20 Malignant neoplasm of rectum: Secondary | ICD-10-CM | POA: Diagnosis not present

## 2019-11-13 MED ORDER — HEPARIN SOD (PORK) LOCK FLUSH 100 UNIT/ML IV SOLN
500.0000 [IU] | Freq: Once | INTRAVENOUS | Status: AC | PRN
  Administered 2019-11-13: 500 [IU]
  Filled 2019-11-13: qty 5

## 2019-11-13 MED ORDER — SODIUM CHLORIDE 0.9% FLUSH
10.0000 mL | INTRAVENOUS | Status: DC | PRN
  Administered 2019-11-13: 10 mL
  Filled 2019-11-13: qty 10

## 2019-11-13 NOTE — Telephone Encounter (Signed)
Scheduled appointments per 10/12 los. Spoke to patient who is aware of appointments dates and times. Had to decouple appointments, was approved by MD.

## 2019-11-23 ENCOUNTER — Other Ambulatory Visit: Payer: Self-pay | Admitting: Oncology

## 2019-11-25 ENCOUNTER — Other Ambulatory Visit: Payer: Self-pay

## 2019-11-25 ENCOUNTER — Inpatient Hospital Stay: Payer: Medicare Other

## 2019-11-25 ENCOUNTER — Inpatient Hospital Stay (HOSPITAL_BASED_OUTPATIENT_CLINIC_OR_DEPARTMENT_OTHER): Payer: Medicare Other | Admitting: Oncology

## 2019-11-25 VITALS — BP 141/70 | HR 88 | Temp 99.2°F | Resp 17 | Ht 63.0 in | Wt 201.0 lb

## 2019-11-25 DIAGNOSIS — C20 Malignant neoplasm of rectum: Secondary | ICD-10-CM | POA: Diagnosis not present

## 2019-11-25 DIAGNOSIS — Z95828 Presence of other vascular implants and grafts: Secondary | ICD-10-CM | POA: Insufficient documentation

## 2019-11-25 LAB — CBC WITH DIFFERENTIAL (CANCER CENTER ONLY)
Abs Immature Granulocytes: 0.01 10*3/uL (ref 0.00–0.07)
Basophils Absolute: 0 10*3/uL (ref 0.0–0.1)
Basophils Relative: 1 %
Eosinophils Absolute: 0.2 10*3/uL (ref 0.0–0.5)
Eosinophils Relative: 4 %
HCT: 33.7 % — ABNORMAL LOW (ref 36.0–46.0)
Hemoglobin: 11.5 g/dL — ABNORMAL LOW (ref 12.0–15.0)
Immature Granulocytes: 0 %
Lymphocytes Relative: 18 %
Lymphs Abs: 0.8 10*3/uL (ref 0.7–4.0)
MCH: 33.3 pg (ref 26.0–34.0)
MCHC: 34.1 g/dL (ref 30.0–36.0)
MCV: 97.7 fL (ref 80.0–100.0)
Monocytes Absolute: 1 10*3/uL (ref 0.1–1.0)
Monocytes Relative: 24 %
Neutro Abs: 2.3 10*3/uL (ref 1.7–7.7)
Neutrophils Relative %: 53 %
Platelet Count: 110 10*3/uL — ABNORMAL LOW (ref 150–400)
RBC: 3.45 MIL/uL — ABNORMAL LOW (ref 3.87–5.11)
RDW: 16.3 % — ABNORMAL HIGH (ref 11.5–15.5)
WBC Count: 4.3 10*3/uL (ref 4.0–10.5)
nRBC: 0 % (ref 0.0–0.2)

## 2019-11-25 LAB — CMP (CANCER CENTER ONLY)
ALT: 33 U/L (ref 0–44)
AST: 47 U/L — ABNORMAL HIGH (ref 15–41)
Albumin: 3 g/dL — ABNORMAL LOW (ref 3.5–5.0)
Alkaline Phosphatase: 161 U/L — ABNORMAL HIGH (ref 38–126)
Anion gap: 8 (ref 5–15)
BUN: 15 mg/dL (ref 8–23)
CO2: 24 mmol/L (ref 22–32)
Calcium: 9.2 mg/dL (ref 8.9–10.3)
Chloride: 108 mmol/L (ref 98–111)
Creatinine: 0.76 mg/dL (ref 0.44–1.00)
GFR, Estimated: >60 mL/min (ref >60)
Glucose, Bld: 83 mg/dL (ref 70–99)
Potassium: 4 mmol/L (ref 3.5–5.1)
Sodium: 140 mmol/L (ref 135–145)
Total Bilirubin: 1.1 mg/dL (ref 0.3–1.2)
Total Protein: 6.5 g/dL (ref 6.5–8.1)

## 2019-11-25 MED ORDER — HEPARIN SOD (PORK) LOCK FLUSH 100 UNIT/ML IV SOLN
500.0000 [IU] | Freq: Once | INTRAVENOUS | Status: AC
  Administered 2019-11-25: 500 [IU]
  Filled 2019-11-25: qty 5

## 2019-11-25 MED ORDER — SODIUM CHLORIDE 0.9% FLUSH
10.0000 mL | Freq: Once | INTRAVENOUS | Status: AC
  Administered 2019-11-25: 10 mL
  Filled 2019-11-25: qty 10

## 2019-11-25 NOTE — Progress Notes (Signed)
[place] OFFICE PROGRESS NOTE   Diagnosis: Rectal cancer  INTERVAL HISTORY:   Samantha Lutz completed another cycle of FOLFOX on 11/11/2019.  No nausea/vomiting, mouth sores, or diarrhea.  She had cold sensitivity and mild peripheral tingling.  No other neuropathy symptoms.  Objective:  Vital signs in last 24 hours:  Blood pressure (!) 141/70, pulse 88, temperature 99.2 F (37.3 C), temperature source Tympanic, resp. rate 17, height '5\' 3"'  (1.6 m), weight 201 lb (91.2 kg), SpO2 97 %.    HEENT: No thrush or ulcers Resp: Lungs clear bilaterally Cardio: Regular rate and rhythm GI: No hepatomegaly, nontender Vascular: No leg edema  Skin: Palms without erythema  Portacath/PICC-without erythema  Lab Results:  Lab Results  Component Value Date   WBC 4.3 11/25/2019   HGB 11.5 (L) 11/25/2019   HCT 33.7 (L) 11/25/2019   MCV 97.7 11/25/2019   PLT 110 (L) 11/25/2019   NEUTROABS 2.3 11/25/2019    CMP  Lab Results  Component Value Date   NA 140 11/25/2019   K 4.0 11/25/2019   CL 108 11/25/2019   CO2 24 11/25/2019   GLUCOSE 83 11/25/2019   BUN 15 11/25/2019   CREATININE 0.76 11/25/2019   CALCIUM 9.2 11/25/2019   PROT 6.5 11/25/2019   ALBUMIN 3.0 (L) 11/25/2019   AST 47 (H) 11/25/2019   ALT 33 11/25/2019   ALKPHOS 161 (H) 11/25/2019   BILITOT 1.1 11/25/2019   GFRNONAA >60 11/25/2019   GFRAA >60 10/28/2019    Lab Results  Component Value Date   CEA1 1.17 10/13/2019    Medications: I have reviewed the patient's current medications.   Assessment/Plan:  1. Rectal cancer ? Mass at 7 cm from the anal verge on colonoscopy 08/09/2017, biopsy revealed invasive adenocarcinoma ? Staging CTs 08/17/2017-no evidence of metastatic disease, asymmetric thickening in the mid rectum ? MR pelvis 09/01/2017, T3N0 lesion beginning at 6.3 cm from the anal sphincter ? Radiation/Xeloda initiated 09/17/2017, completed 10/25/2017 ? Low anterior resection/diverting  ileostomy 12/21/2017,ypT3,ypN1atumor. Lymphovascular invasion present, intact mismatch repair protein expression, treatment effect present (TRS 1). Mismatch repair protein IHC normal;Foundation 1-KRAS/NRAS wild-type, microsatellite status and tumor mutational burden could not be determined. ? Cycle 1 adjuvant Xeloda beginning 01/21/2018 ? Cycle 2 adjuvant Xeloda beginning 02/11/2018 ? Xeloda discontinued after cycle 2 secondary to patient preference ? Ileostomy takedown 07/10/2018 ? CTs 09/07/2018-multiple live small pulmonary nodules concerning for metastatic disease ? CT chest 12/10/2018-multiple bilateral lung nodules, some have increased in size ? CT chest 04/08/2019-mild enlargement of bilateral lung nodules ? Status post SBRT multiple lung nodules 05/06/2019, 05/08/2019, 05/13/2019 ? CT chest 08/28/2019-improvement and resolution in majority of right-sided pulmonary nodules, a superior segment right lower lobe nodule has increased, no new right-sided nodules. Progressive enlargement of left-sided pulmonary nodules ? PET scan 09/09/2019-hypermetabolic right lower lobe nodule, 2 hypermetabolic left upper lobe nodules with an additional 0.7 cm left upper lobe nodule below PET resolution, groundglass opacity in the mid to right lower lobe with associated hypermetabolism consistent with postradiation change, hypermetabolic central [id number] lesion ? Cycle 1 FOLFOX 10/14/2019 ? Cycle  2FOLFOX 11/11/2019, 5-FU and oxaliplatin dose reduced secondary to neutropenia and thrombocytopenia following cycle one, G-CSF declined ? Cycle 3 FOLFOX 11/26/2019  2.Hypothyroid  3. 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  Neutropenia and thrombocytopenia following cycle 1 FOLFOX-plan chemotherapy dose reductions, she declined G-CSF     Disposition: Samantha Lutz tolerated the second cycle of  FOLFOX  well.  She will complete cycle 3 tomorrow.  She will return for an office visit and chemotherapy in 2 weeks.  The plan is to schedule a restaging CT evaluation after 5 cycles of FOLFOX.  Chemotherapy will remain dose reduced secondary to the history of neutropenia/thrombocytopenia.  Betsy Coder, MD  11/25/2019  4:09 PM

## 2019-11-26 ENCOUNTER — Other Ambulatory Visit: Payer: Medicare Other

## 2019-11-26 ENCOUNTER — Ambulatory Visit: Payer: Medicare Other | Admitting: Nurse Practitioner

## 2019-11-26 ENCOUNTER — Telehealth: Payer: Self-pay | Admitting: Oncology

## 2019-11-26 ENCOUNTER — Inpatient Hospital Stay: Payer: Medicare Other

## 2019-11-26 ENCOUNTER — Other Ambulatory Visit: Payer: Self-pay

## 2019-11-26 VITALS — BP 149/72 | HR 81 | Temp 98.6°F | Resp 18

## 2019-11-26 DIAGNOSIS — C20 Malignant neoplasm of rectum: Secondary | ICD-10-CM

## 2019-11-26 MED ORDER — SODIUM CHLORIDE 0.9 % IV SOLN
1800.0000 mg/m2 | INTRAVENOUS | Status: DC
  Administered 2019-11-26: 3650 mg via INTRAVENOUS
  Filled 2019-11-26: qty 73

## 2019-11-26 MED ORDER — PALONOSETRON HCL INJECTION 0.25 MG/5ML
0.2500 mg | Freq: Once | INTRAVENOUS | Status: AC
  Administered 2019-11-26: 0.25 mg via INTRAVENOUS

## 2019-11-26 MED ORDER — SODIUM CHLORIDE 0.9 % IV SOLN
10.0000 mg | Freq: Once | INTRAVENOUS | Status: AC
  Administered 2019-11-26: 10 mg via INTRAVENOUS
  Filled 2019-11-26: qty 10

## 2019-11-26 MED ORDER — DEXTROSE 5 % IV SOLN
Freq: Once | INTRAVENOUS | Status: AC
  Filled 2019-11-26: qty 250

## 2019-11-26 MED ORDER — PALONOSETRON HCL INJECTION 0.25 MG/5ML
INTRAVENOUS | Status: AC
  Filled 2019-11-26: qty 5

## 2019-11-26 MED ORDER — SODIUM CHLORIDE 0.9% FLUSH
10.0000 mL | INTRAVENOUS | Status: DC | PRN
  Filled 2019-11-26: qty 10

## 2019-11-26 MED ORDER — LEUCOVORIN CALCIUM INJECTION 350 MG
300.0000 mg/m2 | Freq: Once | INTRAVENOUS | Status: AC
  Administered 2019-11-26: 606 mg via INTRAVENOUS
  Filled 2019-11-26: qty 30.3

## 2019-11-26 MED ORDER — OXALIPLATIN CHEMO INJECTION 100 MG/20ML
50.0000 mg/m2 | Freq: Once | INTRAVENOUS | Status: AC
  Administered 2019-11-26: 100 mg via INTRAVENOUS
  Filled 2019-11-26: qty 20

## 2019-11-26 NOTE — Patient Instructions (Signed)
[facility] Discharge Instructions for Patients Receiving Chemotherapy  Today you received the following chemotherapy agents Oxaliplatin, Leucovorin, 5FU  To help prevent nausea and vomiting after your treatment, we encourage you to take your nausea medication as directed   If you develop nausea and vomiting that is not controlled by your nausea medication, call the clinic.   BELOW ARE SYMPTOMS THAT SHOULD BE REPORTED IMMEDIATELY:  *FEVER GREATER THAN 100.5 F  *CHILLS WITH OR WITHOUT FEVER  NAUSEA AND VOMITING THAT IS NOT CONTROLLED WITH YOUR NAUSEA MEDICATION  *UNUSUAL SHORTNESS OF BREATH  *UNUSUAL BRUISING OR BLEEDING  TENDERNESS IN MOUTH AND THROAT WITH OR WITHOUT PRESENCE OF ULCERS  *URINARY PROBLEMS  *BOWEL PROBLEMS  UNUSUAL RASH Items with * indicate a potential emergency and should be followed up as soon as possible.  Feel free to call the clinic should you have any questions or concerns. The clinic phone number is [phone].  Please show the CHEMO ALERT CARD at check-in to the Emergency Department and triage nurse.   

## 2019-11-26 NOTE — Telephone Encounter (Signed)
Scheduled appointments per 10/26 los. Spoke to patient who is aware of appointment date and time.  

## 2019-11-28 ENCOUNTER — Inpatient Hospital Stay: Payer: Medicare Other

## 2019-11-28 ENCOUNTER — Other Ambulatory Visit: Payer: Self-pay

## 2019-11-28 VITALS — BP 133/60 | HR 80 | Resp 16

## 2019-11-28 DIAGNOSIS — C20 Malignant neoplasm of rectum: Secondary | ICD-10-CM

## 2019-11-28 MED ORDER — HEPARIN SOD (PORK) LOCK FLUSH 100 UNIT/ML IV SOLN
500.0000 [IU] | Freq: Once | INTRAVENOUS | Status: AC | PRN
  Administered 2019-11-28: 500 [IU]
  Filled 2019-11-28: qty 5

## 2019-11-28 MED ORDER — SODIUM CHLORIDE 0.9% FLUSH
10.0000 mL | INTRAVENOUS | Status: DC | PRN
  Administered 2019-11-28: 10 mL
  Filled 2019-11-28: qty 10

## 2019-11-28 NOTE — Patient Instructions (Signed)

## 2019-12-07 ENCOUNTER — Other Ambulatory Visit: Payer: Self-pay | Admitting: Oncology

## 2019-12-10 MED FILL — Dexamethasone Sodium Phosphate Inj 100 MG/10ML: INTRAMUSCULAR | Qty: 1 | Status: AC

## 2019-12-11 ENCOUNTER — Inpatient Hospital Stay (HOSPITAL_BASED_OUTPATIENT_CLINIC_OR_DEPARTMENT_OTHER): Payer: Medicare Other | Admitting: Nurse Practitioner

## 2019-12-11 ENCOUNTER — Inpatient Hospital Stay: Payer: Medicare Other | Attending: Oncology

## 2019-12-11 ENCOUNTER — Encounter: Payer: Self-pay | Admitting: Nurse Practitioner

## 2019-12-11 ENCOUNTER — Inpatient Hospital Stay: Payer: Medicare Other

## 2019-12-11 ENCOUNTER — Other Ambulatory Visit: Payer: Self-pay

## 2019-12-11 VITALS — BP 139/74 | HR 87 | Temp 98.6°F | Resp 16 | Ht 63.0 in | Wt 203.4 lb

## 2019-12-11 DIAGNOSIS — E039 Hypothyroidism, unspecified: Secondary | ICD-10-CM | POA: Diagnosis not present

## 2019-12-11 DIAGNOSIS — C20 Malignant neoplasm of rectum: Secondary | ICD-10-CM | POA: Diagnosis present

## 2019-12-11 DIAGNOSIS — Z95828 Presence of other vascular implants and grafts: Secondary | ICD-10-CM

## 2019-12-11 DIAGNOSIS — D701 Agranulocytosis secondary to cancer chemotherapy: Secondary | ICD-10-CM | POA: Diagnosis not present

## 2019-12-11 DIAGNOSIS — D696 Thrombocytopenia, unspecified: Secondary | ICD-10-CM | POA: Diagnosis not present

## 2019-12-11 DIAGNOSIS — Z5111 Encounter for antineoplastic chemotherapy: Secondary | ICD-10-CM | POA: Insufficient documentation

## 2019-12-11 DIAGNOSIS — Z923 Personal history of irradiation: Secondary | ICD-10-CM | POA: Diagnosis not present

## 2019-12-11 LAB — CMP (CANCER CENTER ONLY)
ALT: 16 U/L (ref 0–44)
AST: 26 U/L (ref 15–41)
Albumin: 2.8 g/dL — ABNORMAL LOW (ref 3.5–5.0)
Alkaline Phosphatase: 91 U/L (ref 38–126)
Anion gap: 9 (ref 5–15)
BUN: 11 mg/dL (ref 8–23)
CO2: 24 mmol/L (ref 22–32)
Calcium: 8.5 mg/dL — ABNORMAL LOW (ref 8.9–10.3)
Chloride: 109 mmol/L (ref 98–111)
Creatinine: 0.75 mg/dL (ref 0.44–1.00)
GFR, Estimated: >60 mL/min (ref >60)
Glucose, Bld: 99 mg/dL (ref 70–99)
Potassium: 3.5 mmol/L (ref 3.5–5.1)
Sodium: 142 mmol/L (ref 135–145)
Total Bilirubin: 1.2 mg/dL (ref 0.3–1.2)
Total Protein: 6.1 g/dL — ABNORMAL LOW (ref 6.5–8.1)

## 2019-12-11 LAB — CBC WITH DIFFERENTIAL (CANCER CENTER ONLY)
Abs Immature Granulocytes: 0.01 10*3/uL (ref 0.00–0.07)
Basophils Absolute: 0 10*3/uL (ref 0.0–0.1)
Basophils Relative: 0 %
Eosinophils Absolute: 0.1 10*3/uL (ref 0.0–0.5)
Eosinophils Relative: 5 %
HCT: 31 % — ABNORMAL LOW (ref 36.0–46.0)
Hemoglobin: 10.8 g/dL — ABNORMAL LOW (ref 12.0–15.0)
Immature Granulocytes: 0 %
Lymphocytes Relative: 18 %
Lymphs Abs: 0.5 10*3/uL — ABNORMAL LOW (ref 0.7–4.0)
MCH: 34.4 pg — ABNORMAL HIGH (ref 26.0–34.0)
MCHC: 34.8 g/dL (ref 30.0–36.0)
MCV: 98.7 fL (ref 80.0–100.0)
Monocytes Absolute: 0.7 10*3/uL (ref 0.1–1.0)
Monocytes Relative: 24 %
Neutro Abs: 1.5 10*3/uL — ABNORMAL LOW (ref 1.7–7.7)
Neutrophils Relative %: 53 %
Platelet Count: 84 10*3/uL — ABNORMAL LOW (ref 150–400)
RBC: 3.14 MIL/uL — ABNORMAL LOW (ref 3.87–5.11)
RDW: 16.8 % — ABNORMAL HIGH (ref 11.5–15.5)
WBC Count: 2.9 10*3/uL — ABNORMAL LOW (ref 4.0–10.5)
nRBC: 0 % (ref 0.0–0.2)

## 2019-12-11 MED ORDER — SODIUM CHLORIDE 0.9% FLUSH
10.0000 mL | INTRAVENOUS | Status: DC | PRN
  Filled 2019-12-11: qty 10

## 2019-12-11 MED ORDER — SODIUM CHLORIDE 0.9 % IV SOLN
1800.0000 mg/m2 | INTRAVENOUS | Status: DC
  Administered 2019-12-11: 3650 mg via INTRAVENOUS
  Filled 2019-12-11: qty 73

## 2019-12-11 MED ORDER — SODIUM CHLORIDE 0.9% FLUSH
10.0000 mL | Freq: Once | INTRAVENOUS | Status: AC
  Administered 2019-12-11: 10 mL
  Filled 2019-12-11: qty 10

## 2019-12-11 NOTE — Progress Notes (Signed)
[facility] OFFICE PROGRESS NOTE   Diagnosis: Rectal cancer  INTERVAL HISTORY:   Samantha Lutz returns as scheduled.  She completed cycle 3 FOLFOX 11/26/2019.  She denies nausea/vomiting.  No mouth sores.  No diarrhea.  Cold sensitivity lasted about 1 week.  No persistent neuropathy symptoms.  She noted fatigue after the chemotherapy.  Objective:  Vital signs in last 24 hours:  Blood pressure 139/74, pulse 87, temperature 98.6 F (37 C), temperature source Tympanic, resp. rate 16, height '5\' 3"'  (1.6 m), weight 203 lb 6.4 oz (92.3 kg), SpO2 98 %.    HEENT: No thrush or ulcers. Resp: Lungs clear bilaterally. Cardio: Regular rate and rhythm. GI: Abdomen soft and nontender.  No hepatomegaly. Vascular: No leg edema. Skin: Palms without erythema. Port-A-Cath without erythema.   Lab Results:  Lab Results  Component Value Date   WBC 2.9 (L) 12/11/2019   HGB 10.8 (L) 12/11/2019   HCT 31.0 (L) 12/11/2019   MCV 98.7 12/11/2019   PLT 84 (L) 12/11/2019   NEUTROABS 1.5 (L) 12/11/2019    Imaging:  No results found.  Medications: I have reviewed the patient's current medications.  Assessment/Plan: 1. Rectal cancer ? Mass at 7 cm from the anal verge on colonoscopy 08/09/2017, biopsy revealed invasive adenocarcinoma ? Staging CTs 08/17/2017-no evidence of metastatic disease, asymmetric thickening in the mid rectum ? MR pelvis 09/01/2017, T3N0 lesion beginning at 6.3 cm from the anal sphincter ? Radiation/Xeloda initiated 09/17/2017, completed 10/25/2017 ? Low anterior resection/diverting ileostomy 12/21/2017,ypT3,ypN1atumor. Lymphovascular invasion present, intact mismatch repair protein expression, treatment effect present (TRS 1). Mismatch repair protein IHC normal;Foundation 1-KRAS/NRAS wild-type, microsatellite status and tumor mutational burden could not be determined. ? Cycle 1 adjuvant Xeloda beginning 01/21/2018 ? Cycle 2 adjuvant Xeloda beginning  02/11/2018 ? Xeloda discontinued after cycle 2 secondary to patient preference ? Ileostomy takedown 07/10/2018 ? CTs 09/07/2018-multiple live small pulmonary nodules concerning for metastatic disease ? CT chest 12/10/2018-multiple bilateral lung nodules, some have increased in size ? CT chest 04/08/2019-mild enlargement of bilateral lung nodules ? Status post SBRT multiple lung nodules 05/06/2019, 05/08/2019, 05/13/2019 ? CT chest 08/28/2019-improvement and resolution in majority of right-sided pulmonary nodules, a superior segment right lower lobe nodule has increased, no new right-sided nodules. Progressive enlargement of left-sided pulmonary nodules ? PET scan 09/09/2019-hypermetabolic right lower lobe nodule, 2 hypermetabolic left upper lobe nodules with an additional 0.7 cm left upper lobe nodule below PET resolution, groundglass opacity in the mid to right lower lobe with associated hypermetabolism consistent with postradiation change, hypermetabolic central [id number] lesion ? Cycle 1 FOLFOX 10/14/2019 ? Cycle  2FOLFOX 11/11/2019, 5-FU and oxaliplatin dose reduced secondary to neutropenia and thrombocytopenia following cycle one, G-CSF declined ? Cycle 3 FOLFOX 11/26/2019 ? Cycle 4 FOLFOX 12/11/2019, oxaliplatin held due to neutropenia and thrombocytopenia  2.Hypothyroid  3. 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  Neutropenia and thrombocytopenia following cycle 1 FOLFOX-plan chemotherapy dose reductions, she declined G-CSF    Disposition: Samantha Lutz appears stable.  She has completed 3 cycles of FOLFOX.  Plan to proceed with cycle 4 today as scheduled.  Oxaliplatin will be held due to neutropenia and thrombocytopenia.  We reviewed the CBC from today.  She has mild neutropenia and moderate thrombocytopenia.  Precautions were reviewed.  She understands to contact the office  with fever, chills, other signs of infection, bleeding.  As noted above oxaliplatin will be held today.  She will return for  lab, follow-up, cycle 5 FOLFOX as scheduled on 12/31/2019.  She will contact the office in the interim as outlined above or with any other problems.  Plan reviewed with Dr. Benay Spice.    Ned Card ANP/GNP-BC   12/11/2019  9:56 AM

## 2019-12-11 NOTE — Patient Instructions (Signed)
[facility] Discharge Instructions for Patients Receiving Chemotherapy  Today you received the following chemotherapy agents: Fluorouracil  To help prevent nausea and vomiting after your treatment, we encourage you to take your nausea medication as directed.    If you develop nausea and vomiting that is not controlled by your nausea medication, call the clinic.   BELOW ARE SYMPTOMS THAT SHOULD BE REPORTED IMMEDIATELY:  *FEVER GREATER THAN 100.5 F  *CHILLS WITH OR WITHOUT FEVER  NAUSEA AND VOMITING THAT IS NOT CONTROLLED WITH YOUR NAUSEA MEDICATION  *UNUSUAL SHORTNESS OF BREATH  *UNUSUAL BRUISING OR BLEEDING  TENDERNESS IN MOUTH AND THROAT WITH OR WITHOUT PRESENCE OF ULCERS  *URINARY PROBLEMS  *BOWEL PROBLEMS  UNUSUAL RASH Items with * indicate a potential emergency and should be followed up as soon as possible.  Feel free to call the clinic should you have any questions or concerns. The clinic phone number is [phone].  Please show the [facility] at check-in to the Emergency Department and triage nurse.

## 2019-12-12 ENCOUNTER — Telehealth: Payer: Self-pay | Admitting: Nurse Practitioner

## 2019-12-12 NOTE — Telephone Encounter (Signed)
Scheduled appointments per 11/11 los. Will have updated calendar printed for patient at next visit.

## 2019-12-13 ENCOUNTER — Other Ambulatory Visit: Payer: Self-pay

## 2019-12-13 ENCOUNTER — Inpatient Hospital Stay: Payer: Medicare Other

## 2019-12-13 VITALS — BP 136/76 | HR 95 | Temp 98.2°F | Resp 20

## 2019-12-13 DIAGNOSIS — Z5111 Encounter for antineoplastic chemotherapy: Secondary | ICD-10-CM | POA: Diagnosis not present

## 2019-12-13 DIAGNOSIS — C20 Malignant neoplasm of rectum: Secondary | ICD-10-CM

## 2019-12-13 MED ORDER — SODIUM CHLORIDE 0.9% FLUSH
10.0000 mL | INTRAVENOUS | Status: DC | PRN
  Administered 2019-12-13: 10 mL
  Filled 2019-12-13: qty 10

## 2019-12-13 MED ORDER — HEPARIN SOD (PORK) LOCK FLUSH 100 UNIT/ML IV SOLN
500.0000 [IU] | Freq: Once | INTRAVENOUS | Status: AC | PRN
  Administered 2019-12-13: 500 [IU]
  Filled 2019-12-13: qty 5

## 2019-12-28 ENCOUNTER — Other Ambulatory Visit: Payer: Self-pay | Admitting: Oncology

## 2019-12-31 ENCOUNTER — Other Ambulatory Visit: Payer: Self-pay

## 2019-12-31 ENCOUNTER — Inpatient Hospital Stay (HOSPITAL_BASED_OUTPATIENT_CLINIC_OR_DEPARTMENT_OTHER): Payer: Medicare Other | Admitting: Oncology

## 2019-12-31 ENCOUNTER — Inpatient Hospital Stay: Payer: Medicare Other | Attending: Oncology

## 2019-12-31 ENCOUNTER — Ambulatory Visit (HOSPITAL_COMMUNITY)
Admission: RE | Admit: 2019-12-31 | Discharge: 2019-12-31 | Disposition: A | Discharge status: Home / Self Care | Payer: Medicare Other | Source: Ambulatory Visit | Attending: Oncology | Admitting: Oncology

## 2019-12-31 ENCOUNTER — Inpatient Hospital Stay: Payer: Medicare Other

## 2019-12-31 ENCOUNTER — Other Ambulatory Visit: Payer: Self-pay | Admitting: *Deleted

## 2019-12-31 VITALS — BP 159/78 | HR 91 | Temp 98.9°F | Resp 17 | Ht 63.0 in | Wt 201.4 lb

## 2019-12-31 DIAGNOSIS — Z923 Personal history of irradiation: Secondary | ICD-10-CM | POA: Insufficient documentation

## 2019-12-31 DIAGNOSIS — C7802 Secondary malignant neoplasm of left lung: Secondary | ICD-10-CM | POA: Diagnosis not present

## 2019-12-31 DIAGNOSIS — E039 Hypothyroidism, unspecified: Secondary | ICD-10-CM | POA: Diagnosis not present

## 2019-12-31 DIAGNOSIS — C20 Malignant neoplasm of rectum: Secondary | ICD-10-CM

## 2019-12-31 DIAGNOSIS — Z95828 Presence of other vascular implants and grafts: Secondary | ICD-10-CM

## 2019-12-31 DIAGNOSIS — Z5111 Encounter for antineoplastic chemotherapy: Secondary | ICD-10-CM | POA: Diagnosis not present

## 2019-12-31 LAB — CBC WITH DIFFERENTIAL (CANCER CENTER ONLY)
Abs Immature Granulocytes: 0.01 10*3/uL (ref 0.00–0.07)
Basophils Absolute: 0 10*3/uL (ref 0.0–0.1)
Basophils Relative: 1 %
Eosinophils Absolute: 0.3 10*3/uL (ref 0.0–0.5)
Eosinophils Relative: 5 %
HCT: 33.1 % — ABNORMAL LOW (ref 36.0–46.0)
Hemoglobin: 11.5 g/dL — ABNORMAL LOW (ref 12.0–15.0)
Immature Granulocytes: 0 %
Lymphocytes Relative: 10 %
Lymphs Abs: 0.5 10*3/uL — ABNORMAL LOW (ref 0.7–4.0)
MCH: 35 pg — ABNORMAL HIGH (ref 26.0–34.0)
MCHC: 34.7 g/dL (ref 30.0–36.0)
MCV: 100.6 fL — ABNORMAL HIGH (ref 80.0–100.0)
Monocytes Absolute: 0.8 10*3/uL (ref 0.1–1.0)
Monocytes Relative: 15 %
Neutro Abs: 3.7 10*3/uL (ref 1.7–7.7)
Neutrophils Relative %: 69 %
Platelet Count: 116 10*3/uL — ABNORMAL LOW (ref 150–400)
RBC: 3.29 MIL/uL — ABNORMAL LOW (ref 3.87–5.11)
RDW: 16 % — ABNORMAL HIGH (ref 11.5–15.5)
WBC Count: 5.4 10*3/uL (ref 4.0–10.5)
nRBC: 0 % (ref 0.0–0.2)

## 2019-12-31 LAB — CMP (CANCER CENTER ONLY)
ALT: 13 U/L (ref 0–44)
AST: 25 U/L (ref 15–41)
Albumin: 2.8 g/dL — ABNORMAL LOW (ref 3.5–5.0)
Alkaline Phosphatase: 78 U/L (ref 38–126)
Anion gap: 8 (ref 5–15)
BUN: 10 mg/dL (ref 8–23)
CO2: 23 mmol/L (ref 22–32)
Calcium: 8.9 mg/dL (ref 8.9–10.3)
Chloride: 110 mmol/L (ref 98–111)
Creatinine: 0.76 mg/dL (ref 0.44–1.00)
GFR, Estimated: >60 mL/min (ref >60)
Glucose, Bld: 102 mg/dL — ABNORMAL HIGH (ref 70–99)
Potassium: 3.7 mmol/L (ref 3.5–5.1)
Sodium: 141 mmol/L (ref 135–145)
Total Bilirubin: 0.9 mg/dL (ref 0.3–1.2)
Total Protein: 6.5 g/dL (ref 6.5–8.1)

## 2019-12-31 MED ORDER — LEUCOVORIN CALCIUM INJECTION 350 MG
300.0000 mg/m2 | Freq: Once | INTRAVENOUS | Status: AC
  Administered 2019-12-31: 606 mg via INTRAVENOUS
  Filled 2019-12-31: qty 30.3

## 2019-12-31 MED ORDER — AEROCHAMBER MV MISC: 2 refills | Status: DC

## 2019-12-31 MED ORDER — LEVALBUTEROL TARTRATE 45 MCG/ACT IN AERO: 1.0000 | INHALATION_SPRAY | Freq: Four times a day (QID) | RESPIRATORY_TRACT | 0 refills | Status: DC | PRN

## 2019-12-31 MED ORDER — DEXTROSE 5 % IV SOLN
Freq: Once | INTRAVENOUS | Status: AC
  Filled 2019-12-31: qty 250

## 2019-12-31 MED ORDER — PALONOSETRON HCL INJECTION 0.25 MG/5ML
INTRAVENOUS | Status: AC
  Filled 2019-12-31: qty 5

## 2019-12-31 MED ORDER — SODIUM CHLORIDE 0.9% FLUSH
10.0000 mL | Freq: Once | INTRAVENOUS | Status: AC
  Administered 2019-12-31: 10 mL
  Filled 2019-12-31: qty 10

## 2019-12-31 MED ORDER — SODIUM CHLORIDE 0.9 % IV SOLN
10.0000 mg | Freq: Once | INTRAVENOUS | Status: AC
  Administered 2019-12-31: 10 mg via INTRAVENOUS
  Filled 2019-12-31: qty 10

## 2019-12-31 MED ORDER — OXALIPLATIN CHEMO INJECTION 100 MG/20ML
50.0000 mg/m2 | Freq: Once | INTRAVENOUS | Status: AC
  Administered 2019-12-31: 100 mg via INTRAVENOUS
  Filled 2019-12-31: qty 20

## 2019-12-31 MED ORDER — SODIUM CHLORIDE 0.9 % IV SOLN
1800.0000 mg/m2 | INTRAVENOUS | Status: DC
  Administered 2019-12-31: 3650 mg via INTRAVENOUS
  Filled 2019-12-31: qty 73

## 2019-12-31 MED ORDER — PALONOSETRON HCL INJECTION 0.25 MG/5ML
0.2500 mg | Freq: Once | INTRAVENOUS | Status: AC
  Administered 2019-12-31: 0.25 mg via INTRAVENOUS

## 2019-12-31 NOTE — Patient Instructions (Signed)
[facility] Discharge Instructions for Patients Receiving Chemotherapy  Today you received the following chemotherapy agents: oxaliplatin, leucovorin, 5FU  To help prevent nausea and vomiting after your treatment, we encourage you to take your nausea medication as directed.    If you develop nausea and vomiting that is not controlled by your nausea medication, call the clinic.   BELOW ARE SYMPTOMS THAT SHOULD BE REPORTED IMMEDIATELY:  *FEVER GREATER THAN 100.5 F  *CHILLS WITH OR WITHOUT FEVER  NAUSEA AND VOMITING THAT IS NOT CONTROLLED WITH YOUR NAUSEA MEDICATION  *UNUSUAL SHORTNESS OF BREATH  *UNUSUAL BRUISING OR BLEEDING  TENDERNESS IN MOUTH AND THROAT WITH OR WITHOUT PRESENCE OF ULCERS  *URINARY PROBLEMS  *BOWEL PROBLEMS  UNUSUAL RASH Items with * indicate a potential emergency and should be followed up as soon as possible.  Feel free to call the clinic should you have any questions or concerns. The clinic phone number is [phone].  Please show the [facility] at check-in to the Emergency Department and triage nurse.

## 2019-12-31 NOTE — Progress Notes (Signed)
[facility] OFFICE PROGRESS NOTE   Diagnosis: Rectal cancer  INTERVAL HISTORY:   Samantha Lutz returns as scheduled.  She completed a cycle of 5-fluorouracil on 12/11/2019.  No mouth sores, nausea, or diarrhea.  She has developed an increased nonproductive cough over the past week with associated wheezing and mild dyspnea.  No fever, sore throat, or sinus drainage.  Objective:  Vital signs in last 24 hours:  Blood pressure (!) 159/78, pulse 91, temperature 98.9 F (37.2 C), temperature source Tympanic, resp. rate 17, height 5' 3" (1.6 m), weight 201 lb 6.4 oz (91.4 kg), SpO2 97 %.    HEENT: No thrush or ulcers Resp: Mild bilateral inspiratory/expiratory wheeze, inspiratory rhonchi at the right lower posterior chest, no respiratory distress Cardio: Regular rate and rhythm GI: No hepatosplenomegaly Vascular: No leg edema  Skin: Palms without erythema  Portacath/PICC-without erythema  Lab Results:  Lab Results  Component Value Date   WBC 5.4 12/31/2019   HGB 11.5 (L) 12/31/2019   HCT 33.1 (L) 12/31/2019   MCV 100.6 (H) 12/31/2019   PLT 116 (L) 12/31/2019   NEUTROABS 3.7 12/31/2019    CMP  Lab Results  Component Value Date   NA 142 12/11/2019   K 3.5 12/11/2019   CL 109 12/11/2019   CO2 24 12/11/2019   GLUCOSE 99 12/11/2019   BUN 11 12/11/2019   CREATININE 0.75 12/11/2019   CALCIUM 8.5 (L) 12/11/2019   PROT 6.1 (L) 12/11/2019   ALBUMIN 2.8 (L) 12/11/2019   AST 26 12/11/2019   ALT 16 12/11/2019   ALKPHOS 91 12/11/2019   BILITOT 1.2 12/11/2019   GFRNONAA >60 12/11/2019   GFRAA >60 10/28/2019    Lab Results  Component Value Date   CEA1 1.17 10/13/2019    Medications: I have reviewed the patient's current medications.   Assessment/Plan: 1. Rectal cancer ? Mass at 7 cm from the anal verge on colonoscopy 08/09/2017, biopsy revealed invasive adenocarcinoma ? Staging CTs 08/17/2017-no evidence of metastatic disease, asymmetric thickening in the  mid rectum ? MR pelvis 09/01/2017, T3N0 lesion beginning at 6.3 cm from the anal sphincter ? Radiation/Xeloda initiated 09/17/2017, completed 10/25/2017 ? Low anterior resection/diverting ileostomy 12/21/2017,ypT3,ypN1atumor. Lymphovascular invasion present, intact mismatch repair protein expression, treatment effect present (TRS 1). Mismatch repair protein IHC normal;Foundation 1-KRAS/NRAS wild-type, microsatellite status and tumor mutational burden could not be determined. ? Cycle 1 adjuvant Xeloda beginning 01/21/2018 ? Cycle 2 adjuvant Xeloda beginning 02/11/2018 ? Xeloda discontinued after cycle 2 secondary to patient preference ? Ileostomy takedown 07/10/2018 ? CTs 09/07/2018-multiple live small pulmonary nodules concerning for metastatic disease ? CT chest 12/10/2018-multiple bilateral lung nodules, some have increased in size ? CT chest 04/08/2019-mild enlargement of bilateral lung nodules ? Status post SBRT multiple lung nodules 05/06/2019, 05/08/2019, 05/13/2019 ? CT chest 08/28/2019-improvement and resolution in majority of right-sided pulmonary nodules, a superior segment right lower lobe nodule has increased, no new right-sided nodules. Progressive enlargement of left-sided pulmonary nodules ? PET scan 09/09/2019-hypermetabolic right lower lobe nodule, 2 hypermetabolic left upper lobe nodules with an additional 0.7 cm left upper lobe nodule below PET resolution, groundglass opacity in the mid to right lower lobe with associated hypermetabolism consistent with postradiation change, hypermetabolic central segment4Aliver lesion ? Cycle 1 FOLFOX 10/14/2019 ? Cycle  2FOLFOX 11/11/2019, 5-FU and oxaliplatin dose reduced secondary to neutropenia and thrombocytopenia following cycle one, G-CSF declined ? Cycle 3 FOLFOX 11/26/2019 ? Cycle 4 FOLFOX 12/11/2019, oxaliplatin held due to neutropenia and thrombocytopenia ? Cycle 5 FOLFOX 12/31/2019    2.Hypothyroid  3. History of mild thrombocytopenia  secondary to chemotherapy and radiation  4.Port-A-Cath placement 09/29/2019, interventional radiology  5.  Neutropenia and thrombocytopenia following cycle 1 FOLFOX-plan chemotherapy dose reductions, she declined G-CSF      Disposition: Samantha Lutz has completed 4 cycles of FOLFOX, 3 cycles contained oxaliplatin.  The CBC is adequate to proceed with cycle 5 today.  She declines G-CSF support.  Samantha Lutz has an increased cough and wheezing.  We will obtain a chest x-ray to look for evidence of disease progression and pneumonia.  The cough is most likely related to rectal cancer involving the lungs and radiation.  I have a low clinical suspicion for pneumonia.  We will refer her for a restaging chest CT if there is significant evidence of disease progression.  She will return for an office visit in 2 weeks.  We prescribed an inhaler for the cough.  We will see her sooner as indicated based on the chest x-ray result.   , MD  12/31/2019  10:07 AM   

## 2019-12-31 NOTE — Progress Notes (Signed)
Pt discharged in stable condition.

## 2019-12-31 NOTE — Progress Notes (Signed)
CXR shows changes related to radiation therapy. Ordered Xopenex HFA per Dr. Benay Spice and to take OTC Robitussin. Call end of week if cough not better.

## 2020-01-01 ENCOUNTER — Telehealth: Payer: Self-pay | Admitting: *Deleted

## 2020-01-01 ENCOUNTER — Other Ambulatory Visit: Payer: Self-pay | Admitting: *Deleted

## 2020-01-01 ENCOUNTER — Telehealth: Payer: Self-pay | Admitting: Oncology

## 2020-01-01 MED ORDER — ALBUTEROL SULFATE HFA 108 (90 BASE) MCG/ACT IN AERS: 2.0000 | INHALATION_SPRAY | Freq: Four times a day (QID) | RESPIRATORY_TRACT | 0 refills | Status: DC | PRN

## 2020-01-01 NOTE — Telephone Encounter (Signed)
Message from pharmacy that albuterol also is not covered by her insurance. Insurance prefers Proventil MDI. OK to order this per Dr. Benay Spice, although they are practically the same thing w/Proventil being brand name. New script sent to [facility].

## 2020-01-01 NOTE — Telephone Encounter (Signed)
Notified by pharmacy that insurance does not cover Xopenex. OK to change to Albuterol per Dr. Benay Spice. He is aware of her epinephrine "allergy".

## 2020-01-01 NOTE — Telephone Encounter (Signed)
Scheduled appointments per 12/1 los. Will have updated calendar printed for patient at next visit.  

## 2020-01-02 ENCOUNTER — Other Ambulatory Visit: Payer: Self-pay

## 2020-01-02 ENCOUNTER — Inpatient Hospital Stay: Payer: Medicare Other

## 2020-01-02 VITALS — BP 173/71 | HR 87 | Resp 18

## 2020-01-02 DIAGNOSIS — C20 Malignant neoplasm of rectum: Secondary | ICD-10-CM

## 2020-01-02 DIAGNOSIS — Z5111 Encounter for antineoplastic chemotherapy: Secondary | ICD-10-CM | POA: Diagnosis not present

## 2020-01-02 MED ORDER — SODIUM CHLORIDE 0.9% FLUSH
10.0000 mL | INTRAVENOUS | Status: DC | PRN
  Administered 2020-01-02: 10 mL
  Filled 2020-01-02: qty 10

## 2020-01-02 MED ORDER — HEPARIN SOD (PORK) LOCK FLUSH 100 UNIT/ML IV SOLN
500.0000 [IU] | Freq: Once | INTRAVENOUS | Status: AC | PRN
  Administered 2020-01-02: 500 [IU]
  Filled 2020-01-02: qty 5

## 2020-01-02 NOTE — Patient Instructions (Signed)

## 2020-01-02 NOTE — Progress Notes (Signed)
BP was elevated today, no other symptoms. Made MD aware and instructed patient on checking BP at home and to notify us if it doesn't drcreas.

## 2020-01-11 ENCOUNTER — Other Ambulatory Visit: Payer: Self-pay | Admitting: Oncology

## 2020-01-13 MED FILL — Dexamethasone Sodium Phosphate Inj 100 MG/10ML: INTRAMUSCULAR | Qty: 1 | Status: AC

## 2020-01-14 ENCOUNTER — Inpatient Hospital Stay: Payer: Medicare Other

## 2020-01-14 ENCOUNTER — Telehealth: Payer: Self-pay | Admitting: Nurse Practitioner

## 2020-01-14 ENCOUNTER — Other Ambulatory Visit: Payer: Self-pay

## 2020-01-14 ENCOUNTER — Inpatient Hospital Stay (HOSPITAL_BASED_OUTPATIENT_CLINIC_OR_DEPARTMENT_OTHER): Payer: Medicare Other | Admitting: Nurse Practitioner

## 2020-01-14 ENCOUNTER — Encounter: Payer: Self-pay | Admitting: Nurse Practitioner

## 2020-01-14 VITALS — BP 148/81 | HR 93 | Temp 97.8°F | Resp 15 | Ht 63.0 in | Wt 202.9 lb

## 2020-01-14 DIAGNOSIS — C20 Malignant neoplasm of rectum: Secondary | ICD-10-CM

## 2020-01-14 DIAGNOSIS — Z5111 Encounter for antineoplastic chemotherapy: Secondary | ICD-10-CM | POA: Diagnosis not present

## 2020-01-14 LAB — CMP (CANCER CENTER ONLY)
ALT: 13 U/L (ref 0–44)
AST: 21 U/L (ref 15–41)
Albumin: 2.7 g/dL — ABNORMAL LOW (ref 3.5–5.0)
Alkaline Phosphatase: 65 U/L (ref 38–126)
Anion gap: 6 (ref 5–15)
BUN: 12 mg/dL (ref 8–23)
CO2: 24 mmol/L (ref 22–32)
Calcium: 8.7 mg/dL — ABNORMAL LOW (ref 8.9–10.3)
Chloride: 110 mmol/L (ref 98–111)
Creatinine: 0.78 mg/dL (ref 0.44–1.00)
GFR, Estimated: >60 mL/min (ref >60)
Glucose, Bld: 107 mg/dL — ABNORMAL HIGH (ref 70–99)
Potassium: 3.4 mmol/L — ABNORMAL LOW (ref 3.5–5.1)
Sodium: 140 mmol/L (ref 135–145)
Total Bilirubin: 0.9 mg/dL (ref 0.3–1.2)
Total Protein: 6.2 g/dL — ABNORMAL LOW (ref 6.5–8.1)

## 2020-01-14 LAB — CBC WITH DIFFERENTIAL (CANCER CENTER ONLY)
Abs Immature Granulocytes: 0.01 10*3/uL (ref 0.00–0.07)
Basophils Absolute: 0 10*3/uL (ref 0.0–0.1)
Basophils Relative: 0 %
Eosinophils Absolute: 0.2 10*3/uL (ref 0.0–0.5)
Eosinophils Relative: 6 %
HCT: 30.5 % — ABNORMAL LOW (ref 36.0–46.0)
Hemoglobin: 10.5 g/dL — ABNORMAL LOW (ref 12.0–15.0)
Immature Granulocytes: 0 %
Lymphocytes Relative: 18 %
Lymphs Abs: 0.5 10*3/uL — ABNORMAL LOW (ref 0.7–4.0)
MCH: 35.1 pg — ABNORMAL HIGH (ref 26.0–34.0)
MCHC: 34.4 g/dL (ref 30.0–36.0)
MCV: 102 fL — ABNORMAL HIGH (ref 80.0–100.0)
Monocytes Absolute: 0.6 10*3/uL (ref 0.1–1.0)
Monocytes Relative: 23 %
Neutro Abs: 1.4 10*3/uL — ABNORMAL LOW (ref 1.7–7.7)
Neutrophils Relative %: 53 %
Platelet Count: 83 10*3/uL — ABNORMAL LOW (ref 150–400)
RBC: 2.99 MIL/uL — ABNORMAL LOW (ref 3.87–5.11)
RDW: 14.6 % (ref 11.5–15.5)
WBC Count: 2.6 10*3/uL — ABNORMAL LOW (ref 4.0–10.5)
nRBC: 0 % (ref 0.0–0.2)

## 2020-01-14 MED ORDER — SODIUM CHLORIDE 0.9 % IV SOLN
1400.0000 mg/m2 | INTRAVENOUS | Status: DC
  Administered 2020-01-14: 2850 mg via INTRAVENOUS
  Filled 2020-01-14: qty 57

## 2020-01-14 NOTE — Progress Notes (Signed)
[place] OFFICE PROGRESS NOTE   Diagnosis: Rectal cancer  INTERVAL HISTORY:   Samantha Lutz returns as scheduled.  She completed cycle 5 FOLFOX 12/31/2019.  She denies nausea/vomiting.  Her mouth became very sore on day 5 affecting her ability to eat and drink.  This lasted for about 2 days.  No diarrhea.  No hand or foot pain or redness.  She notes persistent mild cold sensitivity.  Wheezing has resolved.  She continues to cough with exertion but notes that the inhaler has helped significantly.  Objective:  Vital signs in last 24 hours:  Blood pressure (!) 148/81, pulse 93, temperature 97.8 F (36.6 C), temperature source Tympanic, resp. rate 15, height '5\' 3"'  (1.6 m), weight 202 lb 14.4 oz (92 kg), SpO2 98 %.    HEENT: No thrush or ulcers. Resp: Lungs clear bilaterally. Cardio: Regular rate and rhythm. GI: Abdomen soft and nontender.  No hepatomegaly. Vascular: No leg edema.  Skin: Palms without erythema. Port-A-Cath without erythema.   Lab Results:  Lab Results  Component Value Date   WBC 2.6 (L) 01/14/2020   HGB 10.5 (L) 01/14/2020   HCT 30.5 (L) 01/14/2020   MCV 102.0 (H) 01/14/2020   PLT 83 (L) 01/14/2020   NEUTROABS 1.4 (L) 01/14/2020    Imaging:  No results found.  Medications: I have reviewed the patient's current medications.  Assessment/Plan: 1. Rectal cancer ? Mass at 7 cm from the anal verge on colonoscopy 08/09/2017, biopsy revealed invasive adenocarcinoma ? Staging CTs 08/17/2017-no evidence of metastatic disease, asymmetric thickening in the mid rectum ? MR pelvis 09/01/2017, T3N0 lesion beginning at 6.3 cm from the anal sphincter ? Radiation/Xeloda initiated 09/17/2017, completed 10/25/2017 ? Low anterior resection/diverting ileostomy 12/21/2017,ypT3,ypN1atumor. Lymphovascular invasion present, intact mismatch repair protein expression, treatment effect present (TRS 1). Mismatch repair protein IHC normal;Foundation 1-KRAS/NRAS  wild-type, microsatellite status and tumor mutational burden could not be determined. ? Cycle 1 adjuvant Xeloda beginning 01/21/2018 ? Cycle 2 adjuvant Xeloda beginning 02/11/2018 ? Xeloda discontinued after cycle 2 secondary to patient preference ? Ileostomy takedown 07/10/2018 ? CTs 09/07/2018-multiple live small pulmonary nodules concerning for metastatic disease ? CT chest 12/10/2018-multiple bilateral lung nodules, some have increased in size ? CT chest 04/08/2019-mild enlargement of bilateral lung nodules ? Status post SBRT multiple lung nodules 05/06/2019, 05/08/2019, 05/13/2019 ? CT chest 08/28/2019-improvement and resolution in majority of right-sided pulmonary nodules, a superior segment right lower lobe nodule has increased, no new right-sided nodules. Progressive enlargement of left-sided pulmonary nodules ? PET scan 09/09/2019-hypermetabolic right lower lobe nodule, 2 hypermetabolic left upper lobe nodules with an additional 0.7 cm left upper lobe nodule below PET resolution, groundglass opacity in the mid to right lower lobe with associated hypermetabolism consistent with postradiation change, hypermetabolic central [id number] lesion ? Cycle 1 FOLFOX 10/14/2019 ? Cycle 2FOLFOX 11/11/2019, 5-FU and oxaliplatin dose reduced secondary to neutropenia and thrombocytopenia following cycle one, G-CSF declined ? Cycle 3 FOLFOX 11/26/2019 ? Cycle 4 FOLFOX 12/11/2019, oxaliplatin held due to neutropenia and thrombocytopenia ? Cycle 5 FOLFOX 12/31/2019 ? Cycle 6 FOLFOX 01/14/2020 (oxaliplatin held, 5-fluorouracil dose reduced due to mucositis)  2.Hypothyroid  3. 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 Neutropenia and thrombocytopenia following cycle 1 FOLFOX-plan chemotherapy dose reductions, she declined G-CSF  6.  Mucositis following cycle 5 FOLFOX, 5-fluorouracil dose reduced with  cycle 6    Disposition: Samantha Lutz appears stable.  She has completed 5 cycles of FOLFOX.  Plan to proceed with cycle 6 today as scheduled, oxaliplatin will be held due to neutropenia/thrombocytopenia and 5-fluorouracil dose reduced due to mucositis.  We reviewed neutropenic and bleeding precautions.  She understands to contact the office with fever, chills, other signs of infection, bleeding.  Restaging chest CT prior to her next visit.  She will return for lab, follow-up, FOLFOX in 2 weeks, chest CT a few days prior.  She will contact the office prior to her next visit as outlined above or with any other problems.  Plan reviewed with Dr. Benay Spice by telephone.   Ned Card ANP/GNP-BC   01/14/2020  10:39 AM

## 2020-01-14 NOTE — Telephone Encounter (Signed)
Scheduled appointments per 12/15 los. RN [place] printed out updated calendar for patient while she was in infusion.

## 2020-01-14 NOTE — Patient Instructions (Signed)
[facility] Discharge Instructions for Patients Receiving Chemotherapy  Today you received the following chemotherapy agents Flourouracil (ADRUCIL) Pump.  To help prevent nausea and vomiting after your treatment, we encourage you to take your nausea medication as prescribed.   If you develop nausea and vomiting that is not controlled by your nausea medication, call the clinic.   BELOW ARE SYMPTOMS THAT SHOULD BE REPORTED IMMEDIATELY:  *FEVER GREATER THAN 100.5 F  *CHILLS WITH OR WITHOUT FEVER  NAUSEA AND VOMITING THAT IS NOT CONTROLLED WITH YOUR NAUSEA MEDICATION  *UNUSUAL SHORTNESS OF BREATH  *UNUSUAL BRUISING OR BLEEDING  TENDERNESS IN MOUTH AND THROAT WITH OR WITHOUT PRESENCE OF ULCERS  *URINARY PROBLEMS  *BOWEL PROBLEMS  UNUSUAL RASH Items with * indicate a potential emergency and should be followed up as soon as possible.  Feel free to call the clinic should you have any questions or concerns. The clinic phone number is [phone].  Please show the CHEMO ALERT CARD at check-in to the Emergency Department and triage nurse.   

## 2020-01-16 ENCOUNTER — Other Ambulatory Visit: Payer: Self-pay

## 2020-01-16 ENCOUNTER — Inpatient Hospital Stay: Payer: Medicare Other

## 2020-01-16 VITALS — BP 159/77 | HR 97

## 2020-01-16 DIAGNOSIS — C20 Malignant neoplasm of rectum: Secondary | ICD-10-CM

## 2020-01-16 DIAGNOSIS — Z5111 Encounter for antineoplastic chemotherapy: Secondary | ICD-10-CM | POA: Diagnosis not present

## 2020-01-16 MED ORDER — HEPARIN SOD (PORK) LOCK FLUSH 100 UNIT/ML IV SOLN
500.0000 [IU] | Freq: Once | INTRAVENOUS | Status: AC | PRN
  Administered 2020-01-16: 500 [IU]
  Filled 2020-01-16: qty 5

## 2020-01-16 MED ORDER — SODIUM CHLORIDE 0.9% FLUSH
10.0000 mL | INTRAVENOUS | Status: DC | PRN
  Administered 2020-01-16: 10 mL
  Filled 2020-01-16: qty 10

## 2020-01-25 ENCOUNTER — Other Ambulatory Visit: Payer: Self-pay | Admitting: Oncology

## 2020-01-27 ENCOUNTER — Ambulatory Visit (HOSPITAL_COMMUNITY)
Admission: RE | Admit: 2020-01-27 | Discharge: 2020-01-27 | Disposition: A | Discharge status: Home / Self Care | Payer: Medicare Other | Source: Ambulatory Visit | Attending: Nurse Practitioner | Admitting: Nurse Practitioner

## 2020-01-27 ENCOUNTER — Other Ambulatory Visit: Payer: Self-pay

## 2020-01-27 DIAGNOSIS — C20 Malignant neoplasm of rectum: Secondary | ICD-10-CM

## 2020-01-28 ENCOUNTER — Inpatient Hospital Stay (HOSPITAL_BASED_OUTPATIENT_CLINIC_OR_DEPARTMENT_OTHER): Payer: Medicare Other | Admitting: Oncology

## 2020-01-28 ENCOUNTER — Inpatient Hospital Stay: Payer: Medicare Other

## 2020-01-28 ENCOUNTER — Other Ambulatory Visit: Payer: Self-pay

## 2020-01-28 VITALS — BP 150/81 | HR 91 | Temp 97.6°F | Resp 18 | Ht 63.0 in | Wt 198.6 lb

## 2020-01-28 DIAGNOSIS — Z5111 Encounter for antineoplastic chemotherapy: Secondary | ICD-10-CM | POA: Diagnosis not present

## 2020-01-28 DIAGNOSIS — Z95828 Presence of other vascular implants and grafts: Secondary | ICD-10-CM

## 2020-01-28 DIAGNOSIS — C20 Malignant neoplasm of rectum: Secondary | ICD-10-CM

## 2020-01-28 LAB — CMP (CANCER CENTER ONLY)
ALT: 9 U/L (ref 0–44)
AST: 18 U/L (ref 15–41)
Albumin: 2.7 g/dL — ABNORMAL LOW (ref 3.5–5.0)
Alkaline Phosphatase: 54 U/L (ref 38–126)
Anion gap: 5 (ref 5–15)
BUN: 10 mg/dL (ref 8–23)
CO2: 25 mmol/L (ref 22–32)
Calcium: 8.6 mg/dL — ABNORMAL LOW (ref 8.9–10.3)
Chloride: 112 mmol/L — ABNORMAL HIGH (ref 98–111)
Creatinine: 0.75 mg/dL (ref 0.44–1.00)
GFR, Estimated: >60 mL/min (ref >60)
Glucose, Bld: 117 mg/dL — ABNORMAL HIGH (ref 70–99)
Potassium: 3.5 mmol/L (ref 3.5–5.1)
Sodium: 142 mmol/L (ref 135–145)
Total Bilirubin: 0.7 mg/dL (ref 0.3–1.2)
Total Protein: 6.3 g/dL — ABNORMAL LOW (ref 6.5–8.1)

## 2020-01-28 LAB — CBC WITH DIFFERENTIAL (CANCER CENTER ONLY)
Abs Immature Granulocytes: 0.02 10*3/uL (ref 0.00–0.07)
Basophils Absolute: 0 10*3/uL (ref 0.0–0.1)
Basophils Relative: 1 %
Eosinophils Absolute: 0.3 10*3/uL (ref 0.0–0.5)
Eosinophils Relative: 7 %
HCT: 31.6 % — ABNORMAL LOW (ref 36.0–46.0)
Hemoglobin: 10.8 g/dL — ABNORMAL LOW (ref 12.0–15.0)
Immature Granulocytes: 1 %
Lymphocytes Relative: 12 %
Lymphs Abs: 0.5 10*3/uL — ABNORMAL LOW (ref 0.7–4.0)
MCH: 34.3 pg — ABNORMAL HIGH (ref 26.0–34.0)
MCHC: 34.2 g/dL (ref 30.0–36.0)
MCV: 100.3 fL — ABNORMAL HIGH (ref 80.0–100.0)
Monocytes Absolute: 0.7 10*3/uL (ref 0.1–1.0)
Monocytes Relative: 16 %
Neutro Abs: 2.7 10*3/uL (ref 1.7–7.7)
Neutrophils Relative %: 63 %
Platelet Count: 96 10*3/uL — ABNORMAL LOW (ref 150–400)
RBC: 3.15 MIL/uL — ABNORMAL LOW (ref 3.87–5.11)
RDW: 14.5 % (ref 11.5–15.5)
WBC Count: 4.2 10*3/uL (ref 4.0–10.5)
nRBC: 0 % (ref 0.0–0.2)

## 2020-01-28 MED ORDER — PALONOSETRON HCL INJECTION 0.25 MG/5ML
INTRAVENOUS | Status: AC
  Filled 2020-01-28: qty 5

## 2020-01-28 MED ORDER — PALONOSETRON HCL INJECTION 0.25 MG/5ML
0.2500 mg | Freq: Once | INTRAVENOUS | Status: AC
  Administered 2020-01-28: 0.25 mg via INTRAVENOUS

## 2020-01-28 MED ORDER — SODIUM CHLORIDE 0.9% FLUSH
10.0000 mL | Freq: Once | INTRAVENOUS | Status: AC
  Administered 2020-01-28: 10 mL
  Filled 2020-01-28: qty 10

## 2020-01-28 MED ORDER — DEXTROSE 5 % IV SOLN
Freq: Once | INTRAVENOUS | Status: AC
Start: 2020-01-28 — End: 2020-01-28
  Filled 2020-01-28: qty 250

## 2020-01-28 MED ORDER — SODIUM CHLORIDE 0.9 % IV SOLN
10.0000 mg | Freq: Once | INTRAVENOUS | Status: AC
  Administered 2020-01-28: 10 mg via INTRAVENOUS
  Filled 2020-01-28: qty 10

## 2020-01-28 MED ORDER — LEUCOVORIN CALCIUM INJECTION 350 MG
300.0000 mg/m2 | Freq: Once | INTRAVENOUS | Status: AC
  Administered 2020-01-28: 606 mg via INTRAVENOUS
  Filled 2020-01-28: qty 30.3

## 2020-01-28 MED ORDER — OXALIPLATIN CHEMO INJECTION 100 MG/20ML
50.0000 mg/m2 | Freq: Once | INTRAVENOUS | Status: AC
  Administered 2020-01-28: 100 mg via INTRAVENOUS
  Filled 2020-01-28: qty 20

## 2020-01-28 MED ORDER — HEPARIN SOD (PORK) LOCK FLUSH 100 UNIT/ML IV SOLN
500.0000 [IU] | Freq: Once | INTRAVENOUS | Status: DC
  Filled 2020-01-28: qty 5

## 2020-01-28 MED ORDER — PREDNISONE 10 MG PO TABS: 10.0000 mg | ORAL_TABLET | ORAL | 0 refills | Status: DC

## 2020-01-28 MED ORDER — SODIUM CHLORIDE 0.9 % IV SOLN
1400.0000 mg/m2 | INTRAVENOUS | Status: DC
  Administered 2020-01-28: 2850 mg via INTRAVENOUS
  Filled 2020-01-28: qty 57

## 2020-01-28 NOTE — Patient Instructions (Signed)
[facility] Discharge Instructions for Patients Receiving Chemotherapy  Today you received the following chemotherapy agents: Oxaliplatin/Leucovorin/5FU  To help prevent nausea and vomiting after your treatment, we encourage you to take your nausea medication as directed.   If you develop nausea and vomiting that is not controlled by your nausea medication, call the clinic.   BELOW ARE SYMPTOMS THAT SHOULD BE REPORTED IMMEDIATELY:  *FEVER GREATER THAN 100.5 F  *CHILLS WITH OR WITHOUT FEVER  NAUSEA AND VOMITING THAT IS NOT CONTROLLED WITH YOUR NAUSEA MEDICATION  *UNUSUAL SHORTNESS OF BREATH  *UNUSUAL BRUISING OR BLEEDING  TENDERNESS IN MOUTH AND THROAT WITH OR WITHOUT PRESENCE OF ULCERS  *URINARY PROBLEMS  *BOWEL PROBLEMS  UNUSUAL RASH Items with * indicate a potential emergency and should be followed up as soon as possible.  Feel free to call the clinic should you have any questions or concerns. The clinic phone number is [phone].  Please show the CHEMO ALERT CARD at check-in to the Emergency Department and triage nurse.   

## 2020-01-28 NOTE — Progress Notes (Signed)
[place] OFFICE PROGRESS NOTE   Diagnosis: Rectal cancer  INTERVAL HISTORY:   Samantha Lutz returns as scheduled.  She completed a cycle of 5-fluorouracil on 01/14/2020.  No mouth sores following this cycle of chemotherapy.  No neuropathy symptoms at present.  She has malaise.  She continues to have a cough that is worse with exertion.  She has mild exertional dyspnea.  No fever.  The cough is nonproductive.  The albuterol inhaler helps the dyspnea.  Objective:  Vital signs in last 24 hours:  Blood pressure (!) 150/81, pulse 91, temperature 97.6 F (36.4 C), temperature source Tympanic, resp. rate 18, height '5\' 3"'  (1.6 m), weight 198 lb 9.6 oz (90.1 kg), SpO2 96 %.    HEENT: No thrush or ulcers Resp: Decreased breath sounds and end inspiratory rhonchi throughout the right posterior and upper anterior chest, no respiratory distress Cardio: Regular rate and rhythm  GI: No hepatomegaly Vascular: No leg edema  Skin: No rash  Portacath/PICC-without erythema  Lab Results:  Lab Results  Component Value Date   WBC 4.2 01/28/2020   HGB 10.8 (L) 01/28/2020   HCT 31.6 (L) 01/28/2020   MCV 100.3 (H) 01/28/2020   PLT 96 (L) 01/28/2020   NEUTROABS 2.7 01/28/2020    CMP  Lab Results  Component Value Date   NA 140 01/14/2020   K 3.4 (L) 01/14/2020   CL 110 01/14/2020   CO2 24 01/14/2020   GLUCOSE 107 (H) 01/14/2020   BUN 12 01/14/2020   CREATININE 0.78 01/14/2020   CALCIUM 8.7 (L) 01/14/2020   PROT 6.2 (L) 01/14/2020   ALBUMIN 2.7 (L) 01/14/2020   AST 21 01/14/2020   ALT 13 01/14/2020   ALKPHOS 65 01/14/2020   BILITOT 0.9 01/14/2020   GFRNONAA >60 01/14/2020   GFRAA >60 10/28/2019    Lab Results  Component Value Date   CEA1 1.17 10/13/2019    No results found for: INR  Imaging:  CT Chest Wo Contrast  Result Date: 01/27/2020 CLINICAL DATA:  Metastatic rectal cancer to the lungs. Assess treatment response. Undergoing chemotherapy. EXAM: CT  CHEST WITHOUT CONTRAST TECHNIQUE: Multidetector CT imaging of the chest was performed following the standard protocol without IV contrast. COMPARISON:  CT chest 08/27/2019 and 04/08/2019.  PET-CT 09/09/2019. FINDINGS: Cardiovascular: Mild atherosclerosis of the aorta, great vessels and coronary arteries. Right IJ Port-A-Cath extends to the level of the inferior right atrium. The heart size is normal. There is no pericardial effusion. Mediastinum/Nodes: Small right paratracheal lymph nodes have mildly enlarged compared with the most recent CT, but were not significantly hypermetabolic on prior PET-CT. These include an 8 mm short axis node on image 34/2, a 10 mm node on image 41/2 and a 9 mm node on image 47/2. No other enlarged mediastinal, hilar or axillary lymph nodes are identified. The right hilum is partly obscured by adjacent parenchymal opacity. There is a small hiatal hernia. Lungs/Pleura: New small right pleural effusion. No left pleural effusion or pneumothorax. There is progressive airspace disease with volume loss and traction bronchiectasis throughout the right lung which likely relates to radiation therapy. The previously demonstrated right lung nodules are largely obscured. There is new ill-defined airspace opacity medially in the left lower lobe adjacent to the descending aorta, measuring up to 2.9 x 1.2 cm on image 92/5, possibly related to radiation therapy as well. The previously demonstrated small left lung nodules appear mildly improved. For example, left upper lobe nodule measuring 5 mm on image 57/5 previously  measured 6 mm. A central lingular nodule measuring approximately 8 mm on image 67/5 previously measured 11 mm. A posterior lingular nodule measuring 5 mm on image 74/5 previously measured 6 mm. No new or enlarging nodules are identified. Upper abdomen: The visualized upper abdomen appears unchanged. No focal hepatic lesions are identified on noncontrast imaging. There are multiple small  calcified gallstones. Musculoskeletal/Chest wall: There is no chest wall mass or suspicious osseous finding. IMPRESSION: 1. Interval improvement in small left lung nodules consistent with response to therapy. 2. Progressive airspace disease with volume loss and traction bronchiectasis throughout the right lung, likely related to radiation therapy. The previously demonstrated right lung nodules are largely obscured. No definite disease progression identified. 3. Mildly enlarged right paratracheal lymph nodes, not significantly hypermetabolic on prior PET-CT, probably reactive. 4. Cholelithiasis. 5. Aortic Atherosclerosis (ICD10-I70.0). Electronically Signed   By: Richardean Sale M.D.   On: 01/27/2020 11:35    Medications: I have reviewed the patient's current medications.   Assessment/Plan: 1. Rectal cancer ? Mass at 7 cm from the anal verge on colonoscopy 08/09/2017, biopsy revealed invasive adenocarcinoma ? Staging CTs 08/17/2017-no evidence of metastatic disease, asymmetric thickening in the mid rectum ? MR pelvis 09/01/2017, T3N0 lesion beginning at 6.3 cm from the anal sphincter ? Radiation/Xeloda initiated 09/17/2017, completed 10/25/2017 ? Low anterior resection/diverting ileostomy 12/21/2017,ypT3,ypN1atumor. Lymphovascular invasion present, intact mismatch repair protein expression, treatment effect present (TRS 1). Mismatch repair protein IHC normal;Foundation 1-KRAS/NRAS wild-type, microsatellite status and tumor mutational burden could not be determined. ? Cycle 1 adjuvant Xeloda beginning 01/21/2018 ? Cycle 2 adjuvant Xeloda beginning 02/11/2018 ? Xeloda discontinued after cycle 2 secondary to patient preference ? Ileostomy takedown 07/10/2018 ? CTs 09/07/2018-multiple live small pulmonary nodules concerning for metastatic disease ? CT chest 12/10/2018-multiple bilateral lung nodules, some have increased in size ? CT chest 04/08/2019-mild enlargement of bilateral lung nodules ? Status post  SBRT multiple lung nodules 05/06/2019, 05/08/2019, 05/13/2019 ? CT chest 08/28/2019-improvement and resolution in majority of right-sided pulmonary nodules, a superior segment right lower lobe nodule has increased, no new right-sided nodules. Progressive enlargement of left-sided pulmonary nodules ? PET scan 09/09/2019-hypermetabolic right lower lobe nodule, 2 hypermetabolic left upper lobe nodules with an additional 0.7 cm left upper lobe nodule below PET resolution, groundglass opacity in the mid to right lower lobe with associated hypermetabolism consistent with postradiation change, hypermetabolic central [id number] lesion ? Cycle 1 FOLFOX 10/14/2019 ? Cycle 2FOLFOX 11/11/2019, 5-FU and oxaliplatin dose reduced secondary to neutropenia and thrombocytopenia following cycle one, G-CSF declined ? Cycle 3 FOLFOX 11/26/2019 ? Cycle 4 FOLFOX 12/11/2019, oxaliplatin held due to neutropenia and thrombocytopenia ? Cycle 5 FOLFOX 12/31/2019 ? Cycle 6 FOLFOX 01/14/2020 (oxaliplatin held, 5-fluorouracil dose reduced due to mucositis) ? CT chest 01/27/2020-improvement in left lung nodules, progressive airspace disease with traction bronchiectasis throughout the right lung, previously noted right lung nodules are obscured, mildly enlarged right paratracheal lymph nodes-not hypermetabolic on prior PET, likely reactive ? Cycle 7 FOLFOX 01/28/2020  2.Hypothyroid  3. 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 Neutropenia and thrombocytopenia following cycle 1 FOLFOX-plan chemotherapy dose reductions, she declined G-CSF  6.  Mucositis following cycle 5 FOLFOX, 5-fluorouracil dose reduced with cycle 6  7.  Right lung airspace disease/volume loss-likely toxicity from chest radiation, trial of prednisone 01/29/2020     Disposition: Samantha Lutz appears unchanged.  She has metastatic rectal cancer.   She has completed 6 cycles of systemic therapy (4 cycles  included oxaliplatin) and the measurable disease in the left lung has improved.  She will complete another cycle of FOLFOX today.  I suspect the right lung consolidation and symptoms are related to radiation toxicity.  I will refer her to Dr. Lisbeth Renshaw.  She will begin a trial of prednisone.  I have a low clinical suspicion for infection given the chronicity of her symptoms and lack of fever.  Samantha Lutz declines a COVID-19 vaccine.  She will return for an office visit and chemotherapy in 2 weeks.  I will present her case at the GI tumor conference to review the restaging CT, including evaluation of the hypermetabolic segment 4 liver lesion.  Betsy Coder, MD  01/28/2020  9:26 AM

## 2020-01-28 NOTE — Progress Notes (Signed)
Per Dr. Kalman Drape office visit note, patient will receive FOLFOX today with mild thrombocytopenia.

## 2020-01-29 ENCOUNTER — Telehealth: Payer: Self-pay | Admitting: Oncology

## 2020-01-29 NOTE — Telephone Encounter (Signed)
Scheduled appointments per 12/29 los. Spoke to patient who is aware of appointments dates and times.  

## 2020-01-30 ENCOUNTER — Other Ambulatory Visit: Payer: Self-pay

## 2020-01-30 ENCOUNTER — Inpatient Hospital Stay: Payer: Medicare Other

## 2020-01-30 VITALS — BP 142/67 | HR 82 | Temp 97.0°F | Resp 20

## 2020-01-30 DIAGNOSIS — C20 Malignant neoplasm of rectum: Secondary | ICD-10-CM

## 2020-01-30 DIAGNOSIS — Z5111 Encounter for antineoplastic chemotherapy: Secondary | ICD-10-CM | POA: Diagnosis not present

## 2020-01-30 MED ORDER — SODIUM CHLORIDE 0.9% FLUSH
10.0000 mL | INTRAVENOUS | Status: DC | PRN
  Administered 2020-01-30: 10 mL
  Filled 2020-01-30: qty 10

## 2020-01-30 MED ORDER — HEPARIN SOD (PORK) LOCK FLUSH 100 UNIT/ML IV SOLN
500.0000 [IU] | Freq: Once | INTRAVENOUS | Status: AC | PRN
  Administered 2020-01-30: 500 [IU]
  Filled 2020-01-30: qty 5

## 2020-02-04 ENCOUNTER — Other Ambulatory Visit: Payer: Self-pay

## 2020-02-04 NOTE — Progress Notes (Signed)
The proposed treatment discussed in conference is for discussion purposes only and is not a binding recommendation.  The patients have not been physically examined, or presented with their treatment options.  Therefore, final treatment plans cannot be decided.   

## 2020-02-08 ENCOUNTER — Other Ambulatory Visit: Payer: Self-pay | Admitting: Oncology

## 2020-02-10 ENCOUNTER — Other Ambulatory Visit: Payer: Medicare Other

## 2020-02-10 ENCOUNTER — Ambulatory Visit: Payer: Medicare Other | Admitting: Nurse Practitioner

## 2020-02-10 ENCOUNTER — Ambulatory Visit: Payer: Medicare Other

## 2020-02-10 MED FILL — Dexamethasone Sodium Phosphate Inj 100 MG/10ML: INTRAMUSCULAR | Qty: 1 | Status: AC

## 2020-02-11 ENCOUNTER — Inpatient Hospital Stay: Payer: Medicare Other

## 2020-02-11 ENCOUNTER — Other Ambulatory Visit: Payer: Self-pay

## 2020-02-11 ENCOUNTER — Inpatient Hospital Stay: Payer: Medicare Other | Attending: Oncology

## 2020-02-11 ENCOUNTER — Inpatient Hospital Stay (HOSPITAL_BASED_OUTPATIENT_CLINIC_OR_DEPARTMENT_OTHER): Payer: Medicare Other | Admitting: Nurse Practitioner

## 2020-02-11 ENCOUNTER — Encounter: Payer: Self-pay | Admitting: Nurse Practitioner

## 2020-02-11 VITALS — BP 154/88 | HR 89 | Temp 97.0°F | Resp 17 | Ht 63.0 in | Wt 200.3 lb

## 2020-02-11 DIAGNOSIS — Z5111 Encounter for antineoplastic chemotherapy: Secondary | ICD-10-CM | POA: Insufficient documentation

## 2020-02-11 DIAGNOSIS — Z923 Personal history of irradiation: Secondary | ICD-10-CM | POA: Insufficient documentation

## 2020-02-11 DIAGNOSIS — E039 Hypothyroidism, unspecified: Secondary | ICD-10-CM | POA: Diagnosis not present

## 2020-02-11 DIAGNOSIS — C20 Malignant neoplasm of rectum: Secondary | ICD-10-CM

## 2020-02-11 DIAGNOSIS — D696 Thrombocytopenia, unspecified: Secondary | ICD-10-CM | POA: Insufficient documentation

## 2020-02-11 DIAGNOSIS — D709 Neutropenia, unspecified: Secondary | ICD-10-CM | POA: Diagnosis not present

## 2020-02-11 DIAGNOSIS — Z95828 Presence of other vascular implants and grafts: Secondary | ICD-10-CM

## 2020-02-11 LAB — CMP (CANCER CENTER ONLY)
ALT: 20 U/L (ref 0–44)
AST: 20 U/L (ref 15–41)
Albumin: 3 g/dL — ABNORMAL LOW (ref 3.5–5.0)
Alkaline Phosphatase: 55 U/L (ref 38–126)
Anion gap: 8 (ref 5–15)
BUN: 23 mg/dL (ref 8–23)
CO2: 25 mmol/L (ref 22–32)
Calcium: 8.7 mg/dL — ABNORMAL LOW (ref 8.9–10.3)
Chloride: 107 mmol/L (ref 98–111)
Creatinine: 0.83 mg/dL (ref 0.44–1.00)
GFR, Estimated: >60 mL/min (ref >60)
Glucose, Bld: 92 mg/dL (ref 70–99)
Potassium: 3.9 mmol/L (ref 3.5–5.1)
Sodium: 140 mmol/L (ref 135–145)
Total Bilirubin: 0.7 mg/dL (ref 0.3–1.2)
Total Protein: 6.3 g/dL — ABNORMAL LOW (ref 6.5–8.1)

## 2020-02-11 LAB — CBC WITH DIFFERENTIAL (CANCER CENTER ONLY)
Abs Immature Granulocytes: 0.02 10*3/uL (ref 0.00–0.07)
Basophils Absolute: 0 10*3/uL (ref 0.0–0.1)
Basophils Relative: 0 %
Eosinophils Absolute: 0 10*3/uL (ref 0.0–0.5)
Eosinophils Relative: 1 %
HCT: 34.3 % — ABNORMAL LOW (ref 36.0–46.0)
Hemoglobin: 11.6 g/dL — ABNORMAL LOW (ref 12.0–15.0)
Immature Granulocytes: 1 %
Lymphocytes Relative: 20 %
Lymphs Abs: 0.6 10*3/uL — ABNORMAL LOW (ref 0.7–4.0)
MCH: 34.7 pg — ABNORMAL HIGH (ref 26.0–34.0)
MCHC: 33.8 g/dL (ref 30.0–36.0)
MCV: 102.7 fL — ABNORMAL HIGH (ref 80.0–100.0)
Monocytes Absolute: 0.6 10*3/uL (ref 0.1–1.0)
Monocytes Relative: 22 %
Neutro Abs: 1.6 10*3/uL — ABNORMAL LOW (ref 1.7–7.7)
Neutrophils Relative %: 56 %
Platelet Count: 65 10*3/uL — ABNORMAL LOW (ref 150–400)
RBC: 3.34 MIL/uL — ABNORMAL LOW (ref 3.87–5.11)
RDW: 14.7 % (ref 11.5–15.5)
WBC Count: 2.8 10*3/uL — ABNORMAL LOW (ref 4.0–10.5)
nRBC: 0 % (ref 0.0–0.2)

## 2020-02-11 MED ORDER — SODIUM CHLORIDE 0.9% FLUSH
10.0000 mL | Freq: Once | INTRAVENOUS | Status: AC
  Administered 2020-02-11: 10 mL
  Filled 2020-02-11: qty 10

## 2020-02-11 MED ORDER — SODIUM CHLORIDE 0.9 % IV SOLN
1400.0000 mg/m2 | INTRAVENOUS | Status: DC
  Administered 2020-02-11: 2850 mg via INTRAVENOUS
  Filled 2020-02-11: qty 57

## 2020-02-11 NOTE — Progress Notes (Signed)
[place] OFFICE PROGRESS NOTE   Diagnosis: Rectal cancer  INTERVAL HISTORY:   Samantha Lutz returns as scheduled.  She completed cycle 7 FOLFOX 01/28/2020.  She denies nausea/vomiting.  She had a few mild mouth sores, now resolved.  She was able to eat and drink without difficulty.  She has noted some "gaps" between her teeth that she has not noticed previously.  Teeth do not feel loose.  No diarrhea.  She has mild persistent cold sensitivity in the fingertips.  Since beginning the prednisone taper she notes less dyspnea and marked improvement in her cough.  She is utilizing the inhaler less.  Objective:  Vital signs in last 24 hours:  Blood pressure (!) 154/88, pulse 89, temperature (!) 97 F (36.1 C), temperature source Tympanic, resp. rate 17, height _0  (1.6 m), weight 200 lb 4.8 oz (90.9 kg), SpO2 98 %.    HEENT: No thrush or ulcers. Resp: Faint expiratory wheezes right lung field.  Lungs otherwise clear with good air movement.  No respiratory distress. Cardio: Regular rate and rhythm. GI: Abdomen soft and nontender.  No hepatomegaly. Vascular: No leg edema. Neuro: Vibratory sense intact over the fingertips per tuning fork exam. Skin: Palms without erythema. Port-A-Cath without erythema.   Lab Results:  Lab Results  Component Value Date   WBC 2.8 (L) 02/11/2020   HGB 11.6 (L) 02/11/2020   HCT 34.3 (L) 02/11/2020   MCV 102.7 (H) 02/11/2020   PLT 65 (L) 02/11/2020   NEUTROABS 1.6 (L) 02/11/2020    Imaging:  No results found.  Medications: I have reviewed the patient's current medications.  Assessment/Plan: 1. Rectal cancer ? Mass at 7 cm from the anal verge on colonoscopy 08/09/2017, biopsy revealed invasive adenocarcinoma ? Staging CTs 08/17/2017-no evidence of metastatic disease, asymmetric thickening in the mid rectum ? MR pelvis 09/01/2017, T3N0 lesion beginning at 6.3 cm from the anal sphincter ? Radiation/Xeloda initiated 09/17/2017,  completed 10/25/2017 ? Low anterior resection/diverting ileostomy 12/21/2017,ypT3,ypN1atumor. Lymphovascular invasion present, intact mismatch repair protein expression, treatment effect present (TRS 1). Mismatch repair protein IHC normal;Foundation 1-KRAS/NRAS wild-type, microsatellite status and tumor mutational burden could not be determined. ? Cycle 1 adjuvant Xeloda beginning 01/21/2018 ? Cycle 2 adjuvant Xeloda beginning 02/11/2018 ? Xeloda discontinued after cycle 2 secondary to patient preference ? Ileostomy takedown 07/10/2018 ? CTs 09/07/2018-multiple live small pulmonary nodules concerning for metastatic disease ? CT chest 12/10/2018-multiple bilateral lung nodules, some have increased in size ? CT chest 04/08/2019-mild enlargement of bilateral lung nodules ? Status post SBRT multiple lung nodules 05/06/2019, 05/08/2019, 05/13/2019 ? CT chest 08/28/2019-improvement and resolution in majority of right-sided pulmonary nodules, a superior segment right lower lobe nodule has increased, no new right-sided nodules. Progressive enlargement of left-sided pulmonary nodules ? PET scan 09/09/2019-hypermetabolic right lower lobe nodule, 2 hypermetabolic left upper lobe nodules with an additional 0.7 cm left upper lobe nodule below PET resolution, groundglass opacity in the mid to right lower lobe with associated hypermetabolism consistent with postradiation change, hypermetabolic central [id number] lesion ? Cycle 1 FOLFOX 10/14/2019 ? Cycle 2FOLFOX 11/11/2019, 5-FU and oxaliplatin dose reduced secondary to neutropenia and thrombocytopenia following cycle one, G-CSF declined ? Cycle 3 FOLFOX 11/26/2019 ? Cycle 4 FOLFOX 12/11/2019, oxaliplatin held due to neutropenia and thrombocytopenia ? Cycle 5 FOLFOX 12/31/2019 ? Cycle 6 FOLFOX 01/14/2020 (oxaliplatin held, 5-fluorouracil dose reduced due to mucositis) ? CT chest 01/27/2020-improvement in left lung nodules, progressive airspace disease with traction  bronchiectasis throughout the right lung, previously noted right lung  nodules are obscured, mildly enlarged right paratracheal lymph nodes-not hypermetabolic on prior PET, likely reactive ? Cycle 7 FOLFOX 01/28/2020 ? Cycle 8 FOLFOX 02/11/2020 (oxaliplatin held due to neutropenia and thrombocytopenia)  2.Hypothyroid  3. 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 Neutropenia and thrombocytopenia following cycle 1 FOLFOX-plan chemotherapy dose reductions, she declined G-CSF  6.  Mucositis following cycle 5 FOLFOX, 5-fluorouracil dose reduced with cycle 6  7.  Right lung airspace disease/volume loss-likely toxicity from chest radiation, trial of prednisone 01/29/2020; dyspnea and cough improved 02/11/2020   Disposition: [name] appears stable.  She has completed 7 cycles of FOLFOX.  Review of the CBC from today shows mild neutropenia, moderate thrombocytopenia.  We decided to proceed with cycle 8 today as scheduled but will hold the oxaliplatin.  She declines G-CSF support.  We reviewed neutropenic and thrombocytopenic precautions.  She understands to contact the office with fever, chills, other signs of infection, bleeding.  Dyspnea and cough have improved significantly since beginning a trial of prednisone on 01/29/2020.  She will continue the prednisone taper as prescribed.  She has a follow-up appointment with radiation oncology in 1 week.  She will return for lab, follow-up, cycle 9 FOLFOX in 2 weeks.  She will contact the office in the interim as outlined above or with any other problems.  Plan reviewed with Dr. Benay Spice.    Ned Card ANP/GNP-BC   02/11/2020  10:33 AM

## 2020-02-11 NOTE — Patient Instructions (Signed)

## 2020-02-12 ENCOUNTER — Telehealth: Payer: Self-pay | Admitting: Nurse Practitioner

## 2020-02-12 NOTE — Telephone Encounter (Signed)
Scheduled appointments per 1/12 los. Called patient, no answer. Left message with appointments date and times.  

## 2020-02-13 ENCOUNTER — Inpatient Hospital Stay: Payer: Medicare Other

## 2020-02-13 ENCOUNTER — Other Ambulatory Visit: Payer: Self-pay

## 2020-02-13 VITALS — BP 141/64 | HR 85 | Temp 98.4°F | Resp 18

## 2020-02-13 DIAGNOSIS — Z5111 Encounter for antineoplastic chemotherapy: Secondary | ICD-10-CM | POA: Diagnosis not present

## 2020-02-13 DIAGNOSIS — C20 Malignant neoplasm of rectum: Secondary | ICD-10-CM

## 2020-02-13 MED ORDER — HEPARIN SOD (PORK) LOCK FLUSH 100 UNIT/ML IV SOLN
500.0000 [IU] | Freq: Once | INTRAVENOUS | Status: AC | PRN
  Administered 2020-02-13: 500 [IU]
  Filled 2020-02-13: qty 5

## 2020-02-13 MED ORDER — SODIUM CHLORIDE 0.9% FLUSH
10.0000 mL | INTRAVENOUS | Status: DC | PRN
  Administered 2020-02-13: 10 mL
  Filled 2020-02-13: qty 10

## 2020-02-13 NOTE — Patient Instructions (Signed)
Implanted Port Insertion, Care After This sheet gives you information about how to care for yourself after your procedure. Your health care provider may also give you more specific instructions. If you have problems or questions, contact your health care provider. What can I expect after the procedure? After the procedure, it is common to have:  Discomfort at the port insertion site.  Bruising on the skin over the port. This should improve over 3-4 days. Follow these instructions at home: Port care  After your port is placed, you will get a manufacturer's information card. The card has information about your port. Keep this card with you at all times.  Take care of the port as told by your health care provider. Ask your health care provider if you or a family member can get training for taking care of the port at home. A home health care nurse may also take care of the port.  Make sure to remember what type of port you have. Incision care  Follow instructions from your health care provider about how to take care of your port insertion site. Make sure you: ? Wash your hands with soap and water before and after you change your bandage (dressing). If soap and water are not available, use hand sanitizer. ? Change your dressing as told by your health care provider. ? Leave stitches (sutures), skin glue, or adhesive strips in place. These skin closures may need to stay in place for 2 weeks or longer. If adhesive strip edges start to loosen and curl up, you may trim the loose edges. Do not remove adhesive strips completely unless your health care provider tells you to do that.  Check your port insertion site every day for signs of infection. Check for: ? Redness, swelling, or pain. ? Fluid or blood. ? Warmth. ? Pus or a bad smell.      Activity  Return to your normal activities as told by your health care provider. Ask your health care provider what activities are safe for you.  Do not  lift anything that is heavier than 10 lb (4.5 kg), or the limit that you are told, until your health care provider says that it is safe. General instructions  Take over-the-counter and prescription medicines only as told by your health care provider.  Do not take baths, swim, or use a hot tub until your health care provider approves. Ask your health care provider if you may take showers. You may only be allowed to take sponge baths.  Do not drive for 24 hours if you were given a sedative during your procedure.  Wear a medical alert bracelet in case of an emergency. This will tell any health care providers that you have a port.  Keep all follow-up visits as told by your health care provider. This is important. Contact a health care provider if:  You cannot flush your port with saline as directed, or you cannot draw blood from the port.  You have a fever or chills.  You have redness, swelling, or pain around your port insertion site.  You have fluid or blood coming from your port insertion site.  Your port insertion site feels warm to the touch.  You have pus or a bad smell coming from the port insertion site. Get help right away if:  You have chest pain or shortness of breath.  You have bleeding from your port that you cannot control. Summary  Take care of the port as told by your   health care provider. Keep the manufacturer's information card with you at all times.  Change your dressing as told by your health care provider.  Contact a health care provider if you have a fever or chills or if you have redness, swelling, or pain around your port insertion site.  Keep all follow-up visits as told by your health care provider. This information is not intended to replace advice given to you by your health care provider. Make sure you discuss any questions you have with your health care provider. Document Revised: 08/14/2017 Document Reviewed: 08/14/2017 Elsevier Patient Education   2021 Elsevier Inc.  

## 2020-02-18 ENCOUNTER — Ambulatory Visit
Admission: RE | Admit: 2020-02-18 | Discharge: 2020-02-18 | Disposition: A | Discharge status: Home / Self Care | Payer: Medicare Other | Source: Ambulatory Visit | Attending: Radiation Oncology | Admitting: Radiation Oncology

## 2020-02-18 ENCOUNTER — Other Ambulatory Visit: Payer: Self-pay

## 2020-02-18 ENCOUNTER — Encounter: Payer: Self-pay | Admitting: Radiation Oncology

## 2020-02-18 DIAGNOSIS — C7801 Secondary malignant neoplasm of right lung: Secondary | ICD-10-CM

## 2020-02-18 NOTE — Progress Notes (Signed)
Radiation Oncology         [phone] ________________________________ Outpatient Follow Up - Conducted via telephone due to current COVID-19 concerns for limiting patient exposure  I spoke with the patient to conduct this consult visit via telephone to spare the patient unnecessary potential exposure in the healthcare setting during the current COVID-19 pandemic. The patient was notified in advance and was offered a [place] meeting to allow for face to face communication but unfortunately reported that they did not have the appropriate resources/technology to support such a visit and instead preferred to proceed with a telephone visit.  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  09/17/2017 - 10/25/2017: The rectum was initially treated to 45 Gy in 25 fractions, followed by a 5.4 Gy boost delivered in 3 fractions, to yield a final total dose of 50.4 Gy.   Narrative: In summary this is a pleasant 70 y.o.  woman who was diagnosed with stage II adenocarcinoma of the rectum in the summer 2019 who received chemoradiation followed by low anterior resection and diverting ileostomy on 12/21/2017.  She began adjuvant Xeloda in December 2019, this was discontinued 2 months later secondary to patient preference and she proceeded with ileostomy takedown on 07/10/2018.   She had been doing well but in systemic imaging was found to have multiple small pulmonary nodules in the right lung in August 2020.  She return for repeat imaging on 12/10/2018, had 5 nodules in the right lung, a 8 mm right lower lobe nodule that had been previously been 6 mm, a 1.1 cm nodule that  had previously been 9 mm, a 5 mm lesion that had previously been 4 mm, and a 5 mm lesion that had also previously been 3 mm.  In the right middle lobe  the nodule seen was 4 mm, previously 2 mm.  Repeat CT imaging on 04/08/2019 revealed 4 nodules, the right lower lobe measuring 9 mm prior it had been 7, more anterior and lateral 1.3 cm right lower lobe nodule previously 1.1 cm, a more inferior and medial right lower lobe nodule measuring 1 cm previously 8 mm, and the right middle lobe nodule measuring 6 mm compared to 4 mm previously.  She proceeded with stereotactic body radiotherapy (SBRT) which she completed in April 2021. She has been followed closely but developed further lung disease in the left thorax by PET in August 2021. She restarted chemotherapy between 10/14/19 and has received 8 cycles of FOLFOX. Her regimen has been modified due to neuropathy, mucositis, and thrombocytopenia. A CT chest on 01/27/20 showed improvement of her left lung nodules, previously noted right nodules obscured by progressive airspace disease with traction bronchiectasis in the right lung and mildly enlarged right paratracheal nodes that were felt to be reactive. She was started on prednisone with improvement of symptoms of shortness of breath and dyspnea. She's contacted today to discuss her course.   On review of systems, the patient reports that she has been doing quite well since using oral steroids.  She states that prior to starting them she was having coughing episodes that would be so an option as she could not walk  across the room without coughing consistently.  She did not have any fever or productive mucus at that time, she reports though that she feels 95% better and only intermittently has a dry cough, she denies regular use of her inhaler at this time.  She is not experiencing chest pain.  She is currently taking prednisone 10 mg and today is the first day she is just taking 1/day.  She has 9 additional days before  completing this.  No other complaints are verbalized.   PAST MEDICAL HISTORY:  Past Medical History:  Diagnosis Date  . Anemia    remote hx.  . Arthritis    Bi lat knee  . Cancer ([place]) 07/2017   Rectal Cancer   . Fatty liver    ultrasound December 2012  . Hypothyroid 01/16/2013 dx  . Irregular heartbeat    PER PATIENT REPORT; ONSET 20+YEARS AGO SAW CARDIOLOGY AT THE TIME C/O "ITS BEAT REALLY FAST FOR A MINUTE AND THEN STOPPED " DENIES ANY OTHER CARDIAC SX';  REPORTS CARDIO DID ECHO WHICH WAS NEGATIVE;  NOW COMES AND GOES ;   . Lichen sclerosus    steroids as needed  . Temporary low platelet count ([place])    SEE LAST LABS IN EPIC    PAST SURGICAL HISTORY: Past Surgical History:  Procedure Laterality Date  . APPENDECTOMY  1967  . [facility]  . COLONOSCOPY  08/09/2017   danis - polyps  . ILEOSTOMY CLOSURE N/A 07/10/2018   Procedure: ILEOSTOMY LOOP TAKEDOWN WITH TAP BLOCK;  Surgeon: [name], MD;  Location: [facility];  Service: General;  Laterality: N/A;  . IR IMAGING GUIDED PORT INSERTION  09/29/2019  . OSTOMY N/A 12/21/2017   Procedure: DIVERTING LOOP OSTOMY;  Surgeon: [name], MD;  Location: [facility];  Service: General;  Laterality: N/A;  . PROCTOSCOPY N/A 12/21/2017   Procedure: RIGID PROCTOSCOPY;  Surgeon: [name], MD;  Location: [facility];  Service: General;  Laterality: N/A;  . XI ROBOTIC ASSISTED LOWER ANTERIOR RESECTION N/A 12/21/2017   Procedure: XI ROBOTIC ASSISTED LOWER ANTERIOR RESECTION;  Surgeon: [name], MD;  Location: [facility];  Service: General;  Laterality: N/A;    PAST SOCIAL HISTORY:  Social History   Socioeconomic History  . Marital status: Married    Spouse name: Not on file  . Number of children: Not on file  . Years of education: Not on file  . Highest education level: Not on file  Occupational History  . Not on file  Tobacco Use  . Smoking status: Never Smoker  . Smokeless tobacco: Never Used  Vaping Use  .  Vaping Use: Never used  Substance and Sexual Activity  . Alcohol use: No  . Drug use: No  . Sexual activity: Yes    Birth control/protection: Post-menopausal  Other Topics Concern  . Not on file  Social History Narrative  . Not on file   Social Determinants of Health   Financial Resource Strain: Not on file  Food Insecurity: Not on file  Transportation Needs: Not on file  Physical Activity: Not on file  Stress: Not on file  Social Connections: Not on file  Intimate Partner Violence: Not on file    PAST FAMILY HISTORY: Family History  Problem Relation Age of Onset  . Diabetes Mother   . Hypertension Mother   . COPD Mother   . Glaucoma Father 34  . Stroke Maternal Grandmother 7  . Hypertension Maternal Grandmother   .  Rheum arthritis Sister 50  . Hypertension Daughter   . Breast cancer Cousin   . Colon cancer Neg Hx   . Esophageal cancer Neg Hx   . Liver cancer Neg Hx   . Pancreatic cancer Neg Hx   . Stomach cancer Neg Hx   . Rectal cancer Neg Hx     MEDICATIONS  Current Outpatient Medications  Medication Sig Dispense Refill  . albuterol (PROVENTIL HFA) 108 (90 Base) MCG/ACT inhaler Inhale 2 puffs into the lungs every 6 (six) hours as needed for wheezing or shortness of breath. 8 g 0  . levothyroxine (SYNTHROID) 50 MCG tablet Take 1 tablet (50 mcg total) by mouth daily. Annual appt due in Oct must see provider for future refills 90 tablet 1  . lidocaine-prilocaine (EMLA) cream Apply 1 application topically as needed. Apply to port site 1-2 hours prior to use 30 g 2  . potassium chloride SA (KLOR-CON) 20 MEQ tablet Take 1 tablet (20 mEq total) by mouth daily. 30 tablet 2  . predniSONE (DELTASONE) 10 MG tablet Take 1 tablet (10 mg total) by mouth as directed. 40 mg qd X 4 days, then 30 mg qd X 8 days, then 20 mg qd X 8 days, then 10 mg qd X 10 days, then stop 66 tablet 0  . prochlorperazine (COMPAZINE) 10 MG tablet Take 1 tablet (10 mg total) by mouth every 6 (six)  hours as needed for nausea or vomiting. 30 tablet 2  . Pseudoeph-Bromphen-DM (ROBITUSSIN ALLERGY/COUGH PO) Take 10 mLs by mouth every 4 (four) hours as needed.    Marland Kitchen Spacer/Aero-Holding Chambers (AEROCHAMBER MV) inhaler Use as instructed 1 each 2   No current facility-administered medications for this encounter.    ALLERGIES:  Allergies  Allergen Reactions  . Epinephrine Palpitations  . Losartan Hives    Physical Findings: Unable to assess due to encounter type.  Radiographic Findings: CT Chest Wo Contrast  Result Date: 01/27/2020 CLINICAL DATA:  Metastatic rectal cancer to the lungs. Assess treatment response. Undergoing chemotherapy. EXAM: CT CHEST WITHOUT CONTRAST TECHNIQUE: Multidetector CT imaging of the chest was performed following the standard protocol without IV contrast. COMPARISON:  CT chest 08/27/2019 and 04/08/2019.  PET-CT 09/09/2019. FINDINGS: Cardiovascular: Mild atherosclerosis of the aorta, great vessels and coronary arteries. Right IJ Port-A-Cath extends to the level of the inferior right atrium. The heart size is normal. There is no pericardial effusion. Mediastinum/Nodes: Small right paratracheal lymph nodes have mildly enlarged compared with the most recent CT, but were not significantly hypermetabolic on prior PET-CT. These include an 8 mm short axis node on image 34/2, a 10 mm node on image 41/2 and a 9 mm node on image 47/2. No other enlarged mediastinal, hilar or axillary lymph nodes are identified. The right hilum is partly obscured by adjacent parenchymal opacity. There is a small hiatal hernia. Lungs/Pleura: New small right pleural effusion. No left pleural effusion or pneumothorax. There is progressive airspace disease with volume loss and traction bronchiectasis throughout the right lung which likely relates to radiation therapy. The previously demonstrated right lung nodules are largely obscured. There is new ill-defined airspace opacity medially in the left lower  lobe adjacent to the descending aorta, measuring up to 2.9 x 1.2 cm on image 92/5, possibly related to radiation therapy as well. The previously demonstrated small left lung nodules appear mildly improved. For example, left upper lobe nodule measuring 5 mm on image 57/5 previously measured 6 mm. A central lingular nodule measuring approximately 8 mm  on image 67/5 previously measured 11 mm. A posterior lingular nodule measuring 5 mm on image 74/5 previously measured 6 mm. No new or enlarging nodules are identified. Upper abdomen: The visualized upper abdomen appears unchanged. No focal hepatic lesions are identified on noncontrast imaging. There are multiple small calcified gallstones. Musculoskeletal/Chest wall: There is no chest wall mass or suspicious osseous finding. IMPRESSION: 1. Interval improvement in small left lung nodules consistent with response to therapy. 2. Progressive airspace disease with volume loss and traction bronchiectasis throughout the right lung, likely related to radiation therapy. The previously demonstrated right lung nodules are largely obscured. No definite disease progression identified. 3. Mildly enlarged right paratracheal lymph nodes, not significantly hypermetabolic on prior PET-CT, probably reactive. 4. Cholelithiasis. 5. Aortic Atherosclerosis (ICD10-I70.0). Electronically Signed   By: Richardean Sale M.D.   On: 01/27/2020 11:35    Impression/Plan: 1. Recurrent Metastatic Stage IIA, cT3N0M0 adenocarcinoma of the rectum with bilateral lung disease.  The patient has developed pneumonitis with delayed onset following stereotactic radiation.  This is most likely amplified by the fact that she has been on systemic treatment for developing new disease in the left lung.  Dr. Lisbeth Renshaw has spoken with Dr. Benay Spice about her case and agreed in the treatment with oral prednisone.  The patient sounds as though she is doing much better clinically and is pleased with how well she is feeling  now.  We did discuss that we would follow back up with her in a few weeks by phone to check on how she is doing several days following completion of prednisone just in case she needs any extension of this.  Hopefully this will only be an episodic event and not a recurring problem.  She is in agreement and will keep [place] informed of any questions or concerns that arise prior to our next call approximately 2 weeks from now.  She will otherwise continue her systemic chemotherapy with Dr. Benay Spice and sees him next week.   Given current concerns for patient exposure during the COVID-19 pandemic, this encounter was conducted via telephone.  The patient has provided two factor identification and has given verbal consent for this type of encounter and has been advised to only accept a meeting of this type in a secure network environment. The time spent during this encounter was 15 minutes including preparation, discussion, and coordination of the patient's care. The attendants for this meeting include Hayden Pedro  and Lynetta Mare.  During the encounter, Hayden Pedro was located at [facility] Radiation Oncology Department.  KYILEE COPLIN was located at home.     Carola Rhine, PAC

## 2020-02-22 ENCOUNTER — Other Ambulatory Visit: Payer: Self-pay | Admitting: Oncology

## 2020-02-24 MED FILL — Dexamethasone Sodium Phosphate Inj 100 MG/10ML: INTRAMUSCULAR | Qty: 1 | Status: AC

## 2020-02-25 ENCOUNTER — Inpatient Hospital Stay: Payer: Medicare Other

## 2020-02-25 ENCOUNTER — Other Ambulatory Visit: Payer: Self-pay

## 2020-02-25 ENCOUNTER — Inpatient Hospital Stay (HOSPITAL_BASED_OUTPATIENT_CLINIC_OR_DEPARTMENT_OTHER): Payer: Medicare Other | Admitting: Nurse Practitioner

## 2020-02-25 ENCOUNTER — Encounter: Payer: Self-pay | Admitting: Nurse Practitioner

## 2020-02-25 VITALS — BP 145/71 | HR 84

## 2020-02-25 VITALS — BP 181/79 | HR 99 | Temp 98.6°F | Resp 16 | Ht 63.0 in | Wt 203.0 lb

## 2020-02-25 DIAGNOSIS — C20 Malignant neoplasm of rectum: Secondary | ICD-10-CM

## 2020-02-25 DIAGNOSIS — Z5111 Encounter for antineoplastic chemotherapy: Secondary | ICD-10-CM | POA: Diagnosis not present

## 2020-02-25 DIAGNOSIS — Z95828 Presence of other vascular implants and grafts: Secondary | ICD-10-CM

## 2020-02-25 LAB — CMP (CANCER CENTER ONLY)
ALT: 19 U/L (ref 0–44)
AST: 19 U/L (ref 15–41)
Albumin: 2.9 g/dL — ABNORMAL LOW (ref 3.5–5.0)
Alkaline Phosphatase: 48 U/L (ref 38–126)
Anion gap: 8 (ref 5–15)
BUN: 14 mg/dL (ref 8–23)
CO2: 25 mmol/L (ref 22–32)
Calcium: 8.6 mg/dL — ABNORMAL LOW (ref 8.9–10.3)
Chloride: 110 mmol/L (ref 98–111)
Creatinine: 0.77 mg/dL (ref 0.44–1.00)
GFR, Estimated: >60 mL/min (ref >60)
Glucose, Bld: 102 mg/dL — ABNORMAL HIGH (ref 70–99)
Potassium: 3.8 mmol/L (ref 3.5–5.1)
Sodium: 143 mmol/L (ref 135–145)
Total Bilirubin: 0.6 mg/dL (ref 0.3–1.2)
Total Protein: 6 g/dL — ABNORMAL LOW (ref 6.5–8.1)

## 2020-02-25 LAB — CBC WITH DIFFERENTIAL (CANCER CENTER ONLY)
Abs Immature Granulocytes: 0.03 10*3/uL (ref 0.00–0.07)
Basophils Absolute: 0 10*3/uL (ref 0.0–0.1)
Basophils Relative: 0 %
Eosinophils Absolute: 0.1 10*3/uL (ref 0.0–0.5)
Eosinophils Relative: 1 %
HCT: 32.5 % — ABNORMAL LOW (ref 36.0–46.0)
Hemoglobin: 11 g/dL — ABNORMAL LOW (ref 12.0–15.0)
Immature Granulocytes: 1 %
Lymphocytes Relative: 12 %
Lymphs Abs: 0.6 10*3/uL — ABNORMAL LOW (ref 0.7–4.0)
MCH: 35.3 pg — ABNORMAL HIGH (ref 26.0–34.0)
MCHC: 33.8 g/dL (ref 30.0–36.0)
MCV: 104.2 fL — ABNORMAL HIGH (ref 80.0–100.0)
Monocytes Absolute: 0.7 10*3/uL (ref 0.1–1.0)
Monocytes Relative: 14 %
Neutro Abs: 3.7 10*3/uL (ref 1.7–7.7)
Neutrophils Relative %: 72 %
Platelet Count: 60 10*3/uL — ABNORMAL LOW (ref 150–400)
RBC: 3.12 MIL/uL — ABNORMAL LOW (ref 3.87–5.11)
RDW: 15.4 % (ref 11.5–15.5)
WBC Count: 5.1 10*3/uL (ref 4.0–10.5)
nRBC: 0 % (ref 0.0–0.2)

## 2020-02-25 MED ORDER — SODIUM CHLORIDE 0.9% FLUSH
10.0000 mL | Freq: Once | INTRAVENOUS | Status: AC
  Administered 2020-02-25: 10 mL
  Filled 2020-02-25: qty 10

## 2020-02-25 MED ORDER — SODIUM CHLORIDE 0.9 % IV SOLN
1400.0000 mg/m2 | INTRAVENOUS | Status: DC
  Administered 2020-02-25: 2850 mg via INTRAVENOUS
  Filled 2020-02-25: qty 57

## 2020-02-25 NOTE — Patient Instructions (Signed)
[facility] Discharge Instructions for Patients Receiving Chemotherapy  Today you received the following chemotherapy agents Flourouracil (ADRUCIL).  To help prevent nausea and vomiting after your treatment, we encourage you to take your nausea medication as prescribed.   If you develop nausea and vomiting that is not controlled by your nausea medication, call the clinic.   BELOW ARE SYMPTOMS THAT SHOULD BE REPORTED IMMEDIATELY:  *FEVER GREATER THAN 100.5 F  *CHILLS WITH OR WITHOUT FEVER  NAUSEA AND VOMITING THAT IS NOT CONTROLLED WITH YOUR NAUSEA MEDICATION  *UNUSUAL SHORTNESS OF BREATH  *UNUSUAL BRUISING OR BLEEDING  TENDERNESS IN MOUTH AND THROAT WITH OR WITHOUT PRESENCE OF ULCERS  *URINARY PROBLEMS  *BOWEL PROBLEMS  UNUSUAL RASH Items with * indicate a potential emergency and should be followed up as soon as possible.  Feel free to call the clinic should you have any questions or concerns. The clinic phone number is [phone].  Please show the CHEMO ALERT CARD at check-in to the Emergency Department and triage nurse.   

## 2020-02-25 NOTE — Progress Notes (Signed)
Per Ned Card, NP: OK to treat today. Will only receive 5FU pump today.

## 2020-02-25 NOTE — Progress Notes (Signed)
[facility] OFFICE PROGRESS NOTE   Diagnosis: Rectal cancer  INTERVAL HISTORY:   Samantha Lutz returns as scheduled.  She completed cycle 8 FOLFOX 02/11/2020.  Oxaliplatin was held with cycle 8 due to neutropenia and thrombocytopenia.  She denies nausea/vomiting.  No mouth sores.  No diarrhea.  No hand or foot pain or redness.  Some tingling in the fingertips at nighttime.  She denies bleeding.  Cough and dyspnea continue to be significantly improved.  She is on a prednisone taper.  No fever.  Objective:  Vital signs in last 24 hours:  Blood pressure (!) 181/79, pulse 99, temperature 98.6 F (37 C), temperature source Tympanic, resp. rate 16, height '5\' 3"'  (1.6 m), weight 203 lb (92.1 kg), SpO2 96 %.    HEENT: No thrush or ulcers. Resp: Lungs clear bilaterally. Cardio: Regular rate and rhythm. GI: Abdomen soft and nontender.  No hepatomegaly. Vascular: No leg edema.  Skin: Palms without erythema. Port-A-Cath without erythema.   Lab Results:  Lab Results  Component Value Date   WBC 5.1 02/25/2020   HGB 11.0 (L) 02/25/2020   HCT 32.5 (L) 02/25/2020   MCV 104.2 (H) 02/25/2020   PLT 60 (L) 02/25/2020   NEUTROABS 3.7 02/25/2020    Imaging:  No results found.  Medications: I have reviewed the patient's current medications.  Assessment/Plan: 1. Rectal cancer ? Mass at 7 cm from the anal verge on colonoscopy 08/09/2017, biopsy revealed invasive adenocarcinoma ? Staging CTs 08/17/2017-no evidence of metastatic disease, asymmetric thickening in the mid rectum ? MR pelvis 09/01/2017, T3N0 lesion beginning at 6.3 cm from the anal sphincter ? Radiation/Xeloda initiated 09/17/2017, completed 10/25/2017 ? Low anterior resection/diverting ileostomy 12/21/2017,ypT3,ypN1atumor. Lymphovascular invasion present, intact mismatch repair protein expression, treatment effect present (TRS 1). Mismatch repair protein IHC normal;Foundation 1-KRAS/NRAS wild-type, microsatellite  status and tumor mutational burden could not be determined. ? Cycle 1 adjuvant Xeloda beginning 01/21/2018 ? Cycle 2 adjuvant Xeloda beginning 02/11/2018 ? Xeloda discontinued after cycle 2 secondary to patient preference ? Ileostomy takedown 07/10/2018 ? CTs 09/07/2018-multiple live small pulmonary nodules concerning for metastatic disease ? CT chest 12/10/2018-multiple bilateral lung nodules, some have increased in size ? CT chest 04/08/2019-mild enlargement of bilateral lung nodules ? Status post SBRT multiple lung nodules 05/06/2019, 05/08/2019, 05/13/2019 ? CT chest 08/28/2019-improvement and resolution in majority of right-sided pulmonary nodules, a superior segment right lower lobe nodule has increased, no new right-sided nodules. Progressive enlargement of left-sided pulmonary nodules ? PET scan 09/09/2019-hypermetabolic right lower lobe nodule, 2 hypermetabolic left upper lobe nodules with an additional 0.7 cm left upper lobe nodule below PET resolution, groundglass opacity in the mid to right lower lobe with associated hypermetabolism consistent with postradiation change, hypermetabolic central [id number] lesion ? Cycle 1 FOLFOX 10/14/2019 ? Cycle 2FOLFOX 11/11/2019, 5-FU and oxaliplatin dose reduced secondary to neutropenia and thrombocytopenia following cycle one, G-CSF declined ? Cycle 3 FOLFOX 11/26/2019 ? Cycle 4 FOLFOX 12/11/2019, oxaliplatin held due to neutropenia and thrombocytopenia ? Cycle 5 FOLFOX 12/31/2019 ? Cycle 6 FOLFOX 01/14/2020 (oxaliplatin held, 5-fluorouracil dose reduced due to mucositis) ? CT chest 01/27/2020-improvement in left lung nodules, progressive airspace disease with traction bronchiectasis throughout the right lung, previously noted right lung nodules are obscured, mildly enlarged right paratracheal lymph nodes-not hypermetabolic on prior PET, likely reactive ? Cycle 7 FOLFOX 01/28/2020 ? Cycle 8 FOLFOX 02/11/2020 (oxaliplatin held due to neutropenia and  thrombocytopenia) ? Cycle 9 FOLFOX 02/25/2020 (oxaliplatin held due to thrombocytopenia)  2.Hypothyroid  3. 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 Neutropenia and thrombocytopenia following cycle 1 FOLFOX-plan chemotherapy dose reductions, she declined G-CSF  6. Mucositis following cycle 5 FOLFOX, 5-fluorouracil dose reduced with cycle 6  7.Right lung airspace disease/volume loss-likely toxicity from chest radiation, trial of prednisone 01/29/2020; dyspnea and cough improved 02/11/2020   Disposition: Ms. Sample appears stable.  She has completed 8 cycles of FOLFOX.  Oxaliplatin was held with cycle 8 due to neutropenia and thrombocytopenia.  Review of the CBC from today shows persistent stable thrombocytopenia.  The white count has recovered.  Plan to proceed with cycle 9 FOLFOX today as scheduled, hold oxaliplatin due to thrombocytopenia.  She understands to contact the office with any bleeding.  She will return for lab follow-up, FOLFOX in 2 weeks.  We are available to see her sooner if needed.    Ned Card ANP/GNP-BC   02/25/2020  9:26 AM

## 2020-02-26 ENCOUNTER — Telehealth: Payer: Self-pay | Admitting: Nurse Practitioner

## 2020-02-26 NOTE — Telephone Encounter (Signed)
Scheduled appointments per 1/26 los. Called patient, no answer. Left message with appointments dates and times.

## 2020-02-27 ENCOUNTER — Inpatient Hospital Stay: Payer: Medicare Other

## 2020-02-27 ENCOUNTER — Other Ambulatory Visit: Payer: Self-pay

## 2020-02-27 VITALS — BP 159/90 | HR 86 | Resp 18

## 2020-02-27 DIAGNOSIS — C20 Malignant neoplasm of rectum: Secondary | ICD-10-CM

## 2020-02-27 DIAGNOSIS — Z5111 Encounter for antineoplastic chemotherapy: Secondary | ICD-10-CM | POA: Diagnosis not present

## 2020-02-27 MED ORDER — SODIUM CHLORIDE 0.9% FLUSH
10.0000 mL | INTRAVENOUS | Status: DC | PRN
  Administered 2020-02-27: 10 mL
  Filled 2020-02-27: qty 10

## 2020-02-27 MED ORDER — HEPARIN SOD (PORK) LOCK FLUSH 100 UNIT/ML IV SOLN
500.0000 [IU] | Freq: Once | INTRAVENOUS | Status: AC | PRN
  Administered 2020-02-27: 500 [IU]
  Filled 2020-02-27: qty 5

## 2020-03-07 ENCOUNTER — Other Ambulatory Visit: Payer: Self-pay | Admitting: Oncology

## 2020-03-08 ENCOUNTER — Telehealth: Payer: Self-pay | Admitting: Radiation Oncology

## 2020-03-08 NOTE — Telephone Encounter (Signed)
I called the patient to check on how she's doing. Her last dose of prednisone was supposed to be on 02/28/20. I left a message asking her to call me back so we could find out how she was doing since the conclusion of her taper.     Carola Rhine, PAC

## 2020-03-10 MED FILL — Dexamethasone Sodium Phosphate Inj 100 MG/10ML: INTRAMUSCULAR | Qty: 1 | Status: AC

## 2020-03-11 ENCOUNTER — Inpatient Hospital Stay: Payer: Medicare Other | Attending: Oncology

## 2020-03-11 ENCOUNTER — Other Ambulatory Visit: Payer: Self-pay | Admitting: *Deleted

## 2020-03-11 ENCOUNTER — Inpatient Hospital Stay: Payer: Medicare Other

## 2020-03-11 ENCOUNTER — Inpatient Hospital Stay (HOSPITAL_BASED_OUTPATIENT_CLINIC_OR_DEPARTMENT_OTHER): Payer: Medicare Other | Admitting: Oncology

## 2020-03-11 ENCOUNTER — Other Ambulatory Visit: Payer: Self-pay

## 2020-03-11 VITALS — BP 147/68 | HR 68 | Temp 99.0°F | Resp 18 | Ht 63.0 in | Wt 201.5 lb

## 2020-03-11 DIAGNOSIS — C20 Malignant neoplasm of rectum: Secondary | ICD-10-CM | POA: Insufficient documentation

## 2020-03-11 DIAGNOSIS — R059 Cough, unspecified: Secondary | ICD-10-CM | POA: Diagnosis not present

## 2020-03-11 DIAGNOSIS — D696 Thrombocytopenia, unspecified: Secondary | ICD-10-CM | POA: Insufficient documentation

## 2020-03-11 DIAGNOSIS — R0600 Dyspnea, unspecified: Secondary | ICD-10-CM | POA: Diagnosis not present

## 2020-03-11 DIAGNOSIS — Z79899 Other long term (current) drug therapy: Secondary | ICD-10-CM | POA: Diagnosis not present

## 2020-03-11 DIAGNOSIS — T451X5A Adverse effect of antineoplastic and immunosuppressive drugs, initial encounter: Secondary | ICD-10-CM | POA: Diagnosis not present

## 2020-03-11 DIAGNOSIS — R21 Rash and other nonspecific skin eruption: Secondary | ICD-10-CM | POA: Insufficient documentation

## 2020-03-11 DIAGNOSIS — Z5111 Encounter for antineoplastic chemotherapy: Secondary | ICD-10-CM | POA: Insufficient documentation

## 2020-03-11 DIAGNOSIS — G620 Drug-induced polyneuropathy: Secondary | ICD-10-CM | POA: Insufficient documentation

## 2020-03-11 DIAGNOSIS — Z95828 Presence of other vascular implants and grafts: Secondary | ICD-10-CM

## 2020-03-11 LAB — CBC WITH DIFFERENTIAL (CANCER CENTER ONLY)
Abs Immature Granulocytes: 0.02 10*3/uL (ref 0.00–0.07)
Basophils Absolute: 0 10*3/uL (ref 0.0–0.1)
Basophils Relative: 1 %
Eosinophils Absolute: 0.1 10*3/uL (ref 0.0–0.5)
Eosinophils Relative: 3 %
HCT: 32.7 % — ABNORMAL LOW (ref 36.0–46.0)
Hemoglobin: 11.1 g/dL — ABNORMAL LOW (ref 12.0–15.0)
Immature Granulocytes: 1 %
Lymphocytes Relative: 13 %
Lymphs Abs: 0.5 10*3/uL — ABNORMAL LOW (ref 0.7–4.0)
MCH: 35.5 pg — ABNORMAL HIGH (ref 26.0–34.0)
MCHC: 33.9 g/dL (ref 30.0–36.0)
MCV: 104.5 fL — ABNORMAL HIGH (ref 80.0–100.0)
Monocytes Absolute: 0.7 10*3/uL (ref 0.1–1.0)
Monocytes Relative: 16 %
Neutro Abs: 2.9 10*3/uL (ref 1.7–7.7)
Neutrophils Relative %: 66 %
Platelet Count: 91 10*3/uL — ABNORMAL LOW (ref 150–400)
RBC: 3.13 MIL/uL — ABNORMAL LOW (ref 3.87–5.11)
RDW: 15.3 % (ref 11.5–15.5)
WBC Count: 4.3 10*3/uL (ref 4.0–10.5)
nRBC: 0 % (ref 0.0–0.2)

## 2020-03-11 LAB — CMP (CANCER CENTER ONLY)
ALT: 11 U/L (ref 0–44)
AST: 18 U/L (ref 15–41)
Albumin: 2.9 g/dL — ABNORMAL LOW (ref 3.5–5.0)
Alkaline Phosphatase: 53 U/L (ref 38–126)
Anion gap: 10 (ref 5–15)
BUN: 10 mg/dL (ref 8–23)
CO2: 21 mmol/L — ABNORMAL LOW (ref 22–32)
Calcium: 8.3 mg/dL — ABNORMAL LOW (ref 8.9–10.3)
Chloride: 111 mmol/L (ref 98–111)
Creatinine: 0.75 mg/dL (ref 0.44–1.00)
GFR, Estimated: >60 mL/min (ref >60)
Glucose, Bld: 109 mg/dL — ABNORMAL HIGH (ref 70–99)
Potassium: 3.5 mmol/L (ref 3.5–5.1)
Sodium: 142 mmol/L (ref 135–145)
Total Bilirubin: 1.1 mg/dL (ref 0.3–1.2)
Total Protein: 6 g/dL — ABNORMAL LOW (ref 6.5–8.1)

## 2020-03-11 MED ORDER — SODIUM CHLORIDE 0.9 % IV SOLN
1400.0000 mg/m2 | INTRAVENOUS | Status: DC
  Administered 2020-03-11: 2850 mg via INTRAVENOUS
  Filled 2020-03-11: qty 57

## 2020-03-11 MED ORDER — SODIUM CHLORIDE 0.9% FLUSH
10.0000 mL | Freq: Once | INTRAVENOUS | Status: AC
  Administered 2020-03-11: 10 mL
  Filled 2020-03-11: qty 10

## 2020-03-11 MED ORDER — SODIUM CHLORIDE 0.9% FLUSH
10.0000 mL | INTRAVENOUS | Status: DC | PRN
  Filled 2020-03-11: qty 10

## 2020-03-11 MED ORDER — PALONOSETRON HCL INJECTION 0.25 MG/5ML
INTRAVENOUS | Status: AC
  Filled 2020-03-11: qty 5

## 2020-03-11 MED ORDER — ALBUTEROL SULFATE HFA 108 (90 BASE) MCG/ACT IN AERS: 2.0000 | INHALATION_SPRAY | Freq: Four times a day (QID) | RESPIRATORY_TRACT | 0 refills | Status: DC | PRN

## 2020-03-11 MED ORDER — PREDNISONE 10 MG PO TABS: 20.0000 mg | ORAL_TABLET | Freq: Every day | ORAL | 0 refills | Status: DC

## 2020-03-11 NOTE — Progress Notes (Signed)
[facility] OFFICE PROGRESS NOTE   Diagnosis: Rectal cancer  INTERVAL HISTORY:  Samantha Lutz complete another cycle of 5-FU on 02/25/2020.  She reports increased peripheral numbness.  This does not interfere with activity.  She has noted increased cough and intermittent dyspnea.  She has a rash over the face.  Objective:  Vital signs in last 24 hours:  Blood pressure (!) 147/68, pulse 68, temperature 99 F (37.2 C), temperature source Tympanic, resp. rate 18, height '5\' 3"'  (1.6 m), weight 201 lb 8 oz (91.4 kg), SpO2 99 %.    HEENT: No thrush or ulcers Resp: Lungs clear bilaterally Cardio: Regular rate and rhythm GI: No hepatosplenomegaly, nontender Vascular: No leg edema Neuro: Moderate loss of vibratory sense at the fingertips bilaterally Skin: No apparent rash over the face, palms without erythema  Portacath/PICC-without erythema  Lab Results:  Lab Results  Component Value Date   WBC 4.3 03/11/2020   HGB 11.1 (L) 03/11/2020   HCT 32.7 (L) 03/11/2020   MCV 104.5 (H) 03/11/2020   PLT 91 (L) 03/11/2020   NEUTROABS 2.9 03/11/2020    CMP  Lab Results  Component Value Date   NA 143 02/25/2020   K 3.8 02/25/2020   CL 110 02/25/2020   CO2 25 02/25/2020   GLUCOSE 102 (H) 02/25/2020   BUN 14 02/25/2020   CREATININE 0.77 02/25/2020   CALCIUM 8.6 (L) 02/25/2020   PROT 6.0 (L) 02/25/2020   ALBUMIN 2.9 (L) 02/25/2020   AST 19 02/25/2020   ALT 19 02/25/2020   ALKPHOS 48 02/25/2020   BILITOT 0.6 02/25/2020   GFRNONAA >60 02/25/2020   GFRAA >60 10/28/2019    Lab Results  Component Value Date   CEA1 1.17 10/13/2019    Medications: I have reviewed the patient's current medications.   Assessment/Plan:  1. Rectal cancer ? Mass at 7 cm from the anal verge on colonoscopy 08/09/2017, biopsy revealed invasive adenocarcinoma ? Staging CTs 08/17/2017-no evidence of metastatic disease, asymmetric thickening in the mid rectum ? MR pelvis 09/01/2017, T3N0  lesion beginning at 6.3 cm from the anal sphincter ? Radiation/Xeloda initiated 09/17/2017, completed 10/25/2017 ? Low anterior resection/diverting ileostomy 12/21/2017,ypT3,ypN1atumor. Lymphovascular invasion present, intact mismatch repair protein expression, treatment effect present (TRS 1). Mismatch repair protein IHC normal;Foundation 1-KRAS/NRAS wild-type, microsatellite status and tumor mutational burden could not be determined. ? Cycle 1 adjuvant Xeloda beginning 01/21/2018 ? Cycle 2 adjuvant Xeloda beginning 02/11/2018 ? Xeloda discontinued after cycle 2 secondary to patient preference ? Ileostomy takedown 07/10/2018 ? CTs 09/07/2018-multiple live small pulmonary nodules concerning for metastatic disease ? CT chest 12/10/2018-multiple bilateral lung nodules, some have increased in size ? CT chest 04/08/2019-mild enlargement of bilateral lung nodules ? Status post SBRT multiple lung nodules 05/06/2019, 05/08/2019, 05/13/2019 ? CT chest 08/28/2019-improvement and resolution in majority of right-sided pulmonary nodules, a superior segment right lower lobe nodule has increased, no new right-sided nodules. Progressive enlargement of left-sided pulmonary nodules ? PET scan 09/09/2019-hypermetabolic right lower lobe nodule, 2 hypermetabolic left upper lobe nodules with an additional 0.7 cm left upper lobe nodule below PET resolution, groundglass opacity in the mid to right lower lobe with associated hypermetabolism consistent with postradiation change, hypermetabolic central [id number] lesion ? Cycle 1 FOLFOX 10/14/2019 ? Cycle 2FOLFOX 11/11/2019, 5-FU and oxaliplatin dose reduced secondary to neutropenia and thrombocytopenia following cycle one, G-CSF declined ? Cycle 3 FOLFOX 11/26/2019 ? Cycle 4 FOLFOX 12/11/2019, oxaliplatin held due to neutropenia and thrombocytopenia ? Cycle 5 FOLFOX 12/31/2019 ? Cycle 6 FOLFOX  01/14/2020 (oxaliplatin held, 5-fluorouracil dose reduced due to mucositis) ? CT  chest 01/27/2020-improvement in left lung nodules, progressive airspace disease with traction bronchiectasis throughout the right lung, previously noted right lung nodules are obscured, mildly enlarged right paratracheal lymph nodes-not hypermetabolic on prior PET, likely reactive ? Cycle 7 FOLFOX 01/28/2020 ? Cycle 8 FOLFOX 02/11/2020 (oxaliplatin held due to neutropenia and thrombocytopenia) ? Cycle 9 FOLFOX 02/25/2020 (oxaliplatin held due to thrombocytopenia) ? Cycle 10 FOLFOX 03/11/2020 (oxaliplatin held secondary to neuropathy)  2.Hypothyroid  3. 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 Neutropenia and thrombocytopenia following cycle 1 FOLFOX-plan chemotherapy dose reductions, she declined G-CSF  6. Mucositis following cycle 5 FOLFOX, 5-fluorouracil dose reduced with cycle 6  7.Right lung airspace disease/volume loss-likely toxicity from chest radiation, trial of prednisone 01/29/2020; dyspnea and cough improved 02/11/2020  Progressive cough 03/11/2020-prednisone resumed at a dose of 20 mg daily  8.  Oxaliplatin neuropathy    Disposition: Samantha Lutz has completed 9 cycles of systemic therapy with FOLFOX.  Oxaliplatin has been given with 5 cycles.  She has oxaliplatin neuropathy.  We decided to continue holding oxaliplatin.  She will complete another cycle of 5-FU today.  We will plan for a restaging CT evaluation after the next cycle of chemotherapy.  She has a recurrent cough, presumably from radiation pneumonitis.  She will resume prednisone at a dose of 20 mg daily.  She will let us know if the cough does not improve.  Ms. Pasqual will return for an office visit and chemotherapy in 2 weeks.  The plan is to change to irinotecan/Panitumumab based therapy when there is evidence of disease progression.  Betsy Coder, MD  03/11/2020  9:37 AM

## 2020-03-11 NOTE — Progress Notes (Signed)
Per Dr. Benay Spice: OK to treat today with platelet 91,000. Holding Oxaliplatin.

## 2020-03-11 NOTE — Patient Instructions (Signed)
[facility] Discharge Instructions for Patients Receiving Chemotherapy  Today you received the following chemotherapy agent: Fluorouracil  To help prevent nausea and vomiting after your treatment, we encourage you to take your nausea medication as directed by your MD.   If you develop nausea and vomiting that is not controlled by your nausea medication, call the clinic.   BELOW ARE SYMPTOMS THAT SHOULD BE REPORTED IMMEDIATELY:  *FEVER GREATER THAN 100.5 F  *CHILLS WITH OR WITHOUT FEVER  NAUSEA AND VOMITING THAT IS NOT CONTROLLED WITH YOUR NAUSEA MEDICATION  *UNUSUAL SHORTNESS OF BREATH  *UNUSUAL BRUISING OR BLEEDING  TENDERNESS IN MOUTH AND THROAT WITH OR WITHOUT PRESENCE OF ULCERS  *URINARY PROBLEMS  *BOWEL PROBLEMS  UNUSUAL RASH Items with * indicate a potential emergency and should be followed up as soon as possible.  Feel free to call the clinic should you have any questions or concerns. The clinic phone number is [phone].  Please show the [place] at check-in to the Emergency Department and triage nurse.  [facility] Discharge Instructions for Patients receiving Home Portable Chemo Pump    **The bag should finish at 46 hours, 96 hours or 7 days. For example, if your pump is scheduled for 46 hours and it was put on at 4pm, it should finish at 2 pm the day it is scheduled to come off regardless of your appointment time.    Estimated time to finish   _________________________ (Have your nurse fill in)     ** if the display on your pump reads "Low Volume" and it is beeping, take the batteries out of the pump and come to the cancer center for it to be taken off.   **If the pump alarms go off prior to the pump reading "Low Volume" then call the [phone] and someone can assist you.  **If the plunger comes out and the bag fluid is running out, please use your chemo spill kit to clean up the spill. Do not use paper towels or  other house hold products.  ** If you have problems or questions regarding your pump, please call either the [phone] or the cancer center Monday-Friday 8:00am-4:30pm at [phone] and we will assist you.  If you are unable to get assistance then go to [facility] Emergency Room, ask the staff to contact the IV team for assistance.     Thrombocytopenia Thrombocytopenia means that you have a low number of platelets in your blood. Platelets are tiny cells in the blood. When you bleed, they clump together at the cut or injury to stop the bleeding. This is called blood clotting. If you do not have enough platelets, it can cause bleeding problems. Some cases of this condition are mild while others are more severe. What are the causes? This condition may be caused by:  Your body not making enough platelets. This may be caused by: ? Your bone marrow not making blood cells (aplastic anemia). ? Cancer in the bone marrow. ? Certain medicines. ? Infection in the bone marrow. ? Drinking a lot of alcohol.  Your body destroying platelets too quickly. This may be caused by: ? Certain immune diseases. ? Certain medicines. ? Certain blood clotting disorders. ? Certain disorders that are passed from parent to child (inherited). ? Certain bleeding disorders. ? Pregnancy. ? Having a spleen that is larger than normal. What are the signs or symptoms?  Bleeding that is not normal.  Nosebleeds.  Heavy menstrual periods.  Blood in the pee (urine) or poop (stool).  A purple-like color to the skin (purpura).  Bruising.  A rash that looks like pinpoint, purple-red spots (petechiae). How is this treated?  Treatment of another condition that is causing the low platelet count.  Medicines to help protect your platelets from being destroyed.  A replacement (transfusion) of platelets to stop or prevent bleeding.  Surgery to remove the spleen. Follow these instructions at  home: Activity  Avoid activities that could cause you to get hurt or bruised. Follow instructions about how to prevent falls.  Take care not to cut yourself: ? When you shave. ? When you use scissors, needles, knives, or other tools.  Take care not to burn yourself: ? When you use an iron. ? When you cook. General instructions  Check your skin and the inside of your mouth for bruises or blood as told by your doctor.  Check to see if there is blood in your spit (sputum), pee, and poop. Do this as told by your doctor.  Do not drink alcohol.  Take over-the-counter and prescription medicines only as told by your doctor.  Do not take any medicines that have aspirin or NSAIDs in them. These medicines can thin your blood and cause you to bleed.  Tell all of your doctors that you have this condition. Be sure to tell your dentist and eye doctor too.   Contact a doctor if:  You have bruises and you do not know why. Get help right away if:  You are bleeding anywhere on your body.  You have blood in your spit, pee, or poop. Summary  Thrombocytopenia means that you have a low number of platelets in your blood.  Platelets are needed for blood clotting.  Symptoms of this condition include bleeding that is not normal, and bruising.  Take care not to cut or burn yourself. This information is not intended to replace advice given to you by your health care provider. Make sure you discuss any questions you have with your health care provider. Document Revised: 10/18/2017 Document Reviewed: 10/18/2017 Elsevier Patient Education  [place].

## 2020-03-11 NOTE — Patient Instructions (Signed)

## 2020-03-13 ENCOUNTER — Inpatient Hospital Stay: Payer: Medicare Other

## 2020-03-13 ENCOUNTER — Other Ambulatory Visit: Payer: Self-pay

## 2020-03-13 VITALS — BP 128/58 | HR 95 | Temp 98.1°F | Resp 18

## 2020-03-13 DIAGNOSIS — Z5111 Encounter for antineoplastic chemotherapy: Secondary | ICD-10-CM | POA: Diagnosis not present

## 2020-03-13 DIAGNOSIS — C20 Malignant neoplasm of rectum: Secondary | ICD-10-CM

## 2020-03-13 MED ORDER — SODIUM CHLORIDE 0.9% FLUSH
10.0000 mL | INTRAVENOUS | Status: DC | PRN
  Administered 2020-03-13: 10 mL
  Filled 2020-03-13: qty 10

## 2020-03-13 MED ORDER — HEPARIN SOD (PORK) LOCK FLUSH 100 UNIT/ML IV SOLN
500.0000 [IU] | Freq: Once | INTRAVENOUS | Status: AC | PRN
  Administered 2020-03-13: 500 [IU]
  Filled 2020-03-13: qty 5

## 2020-03-13 NOTE — Patient Instructions (Signed)

## 2020-03-21 ENCOUNTER — Other Ambulatory Visit: Payer: Self-pay | Admitting: Oncology

## 2020-03-23 IMAGING — MR MR PELVIS WO/W CM
7 of 8 series · 43 of 48 positions shown · IV contrast (20ml Multihance)
Comparison: None.

CLINICAL DATA: Newly diagnosed rectal carcinoma.

EXAM:
MRI PELVIS WITHOUT AND WITH CONTRAST
TECHNIQUE: Multiplanar multisequence MR imaging of the pelvis was performed
both before and after administration of intravenous contrast. Small
amount of US gel was administered per rectum to optimize tumor
evaluation.
CONTRAST:  20mL MULTIHANCE GADOBENATE DIMEGLUMINE 529 MG/ML IV SOLN

[Series 6: t2_tse_sag_2 · sagittal · 4.0mm · 0.62mm/px · 5 of 28 slices shown]
[im 1/28]
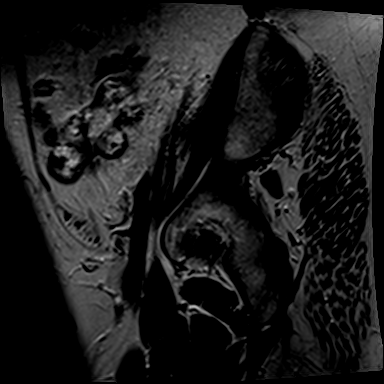
[im 7/28]
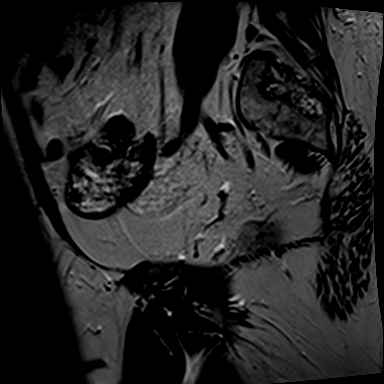
[im 14/28]
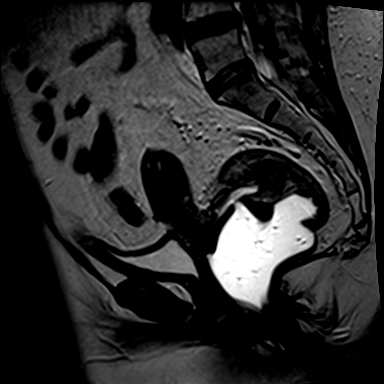
[im 21/28]
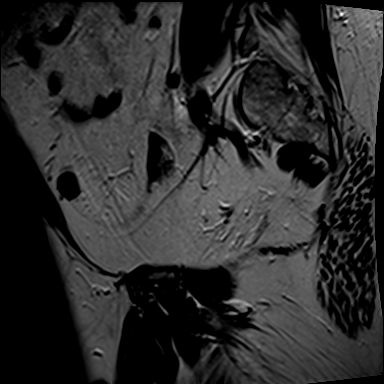
[im 28/28]
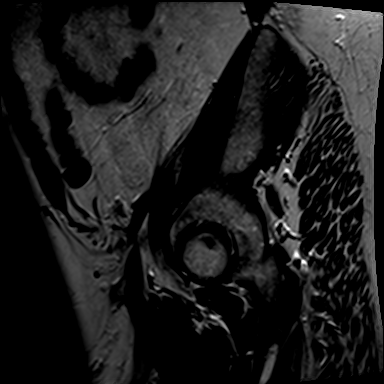

[Series 7: t1_ axial fs · axial · 4.0mm · 1.56mm/px · z∈[-16,+239]mm · 8 of 50 slices shown]
[im 1/50]
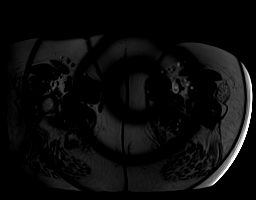
[im 8/50]
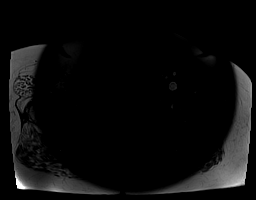
[im 15/50]
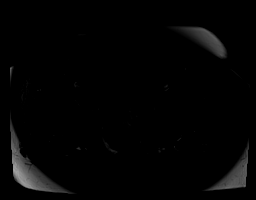
[im 22/50]
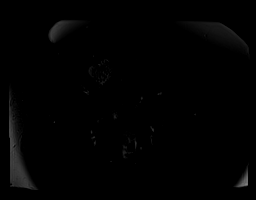
[im 29/50]
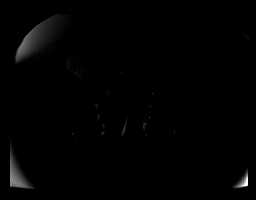
[im 36/50]
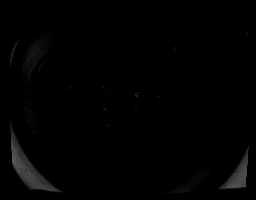
[im 43/50]
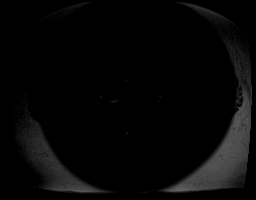
[im 50/50]
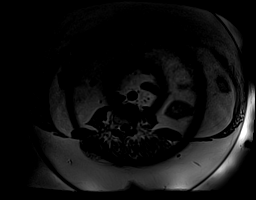

[Series 8: t2_tse axial pelvis_2 · axial · 4.0mm · 1.56mm/px · z∈[-16,+239]mm · 8 of 50 slices shown]
[im 1/50]
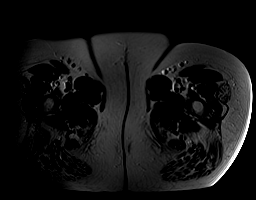
[im 8/50]
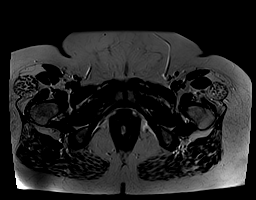
[im 15/50]
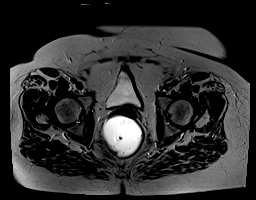
[im 22/50]
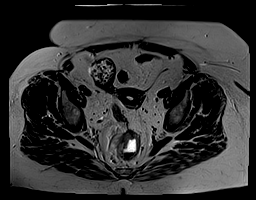
[im 29/50]
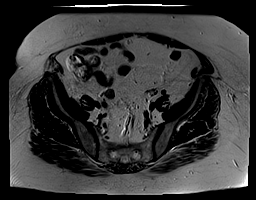
[im 36/50]
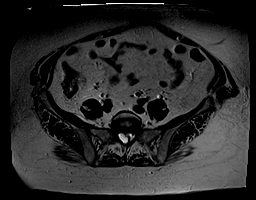
[im 43/50]
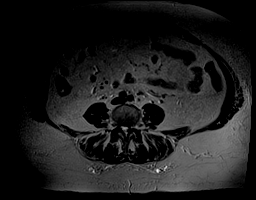
[im 50/50]
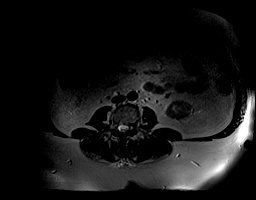

[Series 9: t2_tse axial obl · coronal · 4.0mm · 0.75mm/px · 5 of 28 slices shown]
[im 1/28]
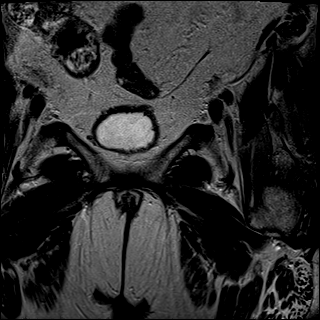
[im 7/28]
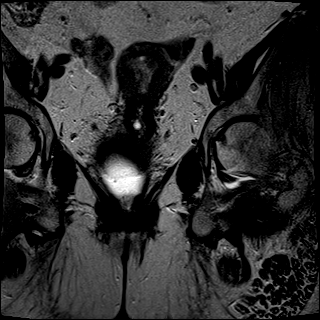
[im 14/28]
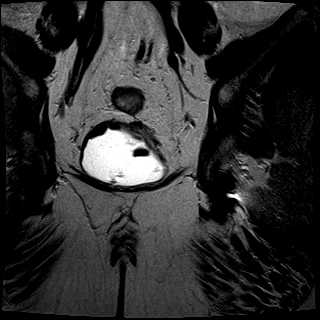
[im 21/28]
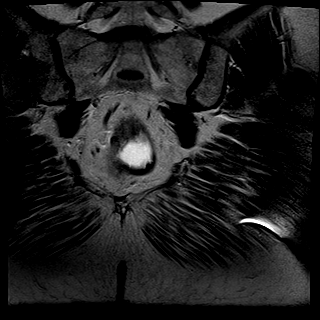
[im 28/28]
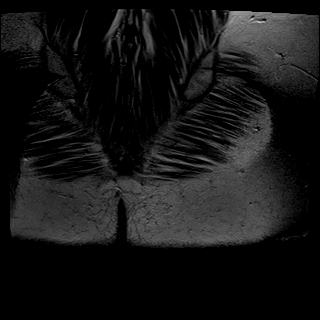

[Series 10: T2 · axial · 3.0mm · 0.38mm/px · z∈[-52,+50]mm · 5 of 30 slices shown]
[im 1/30]
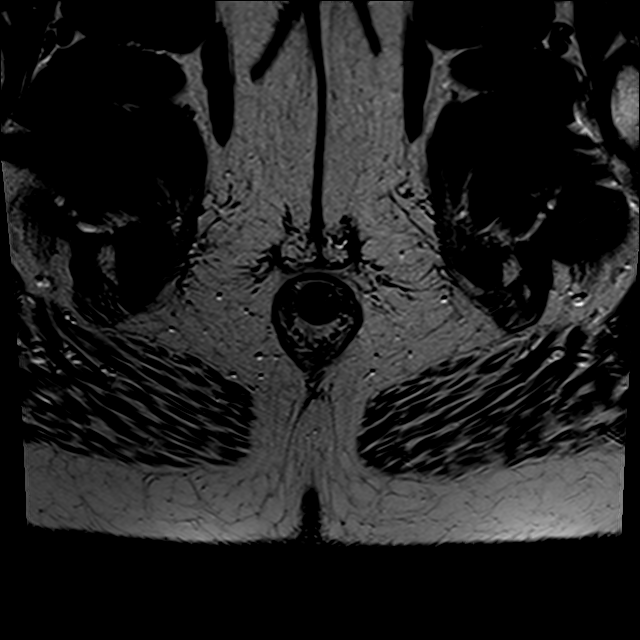
[im 8/30]
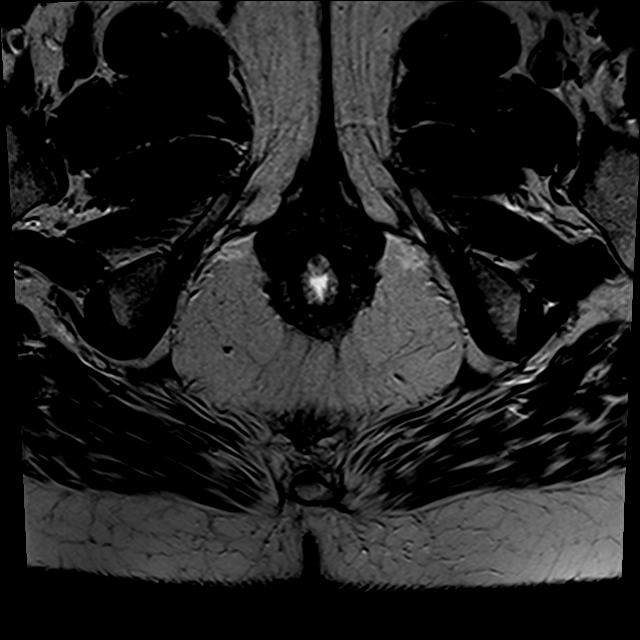
[im 15/30]
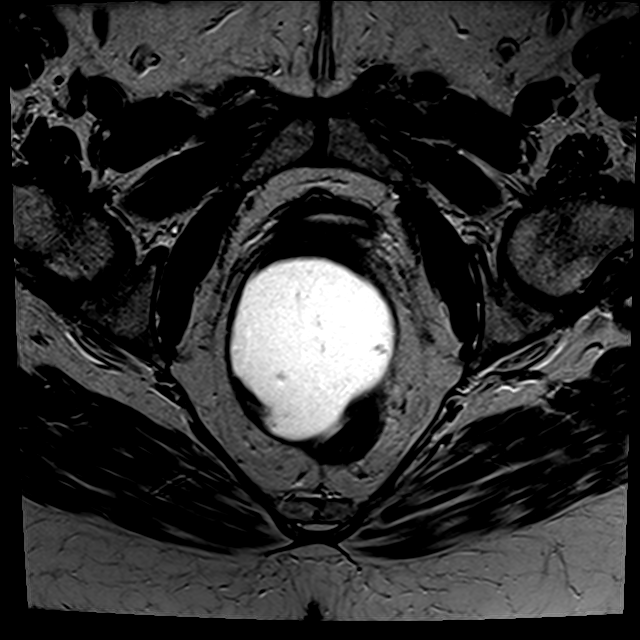
[im 22/30]
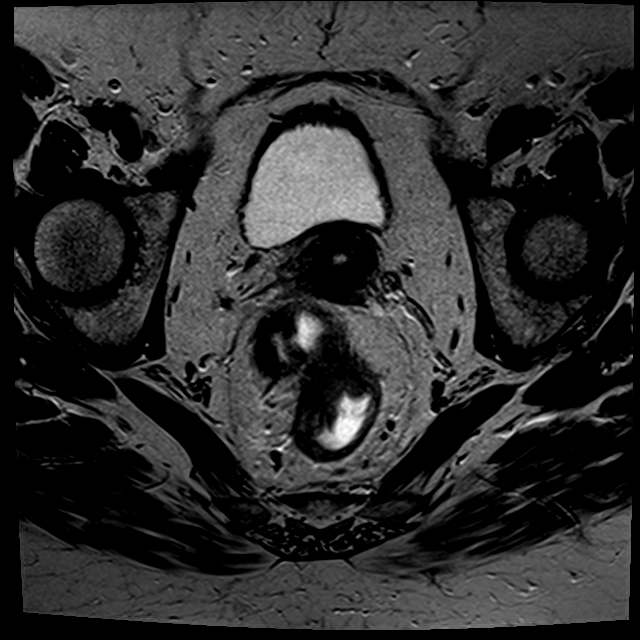
[im 30/30]
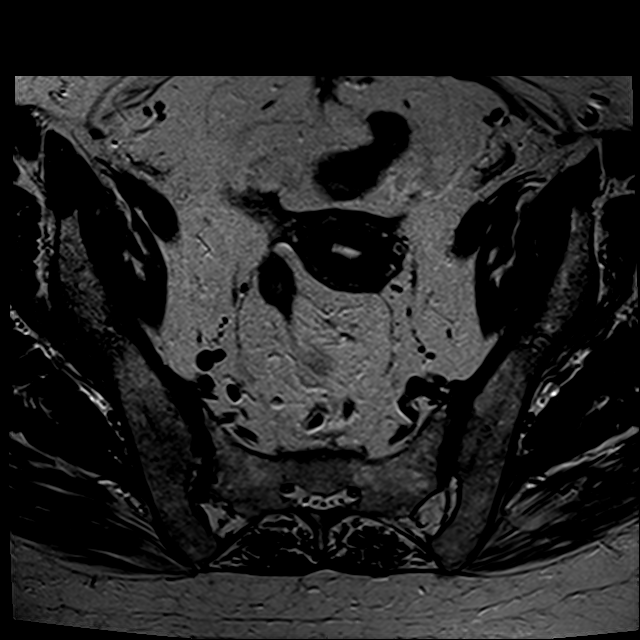

[Series 11: t1_ axial post · axial · 4.0mm · 1.56mm/px · z∈[-86,+169]mm · 8 of 50 slices shown]
[im 1/50]
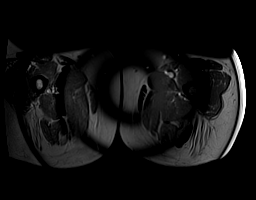
[im 8/50]
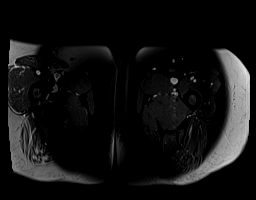
[im 15/50]
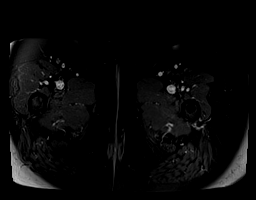
[im 22/50]
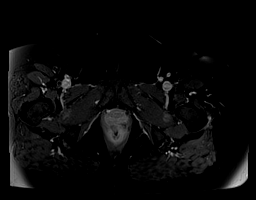
[im 29/50]
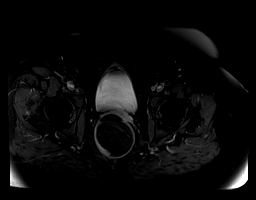
[im 36/50]
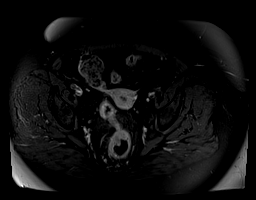
[im 43/50]
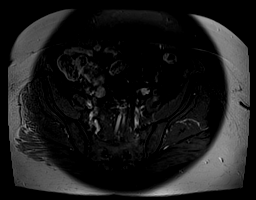
[im 50/50]
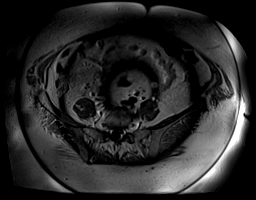

[Series 12: post cor fs · axial · 3.0mm · 0.94mm/px · z∈[-52,+22]mm · 4 of 30 slices shown]
[im 1/30]
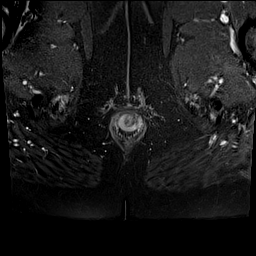
[im 8/30]
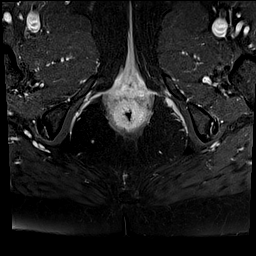
[im 15/30]
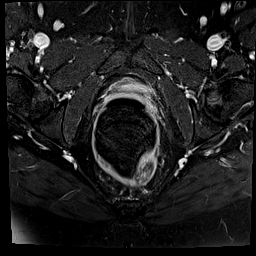
[im 22/30]
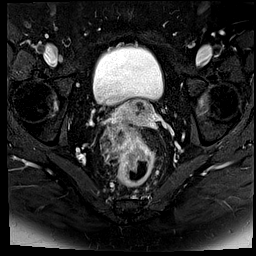

[43 of 48 positions shown; findings below may reference images not displayed]

FINDINGS: TUMOR LOCATION

Location from Anal Verge: Middle third

Shortest Distance from Tumor to Anal Sphincter: 6.3 cm

TUMOR DESCRIPTION

Circumferential Extent: 100%

Tumor Length: 3.7 cm

T - CATEGORY

Extension through Muscularis Propria: Yes, measuring approximately 4
mm = T3b

Shortest Distance of any tumor/node from Mesorectal Fascia: 12 mm

Extramural Vascular Invasion/Tumor Thrombus:  No

Invasion of Anterior Peritoneal Reflection:  No

Involvement of Adjacent Organs or Pelvic Sidewall: No

Levator Ani Involvement:  No

N - CATEGORY

Mesorectal Lymph Nodes >=5mm: None=N0

Extra-mesorectal Lymphadenopathy:  No/Yes = N2

Other:  None.
IMPRESSION: Rectal adenocarcinoma T stage:  T3b

Rectal adenocarcinoma N stage:  N0

Distance from tumor to the anal sphincter is 6.3 cm.

## 2020-03-24 MED FILL — Dexamethasone Sodium Phosphate Inj 100 MG/10ML: INTRAMUSCULAR | Qty: 1 | Status: AC

## 2020-03-25 ENCOUNTER — Inpatient Hospital Stay (HOSPITAL_BASED_OUTPATIENT_CLINIC_OR_DEPARTMENT_OTHER): Payer: Medicare Other | Admitting: Nurse Practitioner

## 2020-03-25 ENCOUNTER — Inpatient Hospital Stay: Payer: Medicare Other

## 2020-03-25 ENCOUNTER — Other Ambulatory Visit: Payer: Self-pay

## 2020-03-25 ENCOUNTER — Encounter: Payer: Self-pay | Admitting: Nurse Practitioner

## 2020-03-25 VITALS — BP 143/74 | HR 90 | Temp 97.8°F | Resp 18 | Ht 63.0 in | Wt 206.5 lb

## 2020-03-25 DIAGNOSIS — Z5111 Encounter for antineoplastic chemotherapy: Secondary | ICD-10-CM | POA: Diagnosis not present

## 2020-03-25 DIAGNOSIS — C20 Malignant neoplasm of rectum: Secondary | ICD-10-CM

## 2020-03-25 LAB — CMP (CANCER CENTER ONLY)
ALT: 13 U/L (ref 0–44)
AST: 16 U/L (ref 15–41)
Albumin: 3.1 g/dL — ABNORMAL LOW (ref 3.5–5.0)
Alkaline Phosphatase: 46 U/L (ref 38–126)
Anion gap: 11 (ref 5–15)
BUN: 23 mg/dL (ref 8–23)
CO2: 25 mmol/L (ref 22–32)
Calcium: 8.6 mg/dL — ABNORMAL LOW (ref 8.9–10.3)
Chloride: 107 mmol/L (ref 98–111)
Creatinine: 0.78 mg/dL (ref 0.44–1.00)
GFR, Estimated: >60 mL/min (ref >60)
Glucose, Bld: 88 mg/dL (ref 70–99)
Potassium: 3.7 mmol/L (ref 3.5–5.1)
Sodium: 143 mmol/L (ref 135–145)
Total Bilirubin: 0.8 mg/dL (ref 0.3–1.2)
Total Protein: 6.1 g/dL — ABNORMAL LOW (ref 6.5–8.1)

## 2020-03-25 LAB — CBC WITH DIFFERENTIAL (CANCER CENTER ONLY)
Abs Immature Granulocytes: 0.08 10*3/uL — ABNORMAL HIGH (ref 0.00–0.07)
Basophils Absolute: 0 10*3/uL (ref 0.0–0.1)
Basophils Relative: 0 %
Eosinophils Absolute: 0.1 10*3/uL (ref 0.0–0.5)
Eosinophils Relative: 1 %
HCT: 33.5 % — ABNORMAL LOW (ref 36.0–46.0)
Hemoglobin: 11.3 g/dL — ABNORMAL LOW (ref 12.0–15.0)
Immature Granulocytes: 1 %
Lymphocytes Relative: 11 %
Lymphs Abs: 0.8 10*3/uL (ref 0.7–4.0)
MCH: 35.1 pg — ABNORMAL HIGH (ref 26.0–34.0)
MCHC: 33.7 g/dL (ref 30.0–36.0)
MCV: 104 fL — ABNORMAL HIGH (ref 80.0–100.0)
Monocytes Absolute: 0.8 10*3/uL (ref 0.1–1.0)
Monocytes Relative: 11 %
Neutro Abs: 5.3 10*3/uL (ref 1.7–7.7)
Neutrophils Relative %: 76 %
Platelet Count: 75 10*3/uL — ABNORMAL LOW (ref 150–400)
RBC: 3.22 MIL/uL — ABNORMAL LOW (ref 3.87–5.11)
RDW: 15 % (ref 11.5–15.5)
WBC Count: 7 10*3/uL (ref 4.0–10.5)
nRBC: 0 % (ref 0.0–0.2)

## 2020-03-25 MED ORDER — SODIUM CHLORIDE 0.9 % IV SOLN
1400.0000 mg/m2 | INTRAVENOUS | Status: DC
  Administered 2020-03-25: 2850 mg via INTRAVENOUS
  Filled 2020-03-25: qty 57

## 2020-03-25 MED ORDER — SODIUM CHLORIDE 0.9% FLUSH
10.0000 mL | INTRAVENOUS | Status: DC | PRN
  Filled 2020-03-25: qty 10

## 2020-03-25 MED ORDER — PREDNISONE 5 MG PO TABS: 15.0000 mg | ORAL_TABLET | Freq: Every day | ORAL | 0 refills | Status: DC

## 2020-03-25 NOTE — Patient Instructions (Signed)
[facility] Discharge Instructions for Patients Receiving Chemotherapy  Today you received the following chemotherapy agent: Fluorouracil  To help prevent nausea and vomiting after your treatment, we encourage you to take your nausea medication as directed by your MD.   If you develop nausea and vomiting that is not controlled by your nausea medication, call the clinic.   BELOW ARE SYMPTOMS THAT SHOULD BE REPORTED IMMEDIATELY:  *FEVER GREATER THAN 100.5 F  *CHILLS WITH OR WITHOUT FEVER  NAUSEA AND VOMITING THAT IS NOT CONTROLLED WITH YOUR NAUSEA MEDICATION  *UNUSUAL SHORTNESS OF BREATH  *UNUSUAL BRUISING OR BLEEDING  TENDERNESS IN MOUTH AND THROAT WITH OR WITHOUT PRESENCE OF ULCERS  *URINARY PROBLEMS  *BOWEL PROBLEMS  UNUSUAL RASH Items with * indicate a potential emergency and should be followed up as soon as possible.  Feel free to call the clinic should you have any questions or concerns. The clinic phone number is [phone].  Please show the CHEMO ALERT CARD at check-in to the Emergency Department and triage nurse.  [facility] Discharge Instructions for Patients receiving Home Portable Chemo Pump    **The bag should finish at 46 hours, 96 hours or 7 days. For example, if your pump is scheduled for 46 hours and it was put on at 4pm, it should finish at 2 pm the day it is scheduled to come off regardless of your appointment time.    Estimated time to finish   _________________________ (Have your nurse fill in)     ** if the display on your pump reads "Low Volume" and it is beeping, take the batteries out of the pump and come to the cancer center for it to be taken off.   **If the pump alarms go off prior to the pump reading "Low Volume" then call the [phone] and someone can assist you.  **If the plunger comes out and the bag fluid is running out, please use your chemo spill kit to clean up the spill. Do not use paper towels or  other house hold products.  ** If you have problems or questions regarding your pump, please call either the [phone] or the cancer center Monday-Friday 8:00am-4:30pm at [phone] and we will assist you.  If you are unable to get assistance then go to [facility] Emergency Room, ask the staff to contact the IV team for assistance.    

## 2020-03-25 NOTE — Progress Notes (Signed)
[facility] OFFICE PROGRESS NOTE   Diagnosis: Rectal cancer  INTERVAL HISTORY:   Samantha Lutz returns as scheduled.  She completed another cycle of 5-FU 03/11/2020.  She denies nausea/vomiting.  No mouth sores.  No diarrhea.  She has persistent neuropathy symptoms, painful at nighttime.  Cough and dyspnea have improved coinciding with prednisone 20 mg daily.  Objective:  Vital signs in last 24 hours:  Blood pressure (!) 143/74, pulse 90, temperature 97.8 F (36.6 C), temperature source Tympanic, resp. rate 18, height 5' 3" (1.6 m), weight 206 lb 8 oz (93.7 kg), SpO2 98 %.    HEENT: No thrush or ulcers. Resp: Lungs clear bilaterally.  No wheezes. Cardio: Regular rate and rhythm. GI: No hepatomegaly. Vascular: No leg edema. Skin: Palms without erythema. Port-A-Cath without erythema.  Lab Results:  Lab Results  Component Value Date   WBC 7.0 03/25/2020   HGB 11.3 (L) 03/25/2020   HCT 33.5 (L) 03/25/2020   MCV 104.0 (H) 03/25/2020   PLT 75 (L) 03/25/2020   NEUTROABS 5.3 03/25/2020    Imaging:  No results found.  Medications: I have reviewed the patient's current medications.  Assessment/Plan: 1. Rectal cancer ? Mass at 7 cm from the anal verge on colonoscopy 08/09/2017, biopsy revealed invasive adenocarcinoma ? Staging CTs 08/17/2017-no evidence of metastatic disease, asymmetric thickening in the mid rectum ? MR pelvis 09/01/2017, T3N0 lesion beginning at 6.3 cm from the anal sphincter ? Radiation/Xeloda initiated 09/17/2017, completed 10/25/2017 ? Low anterior resection/diverting ileostomy 12/21/2017,ypT3,ypN1atumor. Lymphovascular invasion present, intact mismatch repair protein expression, treatment effect present (TRS 1). Mismatch repair protein IHC normal;Foundation 1-KRAS/NRAS wild-type, microsatellite status and tumor mutational burden could not be determined. ? Cycle 1 adjuvant Xeloda beginning 01/21/2018 ? Cycle 2 adjuvant Xeloda beginning  02/11/2018 ? Xeloda discontinued after cycle 2 secondary to patient preference ? Ileostomy takedown 07/10/2018 ? CTs 09/07/2018-multiple live small pulmonary nodules concerning for metastatic disease ? CT chest 12/10/2018-multiple bilateral lung nodules, some have increased in size ? CT chest 04/08/2019-mild enlargement of bilateral lung nodules ? Status post SBRT multiple lung nodules 05/06/2019, 05/08/2019, 05/13/2019 ? CT chest 08/28/2019-improvement and resolution in majority of right-sided pulmonary nodules, a superior segment right lower lobe nodule has increased, no new right-sided nodules. Progressive enlargement of left-sided pulmonary nodules ? PET scan 09/09/2019-hypermetabolic right lower lobe nodule, 2 hypermetabolic left upper lobe nodules with an additional 0.7 cm left upper lobe nodule below PET resolution, groundglass opacity in the mid to right lower lobe with associated hypermetabolism consistent with postradiation change, hypermetabolic central [id number] lesion ? Cycle 1 FOLFOX 10/14/2019 ? Cycle 2FOLFOX 11/11/2019, 5-FU and oxaliplatin dose reduced secondary to neutropenia and thrombocytopenia following cycle one, G-CSF declined ? Cycle 3 FOLFOX 11/26/2019 ? Cycle 4 FOLFOX 12/11/2019, oxaliplatin held due to neutropenia and thrombocytopenia ? Cycle 5 FOLFOX 12/31/2019 ? Cycle 6 FOLFOX 01/14/2020 (oxaliplatin held, 5-fluorouracil dose reduced due to mucositis) ? CT chest 01/27/2020-improvement in left lung nodules, progressive airspace disease with traction bronchiectasis throughout the right lung, previously noted right lung nodules are obscured, mildly enlarged right paratracheal lymph nodes-not hypermetabolic on prior PET, likely reactive ? Cycle 7 FOLFOX 01/28/2020 ? Cycle 8 FOLFOX 02/11/2020 (oxaliplatin held due to neutropenia and thrombocytopenia) ? Cycle 9 FOLFOX 02/25/2020 (oxaliplatin held due to thrombocytopenia) ? Cycle 10 FOLFOX 03/11/2020 (oxaliplatin held secondary to  neuropathy) ? Cycle 11 FOLFOX 03/25/2020 (oxaliplatin held due to neuropathy)  2.Hypothyroid  3. History of mild thrombocytopenia secondary to chemotherapy and radiation  4.Port-A-Cath placement 09/29/2019, interventional  radiology  5. Neutropenia and thrombocytopenia following cycle 1 FOLFOX-plan chemotherapy dose reductions, she declined G-CSF  6. Mucositis following cycle 5 FOLFOX, 5-fluorouracil dose reduced with cycle 6  7.Right lung airspace disease/volume loss-likely toxicity from chest radiation, trial of prednisone 01/29/2020;dyspnea and cough improved 02/11/2020  Progressive cough 03/11/2020-prednisone resumed at a dose of 20 mg daily  Cough and dyspnea improved 03/25/2020-prednisone taper to 15 mg daily  8.  Oxaliplatin neuropathy   Disposition: Samantha Lutz appears stable.  She completed another cycle of 5-FU 03/11/2020.  She has persistent neuropathy symptoms.  Plan to proceed with 5-FU alone today as scheduled, continue to hold oxaliplatin.  Restaging chest CT prior to next office visit.  Neuropathy symptoms are painful at times.  We discussed a trial of gabapentin.  She declines this for now.  We reviewed the CBC from today.  Counts adequate to proceed with treatment.  She has stable moderate thrombocytopenia.  She will contact the office with bleeding.  Cough and dyspnea improved since prednisone resumed.  She will taper the prednisone to 15 mg daily.  She will return for lab, follow-up, chemotherapy in 2 weeks with restaging chest CT a few days prior.  She will contact the office in the interim with any problems.  We specifically discussed worsening cough/dyspnea.  Plan reviewed with Dr. Benay Spice.    Ned Card ANP/GNP-BC   03/25/2020  10:25 AM

## 2020-03-26 ENCOUNTER — Telehealth: Payer: Self-pay | Admitting: Nurse Practitioner

## 2020-03-26 NOTE — Telephone Encounter (Signed)
Scheduled appointments per 2/24 los. Called patient, no answer. Left message with appointments dates and times.

## 2020-03-27 ENCOUNTER — Inpatient Hospital Stay: Payer: Medicare Other

## 2020-03-27 ENCOUNTER — Other Ambulatory Visit: Payer: Self-pay

## 2020-03-27 VITALS — BP 152/58 | HR 79 | Temp 97.6°F | Resp 18

## 2020-03-27 DIAGNOSIS — C20 Malignant neoplasm of rectum: Secondary | ICD-10-CM

## 2020-03-27 DIAGNOSIS — Z5111 Encounter for antineoplastic chemotherapy: Secondary | ICD-10-CM | POA: Diagnosis not present

## 2020-03-27 MED ORDER — SODIUM CHLORIDE 0.9% FLUSH
10.0000 mL | INTRAVENOUS | Status: DC | PRN
  Administered 2020-03-27: 10 mL
  Filled 2020-03-27: qty 10

## 2020-03-27 MED ORDER — HEPARIN SOD (PORK) LOCK FLUSH 100 UNIT/ML IV SOLN
500.0000 [IU] | Freq: Once | INTRAVENOUS | Status: AC | PRN
  Administered 2020-03-27: 500 [IU]
  Filled 2020-03-27: qty 5

## 2020-04-04 ENCOUNTER — Other Ambulatory Visit: Payer: Self-pay | Admitting: Oncology

## 2020-04-05 ENCOUNTER — Other Ambulatory Visit: Payer: Self-pay

## 2020-04-05 ENCOUNTER — Ambulatory Visit (HOSPITAL_COMMUNITY)
Admission: RE | Admit: 2020-04-05 | Discharge: 2020-04-05 | Disposition: A | Discharge status: Home / Self Care | Payer: Medicare Other | Source: Ambulatory Visit | Attending: Nurse Practitioner | Admitting: Nurse Practitioner

## 2020-04-05 DIAGNOSIS — C20 Malignant neoplasm of rectum: Secondary | ICD-10-CM | POA: Insufficient documentation

## 2020-04-05 MED FILL — Dexamethasone Sodium Phosphate Inj 100 MG/10ML: INTRAMUSCULAR | Qty: 1 | Status: AC

## 2020-04-06 ENCOUNTER — Inpatient Hospital Stay: Payer: Medicare Other | Attending: Oncology

## 2020-04-06 ENCOUNTER — Inpatient Hospital Stay (HOSPITAL_BASED_OUTPATIENT_CLINIC_OR_DEPARTMENT_OTHER): Payer: Medicare Other | Admitting: Nurse Practitioner

## 2020-04-06 ENCOUNTER — Inpatient Hospital Stay: Payer: Medicare Other

## 2020-04-06 ENCOUNTER — Other Ambulatory Visit: Payer: Self-pay

## 2020-04-06 ENCOUNTER — Encounter: Payer: Self-pay | Admitting: Nurse Practitioner

## 2020-04-06 VITALS — BP 173/78 | HR 81 | Temp 97.4°F | Resp 18 | Ht 63.0 in | Wt 210.4 lb

## 2020-04-06 DIAGNOSIS — Z95828 Presence of other vascular implants and grafts: Secondary | ICD-10-CM

## 2020-04-06 DIAGNOSIS — C20 Malignant neoplasm of rectum: Secondary | ICD-10-CM | POA: Diagnosis present

## 2020-04-06 DIAGNOSIS — Z5111 Encounter for antineoplastic chemotherapy: Secondary | ICD-10-CM | POA: Diagnosis not present

## 2020-04-06 DIAGNOSIS — G620 Drug-induced polyneuropathy: Secondary | ICD-10-CM | POA: Diagnosis not present

## 2020-04-06 DIAGNOSIS — Z79899 Other long term (current) drug therapy: Secondary | ICD-10-CM | POA: Insufficient documentation

## 2020-04-06 DIAGNOSIS — T451X5A Adverse effect of antineoplastic and immunosuppressive drugs, initial encounter: Secondary | ICD-10-CM | POA: Diagnosis not present

## 2020-04-06 DIAGNOSIS — Z923 Personal history of irradiation: Secondary | ICD-10-CM | POA: Diagnosis not present

## 2020-04-06 DIAGNOSIS — E039 Hypothyroidism, unspecified: Secondary | ICD-10-CM | POA: Diagnosis not present

## 2020-04-06 DIAGNOSIS — D6959 Other secondary thrombocytopenia: Secondary | ICD-10-CM | POA: Insufficient documentation

## 2020-04-06 LAB — CBC WITH DIFFERENTIAL (CANCER CENTER ONLY)
Abs Immature Granulocytes: 0.04 10*3/uL (ref 0.00–0.07)
Basophils Absolute: 0 10*3/uL (ref 0.0–0.1)
Basophils Relative: 0 %
Eosinophils Absolute: 0.1 10*3/uL (ref 0.0–0.5)
Eosinophils Relative: 2 %
HCT: 33.3 % — ABNORMAL LOW (ref 36.0–46.0)
Hemoglobin: 11 g/dL — ABNORMAL LOW (ref 12.0–15.0)
Immature Granulocytes: 1 %
Lymphocytes Relative: 11 %
Lymphs Abs: 0.5 10*3/uL — ABNORMAL LOW (ref 0.7–4.0)
MCH: 35.3 pg — ABNORMAL HIGH (ref 26.0–34.0)
MCHC: 33 g/dL (ref 30.0–36.0)
MCV: 106.7 fL — ABNORMAL HIGH (ref 80.0–100.0)
Monocytes Absolute: 0.5 10*3/uL (ref 0.1–1.0)
Monocytes Relative: 11 %
Neutro Abs: 3.6 10*3/uL (ref 1.7–7.7)
Neutrophils Relative %: 75 %
Platelet Count: 70 10*3/uL — ABNORMAL LOW (ref 150–400)
RBC: 3.12 MIL/uL — ABNORMAL LOW (ref 3.87–5.11)
RDW: 14.6 % (ref 11.5–15.5)
WBC Count: 4.8 10*3/uL (ref 4.0–10.5)
nRBC: 0 % (ref 0.0–0.2)

## 2020-04-06 LAB — CMP (CANCER CENTER ONLY)
ALT: 15 U/L (ref 0–44)
AST: 16 U/L (ref 15–41)
Albumin: 3.1 g/dL — ABNORMAL LOW (ref 3.5–5.0)
Alkaline Phosphatase: 43 U/L (ref 38–126)
Anion gap: 7 (ref 5–15)
BUN: 22 mg/dL (ref 8–23)
CO2: 23 mmol/L (ref 22–32)
Calcium: 8.5 mg/dL — ABNORMAL LOW (ref 8.9–10.3)
Chloride: 110 mmol/L (ref 98–111)
Creatinine: 0.82 mg/dL (ref 0.44–1.00)
GFR, Estimated: >60 mL/min (ref >60)
Glucose, Bld: 135 mg/dL — ABNORMAL HIGH (ref 70–99)
Potassium: 3.6 mmol/L (ref 3.5–5.1)
Sodium: 140 mmol/L (ref 135–145)
Total Bilirubin: 0.6 mg/dL (ref 0.3–1.2)
Total Protein: 6.1 g/dL — ABNORMAL LOW (ref 6.5–8.1)

## 2020-04-06 MED ORDER — SODIUM CHLORIDE 0.9% FLUSH
10.0000 mL | Freq: Once | INTRAVENOUS | Status: AC
  Administered 2020-04-06: 10 mL
  Filled 2020-04-06: qty 10

## 2020-04-06 MED ORDER — SODIUM CHLORIDE 0.9 % IV SOLN
1400.0000 mg/m2 | INTRAVENOUS | Status: DC
  Administered 2020-04-06: 2850 mg via INTRAVENOUS
  Filled 2020-04-06: qty 57

## 2020-04-06 NOTE — Progress Notes (Signed)
Ok to treat with platelets of 70 per Ned Card, NP.

## 2020-04-06 NOTE — Progress Notes (Addendum)
[facility] OFFICE PROGRESS NOTE   Diagnosis: Rectal cancer  INTERVAL HISTORY:   Samantha Lutz returns as scheduled.  She completed another cycle of 5-fluorouracil 03/25/2020.  She denies nausea/vomiting.  No mouth sores.  No diarrhea.  No hand or foot pain or redness.  Cough and dyspnea continue to improve.  Current prednisone dose is 15 mg daily.  She has persistent neuropathy symptoms, painful at nighttime.  She denies bleeding.  Objective:  Vital signs in last 24 hours:  Blood pressure (!) 173/78, pulse 81, temperature (!) 97.4 F (36.3 C), temperature source Tympanic, resp. rate 18, height '5\' 3"'  (1.6 m), weight 210 lb 6.4 oz (95.4 kg), SpO2 95 %.    HEENT: No thrush or ulcers. Resp: Lungs with faint end inspiratory wheeze right lung field.  No respiratory distress. Cardio: Regular rate and rhythm. GI: Abdomen soft and nontender.  No hepatomegaly. Vascular: No leg edema.  Skin: Palms without erythema. Port-A-Cath without erythema.   Lab Results:  Lab Results  Component Value Date   WBC 4.8 04/06/2020   HGB 11.0 (L) 04/06/2020   HCT 33.3 (L) 04/06/2020   MCV 106.7 (H) 04/06/2020   PLT 70 (L) 04/06/2020   NEUTROABS 3.6 04/06/2020    Imaging:  CT Chest Wo Contrast  Result Date: 04/05/2020 CLINICAL DATA:  Colon cancer with lung mass.  Restaging. EXAM: CT CHEST WITHOUT CONTRAST TECHNIQUE: Multidetector CT imaging of the chest was performed following the standard protocol without IV contrast. COMPARISON:  01/27/2020 FINDINGS: Cardiovascular: The heart size is normal. No substantial pericardial effusion. Coronary artery calcification is evident. Atherosclerotic calcification is noted in the wall of the thoracic aorta. Right Port-A-Cath tip is positioned in the low right atrium. Mediastinum/Nodes: No mediastinal lymphadenopathy. No evidence for gross hilar lymphadenopathy although assessment is limited by the lack of intravenous contrast on today's study. The  esophagus has normal imaging features. There is no axillary lymphadenopathy. Lungs/Pleura: Interval evolution of presumed post radiation scarring in the right parahilar lung with decrease in the more diffuse ground-glass opacity seen previously. Traction bronchiectasis persists, most prominently in the infrahilar right lower lobe where there is associated volume loss. 6 mm left upper lobe nodule on 55/5 is similar to prior compared to 8 mm previously. 6 mm posterior lingular nodule is similar on 71/5 compared to 5 mm previously. No pleural effusion. Upper Abdomen: Calcified gallstones evident. Musculoskeletal: No worrisome lytic or sclerotic osseous abnormality. IMPRESSION: 1. No new or progressive findings on today's study. 2. Interval evolution of presumed post radiation scarring in the right parahilar lung with decrease in the more diffuse ground-glass opacity seen previously. 3. No substantial change in left lung nodules. 4. Cholelithiasis. 5. Aortic Atherosclerosis (ICD10-I70.0). Electronically Signed   By: Misty Stanley M.D.   On: 04/05/2020 21:04    Medications: I have reviewed the patient's current medications.  Assessment/Plan: 1. Rectal cancer ? Mass at 7 cm from the anal verge on colonoscopy 08/09/2017, biopsy revealed invasive adenocarcinoma ? Staging CTs 08/17/2017-no evidence of metastatic disease, asymmetric thickening in the mid rectum ? MR pelvis 09/01/2017, T3N0 lesion beginning at 6.3 cm from the anal sphincter ? Radiation/Xeloda initiated 09/17/2017, completed 10/25/2017 ? Low anterior resection/diverting ileostomy 12/21/2017,ypT3,ypN1atumor. Lymphovascular invasion present, intact mismatch repair protein expression, treatment effect present (TRS 1). Mismatch repair protein IHC normal;Foundation 1-KRAS/NRAS wild-type, microsatellite status and tumor mutational burden could not be determined. ? Cycle 1 adjuvant Xeloda beginning 01/21/2018 ? Cycle 2 adjuvant Xeloda beginning  02/11/2018 ? Xeloda discontinued after  cycle 2 secondary to patient preference ? Ileostomy takedown 07/10/2018 ? CTs 09/07/2018-multiple live small pulmonary nodules concerning for metastatic disease ? CT chest 12/10/2018-multiple bilateral lung nodules, some have increased in size ? CT chest 04/08/2019-mild enlargement of bilateral lung nodules ? Status post SBRT multiple lung nodules 05/06/2019, 05/08/2019, 05/13/2019 ? CT chest 08/28/2019-improvement and resolution in majority of right-sided pulmonary nodules, a superior segment right lower lobe nodule has increased, no new right-sided nodules. Progressive enlargement of left-sided pulmonary nodules ? PET scan 09/09/2019-hypermetabolic right lower lobe nodule, 2 hypermetabolic left upper lobe nodules with an additional 0.7 cm left upper lobe nodule below PET resolution, groundglass opacity in the mid to right lower lobe with associated hypermetabolism consistent with postradiation change, hypermetabolic central [id number] lesion ? Cycle 1 FOLFOX 10/14/2019 ? Cycle 2FOLFOX 11/11/2019, 5-FU and oxaliplatin dose reduced secondary to neutropenia and thrombocytopenia following cycle one, G-CSF declined ? Cycle 3 FOLFOX 11/26/2019 ? Cycle 4 FOLFOX 12/11/2019, oxaliplatin held due to neutropenia and thrombocytopenia ? Cycle 5 FOLFOX 12/31/2019 ? Cycle 6 FOLFOX 01/14/2020 (oxaliplatin held, 5-fluorouracil dose reduced due to mucositis) ? CT chest 01/27/2020-improvement in left lung nodules, progressive airspace disease with traction bronchiectasis throughout the right lung, previously noted right lung nodules are obscured, mildly enlarged right paratracheal lymph nodes-not hypermetabolic on prior PET, likely reactive ? Cycle 7 FOLFOX 01/28/2020 ? Cycle 8 FOLFOX 02/11/2020 (oxaliplatin held due to neutropenia and thrombocytopenia) ? Cycle 9 FOLFOX 02/25/2020 (oxaliplatin held due to thrombocytopenia) ? Cycle 10 FOLFOX 03/11/2020 (oxaliplatin held secondary to  neuropathy) ? Cycle 11 FOLFOX 03/25/2020 (oxaliplatin held due to neuropathy) ? CT chest 04/05/2020-no new or progressive findings.  Interval evolution of presumed postradiation scarring in the right perihilar lung with decrease in the more diffuse groundglass opacity seen previously.  No substantial change in left lung nodules. ? Cycle 12 5-fluorouracil  2.Hypothyroid  3. 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 Neutropenia and thrombocytopenia following cycle 1 FOLFOX-plan chemotherapy dose reductions, she declined G-CSF  6. Mucositis following cycle 5 FOLFOX, 5-fluorouracil dose reduced with cycle 6  7.Right lung airspace disease/volume loss-likely toxicity from chest radiation, trial of prednisone 01/29/2020;dyspnea and cough improved 02/11/2020  Progressive cough 03/11/2020-prednisone resumed at a dose of 20 mg daily  Cough and dyspnea improved 03/25/2020-prednisone taper to 15 mg daily  Improved 04/06/2020-prednisone taper to 10 mg daily  8.Oxaliplatin neuropathy   Disposition: Ms. Aveni appears stable.  She is currently on active treatment with every 2-week 5-fluorouracil by pump.  Recent restaging chest CT shows no significant change in left lung nodules, no new or progressive findings.  Samantha Lutz reviewed the report/images with her at today's visit and recommends continuation of every 2-week 5-fluorouracil by pump.  She agrees with this plan.  We reviewed the CBC from today.  Counts adequate to proceed with treatment.  She has stable moderate thrombocytopenia.  She understands to contact the office with bleeding.  She has persistent neuropathy symptoms, painful at nighttime.  We discussed a trial of gabapentin.  She declines this for now.  Cough and dyspnea continue to improve.  She will taper prednisone to 10 mg daily.  She will return for lab, follow-up,  5-FU in 2 weeks.  She will contact the office in the interim with any problems.  Patient seen with Samantha Lutz.    Samantha Lutz ANP/GNP-BC   04/06/2020  11:11 AM This was a shared visit with Samantha Lutz.  We reviewed the restaging  CT images with Ms. Hossain.  The radiation changes in the right lung appear improved.  She experience clinical improvement with prednisone.  We will continue a prednisone taper.  The left lung nodules appear unchanged.  I recommend continuing single agent 5-fluorouracil.  She agrees.  We will consider changing to a maintenance capecitabine regimen within the next few months.  I was present for greater than 50% of today's visit.  I performed medical decision making.  Samantha Manson, MD

## 2020-04-06 NOTE — Patient Instructions (Signed)
[facility] Discharge Instructions for Patients Receiving Chemotherapy  Today you received the following chemotherapy agent: Fluorouracil  To help prevent nausea and vomiting after your treatment, we encourage you to take your nausea medication as directed by your MD.   If you develop nausea and vomiting that is not controlled by your nausea medication, call the clinic.   BELOW ARE SYMPTOMS THAT SHOULD BE REPORTED IMMEDIATELY:  *FEVER GREATER THAN 100.5 F  *CHILLS WITH OR WITHOUT FEVER  NAUSEA AND VOMITING THAT IS NOT CONTROLLED WITH YOUR NAUSEA MEDICATION  *UNUSUAL SHORTNESS OF BREATH  *UNUSUAL BRUISING OR BLEEDING  TENDERNESS IN MOUTH AND THROAT WITH OR WITHOUT PRESENCE OF ULCERS  *URINARY PROBLEMS  *BOWEL PROBLEMS  UNUSUAL RASH Items with * indicate a potential emergency and should be followed up as soon as possible.  Feel free to call the clinic should you have any questions or concerns. The clinic phone number is [phone].  Please show the CHEMO ALERT CARD at check-in to the Emergency Department and triage nurse.  [facility] Discharge Instructions for Patients receiving Home Portable Chemo Pump    **The bag should finish at 46 hours, 96 hours or 7 days. For example, if your pump is scheduled for 46 hours and it was put on at 4pm, it should finish at 2 pm the day it is scheduled to come off regardless of your appointment time.    Estimated time to finish   _________________________ (Have your nurse fill in)     ** if the display on your pump reads "Low Volume" and it is beeping, take the batteries out of the pump and come to the cancer center for it to be taken off.   **If the pump alarms go off prior to the pump reading "Low Volume" then call the [phone] and someone can assist you.  **If the plunger comes out and the bag fluid is running out, please use your chemo spill kit to clean up the spill. Do not use paper towels or  other house hold products.  ** If you have problems or questions regarding your pump, please call either the [phone] or the cancer center Monday-Friday 8:00am-4:30pm at [phone] and we will assist you.  If you are unable to get assistance then go to [facility] Emergency Room, ask the staff to contact the IV team for assistance.    

## 2020-04-07 ENCOUNTER — Telehealth: Payer: Self-pay | Admitting: Nurse Practitioner

## 2020-04-07 NOTE — Telephone Encounter (Signed)
Scheduled appointments per 3/8 los. Called patient, no answer. Left message with appointments date and times.

## 2020-04-08 ENCOUNTER — Inpatient Hospital Stay: Payer: Medicare Other

## 2020-04-08 ENCOUNTER — Other Ambulatory Visit: Payer: Self-pay

## 2020-04-08 VITALS — BP 165/85 | HR 84 | Resp 18

## 2020-04-08 DIAGNOSIS — C20 Malignant neoplasm of rectum: Secondary | ICD-10-CM

## 2020-04-08 MED ORDER — SODIUM CHLORIDE 0.9% FLUSH
10.0000 mL | INTRAVENOUS | Status: DC | PRN
  Administered 2020-04-08: 10 mL
  Filled 2020-04-08: qty 10

## 2020-04-08 MED ORDER — HEPARIN SOD (PORK) LOCK FLUSH 100 UNIT/ML IV SOLN
500.0000 [IU] | Freq: Once | INTRAVENOUS | Status: AC | PRN
  Administered 2020-04-08: 500 [IU]
  Filled 2020-04-08: qty 5

## 2020-04-16 ENCOUNTER — Telehealth: Payer: Self-pay | Admitting: Oncology

## 2020-04-16 NOTE — Telephone Encounter (Signed)
Called and left detailed message regarding appointments being rescheduled from 4/7 to 4/5/

## 2020-04-18 ENCOUNTER — Other Ambulatory Visit: Payer: Self-pay | Admitting: Oncology

## 2020-04-21 ENCOUNTER — Telehealth: Payer: Self-pay | Admitting: Nurse Practitioner

## 2020-04-21 ENCOUNTER — Encounter: Payer: Self-pay | Admitting: Nurse Practitioner

## 2020-04-21 ENCOUNTER — Inpatient Hospital Stay: Payer: Medicare Other

## 2020-04-21 ENCOUNTER — Inpatient Hospital Stay (HOSPITAL_BASED_OUTPATIENT_CLINIC_OR_DEPARTMENT_OTHER): Payer: Medicare Other | Admitting: Nurse Practitioner

## 2020-04-21 ENCOUNTER — Other Ambulatory Visit: Payer: Self-pay

## 2020-04-21 VITALS — BP 145/88 | HR 94 | Temp 98.1°F | Resp 17 | Ht 63.0 in | Wt 209.8 lb

## 2020-04-21 DIAGNOSIS — C20 Malignant neoplasm of rectum: Secondary | ICD-10-CM

## 2020-04-21 DIAGNOSIS — Z95828 Presence of other vascular implants and grafts: Secondary | ICD-10-CM

## 2020-04-21 LAB — CBC WITH DIFFERENTIAL (CANCER CENTER ONLY)
Abs Immature Granulocytes: 0.06 10*3/uL (ref 0.00–0.07)
Basophils Absolute: 0 10*3/uL (ref 0.0–0.1)
Basophils Relative: 0 %
Eosinophils Absolute: 0.1 10*3/uL (ref 0.0–0.5)
Eosinophils Relative: 1 %
HCT: 35.9 % — ABNORMAL LOW (ref 36.0–46.0)
Hemoglobin: 12.1 g/dL (ref 12.0–15.0)
Immature Granulocytes: 1 %
Lymphocytes Relative: 9 %
Lymphs Abs: 0.7 10*3/uL (ref 0.7–4.0)
MCH: 34.9 pg — ABNORMAL HIGH (ref 26.0–34.0)
MCHC: 33.7 g/dL (ref 30.0–36.0)
MCV: 103.5 fL — ABNORMAL HIGH (ref 80.0–100.0)
Monocytes Absolute: 0.8 10*3/uL (ref 0.1–1.0)
Monocytes Relative: 10 %
Neutro Abs: 6.1 10*3/uL (ref 1.7–7.7)
Neutrophils Relative %: 79 %
Platelet Count: 75 10*3/uL — ABNORMAL LOW (ref 150–400)
RBC: 3.47 MIL/uL — ABNORMAL LOW (ref 3.87–5.11)
RDW: 14 % (ref 11.5–15.5)
WBC Count: 7.7 10*3/uL (ref 4.0–10.5)
nRBC: 0 % (ref 0.0–0.2)

## 2020-04-21 LAB — CMP (CANCER CENTER ONLY)
ALT: 20 U/L (ref 0–44)
AST: 20 U/L (ref 15–41)
Albumin: 3.4 g/dL — ABNORMAL LOW (ref 3.5–5.0)
Alkaline Phosphatase: 49 U/L (ref 38–126)
Anion gap: 10 (ref 5–15)
BUN: 21 mg/dL (ref 8–23)
CO2: 23 mmol/L (ref 22–32)
Calcium: 8.9 mg/dL (ref 8.9–10.3)
Chloride: 109 mmol/L (ref 98–111)
Creatinine: 0.84 mg/dL (ref 0.44–1.00)
GFR, Estimated: >60 mL/min (ref >60)
Glucose, Bld: 105 mg/dL — ABNORMAL HIGH (ref 70–99)
Potassium: 4.2 mmol/L (ref 3.5–5.1)
Sodium: 142 mmol/L (ref 135–145)
Total Bilirubin: 0.8 mg/dL (ref 0.3–1.2)
Total Protein: 6.6 g/dL (ref 6.5–8.1)

## 2020-04-21 MED ORDER — SODIUM CHLORIDE 0.9% FLUSH
10.0000 mL | Freq: Once | INTRAVENOUS | Status: AC
  Administered 2020-04-21: 10 mL
  Filled 2020-04-21: qty 10

## 2020-04-21 MED ORDER — SODIUM CHLORIDE 0.9 % IV SOLN
1400.0000 mg/m2 | INTRAVENOUS | Status: DC
  Administered 2020-04-21: 2850 mg via INTRAVENOUS
  Filled 2020-04-21: qty 57

## 2020-04-21 NOTE — Patient Instructions (Signed)
[facility] Discharge Instructions for Patients Receiving Chemotherapy  Today you received the following chemotherapy agents: fluorouracil.  To help prevent nausea and vomiting after your treatment, we encourage you to take your nausea medication as directed.  If you develop nausea and vomiting that is not controlled by your nausea medication, call the clinic.   BELOW ARE SYMPTOMS THAT SHOULD BE REPORTED IMMEDIATELY:  *FEVER GREATER THAN 100.5 F  *CHILLS WITH OR WITHOUT FEVER  NAUSEA AND VOMITING THAT IS NOT CONTROLLED WITH YOUR NAUSEA MEDICATION  *UNUSUAL SHORTNESS OF BREATH  *UNUSUAL BRUISING OR BLEEDING  TENDERNESS IN MOUTH AND THROAT WITH OR WITHOUT PRESENCE OF ULCERS  *URINARY PROBLEMS  *BOWEL PROBLEMS  UNUSUAL RASH Items with * indicate a potential emergency and should be followed up as soon as possible.  Feel free to call the clinic should you have any questions or concerns. The clinic phone number is [phone].  Please show the [facility] at check-in to the Emergency Department and triage nurse.

## 2020-04-21 NOTE — Telephone Encounter (Signed)
Scheduled appt per 3/23 los - pt to get an updated schedule next visit.

## 2020-04-21 NOTE — Progress Notes (Signed)
[place] OFFICE PROGRESS NOTE   Diagnosis: Rectal cancer  INTERVAL HISTORY:   Ms. Neubecker returns as scheduled.  She completed another cycle of 5-fluorouracil 04/06/2020.  She denies nausea/vomiting.  No mouth sores.  No diarrhea.  Cough and dyspnea continue to be improved.  Current prednisone dose is 10 mg daily.  Neuropathy symptoms are stable.  Objective:  Vital signs in last 24 hours:  Blood pressure (!) 145/88, pulse 94, temperature 98.1 F (36.7 C), temperature source Tympanic, resp. rate 17, height '5\' 3"'  (1.6 m), weight 209 lb 12.8 oz (95.2 kg), SpO2 96 %.    HEENT: No thrush or ulcers. Resp: Lungs clear bilaterally. Cardio: Regular rate and rhythm. GI: Abdomen soft and nontender.  No hepatomegaly. Vascular: No leg edema. Skin: Palms without erythema. Port-A-Cath without erythema.   Lab Results:  Lab Results  Component Value Date   WBC 7.7 04/21/2020   HGB 12.1 04/21/2020   HCT 35.9 (L) 04/21/2020   MCV 103.5 (H) 04/21/2020   PLT 75 (L) 04/21/2020   NEUTROABS 6.1 04/21/2020    Imaging:  No results found.  Medications: I have reviewed the patient's current medications.  Assessment/Plan: 1. Rectal cancer ? Mass at 7 cm from the anal verge on colonoscopy 08/09/2017, biopsy revealed invasive adenocarcinoma ? Staging CTs 08/17/2017-no evidence of metastatic disease, asymmetric thickening in the mid rectum ? MR pelvis 09/01/2017, T3N0 lesion beginning at 6.3 cm from the anal sphincter ? Radiation/Xeloda initiated 09/17/2017, completed 10/25/2017 ? Low anterior resection/diverting ileostomy 12/21/2017,ypT3,ypN1atumor. Lymphovascular invasion present, intact mismatch repair protein expression, treatment effect present (TRS 1). Mismatch repair protein IHC normal;Foundation 1-KRAS/NRAS wild-type, microsatellite status and tumor mutational burden could not be determined. ? Cycle 1 adjuvant Xeloda beginning 01/21/2018 ? Cycle 2 adjuvant Xeloda  beginning 02/11/2018 ? Xeloda discontinued after cycle 2 secondary to patient preference ? Ileostomy takedown 07/10/2018 ? CTs 09/07/2018-multiple live small pulmonary nodules concerning for metastatic disease ? CT chest 12/10/2018-multiple bilateral lung nodules, some have increased in size ? CT chest 04/08/2019-mild enlargement of bilateral lung nodules ? Status post SBRT multiple lung nodules 05/06/2019, 05/08/2019, 05/13/2019 ? CT chest 08/28/2019-improvement and resolution in majority of right-sided pulmonary nodules, a superior segment right lower lobe nodule has increased, no new right-sided nodules. Progressive enlargement of left-sided pulmonary nodules ? PET scan 09/09/2019-hypermetabolic right lower lobe nodule, 2 hypermetabolic left upper lobe nodules with an additional 0.7 cm left upper lobe nodule below PET resolution, groundglass opacity in the mid to right lower lobe with associated hypermetabolism consistent with postradiation change, hypermetabolic central [id number] lesion ? Cycle 1 FOLFOX 10/14/2019 ? Cycle 2FOLFOX 11/11/2019, 5-FU and oxaliplatin dose reduced secondary to neutropenia and thrombocytopenia following cycle one, G-CSF declined ? Cycle 3 FOLFOX 11/26/2019 ? Cycle 4 FOLFOX 12/11/2019, oxaliplatin held due to neutropenia and thrombocytopenia ? Cycle 5 FOLFOX 12/31/2019 ? Cycle 6 FOLFOX 01/14/2020 (oxaliplatin held, 5-fluorouracil dose reduced due to mucositis) ? CT chest 01/27/2020-improvement in left lung nodules, progressive airspace disease with traction bronchiectasis throughout the right lung, previously noted right lung nodules are obscured, mildly enlarged right paratracheal lymph nodes-not hypermetabolic on prior PET, likely reactive ? Cycle 7 FOLFOX 01/28/2020 ? Cycle 8 FOLFOX 02/11/2020 (oxaliplatin held due to neutropenia and thrombocytopenia) ? Cycle 9 FOLFOX 02/25/2020 (oxaliplatin held due to thrombocytopenia) ? Cycle 10 FOLFOX 03/11/2020 (oxaliplatin held  secondary to neuropathy) ? Cycle 11 FOLFOX 03/25/2020 (oxaliplatin held due to neuropathy) ? CT chest 04/05/2020-no new or progressive findings.  Interval evolution of presumed postradiation scarring in  the right perihilar lung with decrease in the more diffuse groundglass opacity seen previously.  No substantial change in left lung nodules. ? Cycle 12 5-fluorouracil 04/06/2020 ? Cycle 13 5-fluorouracil 04/21/2020  2.Hypothyroid  3. 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 Neutropenia and thrombocytopenia following cycle 1 FOLFOX-plan chemotherapy dose reductions, she declined G-CSF  6. Mucositis following cycle 5 FOLFOX, 5-fluorouracil dose reduced with cycle 6  7.Right lung airspace disease/volume loss-likely toxicity from chest radiation, trial of prednisone 01/29/2020;dyspnea and cough improved 02/11/2020  Progressive cough 03/11/2020-prednisone resumed at a dose of 20 mg daily  Cough and dyspnea improved 03/25/2020-prednisone taper to 15 mg daily  Improved 04/06/2020-prednisone taper to 10 mg daily  Stable 04/21/2020-prednisone taper to 5 mg daily   Disposition: Ms. Grilliot appears stable.  There is no clinical evidence of disease progression.  She will continue 5-fluorouracil on a 2-week schedule.  We reviewed the CBC from today.  Counts adequate to proceed with treatment.  She has stable moderate thrombocytopenia.  She will contact the office with bleeding.  She will taper prednisone to 5 mg daily.  She will return for lab, follow-up, 5-fluorouracil in 2 weeks.  Plan reviewed with Dr. Benay Spice.    Ned Card ANP/GNP-BC   04/21/2020  12:16 PM

## 2020-04-23 ENCOUNTER — Inpatient Hospital Stay: Payer: Medicare Other

## 2020-04-23 ENCOUNTER — Other Ambulatory Visit: Payer: Self-pay

## 2020-04-23 VITALS — BP 147/69 | HR 90

## 2020-04-23 DIAGNOSIS — C20 Malignant neoplasm of rectum: Secondary | ICD-10-CM

## 2020-04-23 MED ORDER — HEPARIN SOD (PORK) LOCK FLUSH 100 UNIT/ML IV SOLN
500.0000 [IU] | Freq: Once | INTRAVENOUS | Status: DC | PRN
  Filled 2020-04-23: qty 5

## 2020-04-23 MED ORDER — SODIUM CHLORIDE 0.9% FLUSH
10.0000 mL | INTRAVENOUS | Status: DC | PRN
Start: 2020-04-23 — End: 2020-04-23
  Filled 2020-04-23: qty 10

## 2020-05-01 ENCOUNTER — Other Ambulatory Visit: Payer: Self-pay | Admitting: Oncology

## 2020-05-04 ENCOUNTER — Telehealth: Payer: Self-pay | Admitting: Oncology

## 2020-05-04 ENCOUNTER — Other Ambulatory Visit: Payer: Medicare Other

## 2020-05-04 ENCOUNTER — Ambulatory Visit: Payer: Medicare Other | Admitting: Oncology

## 2020-05-04 ENCOUNTER — Other Ambulatory Visit: Payer: Self-pay

## 2020-05-04 ENCOUNTER — Inpatient Hospital Stay: Payer: Medicare Other

## 2020-05-04 ENCOUNTER — Inpatient Hospital Stay (HOSPITAL_BASED_OUTPATIENT_CLINIC_OR_DEPARTMENT_OTHER): Payer: Medicare Other | Admitting: Oncology

## 2020-05-04 ENCOUNTER — Inpatient Hospital Stay: Payer: Medicare Other | Attending: Oncology

## 2020-05-04 ENCOUNTER — Ambulatory Visit: Payer: Medicare Other

## 2020-05-04 VITALS — BP 159/73 | HR 93 | Temp 97.8°F | Resp 18 | Ht 63.0 in | Wt 211.2 lb

## 2020-05-04 DIAGNOSIS — C20 Malignant neoplasm of rectum: Secondary | ICD-10-CM

## 2020-05-04 DIAGNOSIS — Z5111 Encounter for antineoplastic chemotherapy: Secondary | ICD-10-CM | POA: Diagnosis not present

## 2020-05-04 DIAGNOSIS — Z923 Personal history of irradiation: Secondary | ICD-10-CM | POA: Diagnosis not present

## 2020-05-04 LAB — CBC WITH DIFFERENTIAL (CANCER CENTER ONLY)
Abs Immature Granulocytes: 0.06 10*3/uL (ref 0.00–0.07)
Basophils Absolute: 0 10*3/uL (ref 0.0–0.1)
Basophils Relative: 0 %
Eosinophils Absolute: 0.1 10*3/uL (ref 0.0–0.5)
Eosinophils Relative: 2 %
HCT: 35.9 % — ABNORMAL LOW (ref 36.0–46.0)
Hemoglobin: 12 g/dL (ref 12.0–15.0)
Immature Granulocytes: 1 %
Lymphocytes Relative: 14 %
Lymphs Abs: 1 10*3/uL (ref 0.7–4.0)
MCH: 34.3 pg — ABNORMAL HIGH (ref 26.0–34.0)
MCHC: 33.4 g/dL (ref 30.0–36.0)
MCV: 102.6 fL — ABNORMAL HIGH (ref 80.0–100.0)
Monocytes Absolute: 0.7 10*3/uL (ref 0.1–1.0)
Monocytes Relative: 10 %
Neutro Abs: 5.1 10*3/uL (ref 1.7–7.7)
Neutrophils Relative %: 73 %
Platelet Count: 119 10*3/uL — ABNORMAL LOW (ref 150–400)
RBC: 3.5 MIL/uL — ABNORMAL LOW (ref 3.87–5.11)
RDW: 13.4 % (ref 11.5–15.5)
WBC Count: 7.1 10*3/uL (ref 4.0–10.5)
nRBC: 0 % (ref 0.0–0.2)

## 2020-05-04 LAB — CMP (CANCER CENTER ONLY)
ALT: 14 U/L (ref 0–44)
AST: 16 U/L (ref 15–41)
Albumin: 3.6 g/dL (ref 3.5–5.0)
Alkaline Phosphatase: 43 U/L (ref 38–126)
Anion gap: 10 (ref 5–15)
BUN: 16 mg/dL (ref 8–23)
CO2: 24 mmol/L (ref 22–32)
Calcium: 9 mg/dL (ref 8.9–10.3)
Chloride: 108 mmol/L (ref 98–111)
Creatinine: 0.84 mg/dL (ref 0.44–1.00)
GFR, Estimated: >60 mL/min (ref >60)
Glucose, Bld: 97 mg/dL (ref 70–99)
Potassium: 3.5 mmol/L (ref 3.5–5.1)
Sodium: 142 mmol/L (ref 135–145)
Total Bilirubin: 0.7 mg/dL (ref 0.3–1.2)
Total Protein: 6.5 g/dL (ref 6.5–8.1)

## 2020-05-04 MED ORDER — HEPARIN SOD (PORK) LOCK FLUSH 100 UNIT/ML IV SOLN
500.0000 [IU] | Freq: Once | INTRAVENOUS | Status: DC | PRN
  Filled 2020-05-04: qty 5

## 2020-05-04 MED ORDER — SODIUM CHLORIDE 0.9% FLUSH
10.0000 mL | INTRAVENOUS | Status: DC | PRN
  Filled 2020-05-04: qty 10

## 2020-05-04 MED ORDER — SODIUM CHLORIDE 0.9 % IV SOLN
1400.0000 mg/m2 | INTRAVENOUS | Status: DC
  Administered 2020-05-04: 2850 mg via INTRAVENOUS
  Filled 2020-05-04: qty 57

## 2020-05-04 NOTE — Patient Instructions (Signed)
[place] Discharge Instructions for Patients receiving Home Portable Chemo Pump    **The bag should finish at 46 hours (Thursday @ 10am)   ** if the display on your pump reads "Low Volume" and it is beeping, take the batteries out of the pump and come to the cancer center for it to be taken off.   **If the pump alarms go off prior to the pump reading "Low Volume" then call the [phone] and someone can assist you.  **If the plunger comes out and the bag fluid is running out, please use your chemo spill kit to clean up the spill. Do not use paper towels or other house hold products.  ** If you have problems or questions regarding your pump, please call either the [phone] or the cancer center Monday-Friday 8:00am-4:30pm at [phone] and we will assist you.  If you are unable to get assistance then go to [facility] Emergency Room, ask the staff to contact the IV team for assistance.

## 2020-05-04 NOTE — Progress Notes (Signed)
[place] OFFICE PROGRESS NOTE   Diagnosis: Rectal cancer  INTERVAL HISTORY:   Samantha Lutz completed another cycle of 5-fluorouracil on 04/21/2020.  No mouth sores, diarrhea, or hand/foot pain.  She continues to have peripheral numbness.  Dyspnea remains improved.  She continues prednisone at a dose of 5 mg daily.  Objective:  Vital signs in last 24 hours:  Blood pressure (!) 159/73, pulse 93, temperature 97.8 F (36.6 C), temperature source Tympanic, resp. rate 18, height 5' 3" (1.6 m), weight 211 lb 3.2 oz (95.8 kg), SpO2 96 %.    HEENT: No thrush or ulcers Resp: Decreased breath sounds with inspiratory rhonchi at the right lower posterior chest, no respiratory distress Cardio: Regular rate and rhythm GI: Nontender, no hepatosplenomegaly Vascular: No leg edema  Skin: Palms without erythema  Portacath/PICC-without erythema  Lab Results:  Lab Results  Component Value Date   WBC 7.1 05/04/2020   HGB 12.0 05/04/2020   HCT 35.9 (L) 05/04/2020   MCV 102.6 (H) 05/04/2020   PLT 119 (L) 05/04/2020   NEUTROABS 5.1 05/04/2020    CMP  Lab Results  Component Value Date   NA 142 05/04/2020   K 3.5 05/04/2020   CL 108 05/04/2020   CO2 24 05/04/2020   GLUCOSE 97 05/04/2020   BUN 16 05/04/2020   CREATININE 0.84 05/04/2020   CALCIUM 9.0 05/04/2020   PROT 6.5 05/04/2020   ALBUMIN 3.6 05/04/2020   AST 16 05/04/2020   ALT 14 05/04/2020   ALKPHOS 43 05/04/2020   BILITOT 0.7 05/04/2020   GFRNONAA >60 05/04/2020   GFRAA >60 10/28/2019    Lab Results  Component Value Date   CEA1 1.17 10/13/2019    Medications: I have reviewed the patient's current medications.   Assessment/Plan: 1. Rectal cancer ? Mass at 7 cm from the anal verge on colonoscopy 08/09/2017, biopsy revealed invasive adenocarcinoma ? Staging CTs 08/17/2017-no evidence of metastatic disease, asymmetric thickening in the mid rectum ? MR pelvis 09/01/2017, T3N0 lesion beginning at 6.3 cm from  the anal sphincter ? Radiation/Xeloda initiated 09/17/2017, completed 10/25/2017 ? Low anterior resection/diverting ileostomy 12/21/2017,ypT3,ypN1atumor. Lymphovascular invasion present, intact mismatch repair protein expression, treatment effect present (TRS 1). Mismatch repair protein IHC normal;Foundation 1-KRAS/NRAS wild-type, microsatellite status and tumor mutational burden could not be determined. ? Cycle 1 adjuvant Xeloda beginning 01/21/2018 ? Cycle 2 adjuvant Xeloda beginning 02/11/2018 ? Xeloda discontinued after cycle 2 secondary to patient preference ? Ileostomy takedown 07/10/2018 ? CTs 09/07/2018-multiple live small pulmonary nodules concerning for metastatic disease ? CT chest 12/10/2018-multiple bilateral lung nodules, some have increased in size ? CT chest 04/08/2019-mild enlargement of bilateral lung nodules ? Status post SBRT multiple lung nodules 05/06/2019, 05/08/2019, 05/13/2019 ? CT chest 08/28/2019-improvement and resolution in majority of right-sided pulmonary nodules, a superior segment right lower lobe nodule has increased, no new right-sided nodules. Progressive enlargement of left-sided pulmonary nodules ? PET scan 09/09/2019-hypermetabolic right lower lobe nodule, 2 hypermetabolic left upper lobe nodules with an additional 0.7 cm left upper lobe nodule below PET resolution, groundglass opacity in the mid to right lower lobe with associated hypermetabolism consistent with postradiation change, hypermetabolic central [id number] lesion ? Cycle 1 FOLFOX 10/14/2019 ? Cycle 2FOLFOX 11/11/2019, 5-FU and oxaliplatin dose reduced secondary to neutropenia and thrombocytopenia following cycle one, G-CSF declined ? Cycle 3 FOLFOX 11/26/2019 ? Cycle 4 FOLFOX 12/11/2019, oxaliplatin held due to neutropenia and thrombocytopenia ? Cycle 5 FOLFOX 12/31/2019 ? Cycle 6 FOLFOX 01/14/2020 (oxaliplatin held, 5-fluorouracil dose reduced due to  mucositis) ? CT chest 01/27/2020-improvement in  left lung nodules, progressive airspace disease with traction bronchiectasis throughout the right lung, previously noted right lung nodules are obscured, mildly enlarged right paratracheal lymph nodes-not hypermetabolic on prior PET, likely reactive ? Cycle 7 FOLFOX 01/28/2020 ? Cycle 8 FOLFOX 02/11/2020 (oxaliplatin held due to neutropenia and thrombocytopenia) ? Cycle 9 FOLFOX 02/25/2020 (oxaliplatin held due to thrombocytopenia) ? Cycle 10 FOLFOX 03/11/2020 (oxaliplatin held secondary to neuropathy) ? Cycle 11 FOLFOX 03/25/2020 (oxaliplatin held due to neuropathy) ? CT chest 04/05/2020-no new or progressive findings.  Interval evolution of presumed postradiation scarring in the right perihilar lung with decrease in the more diffuse groundglass opacity seen previously.  No substantial change in left lung nodules. ? Cycle 12 5-fluorouracil 04/06/2020 ? Cycle 13 5-fluorouracil 04/21/2020 ? Cycle 14 5-fluorouracil 05/04/2020  2.Hypothyroid  3. 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 Neutropenia and thrombocytopenia following cycle 1 FOLFOX-plan chemotherapy dose reductions, she declined G-CSF  6. Mucositis following cycle 5 FOLFOX, 5-fluorouracil dose reduced with cycle 6  7.Right lung airspace disease/volume loss-likely toxicity from chest radiation, trial of prednisone 01/29/2020;dyspnea and cough improved 02/11/2020  Progressive cough 03/11/2020-prednisone resumed at a dose of 20 mg daily  Cough and dyspnea improved 03/25/2020-prednisone taper to 15 mg daily  Improved 04/06/2020-prednisone taper to 10 mg daily  Stable 04/21/2020-prednisone taper to 5 mg daily     Disposition: Samantha Lutz appears unchanged.  The plan is to continue every 2-week infusional 5-FU.  She will be scheduled for restaging CT in 1-2 months.  She will complete another cycle of chemotherapy today.  Ms.  Lutz will return for an office visit in 2 weeks.  Her respiratory status remains improved while on prednisone.  The plan is to continue a slow prednisone taper.  She will continue prednisone at a dose of 5 mg daily.    Betsy Coder, MD  05/04/2020  9:59 AM

## 2020-05-04 NOTE — Telephone Encounter (Signed)
Scheduled appt per 4/5 los - pt is aware of next set of appts. Patient is my chart active.

## 2020-05-06 ENCOUNTER — Other Ambulatory Visit: Payer: Medicare Other

## 2020-05-06 ENCOUNTER — Other Ambulatory Visit: Payer: Self-pay

## 2020-05-06 ENCOUNTER — Ambulatory Visit: Payer: Medicare Other | Admitting: Oncology

## 2020-05-06 ENCOUNTER — Ambulatory Visit: Payer: Medicare Other

## 2020-05-06 ENCOUNTER — Inpatient Hospital Stay: Payer: Medicare Other

## 2020-05-06 VITALS — BP 145/80 | HR 90 | Temp 98.7°F | Resp 20

## 2020-05-06 DIAGNOSIS — C20 Malignant neoplasm of rectum: Secondary | ICD-10-CM | POA: Diagnosis not present

## 2020-05-06 MED ORDER — SODIUM CHLORIDE 0.9% FLUSH
10.0000 mL | INTRAVENOUS | Status: DC | PRN
  Administered 2020-05-06: 10 mL
  Filled 2020-05-06: qty 10

## 2020-05-06 MED ORDER — HEPARIN SOD (PORK) LOCK FLUSH 100 UNIT/ML IV SOLN
500.0000 [IU] | Freq: Once | INTRAVENOUS | Status: AC | PRN
  Administered 2020-05-06: 500 [IU]
  Filled 2020-05-06: qty 5

## 2020-05-13 ENCOUNTER — Other Ambulatory Visit: Payer: Self-pay | Admitting: Oncology

## 2020-05-18 ENCOUNTER — Inpatient Hospital Stay (HOSPITAL_BASED_OUTPATIENT_CLINIC_OR_DEPARTMENT_OTHER): Payer: Medicare Other | Admitting: Oncology

## 2020-05-18 ENCOUNTER — Inpatient Hospital Stay (HOSPITAL_BASED_OUTPATIENT_CLINIC_OR_DEPARTMENT_OTHER): Payer: Medicare Other | Admitting: Nurse Practitioner

## 2020-05-18 ENCOUNTER — Inpatient Hospital Stay: Payer: Medicare Other

## 2020-05-18 ENCOUNTER — Other Ambulatory Visit: Payer: Self-pay

## 2020-05-18 ENCOUNTER — Telehealth: Payer: Self-pay | Admitting: Oncology

## 2020-05-18 VITALS — BP 159/85 | HR 86 | Temp 97.9°F | Resp 20 | Ht 63.0 in | Wt 210.6 lb

## 2020-05-18 DIAGNOSIS — E039 Hypothyroidism, unspecified: Secondary | ICD-10-CM | POA: Diagnosis not present

## 2020-05-18 DIAGNOSIS — C20 Malignant neoplasm of rectum: Secondary | ICD-10-CM

## 2020-05-18 DIAGNOSIS — Z95828 Presence of other vascular implants and grafts: Secondary | ICD-10-CM

## 2020-05-18 LAB — CBC WITH DIFFERENTIAL (CANCER CENTER ONLY)
Abs Immature Granulocytes: 0.02 10*3/uL (ref 0.00–0.07)
Basophils Absolute: 0 10*3/uL (ref 0.0–0.1)
Basophils Relative: 0 %
Eosinophils Absolute: 0.1 10*3/uL (ref 0.0–0.5)
Eosinophils Relative: 2 %
HCT: 34.6 % — ABNORMAL LOW (ref 36.0–46.0)
Hemoglobin: 11.6 g/dL — ABNORMAL LOW (ref 12.0–15.0)
Immature Granulocytes: 0 %
Lymphocytes Relative: 15 %
Lymphs Abs: 0.9 10*3/uL (ref 0.7–4.0)
MCH: 34.7 pg — ABNORMAL HIGH (ref 26.0–34.0)
MCHC: 33.5 g/dL (ref 30.0–36.0)
MCV: 103.6 fL — ABNORMAL HIGH (ref 80.0–100.0)
Monocytes Absolute: 0.8 10*3/uL (ref 0.1–1.0)
Monocytes Relative: 13 %
Neutro Abs: 3.8 10*3/uL (ref 1.7–7.7)
Neutrophils Relative %: 70 %
Platelet Count: 92 10*3/uL — ABNORMAL LOW (ref 150–400)
RBC: 3.34 MIL/uL — ABNORMAL LOW (ref 3.87–5.11)
RDW: 13.7 % (ref 11.5–15.5)
WBC Count: 5.6 10*3/uL (ref 4.0–10.5)
nRBC: 0 % (ref 0.0–0.2)

## 2020-05-18 LAB — CMP (CANCER CENTER ONLY)
ALT: 20 U/L (ref 0–44)
AST: 24 U/L (ref 15–41)
Albumin: 3.5 g/dL (ref 3.5–5.0)
Alkaline Phosphatase: 44 U/L (ref 38–126)
Anion gap: 9 (ref 5–15)
BUN: 14 mg/dL (ref 8–23)
CO2: 24 mmol/L (ref 22–32)
Calcium: 8.8 mg/dL — ABNORMAL LOW (ref 8.9–10.3)
Chloride: 109 mmol/L (ref 98–111)
Creatinine: 0.9 mg/dL (ref 0.44–1.00)
GFR, Estimated: >60 mL/min (ref >60)
Glucose, Bld: 95 mg/dL (ref 70–99)
Potassium: 4 mmol/L (ref 3.5–5.1)
Sodium: 142 mmol/L (ref 135–145)
Total Bilirubin: 0.6 mg/dL (ref 0.3–1.2)
Total Protein: 6.2 g/dL — ABNORMAL LOW (ref 6.5–8.1)

## 2020-05-18 MED ORDER — SODIUM CHLORIDE 0.9 % IV SOLN
1400.0000 mg/m2 | INTRAVENOUS | Status: DC
  Administered 2020-05-18: 2850 mg via INTRAVENOUS
  Filled 2020-05-18: qty 57

## 2020-05-18 MED ORDER — LEVOTHYROXINE SODIUM 50 MCG PO TABS: 50.0000 ug | ORAL_TABLET | Freq: Every day | ORAL | 1 refills | Status: DC

## 2020-05-18 MED ORDER — HEPARIN SOD (PORK) LOCK FLUSH 100 UNIT/ML IV SOLN
500.0000 [IU] | Freq: Once | INTRAVENOUS | Status: DC
  Filled 2020-05-18: qty 5

## 2020-05-18 MED ORDER — SODIUM CHLORIDE 0.9% FLUSH
10.0000 mL | Freq: Once | INTRAVENOUS | Status: DC
  Filled 2020-05-18: qty 10

## 2020-05-18 NOTE — Progress Notes (Signed)
[facility] OFFICE PROGRESS NOTE   Diagnosis: Rectal cancer  INTERVAL HISTORY:   [name] completed another cycle of 5-fluorouracil on 05/04/2020.  No mouth sores or diarrhea.  She continues to have numbness in the fingers and toes.  The fingers have improved.  She has dyspnea and a cough with exertion, but this remains improved.  She continues prednisone.  Objective:  Vital signs in last 24 hours:  Blood pressure (!) 159/85, pulse 86, temperature 97.9 F (36.6 C), temperature source Tympanic, resp. rate 20, height '5\' 3"'  (1.6 m), weight 210 lb 9.6 oz (95.5 kg), SpO2 95 %.    HEENT: No thrush or ulcers Resp: Decreased breath sounds throughout the right compared to the left chest with inspiratory rhonchi at the right lower posterior chest, no respiratory distress Cardio: Regular rate and rhythm GI: Nontender, no hepatosplenomegaly Vascular: No leg edema  Skin: Palms without erythema  Portacath/PICC-without erythema  Lab Results:  Lab Results  Component Value Date   WBC 5.6 05/18/2020   HGB 11.6 (L) 05/18/2020   HCT 34.6 (L) 05/18/2020   MCV 103.6 (H) 05/18/2020   PLT 92 (L) 05/18/2020   NEUTROABS 3.8 05/18/2020    CMP  Lab Results  Component Value Date   NA 142 05/18/2020   K 4.0 05/18/2020   CL 109 05/18/2020   CO2 24 05/18/2020   GLUCOSE 95 05/18/2020   BUN 14 05/18/2020   CREATININE 0.90 05/18/2020   CALCIUM 8.8 (L) 05/18/2020   PROT 6.2 (L) 05/18/2020   ALBUMIN 3.5 05/18/2020   AST 24 05/18/2020   ALT 20 05/18/2020   ALKPHOS 44 05/18/2020   BILITOT 0.6 05/18/2020   GFRNONAA >60 05/18/2020   GFRAA >60 10/28/2019    Lab Results  Component Value Date   CEA1 1.17 10/13/2019    Medications: I have reviewed the patient's current medications.   Assessment/Plan: 1. Rectal cancer ? Mass at 7 cm from the anal verge on colonoscopy 08/09/2017, biopsy revealed invasive adenocarcinoma ? Staging CTs 08/17/2017-no evidence of metastatic  disease, asymmetric thickening in the mid rectum ? MR pelvis 09/01/2017, T3N0 lesion beginning at 6.3 cm from the anal sphincter ? Radiation/Xeloda initiated 09/17/2017, completed 10/25/2017 ? Low anterior resection/diverting ileostomy 12/21/2017,ypT3,ypN1atumor. Lymphovascular invasion present, intact mismatch repair protein expression, treatment effect present (TRS 1). Mismatch repair protein IHC normal;Foundation 1-KRAS/NRAS wild-type, microsatellite status and tumor mutational burden could not be determined. ? Cycle 1 adjuvant Xeloda beginning 01/21/2018 ? Cycle 2 adjuvant Xeloda beginning 02/11/2018 ? Xeloda discontinued after cycle 2 secondary to patient preference ? Ileostomy takedown 07/10/2018 ? CTs 09/07/2018-multiple live small pulmonary nodules concerning for metastatic disease ? CT chest 12/10/2018-multiple bilateral lung nodules, some have increased in size ? CT chest 04/08/2019-mild enlargement of bilateral lung nodules ? Status post SBRT multiple lung nodules 05/06/2019, 05/08/2019, 05/13/2019 ? CT chest 08/28/2019-improvement and resolution in majority of right-sided pulmonary nodules, a superior segment right lower lobe nodule has increased, no new right-sided nodules. Progressive enlargement of left-sided pulmonary nodules ? PET scan 09/09/2019-hypermetabolic right lower lobe nodule, 2 hypermetabolic left upper lobe nodules with an additional 0.7 cm left upper lobe nodule below PET resolution, groundglass opacity in the mid to right lower lobe with associated hypermetabolism consistent with postradiation change, hypermetabolic central [id number] lesion ? Cycle 1 FOLFOX 10/14/2019 ? Cycle 2FOLFOX 11/11/2019, 5-FU and oxaliplatin dose reduced secondary to neutropenia and thrombocytopenia following cycle one, G-CSF declined ? Cycle 3 FOLFOX 11/26/2019 ? Cycle 4 FOLFOX 12/11/2019, oxaliplatin held due to  neutropenia and thrombocytopenia ? Cycle 5 FOLFOX 12/31/2019 ? Cycle 6 FOLFOX  01/14/2020 (oxaliplatin held, 5-fluorouracil dose reduced due to mucositis) ? CT chest 01/27/2020-improvement in left lung nodules, progressive airspace disease with traction bronchiectasis throughout the right lung, previously noted right lung nodules are obscured, mildly enlarged right paratracheal lymph nodes-not hypermetabolic on prior PET, likely reactive ? Cycle 7 FOLFOX 01/28/2020 ? Cycle 8 FOLFOX 02/11/2020 (oxaliplatin held due to neutropenia and thrombocytopenia) ? Cycle 9 FOLFOX 02/25/2020 (oxaliplatin held due to thrombocytopenia) ? Cycle 10 FOLFOX 03/11/2020 (oxaliplatin held secondary to neuropathy) ? Cycle 11 FOLFOX 03/25/2020 (oxaliplatin held due to neuropathy) ? CT chest 04/05/2020-no new or progressive findings.  Interval evolution of presumed postradiation scarring in the right perihilar lung with decrease in the more diffuse groundglass opacity seen previously.  No substantial change in left lung nodules. ? Cycle 12 5-fluorouracil 04/06/2020 ? Cycle 13 5-fluorouracil 04/21/2020 ? Cycle 14 5-fluorouracil 05/04/2020 ? Cycle  15 5-fluorouracil 05/18/2020  2.Hypothyroid  3. 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 Neutropenia and thrombocytopenia following cycle 1 FOLFOX-plan chemotherapy dose reductions, she declined G-CSF  6. Mucositis following cycle 5 FOLFOX, 5-fluorouracil dose reduced with cycle 6  7.Right lung airspace disease/volume loss-likely toxicity from chest radiation, trial of prednisone 01/29/2020;dyspnea and cough improved 02/11/2020  Progressive cough 03/11/2020-prednisone resumed at a dose of 20 mg daily  Cough and dyspnea improved 03/25/2020-prednisone taper to 15 mg daily  Improved 04/06/2020-prednisone taper to 10 mg daily  Stable 04/21/2020-prednisone taper to 5 mg daily     Disposition: Samantha Lutz appears stable.  She will complete another  treatment with 5-fluorouracil today.  She will return for an office visit and chemotherapy in 2 weeks.  She asked for a refill on her Synthroid.  We will check a TSH when she returns in 2 weeks.  Samantha Lutz will undergo a restaging CT of the chest in May.  Betsy Coder, MD  05/18/2020  10:06 AM

## 2020-05-18 NOTE — Patient Instructions (Signed)
[facility] Discharge Instructions for Patients Receiving Chemotherapy  Today you received the following chemotherapy agents Flourouracil (ADRUCIL).  To help prevent nausea and vomiting after your treatment, we encourage you to take your nausea medication as prescribed.   If you develop nausea and vomiting that is not controlled by your nausea medication, call the clinic.   BELOW ARE SYMPTOMS THAT SHOULD BE REPORTED IMMEDIATELY:  *FEVER GREATER THAN 100.5 F  *CHILLS WITH OR WITHOUT FEVER  NAUSEA AND VOMITING THAT IS NOT CONTROLLED WITH YOUR NAUSEA MEDICATION  *UNUSUAL SHORTNESS OF BREATH  *UNUSUAL BRUISING OR BLEEDING  TENDERNESS IN MOUTH AND THROAT WITH OR WITHOUT PRESENCE OF ULCERS  *URINARY PROBLEMS  *BOWEL PROBLEMS  UNUSUAL RASH Items with * indicate a potential emergency and should be followed up as soon as possible.  Feel free to call the clinic should you have any questions or concerns at The clinic phone number is [phone].  Please show the CHEMO ALERT CARD at check-in to the Emergency Department and triage nurse.   

## 2020-05-18 NOTE — Telephone Encounter (Signed)
Appointments scheduled per 4/19 los patient will get updates at 4/21 visit for pump d/c

## 2020-05-20 ENCOUNTER — Other Ambulatory Visit: Payer: Self-pay

## 2020-05-20 ENCOUNTER — Inpatient Hospital Stay: Payer: Medicare Other

## 2020-05-20 VITALS — BP 128/55 | HR 83 | Temp 98.5°F | Resp 18

## 2020-05-20 DIAGNOSIS — C20 Malignant neoplasm of rectum: Secondary | ICD-10-CM | POA: Diagnosis not present

## 2020-05-20 MED ORDER — SODIUM CHLORIDE 0.9% FLUSH
10.0000 mL | INTRAVENOUS | Status: DC | PRN
  Administered 2020-05-20: 10 mL
  Filled 2020-05-20: qty 10

## 2020-05-20 MED ORDER — HEPARIN SOD (PORK) LOCK FLUSH 100 UNIT/ML IV SOLN
500.0000 [IU] | Freq: Once | INTRAVENOUS | Status: AC | PRN
  Administered 2020-05-20: 500 [IU]
  Filled 2020-05-20: qty 5

## 2020-05-30 ENCOUNTER — Other Ambulatory Visit: Payer: Self-pay | Admitting: Oncology

## 2020-05-31 NOTE — Progress Notes (Signed)
Seen by Dr Benay Spice.

## 2020-06-01 ENCOUNTER — Other Ambulatory Visit: Payer: Self-pay

## 2020-06-01 ENCOUNTER — Inpatient Hospital Stay: Payer: Medicare Other | Attending: Oncology

## 2020-06-01 ENCOUNTER — Inpatient Hospital Stay (HOSPITAL_BASED_OUTPATIENT_CLINIC_OR_DEPARTMENT_OTHER): Payer: Medicare Other | Admitting: Oncology

## 2020-06-01 ENCOUNTER — Inpatient Hospital Stay: Payer: Medicare Other

## 2020-06-01 VITALS — BP 171/74 | HR 92 | Temp 98.2°F | Resp 18 | Ht 63.0 in | Wt 210.8 lb

## 2020-06-01 DIAGNOSIS — Z5111 Encounter for antineoplastic chemotherapy: Secondary | ICD-10-CM | POA: Insufficient documentation

## 2020-06-01 DIAGNOSIS — J700 Acute pulmonary manifestations due to radiation: Secondary | ICD-10-CM | POA: Diagnosis not present

## 2020-06-01 DIAGNOSIS — C20 Malignant neoplasm of rectum: Secondary | ICD-10-CM

## 2020-06-01 DIAGNOSIS — R0609 Other forms of dyspnea: Secondary | ICD-10-CM | POA: Diagnosis not present

## 2020-06-01 DIAGNOSIS — E039 Hypothyroidism, unspecified: Secondary | ICD-10-CM | POA: Diagnosis not present

## 2020-06-01 DIAGNOSIS — Z7952 Long term (current) use of systemic steroids: Secondary | ICD-10-CM | POA: Diagnosis not present

## 2020-06-01 DIAGNOSIS — Z923 Personal history of irradiation: Secondary | ICD-10-CM | POA: Insufficient documentation

## 2020-06-01 LAB — CEA (ACCESS): CEA (CHCC): <1 ng/mL (ref 0.00–5.00)

## 2020-06-01 LAB — CMP (CANCER CENTER ONLY)
ALT: 20 U/L (ref 0–44)
AST: 29 U/L (ref 15–41)
Albumin: 3.5 g/dL (ref 3.5–5.0)
Alkaline Phosphatase: 50 U/L (ref 38–126)
Anion gap: 9 (ref 5–15)
BUN: 16 mg/dL (ref 8–23)
CO2: 25 mmol/L (ref 22–32)
Calcium: 8.9 mg/dL (ref 8.9–10.3)
Chloride: 108 mmol/L (ref 98–111)
Creatinine: 0.78 mg/dL (ref 0.44–1.00)
GFR, Estimated: >60 mL/min (ref >60)
Glucose, Bld: 91 mg/dL (ref 70–99)
Potassium: 3.8 mmol/L (ref 3.5–5.1)
Sodium: 142 mmol/L (ref 135–145)
Total Bilirubin: 0.6 mg/dL (ref 0.3–1.2)
Total Protein: 6 g/dL — ABNORMAL LOW (ref 6.5–8.1)

## 2020-06-01 LAB — CBC WITH DIFFERENTIAL (CANCER CENTER ONLY)
Abs Immature Granulocytes: 0.04 10*3/uL (ref 0.00–0.07)
Basophils Absolute: 0 10*3/uL (ref 0.0–0.1)
Basophils Relative: 0 %
Eosinophils Absolute: 0.2 10*3/uL (ref 0.0–0.5)
Eosinophils Relative: 3 %
HCT: 35 % — ABNORMAL LOW (ref 36.0–46.0)
Hemoglobin: 11.9 g/dL — ABNORMAL LOW (ref 12.0–15.0)
Immature Granulocytes: 1 %
Lymphocytes Relative: 16 %
Lymphs Abs: 0.9 10*3/uL (ref 0.7–4.0)
MCH: 34.7 pg — ABNORMAL HIGH (ref 26.0–34.0)
MCHC: 34 g/dL (ref 30.0–36.0)
MCV: 102 fL — ABNORMAL HIGH (ref 80.0–100.0)
Monocytes Absolute: 0.7 10*3/uL (ref 0.1–1.0)
Monocytes Relative: 13 %
Neutro Abs: 3.6 10*3/uL (ref 1.7–7.7)
Neutrophils Relative %: 67 %
Platelet Count: 113 10*3/uL — ABNORMAL LOW (ref 150–400)
RBC: 3.43 MIL/uL — ABNORMAL LOW (ref 3.87–5.11)
RDW: 13.7 % (ref 11.5–15.5)
WBC Count: 5.3 10*3/uL (ref 4.0–10.5)
nRBC: 0 % (ref 0.0–0.2)

## 2020-06-01 LAB — TSH: TSH: 6.294 u[IU]/mL — ABNORMAL HIGH (ref 0.350–4.500)

## 2020-06-01 MED ORDER — SODIUM CHLORIDE 0.9 % IV SOLN
1400.0000 mg/m2 | INTRAVENOUS | Status: DC
  Administered 2020-06-01: 2850 mg via INTRAVENOUS
  Filled 2020-06-01: qty 57

## 2020-06-01 NOTE — Progress Notes (Signed)
[place] OFFICE PROGRESS NOTE   Diagnosis: Rectal cancer  INTERVAL HISTORY:   Sam[facility] Lutz completed another cycle of 5-FU on 04/21/2020.  No mouth sores, nausea, or diarrhea.  She reports stable exertional dyspnea.  She had a "cold" with an increased cough last week.  This has improved.  Objective:  Vital signs in last 24 hours:  Blood pressure (!) 171/74, pulse 92, temperature 98.2 F (36.8 C), temperature source Oral, resp. rate 18, height _0  (1.6 m), weight 210 lb 12.8 oz (95.6 kg), SpO2 95 %.    HEENT: No thrush or ulcers Resp: Decreased breath sounds at the right lower posterior chest, end inspiratory wheeze bilaterally, no respiratory distress Cardio: Regular rate and rhythm GI: Nontender, no hepatosplenomegaly Vascular: No leg edema  Skin: Palms without erythema  Portacath/PICC-without erythema  Lab Results:  Lab Results  Component Value Date   WBC 5.3 06/01/2020   HGB 11.9 (L) 06/01/2020   HCT 35.0 (L) 06/01/2020   MCV 102.0 (H) 06/01/2020   PLT 113 (L) 06/01/2020   NEUTROABS 3.6 06/01/2020    CMP  Lab Results  Component Value Date   NA 142 06/01/2020   K 3.8 06/01/2020   CL 108 06/01/2020   CO2 25 06/01/2020   GLUCOSE 91 06/01/2020   BUN 16 06/01/2020   CREATININE 0.78 06/01/2020   CALCIUM 8.9 06/01/2020   PROT 6.0 (L) 06/01/2020   ALBUMIN 3.5 06/01/2020   AST 29 06/01/2020   ALT 20 06/01/2020   ALKPHOS 50 06/01/2020   BILITOT 0.6 06/01/2020   GFRNONAA >60 06/01/2020   GFRAA >60 10/28/2019    Lab Results  Component Value Date   CEA1 1.17 10/13/2019    Medications: I have reviewed the patient's current medications.   Assessment/Plan:  1. Rectal cancer ? Mass at 7 cm from the anal verge on colonoscopy 08/09/2017, biopsy revealed invasive adenocarcinoma ? Staging CTs 08/17/2017-no evidence of metastatic disease, asymmetric thickening in the mid rectum ? MR pelvis 09/01/2017, T3N0 lesion beginning at 6.3 cm from the anal  sphincter ? Radiation/Xeloda initiated 09/17/2017, completed 10/25/2017 ? Low anterior resection/diverting ileostomy 12/21/2017,ypT3,ypN1atumor. Lymphovascular invasion present, intact mismatch repair protein expression, treatment effect present (TRS 1). Mismatch repair protein IHC normal;Foundation 1-KRAS/NRAS wild-type, microsatellite status and tumor mutational burden could not be determined. ? Cycle 1 adjuvant Xeloda beginning 01/21/2018 ? Cycle 2 adjuvant Xeloda beginning 02/11/2018 ? Xeloda discontinued after cycle 2 secondary to patient preference ? Ileostomy takedown 07/10/2018 ? CTs 09/07/2018-multiple live small pulmonary nodules concerning for metastatic disease ? CT chest 12/10/2018-multiple bilateral lung nodules, some have increased in size ? CT chest 04/08/2019-mild enlargement of bilateral lung nodules ? Status post SBRT multiple lung nodules 05/06/2019, 05/08/2019, 05/13/2019 ? CT chest 08/28/2019-improvement and resolution in majority of right-sided pulmonary nodules, a superior segment right lower lobe nodule has increased, no new right-sided nodules. Progressive enlargement of left-sided pulmonary nodules ? PET scan 09/09/2019-hypermetabolic right lower lobe nodule, 2 hypermetabolic left upper lobe nodules with an additional 0.7 cm left upper lobe nodule below PET resolution, groundglass opacity in the mid to right lower lobe with associated hypermetabolism consistent with postradiation change, hypermetabolic central [id number] lesion ? Cycle 1 FOLFOX 10/14/2019 ? Cycle 2FOLFOX 11/11/2019, 5-FU and oxaliplatin dose reduced secondary to neutropenia and thrombocytopenia following cycle one, G-CSF declined ? Cycle 3 FOLFOX 11/26/2019 ? Cycle 4 FOLFOX 12/11/2019, oxaliplatin held due to neutropenia and thrombocytopenia ? Cycle 5 FOLFOX 12/31/2019 ? Cycle 6 FOLFOX 01/14/2020 (oxaliplatin held, 5-fluorouracil dose reduced  due to mucositis) ? CT chest 01/27/2020-improvement in left lung  nodules, progressive airspace disease with traction bronchiectasis throughout the right lung, previously noted right lung nodules are obscured, mildly enlarged right paratracheal lymph nodes-not hypermetabolic on prior PET, likely reactive ? Cycle 7 FOLFOX 01/28/2020 ? Cycle 8 FOLFOX 02/11/2020 (oxaliplatin held due to neutropenia and thrombocytopenia) ? Cycle 9 FOLFOX 02/25/2020 (oxaliplatin held due to thrombocytopenia) ? Cycle 10 FOLFOX 03/11/2020 (oxaliplatin held secondary to neuropathy) ? Cycle 11 FOLFOX 03/25/2020 (oxaliplatin held due to neuropathy) ? CT chest 04/05/2020-no new or progressive findings.  Interval evolution of presumed postradiation scarring in the right perihilar lung with decrease in the more diffuse groundglass opacity seen previously.  No substantial change in left lung nodules. ? Cycle 12 5-fluorouracil 04/06/2020 ? Cycle 13 5-fluorouracil 04/21/2020 ? Cycle 14 5-fluorouracil 05/04/2020 ? Cycle  15 5-fluorouracil 05/18/2020 ? Cycle  16 5-fluorouracil 06/01/2020  2.Hypothyroid  3. 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 Neutropenia and thrombocytopenia following cycle 1 FOLFOX-plan chemotherapy dose reductions, she declined G-CSF  6. Mucositis following cycle 5 FOLFOX, 5-fluorouracil dose reduced with cycle 6  7.Right lung airspace disease/volume loss-likely toxicity from chest radiation, trial of prednisone 01/29/2020;dyspnea and cough improved 02/11/2020  Progressive cough 03/11/2020-prednisone resumed at a dose of 20 mg daily  Cough and dyspnea improved 03/25/2020-prednisone taper to 15 mg daily  Improved 04/06/2020-prednisone taper to 10 mg daily  Stable 04/21/2020-prednisone taper to 5 mg daily     Disposition: [facility] Lutz appears stable.  She is tolerating the 5-fluorouracil well.  She will complete another cycle today.  She will return for an office  visit and chemotherapy in 2 weeks.  She will be referred for a restaging chest CT after the next cycle of chemotherapy.  She continues prednisone for the radiation pneumonitis.  Betsy Coder, MD  06/01/2020  10:39 AM

## 2020-06-02 ENCOUNTER — Telehealth: Payer: Self-pay | Admitting: *Deleted

## 2020-06-02 NOTE — Telephone Encounter (Signed)
Left VM for return call re: lab results.

## 2020-06-02 NOTE — Telephone Encounter (Signed)
-----   Message from Ladell Pier, MD sent at 06/01/2020  4:59 PM EDT ----- Please call patient, TSH is mildly elevated, needs to follow-up with her primary provider to adjust the thyroid hormone dose, please copy the result to her primary provider

## 2020-06-03 ENCOUNTER — Other Ambulatory Visit: Payer: Self-pay

## 2020-06-03 ENCOUNTER — Inpatient Hospital Stay: Payer: Medicare Other

## 2020-06-03 VITALS — BP 130/79 | HR 92 | Temp 98.3°F | Resp 18

## 2020-06-03 DIAGNOSIS — C20 Malignant neoplasm of rectum: Secondary | ICD-10-CM | POA: Diagnosis not present

## 2020-06-03 DIAGNOSIS — Z95828 Presence of other vascular implants and grafts: Secondary | ICD-10-CM

## 2020-06-03 MED ORDER — SODIUM CHLORIDE 0.9% FLUSH
10.0000 mL | Freq: Once | INTRAVENOUS | Status: AC
  Administered 2020-06-03: 10 mL
  Filled 2020-06-03: qty 10

## 2020-06-03 MED ORDER — HEPARIN SOD (PORK) LOCK FLUSH 100 UNIT/ML IV SOLN
500.0000 [IU] | Freq: Once | INTRAVENOUS | Status: AC
  Administered 2020-06-03: 500 [IU]
  Filled 2020-06-03: qty 5

## 2020-06-03 NOTE — Progress Notes (Signed)
Samantha Lutz returns today for port de access and flush after 46 hr continous infusion of 49fu. Tolerated infusion without problems. Portacath located right chest wall was  deaccessed and flushed with 76ml NS and 500U/4ml Heparin and needle removed intact.  Procedure without incident. Patient tolerated procedure well.

## 2020-06-03 NOTE — Telephone Encounter (Signed)
Left VM for patient that her TSH is elevated. Routed lab to her PCP and instructed patient to f/u with her PCP.

## 2020-06-03 NOTE — Patient Instructions (Signed)
Samantha Lutz  Discharge Instructions: Thank you for choosing [facility] to provide your oncology and hematology care.   If you have a lab appointment with the [facility], please go directly to the [facility] and check in at the registration area.   Wear comfortable clothing and clothing appropriate for easy access to any Portacath or PICC line.   We strive to give you quality time with your provider. You may need to reschedule your appointment if you arrive late (15 or more minutes).  Arriving late affects you and other patients whose appointments are after yours.  Also, if you miss three or more appointments without notifying the office, you may be dismissed from the clinic at the provider's discretion.      For prescription refill requests, have your pharmacy contact our office and allow 72 hours for refills to be completed.    Today you received the following chemotherapy and/or immunotherapy agents 33fu pump removal     To help prevent nausea and vomiting after your treatment, we encourage you to take your nausea medication as directed.  BELOW ARE SYMPTOMS THAT SHOULD BE REPORTED IMMEDIATELY: . *FEVER GREATER THAN 100.4 F (38 C) OR HIGHER . *CHILLS OR SWEATING . *NAUSEA AND VOMITING THAT IS NOT CONTROLLED WITH YOUR NAUSEA MEDICATION . *UNUSUAL SHORTNESS OF BREATH . *UNUSUAL BRUISING OR BLEEDING . *URINARY PROBLEMS (pain or burning when urinating, or frequent urination) . *BOWEL PROBLEMS (unusual diarrhea, constipation, pain near the anus) . TENDERNESS IN MOUTH AND THROAT WITH OR WITHOUT PRESENCE OF ULCERS (sore throat, sores in mouth, or a toothache) . UNUSUAL RASH, SWELLING OR PAIN  . UNUSUAL VAGINAL DISCHARGE OR ITCHING   Items with * indicate a potential emergency and should be followed up as soon as possible or go to the Emergency Department if any problems should occur.  Please show the CHEMOTHERAPY ALERT CARD or IMMUNOTHERAPY  ALERT CARD at check-in to the Emergency Department and triage nurse.  Should you have questions after your visit or need to cancel or reschedule your appointment, please contact [facility]  Dept: [phone]  and follow the prompts.  Office hours are 8:00 a.m. to 4:30 p.m. Monday - Friday. Please note that voicemails left after 4:00 p.m. may not be returned until the following business day.  We are closed weekends and major holidays. You have access to a nurse at all times for urgent questions. Please call the main number to the clinic Dept: [phone] and follow the prompts.   For any non-urgent questions, you may also contact your provider using MyChart. We now offer e-Visits for anyone 73 and older to request care online for non-urgent symptoms. For details visit [email or web address].   Also download the MyChart app! Go to the app store, search "MyChart", open the app, select [facility], and log in with your MyChart username and password.  Due to Covid, a mask is required upon entering the hospital/clinic. If you do not have a mask, one will be given to you upon arrival. For doctor visits, patients may have 1 support person aged 72 or older with them. For treatment visits, patients cannot have anyone with them due to current Covid guidelines and our immunocompromised population.   Fluorouracil, 5-FU injection What is this medicine? FLUOROURACIL, 5-FU (flure oh YOOR a sil) is a chemotherapy drug. It slows the growth of cancer cells. This medicine is used to treat many types of cancer like breast cancer,  colon or rectal cancer, pancreatic cancer, and stomach cancer. This medicine may be used for other purposes; ask your health care provider or pharmacist if you have questions. COMMON BRAND NAME(S): Adrucil What should I tell my health care provider before I take this medicine? They need to know if you have any of these conditions:  blood  disorders  dihydropyrimidine dehydrogenase (DPD) deficiency  infection (especially a virus infection such as chickenpox, cold sores, or herpes)  kidney disease  liver disease  malnourished, poor nutrition  recent or ongoing radiation therapy  an unusual or allergic reaction to fluorouracil, other chemotherapy, other medicines, foods, dyes, or preservatives  pregnant or trying to get pregnant  breast-feeding How should I use this medicine? This drug is given as an infusion or injection into a vein. It is administered in a hospital or clinic by a specially trained health care professional. Talk to your pediatrician regarding the use of this medicine in children. Special care may be needed. Overdosage: If you think you have taken too much of this medicine contact a poison control center or emergency room at once. NOTE: This medicine is only for you. Do not share this medicine with others. What if I miss a dose? It is important not to miss your dose. Call your doctor or health care professional if you are unable to keep an appointment. What may interact with this medicine? Do not take this medicine with any of the following medications:  live virus vaccines This medicine may also interact with the following medications:  medicines that treat or prevent blood clots like warfarin, enoxaparin, and dalteparin This list may not describe all possible interactions. Give your health care provider a list of all the medicines, herbs, non-prescription drugs, or dietary supplements you use. Also tell them if you smoke, drink alcohol, or use illegal drugs. Some items may interact with your medicine. What should I watch for while using this medicine? Visit your doctor for checks on your progress. This drug may make you feel generally unwell. This is not uncommon, as chemotherapy can affect healthy cells as well as cancer cells. Report any side effects. Continue your course of treatment even though  you feel ill unless your doctor tells you to stop. In some cases, you may be given additional medicines to help with side effects. Follow all directions for their use. Call your doctor or health care professional for advice if you get a fever, chills or sore throat, or other symptoms of a cold or flu. Do not treat yourself. This drug decreases your body's ability to fight infections. Try to avoid being around people who are sick. This medicine may increase your risk to bruise or bleed. Call your doctor or health care professional if you notice any unusual bleeding. Be careful brushing and flossing your teeth or using a toothpick because you may get an infection or bleed more easily. If you have any dental work done, tell your dentist you are receiving this medicine. Avoid taking products that contain aspirin, acetaminophen, ibuprofen, naproxen, or ketoprofen unless instructed by your doctor. These medicines may hide a fever. Do not become pregnant while taking this medicine. Women should inform their doctor if they wish to become pregnant or think they might be pregnant. There is a potential for serious side effects to an unborn child. Talk to your health care professional or pharmacist for more information. Do not breast-feed an infant while taking this medicine. Men should inform their doctor if they wish to  father a child. This medicine may lower sperm counts. Do not treat diarrhea with over the counter products. Contact your doctor if you have diarrhea that lasts more than 2 days or if it is severe and watery. This medicine can make you more sensitive to the sun. Keep out of the sun. If you cannot avoid being in the sun, wear protective clothing and use sunscreen. Do not use sun lamps or tanning beds/booths. What side effects may I notice from receiving this medicine? Side effects that you should report to your doctor or health care professional as soon as possible:  allergic reactions like skin  rash, itching or hives, swelling of the face, lips, or tongue  low blood counts - this medicine may decrease the number of white blood cells, red blood cells and platelets. You may be at increased risk for infections and bleeding.  signs of infection - fever or chills, cough, sore throat, pain or difficulty passing urine  signs of decreased platelets or bleeding - bruising, pinpoint red spots on the skin, black, tarry stools, blood in the urine  signs of decreased red blood cells - unusually weak or tired, fainting spells, lightheadedness  breathing problems  changes in vision  chest pain  mouth sores  nausea and vomiting  pain, swelling, redness at site where injected  pain, tingling, numbness in the hands or feet  redness, swelling, or sores on hands or feet  stomach pain  unusual bleeding Side effects that usually do not require medical attention (report to your doctor or health care professional if they continue or are bothersome):  changes in finger or toe nails  diarrhea  dry or itchy skin  hair loss  headache  loss of appetite  sensitivity of eyes to the light  stomach upset  unusually teary eyes This list may not describe all possible side effects. Call your doctor for medical advice about side effects. You may report side effects to FDA at 1-800-FDA-1088. Where should I keep my medicine? This drug is given in a hospital or clinic and will not be stored at home. NOTE: This sheet is a summary. It may not cover all possible information. If you have questions about this medicine, talk to your doctor, pharmacist, or health care provider.  2021 Elsevier/Gold Standard (2018-12-17 15:00:03)

## 2020-06-04 LAB — CEA (IN HOUSE-CHCC): CEA (CHCC-In House): <1 ng/mL (ref 0.00–5.00)

## 2020-06-13 ENCOUNTER — Other Ambulatory Visit: Payer: Self-pay | Admitting: Oncology

## 2020-06-15 ENCOUNTER — Inpatient Hospital Stay: Payer: Medicare Other

## 2020-06-15 ENCOUNTER — Inpatient Hospital Stay (HOSPITAL_BASED_OUTPATIENT_CLINIC_OR_DEPARTMENT_OTHER): Payer: Medicare Other | Admitting: Nurse Practitioner

## 2020-06-15 ENCOUNTER — Encounter: Payer: Self-pay | Admitting: Nurse Practitioner

## 2020-06-15 ENCOUNTER — Other Ambulatory Visit: Payer: Self-pay

## 2020-06-15 VITALS — BP 143/86 | HR 95 | Temp 97.8°F | Resp 18 | Ht 63.0 in | Wt 209.4 lb

## 2020-06-15 DIAGNOSIS — C20 Malignant neoplasm of rectum: Secondary | ICD-10-CM

## 2020-06-15 LAB — CBC WITH DIFFERENTIAL (CANCER CENTER ONLY)
Abs Immature Granulocytes: 0.02 10*3/uL (ref 0.00–0.07)
Basophils Absolute: 0 10*3/uL (ref 0.0–0.1)
Basophils Relative: 0 %
Eosinophils Absolute: 0.2 10*3/uL (ref 0.0–0.5)
Eosinophils Relative: 3 %
HCT: 36 % (ref 36.0–46.0)
Hemoglobin: 12.2 g/dL (ref 12.0–15.0)
Immature Granulocytes: 0 %
Lymphocytes Relative: 10 %
Lymphs Abs: 0.7 10*3/uL (ref 0.7–4.0)
MCH: 34.7 pg — ABNORMAL HIGH (ref 26.0–34.0)
MCHC: 33.9 g/dL (ref 30.0–36.0)
MCV: 102.3 fL — ABNORMAL HIGH (ref 80.0–100.0)
Monocytes Absolute: 0.8 10*3/uL (ref 0.1–1.0)
Monocytes Relative: 12 %
Neutro Abs: 5 10*3/uL (ref 1.7–7.7)
Neutrophils Relative %: 75 %
Platelet Count: 104 10*3/uL — ABNORMAL LOW (ref 150–400)
RBC: 3.52 MIL/uL — ABNORMAL LOW (ref 3.87–5.11)
RDW: 13.6 % (ref 11.5–15.5)
WBC Count: 6.7 10*3/uL (ref 4.0–10.5)
nRBC: 0 % (ref 0.0–0.2)

## 2020-06-15 LAB — CMP (CANCER CENTER ONLY)
ALT: 18 U/L (ref 0–44)
AST: 25 U/L (ref 15–41)
Albumin: 3.7 g/dL (ref 3.5–5.0)
Alkaline Phosphatase: 58 U/L (ref 38–126)
Anion gap: 10 (ref 5–15)
BUN: 14 mg/dL (ref 8–23)
CO2: 24 mmol/L (ref 22–32)
Calcium: 9.1 mg/dL (ref 8.9–10.3)
Chloride: 107 mmol/L (ref 98–111)
Creatinine: 0.88 mg/dL (ref 0.44–1.00)
GFR, Estimated: >60 mL/min (ref >60)
Glucose, Bld: 105 mg/dL — ABNORMAL HIGH (ref 70–99)
Potassium: 4.1 mmol/L (ref 3.5–5.1)
Sodium: 141 mmol/L (ref 135–145)
Total Bilirubin: 0.8 mg/dL (ref 0.3–1.2)
Total Protein: 6.3 g/dL — ABNORMAL LOW (ref 6.5–8.1)

## 2020-06-15 MED ORDER — SODIUM CHLORIDE 0.9 % IV SOLN
1400.0000 mg/m2 | INTRAVENOUS | Status: DC
  Administered 2020-06-15: 2850 mg via INTRAVENOUS
  Filled 2020-06-15: qty 57

## 2020-06-15 NOTE — Progress Notes (Signed)
[facility] OFFICE PROGRESS NOTE   Diagnosis: Rectal cancer  INTERVAL HISTORY:   Ms. Wieczorek returns as scheduled.  She completed another cycle of 5-fluorouracil 06/01/2020.  She denies nausea/vomiting.  No mouth sores.  No diarrhea.  No hand or foot pain or redness.  She has occasional mild dyspnea on exertion.  She continues prednisone 5 mg daily.  Objective:  Vital signs in last 24 hours:  Blood pressure (!) 143/86, pulse 95, temperature 97.8 F (36.6 C), temperature source Oral, resp. rate 18, height '5\' 3"'  (1.6 m), weight 209 lb 6.4 oz (95 kg), SpO2 95 %.    HEENT: No thrush or ulcers. Resp: Diminished breath sounds right lower posterior chest.  No respiratory distress. Cardio: Regular rate and rhythm. GI: Abdomen soft and nontender.  No hepatosplenomegaly. Vascular: No leg edema. Skin: Palms without erythema. Port-A-Cath without erythema.   Lab Results:  Lab Results  Component Value Date   WBC 6.7 06/15/2020   HGB 12.2 06/15/2020   HCT 36.0 06/15/2020   MCV 102.3 (H) 06/15/2020   PLT 104 (L) 06/15/2020   NEUTROABS 5.0 06/15/2020    Imaging:  No results found.  Medications: I have reviewed the patient's current medications.  Assessment/Plan: 1. Rectal cancer ? Mass at 7 cm from the anal verge on colonoscopy 08/09/2017, biopsy revealed invasive adenocarcinoma ? Staging CTs 08/17/2017-no evidence of metastatic disease, asymmetric thickening in the mid rectum ? MR pelvis 09/01/2017, T3N0 lesion beginning at 6.3 cm from the anal sphincter ? Radiation/Xeloda initiated 09/17/2017, completed 10/25/2017 ? Low anterior resection/diverting ileostomy 12/21/2017,ypT3,ypN1atumor. Lymphovascular invasion present, intact mismatch repair protein expression, treatment effect present (TRS 1). Mismatch repair protein IHC normal;Foundation 1-KRAS/NRAS wild-type, microsatellite status and tumor mutational burden could not be determined. ? Cycle 1 adjuvant Xeloda  beginning 01/21/2018 ? Cycle 2 adjuvant Xeloda beginning 02/11/2018 ? Xeloda discontinued after cycle 2 secondary to patient preference ? Ileostomy takedown 07/10/2018 ? CTs 09/07/2018-multiple live small pulmonary nodules concerning for metastatic disease ? CT chest 12/10/2018-multiple bilateral lung nodules, some have increased in size ? CT chest 04/08/2019-mild enlargement of bilateral lung nodules ? Status post SBRT multiple lung nodules 05/06/2019, 05/08/2019, 05/13/2019 ? CT chest 08/28/2019-improvement and resolution in majority of right-sided pulmonary nodules, a superior segment right lower lobe nodule has increased, no new right-sided nodules. Progressive enlargement of left-sided pulmonary nodules ? PET scan 09/09/2019-hypermetabolic right lower lobe nodule, 2 hypermetabolic left upper lobe nodules with an additional 0.7 cm left upper lobe nodule below PET resolution, groundglass opacity in the mid to right lower lobe with associated hypermetabolism consistent with postradiation change, hypermetabolic central [id number] lesion ? Cycle 1 FOLFOX 10/14/2019 ? Cycle 2FOLFOX 11/11/2019, 5-FU and oxaliplatin dose reduced secondary to neutropenia and thrombocytopenia following cycle one, G-CSF declined ? Cycle 3 FOLFOX 11/26/2019 ? Cycle 4 FOLFOX 12/11/2019, oxaliplatin held due to neutropenia and thrombocytopenia ? Cycle 5 FOLFOX 12/31/2019 ? Cycle 6 FOLFOX 01/14/2020 (oxaliplatin held, 5-fluorouracil dose reduced due to mucositis) ? CT chest 01/27/2020-improvement in left lung nodules, progressive airspace disease with traction bronchiectasis throughout the right lung, previously noted right lung nodules are obscured, mildly enlarged right paratracheal lymph nodes-not hypermetabolic on prior PET, likely reactive ? Cycle 7 FOLFOX 01/28/2020 ? Cycle 8 FOLFOX 02/11/2020 (oxaliplatin held due to neutropenia and thrombocytopenia) ? Cycle 9 FOLFOX 02/25/2020 (oxaliplatin held due to  thrombocytopenia) ? Cycle 10 FOLFOX 03/11/2020 (oxaliplatin held secondary to neuropathy) ? Cycle 11 FOLFOX 03/25/2020 (oxaliplatin held due to neuropathy) ? CT chest 04/05/2020-no new or progressive  findings. Interval evolution of presumed postradiation scarring in the right perihilar lung with decrease in the more diffuse groundglass opacity seen previously. No substantial change in left lung nodules. ? Cycle125-fluorouracil 04/06/2020 ? Cycle 13 5-fluorouracil 04/21/2020 ? Cycle 14 5-fluorouracil 05/04/2020 ? Cycle  15 5-fluorouracil 05/18/2020 ? Cycle  16 5-fluorouracil 06/01/2020 ? Cycle 17 5-fluorouracil 06/15/2020  2.Hypothyroid  3. 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 Neutropenia and thrombocytopenia following cycle 1 FOLFOX-plan chemotherapy dose reductions, she declined G-CSF  6. Mucositis following cycle 5 FOLFOX, 5-fluorouracil dose reduced with cycle 6  7.Right lung airspace disease/volume loss-likely toxicity from chest radiation, trial of prednisone 01/29/2020;dyspnea and cough improved 02/11/2020  Progressive cough 03/11/2020-prednisone resumed at a dose of 20 mg daily  Cough and dyspnea improved 03/25/2020-prednisone taper to 15 mg daily  Improved 04/06/2020-prednisone taper to 10 mg daily  Stable 04/21/2020-prednisone taper to 5 mg daily   Disposition: Ms. Debellis appears stable. She has completed 16 cycles of 5-fluorouracil and continues to tolerate chemotherapy well.  There is no clinical evidence of disease progression.  Plan to proceed with cycle 17 5-fluorouracil today as scheduled.  Restaging chest CT prior to next office visit.  We reviewed the CBC and chemistry panel from today.  Labs adequate to proceed with treatment.  She has persistent mild thrombocytopenia.  She understands to contact the office with bleeding.  She will return for follow-up in  2 weeks.  She will contact the office in the interim with any problems.   Ned Card ANP/GNP-BC   06/15/2020  1:54 PM

## 2020-06-15 NOTE — Patient Instructions (Signed)
[facility]  Discharge Instructions: Thank you for choosing [facility] to provide your oncology and hematology care.   If you have a lab appointment with the Cancer Center, please go directly to the Cancer Center and check in at the registration area.   Wear comfortable clothing and clothing appropriate for easy access to any Portacath or PICC line.   We strive to give you quality time with your provider. You may need to reschedule your appointment if you arrive late (15 or more minutes).  Arriving late affects you and other patients whose appointments are after yours.  Also, if you miss three or more appointments without notifying the office, you may be dismissed from the clinic at the provider's discretion.      For prescription refill requests, have your pharmacy contact our office and allow 72 hours for refills to be completed.    Today you received the following chemotherapy and/or immunotherapy agents fluorouracil    To help prevent nausea and vomiting after your treatment, we encourage you to take your nausea medication as directed.  BELOW ARE SYMPTOMS THAT SHOULD BE REPORTED IMMEDIATELY: . *FEVER GREATER THAN 100.4 F (38 C) OR HIGHER . *CHILLS OR SWEATING . *NAUSEA AND VOMITING THAT IS NOT CONTROLLED WITH YOUR NAUSEA MEDICATION . *UNUSUAL SHORTNESS OF BREATH . *UNUSUAL BRUISING OR BLEEDING . *URINARY PROBLEMS (pain or burning when urinating, or frequent urination) . *BOWEL PROBLEMS (unusual diarrhea, constipation, pain near the anus) . TENDERNESS IN MOUTH AND THROAT WITH OR WITHOUT PRESENCE OF ULCERS (sore throat, sores in mouth, or a toothache) . UNUSUAL RASH, SWELLING OR PAIN  . UNUSUAL VAGINAL DISCHARGE OR ITCHING   Items with * indicate a potential emergency and should be followed up as soon as possible or go to the Emergency Department if any problems should occur.  Please show the CHEMOTHERAPY ALERT CARD or IMMUNOTHERAPY ALERT  CARD at check-in to the Emergency Department and triage nurse.  Should you have questions after your visit or need to cancel or reschedule your appointment, please contact [facility]  Dept: [phone]  and follow the prompts.  Office hours are 8:00 a.m. to 4:30 p.m. Monday - Friday. Please note that voicemails left after 4:00 p.m. may not be returned until the following business day.  We are closed weekends and major holidays. You have access to a nurse at all times for urgent questions. Please call the main number to the clinic Dept: [phone] and follow the prompts.   For any non-urgent questions, you may also contact your provider using MyChart. We now offer e-Visits for anyone 18 and older to request care online for non-urgent symptoms. For details visit [email or web address].   Also download the MyChart app! Go to the app store, search "MyChart", open the app, select [facility], and log in with your MyChart username and password.  Due to Covid, a mask is required upon entering the hospital/clinic. If you do not have a mask, one will be given to you upon arrival. For doctor visits, patients may have 1 support person aged 18 or older with them. For treatment visits, patients cannot have anyone with them due to current Covid guidelines and our immunocompromised population.   Fluorouracil, 5-FU injection What is this medicine? FLUOROURACIL, 5-FU (flure oh YOOR a sil) is a chemotherapy drug. It slows the growth of cancer cells. This medicine is used to treat many types of cancer like breast cancer, colon or rectal   cancer, pancreatic cancer, and stomach cancer. This medicine may be used for other purposes; ask your health care provider or pharmacist if you have questions. COMMON BRAND NAME(S): Adrucil What should I tell my health care provider before I take this medicine? They need to know if you have any of these conditions:  blood disorders  dihydropyrimidine  dehydrogenase (DPD) deficiency  infection (especially a virus infection such as chickenpox, cold sores, or herpes)  kidney disease  liver disease  malnourished, poor nutrition  recent or ongoing radiation therapy  an unusual or allergic reaction to fluorouracil, other chemotherapy, other medicines, foods, dyes, or preservatives  pregnant or trying to get pregnant  breast-feeding How should I use this medicine? This drug is given as an infusion or injection into a vein. It is administered in a hospital or clinic by a specially trained health care professional. Talk to your pediatrician regarding the use of this medicine in children. Special care may be needed. Overdosage: If you think you have taken too much of this medicine contact a poison control center or emergency room at once. NOTE: This medicine is only for you. Do not share this medicine with others. What if I miss a dose? It is important not to miss your dose. Call your doctor or health care professional if you are unable to keep an appointment. What may interact with this medicine? Do not take this medicine with any of the following medications:  live virus vaccines This medicine may also interact with the following medications:  medicines that treat or prevent blood clots like warfarin, enoxaparin, and dalteparin This list may not describe all possible interactions. Give your health care provider a list of all the medicines, herbs, non-prescription drugs, or dietary supplements you use. Also tell them if you smoke, drink alcohol, or use illegal drugs. Some items may interact with your medicine. What should I watch for while using this medicine? Visit your doctor for checks on your progress. This drug may make you feel generally unwell. This is not uncommon, as chemotherapy can affect healthy cells as well as cancer cells. Report any side effects. Continue your course of treatment even though you feel ill unless your doctor  tells you to stop. In some cases, you may be given additional medicines to help with side effects. Follow all directions for their use. Call your doctor or health care professional for advice if you get a fever, chills or sore throat, or other symptoms of a cold or flu. Do not treat yourself. This drug decreases your body's ability to fight infections. Try to avoid being around people who are sick. This medicine may increase your risk to bruise or bleed. Call your doctor or health care professional if you notice any unusual bleeding. Be careful brushing and flossing your teeth or using a toothpick because you may get an infection or bleed more easily. If you have any dental work done, tell your dentist you are receiving this medicine. Avoid taking products that contain aspirin, acetaminophen, ibuprofen, naproxen, or ketoprofen unless instructed by your doctor. These medicines may hide a fever. Do not become pregnant while taking this medicine. Women should inform their doctor if they wish to become pregnant or think they might be pregnant. There is a potential for serious side effects to an unborn child. Talk to your health care professional or pharmacist for more information. Do not breast-feed an infant while taking this medicine. Men should inform their doctor if they wish to father a child.   This medicine may lower sperm counts. Do not treat diarrhea with over the counter products. Contact your doctor if you have diarrhea that lasts more than 2 days or if it is severe and watery. This medicine can make you more sensitive to the sun. Keep out of the sun. If you cannot avoid being in the sun, wear protective clothing and use sunscreen. Do not use sun lamps or tanning beds/booths. What side effects may I notice from receiving this medicine? Side effects that you should report to your doctor or health care professional as soon as possible:  allergic reactions like skin rash, itching or hives, swelling of  the face, lips, or tongue  low blood counts - this medicine may decrease the number of white blood cells, red blood cells and platelets. You may be at increased risk for infections and bleeding.  signs of infection - fever or chills, cough, sore throat, pain or difficulty passing urine  signs of decreased platelets or bleeding - bruising, pinpoint red spots on the skin, black, tarry stools, blood in the urine  signs of decreased red blood cells - unusually weak or tired, fainting spells, lightheadedness  breathing problems  changes in vision  chest pain  mouth sores  nausea and vomiting  pain, swelling, redness at site where injected  pain, tingling, numbness in the hands or feet  redness, swelling, or sores on hands or feet  stomach pain  unusual bleeding Side effects that usually do not require medical attention (report to your doctor or health care professional if they continue or are bothersome):  changes in finger or toe nails  diarrhea  dry or itchy skin  hair loss  headache  loss of appetite  sensitivity of eyes to the light  stomach upset  unusually teary eyes This list may not describe all possible side effects. Call your doctor for medical advice about side effects. You may report side effects to FDA at 1-800-FDA-1088. Where should I keep my medicine? This drug is given in a hospital or clinic and will not be stored at home. NOTE: This sheet is a summary. It may not cover all possible information. If you have questions about this medicine, talk to your doctor, pharmacist, or health care provider.  2021 Elsevier/Gold Standard (2018-12-17 15:00:03)   

## 2020-06-17 ENCOUNTER — Inpatient Hospital Stay: Payer: Medicare Other

## 2020-06-17 ENCOUNTER — Other Ambulatory Visit: Payer: Self-pay

## 2020-06-17 ENCOUNTER — Encounter: Payer: Self-pay | Admitting: Nurse Practitioner

## 2020-06-17 VITALS — BP 150/75 | HR 88 | Temp 98.2°F | Resp 18

## 2020-06-17 DIAGNOSIS — C20 Malignant neoplasm of rectum: Secondary | ICD-10-CM | POA: Diagnosis not present

## 2020-06-17 MED ORDER — HEPARIN SOD (PORK) LOCK FLUSH 100 UNIT/ML IV SOLN
500.0000 [IU] | Freq: Once | INTRAVENOUS | Status: AC | PRN
  Administered 2020-06-17: 500 [IU]
  Filled 2020-06-17: qty 5

## 2020-06-17 MED ORDER — SODIUM CHLORIDE 0.9% FLUSH
10.0000 mL | INTRAVENOUS | Status: DC | PRN
  Administered 2020-06-17: 10 mL
  Filled 2020-06-17: qty 10

## 2020-06-25 ENCOUNTER — Other Ambulatory Visit: Payer: Self-pay

## 2020-06-25 ENCOUNTER — Ambulatory Visit (HOSPITAL_BASED_OUTPATIENT_CLINIC_OR_DEPARTMENT_OTHER)
Admission: RE | Admit: 2020-06-25 | Discharge: 2020-06-25 | Disposition: A | Discharge status: Home / Self Care | Payer: Medicare Other | Source: Ambulatory Visit | Attending: Oncology | Admitting: Oncology

## 2020-06-25 DIAGNOSIS — C20 Malignant neoplasm of rectum: Secondary | ICD-10-CM | POA: Diagnosis present

## 2020-06-27 ENCOUNTER — Other Ambulatory Visit: Payer: Self-pay | Admitting: Oncology

## 2020-06-29 ENCOUNTER — Inpatient Hospital Stay: Payer: Medicare Other

## 2020-06-29 ENCOUNTER — Inpatient Hospital Stay: Payer: Medicare Other | Admitting: Nurse Practitioner

## 2020-06-30 ENCOUNTER — Encounter (HOSPITAL_BASED_OUTPATIENT_CLINIC_OR_DEPARTMENT_OTHER): Payer: Self-pay | Admitting: *Deleted

## 2020-06-30 ENCOUNTER — Emergency Department (HOSPITAL_BASED_OUTPATIENT_CLINIC_OR_DEPARTMENT_OTHER)
Admission: EM | Admit: 2020-06-30 | Discharge: 2020-06-30 | Disposition: A | Discharge status: Home / Self Care | Payer: Medicare Other | Attending: Emergency Medicine | Admitting: Emergency Medicine

## 2020-06-30 ENCOUNTER — Other Ambulatory Visit: Payer: Self-pay

## 2020-06-30 ENCOUNTER — Emergency Department (HOSPITAL_BASED_OUTPATIENT_CLINIC_OR_DEPARTMENT_OTHER): Payer: Medicare Other | Admitting: Radiology

## 2020-06-30 DIAGNOSIS — Z85048 Personal history of other malignant neoplasm of rectum, rectosigmoid junction, and anus: Secondary | ICD-10-CM | POA: Diagnosis not present

## 2020-06-30 DIAGNOSIS — Z79899 Other long term (current) drug therapy: Secondary | ICD-10-CM | POA: Insufficient documentation

## 2020-06-30 DIAGNOSIS — E039 Hypothyroidism, unspecified: Secondary | ICD-10-CM | POA: Diagnosis not present

## 2020-06-30 DIAGNOSIS — W228XXA Striking against or struck by other objects, initial encounter: Secondary | ICD-10-CM | POA: Insufficient documentation

## 2020-06-30 DIAGNOSIS — I10 Essential (primary) hypertension: Secondary | ICD-10-CM | POA: Insufficient documentation

## 2020-06-30 DIAGNOSIS — M109 Gout, unspecified: Secondary | ICD-10-CM | POA: Insufficient documentation

## 2020-06-30 DIAGNOSIS — M79674 Pain in right toe(s): Secondary | ICD-10-CM | POA: Diagnosis present

## 2020-06-30 MED ORDER — PREDNISONE 10 MG (21) PO TBPK: ORAL_TABLET | Freq: Every day | ORAL | 0 refills | Status: DC

## 2020-06-30 NOTE — ED Triage Notes (Signed)
Hit her right toe on the door way this Sunday.  Right great toe is red and swollen.

## 2020-06-30 NOTE — ED Provider Notes (Signed)
[facility] EMERGENCY DEPT Provider Note   CSN: [id number] Arrival date & time: 06/30/20  1353     History Chief Complaint  Patient presents with  . Toe Injury    Samantha Lutz is a 70 y.o. female.  70 year old female with past medical history below including rectal cancer on chemotherapy, hypothyroidism, fatty liver who presents with right toe pain.  3 days ago, patient kicked a door with her right great toe.  Initially, she did not think much of it and continued on with her activity.  The next morning it was a Kathlene Yano bit sore but was not bothering her.  Over the past day or 2, she has developed more severe pain and swelling at the base of her right great toe.  She denies any other joint swelling or pain.  No fevers or recent illness.  She denies history of gout.  The history is provided by the patient.       Past Medical History:  Diagnosis Date  . Anemia    remote hx.  . Arthritis    Bi lat knee  . Cancer ([facility]) 07/2017   Rectal Cancer   . Fatty liver    ultrasound December 2012  . Hypothyroid 01/16/2013 dx  . Irregular heartbeat    PER PATIENT REPORT; ONSET 20+YEARS AGO SAW CARDIOLOGY AT THE TIME C/O "ITS BEAT REALLY FAST FOR A MINUTE AND THEN STOPPED " DENIES ANY OTHER CARDIAC SX';  REPORTS CARDIO DID ECHO WHICH WAS NEGATIVE;  NOW COMES AND GOES ;   . Lichen sclerosus    steroids as needed  . Temporary low platelet count ([place])    SEE LAST LABS IN EPIC    Patient Active Problem List   Diagnosis Date Noted  . Port-A-Cath in place 11/25/2019  . Goals of care, counseling/discussion 10/02/2019  . Lung metastasis ([facility]) 04/15/2019  . Status post ileostomy takedown 07/10/2018 07/10/2018  . Rectal cancer [id number] (1/23 LN) s/p neoadj chemoXRT, robotic LAR resection & diverting loop ileostomy 12/21/2017 09/04/2017  . Hypothyroid 01/16/2013  . Right knee pain   . Elevated LFTs 01/03/2011  . Hypertension 12/19/2010  . Morbid obesity with body mass index of  40.0-49.9 (HCC)     Past Surgical History:  Procedure Laterality Date  . APPENDECTOMY  1967  . [facility]  . COLONOSCOPY  08/09/2017   [facility] - polyps  . ILEOSTOMY CLOSURE N/A 07/10/2018   Procedure: ILEOSTOMY LOOP TAKEDOWN WITH TAP BLOCK;  Surgeon: [name], MD;  Location: [facility];  Service: General;  Laterality: N/A;  . IR IMAGING GUIDED PORT INSERTION  09/29/2019  . OSTOMY N/A 12/21/2017   Procedure: DIVERTING LOOP OSTOMY;  Surgeon: [name], MD;  Location: [facility];  Service: General;  Laterality: N/A;  . PROCTOSCOPY N/A 12/21/2017   Procedure: RIGID PROCTOSCOPY;  Surgeon: [name], MD;  Location: [facility];  Service: General;  Laterality: N/A;  . XI ROBOTIC ASSISTED LOWER ANTERIOR RESECTION N/A 12/21/2017   Procedure: XI ROBOTIC ASSISTED LOWER ANTERIOR RESECTION;  Surgeon: [name], MD;  Location: [facility];  Service: General;  Laterality: N/A;     OB History    Gravida  2   Para  2   Term      Preterm      AB      Living        SAB      IAB      Ectopic      Multiple  Live Births              Family History  Problem Relation Age of Onset  . Diabetes Mother   . Hypertension Mother   . COPD Mother   . Glaucoma Father 56  . Stroke Maternal Grandmother 56  . Hypertension Maternal Grandmother   . Rheum arthritis Sister 71  . Hypertension Daughter   . Breast cancer Cousin   . Colon cancer Neg Hx   . Esophageal cancer Neg Hx   . Liver cancer Neg Hx   . Pancreatic cancer Neg Hx   . Stomach cancer Neg Hx   . Rectal cancer Neg Hx     Social History   Tobacco Use  . Smoking status: Never Smoker  . Smokeless tobacco: Never Used  Vaping Use  . Vaping Use: Never used  Substance Use Topics  . Alcohol use: No  . Drug use: No    Home Medications Prior to Admission medications   Medication Sig Start Date End Date Taking? Authorizing Provider  levothyroxine (SYNTHROID) 50 MCG tablet Take 1 tablet (50 mcg total) by  mouth daily. Annual appt due in Oct must see provider for future refills 05/18/20  Yes Ladell Pier, MD  lidocaine-prilocaine (EMLA) cream Apply 1 application topically as needed. Apply to port site 1-2 hours prior to use 10/02/19  Yes Owens Shark, NP  potassium chloride SA (KLOR-CON) 20 MEQ tablet Take 1 tablet (20 mEq total) by mouth daily. 10/28/19  Yes Owens Shark, NP  predniSONE (STERAPRED UNI-PAK 21 TAB) 10 MG (21) TBPK tablet Take by mouth daily. Take 6 tabs by mouth daily  for 2 days, then 5 tabs for 2 days, then 4 tabs for 2 days, then 3 tabs for 2 days, 2 tabs for 2 days, then 1 tab by mouth daily for 2 days 06/30/20  Yes Montrell Cessna, Wenda Overland, MD  albuterol (PROVENTIL HFA) 108 (90 Base) MCG/ACT inhaler Inhale 2 puffs into the lungs every 6 (six) hours as needed for wheezing or shortness of breath. 03/11/20   Ladell Pier, MD  prochlorperazine (COMPAZINE) 10 MG tablet Take 1 tablet (10 mg total) by mouth every 6 (six) hours as needed for nausea or vomiting. 10/02/19   Owens Shark, NP  Spacer/Aero-Holding Chambers (AEROCHAMBER MV) inhaler Use as instructed 12/31/19   Ladell Pier, MD    Allergies    Epinephrine and Losartan  Review of Systems   Review of Systems  Constitutional: Negative for fever.  Musculoskeletal: Positive for joint swelling.  Skin: Positive for color change. Negative for wound.  Neurological: Negative for numbness.    Physical Exam Updated Vital Signs BP (!) 172/91 (BP Location: Right Arm)   Pulse 97   Temp 98.6 F (37 C) (Oral)   Resp 16   Ht 5\' 3"  (1.6 m)   Wt 93 kg   LMP  (LMP Unknown)   SpO2 98%   BMI 36.31 kg/m   Physical Exam Vitals and nursing note reviewed.  Constitutional:      General: She is not in acute distress.    Appearance: She is well-developed.  HENT:     Head: Normocephalic and atraumatic.  Eyes:     Conjunctiva/sclera: Conjunctivae normal.  Cardiovascular:     Pulses: Normal pulses.  Musculoskeletal:         General: Swelling and tenderness present.     Cervical back: Neck supple.     Comments: Edema, mild erythema, and tenderness  of R foot 1st MTP joint; no erythema or edema of tip of toe, no deformity; no scabs or break in skin  Skin:    General: Skin is warm and dry.  Neurological:     Mental Status: She is alert and oriented to person, place, and time.     Sensory: No sensory deficit.  Psychiatric:        Judgment: Judgment normal.     ED Results / Procedures / Treatments   Labs (all labs ordered are listed, but only abnormal results are displayed) Labs Reviewed - No data to display  EKG None  Radiology DG Toe Great Right  Result Date: 06/30/2020 CLINICAL DATA:  Great toe injury. EXAM: RIGHT GREAT TOE COMPARISON:  No recent. FINDINGS: Diffuse degenerative change. No acute bony or joint abnormality. No evidence of fracture dislocation. IMPRESSION: Diffuse degenerative change.  No acute abnormality. Electronically Signed   By: Marcello Moores  Register   On: 06/30/2020 14:49    Procedures Procedures   Medications Ordered in ED Medications - No data to display  ED Course  I have reviewed the triage vital signs and the nursing notes.  Pertinent  imaging results that were available during my care of the patient were reviewed by me and considered in my medical decision making (see chart for details).    MDM Rules/Calculators/A&P                          Exam is highly suggestive of gout flare with podagra.  XR negative acute. I explained the remote possibility of infection but given that symptoms are very isolated to the single joint, I think it is reasonable to treat for gout and keep a very close monitor of her symptoms especially given that her last WBC count was normal and she denies h/o diabetes. Given advanced age, will avoid NSAIDs and instead give steroid burst with taper and have patient resume her usual 5 mg daily prednisone after taper is finished.  She will follow closely with  her outpatient oncologist and understands return precautions. Final Clinical Impression(s) / ED Diagnoses Final diagnoses:  Acute gout involving toe of right foot, unspecified cause    Rx / DC Orders ED Discharge Orders         Ordered    predniSONE (STERAPRED UNI-PAK 21 TAB) 10 MG (21) TBPK tablet  Daily        06/30/20 1511           Athelene Hursey, Wenda Overland, MD 06/30/20 1519

## 2020-06-30 NOTE — Discharge Instructions (Signed)
Return to your normal dosing of daily prednisone once you complete steroid course prescribed to you from the emergency department.  Please follow-up with your outpatient provider for recheck and return to the ER if any of your symptoms worsen.

## 2020-06-30 NOTE — ED Notes (Signed)
Hit toe on Saturday.  Toe appears redden and swollen.

## 2020-07-06 ENCOUNTER — Inpatient Hospital Stay (HOSPITAL_BASED_OUTPATIENT_CLINIC_OR_DEPARTMENT_OTHER): Payer: Medicare Other | Admitting: Nurse Practitioner

## 2020-07-06 ENCOUNTER — Inpatient Hospital Stay: Payer: Medicare Other

## 2020-07-06 ENCOUNTER — Encounter: Payer: Self-pay | Admitting: Nurse Practitioner

## 2020-07-06 ENCOUNTER — Other Ambulatory Visit: Payer: Self-pay

## 2020-07-06 ENCOUNTER — Inpatient Hospital Stay: Payer: Medicare Other | Attending: Oncology

## 2020-07-06 DIAGNOSIS — C20 Malignant neoplasm of rectum: Secondary | ICD-10-CM

## 2020-07-06 DIAGNOSIS — M109 Gout, unspecified: Secondary | ICD-10-CM | POA: Diagnosis not present

## 2020-07-06 DIAGNOSIS — T451X5A Adverse effect of antineoplastic and immunosuppressive drugs, initial encounter: Secondary | ICD-10-CM | POA: Insufficient documentation

## 2020-07-06 DIAGNOSIS — E039 Hypothyroidism, unspecified: Secondary | ICD-10-CM | POA: Insufficient documentation

## 2020-07-06 DIAGNOSIS — K1232 Oral mucositis (ulcerative) due to other drugs: Secondary | ICD-10-CM | POA: Diagnosis not present

## 2020-07-06 DIAGNOSIS — Z79899 Other long term (current) drug therapy: Secondary | ICD-10-CM | POA: Insufficient documentation

## 2020-07-06 DIAGNOSIS — Z5111 Encounter for antineoplastic chemotherapy: Secondary | ICD-10-CM | POA: Insufficient documentation

## 2020-07-06 DIAGNOSIS — D6959 Other secondary thrombocytopenia: Secondary | ICD-10-CM | POA: Diagnosis not present

## 2020-07-06 DIAGNOSIS — R0609 Other forms of dyspnea: Secondary | ICD-10-CM | POA: Insufficient documentation

## 2020-07-06 DIAGNOSIS — R911 Solitary pulmonary nodule: Secondary | ICD-10-CM | POA: Diagnosis not present

## 2020-07-06 LAB — CMP (CANCER CENTER ONLY)
ALT: 17 U/L (ref 0–44)
AST: 26 U/L (ref 15–41)
Albumin: 3.5 g/dL (ref 3.5–5.0)
Alkaline Phosphatase: 56 U/L (ref 38–126)
Anion gap: 9 (ref 5–15)
BUN: 16 mg/dL (ref 8–23)
CO2: 25 mmol/L (ref 22–32)
Calcium: 8.6 mg/dL — ABNORMAL LOW (ref 8.9–10.3)
Chloride: 108 mmol/L (ref 98–111)
Creatinine: 0.76 mg/dL (ref 0.44–1.00)
GFR, Estimated: >60 mL/min (ref >60)
Glucose, Bld: 92 mg/dL (ref 70–99)
Potassium: 3.8 mmol/L (ref 3.5–5.1)
Sodium: 142 mmol/L (ref 135–145)
Total Bilirubin: 1 mg/dL (ref 0.3–1.2)
Total Protein: 6 g/dL — ABNORMAL LOW (ref 6.5–8.1)

## 2020-07-06 LAB — CBC WITH DIFFERENTIAL (CANCER CENTER ONLY)
Abs Immature Granulocytes: 0.02 K/uL (ref 0.00–0.07)
Basophils Absolute: 0 K/uL (ref 0.0–0.1)
Basophils Relative: 0 %
Eosinophils Absolute: 0.2 K/uL (ref 0.0–0.5)
Eosinophils Relative: 4 %
HCT: 36.5 % (ref 36.0–46.0)
Hemoglobin: 12.5 g/dL (ref 12.0–15.0)
Immature Granulocytes: 0 %
Lymphocytes Relative: 16 %
Lymphs Abs: 0.8 K/uL (ref 0.7–4.0)
MCH: 34.2 pg — ABNORMAL HIGH (ref 26.0–34.0)
MCHC: 34.2 g/dL (ref 30.0–36.0)
MCV: 99.7 fL (ref 80.0–100.0)
Monocytes Absolute: 0.7 K/uL (ref 0.1–1.0)
Monocytes Relative: 13 %
Neutro Abs: 3.6 K/uL (ref 1.7–7.7)
Neutrophils Relative %: 67 %
Platelet Count: 102 K/uL — ABNORMAL LOW (ref 150–400)
RBC: 3.66 MIL/uL — ABNORMAL LOW (ref 3.87–5.11)
RDW: 13.2 % (ref 11.5–15.5)
WBC Count: 5.3 K/uL (ref 4.0–10.5)
nRBC: 0 % (ref 0.0–0.2)

## 2020-07-06 MED ORDER — SODIUM CHLORIDE 0.9% FLUSH
10.0000 mL | INTRAVENOUS | Status: DC | PRN
  Administered 2020-07-06: 10 mL
  Filled 2020-07-06: qty 10

## 2020-07-06 MED ORDER — PREDNISONE 5 MG PO TABS: 5.0000 mg | ORAL_TABLET | Freq: Every day | ORAL | 2 refills | Status: DC

## 2020-07-06 MED ORDER — SODIUM CHLORIDE 0.9 % IV SOLN
1400.0000 mg/m2 | INTRAVENOUS | Status: DC
  Administered 2020-07-06: 2850 mg via INTRAVENOUS
  Filled 2020-07-06: qty 57

## 2020-07-06 MED ORDER — POTASSIUM CHLORIDE CRYS ER 20 MEQ PO TBCR: 20.0000 meq | EXTENDED_RELEASE_TABLET | Freq: Every day | ORAL | 2 refills | Status: DC

## 2020-07-06 NOTE — Progress Notes (Signed)
[place] OFFICE PROGRESS NOTE   Diagnosis: Rectal cancer  INTERVAL HISTORY:   Samantha Lutz returns as scheduled.  She completed another cycle of 5-fluorouracil 06/15/2020.  She contacted the office last week to cancel her appointment due to a toe injury.  She was seen in the emergency department on 06/30/2020, diagnosed with gout.  She is completing a steroid Dosepak.  Toe pain and swelling have improved.  In general she feels well.  No nausea or vomiting.  No mouth sores.  No diarrhea.  No hand or foot pain or redness.  No shortness of breath or cough.  Objective:  Vital signs in last 24 hours:  Blood pressure (!) 152/86, pulse 96, temperature 97.8 F (36.6 C), resp. rate 18, height '5\' 3"'  (1.6 m), weight 209 lb 9.6 oz (95.1 kg), SpO2 92 %.    HEENT: No thrush or ulcers. Resp: Lungs clear bilaterally. Cardio: Regular rate and rhythm. GI: Abdomen soft and nontender.  No hepatomegaly. Vascular: No leg edema.  Skin: Palms without erythema.  Right great toe with mild edema/erythema. Port-A-Cath without erythema.   Lab Results:  Lab Results  Component Value Date   WBC 5.3 07/06/2020   HGB 12.5 07/06/2020   HCT 36.5 07/06/2020   MCV 99.7 07/06/2020   PLT 102 (L) 07/06/2020   NEUTROABS 3.6 07/06/2020    Imaging:  No results found.  Medications: I have reviewed the patient's current medications.  Assessment/Plan: 1. Rectal cancer ? Mass at 7 cm from the anal verge on colonoscopy 08/09/2017, biopsy revealed invasive adenocarcinoma ? Staging CTs 08/17/2017-no evidence of metastatic disease, asymmetric thickening in the mid rectum ? MR pelvis 09/01/2017, T3N0 lesion beginning at 6.3 cm from the anal sphincter ? Radiation/Xeloda initiated 09/17/2017, completed 10/25/2017 ? Low anterior resection/diverting ileostomy 12/21/2017,ypT3,ypN1atumor. Lymphovascular invasion present, intact mismatch repair protein expression, treatment effect present (TRS 1). Mismatch  repair protein IHC normal;Foundation 1-KRAS/NRAS wild-type, microsatellite status and tumor mutational burden could not be determined. ? Cycle 1 adjuvant Xeloda beginning 01/21/2018 ? Cycle 2 adjuvant Xeloda beginning 02/11/2018 ? Xeloda discontinued after cycle 2 secondary to patient preference ? Ileostomy takedown 07/10/2018 ? CTs 09/07/2018-multiple live small pulmonary nodules concerning for metastatic disease ? CT chest 12/10/2018-multiple bilateral lung nodules, some have increased in size ? CT chest 04/08/2019-mild enlargement of bilateral lung nodules ? Status post SBRT multiple lung nodules 05/06/2019, 05/08/2019, 05/13/2019 ? CT chest 08/28/2019-improvement and resolution in majority of right-sided pulmonary nodules, a superior segment right lower lobe nodule has increased, no new right-sided nodules. Progressive enlargement of left-sided pulmonary nodules ? PET scan 09/09/2019-hypermetabolic right lower lobe nodule, 2 hypermetabolic left upper lobe nodules with an additional 0.7 cm left upper lobe nodule below PET resolution, groundglass opacity in the mid to right lower lobe with associated hypermetabolism consistent with postradiation change, hypermetabolic central [id number] lesion ? Cycle 1 FOLFOX 10/14/2019 ? Cycle 2FOLFOX 11/11/2019, 5-FU and oxaliplatin dose reduced secondary to neutropenia and thrombocytopenia following cycle one, G-CSF declined ? Cycle 3 FOLFOX 11/26/2019 ? Cycle 4 FOLFOX 12/11/2019, oxaliplatin held due to neutropenia and thrombocytopenia ? Cycle 5 FOLFOX 12/31/2019 ? Cycle 6 FOLFOX 01/14/2020 (oxaliplatin held, 5-fluorouracil dose reduced due to mucositis) ? CT chest 01/27/2020-improvement in left lung nodules, progressive airspace disease with traction bronchiectasis throughout the right lung, previously noted right lung nodules are obscured, mildly enlarged right paratracheal lymph nodes-not hypermetabolic on prior PET, likely reactive ? Cycle 7 FOLFOX  01/28/2020 ? Cycle 8 FOLFOX 02/11/2020 (oxaliplatin held due to neutropenia and  thrombocytopenia) ? Cycle 9 FOLFOX 02/25/2020 (oxaliplatin held due to thrombocytopenia) ? Cycle 10 FOLFOX 03/11/2020 (oxaliplatin held secondary to neuropathy) ? Cycle 11 FOLFOX 03/25/2020 (oxaliplatin held due to neuropathy) ? CT chest 04/05/2020-no new or progressive findings. Interval evolution of presumed postradiation scarring in the right perihilar lung with decrease in the more diffuse groundglass opacity seen previously. No substantial change in left lung nodules. ? Cycle125-fluorouracil 04/06/2020 ? Cycle 13 5-fluorouracil 04/21/2020 ? Cycle 14 5-fluorouracil 05/04/2020 ? Cycle 15 5-fluorouracil 05/18/2020 ? Cycle165-fluorouracil 06/01/2020 ? Cycle 17 5-fluorouracil 06/15/2020 ? 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  No definite CT findings to suggest recurrent tumor.  Stable small left upper lobe pulmonary nodules.  No new or progressive findings.  Stable small right paratracheal lymph nodes.  Stable area of irregular low-attenuation in hepatic segment 4A, site of known prior hepatic metastatic lesion.  No new or progressive findings. ? Cycle 18 5-fluorouracil 07/06/2020  2.Hypothyroid  3. 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 Neutropenia and thrombocytopenia following cycle 1 FOLFOX-plan chemotherapy dose reductions, she declined G-CSF  6. Mucositis following cycle 5 FOLFOX, 5-fluorouracil dose reduced with cycle 6  7.Right lung airspace disease/volume loss-likely toxicity from chest radiation, trial of prednisone 01/29/2020;dyspnea and cough improved 02/11/2020  Progressive cough 03/11/2020-prednisone resumed at a dose of 20 mg daily  Cough and dyspnea improved 03/25/2020-prednisone taper to 15  mg daily  Improved 04/06/2020-prednisone taper to 10 mg daily  Stable 04/21/2020-prednisone taper to 5 mg daily   Disposition: Samantha Lutz appears stable.  She has completed 17 cycles of 5-fluorouracil.  Restaging chest CT is stable, no new or progressive findings.  She continues to tolerate the chemotherapy well.  Plan to continue 5-fluorouracil on a 2-week schedule, cycle 18 today.  She agrees with this plan.  We reviewed the CBC and chemistry panel from today.  Labs adequate to proceed as above.  She is completing a steroid Dosepak for recent diagnosis of gout.  Once she has completed the Dosepak she will resume prednisone 5 mg daily.  She will return for lab, follow-up, 5-fluorouracil in 2 weeks.  She will contact the office in the interim with any problems.    Ned Card ANP/GNP-BC   07/06/2020  11:23 AM

## 2020-07-06 NOTE — Patient Instructions (Signed)

## 2020-07-06 NOTE — Patient Instructions (Signed)
[facility]  Discharge Instructions: Thank you for choosing [facility] to provide your oncology and hematology care.   If you have a lab appointment with the [facility], please go directly to the [facility] and check in at the registration area.   Wear comfortable clothing and clothing appropriate for easy access to any Portacath or PICC line.   We strive to give you quality time with your provider. You may need to reschedule your appointment if you arrive late (15 or more minutes).  Arriving late affects you and other patients whose appointments are after yours.  Also, if you miss three or more appointments without notifying the office, you may be dismissed from the clinic at the provider's discretion.      For prescription refill requests, have your pharmacy contact our office and allow 72 hours for refills to be completed.    Today you received the following chemotherapy and/or immunotherapy agents: fluoruracil pump    To help prevent nausea and vomiting after your treatment, we encourage you to take your nausea medication as directed.  BELOW ARE SYMPTOMS THAT SHOULD BE REPORTED IMMEDIATELY: . *FEVER GREATER THAN 100.4 F (38 C) OR HIGHER . *CHILLS OR SWEATING . *NAUSEA AND VOMITING THAT IS NOT CONTROLLED WITH YOUR NAUSEA MEDICATION . *UNUSUAL SHORTNESS OF BREATH . *UNUSUAL BRUISING OR BLEEDING . *URINARY PROBLEMS (pain or burning when urinating, or frequent urination) . *BOWEL PROBLEMS (unusual diarrhea, constipation, pain near the anus) . TENDERNESS IN MOUTH AND THROAT WITH OR WITHOUT PRESENCE OF ULCERS (sore throat, sores in mouth, or a toothache) . UNUSUAL RASH, SWELLING OR PAIN  . UNUSUAL VAGINAL DISCHARGE OR ITCHING   Items with * indicate a potential emergency and should be followed up as soon as possible or go to the Emergency Department if any problems should occur.  Please show the CHEMOTHERAPY ALERT CARD or IMMUNOTHERAPY  ALERT CARD at check-in to the Emergency Department and triage nurse.  Should you have questions after your visit or need to cancel or reschedule your appointment, please contact [facility]  Dept: [phone]  and follow the prompts.  Office hours are 8:00 a.m. to 4:30 p.m. Monday - Friday. Please note that voicemails left after 4:00 p.m. may not be returned until the following business day.  We are closed weekends and major holidays. You have access to a nurse at all times for urgent questions. Please call the main number to the clinic Dept: [phone] and follow the prompts.   For any non-urgent questions, you may also contact your provider using MyChart. We now offer e-Visits for anyone 44 and older to request care online for non-urgent symptoms. For details visit [email or web address].   Also download the MyChart app! Go to the app store, search "MyChart", open the app, select [facility], and log in with your MyChart username and password.  Due to Covid, a mask is required upon entering the hospital/clinic. If you do not have a mask, one will be given to you upon arrival. For doctor visits, patients may have 1 support person aged 31 or older with them. For treatment visits, patients cannot have anyone with them due to current Covid guidelines and our immunocompromised population.   Fluorouracil, 5-FU injection What is this medicine? FLUOROURACIL, 5-FU (flure oh YOOR a sil) is a chemotherapy drug. It slows the growth of cancer cells. This medicine is used to treat many types of cancer like breast cancer, colon or  rectal cancer, pancreatic cancer, and stomach cancer. This medicine may be used for other purposes; ask your health care provider or pharmacist if you have questions. COMMON BRAND NAME(S): Adrucil What should I tell my health care provider before I take this medicine? They need to know if you have any of these conditions:  blood  disorders  dihydropyrimidine dehydrogenase (DPD) deficiency  infection (especially a virus infection such as chickenpox, cold sores, or herpes)  kidney disease  liver disease  malnourished, poor nutrition  recent or ongoing radiation therapy  an unusual or allergic reaction to fluorouracil, other chemotherapy, other medicines, foods, dyes, or preservatives  pregnant or trying to get pregnant  breast-feeding How should I use this medicine? This drug is given as an infusion or injection into a vein. It is administered in a hospital or clinic by a specially trained health care professional. Talk to your pediatrician regarding the use of this medicine in children. Special care may be needed. Overdosage: If you think you have taken too much of this medicine contact a poison control center or emergency room at once. NOTE: This medicine is only for you. Do not share this medicine with others. What if I miss a dose? It is important not to miss your dose. Call your doctor or health care professional if you are unable to keep an appointment. What may interact with this medicine? Do not take this medicine with any of the following medications:  live virus vaccines This medicine may also interact with the following medications:  medicines that treat or prevent blood clots like warfarin, enoxaparin, and dalteparin This list may not describe all possible interactions. Give your health care provider a list of all the medicines, herbs, non-prescription drugs, or dietary supplements you use. Also tell them if you smoke, drink alcohol, or use illegal drugs. Some items may interact with your medicine. What should I watch for while using this medicine? Visit your doctor for checks on your progress. This drug may make you feel generally unwell. This is not uncommon, as chemotherapy can affect healthy cells as well as cancer cells. Report any side effects. Continue your course of treatment even though  you feel ill unless your doctor tells you to stop. In some cases, you may be given additional medicines to help with side effects. Follow all directions for their use. Call your doctor or health care professional for advice if you get a fever, chills or sore throat, or other symptoms of a cold or flu. Do not treat yourself. This drug decreases your body's ability to fight infections. Try to avoid being around people who are sick. This medicine may increase your risk to bruise or bleed. Call your doctor or health care professional if you notice any unusual bleeding. Be careful brushing and flossing your teeth or using a toothpick because you may get an infection or bleed more easily. If you have any dental work done, tell your dentist you are receiving this medicine. Avoid taking products that contain aspirin, acetaminophen, ibuprofen, naproxen, or ketoprofen unless instructed by your doctor. These medicines may hide a fever. Do not become pregnant while taking this medicine. Women should inform their doctor if they wish to become pregnant or think they might be pregnant. There is a potential for serious side effects to an unborn child. Talk to your health care professional or pharmacist for more information. Do not breast-feed an infant while taking this medicine. Men should inform their doctor if they wish to father a  child. This medicine may lower sperm counts. Do not treat diarrhea with over the counter products. Contact your doctor if you have diarrhea that lasts more than 2 days or if it is severe and watery. This medicine can make you more sensitive to the sun. Keep out of the sun. If you cannot avoid being in the sun, wear protective clothing and use sunscreen. Do not use sun lamps or tanning beds/booths. What side effects may I notice from receiving this medicine? Side effects that you should report to your doctor or health care professional as soon as possible:  allergic reactions like skin  rash, itching or hives, swelling of the face, lips, or tongue  low blood counts - this medicine may decrease the number of white blood cells, red blood cells and platelets. You may be at increased risk for infections and bleeding.  signs of infection - fever or chills, cough, sore throat, pain or difficulty passing urine  signs of decreased platelets or bleeding - bruising, pinpoint red spots on the skin, black, tarry stools, blood in the urine  signs of decreased red blood cells - unusually weak or tired, fainting spells, lightheadedness  breathing problems  changes in vision  chest pain  mouth sores  nausea and vomiting  pain, swelling, redness at site where injected  pain, tingling, numbness in the hands or feet  redness, swelling, or sores on hands or feet  stomach pain  unusual bleeding Side effects that usually do not require medical attention (report to your doctor or health care professional if they continue or are bothersome):  changes in finger or toe nails  diarrhea  dry or itchy skin  hair loss  headache  loss of appetite  sensitivity of eyes to the light  stomach upset  unusually teary eyes This list may not describe all possible side effects. Call your doctor for medical advice about side effects. You may report side effects to FDA at 1-800-FDA-1088. Where should I keep my medicine? This drug is given in a hospital or clinic and will not be stored at home. NOTE: This sheet is a summary. It may not cover all possible information. If you have questions about this medicine, talk to your doctor, pharmacist, or health care provider.  2021 Elsevier/Gold Standard (2018-12-17 15:00:03)

## 2020-07-08 ENCOUNTER — Inpatient Hospital Stay: Payer: Medicare Other

## 2020-07-08 ENCOUNTER — Other Ambulatory Visit: Payer: Self-pay

## 2020-07-08 VITALS — BP 130/64 | HR 96 | Temp 98.5°F | Resp 18

## 2020-07-08 DIAGNOSIS — Z5111 Encounter for antineoplastic chemotherapy: Secondary | ICD-10-CM | POA: Diagnosis not present

## 2020-07-08 DIAGNOSIS — C20 Malignant neoplasm of rectum: Secondary | ICD-10-CM

## 2020-07-08 MED ORDER — HEPARIN SOD (PORK) LOCK FLUSH 100 UNIT/ML IV SOLN
500.0000 [IU] | Freq: Once | INTRAVENOUS | Status: AC | PRN
Start: 2020-07-08 — End: 2020-07-08
  Administered 2020-07-08: 500 [IU]
  Filled 2020-07-08: qty 5

## 2020-07-08 MED ORDER — SODIUM CHLORIDE 0.9% FLUSH
10.0000 mL | INTRAVENOUS | Status: DC | PRN
  Administered 2020-07-08: 10 mL
  Filled 2020-07-08: qty 10

## 2020-07-08 NOTE — Patient Instructions (Signed)

## 2020-07-13 ENCOUNTER — Ambulatory Visit: Payer: Medicare Other

## 2020-07-13 ENCOUNTER — Other Ambulatory Visit: Payer: Medicare Other

## 2020-07-13 ENCOUNTER — Ambulatory Visit: Payer: Medicare Other | Admitting: Nurse Practitioner

## 2020-07-17 ENCOUNTER — Other Ambulatory Visit: Payer: Self-pay | Admitting: Oncology

## 2020-07-20 ENCOUNTER — Inpatient Hospital Stay: Payer: Medicare Other

## 2020-07-20 ENCOUNTER — Other Ambulatory Visit: Payer: Self-pay

## 2020-07-20 ENCOUNTER — Inpatient Hospital Stay (HOSPITAL_BASED_OUTPATIENT_CLINIC_OR_DEPARTMENT_OTHER): Payer: Medicare Other | Admitting: Nurse Practitioner

## 2020-07-20 ENCOUNTER — Encounter: Payer: Self-pay | Admitting: Nurse Practitioner

## 2020-07-20 VITALS — BP 123/72 | HR 91 | Temp 98.1°F | Resp 18 | Ht 63.0 in | Wt 208.0 lb

## 2020-07-20 DIAGNOSIS — C20 Malignant neoplasm of rectum: Secondary | ICD-10-CM

## 2020-07-20 DIAGNOSIS — Z5111 Encounter for antineoplastic chemotherapy: Secondary | ICD-10-CM | POA: Diagnosis not present

## 2020-07-20 LAB — CBC WITH DIFFERENTIAL (CANCER CENTER ONLY)
Abs Immature Granulocytes: 0.04 10*3/uL (ref 0.00–0.07)
Basophils Absolute: 0 10*3/uL (ref 0.0–0.1)
Basophils Relative: 0 %
Eosinophils Absolute: 0.3 10*3/uL (ref 0.0–0.5)
Eosinophils Relative: 3 %
HCT: 38.6 % (ref 36.0–46.0)
Hemoglobin: 13.3 g/dL (ref 12.0–15.0)
Immature Granulocytes: 1 %
Lymphocytes Relative: 16 %
Lymphs Abs: 1.2 10*3/uL (ref 0.7–4.0)
MCH: 34 pg (ref 26.0–34.0)
MCHC: 34.5 g/dL (ref 30.0–36.0)
MCV: 98.7 fL (ref 80.0–100.0)
Monocytes Absolute: 0.9 10*3/uL (ref 0.1–1.0)
Monocytes Relative: 12 %
Neutro Abs: 5.2 10*3/uL (ref 1.7–7.7)
Neutrophils Relative %: 68 %
Platelet Count: 124 10*3/uL — ABNORMAL LOW (ref 150–400)
RBC: 3.91 MIL/uL (ref 3.87–5.11)
RDW: 13.2 % (ref 11.5–15.5)
WBC Count: 7.6 10*3/uL (ref 4.0–10.5)
nRBC: 0 % (ref 0.0–0.2)

## 2020-07-20 LAB — CMP (CANCER CENTER ONLY)
ALT: 23 U/L (ref 0–44)
AST: 27 U/L (ref 15–41)
Albumin: 3.8 g/dL (ref 3.5–5.0)
Alkaline Phosphatase: 64 U/L (ref 38–126)
Anion gap: 9 (ref 5–15)
BUN: 15 mg/dL (ref 8–23)
CO2: 26 mmol/L (ref 22–32)
Calcium: 9.2 mg/dL (ref 8.9–10.3)
Chloride: 105 mmol/L (ref 98–111)
Creatinine: 0.8 mg/dL (ref 0.44–1.00)
GFR, Estimated: >60 mL/min (ref >60)
Glucose, Bld: 94 mg/dL (ref 70–99)
Potassium: 3.9 mmol/L (ref 3.5–5.1)
Sodium: 140 mmol/L (ref 135–145)
Total Bilirubin: 1.1 mg/dL (ref 0.3–1.2)
Total Protein: 6.3 g/dL — ABNORMAL LOW (ref 6.5–8.1)

## 2020-07-20 MED ORDER — SODIUM CHLORIDE 0.9 % IV SOLN
1400.0000 mg/m2 | INTRAVENOUS | Status: DC
  Administered 2020-07-20: 2850 mg via INTRAVENOUS
  Filled 2020-07-20: qty 57

## 2020-07-20 NOTE — Progress Notes (Signed)
[facility] OFFICE PROGRESS NOTE   Diagnosis: Rectal cancer  INTERVAL HISTORY:   Samantha Lutz returns as scheduled.  She completed another cycle of 5-fluorouracil 07/06/2020.  She denies nausea/vomiting.  No mouth sores.  No diarrhea.  No hand or foot pain or redness.  Stable mild dyspnea on exertion.  Objective:  Vital signs in last 24 hours:  Blood pressure 123/72, pulse 91, temperature 98.1 F (36.7 C), temperature source Oral, resp. rate 18, height _0  (1.6 m), weight 208 lb (94.3 kg), SpO2 96 %.    HEENT: No thrush or ulcers. Resp: Lungs clear bilaterally. Cardio: Regular rate and rhythm. GI: Abdomen soft and nontender.  No hepatomegaly. Vascular: No leg edema. Skin: Palms without erythema. Port-A-Cath without erythema.   Lab Results:  Lab Results  Component Value Date   WBC 7.6 07/20/2020   HGB 13.3 07/20/2020   HCT 38.6 07/20/2020   MCV 98.7 07/20/2020   PLT 124 (L) 07/20/2020   NEUTROABS 5.2 07/20/2020    Imaging:  No results found.  Medications: I have reviewed the patient's current medications.  Assessment/Plan: Rectal cancer Mass at 7 cm from the anal verge on colonoscopy 08/09/2017, biopsy revealed invasive adenocarcinoma Staging CTs 08/17/2017-no evidence of metastatic disease, asymmetric thickening in the mid rectum MR pelvis 09/01/2017, T3N0 lesion beginning at 6.3 cm from the anal sphincter Radiation/Xeloda initiated 09/17/2017, completed 10/25/2017 Low anterior resection/diverting ileostomy 12/21/2017,ypT3,ypN1a tumor.  Lymphovascular invasion present, intact mismatch repair protein expression, treatment effect present (TRS 1).  Mismatch repair protein IHC normal; Foundation 1-KRAS/NRAS wild-type, microsatellite status and tumor mutational burden could not be determined. Cycle 1 adjuvant Xeloda beginning 01/21/2018 Cycle 2 adjuvant Xeloda beginning 02/11/2018 Xeloda discontinued after cycle 2 secondary to patient preference Ileostomy  takedown 07/10/2018 CTs 09/07/2018- multiple live small pulmonary nodules concerning for metastatic disease CT chest 12/10/2018-multiple bilateral lung nodules, some have increased in size CT chest 04/08/2019-mild enlargement of bilateral lung nodules Status post SBRT multiple lung nodules 05/06/2019, 05/08/2019, 05/13/2019 CT chest 08/28/2019-improvement and resolution in majority of right-sided pulmonary nodules, a superior segment right lower lobe nodule has increased, no new right-sided nodules.  Progressive enlargement of left-sided pulmonary nodules PET scan 09/09/2019-hypermetabolic right lower lobe nodule, 2 hypermetabolic left upper lobe nodules with an additional 0.7 cm left upper lobe nodule below PET resolution, groundglass opacity in the mid to right lower lobe with associated hypermetabolism consistent with postradiation change, hypermetabolic central segment 4A liver lesion Cycle 1 FOLFOX 10/14/2019 Cycle  2FOLFOX 11/11/2019, 5-FU and oxaliplatin dose reduced secondary to neutropenia and thrombocytopenia following cycle one, G-CSF declined Cycle 3 FOLFOX 11/26/2019 Cycle 4 FOLFOX 12/11/2019, oxaliplatin held due to neutropenia and thrombocytopenia Cycle 5 FOLFOX 12/31/2019 Cycle 6 FOLFOX 01/14/2020 (oxaliplatin held, 5-fluorouracil dose reduced due to mucositis) CT chest 01/27/2020-improvement in left lung nodules, progressive airspace disease with traction bronchiectasis throughout the right lung, previously noted right lung nodules are obscured, mildly enlarged right paratracheal lymph nodes-not hypermetabolic on prior PET, likely reactive Cycle 7 FOLFOX 01/28/2020 Cycle 8 FOLFOX 02/11/2020 (oxaliplatin held due to neutropenia and thrombocytopenia) Cycle 9 FOLFOX 02/25/2020 (oxaliplatin held due to thrombocytopenia) Cycle 10 FOLFOX 03/11/2020 (oxaliplatin held secondary to neuropathy) Cycle 11 FOLFOX 03/25/2020 (oxaliplatin held due to neuropathy) CT chest 04/05/2020-no new or progressive findings.   Interval evolution of presumed postradiation scarring in the right perihilar lung with decrease in the more diffuse groundglass opacity seen previously.  No substantial change in left lung nodules. 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  No definite CT findings to suggest recurrent tumor.  Stable small left upper lobe pulmonary nodules.  No new or progressive findings.  Stable small right paratracheal lymph nodes.  Stable area of irregular low-attenuation in hepatic segment 4A, site of known prior hepatic metastatic lesion.  No new or progressive findings. Cycle 18 5-fluorouracil 07/06/2020 Cycle 19 5-fluorouracil 07/20/2020   2.   Hypothyroid   3.    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  Port-A-Cath placement 09/29/2019, interventional radiology   5.  Neutropenia and thrombocytopenia following cycle 1 FOLFOX-plan chemotherapy dose reductions, she declined G-CSF   6.  Mucositis following cycle 5 FOLFOX, 5-fluorouracil dose reduced with cycle 6   7.  Right lung airspace disease/volume loss-likely toxicity from chest radiation, trial of prednisone 01/29/2020; dyspnea and cough improved 02/11/2020 Progressive cough 03/11/2020-prednisone resumed at a dose of 20 mg daily Cough and dyspnea improved 03/25/2020-prednisone taper to 15 mg daily Improved 04/06/2020-prednisone taper to 10 mg daily Stable 04/21/2020-prednisone taper to 5 mg daily    Disposition: Samantha Lutz appears stable.  There is no clinical evidence of disease progression.  She is tolerating chemotherapy well.  Plan to continue 5-fluorouracil every 2 weeks, cycle 19 today.  We discussed switching to  maintenance Xeloda.  She prefers to continue 5-fluorouracil every 2 weeks for now.  CBC reviewed.  Counts adequate for chemotherapy.  Dr. Benay Spice reviewed the CT images from 06/27/2020 with Samantha Lutz at today's visit.  She will return for follow-up in 2 weeks.  Patient seen with Dr. Benay Spice.  Ned Card ANP/GNP-BC   07/20/2020  11:09 AM This was a shared visit with Ned Card.  Samantha Lutz appears stable.  We reviewed the recent CT images with her.  The plan is to continue 5-FU.  I was present for greater than 50% of today's visit.  I performed medical decision making.  Julieanne Manson, MD

## 2020-07-20 NOTE — Progress Notes (Signed)
Lab orders

## 2020-07-20 NOTE — Patient Instructions (Signed)
[facility]  Discharge Instructions: Thank you for choosing [facility] to provide your oncology and hematology care.   If you have a lab appointment with the [facility], please go directly to the [facility] and check in at the registration area.   Wear comfortable clothing and clothing appropriate for easy access to any Portacath or PICC line.   We strive to give you quality time with your provider. You may need to reschedule your appointment if you arrive late (15 or more minutes).  Arriving late affects you and other patients whose appointments are after yours.  Also, if you miss three or more appointments without notifying the office, you may be dismissed from the clinic at the provider's discretion.      For prescription refill requests, have your pharmacy contact our office and allow 72 hours for refills to be completed.    Today you received the following chemotherapy and/or immunotherapy agents 5 fu       To help prevent nausea and vomiting after your treatment, we encourage you to take your nausea medication as directed.  BELOW ARE SYMPTOMS THAT SHOULD BE REPORTED IMMEDIATELY: *FEVER GREATER THAN 100.4 F (38 C) OR HIGHER *CHILLS OR SWEATING *NAUSEA AND VOMITING THAT IS NOT CONTROLLED WITH YOUR NAUSEA MEDICATION *UNUSUAL SHORTNESS OF BREATH *UNUSUAL BRUISING OR BLEEDING *URINARY PROBLEMS (pain or burning when urinating, or frequent urination) *BOWEL PROBLEMS (unusual diarrhea, constipation, pain near the anus) TENDERNESS IN MOUTH AND THROAT WITH OR WITHOUT PRESENCE OF ULCERS (sore throat, sores in mouth, or a toothache) UNUSUAL RASH, SWELLING OR PAIN  UNUSUAL VAGINAL DISCHARGE OR ITCHING   Items with * indicate a potential emergency and should be followed up as soon as possible or go to the Emergency Department if any problems should occur.  Please show the CHEMOTHERAPY ALERT CARD or IMMUNOTHERAPY ALERT CARD at check-in to the  Emergency Department and triage nurse.  Should you have questions after your visit or need to cancel or reschedule your appointment, please contact [facility]  Dept: [phone]  and follow the prompts.  Office hours are 8:00 a.m. to 4:30 p.m. Monday - Friday. Please note that voicemails left after 4:00 p.m. may not be returned until the following business day.  We are closed weekends and major holidays. You have access to a nurse at all times for urgent questions. Please call the main number to the clinic Dept: [phone] and follow the prompts.   For any non-urgent questions, you may also contact your provider using MyChart. We now offer e-Visits for anyone 86 and older to request care online for non-urgent symptoms. For details visit [email or web address].   Also download the MyChart app! Go to the app store, search "MyChart", open the app, select [facility], and log in with your MyChart username and password.  Due to Covid, a mask is required upon entering the hospital/clinic. If you do not have a mask, one will be given to you upon arrival. For doctor visits, patients may have 1 support person aged 33 or older with them. For treatment visits, patients cannot have anyone with them due to current Covid guidelines and our immunocompromised population.   Fluorouracil, 5-FU injection What is this medication? FLUOROURACIL, 5-FU (flure oh YOOR a sil) is a chemotherapy drug. It slows the growth of cancer cells. This medicine is used to treat many types of cancer like breast cancer, colon or rectal cancer, pancreatic cancer, and stomachcancer. This  medicine may be used for other purposes; ask your health care provider orpharmacist if you have questions. COMMON BRAND NAME(S): Adrucil What should I tell my care team before I take this medication? They need to know if you have any of these conditions: blood disorders dihydropyrimidine dehydrogenase (DPD)  deficiency infection (especially a virus infection such as chickenpox, cold sores, or herpes) kidney disease liver disease malnourished, poor nutrition recent or ongoing radiation therapy an unusual or allergic reaction to fluorouracil, other chemotherapy, other medicines, foods, dyes, or preservatives pregnant or trying to get pregnant breast-feeding How should I use this medication? This drug is given as an infusion or injection into a vein. It is administeredin a hospital or clinic by a specially trained health care professional. Talk to your pediatrician regarding the use of this medicine in children.Special care may be needed. Overdosage: If you think you have taken too much of this medicine contact apoison control center or emergency room at once. NOTE: This medicine is only for you. Do not share this medicine with others. What if I miss a dose? It is important not to miss your dose. Call your doctor or health careprofessional if you are unable to keep an appointment. What may interact with this medication? Do not take this medicine with any of the following medications: live virus vaccines This medicine may also interact with the following medications: medicines that treat or prevent blood clots like warfarin, enoxaparin, and dalteparin This list may not describe all possible interactions. Give your health care provider a list of all the medicines, herbs, non-prescription drugs, or dietary supplements you use. Also tell them if you smoke, drink alcohol, or use illegaldrugs. Some items may interact with your medicine. What should I watch for while using this medication? Visit your doctor for checks on your progress. This drug may make you feel generally unwell. This is not uncommon, as chemotherapy can affect healthy cells as well as cancer cells. Report any side effects. Continue your course oftreatment even though you feel ill unless your doctor tells you to stop. In some cases, you  may be given additional medicines to help with side effects.Follow all directions for their use. Call your doctor or health care professional for advice if you get a fever, chills or sore throat, or other symptoms of a cold or flu. Do not treat yourself. This drug decreases your body's ability to fight infections. Try toavoid being around people who are sick. This medicine may increase your risk to bruise or bleed. Call your doctor orhealth care professional if you notice any unusual bleeding. Be careful brushing and flossing your teeth or using a toothpick because you may get an infection or bleed more easily. If you have any dental work done,tell your dentist you are receiving this medicine. Avoid taking products that contain aspirin, acetaminophen, ibuprofen, naproxen, or ketoprofen unless instructed by your doctor. These medicines may hide afever. Do not become pregnant while taking this medicine. Women should inform their doctor if they wish to become pregnant or think they might be pregnant. There is a potential for serious side effects to an unborn child. Talk to your health care professional or pharmacist for more information. Do not breast-feed aninfant while taking this medicine. Men should inform their doctor if they wish to father a child. This medicinemay lower sperm counts. Do not treat diarrhea with over the counter products. Contact your doctor ifyou have diarrhea that lasts more than 2 days or if it is severe and watery.  This medicine can make you more sensitive to the sun. Keep out of the sun. If you cannot avoid being in the sun, wear protective clothing and use sunscreen.Do not use sun lamps or tanning beds/booths. What side effects may I notice from receiving this medication? Side effects that you should report to your doctor or health care professionalas soon as possible: allergic reactions like skin rash, itching or hives, swelling of the face, lips, or tongue low blood counts -  this medicine may decrease the number of white blood cells, red blood cells and platelets. You may be at increased risk for infections and bleeding. signs of infection - fever or chills, cough, sore throat, pain or difficulty passing urine signs of decreased platelets or bleeding - bruising, pinpoint red spots on the skin, black, tarry stools, blood in the urine signs of decreased red blood cells - unusually weak or tired, fainting spells, lightheadedness breathing problems changes in vision chest pain mouth sores nausea and vomiting pain, swelling, redness at site where injected pain, tingling, numbness in the hands or feet redness, swelling, or sores on hands or feet stomach pain unusual bleeding Side effects that usually do not require medical attention (report to yourdoctor or health care professional if they continue or are bothersome): changes in finger or toe nails diarrhea dry or itchy skin hair loss headache loss of appetite sensitivity of eyes to the light stomach upset unusually teary eyes This list may not describe all possible side effects. Call your doctor for medical advice about side effects. You may report side effects to FDA at1-800-FDA-1088. Where should I keep my medication? This drug is given in a hospital or clinic and will not be stored at home. NOTE: This sheet is a summary. It may not cover all possible information. If you have questions about this medicine, talk to your doctor, pharmacist, orhealth care provider.  2022 Elsevier/Gold Standard (2018-12-17 15:00:03)

## 2020-07-22 ENCOUNTER — Inpatient Hospital Stay: Payer: Medicare Other

## 2020-07-22 ENCOUNTER — Other Ambulatory Visit: Payer: Self-pay

## 2020-07-22 VITALS — BP 123/77 | HR 85 | Temp 98.3°F | Resp 19

## 2020-07-22 DIAGNOSIS — Z5111 Encounter for antineoplastic chemotherapy: Secondary | ICD-10-CM | POA: Diagnosis not present

## 2020-07-22 DIAGNOSIS — C20 Malignant neoplasm of rectum: Secondary | ICD-10-CM

## 2020-07-22 MED ORDER — SODIUM CHLORIDE 0.9% FLUSH
10.0000 mL | INTRAVENOUS | Status: DC | PRN
Start: 2020-07-22 — End: 2020-07-22
  Administered 2020-07-22: 10 mL
  Filled 2020-07-22: qty 10

## 2020-07-22 MED ORDER — HEPARIN SOD (PORK) LOCK FLUSH 100 UNIT/ML IV SOLN
500.0000 [IU] | Freq: Once | INTRAVENOUS | Status: AC | PRN
  Administered 2020-07-22: 500 [IU]
  Filled 2020-07-22: qty 5

## 2020-08-02 ENCOUNTER — Other Ambulatory Visit: Payer: Self-pay | Admitting: Oncology

## 2020-08-03 ENCOUNTER — Inpatient Hospital Stay: Payer: Medicare Other

## 2020-08-03 ENCOUNTER — Inpatient Hospital Stay (HOSPITAL_BASED_OUTPATIENT_CLINIC_OR_DEPARTMENT_OTHER): Payer: Medicare Other | Admitting: Nurse Practitioner

## 2020-08-03 ENCOUNTER — Encounter: Payer: Self-pay | Admitting: Nurse Practitioner

## 2020-08-03 ENCOUNTER — Other Ambulatory Visit: Payer: Self-pay

## 2020-08-03 ENCOUNTER — Inpatient Hospital Stay: Payer: Medicare Other | Attending: Oncology

## 2020-08-03 VITALS — BP 152/64 | HR 93 | Temp 97.1°F | Resp 18 | Wt 211.4 lb

## 2020-08-03 DIAGNOSIS — C20 Malignant neoplasm of rectum: Secondary | ICD-10-CM | POA: Diagnosis present

## 2020-08-03 DIAGNOSIS — Z5111 Encounter for antineoplastic chemotherapy: Secondary | ICD-10-CM | POA: Insufficient documentation

## 2020-08-03 LAB — CBC WITH DIFFERENTIAL (CANCER CENTER ONLY)
Abs Immature Granulocytes: 0.02 10*3/uL (ref 0.00–0.07)
Basophils Absolute: 0 10*3/uL (ref 0.0–0.1)
Basophils Relative: 0 %
Eosinophils Absolute: 0.2 10*3/uL (ref 0.0–0.5)
Eosinophils Relative: 3 %
HCT: 36.9 % (ref 36.0–46.0)
Hemoglobin: 12.5 g/dL (ref 12.0–15.0)
Immature Granulocytes: 0 %
Lymphocytes Relative: 14 %
Lymphs Abs: 0.8 10*3/uL (ref 0.7–4.0)
MCH: 32.9 pg (ref 26.0–34.0)
MCHC: 33.9 g/dL (ref 30.0–36.0)
MCV: 97.1 fL (ref 80.0–100.0)
Monocytes Absolute: 0.7 10*3/uL (ref 0.1–1.0)
Monocytes Relative: 13 %
Neutro Abs: 3.8 10*3/uL (ref 1.7–7.7)
Neutrophils Relative %: 70 %
Platelet Count: 110 10*3/uL — ABNORMAL LOW (ref 150–400)
RBC: 3.8 MIL/uL — ABNORMAL LOW (ref 3.87–5.11)
RDW: 13.4 % (ref 11.5–15.5)
WBC Count: 5.6 10*3/uL (ref 4.0–10.5)
nRBC: 0 % (ref 0.0–0.2)

## 2020-08-03 LAB — CMP (CANCER CENTER ONLY)
ALT: 48 U/L — ABNORMAL HIGH (ref 0–44)
AST: 56 U/L — ABNORMAL HIGH (ref 15–41)
Albumin: 3.4 g/dL — ABNORMAL LOW (ref 3.5–5.0)
Alkaline Phosphatase: 83 U/L (ref 38–126)
Anion gap: 9 (ref 5–15)
BUN: 16 mg/dL (ref 8–23)
CO2: 24 mmol/L (ref 22–32)
Calcium: 8.6 mg/dL — ABNORMAL LOW (ref 8.9–10.3)
Chloride: 108 mmol/L (ref 98–111)
Creatinine: 0.82 mg/dL (ref 0.44–1.00)
GFR, Estimated: >60 mL/min (ref >60)
Glucose, Bld: 93 mg/dL (ref 70–99)
Potassium: 3.3 mmol/L — ABNORMAL LOW (ref 3.5–5.1)
Sodium: 141 mmol/L (ref 135–145)
Total Bilirubin: 1 mg/dL (ref 0.3–1.2)
Total Protein: 6.2 g/dL — ABNORMAL LOW (ref 6.5–8.1)

## 2020-08-03 MED ORDER — ALBUTEROL SULFATE HFA 108 (90 BASE) MCG/ACT IN AERS: 2.0000 | INHALATION_SPRAY | Freq: Four times a day (QID) | RESPIRATORY_TRACT | 2 refills | Status: DC | PRN

## 2020-08-03 MED ORDER — SODIUM CHLORIDE 0.9 % IV SOLN
1400.0000 mg/m2 | INTRAVENOUS | Status: DC
  Administered 2020-08-03: 2850 mg via INTRAVENOUS
  Filled 2020-08-03: qty 57

## 2020-08-03 MED ORDER — SODIUM CHLORIDE 0.9% FLUSH
10.0000 mL | INTRAVENOUS | Status: DC | PRN
  Filled 2020-08-03: qty 10

## 2020-08-03 NOTE — Patient Instructions (Signed)
[facility]  Discharge Instructions: Thank you for choosing [facility] to provide your oncology and hematology care.   If you have a lab appointment with the [facility], please go directly to the [facility] and check in at the registration area.   Wear comfortable clothing and clothing appropriate for easy access to any Portacath or PICC line.   We strive to give you quality time with your provider. You may need to reschedule your appointment if you arrive late (15 or more minutes).  Arriving late affects you and other patients whose appointments are after yours.  Also, if you miss three or more appointments without notifying the office, you may be dismissed from the clinic at the provider's discretion.      For prescription refill requests, have your pharmacy contact our office and allow 72 hours for refills to be completed.    Today you received the following chemotherapy and/or immunotherapy agents: fluoruracil pump    To help prevent nausea and vomiting after your treatment, we encourage you to take your nausea medication as directed.  BELOW ARE SYMPTOMS THAT SHOULD BE REPORTED IMMEDIATELY: *FEVER GREATER THAN 100.4 F (38 C) OR HIGHER *CHILLS OR SWEATING *NAUSEA AND VOMITING THAT IS NOT CONTROLLED WITH YOUR NAUSEA MEDICATION *UNUSUAL SHORTNESS OF BREATH *UNUSUAL BRUISING OR BLEEDING *URINARY PROBLEMS (pain or burning when urinating, or frequent urination) *BOWEL PROBLEMS (unusual diarrhea, constipation, pain near the anus) TENDERNESS IN MOUTH AND THROAT WITH OR WITHOUT PRESENCE OF ULCERS (sore throat, sores in mouth, or a toothache) UNUSUAL RASH, SWELLING OR PAIN  UNUSUAL VAGINAL DISCHARGE OR ITCHING   Items with * indicate a potential emergency and should be followed up as soon as possible or go to the Emergency Department if any problems should occur.  Please show the CHEMOTHERAPY ALERT CARD or IMMUNOTHERAPY ALERT CARD at check-in  to the Emergency Department and triage nurse.  Should you have questions after your visit or need to cancel or reschedule your appointment, please contact [facility]  Dept: [phone]  and follow the prompts.  Office hours are 8:00 a.m. to 4:30 p.m. Monday - Friday. Please note that voicemails left after 4:00 p.m. may not be returned until the following business day.  We are closed weekends and major holidays. You have access to a nurse at all times for urgent questions. Please call the main number to the clinic Dept: [phone] and follow the prompts.   For any non-urgent questions, you may also contact your provider using MyChart. We now offer e-Visits for anyone 53 and older to request care online for non-urgent symptoms. For details visit [email or web address].   Also download the MyChart app! Go to the app store, search "MyChart", open the app, select [facility], and log in with your MyChart username and password.  Due to Covid, a mask is required upon entering the hospital/clinic. If you do not have a mask, one will be given to you upon arrival. For doctor visits, patients may have 1 support person aged 15 or older with them. For treatment visits, patients cannot have anyone with them due to current Covid guidelines and our immunocompromised population.   Fluorouracil, 5-FU injection What is this medicine? FLUOROURACIL, 5-FU (flure oh YOOR a sil) is a chemotherapy drug. It slows the growth of cancer cells. This medicine is used to treat many types of cancer like breast cancer, colon or rectal cancer, pancreatic cancer, and stomach cancer. This medicine may  be used for other purposes; ask your health care provider or pharmacist if you have questions. COMMON BRAND NAME(S): Adrucil What should I tell my health care provider before I take this medicine? They need to know if you have any of these conditions: blood disorders dihydropyrimidine dehydrogenase (DPD)  deficiency infection (especially a virus infection such as chickenpox, cold sores, or herpes) kidney disease liver disease malnourished, poor nutrition recent or ongoing radiation therapy an unusual or allergic reaction to fluorouracil, other chemotherapy, other medicines, foods, dyes, or preservatives pregnant or trying to get pregnant breast-feeding How should I use this medicine? This drug is given as an infusion or injection into a vein. It is administered in a hospital or clinic by a specially trained health care professional. Talk to your pediatrician regarding the use of this medicine in children. Special care may be needed. Overdosage: If you think you have taken too much of this medicine contact a poison control center or emergency room at once. NOTE: This medicine is only for you. Do not share this medicine with others. What if I miss a dose? It is important not to miss your dose. Call your doctor or health care professional if you are unable to keep an appointment. What may interact with this medicine? Do not take this medicine with any of the following medications: live virus vaccines This medicine may also interact with the following medications: medicines that treat or prevent blood clots like warfarin, enoxaparin, and dalteparin This list may not describe all possible interactions. Give your health care provider a list of all the medicines, herbs, non-prescription drugs, or dietary supplements you use. Also tell them if you smoke, drink alcohol, or use illegal drugs. Some items may interact with your medicine. What should I watch for while using this medicine? Visit your doctor for checks on your progress. This drug may make you feel generally unwell. This is not uncommon, as chemotherapy can affect healthy cells as well as cancer cells. Report any side effects. Continue your course of treatment even though you feel ill unless your doctor tells you to stop. In some cases, you  may be given additional medicines to help with side effects. Follow all directions for their use. Call your doctor or health care professional for advice if you get a fever, chills or sore throat, or other symptoms of a cold or flu. Do not treat yourself. This drug decreases your body's ability to fight infections. Try to avoid being around people who are sick. This medicine may increase your risk to bruise or bleed. Call your doctor or health care professional if you notice any unusual bleeding. Be careful brushing and flossing your teeth or using a toothpick because you may get an infection or bleed more easily. If you have any dental work done, tell your dentist you are receiving this medicine. Avoid taking products that contain aspirin, acetaminophen, ibuprofen, naproxen, or ketoprofen unless instructed by your doctor. These medicines may hide a fever. Do not become pregnant while taking this medicine. Women should inform their doctor if they wish to become pregnant or think they might be pregnant. There is a potential for serious side effects to an unborn child. Talk to your health care professional or pharmacist for more information. Do not breast-feed an infant while taking this medicine. Men should inform their doctor if they wish to father a child. This medicine may lower sperm counts. Do not treat diarrhea with over the counter products. Contact your doctor if you have  diarrhea that lasts more than 2 days or if it is severe and watery. This medicine can make you more sensitive to the sun. Keep out of the sun. If you cannot avoid being in the sun, wear protective clothing and use sunscreen. Do not use sun lamps or tanning beds/booths. What side effects may I notice from receiving this medicine? Side effects that you should report to your doctor or health care professional as soon as possible: allergic reactions like skin rash, itching or hives, swelling of the face, lips, or tongue low blood  counts - this medicine may decrease the number of white blood cells, red blood cells and platelets. You may be at increased risk for infections and bleeding. signs of infection - fever or chills, cough, sore throat, pain or difficulty passing urine signs of decreased platelets or bleeding - bruising, pinpoint red spots on the skin, black, tarry stools, blood in the urine signs of decreased red blood cells - unusually weak or tired, fainting spells, lightheadedness breathing problems changes in vision chest pain mouth sores nausea and vomiting pain, swelling, redness at site where injected pain, tingling, numbness in the hands or feet redness, swelling, or sores on hands or feet stomach pain unusual bleeding Side effects that usually do not require medical attention (report to your doctor or health care professional if they continue or are bothersome): changes in finger or toe nails diarrhea dry or itchy skin hair loss headache loss of appetite sensitivity of eyes to the light stomach upset unusually teary eyes This list may not describe all possible side effects. Call your doctor for medical advice about side effects. You may report side effects to FDA at 1-800-FDA-1088. Where should I keep my medicine? This drug is given in a hospital or clinic and will not be stored at home. NOTE: This sheet is a summary. It may not cover all possible information. If you have questions about this medicine, talk to your doctor, pharmacist, or health care provider.  2021 Elsevier/Gold Standard (2018-12-17 15:00:03)

## 2020-08-03 NOTE — Progress Notes (Signed)
[facility] OFFICE PROGRESS NOTE   Diagnosis: Rectal cancer  INTERVAL HISTORY:   Samantha Lutz returns as scheduled.  She completed another cycle of 5-fluorouracil 07/20/2020.  She denies nausea/vomiting.  No mouth sores.  No diarrhea.  No hand or foot pain or redness.  Stable mild dyspnea on exertion.  Objective:  Vital signs in last 24 hours:  Blood pressure (!) 152/64, pulse 93, temperature (!) 97.1 F (36.2 C), temperature source Temporal, resp. rate 18, weight 211 lb 6.4 oz (95.9 kg), SpO2 96 %.    HEENT: No thrush or ulcers. Resp: Lungs clear bilaterally. Cardio: Regular rate and rhythm. GI: Abdomen soft and nontender.  No hepatomegaly. Vascular: No leg edema. Skin: Palms without erythema. Port-A-Cath without erythema.   Lab Results:  Lab Results  Component Value Date   WBC 5.6 08/03/2020   HGB 12.5 08/03/2020   HCT 36.9 08/03/2020   MCV 97.1 08/03/2020   PLT 110 (L) 08/03/2020   NEUTROABS 3.8 08/03/2020    Imaging:  No results found.  Medications: I have reviewed the patient's current medications.  Assessment/Plan: Rectal cancer Mass at 7 cm from the anal verge on colonoscopy 08/09/2017, biopsy revealed invasive adenocarcinoma Staging CTs 08/17/2017-no evidence of metastatic disease, asymmetric thickening in the mid rectum MR pelvis 09/01/2017, T3N0 lesion beginning at 6.3 cm from the anal sphincter Radiation/Xeloda initiated 09/17/2017, completed 10/25/2017 Low anterior resection/diverting ileostomy 12/21/2017,ypT3,ypN1a tumor.  Lymphovascular invasion present, intact mismatch repair protein expression, treatment effect present (TRS 1).  Mismatch repair protein IHC normal; Foundation 1-KRAS/NRAS wild-type, microsatellite status and tumor mutational burden could not be determined. Cycle 1 adjuvant Xeloda beginning 01/21/2018 Cycle 2 adjuvant Xeloda beginning 02/11/2018 Xeloda discontinued after cycle 2 secondary to patient preference Ileostomy  takedown 07/10/2018 CTs 09/07/2018- multiple live small pulmonary nodules concerning for metastatic disease CT chest 12/10/2018-multiple bilateral lung nodules, some have increased in size CT chest 04/08/2019-mild enlargement of bilateral lung nodules Status post SBRT multiple lung nodules 05/06/2019, 05/08/2019, 05/13/2019 CT chest 08/28/2019-improvement and resolution in majority of right-sided pulmonary nodules, a superior segment right lower lobe nodule has increased, no new right-sided nodules.  Progressive enlargement of left-sided pulmonary nodules PET scan 09/09/2019-hypermetabolic right lower lobe nodule, 2 hypermetabolic left upper lobe nodules with an additional 0.7 cm left upper lobe nodule below PET resolution, groundglass opacity in the mid to right lower lobe with associated hypermetabolism consistent with postradiation change, hypermetabolic central segment 4A liver lesion Cycle 1 FOLFOX 10/14/2019 Cycle  2FOLFOX 11/11/2019, 5-FU and oxaliplatin dose reduced secondary to neutropenia and thrombocytopenia following cycle one, G-CSF declined Cycle 3 FOLFOX 11/26/2019 Cycle 4 FOLFOX 12/11/2019, oxaliplatin held due to neutropenia and thrombocytopenia Cycle 5 FOLFOX 12/31/2019 Cycle 6 FOLFOX 01/14/2020 (oxaliplatin held, 5-fluorouracil dose reduced due to mucositis) CT chest 01/27/2020-improvement in left lung nodules, progressive airspace disease with traction bronchiectasis throughout the right lung, previously noted right lung nodules are obscured, mildly enlarged right paratracheal lymph nodes-not hypermetabolic on prior PET, likely reactive Cycle 7 FOLFOX 01/28/2020 Cycle 8 FOLFOX 02/11/2020 (oxaliplatin held due to neutropenia and thrombocytopenia) Cycle 9 FOLFOX 02/25/2020 (oxaliplatin held due to thrombocytopenia) Cycle 10 FOLFOX 03/11/2020 (oxaliplatin held secondary to neuropathy) Cycle 11 FOLFOX 03/25/2020 (oxaliplatin held due to neuropathy) CT chest 04/05/2020-no new or progressive findings.   Interval evolution of presumed postradiation scarring in the right perihilar lung with decrease in the more diffuse groundglass opacity seen previously.  No substantial change in left lung nodules. 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  No definite CT findings to suggest recurrent tumor.  Stable small left upper lobe pulmonary nodules.  No new or progressive findings.  Stable small right paratracheal lymph nodes.  Stable area of irregular low-attenuation in hepatic segment 4A, site of known prior hepatic metastatic lesion.  No new or progressive findings. Cycle 18 5-fluorouracil 07/06/2020 Cycle 19 5-fluorouracil 07/20/2020 Cycle 20 5-fluorouracil 08/03/2020   2.   Hypothyroid   3.    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  Port-A-Cath placement 09/29/2019, interventional radiology   5.  Neutropenia and thrombocytopenia following cycle 1 FOLFOX-plan chemotherapy dose reductions, she declined G-CSF   6.  Mucositis following cycle 5 FOLFOX, 5-fluorouracil dose reduced with cycle 6   7.  Right lung airspace disease/volume loss-likely toxicity from chest radiation, trial of prednisone 01/29/2020; dyspnea and cough improved 02/11/2020 Progressive cough 03/11/2020-prednisone resumed at a dose of 20 mg daily Cough and dyspnea improved 03/25/2020-prednisone taper to 15 mg daily Improved 04/06/2020-prednisone taper to 10 mg daily Stable 04/21/2020-prednisone taper to 5 mg daily  Disposition: Samantha Lutz appears stable.  There is no clinical evidence of disease progression.  Plan to continue 5-fluorouracil every 2 weeks, cycle 20 today.  We reviewed the CBC from today.   Counts adequate to proceed with treatment.  She has stable mild thrombocytopenia.  She understands to contact the office with bleeding.  She will return for lab, follow-up and 5-fluorouracil in 2 weeks.  Ned Card ANP/GNP-BC   08/03/2020  1:13 PM

## 2020-08-05 ENCOUNTER — Inpatient Hospital Stay: Payer: Medicare Other

## 2020-08-05 ENCOUNTER — Other Ambulatory Visit: Payer: Self-pay

## 2020-08-05 VITALS — BP 156/78 | HR 92 | Temp 98.4°F | Resp 20

## 2020-08-05 DIAGNOSIS — C20 Malignant neoplasm of rectum: Secondary | ICD-10-CM

## 2020-08-05 DIAGNOSIS — Z5111 Encounter for antineoplastic chemotherapy: Secondary | ICD-10-CM | POA: Diagnosis not present

## 2020-08-05 MED ORDER — HEPARIN SOD (PORK) LOCK FLUSH 100 UNIT/ML IV SOLN
500.0000 [IU] | Freq: Once | INTRAVENOUS | Status: AC | PRN
  Administered 2020-08-05: 500 [IU]
  Filled 2020-08-05: qty 5

## 2020-08-05 MED ORDER — SODIUM CHLORIDE 0.9% FLUSH
10.0000 mL | INTRAVENOUS | Status: DC | PRN
  Administered 2020-08-05: 10 mL
  Filled 2020-08-05: qty 10

## 2020-08-15 ENCOUNTER — Other Ambulatory Visit: Payer: Self-pay | Admitting: Oncology

## 2020-08-17 ENCOUNTER — Inpatient Hospital Stay: Payer: Medicare Other

## 2020-08-17 ENCOUNTER — Inpatient Hospital Stay (HOSPITAL_BASED_OUTPATIENT_CLINIC_OR_DEPARTMENT_OTHER): Payer: Medicare Other | Admitting: Oncology

## 2020-08-17 ENCOUNTER — Other Ambulatory Visit: Payer: Self-pay

## 2020-08-17 VITALS — BP 160/92 | HR 87 | Temp 98.2°F | Resp 18 | Ht 63.0 in | Wt 212.8 lb

## 2020-08-17 DIAGNOSIS — C20 Malignant neoplasm of rectum: Secondary | ICD-10-CM | POA: Diagnosis not present

## 2020-08-17 DIAGNOSIS — Z5111 Encounter for antineoplastic chemotherapy: Secondary | ICD-10-CM | POA: Diagnosis not present

## 2020-08-17 LAB — CBC WITH DIFFERENTIAL (CANCER CENTER ONLY)
Abs Immature Granulocytes: 0.02 10*3/uL (ref 0.00–0.07)
Basophils Absolute: 0 10*3/uL (ref 0.0–0.1)
Basophils Relative: 0 %
Eosinophils Absolute: 0.2 10*3/uL (ref 0.0–0.5)
Eosinophils Relative: 3 %
HCT: 36.6 % (ref 36.0–46.0)
Hemoglobin: 12.3 g/dL (ref 12.0–15.0)
Immature Granulocytes: 0 %
Lymphocytes Relative: 12 %
Lymphs Abs: 0.7 10*3/uL (ref 0.7–4.0)
MCH: 32.9 pg (ref 26.0–34.0)
MCHC: 33.6 g/dL (ref 30.0–36.0)
MCV: 97.9 fL (ref 80.0–100.0)
Monocytes Absolute: 0.7 10*3/uL (ref 0.1–1.0)
Monocytes Relative: 13 %
Neutro Abs: 3.9 10*3/uL (ref 1.7–7.7)
Neutrophils Relative %: 72 %
Platelet Count: 98 10*3/uL — ABNORMAL LOW (ref 150–400)
RBC: 3.74 MIL/uL — ABNORMAL LOW (ref 3.87–5.11)
RDW: 13.6 % (ref 11.5–15.5)
WBC Count: 5.5 10*3/uL (ref 4.0–10.5)
nRBC: 0 % (ref 0.0–0.2)

## 2020-08-17 LAB — CMP (CANCER CENTER ONLY)
ALT: 44 U/L (ref 0–44)
AST: 45 U/L — ABNORMAL HIGH (ref 15–41)
Albumin: 3.6 g/dL (ref 3.5–5.0)
Alkaline Phosphatase: 85 U/L (ref 38–126)
Anion gap: 9 (ref 5–15)
BUN: 14 mg/dL (ref 8–23)
CO2: 25 mmol/L (ref 22–32)
Calcium: 8.8 mg/dL — ABNORMAL LOW (ref 8.9–10.3)
Chloride: 107 mmol/L (ref 98–111)
Creatinine: 0.73 mg/dL (ref 0.44–1.00)
GFR, Estimated: >60 mL/min (ref >60)
Glucose, Bld: 91 mg/dL (ref 70–99)
Potassium: 3.9 mmol/L (ref 3.5–5.1)
Sodium: 141 mmol/L (ref 135–145)
Total Bilirubin: 1.3 mg/dL — ABNORMAL HIGH (ref 0.3–1.2)
Total Protein: 6.2 g/dL — ABNORMAL LOW (ref 6.5–8.1)

## 2020-08-17 MED ORDER — SODIUM CHLORIDE 0.9 % IV SOLN
1400.0000 mg/m2 | INTRAVENOUS | Status: DC
  Administered 2020-08-17: 2850 mg via INTRAVENOUS
  Filled 2020-08-17: qty 57

## 2020-08-17 NOTE — Patient Instructions (Signed)
[facility]  Discharge Instructions: Thank you for choosing [facility] to provide your oncology and hematology care.   If you have a lab appointment with the [facility], please go directly to the [facility] and check in at the registration area.   Wear comfortable clothing and clothing appropriate for easy access to any Portacath or PICC line.   We strive to give you quality time with your provider. You may need to reschedule your appointment if you arrive late (15 or more minutes).  Arriving late affects you and other patients whose appointments are after yours.  Also, if you miss three or more appointments without notifying the office, you may be dismissed from the clinic at the provider's discretion.      For prescription refill requests, have your pharmacy contact our office and allow 72 hours for refills to be completed.    Today you received the following chemotherapy and/or immunotherapy agents 5FU      To help prevent nausea and vomiting after your treatment, we encourage you to take your nausea medication as directed.  BELOW ARE SYMPTOMS THAT SHOULD BE REPORTED IMMEDIATELY: *FEVER GREATER THAN 100.4 F (38 C) OR HIGHER *CHILLS OR SWEATING *NAUSEA AND VOMITING THAT IS NOT CONTROLLED WITH YOUR NAUSEA MEDICATION *UNUSUAL SHORTNESS OF BREATH *UNUSUAL BRUISING OR BLEEDING *URINARY PROBLEMS (pain or burning when urinating, or frequent urination) *BOWEL PROBLEMS (unusual diarrhea, constipation, pain near the anus) TENDERNESS IN MOUTH AND THROAT WITH OR WITHOUT PRESENCE OF ULCERS (sore throat, sores in mouth, or a toothache) UNUSUAL RASH, SWELLING OR PAIN  UNUSUAL VAGINAL DISCHARGE OR ITCHING   Items with * indicate a potential emergency and should be followed up as soon as possible or go to the Emergency Department if any problems should occur.  Please show the CHEMOTHERAPY ALERT CARD or IMMUNOTHERAPY ALERT CARD at check-in to the  Emergency Department and triage nurse.  Should you have questions after your visit or need to cancel or reschedule your appointment, please contact [facility]  Dept: [phone]  and follow the prompts.  Office hours are 8:00 a.m. to 4:30 p.m. Monday - Friday. Please note that voicemails left after 4:00 p.m. may not be returned until the following business day.  We are closed weekends and major holidays. You have access to a nurse at all times for urgent questions. Please call the main number to the clinic Dept: [phone] and follow the prompts.   For any non-urgent questions, you may also contact your provider using MyChart. We now offer e-Visits for anyone 40 and older to request care online for non-urgent symptoms. For details visit [email or web address].   Also download the MyChart app! Go to the app store, search "MyChart", open the app, select [facility], and log in with your MyChart username and password.  Due to Covid, a mask is required upon entering the hospital/clinic. If you do not have a mask, one will be given to you upon arrival. For doctor visits, patients may have 1 support person aged 56 or older with them. For treatment visits, patients cannot have anyone with them due to current Covid guidelines and our immunocompromised population.

## 2020-08-17 NOTE — Progress Notes (Signed)
[facility] OFFICE PROGRESS NOTE   Diagnosis: Rectal cancer  INTERVAL HISTORY:   Samantha Lutz returns as scheduled.  He completed another cycle of 5-FU on 08/03/2020.  No mouth sores, nausea, or diarrhea.  She continues to have exertional dyspnea.  She is able to perform activities in the home.  The inhaler helps.  She continues prednisone at a dose of 5 mg daily.  Objective:  Vital signs in last 24 hours:  Blood pressure (!) 160/92, pulse 87, temperature 98.2 F (36.8 C), temperature source Oral, resp. rate 18, height $RemoveBe'5\' 3"'LhLDNwldX$  (1.6 m), weight 212 lb 12.8 oz (96.5 kg), SpO2 96 %.    HEENT: No thrush or ulcers Resp: Mild bilateral inspiratory wheeze, decreased breath sounds at the right lower posterior chest, no respiratory distress Cardio: Regular rate and rhythm GI: No hepatosplenomegaly Vascular: No leg edema  Portacath/PICC-without erythema  Lab Results:  Lab Results  Component Value Date   WBC 5.5 08/17/2020   HGB 12.3 08/17/2020   HCT 36.6 08/17/2020   MCV 97.9 08/17/2020   PLT 98 (L) 08/17/2020   NEUTROABS 3.9 08/17/2020    CMP  Lab Results  Component Value Date   NA 141 08/17/2020   K 3.9 08/17/2020   CL 107 08/17/2020   CO2 25 08/17/2020   GLUCOSE 91 08/17/2020   BUN 14 08/17/2020   CREATININE 0.73 08/17/2020   CALCIUM 8.8 (L) 08/17/2020   PROT 6.2 (L) 08/17/2020   ALBUMIN 3.6 08/17/2020   AST 45 (H) 08/17/2020   ALT 44 08/17/2020   ALKPHOS 85 08/17/2020   BILITOT 1.3 (H) 08/17/2020   GFRNONAA >60 08/17/2020   GFRAA >60 10/28/2019    Lab Results  Component Value Date   CEA1 <1.00 06/01/2020   CEA <1.00 06/01/2020   Medications: I have reviewed the patient's current medications.   Assessment/Plan: Rectal cancer Mass at 7 cm from the anal verge on colonoscopy 08/09/2017, biopsy revealed invasive adenocarcinoma Staging CTs 08/17/2017-no evidence of metastatic disease, asymmetric thickening in the mid rectum MR pelvis 09/01/2017, T3N0  lesion beginning at 6.3 cm from the anal sphincter Radiation/Xeloda initiated 09/17/2017, completed 10/25/2017 Low anterior resection/diverting ileostomy 12/21/2017,ypT3,ypN1a tumor.  Lymphovascular invasion present, intact mismatch repair protein expression, treatment effect present (TRS 1).  Mismatch repair protein IHC normal; Foundation 1-KRAS/NRAS wild-type, microsatellite status and tumor mutational burden could not be determined. Cycle 1 adjuvant Xeloda beginning 01/21/2018 Cycle 2 adjuvant Xeloda beginning 02/11/2018 Xeloda discontinued after cycle 2 secondary to patient preference Ileostomy takedown 07/10/2018 CTs 09/07/2018- multiple live small pulmonary nodules concerning for metastatic disease CT chest 12/10/2018-multiple bilateral lung nodules, some have increased in size CT chest 04/08/2019-mild enlargement of bilateral lung nodules Status post SBRT multiple lung nodules 05/06/2019, 05/08/2019, 05/13/2019 CT chest 08/28/2019-improvement and resolution in majority of right-sided pulmonary nodules, a superior segment right lower lobe nodule has increased, no new right-sided nodules.  Progressive enlargement of left-sided pulmonary nodules PET scan 09/09/2019-hypermetabolic right lower lobe nodule, 2 hypermetabolic left upper lobe nodules with an additional 0.7 cm left upper lobe nodule below PET resolution, groundglass opacity in the mid to right lower lobe with associated hypermetabolism consistent with postradiation change, hypermetabolic central segment 4A liver lesion Cycle 1 FOLFOX 10/14/2019 Cycle  2FOLFOX 11/11/2019, 5-FU and oxaliplatin dose reduced secondary to neutropenia and thrombocytopenia following cycle one, G-CSF declined Cycle 3 FOLFOX 11/26/2019 Cycle 4 FOLFOX 12/11/2019, oxaliplatin held due to neutropenia and thrombocytopenia Cycle 5 FOLFOX 12/31/2019 Cycle 6 FOLFOX 01/14/2020 (oxaliplatin held, 5-fluorouracil dose reduced  due to mucositis) CT chest 01/27/2020-improvement in left  lung nodules, progressive airspace disease with traction bronchiectasis throughout the right lung, previously noted right lung nodules are obscured, mildly enlarged right paratracheal lymph nodes-not hypermetabolic on prior PET, likely reactive Cycle 7 FOLFOX 01/28/2020 Cycle 8 FOLFOX 02/11/2020 (oxaliplatin held due to neutropenia and thrombocytopenia) Cycle 9 FOLFOX 02/25/2020 (oxaliplatin held due to thrombocytopenia) Cycle 10 FOLFOX 03/11/2020 (oxaliplatin held secondary to neuropathy) Cycle 11 FOLFOX 03/25/2020 (oxaliplatin held due to neuropathy) CT chest 04/05/2020-no new or progressive findings.  Interval evolution of presumed postradiation scarring in the right perihilar lung with decrease in the more diffuse groundglass opacity seen previously.  No substantial change in left lung nodules. 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  No definite CT findings to suggest recurrent tumor.  Stable small left upper lobe pulmonary nodules.  No new or progressive findings.  Stable small right paratracheal lymph nodes.  Stable area of irregular low-attenuation in hepatic segment 4A, site of known prior hepatic metastatic lesion.  No new or progressive findings. Cycle 18 5-fluorouracil 07/06/2020 Cycle 19 5-fluorouracil 07/20/2020 Cycle 20 5-fluorouracil 08/03/2020 Cycle 21 5-fluorouracil 08/17/2020    2.   Hypothyroid   3.    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  Port-A-Cath placement 09/29/2019, interventional radiology   5.  Neutropenia and thrombocytopenia following cycle 1 FOLFOX-plan chemotherapy dose reductions, she declined G-CSF   6.  Mucositis following cycle 5  FOLFOX, 5-fluorouracil dose reduced with cycle 6   7.  Right lung airspace disease/volume loss-likely toxicity from chest radiation, trial of prednisone 01/29/2020; dyspnea and cough improved 02/11/2020 Progressive cough 03/11/2020-prednisone resumed at a dose of 20 mg daily Cough and dyspnea improved 03/25/2020-prednisone taper to 15 mg daily Improved 04/06/2020-prednisone taper to 10 mg daily Stable 04/21/2020-prednisone taper to 5 mg daily    Disposition: Samantha Lutz appears unchanged.  She is tolerating the 5-fluorouracil well.  We discussed switching to capecitabine.  She prefers continuing 5-FU on the current schedule.  She will complete another cycle of infusional 5-FU today.  She will return for an office visit and chemotherapy in 2 weeks.  We will plan for a restaging chest CT in approximately 2 months.  Samantha Coder, MD  08/17/2020  12:21 PM

## 2020-08-17 NOTE — Patient Instructions (Signed)
Implanted Port Home Guide An implanted port is a device that is placed under the skin. It is usually placed in the chest. The device can be used to give IV medicine, to take blood, or for dialysis. You may have an implanted port if: You need IV medicine that would be irritating to the small veins in your hands or arms. You need IV medicines, such as antibiotics, for a long period of time. You need IV nutrition for a long period of time. You need dialysis. When you have a port, your health care provider can choose to use the port instead of veins in your arms for these procedures. You may have fewer limitations when using a port than you would if you used other types of long-term IVs, and you will likely be able to return to normal activities afteryour incision heals. An implanted port has two main parts: Reservoir. The reservoir is the part where a needle is inserted to give medicines or draw blood. The reservoir is round. After it is placed, it appears as a small, raised area under your skin. Catheter. The catheter is a thin, flexible tube that connects the reservoir to a vein. Medicine that is inserted into the reservoir goes into the catheter and then into the vein. How is my port accessed? To access your port: A numbing cream may be placed on the skin over the port site. Your health care provider will put on a mask and sterile gloves. The skin over your port will be cleaned carefully with a germ-killing soap and allowed to dry. Your health care provider will gently pinch the port and insert a needle into it. Your health care provider will check for a blood return to make sure the port is in the vein and is not clogged. If your port needs to remain accessed to get medicine continuously (constant infusion), your health care provider will place a clear bandage (dressing) over the needle site. The dressing and needle will need to be changed every week, or as told by your health care provider. What  is flushing? Flushing helps keep the port from getting clogged. Follow instructions from your health care provider about how and when to flush the port. Ports are usually flushed with saline solution or a medicine called heparin. The need for flushing will depend on how the port is used: If the port is only used from time to time to give medicines or draw blood, the port may need to be flushed: Before and after medicines have been given. Before and after blood has been drawn. As part of routine maintenance. Flushing may be recommended every 4-6 weeks. If a constant infusion is running, the port may not need to be flushed. Throw away any syringes in a disposal container that is meant for sharp items (sharps container). You can buy a sharps container from a pharmacy, or you can make one by using an empty hard plastic bottle with a cover. How long will my port stay implanted? The port can stay in for as long as your health care provider thinks it is needed. When it is time for the port to come out, a surgery will be done to remove it. The surgery will be similar to the procedure that was done to putthe port in. Follow these instructions at home:  Flush your port as told by your health care provider. If you need an infusion over several days, follow instructions from your health care provider about how to take   care of your port site. Make sure you: Wash your hands with soap and water before you change your dressing. If soap and water are not available, use alcohol-based hand sanitizer. Change your dressing as told by your health care provider. Place any used dressings or infusion bags into a plastic bag. Throw that bag in the trash. Keep the dressing that covers the needle clean and dry. Do not get it wet. Do not use scissors or sharp objects near the tube. Keep the tube clamped, unless it is being used. Check your port site every day for signs of infection. Check for: Redness, swelling, or  pain. Fluid or blood. Pus or a bad smell. Protect the skin around the port site. Avoid wearing bra straps that rub or irritate the site. Protect the skin around your port from seat belts. Place a soft pad over your chest if needed. Bathe or shower as told by your health care provider. The site may get wet as long as you are not actively receiving an infusion. Return to your normal activities as told by your health care provider. Ask your health care provider what activities are safe for you. Carry a medical alert card or wear a medical alert bracelet at all times. This will let health care providers know that you have an implanted port in case of an emergency. Get help right away if: You have redness, swelling, or pain at the port site. You have fluid or blood coming from your port site. You have pus or a bad smell coming from the port site. You have a fever. Summary Implanted ports are usually placed in the chest for long-term IV access. Follow instructions from your health care provider about flushing the port and changing bandages (dressings). Take care of the area around your port by avoiding clothing that puts pressure on the area, and by watching for signs of infection. Protect the skin around your port from seat belts. Place a soft pad over your chest if needed. Get help right away if you have a fever or you have redness, swelling, pain, drainage, or a bad smell at the port site. This information is not intended to replace advice given to you by your health care provider. Make sure you discuss any questions you have with your healthcare provider. Document Revised: 06/02/2019 Document Reviewed: 06/02/2019 Elsevier Patient Education  2022 [employer]  

## 2020-08-19 ENCOUNTER — Other Ambulatory Visit: Payer: Self-pay

## 2020-08-19 ENCOUNTER — Inpatient Hospital Stay: Payer: Medicare Other

## 2020-08-19 VITALS — BP 149/91 | HR 85 | Temp 98.3°F | Resp 20

## 2020-08-19 DIAGNOSIS — C20 Malignant neoplasm of rectum: Secondary | ICD-10-CM

## 2020-08-19 DIAGNOSIS — Z5111 Encounter for antineoplastic chemotherapy: Secondary | ICD-10-CM | POA: Diagnosis not present

## 2020-08-19 MED ORDER — HEPARIN SOD (PORK) LOCK FLUSH 100 UNIT/ML IV SOLN
500.0000 [IU] | Freq: Once | INTRAVENOUS | Status: AC | PRN
  Administered 2020-08-19: 500 [IU]
  Filled 2020-08-19: qty 5

## 2020-08-19 MED ORDER — SODIUM CHLORIDE 0.9% FLUSH
10.0000 mL | INTRAVENOUS | Status: DC | PRN
Start: 2020-08-19 — End: 2020-08-19
  Administered 2020-08-19: 10 mL
  Filled 2020-08-19: qty 10

## 2020-08-27 ENCOUNTER — Other Ambulatory Visit: Payer: Self-pay | Admitting: Oncology

## 2020-08-31 ENCOUNTER — Inpatient Hospital Stay: Payer: Medicare Other

## 2020-08-31 ENCOUNTER — Encounter: Payer: Self-pay | Admitting: Nurse Practitioner

## 2020-08-31 ENCOUNTER — Inpatient Hospital Stay: Payer: Medicare Other | Attending: Oncology

## 2020-08-31 ENCOUNTER — Inpatient Hospital Stay (HOSPITAL_BASED_OUTPATIENT_CLINIC_OR_DEPARTMENT_OTHER): Payer: Medicare Other | Admitting: Nurse Practitioner

## 2020-08-31 ENCOUNTER — Other Ambulatory Visit: Payer: Self-pay

## 2020-08-31 VITALS — BP 145/78 | HR 84 | Temp 98.2°F | Resp 19 | Ht 63.0 in | Wt 213.8 lb

## 2020-08-31 DIAGNOSIS — D696 Thrombocytopenia, unspecified: Secondary | ICD-10-CM | POA: Insufficient documentation

## 2020-08-31 DIAGNOSIS — E039 Hypothyroidism, unspecified: Secondary | ICD-10-CM | POA: Diagnosis not present

## 2020-08-31 DIAGNOSIS — R059 Cough, unspecified: Secondary | ICD-10-CM | POA: Insufficient documentation

## 2020-08-31 DIAGNOSIS — C7800 Secondary malignant neoplasm of unspecified lung: Secondary | ICD-10-CM | POA: Diagnosis not present

## 2020-08-31 DIAGNOSIS — C787 Secondary malignant neoplasm of liver and intrahepatic bile duct: Secondary | ICD-10-CM | POA: Diagnosis present

## 2020-08-31 DIAGNOSIS — R0609 Other forms of dyspnea: Secondary | ICD-10-CM | POA: Insufficient documentation

## 2020-08-31 DIAGNOSIS — Z79899 Other long term (current) drug therapy: Secondary | ICD-10-CM | POA: Insufficient documentation

## 2020-08-31 DIAGNOSIS — C20 Malignant neoplasm of rectum: Secondary | ICD-10-CM | POA: Diagnosis not present

## 2020-08-31 DIAGNOSIS — Z923 Personal history of irradiation: Secondary | ICD-10-CM | POA: Diagnosis not present

## 2020-08-31 DIAGNOSIS — Z5111 Encounter for antineoplastic chemotherapy: Secondary | ICD-10-CM | POA: Insufficient documentation

## 2020-08-31 LAB — CMP (CANCER CENTER ONLY)
ALT: 38 U/L (ref 0–44)
AST: 40 U/L (ref 15–41)
Albumin: 3.5 g/dL (ref 3.5–5.0)
Alkaline Phosphatase: 86 U/L (ref 38–126)
Anion gap: 9 (ref 5–15)
BUN: 16 mg/dL (ref 8–23)
CO2: 26 mmol/L (ref 22–32)
Calcium: 8.6 mg/dL — ABNORMAL LOW (ref 8.9–10.3)
Chloride: 107 mmol/L (ref 98–111)
Creatinine: 0.78 mg/dL (ref 0.44–1.00)
GFR, Estimated: >60 mL/min (ref >60)
Glucose, Bld: 91 mg/dL (ref 70–99)
Potassium: 3.8 mmol/L (ref 3.5–5.1)
Sodium: 142 mmol/L (ref 135–145)
Total Bilirubin: 1.3 mg/dL — ABNORMAL HIGH (ref 0.3–1.2)
Total Protein: 6.2 g/dL — ABNORMAL LOW (ref 6.5–8.1)

## 2020-08-31 LAB — CBC WITH DIFFERENTIAL (CANCER CENTER ONLY)
Abs Immature Granulocytes: 0.04 10*3/uL (ref 0.00–0.07)
Basophils Absolute: 0 10*3/uL (ref 0.0–0.1)
Basophils Relative: 1 %
Eosinophils Absolute: 0.2 10*3/uL (ref 0.0–0.5)
Eosinophils Relative: 3 %
HCT: 35.9 % — ABNORMAL LOW (ref 36.0–46.0)
Hemoglobin: 12.3 g/dL (ref 12.0–15.0)
Immature Granulocytes: 1 %
Lymphocytes Relative: 15 %
Lymphs Abs: 0.8 10*3/uL (ref 0.7–4.0)
MCH: 33.2 pg (ref 26.0–34.0)
MCHC: 34.3 g/dL (ref 30.0–36.0)
MCV: 97 fL (ref 80.0–100.0)
Monocytes Absolute: 0.7 10*3/uL (ref 0.1–1.0)
Monocytes Relative: 12 %
Neutro Abs: 3.8 10*3/uL (ref 1.7–7.7)
Neutrophils Relative %: 68 %
Platelet Count: 97 10*3/uL — ABNORMAL LOW (ref 150–400)
RBC: 3.7 MIL/uL — ABNORMAL LOW (ref 3.87–5.11)
RDW: 13.6 % (ref 11.5–15.5)
WBC Count: 5.5 10*3/uL (ref 4.0–10.5)
nRBC: 0 % (ref 0.0–0.2)

## 2020-08-31 MED ORDER — SODIUM CHLORIDE 0.9 % IV SOLN
1400.0000 mg/m2 | INTRAVENOUS | Status: DC
  Administered 2020-08-31: 2850 mg via INTRAVENOUS
  Filled 2020-08-31: qty 57

## 2020-08-31 NOTE — Patient Instructions (Signed)
Samantha Lutz   Discharge Instructions: Thank you for choosing [facility] to provide your oncology and hematology care.   If you have a lab appointment with the [facility], please go directly to the [facility] and check in at the registration area.   Wear comfortable clothing and clothing appropriate for easy access to any Portacath or PICC line.   We strive to give you quality time with your provider. You may need to reschedule your appointment if you arrive late (15 or more minutes).  Arriving late affects you and other patients whose appointments are after yours.  Also, if you miss three or more appointments without notifying the office, you may be dismissed from the clinic at the provider's discretion.      For prescription refill requests, have your pharmacy contact our office and allow 72 hours for refills to be completed.    Today you received the following chemotherapy and/or immunotherapy agents Flourouracil (ADRUCIL).      To help prevent nausea and vomiting after your treatment, we encourage you to take your nausea medication as directed.  BELOW ARE SYMPTOMS THAT SHOULD BE REPORTED IMMEDIATELY: *FEVER GREATER THAN 100.4 F (38 C) OR HIGHER *CHILLS OR SWEATING *NAUSEA AND VOMITING THAT IS NOT CONTROLLED WITH YOUR NAUSEA MEDICATION *UNUSUAL SHORTNESS OF BREATH *UNUSUAL BRUISING OR BLEEDING *URINARY PROBLEMS (pain or burning when urinating, or frequent urination) *BOWEL PROBLEMS (unusual diarrhea, constipation, pain near the anus) TENDERNESS IN MOUTH AND THROAT WITH OR WITHOUT PRESENCE OF ULCERS (sore throat, sores in mouth, or a toothache) UNUSUAL RASH, SWELLING OR PAIN  UNUSUAL VAGINAL DISCHARGE OR ITCHING   Items with * indicate a potential emergency and should be followed up as soon as possible or go to the Emergency Department if any problems should occur.  Please show the CHEMOTHERAPY ALERT CARD or IMMUNOTHERAPY ALERT CARD at  check-in to the Emergency Department and triage nurse.  Should you have questions after your visit or need to cancel or reschedule your appointment, please contact [facility]  Dept: [phone]  and follow the prompts.  Office hours are 8:00 a.m. to 4:30 p.m. Monday - Friday. Please note that voicemails left after 4:00 p.m. may not be returned until the following business day.  We are closed weekends and major holidays. You have access to a nurse at all times for urgent questions. Please call the main number to the clinic Dept: [phone] and follow the prompts.   For any non-urgent questions, you may also contact your provider using MyChart. We now offer e-Visits for anyone 70 and older to request care online for non-urgent symptoms. For details visit [email or web address].   Also download the MyChart app! Go to the app store, search "MyChart", open the app, select [facility], and log in with your MyChart username and password.  Due to Covid, a mask is required upon entering the hospital/clinic. If you do not have a mask, one will be given to you upon arrival. For doctor visits, patients may have 1 support person aged 55 or older with them. For treatment visits, patients cannot have anyone with them due to current Covid guidelines and our immunocompromised population.   Fluorouracil, 5-FU injection What is this medication? FLUOROURACIL, 5-FU (flure oh YOOR a sil) is a chemotherapy drug. It slows the growth of cancer cells. This medicine is used to treat many types of cancer like breast cancer, colon or rectal cancer, pancreatic cancer, and stomachcancer. This  medicine may be used for other purposes; ask your health care provider orpharmacist if you have questions. COMMON BRAND NAME(S): Adrucil What should I tell my care team before I take this medication? They need to know if you have any of these conditions: blood disorders dihydropyrimidine dehydrogenase (DPD)  deficiency infection (especially a virus infection such as chickenpox, cold sores, or herpes) kidney disease liver disease malnourished, poor nutrition recent or ongoing radiation therapy an unusual or allergic reaction to fluorouracil, other chemotherapy, other medicines, foods, dyes, or preservatives pregnant or trying to get pregnant breast-feeding How should I use this medication? This drug is given as an infusion or injection into a vein. It is administeredin a hospital or clinic by a specially trained health care professional. Talk to your pediatrician regarding the use of this medicine in children.Special care may be needed. Overdosage: If you think you have taken too much of this medicine contact apoison control center or emergency room at once. NOTE: This medicine is only for you. Do not share this medicine with others. What if I miss a dose? It is important not to miss your dose. Call your doctor or health careprofessional if you are unable to keep an appointment. What may interact with this medication? Do not take this medicine with any of the following medications: live virus vaccines This medicine may also interact with the following medications: medicines that treat or prevent blood clots like warfarin, enoxaparin, and dalteparin This list may not describe all possible interactions. Give your health care provider a list of all the medicines, herbs, non-prescription drugs, or dietary supplements you use. Also tell them if you smoke, drink alcohol, or use illegaldrugs. Some items may interact with your medicine. What should I watch for while using this medication? Visit your doctor for checks on your progress. This drug may make you feel generally unwell. This is not uncommon, as chemotherapy can affect healthy cells as well as cancer cells. Report any side effects. Continue your course oftreatment even though you feel ill unless your doctor tells you to stop. In some cases, you  may be given additional medicines to help with side effects.Follow all directions for their use. Call your doctor or health care professional for advice if you get a fever, chills or sore throat, or other symptoms of a cold or flu. Do not treat yourself. This drug decreases your body's ability to fight infections. Try toavoid being around people who are sick. This medicine may increase your risk to bruise or bleed. Call your doctor orhealth care professional if you notice any unusual bleeding. Be careful brushing and flossing your teeth or using a toothpick because you may get an infection or bleed more easily. If you have any dental work done,tell your dentist you are receiving this medicine. Avoid taking products that contain aspirin, acetaminophen, ibuprofen, naproxen, or ketoprofen unless instructed by your doctor. These medicines may hide afever. Do not become pregnant while taking this medicine. Women should inform their doctor if they wish to become pregnant or think they might be pregnant. There is a potential for serious side effects to an unborn child. Talk to your health care professional or pharmacist for more information. Do not breast-feed aninfant while taking this medicine. Men should inform their doctor if they wish to father a child. This medicinemay lower sperm counts. Do not treat diarrhea with over the counter products. Contact your doctor ifyou have diarrhea that lasts more than 2 days or if it is severe and watery.  This medicine can make you more sensitive to the sun. Keep out of the sun. If you cannot avoid being in the sun, wear protective clothing and use sunscreen.Do not use sun lamps or tanning beds/booths. What side effects may I notice from receiving this medication? Side effects that you should report to your doctor or health care professionalas soon as possible: allergic reactions like skin rash, itching or hives, swelling of the face, lips, or tongue low blood counts -  this medicine may decrease the number of white blood cells, red blood cells and platelets. You may be at increased risk for infections and bleeding. signs of infection - fever or chills, cough, sore throat, pain or difficulty passing urine signs of decreased platelets or bleeding - bruising, pinpoint red spots on the skin, black, tarry stools, blood in the urine signs of decreased red blood cells - unusually weak or tired, fainting spells, lightheadedness breathing problems changes in vision chest pain mouth sores nausea and vomiting pain, swelling, redness at site where injected pain, tingling, numbness in the hands or feet redness, swelling, or sores on hands or feet stomach pain unusual bleeding Side effects that usually do not require medical attention (report to yourdoctor or health care professional if they continue or are bothersome): changes in finger or toe nails diarrhea dry or itchy skin hair loss headache loss of appetite sensitivity of eyes to the light stomach upset unusually teary eyes This list may not describe all possible side effects. Call your doctor for medical advice about side effects. You may report side effects to FDA at1-800-FDA-1088. Where should I keep my medication? This drug is given in a hospital or clinic and will not be stored at home. NOTE: This sheet is a summary. It may not cover all possible information. If you have questions about this medicine, talk to your doctor, pharmacist, orhealth care provider.  2022 Elsevier/Gold Standard (2018-12-17 15:00:03)

## 2020-08-31 NOTE — Progress Notes (Signed)
[facility] OFFICE PROGRESS NOTE   Diagnosis: Rectal cancer  INTERVAL HISTORY:   Ms. Kirchman returns as scheduled.  She completed another cycle of 5-FU 08/17/2020.  She denies nausea/vomiting.  No mouth sores.  No diarrhea.  No hand or foot pain or redness.  Stable mild dyspnea on exertion.  Occasional cough.  No fever.  Objective:  Vital signs in last 24 hours:  Blood pressure (!) 145/78, pulse 84, temperature 98.2 F (36.8 C), temperature source Oral, resp. rate 19, height 5' 3" (1.6 m), weight 213 lb 12.8 oz (97 kg), SpO2 97 %.    HEENT: No thrush or ulcers. Resp: Lungs clear bilaterally. Cardio: Regular rate and rhythm. GI: Abdomen soft and nontender.  No hepatomegaly. Vascular: No leg edema. Skin: Palms without erythema. Port-A-Cath without erythema.   Lab Results:  Lab Results  Component Value Date   WBC 5.5 08/31/2020   HGB 12.3 08/31/2020   HCT 35.9 (L) 08/31/2020   MCV 97.0 08/31/2020   PLT 97 (L) 08/31/2020   NEUTROABS 3.8 08/31/2020    Imaging:  No results found.  Medications: I have reviewed the patient's current medications.  Assessment/Plan: Rectal cancer Mass at 7 cm from the anal verge on colonoscopy 08/09/2017, biopsy revealed invasive adenocarcinoma Staging CTs 08/17/2017-no evidence of metastatic disease, asymmetric thickening in the mid rectum MR pelvis 09/01/2017, T3N0 lesion beginning at 6.3 cm from the anal sphincter Radiation/Xeloda initiated 09/17/2017, completed 10/25/2017 Low anterior resection/diverting ileostomy 12/21/2017,ypT3,ypN1a tumor.  Lymphovascular invasion present, intact mismatch repair protein expression, treatment effect present (TRS 1).  Mismatch repair protein IHC normal; Foundation 1-KRAS/NRAS wild-type, microsatellite status and tumor mutational burden could not be determined. Cycle 1 adjuvant Xeloda beginning 01/21/2018 Cycle 2 adjuvant Xeloda beginning 02/11/2018 Xeloda discontinued after cycle 2 secondary  to patient preference Ileostomy takedown 07/10/2018 CTs 09/07/2018- multiple live small pulmonary nodules concerning for metastatic disease CT chest 12/10/2018-multiple bilateral lung nodules, some have increased in size CT chest 04/08/2019-mild enlargement of bilateral lung nodules Status post SBRT multiple lung nodules 05/06/2019, 05/08/2019, 05/13/2019 CT chest 08/28/2019-improvement and resolution in majority of right-sided pulmonary nodules, a superior segment right lower lobe nodule has increased, no new right-sided nodules.  Progressive enlargement of left-sided pulmonary nodules PET scan 09/09/2019-hypermetabolic right lower lobe nodule, 2 hypermetabolic left upper lobe nodules with an additional 0.7 cm left upper lobe nodule below PET resolution, groundglass opacity in the mid to right lower lobe with associated hypermetabolism consistent with postradiation change, hypermetabolic central segment 4A liver lesion Cycle 1 FOLFOX 10/14/2019 Cycle  2FOLFOX 11/11/2019, 5-FU and oxaliplatin dose reduced secondary to neutropenia and thrombocytopenia following cycle one, G-CSF declined Cycle 3 FOLFOX 11/26/2019 Cycle 4 FOLFOX 12/11/2019, oxaliplatin held due to neutropenia and thrombocytopenia Cycle 5 FOLFOX 12/31/2019 Cycle 6 FOLFOX 01/14/2020 (oxaliplatin held, 5-fluorouracil dose reduced due to mucositis) CT chest 01/27/2020-improvement in left lung nodules, progressive airspace disease with traction bronchiectasis throughout the right lung, previously noted right lung nodules are obscured, mildly enlarged right paratracheal lymph nodes-not hypermetabolic on prior PET, likely reactive Cycle 7 FOLFOX 01/28/2020 Cycle 8 FOLFOX 02/11/2020 (oxaliplatin held due to neutropenia and thrombocytopenia) Cycle 9 FOLFOX 02/25/2020 (oxaliplatin held due to thrombocytopenia) Cycle 10 FOLFOX 03/11/2020 (oxaliplatin held secondary to neuropathy) Cycle 11 FOLFOX 03/25/2020 (oxaliplatin held due to neuropathy) CT chest  04/05/2020-no new or progressive findings.  Interval evolution of presumed postradiation scarring in the right perihilar lung with decrease in the more diffuse groundglass opacity seen previously.  No substantial change in left lung nodules.  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  No definite CT findings to suggest recurrent tumor.  Stable small left upper lobe pulmonary nodules.  No new or progressive findings.  Stable small right paratracheal lymph nodes.  Stable area of irregular low-attenuation in hepatic segment 4A, site of known prior hepatic metastatic lesion.  No new or progressive findings. Cycle 18 5-fluorouracil 07/06/2020 Cycle 19 5-fluorouracil 07/20/2020 Cycle 20 5-fluorouracil 08/03/2020 Cycle 21 5-fluorouracil 08/17/2020  Cycle 22 5-fluorouracil 08/31/2020   2.   Hypothyroid   3.    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  Port-A-Cath placement 09/29/2019, interventional radiology   5.  Neutropenia and thrombocytopenia following cycle 1 FOLFOX-plan chemotherapy dose reductions, she declined G-CSF   6.  Mucositis following cycle 5 FOLFOX, 5-fluorouracil dose reduced with cycle 6   7.  Right lung airspace disease/volume loss-likely toxicity from chest radiation, trial of prednisone 01/29/2020; dyspnea and cough improved 02/11/2020 Progressive cough 03/11/2020-prednisone resumed at a dose of 20 mg daily Cough and dyspnea improved 03/25/2020-prednisone taper to 15 mg daily Improved 04/06/2020-prednisone taper to 10 mg daily Stable 04/21/2020-prednisone taper to 5 mg daily        Disposition: Ms. Hole appears well.  She is on active treatment with every  2-week 5-fluorouracil.  There is no clinical evidence of disease progression.  Plan to continue the same, cycle 22 today.  We reviewed the CBC and chemistry panel from today.  Labs adequate to proceed as above.  She will return for lab, follow-up, 5-fluorouracil in 2 weeks.  Restaging chest CT in approximately 6 weeks.    Ned Card ANP/GNP-BC   08/31/2020  11:45 AM

## 2020-09-02 ENCOUNTER — Inpatient Hospital Stay: Payer: Medicare Other

## 2020-09-02 ENCOUNTER — Other Ambulatory Visit: Payer: Self-pay

## 2020-09-02 VITALS — BP 127/79 | HR 90 | Temp 98.2°F | Resp 18

## 2020-09-02 DIAGNOSIS — C20 Malignant neoplasm of rectum: Secondary | ICD-10-CM

## 2020-09-02 DIAGNOSIS — Z5111 Encounter for antineoplastic chemotherapy: Secondary | ICD-10-CM | POA: Diagnosis not present

## 2020-09-02 MED ORDER — HEPARIN SOD (PORK) LOCK FLUSH 100 UNIT/ML IV SOLN
500.0000 [IU] | Freq: Once | INTRAVENOUS | Status: AC | PRN
  Administered 2020-09-02: 500 [IU]
  Filled 2020-09-02: qty 5

## 2020-09-02 MED ORDER — SODIUM CHLORIDE 0.9% FLUSH
10.0000 mL | INTRAVENOUS | Status: DC | PRN
  Administered 2020-09-02: 10 mL
  Filled 2020-09-02: qty 10

## 2020-09-14 ENCOUNTER — Inpatient Hospital Stay (HOSPITAL_BASED_OUTPATIENT_CLINIC_OR_DEPARTMENT_OTHER): Payer: Medicare Other | Admitting: Oncology

## 2020-09-14 ENCOUNTER — Inpatient Hospital Stay: Payer: Medicare Other

## 2020-09-14 ENCOUNTER — Other Ambulatory Visit: Payer: Self-pay

## 2020-09-14 VITALS — BP 135/80 | HR 90 | Temp 98.1°F | Resp 20 | Ht 63.0 in | Wt 212.0 lb

## 2020-09-14 DIAGNOSIS — C20 Malignant neoplasm of rectum: Secondary | ICD-10-CM

## 2020-09-14 DIAGNOSIS — Z5111 Encounter for antineoplastic chemotherapy: Secondary | ICD-10-CM | POA: Diagnosis not present

## 2020-09-14 LAB — CMP (CANCER CENTER ONLY)
ALT: 33 U/L (ref 0–44)
AST: 40 U/L (ref 15–41)
Albumin: 3.3 g/dL — ABNORMAL LOW (ref 3.5–5.0)
Alkaline Phosphatase: 101 U/L (ref 38–126)
Anion gap: 9 (ref 5–15)
BUN: 13 mg/dL (ref 8–23)
CO2: 24 mmol/L (ref 22–32)
Calcium: 8.9 mg/dL (ref 8.9–10.3)
Chloride: 107 mmol/L (ref 98–111)
Creatinine: 0.77 mg/dL (ref 0.44–1.00)
GFR, Estimated: >60 mL/min (ref >60)
Glucose, Bld: 98 mg/dL (ref 70–99)
Potassium: 3.9 mmol/L (ref 3.5–5.1)
Sodium: 140 mmol/L (ref 135–145)
Total Bilirubin: 1.5 mg/dL — ABNORMAL HIGH (ref 0.3–1.2)
Total Protein: 6.3 g/dL — ABNORMAL LOW (ref 6.5–8.1)

## 2020-09-14 LAB — CBC WITH DIFFERENTIAL (CANCER CENTER ONLY)
Abs Immature Granulocytes: 0.02 10*3/uL (ref 0.00–0.07)
Basophils Absolute: 0 10*3/uL (ref 0.0–0.1)
Basophils Relative: 1 %
Eosinophils Absolute: 0.3 10*3/uL (ref 0.0–0.5)
Eosinophils Relative: 6 %
HCT: 36.7 % (ref 36.0–46.0)
Hemoglobin: 12.3 g/dL (ref 12.0–15.0)
Immature Granulocytes: 0 %
Lymphocytes Relative: 13 %
Lymphs Abs: 0.7 10*3/uL (ref 0.7–4.0)
MCH: 32.9 pg (ref 26.0–34.0)
MCHC: 33.5 g/dL (ref 30.0–36.0)
MCV: 98.1 fL (ref 80.0–100.0)
Monocytes Absolute: 0.9 10*3/uL (ref 0.1–1.0)
Monocytes Relative: 16 %
Neutro Abs: 3.7 10*3/uL (ref 1.7–7.7)
Neutrophils Relative %: 64 %
Platelet Count: 109 10*3/uL — ABNORMAL LOW (ref 150–400)
RBC: 3.74 MIL/uL — ABNORMAL LOW (ref 3.87–5.11)
RDW: 14.2 % (ref 11.5–15.5)
WBC Count: 5.7 10*3/uL (ref 4.0–10.5)
nRBC: 0 % (ref 0.0–0.2)

## 2020-09-14 MED ORDER — MAGIC MOUTHWASH: 5.0000 mL | Freq: Four times a day (QID) | ORAL | 0 refills | Status: DC | PRN

## 2020-09-14 MED ORDER — SODIUM CHLORIDE 0.9 % IV SOLN
1400.0000 mg/m2 | INTRAVENOUS | Status: DC
  Administered 2020-09-14: 2850 mg via INTRAVENOUS
  Filled 2020-09-14: qty 57

## 2020-09-14 NOTE — Progress Notes (Signed)
[facility] OFFICE PROGRESS NOTE   Diagnosis: Rectal cancer  INTERVAL HISTORY:   Samantha Lutz complete another cycle of 5-fluorouracil on 08/31/2020.  No diarrhea.  She developed mouth sores under the anterior aspect of the tongue.  These have resolved.  She has stable exertional dyspnea.  Objective:  Vital signs in last 24 hours:  Blood pressure 135/80, pulse 90, temperature 98.1 F (36.7 C), temperature source Oral, resp. rate 20, height '5\' 3"'  (1.6 m), weight 212 lb (96.2 kg), SpO2 96 %.    HEENT: No thrush or ulcers Resp: Lungs with decreased breath sounds at the right lower posterior chest, no respiratory distress Cardio: Regular rate and rhythm GI: No hepatosplenomegaly Vascular: No leg edema  Skin: Palms without erythema  Portacath/PICC-without erythema  Lab Results:  Lab Results  Component Value Date   WBC 5.7 09/14/2020   HGB 12.3 09/14/2020   HCT 36.7 09/14/2020   MCV 98.1 09/14/2020   PLT 109 (L) 09/14/2020   NEUTROABS 3.7 09/14/2020    CMP  Lab Results  Component Value Date   NA 140 09/14/2020   K 3.9 09/14/2020   CL 107 09/14/2020   CO2 24 09/14/2020   GLUCOSE 98 09/14/2020   BUN 13 09/14/2020   CREATININE 0.77 09/14/2020   CALCIUM 8.9 09/14/2020   PROT 6.3 (L) 09/14/2020   ALBUMIN 3.3 (L) 09/14/2020   AST 40 09/14/2020   ALT 33 09/14/2020   ALKPHOS 101 09/14/2020   BILITOT 1.5 (H) 09/14/2020   GFRNONAA >60 09/14/2020   GFRAA >60 10/28/2019    Lab Results  Component Value Date   CEA1 <1.00 06/01/2020   CEA <1.00 06/01/2020   Medications: I have reviewed the patient's current medications.   Assessment/Plan: Rectal cancer Mass at 7 cm from the anal verge on colonoscopy 08/09/2017, biopsy revealed invasive adenocarcinoma Staging CTs 08/17/2017-no evidence of metastatic disease, asymmetric thickening in the mid rectum MR pelvis 09/01/2017, T3N0 lesion beginning at 6.3 cm from the anal sphincter Radiation/Xeloda initiated  09/17/2017, completed 10/25/2017 Low anterior resection/diverting ileostomy 12/21/2017,ypT3,ypN1a tumor.  Lymphovascular invasion present, intact mismatch repair protein expression, treatment effect present (TRS 1).  Mismatch repair protein IHC normal; Foundation 1-KRAS/NRAS wild-type, microsatellite status and tumor mutational burden could not be determined. Cycle 1 adjuvant Xeloda beginning 01/21/2018 Cycle 2 adjuvant Xeloda beginning 02/11/2018 Xeloda discontinued after cycle 2 secondary to patient preference Ileostomy takedown 07/10/2018 CTs 09/07/2018- multiple live small pulmonary nodules concerning for metastatic disease CT chest 12/10/2018-multiple bilateral lung nodules, some have increased in size CT chest 04/08/2019-mild enlargement of bilateral lung nodules Status post SBRT multiple lung nodules 05/06/2019, 05/08/2019, 05/13/2019 CT chest 08/28/2019-improvement and resolution in majority of right-sided pulmonary nodules, a superior segment right lower lobe nodule has increased, no new right-sided nodules.  Progressive enlargement of left-sided pulmonary nodules PET scan 09/09/2019-hypermetabolic right lower lobe nodule, 2 hypermetabolic left upper lobe nodules with an additional 0.7 cm left upper lobe nodule below PET resolution, groundglass opacity in the mid to right lower lobe with associated hypermetabolism consistent with postradiation change, hypermetabolic central segment 4A liver lesion Cycle 1 FOLFOX 10/14/2019 Cycle  2FOLFOX 11/11/2019, 5-FU and oxaliplatin dose reduced secondary to neutropenia and thrombocytopenia following cycle one, G-CSF declined Cycle 3 FOLFOX 11/26/2019 Cycle 4 FOLFOX 12/11/2019, oxaliplatin held due to neutropenia and thrombocytopenia Cycle 5 FOLFOX 12/31/2019 Cycle 6 FOLFOX 01/14/2020 (oxaliplatin held, 5-fluorouracil dose reduced due to mucositis) CT chest 01/27/2020-improvement in left lung nodules, progressive airspace disease with traction bronchiectasis throughout  the right  lung, previously noted right lung nodules are obscured, mildly enlarged right paratracheal lymph nodes-not hypermetabolic on prior PET, likely reactive Cycle 7 FOLFOX 01/28/2020 Cycle 8 FOLFOX 02/11/2020 (oxaliplatin held due to neutropenia and thrombocytopenia) Cycle 9 FOLFOX 02/25/2020 (oxaliplatin held due to thrombocytopenia) Cycle 10 FOLFOX 03/11/2020 (oxaliplatin held secondary to neuropathy) Cycle 11 FOLFOX 03/25/2020 (oxaliplatin held due to neuropathy) CT chest 04/05/2020-no new or progressive findings.  Interval evolution of presumed postradiation scarring in the right perihilar lung with decrease in the more diffuse groundglass opacity seen previously.  No substantial change in left lung nodules. 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  No definite CT findings to suggest recurrent tumor.  Stable small left upper lobe pulmonary nodules.  No new or progressive findings.  Stable small right paratracheal lymph nodes.  Stable area of irregular low-attenuation in hepatic segment 4A, site of known prior hepatic metastatic lesion.  No new or progressive findings. Cycle 18 5-fluorouracil 07/06/2020 Cycle 19 5-fluorouracil 07/20/2020 Cycle 20 5-fluorouracil 08/03/2020 Cycle 21 5-fluorouracil 08/17/2020  Cycle 22 5-fluorouracil 08/31/2020 Cycle 23 5-fluorouracil 09/14/2020   2.   Hypothyroid   3.    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  Port-A-Cath placement 09/29/2019, interventional radiology   5.  Neutropenia and thrombocytopenia following cycle 1 FOLFOX-plan chemotherapy dose reductions, she declined G-CSF   6.  Mucositis following cycle 5 FOLFOX,  5-fluorouracil dose reduced with cycle 6 Mucositis following cycle 22 5-fluorouracil-Magic mouthwash added   7.  Right lung airspace disease/volume loss-likely toxicity from chest radiation, trial of prednisone 01/29/2020; dyspnea and cough improved 02/11/2020 Progressive cough 03/11/2020-prednisone resumed at a dose of 20 mg daily Cough and dyspnea improved 03/25/2020-prednisone taper to 15 mg daily Improved 04/06/2020-prednisone taper to 10 mg daily Stable 04/21/2020-prednisone taper to 5 mg daily          Disposition: Samantha Lutz appears stable.  She complete another cycle of 5-fluorouracil today.  She will use Magic mouthwash as needed for recurrent mouth sores.  She will undergo a restaging CT evaluation prior to an office visit in 2 weeks.  Betsy Coder, MD  09/14/2020  11:33 AM

## 2020-09-14 NOTE — Patient Instructions (Signed)
[facility]  Discharge Instructions: Thank you for choosing [facility] to provide your oncology and hematology care.   If you have a lab appointment with the [facility], please go directly to the [facility] and check in at the registration area.   Wear comfortable clothing and clothing appropriate for easy access to any Portacath or PICC line.   We strive to give you quality time with your provider. You may need to reschedule your appointment if you arrive late (15 or more minutes).  Arriving late affects you and other patients whose appointments are after yours.  Also, if you miss three or more appointments without notifying the office, you may be dismissed from the clinic at the provider's discretion.      For prescription refill requests, have your pharmacy contact our office and allow 72 hours for refills to be completed.    Today you received the following chemotherapy and/or immunotherapy agents 5FU      To help prevent nausea and vomiting after your treatment, we encourage you to take your nausea medication as directed.  BELOW ARE SYMPTOMS THAT SHOULD BE REPORTED IMMEDIATELY: *FEVER GREATER THAN 100.4 F (38 C) OR HIGHER *CHILLS OR SWEATING *NAUSEA AND VOMITING THAT IS NOT CONTROLLED WITH YOUR NAUSEA MEDICATION *UNUSUAL SHORTNESS OF BREATH *UNUSUAL BRUISING OR BLEEDING *URINARY PROBLEMS (pain or burning when urinating, or frequent urination) *BOWEL PROBLEMS (unusual diarrhea, constipation, pain near the anus) TENDERNESS IN MOUTH AND THROAT WITH OR WITHOUT PRESENCE OF ULCERS (sore throat, sores in mouth, or a toothache) UNUSUAL RASH, SWELLING OR PAIN  UNUSUAL VAGINAL DISCHARGE OR ITCHING   Items with * indicate a potential emergency and should be followed up as soon as possible or go to the Emergency Department if any problems should occur.  Please show the CHEMOTHERAPY ALERT CARD or IMMUNOTHERAPY ALERT CARD at check-in to the  Emergency Department and triage nurse.  Should you have questions after your visit or need to cancel or reschedule your appointment, please contact [facility]  Dept: [phone]  and follow the prompts.  Office hours are 8:00 a.m. to 4:30 p.m. Monday - Friday. Please note that voicemails left after 4:00 p.m. may not be returned until the following business day.  We are closed weekends and major holidays. You have access to a nurse at all times for urgent questions. Please call the main number to the clinic Dept: [phone] and follow the prompts.   For any non-urgent questions, you may also contact your provider using MyChart. We now offer e-Visits for anyone 22 and older to request care online for non-urgent symptoms. For details visit [email or web address].   Also download the MyChart app! Go to the app store, search "MyChart", open the app, select [facility], and log in with your MyChart username and password.  Due to Covid, a mask is required upon entering the hospital/clinic. If you do not have a mask, one will be given to you upon arrival. For doctor visits, patients may have 1 support person aged 21 or older with them. For treatment visits, patients cannot have anyone with them due to current Covid guidelines and our immunocompromised population.

## 2020-09-14 NOTE — Patient Instructions (Signed)
Implanted Port Home Guide An implanted port is a device that is placed under the skin. It is usually placed in the chest. The device can be used to give IV medicine, to take blood, or for dialysis. You may have an implanted port if: You need IV medicine that would be irritating to the small veins in your hands or arms. You need IV medicines, such as antibiotics, for a long period of time. You need IV nutrition for a long period of time. You need dialysis. When you have a port, your health care provider can choose to use the port instead of veins in your arms for these procedures. You may have fewer limitations when using a port than you would if you used other types of long-term IVs, and you will likely be able to return to normal activities afteryour incision heals. An implanted port has two main parts: Reservoir. The reservoir is the part where a needle is inserted to give medicines or draw blood. The reservoir is round. After it is placed, it appears as a small, raised area under your skin. Catheter. The catheter is a thin, flexible tube that connects the reservoir to a vein. Medicine that is inserted into the reservoir goes into the catheter and then into the vein. How is my port accessed? To access your port: A numbing cream may be placed on the skin over the port site. Your health care provider will put on a mask and sterile gloves. The skin over your port will be cleaned carefully with a germ-killing soap and allowed to dry. Your health care provider will gently pinch the port and insert a needle into it. Your health care provider will check for a blood return to make sure the port is in the vein and is not clogged. If your port needs to remain accessed to get medicine continuously (constant infusion), your health care provider will place a clear bandage (dressing) over the needle site. The dressing and needle will need to be changed every week, or as told by your health care provider. What  is flushing? Flushing helps keep the port from getting clogged. Follow instructions from your health care provider about how and when to flush the port. Ports are usually flushed with saline solution or a medicine called heparin. The need for flushing will depend on how the port is used: If the port is only used from time to time to give medicines or draw blood, the port may need to be flushed: Before and after medicines have been given. Before and after blood has been drawn. As part of routine maintenance. Flushing may be recommended every 4-6 weeks. If a constant infusion is running, the port may not need to be flushed. Throw away any syringes in a disposal container that is meant for sharp items (sharps container). You can buy a sharps container from a pharmacy, or you can make one by using an empty hard plastic bottle with a cover. How long will my port stay implanted? The port can stay in for as long as your health care provider thinks it is needed. When it is time for the port to come out, a surgery will be done to remove it. The surgery will be similar to the procedure that was done to putthe port in. Follow these instructions at home:  Flush your port as told by your health care provider. If you need an infusion over several days, follow instructions from your health care provider about how to take   care of your port site. Make sure you: Wash your hands with soap and water before you change your dressing. If soap and water are not available, use alcohol-based hand sanitizer. Change your dressing as told by your health care provider. Place any used dressings or infusion bags into a plastic bag. Throw that bag in the trash. Keep the dressing that covers the needle clean and dry. Do not get it wet. Do not use scissors or sharp objects near the tube. Keep the tube clamped, unless it is being used. Check your port site every day for signs of infection. Check for: Redness, swelling, or  pain. Fluid or blood. Pus or a bad smell. Protect the skin around the port site. Avoid wearing bra straps that rub or irritate the site. Protect the skin around your port from seat belts. Place a soft pad over your chest if needed. Bathe or shower as told by your health care provider. The site may get wet as long as you are not actively receiving an infusion. Return to your normal activities as told by your health care provider. Ask your health care provider what activities are safe for you. Carry a medical alert card or wear a medical alert bracelet at all times. This will let health care providers know that you have an implanted port in case of an emergency. Get help right away if: You have redness, swelling, or pain at the port site. You have fluid or blood coming from your port site. You have pus or a bad smell coming from the port site. You have a fever. Summary Implanted ports are usually placed in the chest for long-term IV access. Follow instructions from your health care provider about flushing the port and changing bandages (dressings). Take care of the area around your port by avoiding clothing that puts pressure on the area, and by watching for signs of infection. Protect the skin around your port from seat belts. Place a soft pad over your chest if needed. Get help right away if you have a fever or you have redness, swelling, pain, drainage, or a bad smell at the port site. This information is not intended to replace advice given to you by your health care provider. Make sure you discuss any questions you have with your healthcare provider. Document Revised: 06/02/2019 Document Reviewed: 06/02/2019 Elsevier Patient Education  2022 [employer]  

## 2020-09-14 NOTE — Addendum Note (Signed)
Addended by: Betsy Coder B on: 09/14/2020 12:01 PM   Modules accepted: Orders

## 2020-09-16 ENCOUNTER — Inpatient Hospital Stay: Payer: Medicare Other

## 2020-09-16 ENCOUNTER — Other Ambulatory Visit: Payer: Self-pay

## 2020-09-16 VITALS — BP 147/63 | HR 88 | Temp 98.6°F | Resp 20

## 2020-09-16 DIAGNOSIS — C20 Malignant neoplasm of rectum: Secondary | ICD-10-CM

## 2020-09-16 DIAGNOSIS — Z5111 Encounter for antineoplastic chemotherapy: Secondary | ICD-10-CM | POA: Diagnosis not present

## 2020-09-16 MED ORDER — SODIUM CHLORIDE 0.9% FLUSH
10.0000 mL | INTRAVENOUS | Status: DC | PRN
  Administered 2020-09-16: 10 mL

## 2020-09-16 MED ORDER — HEPARIN SOD (PORK) LOCK FLUSH 100 UNIT/ML IV SOLN
500.0000 [IU] | Freq: Once | INTRAVENOUS | Status: AC | PRN
  Administered 2020-09-16: 500 [IU]

## 2020-09-24 ENCOUNTER — Encounter (HOSPITAL_BASED_OUTPATIENT_CLINIC_OR_DEPARTMENT_OTHER): Payer: Self-pay

## 2020-09-24 ENCOUNTER — Ambulatory Visit (HOSPITAL_BASED_OUTPATIENT_CLINIC_OR_DEPARTMENT_OTHER)
Admission: RE | Admit: 2020-09-24 | Discharge: 2020-09-24 | Disposition: A | Discharge status: Home / Self Care | Payer: Medicare Other | Source: Ambulatory Visit | Attending: Oncology | Admitting: Oncology

## 2020-09-24 ENCOUNTER — Inpatient Hospital Stay: Payer: Medicare Other

## 2020-09-24 ENCOUNTER — Other Ambulatory Visit: Payer: Self-pay

## 2020-09-24 VITALS — BP 159/87 | HR 91 | Temp 98.7°F | Resp 20

## 2020-09-24 DIAGNOSIS — Z5111 Encounter for antineoplastic chemotherapy: Secondary | ICD-10-CM | POA: Diagnosis not present

## 2020-09-24 DIAGNOSIS — C20 Malignant neoplasm of rectum: Secondary | ICD-10-CM | POA: Insufficient documentation

## 2020-09-24 DIAGNOSIS — Z95828 Presence of other vascular implants and grafts: Secondary | ICD-10-CM

## 2020-09-24 MED ORDER — IOHEXOL 350 MG/ML SOLN
60.0000 mL | Freq: Once | INTRAVENOUS | Status: AC | PRN
  Administered 2020-09-24: 60 mL via INTRAVENOUS

## 2020-09-24 MED ORDER — SODIUM CHLORIDE 0.9% FLUSH
10.0000 mL | Freq: Once | INTRAVENOUS | Status: AC
  Administered 2020-09-24: 10 mL via INTRAVENOUS

## 2020-09-24 MED ORDER — HEPARIN SOD (PORK) LOCK FLUSH 100 UNIT/ML IV SOLN
500.0000 [IU] | Freq: Once | INTRAVENOUS | Status: AC
  Administered 2020-09-24: 500 [IU] via INTRAVENOUS

## 2020-09-24 NOTE — Addendum Note (Signed)
Addended by: Georgianne Fick on: 09/24/2020 03:07 PM   Modules accepted: Orders

## 2020-09-26 ENCOUNTER — Other Ambulatory Visit: Payer: Self-pay | Admitting: Oncology

## 2020-09-28 ENCOUNTER — Encounter: Payer: Self-pay | Admitting: Nurse Practitioner

## 2020-09-28 ENCOUNTER — Inpatient Hospital Stay: Payer: Medicare Other

## 2020-09-28 ENCOUNTER — Other Ambulatory Visit: Payer: Self-pay

## 2020-09-28 ENCOUNTER — Inpatient Hospital Stay (HOSPITAL_BASED_OUTPATIENT_CLINIC_OR_DEPARTMENT_OTHER): Payer: Medicare Other | Admitting: Nurse Practitioner

## 2020-09-28 VITALS — BP 158/65 | HR 80 | Temp 98.2°F | Resp 18 | Ht 63.0 in | Wt 211.2 lb

## 2020-09-28 DIAGNOSIS — C20 Malignant neoplasm of rectum: Secondary | ICD-10-CM | POA: Diagnosis not present

## 2020-09-28 DIAGNOSIS — Z5111 Encounter for antineoplastic chemotherapy: Secondary | ICD-10-CM | POA: Diagnosis not present

## 2020-09-28 LAB — CMP (CANCER CENTER ONLY)
ALT: 37 U/L (ref 0–44)
AST: 50 U/L — ABNORMAL HIGH (ref 15–41)
Albumin: 3.3 g/dL — ABNORMAL LOW (ref 3.5–5.0)
Alkaline Phosphatase: 102 U/L (ref 38–126)
Anion gap: 8 (ref 5–15)
BUN: 19 mg/dL (ref 8–23)
CO2: 25 mmol/L (ref 22–32)
Calcium: 8.8 mg/dL — ABNORMAL LOW (ref 8.9–10.3)
Chloride: 106 mmol/L (ref 98–111)
Creatinine: 0.79 mg/dL (ref 0.44–1.00)
GFR, Estimated: >60 mL/min (ref >60)
Glucose, Bld: 96 mg/dL (ref 70–99)
Potassium: 3.6 mmol/L (ref 3.5–5.1)
Sodium: 139 mmol/L (ref 135–145)
Total Bilirubin: 1.1 mg/dL (ref 0.3–1.2)
Total Protein: 6.5 g/dL (ref 6.5–8.1)

## 2020-09-28 LAB — CBC WITH DIFFERENTIAL (CANCER CENTER ONLY)
Abs Immature Granulocytes: 0.02 10*3/uL (ref 0.00–0.07)
Basophils Absolute: 0 10*3/uL (ref 0.0–0.1)
Basophils Relative: 1 %
Eosinophils Absolute: 0.2 10*3/uL (ref 0.0–0.5)
Eosinophils Relative: 3 %
HCT: 36.2 % (ref 36.0–46.0)
Hemoglobin: 12.3 g/dL (ref 12.0–15.0)
Immature Granulocytes: 0 %
Lymphocytes Relative: 11 %
Lymphs Abs: 0.7 10*3/uL (ref 0.7–4.0)
MCH: 33.2 pg (ref 26.0–34.0)
MCHC: 34 g/dL (ref 30.0–36.0)
MCV: 97.6 fL (ref 80.0–100.0)
Monocytes Absolute: 1 10*3/uL (ref 0.1–1.0)
Monocytes Relative: 15 %
Neutro Abs: 4.6 10*3/uL (ref 1.7–7.7)
Neutrophils Relative %: 70 %
Platelet Count: 120 10*3/uL — ABNORMAL LOW (ref 150–400)
RBC: 3.71 MIL/uL — ABNORMAL LOW (ref 3.87–5.11)
RDW: 14.3 % (ref 11.5–15.5)
WBC Count: 6.5 10*3/uL (ref 4.0–10.5)
nRBC: 0 % (ref 0.0–0.2)

## 2020-09-28 NOTE — Progress Notes (Signed)
[facility] OFFICE PROGRESS NOTE   Diagnosis: Rectal cancer  INTERVAL HISTORY:   Samantha Lutz returns as scheduled.  She completed another cycle of 5-fluorouracil 09/14/2020.  She denies nausea/vomiting.  No mouth sores.  No diarrhea.  No hand or foot pain or redness.  She notes cough has increased and she has increased dyspnea on exertion.  Objective:  Vital signs in last 24 hours:  Blood pressure (!) 158/65, pulse 80, temperature 98.2 F (36.8 C), temperature source Oral, resp. rate 18, height '5\' 3"'  (1.6 m), weight 211 lb 3.2 oz (95.8 kg), SpO2 93 %.    HEENT: No thrush or ulcers. Resp: Decreased breath sounds right lower posterior chest.  No respiratory distress. Cardio: Regular rate and rhythm. GI: No hepatosplenomegaly. Vascular: No leg edema. Skin: Palms without erythema. Port-A-Cath without erythema.   Lab Results:  Lab Results  Component Value Date   WBC 6.5 09/28/2020   HGB 12.3 09/28/2020   HCT 36.2 09/28/2020   MCV 97.6 09/28/2020   PLT 120 (L) 09/28/2020   NEUTROABS 4.6 09/28/2020    Imaging:  No results found.  Medications: I have reviewed the patient's current medications.  Assessment/Plan: Rectal cancer Mass at 7 cm from the anal verge on colonoscopy 08/09/2017, biopsy revealed invasive adenocarcinoma Staging CTs 08/17/2017-no evidence of metastatic disease, asymmetric thickening in the mid rectum MR pelvis 09/01/2017, T3N0 lesion beginning at 6.3 cm from the anal sphincter Radiation/Xeloda initiated 09/17/2017, completed 10/25/2017 Low anterior resection/diverting ileostomy 12/21/2017,ypT3,ypN1a tumor.  Lymphovascular invasion present, intact mismatch repair protein expression, treatment effect present (TRS 1).  Mismatch repair protein IHC normal; Foundation 1-KRAS/NRAS wild-type, microsatellite status and tumor mutational burden could not be determined. Cycle 1 adjuvant Xeloda beginning 01/21/2018 Cycle 2 adjuvant Xeloda beginning  02/11/2018 Xeloda discontinued after cycle 2 secondary to patient preference Ileostomy takedown 07/10/2018 CTs 09/07/2018- multiple live small pulmonary nodules concerning for metastatic disease CT chest 12/10/2018-multiple bilateral lung nodules, some have increased in size CT chest 04/08/2019-mild enlargement of bilateral lung nodules Status post SBRT multiple lung nodules 05/06/2019, 05/08/2019, 05/13/2019 CT chest 08/28/2019-improvement and resolution in majority of right-sided pulmonary nodules, a superior segment right lower lobe nodule has increased, no new right-sided nodules.  Progressive enlargement of left-sided pulmonary nodules PET scan 09/09/2019-hypermetabolic right lower lobe nodule, 2 hypermetabolic left upper lobe nodules with an additional 0.7 cm left upper lobe nodule below PET resolution, groundglass opacity in the mid to right lower lobe with associated hypermetabolism consistent with postradiation change, hypermetabolic central segment 4A liver lesion Cycle 1 FOLFOX 10/14/2019 Cycle  2FOLFOX 11/11/2019, 5-FU and oxaliplatin dose reduced secondary to neutropenia and thrombocytopenia following cycle one, G-CSF declined Cycle 3 FOLFOX 11/26/2019 Cycle 4 FOLFOX 12/11/2019, oxaliplatin held due to neutropenia and thrombocytopenia Cycle 5 FOLFOX 12/31/2019 Cycle 6 FOLFOX 01/14/2020 (oxaliplatin held, 5-fluorouracil dose reduced due to mucositis) CT chest 01/27/2020-improvement in left lung nodules, progressive airspace disease with traction bronchiectasis throughout the right lung, previously noted right lung nodules are obscured, mildly enlarged right paratracheal lymph nodes-not hypermetabolic on prior PET, likely reactive Cycle 7 FOLFOX 01/28/2020 Cycle 8 FOLFOX 02/11/2020 (oxaliplatin held due to neutropenia and thrombocytopenia) Cycle 9 FOLFOX 02/25/2020 (oxaliplatin held due to thrombocytopenia) Cycle 10 FOLFOX 03/11/2020 (oxaliplatin held secondary to neuropathy) Cycle 11 FOLFOX 03/25/2020  (oxaliplatin held due to neuropathy) CT chest 04/05/2020-no new or progressive findings.  Interval evolution of presumed postradiation scarring in the right perihilar lung with decrease in the more diffuse groundglass opacity seen previously.  No substantial change in  left lung nodules. 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  No definite CT findings to suggest recurrent tumor.  Stable small left upper lobe pulmonary nodules.  No new or progressive findings.  Stable small right paratracheal lymph nodes.  Stable area of irregular low-attenuation in hepatic segment 4A, site of known prior hepatic metastatic lesion.  No new or progressive findings. Cycle 18 5-fluorouracil 07/06/2020 Cycle 19 5-fluorouracil 07/20/2020 Cycle 20 5-fluorouracil 08/03/2020 Cycle 21 5-fluorouracil 08/17/2020  Cycle 22 5-fluorouracil 08/31/2020 Cycle 23 5-fluorouracil 09/14/2020 CT chest 09/24/2020-increased number and size of pulmonary nodules bilaterally.  Findings in the right upper lobe are concerning for potential lymphangitic spread of tumor.  Right upper lobe findings could also reflect evolving postradiation change. Cycle 1 FOLFIRI planned 10/05/2020   2.   Hypothyroid   3.    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  Port-A-Cath placement 09/29/2019, interventional radiology   5.  Neutropenia and thrombocytopenia following cycle 1 FOLFOX-plan chemotherapy dose reductions, she declined G-CSF   6.  Mucositis following cycle 5 FOLFOX, 5-fluorouracil dose reduced with cycle 6 Mucositis following cycle 22 5-fluorouracil-Magic mouthwash added   7.  Right lung airspace disease/volume loss-likely  toxicity from chest radiation, trial of prednisone 01/29/2020; dyspnea and cough improved 02/11/2020 Progressive cough 03/11/2020-prednisone resumed at a dose of 20 mg daily Cough and dyspnea improved 03/25/2020-prednisone taper to 15 mg daily Improved 04/06/2020-prednisone taper to 10 mg daily Stable 04/21/2020-prednisone taper to 5 mg daily    Disposition: Ms. Prest is currently on active treatment with maintenance 5-fluorouracil.  Recent restaging CTs show evidence of progression involving the lungs.  At today's visit she reports increased cough and dyspnea on exertion.  Dr. Benay Spice reviewed the CT report/images with her and recommends discontinuing current treatment and changing to FOLFIRI plus Panitumumab.  We reviewed potential toxicities associated with irinotecan including bone marrow toxicity, nausea, hair loss, diarrhea (both early and late phase).  She is familiar with potential side effects associated with 5-fluorouracil.  We also discussed potential toxicities associated with Panitumumab including an allergic reaction, diarrhea and rash.  She is most comfortable with beginning FOLFIRI initially and considering the addition of Panitumumab at a later date.  She will return for cycle 1 FOLFIRI on 10/05/2020.  We will see her in follow-up prior to cycle 2 in 3 weeks.  She will contact the office in the interim with any problems.  Patient seen with Dr. Benay Spice.    Ned Card ANP/GNP-BC   09/28/2020  1:21 PM This was a shared visit with Ned Card.  We reviewed the restaging CT findings and images with Ms. [place].  There is clinical and radiologic evidence of disease progression.  We recommend discontinuing the single agent 5-fluorouracil and switching to a different systemic therapy regimen.  I recommend FOLFIRI/panitumumab.  We reviewed potential toxicities associated with irinotecan and panitumumab.  She does not wish to begin panitumumab at present.  The plan is to begin FOLFIRI on  10/05/2020.  A chemotherapy plan was entered today.  I was present for greater than 50% of today's visit.  I performed medical decision making.  Julieanne Manson, MD

## 2020-09-28 NOTE — Progress Notes (Signed)
DISCONTINUE OFF PATHWAY REGIMEN - Colorectal   [id number] (Leucovorin IV D1 + Fluorouracil IV D1/CIV D1,2 + Oxaliplatin IV D1) q14 Days:   A cycle is every 14 days:     Oxaliplatin      Leucovorin      Fluorouracil      Fluorouracil   **Always confirm dose/schedule in your pharmacy ordering system**  REASON: Disease Progression PRIOR TREATMENT: Off Pathway: [id number] (Leucovorin IV D1 + Fluorouracil IV D1/CIV D1,2 + Oxaliplatin IV D1) q14 Days TREATMENT RESPONSE: Partial Response (PR)  START ON PATHWAY REGIMEN - Colorectal     A cycle is every 14 days:     Irinotecan      Leucovorin      Fluorouracil      Fluorouracil   **Always confirm dose/schedule in your pharmacy ordering system**  Patient Characteristics: Distant Metastases, Nonsurgical Candidate, KRAS/NRAS Wild-Type (BRAF V600 Wild-Type/Unknown), Standard Cytotoxic Therapy, Second Line Standard Cytotoxic Therapy, Bevacizumab Ineligible Tumor Location: Rectal Therapeutic Status: Distant Metastases Microsatellite/Mismatch Repair Status: MSS/pMMR BRAF Mutation Status: Wild-Type (no mutation) KRAS/NRAS Mutation Status: Wild-Type (no mutation) Preferred Therapy Approach: Standard Cytotoxic Therapy Standard Cytotoxic Line of Therapy: Second Line Standard Cytotoxic Therapy Bevacizumab Eligibility: Ineligible Intent of Therapy: Non-Curative / Palliative Intent, Discussed with Patient

## 2020-09-28 NOTE — Patient Instructions (Signed)
Implanted Port Home Guide An implanted port is a device that is placed under the skin. It is usually placed in the chest. The device can be used to give IV medicine, to take blood, or for dialysis. You may have an implanted port if: You need IV medicine that would be irritating to the small veins in your hands or arms. You need IV medicines, such as antibiotics, for a long period of time. You need IV nutrition for a long period of time. You need dialysis. When you have a port, your health care provider can choose to use the port instead of veins in your arms for these procedures. You may have fewer limitations when using a port than you would if you used other types of long-term IVs, and you will likely be able to return to normal activities afteryour incision heals. An implanted port has two main parts: Reservoir. The reservoir is the part where a needle is inserted to give medicines or draw blood. The reservoir is round. After it is placed, it appears as a small, raised area under your skin. Catheter. The catheter is a thin, flexible tube that connects the reservoir to a vein. Medicine that is inserted into the reservoir goes into the catheter and then into the vein. How is my port accessed? To access your port: A numbing cream may be placed on the skin over the port site. Your health care provider will put on a mask and sterile gloves. The skin over your port will be cleaned carefully with a germ-killing soap and allowed to dry. Your health care provider will gently pinch the port and insert a needle into it. Your health care provider will check for a blood return to make sure the port is in the vein and is not clogged. If your port needs to remain accessed to get medicine continuously (constant infusion), your health care provider will place a clear bandage (dressing) over the needle site. The dressing and needle will need to be changed every week, or as told by your health care provider. What  is flushing? Flushing helps keep the port from getting clogged. Follow instructions from your health care provider about how and when to flush the port. Ports are usually flushed with saline solution or a medicine called heparin. The need for flushing will depend on how the port is used: If the port is only used from time to time to give medicines or draw blood, the port may need to be flushed: Before and after medicines have been given. Before and after blood has been drawn. As part of routine maintenance. Flushing may be recommended every 4-6 weeks. If a constant infusion is running, the port may not need to be flushed. Throw away any syringes in a disposal container that is meant for sharp items (sharps container). You can buy a sharps container from a pharmacy, or you can make one by using an empty hard plastic bottle with a cover. How long will my port stay implanted? The port can stay in for as long as your health care provider thinks it is needed. When it is time for the port to come out, a surgery will be done to remove it. The surgery will be similar to the procedure that was done to putthe port in. Follow these instructions at home:  Flush your port as told by your health care provider. If you need an infusion over several days, follow instructions from your health care provider about how to take   care of your port site. Make sure you: Wash your hands with soap and water before you change your dressing. If soap and water are not available, use alcohol-based hand sanitizer. Change your dressing as told by your health care provider. Place any used dressings or infusion bags into a plastic bag. Throw that bag in the trash. Keep the dressing that covers the needle clean and dry. Do not get it wet. Do not use scissors or sharp objects near the tube. Keep the tube clamped, unless it is being used. Check your port site every day for signs of infection. Check for: Redness, swelling, or  pain. Fluid or blood. Pus or a bad smell. Protect the skin around the port site. Avoid wearing bra straps that rub or irritate the site. Protect the skin around your port from seat belts. Place a soft pad over your chest if needed. Bathe or shower as told by your health care provider. The site may get wet as long as you are not actively receiving an infusion. Return to your normal activities as told by your health care provider. Ask your health care provider what activities are safe for you. Carry a medical alert card or wear a medical alert bracelet at all times. This will let health care providers know that you have an implanted port in case of an emergency. Get help right away if: You have redness, swelling, or pain at the port site. You have fluid or blood coming from your port site. You have pus or a bad smell coming from the port site. You have a fever. Summary Implanted ports are usually placed in the chest for long-term IV access. Follow instructions from your health care provider about flushing the port and changing bandages (dressings). Take care of the area around your port by avoiding clothing that puts pressure on the area, and by watching for signs of infection. Protect the skin around your port from seat belts. Place a soft pad over your chest if needed. Get help right away if you have a fever or you have redness, swelling, pain, drainage, or a bad smell at the port site. This information is not intended to replace advice given to you by your health care provider. Make sure you discuss any questions you have with your healthcare provider. Document Revised: 06/02/2019 Document Reviewed: 06/02/2019 Elsevier Patient Education  2022 [employer]  

## 2020-09-30 ENCOUNTER — Inpatient Hospital Stay: Payer: Medicare Other

## 2020-10-05 ENCOUNTER — Other Ambulatory Visit: Payer: Self-pay

## 2020-10-05 ENCOUNTER — Inpatient Hospital Stay: Payer: Medicare Other

## 2020-10-05 ENCOUNTER — Inpatient Hospital Stay: Payer: Medicare Other | Attending: Oncology

## 2020-10-05 DIAGNOSIS — D6959 Other secondary thrombocytopenia: Secondary | ICD-10-CM | POA: Insufficient documentation

## 2020-10-05 DIAGNOSIS — D701 Agranulocytosis secondary to cancer chemotherapy: Secondary | ICD-10-CM | POA: Insufficient documentation

## 2020-10-05 DIAGNOSIS — C787 Secondary malignant neoplasm of liver and intrahepatic bile duct: Secondary | ICD-10-CM | POA: Diagnosis present

## 2020-10-05 DIAGNOSIS — C20 Malignant neoplasm of rectum: Secondary | ICD-10-CM | POA: Insufficient documentation

## 2020-10-05 DIAGNOSIS — Z5111 Encounter for antineoplastic chemotherapy: Secondary | ICD-10-CM | POA: Insufficient documentation

## 2020-10-05 LAB — CBC WITH DIFFERENTIAL (CANCER CENTER ONLY)
Abs Immature Granulocytes: 0.03 10*3/uL (ref 0.00–0.07)
Basophils Absolute: 0 10*3/uL (ref 0.0–0.1)
Basophils Relative: 0 %
Eosinophils Absolute: 0.2 10*3/uL (ref 0.0–0.5)
Eosinophils Relative: 3 %
HCT: 37.4 % (ref 36.0–46.0)
Hemoglobin: 12.6 g/dL (ref 12.0–15.0)
Immature Granulocytes: 1 %
Lymphocytes Relative: 12 %
Lymphs Abs: 0.7 10*3/uL (ref 0.7–4.0)
MCH: 33.1 pg (ref 26.0–34.0)
MCHC: 33.7 g/dL (ref 30.0–36.0)
MCV: 98.2 fL (ref 80.0–100.0)
Monocytes Absolute: 0.8 10*3/uL (ref 0.1–1.0)
Monocytes Relative: 14 %
Neutro Abs: 3.8 10*3/uL (ref 1.7–7.7)
Neutrophils Relative %: 70 %
Platelet Count: 106 10*3/uL — ABNORMAL LOW (ref 150–400)
RBC: 3.81 MIL/uL — ABNORMAL LOW (ref 3.87–5.11)
RDW: 14.3 % (ref 11.5–15.5)
WBC Count: 5.4 10*3/uL (ref 4.0–10.5)
nRBC: 0 % (ref 0.0–0.2)

## 2020-10-05 LAB — CMP (CANCER CENTER ONLY)
ALT: 39 U/L (ref 0–44)
AST: 44 U/L — ABNORMAL HIGH (ref 15–41)
Albumin: 3.5 g/dL (ref 3.5–5.0)
Alkaline Phosphatase: 108 U/L (ref 38–126)
Anion gap: 11 (ref 5–15)
BUN: 15 mg/dL (ref 8–23)
CO2: 23 mmol/L (ref 22–32)
Calcium: 8.8 mg/dL — ABNORMAL LOW (ref 8.9–10.3)
Chloride: 108 mmol/L (ref 98–111)
Creatinine: 0.73 mg/dL (ref 0.44–1.00)
GFR, Estimated: >60 mL/min (ref >60)
Glucose, Bld: 105 mg/dL — ABNORMAL HIGH (ref 70–99)
Potassium: 3.6 mmol/L (ref 3.5–5.1)
Sodium: 142 mmol/L (ref 135–145)
Total Bilirubin: 1.2 mg/dL (ref 0.3–1.2)
Total Protein: 6.2 g/dL — ABNORMAL LOW (ref 6.5–8.1)

## 2020-10-05 NOTE — Progress Notes (Signed)
Patient expressed concerns about her chemo treatment today and wanted more clarification on side effects etc. Per Lattie Haw, NP, Patients treatment cancelled today, she will be rescheduled to see Lisa/Dr. Benay Spice for more clarification before starting.

## 2020-10-06 ENCOUNTER — Telehealth: Payer: Self-pay | Admitting: Oncology

## 2020-10-06 NOTE — Telephone Encounter (Signed)
Scheduled appt per [facility] staff message - appt for 9/8 for patient ro discuss chemo questions. Left message with appt date and time

## 2020-10-07 ENCOUNTER — Other Ambulatory Visit: Payer: Self-pay

## 2020-10-07 ENCOUNTER — Inpatient Hospital Stay: Payer: Medicare Other

## 2020-10-07 ENCOUNTER — Inpatient Hospital Stay (HOSPITAL_BASED_OUTPATIENT_CLINIC_OR_DEPARTMENT_OTHER): Payer: Medicare Other | Admitting: Oncology

## 2020-10-07 VITALS — BP 162/74 | HR 97 | Temp 98.2°F | Resp 18 | Ht 63.0 in | Wt 211.6 lb

## 2020-10-07 DIAGNOSIS — Z5111 Encounter for antineoplastic chemotherapy: Secondary | ICD-10-CM | POA: Diagnosis not present

## 2020-10-07 DIAGNOSIS — C20 Malignant neoplasm of rectum: Secondary | ICD-10-CM

## 2020-10-07 NOTE — Progress Notes (Signed)
[facility] OFFICE PROGRESS NOTE   Diagnosis: Rectal cancer  INTERVAL HISTORY:   Samantha Lutz was scheduled to begin FOLFIRI on 10/05/2020.  Chemotherapy was canceled since she had further questions regarding treatment options.  She reports stable exertional dyspnea.  No new complaint.  Objective:  Vital signs in last 24 hours:  Blood pressure (!) 162/74, pulse 97, temperature 98.2 F (36.8 C), resp. rate 18, height '5\' 3"'  (1.6 m), weight 211 lb 9.6 oz (96 kg), SpO2 96 %.   Resp: Lungs with decreased breath sounds at the right lower chest, no respiratory distress, no wheezing Cardio: Regular rate and rhythm GI: No hepatosplenomegaly Vascular: No leg edema   Portacath/PICC-without erythema  Lab Results:  Lab Results  Component Value Date   WBC 5.4 10/05/2020   HGB 12.6 10/05/2020   HCT 37.4 10/05/2020   MCV 98.2 10/05/2020   PLT 106 (L) 10/05/2020   NEUTROABS 3.8 10/05/2020    CMP  Lab Results  Component Value Date   NA 142 10/05/2020   K 3.6 10/05/2020   CL 108 10/05/2020   CO2 23 10/05/2020   GLUCOSE 105 (H) 10/05/2020   BUN 15 10/05/2020   CREATININE 0.73 10/05/2020   CALCIUM 8.8 (L) 10/05/2020   PROT 6.2 (L) 10/05/2020   ALBUMIN 3.5 10/05/2020   AST 44 (H) 10/05/2020   ALT 39 10/05/2020   ALKPHOS 108 10/05/2020   BILITOT 1.2 10/05/2020   GFRNONAA >60 10/05/2020   GFRAA >60 10/28/2019    Lab Results  Component Value Date   CEA1 <1.00 06/01/2020   CEA <1.00 06/01/2020    No results found for: INR, LABPROT  Imaging:  No results found.  Medications: I have reviewed the patient's current medications.   Assessment/Plan: Rectal cancer Mass at 7 cm from the anal verge on colonoscopy 08/09/2017, biopsy revealed invasive adenocarcinoma Staging CTs 08/17/2017-no evidence of metastatic disease, asymmetric thickening in the mid rectum MR pelvis 09/01/2017, T3N0 lesion beginning at 6.3 cm from the anal sphincter Radiation/Xeloda initiated  09/17/2017, completed 10/25/2017 Low anterior resection/diverting ileostomy 12/21/2017,ypT3,ypN1a tumor.  Lymphovascular invasion present, intact mismatch repair protein expression, treatment effect present (TRS 1).  Mismatch repair protein IHC normal; Foundation 1-KRAS/NRAS wild-type, microsatellite status and tumor mutational burden could not be determined. Cycle 1 adjuvant Xeloda beginning 01/21/2018 Cycle 2 adjuvant Xeloda beginning 02/11/2018 Xeloda discontinued after cycle 2 secondary to patient preference Ileostomy takedown 07/10/2018 CTs 09/07/2018- multiple live small pulmonary nodules concerning for metastatic disease CT chest 12/10/2018-multiple bilateral lung nodules, some have increased in size CT chest 04/08/2019-mild enlargement of bilateral lung nodules Status post SBRT multiple lung nodules 05/06/2019, 05/08/2019, 05/13/2019 CT chest 08/28/2019-improvement and resolution in majority of right-sided pulmonary nodules, a superior segment right lower lobe nodule has increased, no new right-sided nodules.  Progressive enlargement of left-sided pulmonary nodules PET scan 09/09/2019-hypermetabolic right lower lobe nodule, 2 hypermetabolic left upper lobe nodules with an additional 0.7 cm left upper lobe nodule below PET resolution, groundglass opacity in the mid to right lower lobe with associated hypermetabolism consistent with postradiation change, hypermetabolic central segment 4A liver lesion Cycle 1 FOLFOX 10/14/2019 Cycle  2FOLFOX 11/11/2019, 5-FU and oxaliplatin dose reduced secondary to neutropenia and thrombocytopenia following cycle one, G-CSF declined Cycle 3 FOLFOX 11/26/2019 Cycle 4 FOLFOX 12/11/2019, oxaliplatin held due to neutropenia and thrombocytopenia Cycle 5 FOLFOX 12/31/2019 Cycle 6 FOLFOX 01/14/2020 (oxaliplatin held, 5-fluorouracil dose reduced due to mucositis) CT chest 01/27/2020-improvement in left lung nodules, progressive airspace disease with traction bronchiectasis throughout  the right lung, previously noted right lung nodules are obscured, mildly enlarged right paratracheal lymph nodes-not hypermetabolic on prior PET, likely reactive Cycle 7 FOLFOX 01/28/2020 Cycle 8 FOLFOX 02/11/2020 (oxaliplatin held due to neutropenia and thrombocytopenia) Cycle 9 FOLFOX 02/25/2020 (oxaliplatin held due to thrombocytopenia) Cycle 10 FOLFOX 03/11/2020 (oxaliplatin held secondary to neuropathy) Cycle 11 FOLFOX 03/25/2020 (oxaliplatin held due to neuropathy) CT chest 04/05/2020-no new or progressive findings.  Interval evolution of presumed postradiation scarring in the right perihilar lung with decrease in the more diffuse groundglass opacity seen previously.  No substantial change in left lung nodules. 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  No definite CT findings to suggest recurrent tumor.  Stable small left upper lobe pulmonary nodules.  No new or progressive findings.  Stable small right paratracheal lymph nodes.  Stable area of irregular low-attenuation in hepatic segment 4A, site of known prior hepatic metastatic lesion.  No new or progressive findings. Cycle 18 5-fluorouracil 07/06/2020 Cycle 19 5-fluorouracil 07/20/2020 Cycle 20 5-fluorouracil 08/03/2020 Cycle 21 5-fluorouracil 08/17/2020  Cycle 22 5-fluorouracil 08/31/2020 Cycle 23 5-fluorouracil 09/14/2020 CT chest 09/24/2020-increased number and size of pulmonary nodules bilaterally.  Findings in the right upper lobe are concerning for potential lymphangitic spread of tumor.  Right upper lobe findings could also reflect evolving postradiation change. Cycle 1 FOLFIRI 10/12/2020   2.   Hypothyroid   3.    History of mild thrombocytopenia  secondary to chemotherapy and radiation   4.  Port-A-Cath placement 09/29/2019, interventional radiology   5.  Neutropenia and thrombocytopenia following cycle 1 FOLFOX-plan chemotherapy dose reductions, she declined G-CSF   6.  Mucositis following cycle 5 FOLFOX, 5-fluorouracil dose reduced with cycle 6 Mucositis following cycle 22 5-fluorouracil-Magic mouthwash added   7.  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  We discussed treatment options again today.  The tumor is RASTs wild-type.  I recommend treatment with FOLFIRI/panitumumab for the highest chance of a clinical response.  We discussed treatment with FOLFIRI alone and single agent panitumumab.  She understands the chance of a clinical response with single agent panitumumab is low.  She appears to have a good understanding of treatment options and would like to proceed with FOLFIRI.  We will consider adding panitumumab after 1 or 2 cycles.  She will be rescheduled for FOLFIRI on 10/12/2020.  She will return for an office visit prior to cycle 2 on 10/26/2020.  Betsy Coder, MD  10/07/2020  9:47 AM

## 2020-10-12 ENCOUNTER — Inpatient Hospital Stay: Payer: Medicare Other

## 2020-10-12 ENCOUNTER — Other Ambulatory Visit: Payer: Self-pay

## 2020-10-12 ENCOUNTER — Encounter: Payer: Self-pay | Admitting: Nurse Practitioner

## 2020-10-12 VITALS — BP 140/74 | HR 80 | Temp 97.9°F | Resp 18

## 2020-10-12 DIAGNOSIS — Z95828 Presence of other vascular implants and grafts: Secondary | ICD-10-CM

## 2020-10-12 DIAGNOSIS — C20 Malignant neoplasm of rectum: Secondary | ICD-10-CM

## 2020-10-12 DIAGNOSIS — Z5111 Encounter for antineoplastic chemotherapy: Secondary | ICD-10-CM | POA: Diagnosis not present

## 2020-10-12 LAB — CMP (CANCER CENTER ONLY)
ALT: 42 U/L (ref 0–44)
AST: 52 U/L — ABNORMAL HIGH (ref 15–41)
Albumin: 3.6 g/dL (ref 3.5–5.0)
Alkaline Phosphatase: 107 U/L (ref 38–126)
Anion gap: 9 (ref 5–15)
BUN: 14 mg/dL (ref 8–23)
CO2: 25 mmol/L (ref 22–32)
Calcium: 9.2 mg/dL (ref 8.9–10.3)
Chloride: 107 mmol/L (ref 98–111)
Creatinine: 0.83 mg/dL (ref 0.44–1.00)
GFR, Estimated: >60 mL/min (ref >60)
Glucose, Bld: 95 mg/dL (ref 70–99)
Potassium: 4 mmol/L (ref 3.5–5.1)
Sodium: 141 mmol/L (ref 135–145)
Total Bilirubin: 1.1 mg/dL (ref 0.3–1.2)
Total Protein: 6.6 g/dL (ref 6.5–8.1)

## 2020-10-12 LAB — CBC WITH DIFFERENTIAL (CANCER CENTER ONLY)
Abs Immature Granulocytes: 0.04 10*3/uL (ref 0.00–0.07)
Basophils Absolute: 0 10*3/uL (ref 0.0–0.1)
Basophils Relative: 0 %
Eosinophils Absolute: 0.2 10*3/uL (ref 0.0–0.5)
Eosinophils Relative: 3 %
HCT: 37.9 % (ref 36.0–46.0)
Hemoglobin: 12.9 g/dL (ref 12.0–15.0)
Immature Granulocytes: 1 %
Lymphocytes Relative: 15 %
Lymphs Abs: 1 10*3/uL (ref 0.7–4.0)
MCH: 33 pg (ref 26.0–34.0)
MCHC: 34 g/dL (ref 30.0–36.0)
MCV: 96.9 fL (ref 80.0–100.0)
Monocytes Absolute: 0.9 10*3/uL (ref 0.1–1.0)
Monocytes Relative: 13 %
Neutro Abs: 4.7 10*3/uL (ref 1.7–7.7)
Neutrophils Relative %: 68 %
Platelet Count: 126 10*3/uL — ABNORMAL LOW (ref 150–400)
RBC: 3.91 MIL/uL (ref 3.87–5.11)
RDW: 13.9 % (ref 11.5–15.5)
WBC Count: 6.8 10*3/uL (ref 4.0–10.5)
nRBC: 0 % (ref 0.0–0.2)

## 2020-10-12 MED ORDER — ATROPINE SULFATE 1 MG/ML IJ SOLN
0.5000 mg | Freq: Once | INTRAMUSCULAR | Status: AC | PRN
  Administered 2020-10-12: 0.5 mg via INTRAVENOUS
  Filled 2020-10-12: qty 1

## 2020-10-12 MED ORDER — HEPARIN SOD (PORK) LOCK FLUSH 100 UNIT/ML IV SOLN: 500.0000 [IU] | Freq: Once | INTRAVENOUS | Status: DC

## 2020-10-12 MED ORDER — SODIUM CHLORIDE 0.9 % IV SOLN
10.0000 mg | Freq: Once | INTRAVENOUS | Status: AC
  Administered 2020-10-12: 10 mg via INTRAVENOUS
  Filled 2020-10-12: qty 1

## 2020-10-12 MED ORDER — PALONOSETRON HCL INJECTION 0.25 MG/5ML
0.2500 mg | Freq: Once | INTRAVENOUS | Status: AC
  Administered 2020-10-12: 0.25 mg via INTRAVENOUS
  Filled 2020-10-12: qty 5

## 2020-10-12 MED ORDER — SODIUM CHLORIDE 0.9% FLUSH: 10.0000 mL | Freq: Once | INTRAVENOUS | Status: DC

## 2020-10-12 MED ORDER — FLUOROURACIL CHEMO INJECTION 2.5 GM/50ML
300.0000 mg/m2 | Freq: Once | INTRAVENOUS | Status: AC
  Administered 2020-10-12: 600 mg via INTRAVENOUS
  Filled 2020-10-12: qty 12

## 2020-10-12 MED ORDER — SODIUM CHLORIDE 0.9 % IV SOLN: Freq: Once | INTRAVENOUS | Status: AC

## 2020-10-12 MED ORDER — SODIUM CHLORIDE 0.9 % IV SOLN
1800.0000 mg/m2 | INTRAVENOUS | Status: DC
  Administered 2020-10-12: 3700 mg via INTRAVENOUS
  Filled 2020-10-12: qty 74

## 2020-10-12 MED ORDER — SODIUM CHLORIDE 0.9 % IV SOLN
300.0000 mg/m2 | Freq: Once | INTRAVENOUS | Status: AC
  Administered 2020-10-12: 618 mg via INTRAVENOUS
  Filled 2020-10-12: qty 30.9

## 2020-10-12 MED ORDER — SODIUM CHLORIDE 0.9 % IV SOLN
180.0000 mg/m2 | Freq: Once | INTRAVENOUS | Status: AC
  Administered 2020-10-12: 380 mg via INTRAVENOUS
  Filled 2020-10-12: qty 5

## 2020-10-12 NOTE — Patient Instructions (Signed)
[facility]  Discharge Instructions: Thank you for choosing [facility] to provide your oncology and hematology care.   If you have a lab appointment with the [facility], please go directly to the [facility] and check in at the registration area.   Wear comfortable clothing and clothing appropriate for easy access to any Portacath or PICC line.   We strive to give you quality time with your provider. You may need to reschedule your appointment if you arrive late (15 or more minutes).  Arriving late affects you and other patients whose appointments are after yours.  Also, if you miss three or more appointments without notifying the office, you may be dismissed from the clinic at the provider's discretion.      For prescription refill requests, have your pharmacy contact our office and allow 72 hours for refills to be completed.    Today you received the following chemotherapy and/or immunotherapy agents IRINOTECAN, LEUCOVORIN, 5FU      To help prevent nausea and vomiting after your treatment, we encourage you to take your nausea medication as directed.  BELOW ARE SYMPTOMS THAT SHOULD BE REPORTED IMMEDIATELY: *FEVER GREATER THAN 100.4 F (38 C) OR HIGHER *CHILLS OR SWEATING *NAUSEA AND VOMITING THAT IS NOT CONTROLLED WITH YOUR NAUSEA MEDICATION *UNUSUAL SHORTNESS OF BREATH *UNUSUAL BRUISING OR BLEEDING *URINARY PROBLEMS (pain or burning when urinating, or frequent urination) *BOWEL PROBLEMS (unusual diarrhea, constipation, pain near the anus) TENDERNESS IN MOUTH AND THROAT WITH OR WITHOUT PRESENCE OF ULCERS (sore throat, sores in mouth, or a toothache) UNUSUAL RASH, SWELLING OR PAIN  UNUSUAL VAGINAL DISCHARGE OR ITCHING   Items with * indicate a potential emergency and should be followed up as soon as possible or go to the Emergency Department if any problems should occur.  Please show the CHEMOTHERAPY ALERT CARD or IMMUNOTHERAPY ALERT CARD  at check-in to the Emergency Department and triage nurse.  Should you have questions after your visit or need to cancel or reschedule your appointment, please contact [facility]  Dept: [phone]  and follow the prompts.  Office hours are 8:00 a.m. to 4:30 p.m. Monday - Friday. Please note that voicemails left after 4:00 p.m. may not be returned until the following business day.  We are closed weekends and major holidays. You have access to a nurse at all times for urgent questions. Please call the main number to the clinic Dept: [phone] and follow the prompts.   For any non-urgent questions, you may also contact your provider using MyChart. We now offer e-Visits for anyone 90 and older to request care online for non-urgent symptoms. For details visit [email or web address].   Also download the MyChart app! Go to the app store, search "MyChart", open the app, select [facility], and log in with your MyChart username and password.  Due to Covid, a mask is required upon entering the hospital/clinic. If you do not have a mask, one will be given to you upon arrival. For doctor visits, patients may have 1 support person aged 20 or older with them. For treatment visits, patients cannot have anyone with them due to current Covid guidelines and our immunocompromised population.   Irinotecan injection What is this medication? IRINOTECAN (ir in oh TEE kan ) is a chemotherapy drug. It is used to treat colon and rectal cancer. This medicine may be used for other purposes; ask your health care provider or pharmacist if you have questions. COMMON BRAND  NAME(S): Camptosar What should I tell my care team before I take this medication? They need to know if you have any of these conditions: dehydration diarrhea infection (especially a virus infection such as chickenpox, cold sores, or herpes) liver disease low blood counts, like low white cell, platelet, or red cell  counts low levels of calcium, magnesium, or potassium in the blood recent or ongoing radiation therapy an unusual or allergic reaction to irinotecan, other medicines, foods, dyes, or preservatives pregnant or trying to get pregnant breast-feeding How should I use this medication? This drug is given as an infusion into a vein. It is administered in a hospital or clinic by a specially trained health care professional. Talk to your pediatrician regarding the use of this medicine in children. Special care may be needed. Overdosage: If you think you have taken too much of this medicine contact a poison control center or emergency room at once. NOTE: This medicine is only for you. Do not share this medicine with others. What if I miss a dose? It is important not to miss your dose. Call your doctor or health care professional if you are unable to keep an appointment. What may interact with this medication? Do not take this medicine with any of the following medications: cobicistat itraconazole This medicine may interact with the following medications: antiviral medicines for HIV or AIDS certain antibiotics like rifampin or rifabutin certain medicines for fungal infections like ketoconazole, posaconazole, and voriconazole certain medicines for seizures like carbamazepine, phenobarbital, phenotoin clarithromycin gemfibrozil nefazodone [facility]. John's Wort This list may not describe all possible interactions. Give your health care provider a list of all the medicines, herbs, non-prescription drugs, or dietary supplements you use. Also tell them if you smoke, drink alcohol, or use illegal drugs. Some items may interact with your medicine. What should I watch for while using this medication? Your condition will be monitored carefully while you are receiving this medicine. You will need important blood work done while you are taking this medicine. This drug may make you feel generally unwell. This is not  uncommon, as chemotherapy can affect healthy cells as well as cancer cells. Report any side effects. Continue your course of treatment even though you feel ill unless your doctor tells you to stop. In some cases, you may be given additional medicines to help with side effects. Follow all directions for their use. You may get drowsy or dizzy. Do not drive, use machinery, or do anything that needs mental alertness until you know how this medicine affects you. Do not stand or sit up quickly, especially if you are an older patient. This reduces the risk of dizzy or fainting spells. Call your health care professional for advice if you get a fever, chills, or sore throat, or other symptoms of a cold or flu. Do not treat yourself. This medicine decreases your body's ability to fight infections. Try to avoid being around people who are sick. Avoid taking products that contain aspirin, acetaminophen, ibuprofen, naproxen, or ketoprofen unless instructed by your doctor. These medicines may hide a fever. This medicine may increase your risk to bruise or bleed. Call your doctor or health care professional if you notice any unusual bleeding. Be careful brushing and flossing your teeth or using a toothpick because you may get an infection or bleed more easily. If you have any dental work done, tell your dentist you are receiving this medicine. Do not become pregnant while taking this medicine or for 6 months after stopping  it. Women should inform their health care professional if they wish to become pregnant or think they might be pregnant. Men should not father a child while taking this medicine and for 3 months after stopping it. There is potential for serious side effects to an unborn child. Talk to your health care professional for more information. Do not breast-feed an infant while taking this medicine or for 7 days after stopping it. This medicine has caused ovarian failure in some women. This medicine may make it  more difficult to get pregnant. Talk to your health care professional if you are concerned about your fertility. This medicine has caused decreased sperm counts in some men. This may make it more difficult to father a child. Talk to your health care professional if you are concerned about your fertility. What side effects may I notice from receiving this medication? Side effects that you should report to your doctor or health care professional as soon as possible: allergic reactions like skin rash, itching or hives, swelling of the face, lips, or tongue chest pain diarrhea flushing, runny nose, sweating during infusion low blood counts - this medicine may decrease the number of white blood cells, red blood cells and platelets. You may be at increased risk for infections and bleeding. nausea, vomiting pain, swelling, warmth in the leg signs of decreased platelets or bleeding - bruising, pinpoint red spots on the skin, black, tarry stools, blood in the urine signs of infection - fever or chills, cough, sore throat, pain or difficulty passing urine signs of decreased red blood cells - unusually weak or tired, fainting spells, lightheadedness Side effects that usually do not require medical attention (report to your doctor or health care professional if they continue or are bothersome): constipation hair loss headache loss of appetite mouth sores stomach pain This list may not describe all possible side effects. Call your doctor for medical advice about side effects. You may report side effects to FDA at 1-800-FDA-1088. Where should I keep my medication? This drug is given in a hospital or clinic and will not be stored at home. NOTE: This sheet is a summary. It may not cover all possible information. If you have questions about this medicine, talk to your doctor, pharmacist, or health care provider.  2022 Elsevier/Gold Standard (2018-12-17 17:46:13)  Leucovorin injection What is this  medication? LEUCOVORIN (loo koe VOR in) is used to prevent or treat the harmful effects of some medicines. This medicine is used to treat anemia caused by a low amount of folic acid in the body. It is also used with 5-fluorouracil (5-FU) to treat colon cancer. This medicine may be used for other purposes; ask your health care provider or pharmacist if you have questions. What should I tell my care team before I take this medication? They need to know if you have any of these conditions: anemia from low levels of vitamin B-12 in the blood an unusual or allergic reaction to leucovorin, folic acid, other medicines, foods, dyes, or preservatives pregnant or trying to get pregnant breast-feeding How should I use this medication? This medicine is for injection into a muscle or into a vein. It is given by a health care professional in a hospital or clinic setting. Talk to your pediatrician regarding the use of this medicine in children. Special care may be needed. Overdosage: If you think you have taken too much of this medicine contact a poison control center or emergency room at once. NOTE: This medicine is only for  you. Do not share this medicine with others. What if I miss a dose? This does not apply. What may interact with this medication? capecitabine fluorouracil phenobarbital phenytoin primidone trimethoprim-sulfamethoxazole This list may not describe all possible interactions. Give your health care provider a list of all the medicines, herbs, non-prescription drugs, or dietary supplements you use. Also tell them if you smoke, drink alcohol, or use illegal drugs. Some items may interact with your medicine. What should I watch for while using this medication? Your condition will be monitored carefully while you are receiving this medicine. This medicine may increase the side effects of 5-fluorouracil, 5-FU. Tell your doctor or health care professional if you have diarrhea or mouth sores  that do not get better or that get worse. What side effects may I notice from receiving this medication? Side effects that you should report to your doctor or health care professional as soon as possible: allergic reactions like skin rash, itching or hives, swelling of the face, lips, or tongue breathing problems fever, infection mouth sores unusual bleeding or bruising unusually weak or tired Side effects that usually do not require medical attention (report to your doctor or health care professional if they continue or are bothersome): constipation or diarrhea loss of appetite nausea, vomiting This list may not describe all possible side effects. Call your doctor for medical advice about side effects. You may report side effects to FDA at 1-800-FDA-1088. Where should I keep my medication? This drug is given in a hospital or clinic and will not be stored at home. NOTE: This sheet is a summary. It may not cover all possible information. If you have questions about this medicine, talk to your doctor, pharmacist, or health care provider.  2022 Elsevier/Gold Standard (2007-07-23 16:50:29)  Fluorouracil, 5-FU injection What is this medication? FLUOROURACIL, 5-FU (flure oh YOOR a sil) is a chemotherapy drug. It slows the growth of cancer cells. This medicine is used to treat many types of cancer like breast cancer, colon or rectal cancer, pancreatic cancer, and stomach cancer. This medicine may be used for other purposes; ask your health care provider or pharmacist if you have questions. COMMON BRAND NAME(S): Adrucil What should I tell my care team before I take this medication? They need to know if you have any of these conditions: blood disorders dihydropyrimidine dehydrogenase (DPD) deficiency infection (especially a virus infection such as chickenpox, cold sores, or herpes) kidney disease liver disease malnourished, poor nutrition recent or ongoing radiation therapy an unusual or  allergic reaction to fluorouracil, other chemotherapy, other medicines, foods, dyes, or preservatives pregnant or trying to get pregnant breast-feeding How should I use this medication? This drug is given as an infusion or injection into a vein. It is administered in a hospital or clinic by a specially trained health care professional. Talk to your pediatrician regarding the use of this medicine in children. Special care may be needed. Overdosage: If you think you have taken too much of this medicine contact a poison control center or emergency room at once. NOTE: This medicine is only for you. Do not share this medicine with others. What if I miss a dose? It is important not to miss your dose. Call your doctor or health care professional if you are unable to keep an appointment. What may interact with this medication? Do not take this medicine with any of the following medications: live virus vaccines This medicine may also interact with the following medications: medicines that treat or prevent blood clots  like warfarin, enoxaparin, and dalteparin This list may not describe all possible interactions. Give your health care provider a list of all the medicines, herbs, non-prescription drugs, or dietary supplements you use. Also tell them if you smoke, drink alcohol, or use illegal drugs. Some items may interact with your medicine. What should I watch for while using this medication? Visit your doctor for checks on your progress. This drug may make you feel generally unwell. This is not uncommon, as chemotherapy can affect healthy cells as well as cancer cells. Report any side effects. Continue your course of treatment even though you feel ill unless your doctor tells you to stop. In some cases, you may be given additional medicines to help with side effects. Follow all directions for their use. Call your doctor or health care professional for advice if you get a fever, chills or sore throat, or  other symptoms of a cold or flu. Do not treat yourself. This drug decreases your body's ability to fight infections. Try to avoid being around people who are sick. This medicine may increase your risk to bruise or bleed. Call your doctor or health care professional if you notice any unusual bleeding. Be careful brushing and flossing your teeth or using a toothpick because you may get an infection or bleed more easily. If you have any dental work done, tell your dentist you are receiving this medicine. Avoid taking products that contain aspirin, acetaminophen, ibuprofen, naproxen, or ketoprofen unless instructed by your doctor. These medicines may hide a fever. Do not become pregnant while taking this medicine. Women should inform their doctor if they wish to become pregnant or think they might be pregnant. There is a potential for serious side effects to an unborn child. Talk to your health care professional or pharmacist for more information. Do not breast-feed an infant while taking this medicine. Men should inform their doctor if they wish to father a child. This medicine may lower sperm counts. Do not treat diarrhea with over the counter products. Contact your doctor if you have diarrhea that lasts more than 2 days or if it is severe and watery. This medicine can make you more sensitive to the sun. Keep out of the sun. If you cannot avoid being in the sun, wear protective clothing and use sunscreen. Do not use sun lamps or tanning beds/booths. What side effects may I notice from receiving this medication? Side effects that you should report to your doctor or health care professional as soon as possible: allergic reactions like skin rash, itching or hives, swelling of the face, lips, or tongue low blood counts - this medicine may decrease the number of white blood cells, red blood cells and platelets. You may be at increased risk for infections and bleeding. signs of infection - fever or chills,  cough, sore throat, pain or difficulty passing urine signs of decreased platelets or bleeding - bruising, pinpoint red spots on the skin, black, tarry stools, blood in the urine signs of decreased red blood cells - unusually weak or tired, fainting spells, lightheadedness breathing problems changes in vision chest pain mouth sores nausea and vomiting pain, swelling, redness at site where injected pain, tingling, numbness in the hands or feet redness, swelling, or sores on hands or feet stomach pain unusual bleeding Side effects that usually do not require medical attention (report to your doctor or health care professional if they continue or are bothersome): changes in finger or toe nails diarrhea dry or itchy skin hair loss  headache loss of appetite sensitivity of eyes to the light stomach upset unusually teary eyes This list may not describe all possible side effects. Call your doctor for medical advice about side effects. You may report side effects to FDA at 1-800-FDA-1088. Where should I keep my medication? This drug is given in a hospital or clinic and will not be stored at home. NOTE: This sheet is a summary. It may not cover all possible information. If you have questions about this medicine, talk to your doctor, pharmacist, or health care provider.  2022 Elsevier/Gold Standard (2018-12-17 15:00:03)

## 2020-10-12 NOTE — Progress Notes (Signed)
Patient presents for treatment. RN assessment completed along with the following:  Treatment Conditions (labs/vitals/weight) reviewed - Yes, and within treatment parameters.   Oncology Treatment Attestation completed for current therapy- Yes, on date 09/28/20 Informed consent completed and reflects current therapy/intent - Yes, on date 9*13/22             Provider progress note reviewed - Patient not seen by provider today. Most recent note dated 10/07/20 reviewed. Treatment/Antibody/Supportive plan reviewed - Blank single:[id number]::Yes, and there are no adjustments needed for today's treatment."} S&H and other orders reviewed - Yes, and there are no additional orders identified. Previous treatment date reviewed - Yes, and the appropriate amount of time has elapsed between treatments.   Patient to proceed with treatment.

## 2020-10-13 ENCOUNTER — Telehealth: Payer: Self-pay | Admitting: *Deleted

## 2020-10-13 ENCOUNTER — Telehealth: Payer: Self-pay

## 2020-10-13 NOTE — Telephone Encounter (Signed)
Left VM re: MyChart message--OK to take prn prochlorperazine for nausea. The shaky sensation should pass--could be r/t steroid. Call if nausea is not controlled.

## 2020-10-13 NOTE — Telephone Encounter (Signed)
24 hour call back done-no answer- left message to cal with any concerns or questions.

## 2020-10-14 ENCOUNTER — Inpatient Hospital Stay: Payer: Medicare Other

## 2020-10-14 ENCOUNTER — Other Ambulatory Visit: Payer: Self-pay

## 2020-10-14 DIAGNOSIS — Z95828 Presence of other vascular implants and grafts: Secondary | ICD-10-CM

## 2020-10-14 DIAGNOSIS — Z5111 Encounter for antineoplastic chemotherapy: Secondary | ICD-10-CM | POA: Diagnosis not present

## 2020-10-14 MED ORDER — SODIUM CHLORIDE 0.9% FLUSH
10.0000 mL | Freq: Once | INTRAVENOUS | Status: AC
  Administered 2020-10-14: 10 mL

## 2020-10-14 MED ORDER — HEPARIN SOD (PORK) LOCK FLUSH 100 UNIT/ML IV SOLN
500.0000 [IU] | Freq: Once | INTRAVENOUS | Status: AC
  Administered 2020-10-14: 500 [IU]

## 2020-10-14 NOTE — Patient Instructions (Signed)
[facility]  Discharge Instructions: Thank you for choosing [facility] to provide your oncology and hematology care.   If you have a lab appointment with the [facility], please go directly to the [facility] and check in at the registration area.   Wear comfortable clothing and clothing appropriate for easy access to any Portacath or PICC line.   We strive to give you quality time with your provider. You may need to reschedule your appointment if you arrive late (15 or more minutes).  Arriving late affects you and other patients whose appointments are after yours.  Also, if you miss three or more appointments without notifying the office, you may be dismissed from the clinic at the provider's discretion.      For prescription refill requests, have your pharmacy contact our office and allow 72 hours for refills to be completed.    Today you received the following chemotherapy and/or immunotherapy agents PUMP STOP      To help prevent nausea and vomiting after your treatment, we encourage you to take your nausea medication as directed.  BELOW ARE SYMPTOMS THAT SHOULD BE REPORTED IMMEDIATELY: *FEVER GREATER THAN 100.4 F (38 C) OR HIGHER *CHILLS OR SWEATING *NAUSEA AND VOMITING THAT IS NOT CONTROLLED WITH YOUR NAUSEA MEDICATION *UNUSUAL SHORTNESS OF BREATH *UNUSUAL BRUISING OR BLEEDING *URINARY PROBLEMS (pain or burning when urinating, or frequent urination) *BOWEL PROBLEMS (unusual diarrhea, constipation, pain near the anus) TENDERNESS IN MOUTH AND THROAT WITH OR WITHOUT PRESENCE OF ULCERS (sore throat, sores in mouth, or a toothache) UNUSUAL RASH, SWELLING OR PAIN  UNUSUAL VAGINAL DISCHARGE OR ITCHING   Items with * indicate a potential emergency and should be followed up as soon as possible or go to the Emergency Department if any problems should occur.  Please show the CHEMOTHERAPY ALERT CARD or IMMUNOTHERAPY ALERT CARD at check-in to the  Emergency Department and triage nurse.  Should you have questions after your visit or need to cancel or reschedule your appointment, please contact [facility]  Dept: [phone]  and follow the prompts.  Office hours are 8:00 a.m. to 4:30 p.m. Monday - Friday. Please note that voicemails left after 4:00 p.m. may not be returned until the following business day.  We are closed weekends and major holidays. You have access to a nurse at all times for urgent questions. Please call the main number to the clinic Dept: [phone] and follow the prompts.   For any non-urgent questions, you may also contact your provider using MyChart. We now offer e-Visits for anyone 46 and older to request care online for non-urgent symptoms. For details visit [email or web address].   Also download the MyChart app! Go to the app store, search "MyChart", open the app, select [facility], and log in with your MyChart username and password.  Due to Covid, a mask is required upon entering the hospital/clinic. If you do not have a mask, one will be given to you upon arrival. For doctor visits, patients may have 1 support person aged 23 or older with them. For treatment visits, patients cannot have anyone with them due to current Covid guidelines and our immunocompromised population.

## 2020-10-18 ENCOUNTER — Encounter: Payer: Self-pay | Admitting: Oncology

## 2020-10-19 ENCOUNTER — Inpatient Hospital Stay: Payer: Medicare Other

## 2020-10-19 ENCOUNTER — Inpatient Hospital Stay: Payer: Medicare Other | Admitting: Oncology

## 2020-10-21 ENCOUNTER — Inpatient Hospital Stay: Payer: Medicare Other

## 2020-10-22 ENCOUNTER — Other Ambulatory Visit: Payer: Self-pay

## 2020-10-22 DIAGNOSIS — C20 Malignant neoplasm of rectum: Secondary | ICD-10-CM

## 2020-10-22 NOTE — Progress Notes (Signed)
Per lab orders entered for 10/26/2020 visit

## 2020-10-24 ENCOUNTER — Other Ambulatory Visit: Payer: Self-pay | Admitting: Oncology

## 2020-10-26 ENCOUNTER — Inpatient Hospital Stay: Payer: Medicare Other

## 2020-10-26 ENCOUNTER — Encounter: Payer: Self-pay | Admitting: Nurse Practitioner

## 2020-10-26 ENCOUNTER — Inpatient Hospital Stay (HOSPITAL_BASED_OUTPATIENT_CLINIC_OR_DEPARTMENT_OTHER): Payer: Medicare Other | Admitting: Nurse Practitioner

## 2020-10-26 ENCOUNTER — Other Ambulatory Visit: Payer: Self-pay

## 2020-10-26 VITALS — BP 134/72 | HR 87 | Temp 98.7°F | Resp 18 | Ht 63.0 in | Wt 212.8 lb

## 2020-10-26 DIAGNOSIS — Z5111 Encounter for antineoplastic chemotherapy: Secondary | ICD-10-CM | POA: Diagnosis not present

## 2020-10-26 DIAGNOSIS — C20 Malignant neoplasm of rectum: Secondary | ICD-10-CM | POA: Diagnosis not present

## 2020-10-26 LAB — CMP (CANCER CENTER ONLY)
ALT: 44 U/L (ref 0–44)
AST: 42 U/L — ABNORMAL HIGH (ref 15–41)
Albumin: 3.4 g/dL — ABNORMAL LOW (ref 3.5–5.0)
Alkaline Phosphatase: 105 U/L (ref 38–126)
Anion gap: 9 (ref 5–15)
BUN: 13 mg/dL (ref 8–23)
CO2: 25 mmol/L (ref 22–32)
Calcium: 8.8 mg/dL — ABNORMAL LOW (ref 8.9–10.3)
Chloride: 109 mmol/L (ref 98–111)
Creatinine: 0.93 mg/dL (ref 0.44–1.00)
GFR, Estimated: >60 mL/min (ref >60)
Glucose, Bld: 91 mg/dL (ref 70–99)
Potassium: 3.6 mmol/L (ref 3.5–5.1)
Sodium: 143 mmol/L (ref 135–145)
Total Bilirubin: 1.1 mg/dL (ref 0.3–1.2)
Total Protein: 5.8 g/dL — ABNORMAL LOW (ref 6.5–8.1)

## 2020-10-26 LAB — CBC WITH DIFFERENTIAL (CANCER CENTER ONLY)
Abs Immature Granulocytes: 0 10*3/uL (ref 0.00–0.07)
Basophils Absolute: 0 10*3/uL (ref 0.0–0.1)
Basophils Relative: 1 %
Eosinophils Absolute: 0.1 10*3/uL (ref 0.0–0.5)
Eosinophils Relative: 10 %
HCT: 30.8 % — ABNORMAL LOW (ref 36.0–46.0)
Hemoglobin: 10.5 g/dL — ABNORMAL LOW (ref 12.0–15.0)
Immature Granulocytes: 0 %
Lymphocytes Relative: 46 %
Lymphs Abs: 0.6 10*3/uL — ABNORMAL LOW (ref 0.7–4.0)
MCH: 33.2 pg (ref 26.0–34.0)
MCHC: 34.1 g/dL (ref 30.0–36.0)
MCV: 97.5 fL (ref 80.0–100.0)
Monocytes Absolute: 0.2 10*3/uL (ref 0.1–1.0)
Monocytes Relative: 21 %
Neutro Abs: 0.3 10*3/uL — CL (ref 1.7–7.7)
Neutrophils Relative %: 22 %
Platelet Count: 84 10*3/uL — ABNORMAL LOW (ref 150–400)
RBC: 3.16 MIL/uL — ABNORMAL LOW (ref 3.87–5.11)
RDW: 13.6 % (ref 11.5–15.5)
WBC Count: 1.2 10*3/uL — ABNORMAL LOW (ref 4.0–10.5)
nRBC: 0 % (ref 0.0–0.2)

## 2020-10-26 MED ORDER — PREDNISONE 5 MG PO TABS: 5.0000 mg | ORAL_TABLET | Freq: Every day | ORAL | 2 refills | Status: DC

## 2020-10-26 MED ORDER — SODIUM CHLORIDE 0.9% FLUSH
10.0000 mL | INTRAVENOUS | Status: DC | PRN
  Administered 2020-10-26: 10 mL via INTRAVENOUS

## 2020-10-26 MED ORDER — HEPARIN SOD (PORK) LOCK FLUSH 100 UNIT/ML IV SOLN
500.0000 [IU] | Freq: Once | INTRAVENOUS | Status: AC
  Administered 2020-10-26: 500 [IU] via INTRAVENOUS

## 2020-10-26 MED ORDER — LEVOFLOXACIN 500 MG PO TABS: 500.0000 mg | ORAL_TABLET | Freq: Every day | ORAL | 0 refills | Status: DC

## 2020-10-26 MED ORDER — SULFAMETHOXAZOLE-TRIMETHOPRIM 800-160 MG PO TABS: 1.0000 | ORAL_TABLET | Freq: Two times a day (BID) | ORAL | 0 refills | Status: DC

## 2020-10-26 NOTE — Progress Notes (Signed)
[facility] OFFICE PROGRESS NOTE   Diagnosis: Rectal cancer  INTERVAL HISTORY:   Ms. Mezera returns as scheduled.  She completed cycle 1 FOLFIRI 10/12/2020.  She had mild nausea for 2 to 3 days.  Home nausea medications were effective.  No mouth sores.  No diarrhea.  Some crampy abdominal discomfort.  When leaving the office on day 1 she became emotional.  She felt like her "skin was crawling".  Some dizziness and nausea.  She reports this lasted for about 4 hours.  Objective:  Vital signs in last 24 hours:  Blood pressure 134/72, pulse 87, temperature 98.7 F (37.1 C), temperature source Oral, resp. rate 18, height '5\' 3"'  (1.6 m), weight 212 lb 12.8 oz (96.5 kg), SpO2 96 %.    HEENT: No thrush or ulcers. Resp: Lungs clear bilaterally. Cardio: Regular rate and rhythm. GI: Abdomen soft and nontender.  No hepatomegaly. Vascular: No leg edema. Skin: Palms without erythema. Port-A-Cath without erythema.   Lab Results:  Lab Results  Component Value Date   WBC 1.2 (L) 10/26/2020   HGB 10.5 (L) 10/26/2020   HCT 30.8 (L) 10/26/2020   MCV 97.5 10/26/2020   PLT 84 (L) 10/26/2020   NEUTROABS PENDING 10/26/2020    Imaging:  No results found.  Medications: I have reviewed the patient's current medications.  Assessment/Plan: Rectal cancer Mass at 7 cm from the anal verge on colonoscopy 08/09/2017, biopsy revealed invasive adenocarcinoma Staging CTs 08/17/2017-no evidence of metastatic disease, asymmetric thickening in the mid rectum MR pelvis 09/01/2017, T3N0 lesion beginning at 6.3 cm from the anal sphincter Radiation/Xeloda initiated 09/17/2017, completed 10/25/2017 Low anterior resection/diverting ileostomy 12/21/2017,ypT3,ypN1a tumor.  Lymphovascular invasion present, intact mismatch repair protein expression, treatment effect present (TRS 1).  Mismatch repair protein IHC normal; Foundation 1-KRAS/NRAS wild-type, microsatellite status and tumor mutational burden  could not be determined. Cycle 1 adjuvant Xeloda beginning 01/21/2018 Cycle 2 adjuvant Xeloda beginning 02/11/2018 Xeloda discontinued after cycle 2 secondary to patient preference Ileostomy takedown 07/10/2018 CTs 09/07/2018- multiple live small pulmonary nodules concerning for metastatic disease CT chest 12/10/2018-multiple bilateral lung nodules, some have increased in size CT chest 04/08/2019-mild enlargement of bilateral lung nodules Status post SBRT multiple lung nodules 05/06/2019, 05/08/2019, 05/13/2019 CT chest 08/28/2019-improvement and resolution in majority of right-sided pulmonary nodules, a superior segment right lower lobe nodule has increased, no new right-sided nodules.  Progressive enlargement of left-sided pulmonary nodules PET scan 09/09/2019-hypermetabolic right lower lobe nodule, 2 hypermetabolic left upper lobe nodules with an additional 0.7 cm left upper lobe nodule below PET resolution, groundglass opacity in the mid to right lower lobe with associated hypermetabolism consistent with postradiation change, hypermetabolic central segment 4A liver lesion Cycle 1 FOLFOX 10/14/2019 Cycle  2FOLFOX 11/11/2019, 5-FU and oxaliplatin dose reduced secondary to neutropenia and thrombocytopenia following cycle one, G-CSF declined Cycle 3 FOLFOX 11/26/2019 Cycle 4 FOLFOX 12/11/2019, oxaliplatin held due to neutropenia and thrombocytopenia Cycle 5 FOLFOX 12/31/2019 Cycle 6 FOLFOX 01/14/2020 (oxaliplatin held, 5-fluorouracil dose reduced due to mucositis) CT chest 01/27/2020-improvement in left lung nodules, progressive airspace disease with traction bronchiectasis throughout the right lung, previously noted right lung nodules are obscured, mildly enlarged right paratracheal lymph nodes-not hypermetabolic on prior PET, likely reactive Cycle 7 FOLFOX 01/28/2020 Cycle 8 FOLFOX 02/11/2020 (oxaliplatin held due to neutropenia and thrombocytopenia) Cycle 9 FOLFOX 02/25/2020 (oxaliplatin held due to  thrombocytopenia) Cycle 10 FOLFOX 03/11/2020 (oxaliplatin held secondary to neuropathy) Cycle 11 FOLFOX 03/25/2020 (oxaliplatin held due to neuropathy) CT chest 04/05/2020-no new or progressive findings.  Interval evolution of presumed postradiation scarring in the right perihilar lung with decrease in the more diffuse groundglass opacity seen previously.  No substantial change in left lung nodules. 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  No definite CT findings to suggest recurrent tumor.  Stable small left upper lobe pulmonary nodules.  No new or progressive findings.  Stable small right paratracheal lymph nodes.  Stable area of irregular low-attenuation in hepatic segment 4A, site of known prior hepatic metastatic lesion.  No new or progressive findings. Cycle 18 5-fluorouracil 07/06/2020 Cycle 19 5-fluorouracil 07/20/2020 Cycle 20 5-fluorouracil 08/03/2020 Cycle 21 5-fluorouracil 08/17/2020  Cycle 22 5-fluorouracil 08/31/2020 Cycle 23 5-fluorouracil 09/14/2020 CT chest 09/24/2020-increased number and size of pulmonary nodules bilaterally.  Findings in the right upper lobe are concerning for potential lymphangitic spread of tumor.  Right upper lobe findings could also reflect evolving postradiation change. Cycle 1 FOLFIRI 10/12/2020   2.   Hypothyroid   3.    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  Port-A-Cath placement 09/29/2019, interventional radiology   5.  Neutropenia and thrombocytopenia following cycle 1 FOLFOX-plan chemotherapy dose reductions, she declined G-CSF   6.  Mucositis following cycle 5 FOLFOX, 5-fluorouracil dose reduced with cycle 6 Mucositis  following cycle 22 5-fluorouracil-Magic mouthwash added   7.  Right lung airspace disease/volume loss-likely toxicity from chest radiation, trial of prednisone 01/29/2020; dyspnea and cough improved 02/11/2020 Progressive cough 03/11/2020-prednisone resumed at a dose of 20 mg daily Cough and dyspnea improved 03/25/2020-prednisone taper to 15 mg daily Improved 04/06/2020-prednisone taper to 10 mg daily Stable 04/21/2020-prednisone taper to 5 mg daily    Disposition: Ms. Toppins appears stable.  She has completed 1 cycle of FOLFIRI.  She has significant neutropenia and moderate thrombocytopenia.  We are holding today's treatment and rescheduling for 1 week.  She declines white cell growth factor support.  We discussed chemotherapy dose reductions with cycle 2.  She will begin Bactrim DS.  Neutropenic and thrombocytopenic precautions reviewed.  She understands to contact the office with fever, chills, other signs of infection, bleeding.  She became very emotional cycle 1 day 1 upon leaving the office.  This was likely due to the dexamethasone premedication.  We will reduce the dose to 5 mg with cycle 2.  She agrees with this plan.  She will return for lab, follow-up, cycle 2 FOLFIRI in 1 week.  She will contact the office in the interim as outlined above or with any other problems.    Ned Card ANP/GNP-BC   10/26/2020  8:48 AM

## 2020-10-26 NOTE — Patient Instructions (Signed)
Implanted Port Home Guide An implanted port is a device that is placed under the skin. It is usually placed in the chest. The device can be used to give IV medicine, to take blood, or for dialysis. You may have an implanted port if: You need IV medicine that would be irritating to the small veins in your hands or arms. You need IV medicines, such as antibiotics, for a long period of time. You need IV nutrition for a long period of time. You need dialysis. When you have a port, your health care provider can choose to use the port instead of veins in your arms for these procedures. You may have fewer limitations when using a port than you would if you used other types of long-term IVs, and you will likely be able to return to normal activities after your incision heals. An implanted port has two main parts: Reservoir. The reservoir is the part where a needle is inserted to give medicines or draw blood. The reservoir is round. After it is placed, it appears as a small, raised area under your skin. Catheter. The catheter is a thin, flexible tube that connects the reservoir to a vein. Medicine that is inserted into the reservoir goes into the catheter and then into the vein. How is my port accessed? To access your port: A numbing cream may be placed on the skin over the port site. Your health care provider will put on a mask and sterile gloves. The skin over your port will be cleaned carefully with a germ-killing soap and allowed to dry. Your health care provider will gently pinch the port and insert a needle into it. Your health care provider will check for a blood return to make sure the port is in the vein and is not clogged. If your port needs to remain accessed to get medicine continuously (constant infusion), your health care provider will place a clear bandage (dressing) over the needle site. The dressing and needle will need to be changed every week, or as told by your health care provider. What  is flushing? Flushing helps keep the port from getting clogged. Follow instructions from your health care provider about how and when to flush the port. Ports are usually flushed with saline solution or a medicine called heparin. The need for flushing will depend on how the port is used: If the port is only used from time to time to give medicines or draw blood, the port may need to be flushed: Before and after medicines have been given. Before and after blood has been drawn. As part of routine maintenance. Flushing may be recommended every 4-6 weeks. If a constant infusion is running, the port may not need to be flushed. Throw away any syringes in a disposal container that is meant for sharp items (sharps container). You can buy a sharps container from a pharmacy, or you can make one by using an empty hard plastic bottle with a cover. How long will my port stay implanted? The port can stay in for as long as your health care provider thinks it is needed. When it is time for the port to come out, a surgery will be done to remove it. The surgery will be similar to the procedure that was done to put the port in. Follow these instructions at home:  Flush your port as told by your health care provider. If you need an infusion over several days, follow instructions from your health care provider about how   to take care of your port site. Make sure you: Wash your hands with soap and water before you change your dressing. If soap and water are not available, use alcohol-based hand sanitizer. Change your dressing as told by your health care provider. Place any used dressings or infusion bags into a plastic bag. Throw that bag in the trash. Keep the dressing that covers the needle clean and dry. Do not get it wet. Do not use scissors or sharp objects near the tube. Keep the tube clamped, unless it is being used. Check your port site every day for signs of infection. Check for: Redness, swelling, or  pain. Fluid or blood. Pus or a bad smell. Protect the skin around the port site. Avoid wearing bra straps that rub or irritate the site. Protect the skin around your port from seat belts. Place a soft pad over your chest if needed. Bathe or shower as told by your health care provider. The site may get wet as long as you are not actively receiving an infusion. Return to your normal activities as told by your health care provider. Ask your health care provider what activities are safe for you. Carry a medical alert card or wear a medical alert bracelet at all times. This will let health care providers know that you have an implanted port in case of an emergency. Get help right away if: You have redness, swelling, or pain at the port site. You have fluid or blood coming from your port site. You have pus or a bad smell coming from the port site. You have a fever. Summary Implanted ports are usually placed in the chest for long-term IV access. Follow instructions from your health care provider about flushing the port and changing bandages (dressings). Take care of the area around your port by avoiding clothing that puts pressure on the area, and by watching for signs of infection. Protect the skin around your port from seat belts. Place a soft pad over your chest if needed. Get help right away if you have a fever or you have redness, swelling, pain, drainage, or a bad smell at the port site. This information is not intended to replace advice given to you by your health care provider. Make sure you discuss any questions you have with your health care provider. Document Revised: 04/07/2020 Document Reviewed: 06/02/2019 Elsevier Patient Education  2022 [employer]  

## 2020-10-28 ENCOUNTER — Inpatient Hospital Stay: Payer: Medicare Other

## 2020-11-01 ENCOUNTER — Encounter: Payer: Self-pay | Admitting: Nurse Practitioner

## 2020-11-01 ENCOUNTER — Other Ambulatory Visit: Payer: Self-pay

## 2020-11-01 ENCOUNTER — Inpatient Hospital Stay: Payer: Medicare Other

## 2020-11-01 ENCOUNTER — Inpatient Hospital Stay (HOSPITAL_BASED_OUTPATIENT_CLINIC_OR_DEPARTMENT_OTHER): Payer: Medicare Other | Admitting: Nurse Practitioner

## 2020-11-01 ENCOUNTER — Inpatient Hospital Stay: Payer: Medicare Other | Attending: Oncology

## 2020-11-01 VITALS — BP 156/91 | HR 88 | Temp 98.1°F | Resp 18 | Ht 63.0 in | Wt 213.4 lb

## 2020-11-01 DIAGNOSIS — E039 Hypothyroidism, unspecified: Secondary | ICD-10-CM | POA: Diagnosis not present

## 2020-11-01 DIAGNOSIS — C787 Secondary malignant neoplasm of liver and intrahepatic bile duct: Secondary | ICD-10-CM | POA: Diagnosis present

## 2020-11-01 DIAGNOSIS — D696 Thrombocytopenia, unspecified: Secondary | ICD-10-CM | POA: Insufficient documentation

## 2020-11-01 DIAGNOSIS — R059 Cough, unspecified: Secondary | ICD-10-CM | POA: Insufficient documentation

## 2020-11-01 DIAGNOSIS — C20 Malignant neoplasm of rectum: Secondary | ICD-10-CM

## 2020-11-01 DIAGNOSIS — Z5111 Encounter for antineoplastic chemotherapy: Secondary | ICD-10-CM | POA: Diagnosis not present

## 2020-11-01 DIAGNOSIS — Z79899 Other long term (current) drug therapy: Secondary | ICD-10-CM | POA: Diagnosis not present

## 2020-11-01 DIAGNOSIS — Z923 Personal history of irradiation: Secondary | ICD-10-CM | POA: Diagnosis not present

## 2020-11-01 DIAGNOSIS — Z9221 Personal history of antineoplastic chemotherapy: Secondary | ICD-10-CM | POA: Insufficient documentation

## 2020-11-01 LAB — CBC WITH DIFFERENTIAL (CANCER CENTER ONLY)
Abs Immature Granulocytes: 0.05 10*3/uL (ref 0.00–0.07)
Basophils Absolute: 0 10*3/uL (ref 0.0–0.1)
Basophils Relative: 1 %
Eosinophils Absolute: 0.1 10*3/uL (ref 0.0–0.5)
Eosinophils Relative: 3 %
HCT: 31.6 % — ABNORMAL LOW (ref 36.0–46.0)
Hemoglobin: 10.7 g/dL — ABNORMAL LOW (ref 12.0–15.0)
Immature Granulocytes: 2 %
Lymphocytes Relative: 24 %
Lymphs Abs: 0.7 10*3/uL (ref 0.7–4.0)
MCH: 32.8 pg (ref 26.0–34.0)
MCHC: 33.9 g/dL (ref 30.0–36.0)
MCV: 96.9 fL (ref 80.0–100.0)
Monocytes Absolute: 0.7 10*3/uL (ref 0.1–1.0)
Monocytes Relative: 26 %
Neutro Abs: 1.2 10*3/uL — ABNORMAL LOW (ref 1.7–7.7)
Neutrophils Relative %: 44 %
Platelet Count: 102 10*3/uL — ABNORMAL LOW (ref 150–400)
RBC: 3.26 MIL/uL — ABNORMAL LOW (ref 3.87–5.11)
RDW: 14.6 % (ref 11.5–15.5)
WBC Count: 2.7 10*3/uL — ABNORMAL LOW (ref 4.0–10.5)
nRBC: 0 % (ref 0.0–0.2)

## 2020-11-01 LAB — CMP (CANCER CENTER ONLY)
ALT: 33 U/L (ref 0–44)
AST: 38 U/L (ref 15–41)
Albumin: 3.4 g/dL — ABNORMAL LOW (ref 3.5–5.0)
Alkaline Phosphatase: 116 U/L (ref 38–126)
Anion gap: 9 (ref 5–15)
BUN: 12 mg/dL (ref 8–23)
CO2: 25 mmol/L (ref 22–32)
Calcium: 8.9 mg/dL (ref 8.9–10.3)
Chloride: 108 mmol/L (ref 98–111)
Creatinine: 0.78 mg/dL (ref 0.44–1.00)
GFR, Estimated: >60 mL/min (ref >60)
Glucose, Bld: 87 mg/dL (ref 70–99)
Potassium: 3.6 mmol/L (ref 3.5–5.1)
Sodium: 142 mmol/L (ref 135–145)
Total Bilirubin: 1 mg/dL (ref 0.3–1.2)
Total Protein: 6.1 g/dL — ABNORMAL LOW (ref 6.5–8.1)

## 2020-11-01 NOTE — Progress Notes (Signed)
[facility] PROGRESS NOTE   Diagnosis: Rectal cancer  INTERVAL HISTORY:   Samantha Lutz returns as scheduled.  She completed cycle 1 FOLFIRI 10/12/2020.  Cycle 2 was held on 10/26/2020 due to neutropenia and thrombocytopenia.  She has mild intermittent nausea.  No diarrhea.  Scalp discomfort coinciding with hair loss.  Mild dyspnea on exertion.  No fever.  No significant cough.  Objective:  Vital signs in last 24 hours:  Blood pressure (!) 156/91, pulse 88, temperature 98.1 F (36.7 C), temperature source Oral, resp. rate 18, height 5' 3" (1.6 m), weight 213 lb 6.4 oz (96.8 kg), SpO2 100 %.    HEENT: No thrush or ulcers. Resp: Lungs clear bilaterally. Cardio: Regular rate and rhythm. GI: Abdomen soft and nontender.  No hepatosplenomegaly. Vascular: No leg edema. Skin: Palms without erythema. Port-A-Cath without erythema.  Lab Results:  Lab Results  Component Value Date   WBC 2.7 (L) 11/01/2020   HGB 10.7 (L) 11/01/2020   HCT 31.6 (L) 11/01/2020   MCV 96.9 11/01/2020   PLT 102 (L) 11/01/2020   NEUTROABS 1.2 (L) 11/01/2020    Imaging:  No results found.  Medications: I have reviewed the patient's current medications.  Assessment/Plan: Rectal cancer Mass at 7 cm from the anal verge on colonoscopy 08/09/2017, biopsy revealed invasive adenocarcinoma Staging CTs 08/17/2017-no evidence of metastatic disease, asymmetric thickening in the mid rectum MR pelvis 09/01/2017, T3N0 lesion beginning at 6.3 cm from the anal sphincter Radiation/Xeloda initiated 09/17/2017, completed 10/25/2017 Low anterior resection/diverting ileostomy 12/21/2017,ypT3,ypN1a tumor.  Lymphovascular invasion present, intact mismatch repair protein expression, treatment effect present (TRS 1).  Mismatch repair protein IHC normal; Foundation 1-KRAS/NRAS wild-type, microsatellite status and tumor mutational burden could not be determined. Cycle 1 adjuvant Xeloda beginning 01/21/2018 Cycle 2  adjuvant Xeloda beginning 02/11/2018 Xeloda discontinued after cycle 2 secondary to patient preference Ileostomy takedown 07/10/2018 CTs 09/07/2018- multiple live small pulmonary nodules concerning for metastatic disease CT chest 12/10/2018-multiple bilateral lung nodules, some have increased in size CT chest 04/08/2019-mild enlargement of bilateral lung nodules Status post SBRT multiple lung nodules 05/06/2019, 05/08/2019, 05/13/2019 CT chest 08/28/2019-improvement and resolution in majority of right-sided pulmonary nodules, a superior segment right lower lobe nodule has increased, no new right-sided nodules.  Progressive enlargement of left-sided pulmonary nodules PET scan 09/09/2019-hypermetabolic right lower lobe nodule, 2 hypermetabolic left upper lobe nodules with an additional 0.7 cm left upper lobe nodule below PET resolution, groundglass opacity in the mid to right lower lobe with associated hypermetabolism consistent with postradiation change, hypermetabolic central segment 4A liver lesion Cycle 1 FOLFOX 10/14/2019 Cycle  2FOLFOX 11/11/2019, 5-FU and oxaliplatin dose reduced secondary to neutropenia and thrombocytopenia following cycle one, G-CSF declined Cycle 3 FOLFOX 11/26/2019 Cycle 4 FOLFOX 12/11/2019, oxaliplatin held due to neutropenia and thrombocytopenia Cycle 5 FOLFOX 12/31/2019 Cycle 6 FOLFOX 01/14/2020 (oxaliplatin held, 5-fluorouracil dose reduced due to mucositis) CT chest 01/27/2020-improvement in left lung nodules, progressive airspace disease with traction bronchiectasis throughout the right lung, previously noted right lung nodules are obscured, mildly enlarged right paratracheal lymph nodes-not hypermetabolic on prior PET, likely reactive Cycle 7 FOLFOX 01/28/2020 Cycle 8 FOLFOX 02/11/2020 (oxaliplatin held due to neutropenia and thrombocytopenia) Cycle 9 FOLFOX 02/25/2020 (oxaliplatin held due to thrombocytopenia) Cycle 10 FOLFOX 03/11/2020 (oxaliplatin held secondary to  neuropathy) Cycle 11 FOLFOX 03/25/2020 (oxaliplatin held due to neuropathy) CT chest 04/05/2020-no new or progressive findings.  Interval evolution of presumed postradiation scarring in the right perihilar lung with decrease in the more diffuse groundglass opacity  seen previously.  No substantial change in left lung nodules. 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  No definite CT findings to suggest recurrent tumor.  Stable small left upper lobe pulmonary nodules.  No new or progressive findings.  Stable small right paratracheal lymph nodes.  Stable area of irregular low-attenuation in hepatic segment 4A, site of known prior hepatic metastatic lesion.  No new or progressive findings. Cycle 18 5-fluorouracil 07/06/2020 Cycle 19 5-fluorouracil 07/20/2020 Cycle 20 5-fluorouracil 08/03/2020 Cycle 21 5-fluorouracil 08/17/2020  Cycle 22 5-fluorouracil 08/31/2020 Cycle 23 5-fluorouracil 09/14/2020 CT chest 09/24/2020-increased number and size of pulmonary nodules bilaterally.  Findings in the right upper lobe are concerning for potential lymphangitic spread of tumor.  Right upper lobe findings could also reflect evolving postradiation change. Cycle 1 FOLFIRI 10/12/2020   2.   Hypothyroid   3.    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  Port-A-Cath placement 09/29/2019, interventional radiology   5.  Neutropenia and thrombocytopenia following cycle 1 FOLFOX-plan chemotherapy dose reductions, she declined G-CSF   6.  Mucositis following cycle 5 FOLFOX, 5-fluorouracil dose reduced with cycle 6 Mucositis following cycle 22 5-fluorouracil-Magic mouthwash added   7.  Right lung  airspace disease/volume loss-likely toxicity from chest radiation, trial of prednisone 01/29/2020; dyspnea and cough improved 02/11/2020 Progressive cough 03/11/2020-prednisone resumed at a dose of 20 mg daily Cough and dyspnea improved 03/25/2020-prednisone taper to 15 mg daily Improved 04/06/2020-prednisone taper to 10 mg daily Stable 04/21/2020-prednisone taper to 5 mg daily      Disposition: Samantha Lutz appears stable.  She has completed 1 cycle of FOLFIRI.  Cycle 2 was held last week due to neutropenia, thrombocytopenia.  We reviewed the CBC from today.  Counts are better, ANC 1.2 and platelets 102,000.  She declines white cell growth factor support.  We discussed proceeding today with dose reductions versus waiting another week to allow further recovery of the blood counts.  She prefers to wait another week.  Chemotherapy will be dose reduced with cycle 2  She will return for lab, follow-up, FOLFIRI in 1 week.  We are available to see her sooner if needed.  She understands to contact the office with fever, chills, other signs of infection, bleeding.  Plan reviewed with Dr. Benay Spice.    Samantha Lutz ANP/GNP-BC   11/01/2020  12:37 PM

## 2020-11-01 NOTE — Patient Instructions (Signed)
Implanted Port Home Guide An implanted port is a device that is placed under the skin. It is usually placed in the chest. The device can be used to give IV medicine, to take blood, or for dialysis. You may have an implanted port if: You need IV medicine that would be irritating to the small veins in your hands or arms. You need IV medicines, such as antibiotics, for a long period of time. You need IV nutrition for a long period of time. You need dialysis. When you have a port, your health care provider can choose to use the port instead of veins in your arms for these procedures. You may have fewer limitations when using a port than you would if you used other types of long-term IVs, and you will likely be able to return to normal activities after your incision heals. An implanted port has two main parts: Reservoir. The reservoir is the part where a needle is inserted to give medicines or draw blood. The reservoir is round. After it is placed, it appears as a small, raised area under your skin. Catheter. The catheter is a thin, flexible tube that connects the reservoir to a vein. Medicine that is inserted into the reservoir goes into the catheter and then into the vein. How is my port accessed? To access your port: A numbing cream may be placed on the skin over the port site. Your health care provider will put on a mask and sterile gloves. The skin over your port will be cleaned carefully with a germ-killing soap and allowed to dry. Your health care provider will gently pinch the port and insert a needle into it. Your health care provider will check for a blood return to make sure the port is in the vein and is not clogged. If your port needs to remain accessed to get medicine continuously (constant infusion), your health care provider will place a clear bandage (dressing) over the needle site. The dressing and needle will need to be changed every week, or as told by your health care provider. What  is flushing? Flushing helps keep the port from getting clogged. Follow instructions from your health care provider about how and when to flush the port. Ports are usually flushed with saline solution or a medicine called heparin. The need for flushing will depend on how the port is used: If the port is only used from time to time to give medicines or draw blood, the port may need to be flushed: Before and after medicines have been given. Before and after blood has been drawn. As part of routine maintenance. Flushing may be recommended every 4-6 weeks. If a constant infusion is running, the port may not need to be flushed. Throw away any syringes in a disposal container that is meant for sharp items (sharps container). You can buy a sharps container from a pharmacy, or you can make one by using an empty hard plastic bottle with a cover. How long will my port stay implanted? The port can stay in for as long as your health care provider thinks it is needed. When it is time for the port to come out, a surgery will be done to remove it. The surgery will be similar to the procedure that was done to put the port in. Follow these instructions at home:  Flush your port as told by your health care provider. If you need an infusion over several days, follow instructions from your health care provider about how   to take care of your port site. Make sure you: Wash your hands with soap and water before you change your dressing. If soap and water are not available, use alcohol-based hand sanitizer. Change your dressing as told by your health care provider. Place any used dressings or infusion bags into a plastic bag. Throw that bag in the trash. Keep the dressing that covers the needle clean and dry. Do not get it wet. Do not use scissors or sharp objects near the tube. Keep the tube clamped, unless it is being used. Check your port site every day for signs of infection. Check for: Redness, swelling, or  pain. Fluid or blood. Pus or a bad smell. Protect the skin around the port site. Avoid wearing bra straps that rub or irritate the site. Protect the skin around your port from seat belts. Place a soft pad over your chest if needed. Bathe or shower as told by your health care provider. The site may get wet as long as you are not actively receiving an infusion. Return to your normal activities as told by your health care provider. Ask your health care provider what activities are safe for you. Carry a medical alert card or wear a medical alert bracelet at all times. This will let health care providers know that you have an implanted port in case of an emergency. Get help right away if: You have redness, swelling, or pain at the port site. You have fluid or blood coming from your port site. You have pus or a bad smell coming from the port site. You have a fever. Summary Implanted ports are usually placed in the chest for long-term IV access. Follow instructions from your health care provider about flushing the port and changing bandages (dressings). Take care of the area around your port by avoiding clothing that puts pressure on the area, and by watching for signs of infection. Protect the skin around your port from seat belts. Place a soft pad over your chest if needed. Get help right away if you have a fever or you have redness, swelling, pain, drainage, or a bad smell at the port site. This information is not intended to replace advice given to you by your health care provider. Make sure you discuss any questions you have with your health care provider. Document Revised: 04/07/2020 Document Reviewed: 06/02/2019 Elsevier Patient Education  2022 [employer]  

## 2020-11-03 ENCOUNTER — Inpatient Hospital Stay: Payer: Medicare Other

## 2020-11-08 ENCOUNTER — Inpatient Hospital Stay: Payer: Medicare Other

## 2020-11-08 ENCOUNTER — Inpatient Hospital Stay (HOSPITAL_BASED_OUTPATIENT_CLINIC_OR_DEPARTMENT_OTHER): Payer: Medicare Other | Admitting: Nurse Practitioner

## 2020-11-08 ENCOUNTER — Other Ambulatory Visit: Payer: Self-pay

## 2020-11-08 ENCOUNTER — Ambulatory Visit: Payer: Medicare Other | Admitting: Nurse Practitioner

## 2020-11-08 ENCOUNTER — Ambulatory Visit: Payer: Medicare Other

## 2020-11-08 ENCOUNTER — Other Ambulatory Visit: Payer: Medicare Other

## 2020-11-08 ENCOUNTER — Encounter: Payer: Self-pay | Admitting: Nurse Practitioner

## 2020-11-08 VITALS — BP 153/76 | HR 86 | Temp 98.1°F | Resp 18 | Ht 63.0 in | Wt 213.8 lb

## 2020-11-08 DIAGNOSIS — E039 Hypothyroidism, unspecified: Secondary | ICD-10-CM

## 2020-11-08 DIAGNOSIS — C20 Malignant neoplasm of rectum: Secondary | ICD-10-CM

## 2020-11-08 DIAGNOSIS — Z5111 Encounter for antineoplastic chemotherapy: Secondary | ICD-10-CM | POA: Diagnosis not present

## 2020-11-08 LAB — CBC WITH DIFFERENTIAL (CANCER CENTER ONLY)
Abs Immature Granulocytes: 0.03 10*3/uL (ref 0.00–0.07)
Basophils Absolute: 0 10*3/uL (ref 0.0–0.1)
Basophils Relative: 1 %
Eosinophils Absolute: 0 10*3/uL (ref 0.0–0.5)
Eosinophils Relative: 1 %
HCT: 32.9 % — ABNORMAL LOW (ref 36.0–46.0)
Hemoglobin: 11.2 g/dL — ABNORMAL LOW (ref 12.0–15.0)
Immature Granulocytes: 1 %
Lymphocytes Relative: 17 %
Lymphs Abs: 0.8 10*3/uL (ref 0.7–4.0)
MCH: 33.6 pg (ref 26.0–34.0)
MCHC: 34 g/dL (ref 30.0–36.0)
MCV: 98.8 fL (ref 80.0–100.0)
Monocytes Absolute: 0.8 10*3/uL (ref 0.1–1.0)
Monocytes Relative: 17 %
Neutro Abs: 2.8 10*3/uL (ref 1.7–7.7)
Neutrophils Relative %: 63 %
Platelet Count: 113 10*3/uL — ABNORMAL LOW (ref 150–400)
RBC: 3.33 MIL/uL — ABNORMAL LOW (ref 3.87–5.11)
RDW: 15.4 % (ref 11.5–15.5)
WBC Count: 4.4 10*3/uL (ref 4.0–10.5)
nRBC: 0 % (ref 0.0–0.2)

## 2020-11-08 LAB — CMP (CANCER CENTER ONLY)
ALT: 31 U/L (ref 0–44)
AST: 43 U/L — ABNORMAL HIGH (ref 15–41)
Albumin: 3.4 g/dL — ABNORMAL LOW (ref 3.5–5.0)
Alkaline Phosphatase: 96 U/L (ref 38–126)
Anion gap: 8 (ref 5–15)
BUN: 14 mg/dL (ref 8–23)
CO2: 25 mmol/L (ref 22–32)
Calcium: 9 mg/dL (ref 8.9–10.3)
Chloride: 109 mmol/L (ref 98–111)
Creatinine: 0.74 mg/dL (ref 0.44–1.00)
GFR, Estimated: >60 mL/min (ref >60)
Glucose, Bld: 93 mg/dL (ref 70–99)
Potassium: 4.1 mmol/L (ref 3.5–5.1)
Sodium: 142 mmol/L (ref 135–145)
Total Bilirubin: 1.1 mg/dL (ref 0.3–1.2)
Total Protein: 6.1 g/dL — ABNORMAL LOW (ref 6.5–8.1)

## 2020-11-08 MED ORDER — SODIUM CHLORIDE 0.9 % IV SOLN
1800.0000 mg/m2 | INTRAVENOUS | Status: DC
  Administered 2020-11-08: 3700 mg via INTRAVENOUS
  Filled 2020-11-08: qty 74

## 2020-11-08 MED ORDER — DEXAMETHASONE SODIUM PHOSPHATE 10 MG/ML IJ SOLN
5.0000 mg | Freq: Once | INTRAMUSCULAR | Status: AC
  Administered 2020-11-08: 5 mg via INTRAVENOUS
  Filled 2020-11-08: qty 1

## 2020-11-08 MED ORDER — LEVOTHYROXINE SODIUM 50 MCG PO TABS: 50.0000 ug | ORAL_TABLET | Freq: Every day | ORAL | 1 refills | Status: DC

## 2020-11-08 MED ORDER — ATROPINE SULFATE 1 MG/ML IJ SOLN
0.5000 mg | Freq: Once | INTRAMUSCULAR | Status: AC | PRN
  Administered 2020-11-08: 0.5 mg via INTRAVENOUS
  Filled 2020-11-08: qty 1

## 2020-11-08 MED ORDER — SODIUM CHLORIDE 0.9 % IV SOLN: 5.0000 mg | Freq: Once | INTRAVENOUS | Status: DC

## 2020-11-08 MED ORDER — PALONOSETRON HCL INJECTION 0.25 MG/5ML
0.2500 mg | Freq: Once | INTRAVENOUS | Status: AC
  Administered 2020-11-08: 0.25 mg via INTRAVENOUS
  Filled 2020-11-08: qty 5

## 2020-11-08 MED ORDER — SODIUM CHLORIDE 0.9 % IV SOLN: Freq: Once | INTRAVENOUS | Status: AC

## 2020-11-08 MED ORDER — FLUOROURACIL CHEMO INJECTION 2.5 GM/50ML
300.0000 mg/m2 | Freq: Once | INTRAVENOUS | Status: AC
  Administered 2020-11-08: 600 mg via INTRAVENOUS
  Filled 2020-11-08: qty 12

## 2020-11-08 MED ORDER — SODIUM CHLORIDE 0.9 % IV SOLN
300.0000 mg/m2 | Freq: Once | INTRAVENOUS | Status: AC
  Administered 2020-11-08: 618 mg via INTRAVENOUS
  Filled 2020-11-08 (×2): qty 30.9

## 2020-11-08 MED ORDER — SODIUM CHLORIDE 0.9 % IV SOLN
135.0000 mg/m2 | Freq: Once | INTRAVENOUS | Status: AC
  Administered 2020-11-08: 280 mg via INTRAVENOUS
  Filled 2020-11-08: qty 14

## 2020-11-08 NOTE — Progress Notes (Signed)
[facility] OFFICE PROGRESS NOTE   Diagnosis: Rectal cancer  INTERVAL HISTORY:   Samantha Lutz returns as scheduled.  Cycle 2 FOLFIRI has been on hold since 10/26/2020 due to neutropenia and thrombocytopenia.  She feels well.  No nausea or vomiting.  No mouth sores.  No diarrhea.  Dyspnea is better.  Objective:  Vital signs in last 24 hours:  Blood pressure (!) 153/76, pulse 86, temperature 98.1 F (36.7 C), temperature source Oral, resp. rate 18, height _0  (1.6 m), weight 213 lb 12.8 oz (97 kg), SpO2 100 %.    HEENT: No thrush or ulcers. Resp: Lungs clear bilaterally. Cardio: Regular rate and rhythm. GI: Abdomen soft and nontender.  No hepatomegaly. Vascular: No leg edema. Skin: Palms without erythema. Port-A-Cath without erythema.   Lab Results:  Lab Results  Component Value Date   WBC 4.4 11/08/2020   HGB 11.2 (L) 11/08/2020   HCT 32.9 (L) 11/08/2020   MCV 98.8 11/08/2020   PLT 113 (L) 11/08/2020   NEUTROABS 2.8 11/08/2020    Imaging:  No results found.  Medications: I have reviewed the patient's current medications.  Assessment/Plan: Rectal cancer Mass at 7 cm from the anal verge on colonoscopy 08/09/2017, biopsy revealed invasive adenocarcinoma Staging CTs 08/17/2017-no evidence of metastatic disease, asymmetric thickening in the mid rectum MR pelvis 09/01/2017, T3N0 lesion beginning at 6.3 cm from the anal sphincter Radiation/Xeloda initiated 09/17/2017, completed 10/25/2017 Low anterior resection/diverting ileostomy 12/21/2017,ypT3,ypN1a tumor.  Lymphovascular invasion present, intact mismatch repair protein expression, treatment effect present (TRS 1).  Mismatch repair protein IHC normal; Foundation 1-KRAS/NRAS wild-type, microsatellite status and tumor mutational burden could not be determined. Cycle 1 adjuvant Xeloda beginning 01/21/2018 Cycle 2 adjuvant Xeloda beginning 02/11/2018 Xeloda discontinued after cycle 2 secondary to patient  preference Ileostomy takedown 07/10/2018 CTs 09/07/2018- multiple live small pulmonary nodules concerning for metastatic disease CT chest 12/10/2018-multiple bilateral lung nodules, some have increased in size CT chest 04/08/2019-mild enlargement of bilateral lung nodules Status post SBRT multiple lung nodules 05/06/2019, 05/08/2019, 05/13/2019 CT chest 08/28/2019-improvement and resolution in majority of right-sided pulmonary nodules, a superior segment right lower lobe nodule has increased, no new right-sided nodules.  Progressive enlargement of left-sided pulmonary nodules PET scan 09/09/2019-hypermetabolic right lower lobe nodule, 2 hypermetabolic left upper lobe nodules with an additional 0.7 cm left upper lobe nodule below PET resolution, groundglass opacity in the mid to right lower lobe with associated hypermetabolism consistent with postradiation change, hypermetabolic central segment 4A liver lesion Cycle 1 FOLFOX 10/14/2019 Cycle  2FOLFOX 11/11/2019, 5-FU and oxaliplatin dose reduced secondary to neutropenia and thrombocytopenia following cycle one, G-CSF declined Cycle 3 FOLFOX 11/26/2019 Cycle 4 FOLFOX 12/11/2019, oxaliplatin held due to neutropenia and thrombocytopenia Cycle 5 FOLFOX 12/31/2019 Cycle 6 FOLFOX 01/14/2020 (oxaliplatin held, 5-fluorouracil dose reduced due to mucositis) CT chest 01/27/2020-improvement in left lung nodules, progressive airspace disease with traction bronchiectasis throughout the right lung, previously noted right lung nodules are obscured, mildly enlarged right paratracheal lymph nodes-not hypermetabolic on prior PET, likely reactive Cycle 7 FOLFOX 01/28/2020 Cycle 8 FOLFOX 02/11/2020 (oxaliplatin held due to neutropenia and thrombocytopenia) Cycle 9 FOLFOX 02/25/2020 (oxaliplatin held due to thrombocytopenia) Cycle 10 FOLFOX 03/11/2020 (oxaliplatin held secondary to neuropathy) Cycle 11 FOLFOX 03/25/2020 (oxaliplatin held due to neuropathy) CT chest 04/05/2020-no new or  progressive findings.  Interval evolution of presumed postradiation scarring in the right perihilar lung with decrease in the more diffuse groundglass opacity seen previously.  No substantial change in left lung nodules. 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  No definite CT findings to suggest recurrent tumor.  Stable small left upper lobe pulmonary nodules.  No new or progressive findings.  Stable small right paratracheal lymph nodes.  Stable area of irregular low-attenuation in hepatic segment 4A, site of known prior hepatic metastatic lesion.  No new or progressive findings. Cycle 18 5-fluorouracil 07/06/2020 Cycle 19 5-fluorouracil 07/20/2020 Cycle 20 5-fluorouracil 08/03/2020 Cycle 21 5-fluorouracil 08/17/2020  Cycle 22 5-fluorouracil 08/31/2020 Cycle 23 5-fluorouracil 09/14/2020 CT chest 09/24/2020-increased number and size of pulmonary nodules bilaterally.  Findings in the right upper lobe are concerning for potential lymphangitic spread of tumor.  Right upper lobe findings could also reflect evolving postradiation change. Cycle 1 FOLFIRI 10/12/2020 Cycle 2 FOLFIRI 11/08/2020, irinotecan dose reduced due to neutropenia and thrombocytopenia following cycle 1   2.   Hypothyroid   3.    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  Port-A-Cath placement 09/29/2019, interventional radiology   5.  Neutropenia and thrombocytopenia following cycle 1 FOLFOX-plan chemotherapy dose reductions, she declined G-CSF   6.  Mucositis following cycle 5 FOLFOX, 5-fluorouracil dose reduced with cycle 6 Mucositis following cycle 22 5-fluorouracil-Magic mouthwash added   7.  Right lung  airspace disease/volume loss-likely toxicity from chest radiation, trial of prednisone 01/29/2020; dyspnea and cough improved 02/11/2020 Progressive cough 03/11/2020-prednisone resumed at a dose of 20 mg daily Cough and dyspnea improved 03/25/2020-prednisone taper to 15 mg daily Improved 04/06/2020-prednisone taper to 10 mg daily Stable 04/21/2020-prednisone taper to 5 mg daily  Disposition: Samantha Lutz appears stable.  She has completed 1 cycle of FOLFIRI.  Cycle 2 has been on hold due to persistent neutropenia and thrombocytopenia.  Review of the CBC from today shows count recovery to an adequate level to proceed with treatment.  Irinotecan will be dose reduced.  Plan to proceed with cycle 2 FOLFIRI today as scheduled.  She will return for lab, follow-up, cycle 3 FOLFIRI in 2 weeks.  She will contact the office in the interim with any problems.  Plan reviewed with Dr. Benay Spice.    Ned Card ANP/GNP-BC   11/08/2020  10:34 AM

## 2020-11-08 NOTE — Patient Instructions (Signed)
[facility]  Discharge Instructions: Thank you for choosing [facility] to provide your oncology and hematology care.   If you have a lab appointment with the [facility], please go directly to the [facility] and check in at the registration area.   Wear comfortable clothing and clothing appropriate for easy access to any Portacath or PICC line.   We strive to give you quality time with your provider. You may need to reschedule your appointment if you arrive late (15 or more minutes).  Arriving late affects you and other patients whose appointments are after yours.  Also, if you miss three or more appointments without notifying the office, you may be dismissed from the clinic at the provider's discretion.      For prescription refill requests, have your pharmacy contact our office and allow 72 hours for refills to be completed.    Today you received the following chemotherapy and/or immunotherapy agents IRINOTECAN , LEUCOVIRN, 5FU      To help prevent nausea and vomiting after your treatment, we encourage you to take your nausea medication as directed.  BELOW ARE SYMPTOMS THAT SHOULD BE REPORTED IMMEDIATELY: *FEVER GREATER THAN 100.4 F (38 C) OR HIGHER *CHILLS OR SWEATING *NAUSEA AND VOMITING THAT IS NOT CONTROLLED WITH YOUR NAUSEA MEDICATION *UNUSUAL SHORTNESS OF BREATH *UNUSUAL BRUISING OR BLEEDING *URINARY PROBLEMS (pain or burning when urinating, or frequent urination) *BOWEL PROBLEMS (unusual diarrhea, constipation, pain near the anus) TENDERNESS IN MOUTH AND THROAT WITH OR WITHOUT PRESENCE OF ULCERS (sore throat, sores in mouth, or a toothache) UNUSUAL RASH, SWELLING OR PAIN  UNUSUAL VAGINAL DISCHARGE OR ITCHING   Items with * indicate a potential emergency and should be followed up as soon as possible or go to the Emergency Department if any problems should occur.  Please show the CHEMOTHERAPY ALERT CARD or IMMUNOTHERAPY ALERT CARD  at check-in to the Emergency Department and triage nurse.  Should you have questions after your visit or need to cancel or reschedule your appointment, please contact [facility]  Dept: [phone]  and follow the prompts.  Office hours are 8:00 a.m. to 4:30 p.m. Monday - Friday. Please note that voicemails left after 4:00 p.m. may not be returned until the following business day.  We are closed weekends and major holidays. You have access to a nurse at all times for urgent questions. Please call the main number to the clinic Dept: [phone] and follow the prompts.   For any non-urgent questions, you may also contact your provider using MyChart. We now offer e-Visits for anyone 84 and older to request care online for non-urgent symptoms. For details visit [email or web address].   Also download the MyChart app! Go to the app store, search "MyChart", open the app, select [facility], and log in with your MyChart username and password.  Due to Covid, a mask is required upon entering the hospital/clinic. If you do not have a mask, one will be given to you upon arrival. For doctor visits, patients may have 1 support person aged 3 or older with them. For treatment visits, patients cannot have anyone with them due to current Covid guidelines and our immunocompromised population.  Irinotecan injection What is this medication? IRINOTECAN (ir in oh TEE kan ) is a chemotherapy drug. It is used to treat colon and rectal cancer. This medicine may be used for other purposes; ask your health care provider or pharmacist if you have questions. COMMON BRAND  NAME(S): Camptosar What should I tell my care team before I take this medication? They need to know if you have any of these conditions: dehydration diarrhea infection (especially a virus infection such as chickenpox, cold sores, or herpes) liver disease low blood counts, like low white cell, platelet, or red cell counts low  levels of calcium, magnesium, or potassium in the blood recent or ongoing radiation therapy an unusual or allergic reaction to irinotecan, other medicines, foods, dyes, or preservatives pregnant or trying to get pregnant breast-feeding How should I use this medication? This drug is given as an infusion into a vein. It is administered in a hospital or clinic by a specially trained health care professional. Talk to your pediatrician regarding the use of this medicine in children. Special care may be needed. Overdosage: If you think you have taken too much of this medicine contact a poison control center or emergency room at once. NOTE: This medicine is only for you. Do not share this medicine with others. What if I miss a dose? It is important not to miss your dose. Call your doctor or health care professional if you are unable to keep an appointment. What may interact with this medication? Do not take this medicine with any of the following medications: cobicistat itraconazole This medicine may interact with the following medications: antiviral medicines for HIV or AIDS certain antibiotics like rifampin or rifabutin certain medicines for fungal infections like ketoconazole, posaconazole, and voriconazole certain medicines for seizures like carbamazepine, phenobarbital, phenotoin clarithromycin gemfibrozil nefazodone [facility]. John's Wort This list may not describe all possible interactions. Give your health care provider a list of all the medicines, herbs, non-prescription drugs, or dietary supplements you use. Also tell them if you smoke, drink alcohol, or use illegal drugs. Some items may interact with your medicine. What should I watch for while using this medication? Your condition will be monitored carefully while you are receiving this medicine. You will need important blood work done while you are taking this medicine. This drug may make you feel generally unwell. This is not uncommon, as  chemotherapy can affect healthy cells as well as cancer cells. Report any side effects. Continue your course of treatment even though you feel ill unless your doctor tells you to stop. In some cases, you may be given additional medicines to help with side effects. Follow all directions for their use. You may get drowsy or dizzy. Do not drive, use machinery, or do anything that needs mental alertness until you know how this medicine affects you. Do not stand or sit up quickly, especially if you are an older patient. This reduces the risk of dizzy or fainting spells. Call your health care professional for advice if you get a fever, chills, or sore throat, or other symptoms of a cold or flu. Do not treat yourself. This medicine decreases your body's ability to fight infections. Try to avoid being around people who are sick. Avoid taking products that contain aspirin, acetaminophen, ibuprofen, naproxen, or ketoprofen unless instructed by your doctor. These medicines may hide a fever. This medicine may increase your risk to bruise or bleed. Call your doctor or health care professional if you notice any unusual bleeding. Be careful brushing and flossing your teeth or using a toothpick because you may get an infection or bleed more easily. If you have any dental work done, tell your dentist you are receiving this medicine. Do not become pregnant while taking this medicine or for 6 months after stopping  it. Women should inform their health care professional if they wish to become pregnant or think they might be pregnant. Men should not father a child while taking this medicine and for 3 months after stopping it. There is potential for serious side effects to an unborn child. Talk to your health care professional for more information. Do not breast-feed an infant while taking this medicine or for 7 days after stopping it. This medicine has caused ovarian failure in some women. This medicine may make it more  difficult to get pregnant. Talk to your health care professional if you are concerned about your fertility. This medicine has caused decreased sperm counts in some men. This may make it more difficult to father a child. Talk to your health care professional if you are concerned about your fertility. What side effects may I notice from receiving this medication? Side effects that you should report to your doctor or health care professional as soon as possible: allergic reactions like skin rash, itching or hives, swelling of the face, lips, or tongue chest pain diarrhea flushing, runny nose, sweating during infusion low blood counts - this medicine may decrease the number of white blood cells, red blood cells and platelets. You may be at increased risk for infections and bleeding. nausea, vomiting pain, swelling, warmth in the leg signs of decreased platelets or bleeding - bruising, pinpoint red spots on the skin, black, tarry stools, blood in the urine signs of infection - fever or chills, cough, sore throat, pain or difficulty passing urine signs of decreased red blood cells - unusually weak or tired, fainting spells, lightheadedness Side effects that usually do not require medical attention (report to your doctor or health care professional if they continue or are bothersome): constipation hair loss headache loss of appetite mouth sores stomach pain This list may not describe all possible side effects. Call your doctor for medical advice about side effects. You may report side effects to FDA at 1-800-FDA-1088. Where should I keep my medication? This drug is given in a hospital or clinic and will not be stored at home. NOTE: This sheet is a summary. It may not cover all possible information. If you have questions about this medicine, talk to your doctor, pharmacist, or health care provider.  2022 Elsevier/Gold Standard (2018-12-17 17:46:13)    Leucovorin injection What is this  medication? LEUCOVORIN (loo koe VOR in) is used to prevent or treat the harmful effects of some medicines. This medicine is used to treat anemia caused by a low amount of folic acid in the body. It is also used with 5-fluorouracil (5-FU) to treat colon cancer. This medicine may be used for other purposes; ask your health care provider or pharmacist if you have questions. What should I tell my care team before I take this medication? They need to know if you have any of these conditions: anemia from low levels of vitamin B-12 in the blood an unusual or allergic reaction to leucovorin, folic acid, other medicines, foods, dyes, or preservatives pregnant or trying to get pregnant breast-feeding How should I use this medication? This medicine is for injection into a muscle or into a vein. It is given by a health care professional in a hospital or clinic setting. Talk to your pediatrician regarding the use of this medicine in children. Special care may be needed. Overdosage: If you think you have taken too much of this medicine contact a poison control center or emergency room at once. NOTE: This medicine is  only for you. Do not share this medicine with others. What if I miss a dose? This does not apply. What may interact with this medication? capecitabine fluorouracil phenobarbital phenytoin primidone trimethoprim-sulfamethoxazole This list may not describe all possible interactions. Give your health care provider a list of all the medicines, herbs, non-prescription drugs, or dietary supplements you use. Also tell them if you smoke, drink alcohol, or use illegal drugs. Some items may interact with your medicine. What should I watch for while using this medication? Your condition will be monitored carefully while you are receiving this medicine. This medicine may increase the side effects of 5-fluorouracil, 5-FU. Tell your doctor or health care professional if you have diarrhea or mouth sores  that do not get better or that get worse. What side effects may I notice from receiving this medication? Side effects that you should report to your doctor or health care professional as soon as possible: allergic reactions like skin rash, itching or hives, swelling of the face, lips, or tongue breathing problems fever, infection mouth sores unusual bleeding or bruising unusually weak or tired Side effects that usually do not require medical attention (report to your doctor or health care professional if they continue or are bothersome): constipation or diarrhea loss of appetite nausea, vomiting This list may not describe all possible side effects. Call your doctor for medical advice about side effects. You may report side effects to FDA at 1-800-FDA-1088. Where should I keep my medication? This drug is given in a hospital or clinic and will not be stored at home. NOTE: This sheet is a summary. It may not cover all possible information. If you have questions about this medicine, talk to your doctor, pharmacist, or health care provider.  2022 Elsevier/Gold Standard (2007-07-23 16:50:29)   Fluorouracil, 5-FU injection What is this medication? FLUOROURACIL, 5-FU (flure oh YOOR a sil) is a chemotherapy drug. It slows the growth of cancer cells. This medicine is used to treat many types of cancer like breast cancer, colon or rectal cancer, pancreatic cancer, and stomach cancer. This medicine may be used for other purposes; ask your health care provider or pharmacist if you have questions. COMMON BRAND NAME(S): Adrucil What should I tell my care team before I take this medication? They need to know if you have any of these conditions: blood disorders dihydropyrimidine dehydrogenase (DPD) deficiency infection (especially a virus infection such as chickenpox, cold sores, or herpes) kidney disease liver disease malnourished, poor nutrition recent or ongoing radiation therapy an unusual or  allergic reaction to fluorouracil, other chemotherapy, other medicines, foods, dyes, or preservatives pregnant or trying to get pregnant breast-feeding How should I use this medication? This drug is given as an infusion or injection into a vein. It is administered in a hospital or clinic by a specially trained health care professional. Talk to your pediatrician regarding the use of this medicine in children. Special care may be needed. Overdosage: If you think you have taken too much of this medicine contact a poison control center or emergency room at once. NOTE: This medicine is only for you. Do not share this medicine with others. What if I miss a dose? It is important not to miss your dose. Call your doctor or health care professional if you are unable to keep an appointment. What may interact with this medication? Do not take this medicine with any of the following medications: live virus vaccines This medicine may also interact with the following medications: medicines that treat or  prevent blood clots like warfarin, enoxaparin, and dalteparin This list may not describe all possible interactions. Give your health care provider a list of all the medicines, herbs, non-prescription drugs, or dietary supplements you use. Also tell them if you smoke, drink alcohol, or use illegal drugs. Some items may interact with your medicine. What should I watch for while using this medication? Visit your doctor for checks on your progress. This drug may make you feel generally unwell. This is not uncommon, as chemotherapy can affect healthy cells as well as cancer cells. Report any side effects. Continue your course of treatment even though you feel ill unless your doctor tells you to stop. In some cases, you may be given additional medicines to help with side effects. Follow all directions for their use. Call your doctor or health care professional for advice if you get a fever, chills or sore throat, or  other symptoms of a cold or flu. Do not treat yourself. This drug decreases your body's ability to fight infections. Try to avoid being around people who are sick. This medicine may increase your risk to bruise or bleed. Call your doctor or health care professional if you notice any unusual bleeding. Be careful brushing and flossing your teeth or using a toothpick because you may get an infection or bleed more easily. If you have any dental work done, tell your dentist you are receiving this medicine. Avoid taking products that contain aspirin, acetaminophen, ibuprofen, naproxen, or ketoprofen unless instructed by your doctor. These medicines may hide a fever. Do not become pregnant while taking this medicine. Women should inform their doctor if they wish to become pregnant or think they might be pregnant. There is a potential for serious side effects to an unborn child. Talk to your health care professional or pharmacist for more information. Do not breast-feed an infant while taking this medicine. Men should inform their doctor if they wish to father a child. This medicine may lower sperm counts. Do not treat diarrhea with over the counter products. Contact your doctor if you have diarrhea that lasts more than 2 days or if it is severe and watery. This medicine can make you more sensitive to the sun. Keep out of the sun. If you cannot avoid being in the sun, wear protective clothing and use sunscreen. Do not use sun lamps or tanning beds/booths. What side effects may I notice from receiving this medication? Side effects that you should report to your doctor or health care professional as soon as possible: allergic reactions like skin rash, itching or hives, swelling of the face, lips, or tongue low blood counts - this medicine may decrease the number of white blood cells, red blood cells and platelets. You may be at increased risk for infections and bleeding. signs of infection - fever or chills,  cough, sore throat, pain or difficulty passing urine signs of decreased platelets or bleeding - bruising, pinpoint red spots on the skin, black, tarry stools, blood in the urine signs of decreased red blood cells - unusually weak or tired, fainting spells, lightheadedness breathing problems changes in vision chest pain mouth sores nausea and vomiting pain, swelling, redness at site where injected pain, tingling, numbness in the hands or feet redness, swelling, or sores on hands or feet stomach pain unusual bleeding Side effects that usually do not require medical attention (report to your doctor or health care professional if they continue or are bothersome): changes in finger or toe nails diarrhea dry or itchy  skin hair loss headache loss of appetite sensitivity of eyes to the light stomach upset unusually teary eyes This list may not describe all possible side effects. Call your doctor for medical advice about side effects. You may report side effects to FDA at 1-800-FDA-1088. Where should I keep my medication? This drug is given in a hospital or clinic and will not be stored at home. NOTE: This sheet is a summary. It may not cover all possible information. If you have questions about this medicine, talk to your doctor, pharmacist, or health care provider.  2022 Elsevier/Gold Standard (2018-12-17 15:00:03)

## 2020-11-10 ENCOUNTER — Other Ambulatory Visit: Payer: Self-pay

## 2020-11-10 ENCOUNTER — Inpatient Hospital Stay: Payer: Medicare Other

## 2020-11-10 VITALS — BP 135/56 | HR 83 | Temp 98.1°F | Resp 20

## 2020-11-10 DIAGNOSIS — Z5111 Encounter for antineoplastic chemotherapy: Secondary | ICD-10-CM | POA: Diagnosis not present

## 2020-11-10 DIAGNOSIS — C20 Malignant neoplasm of rectum: Secondary | ICD-10-CM

## 2020-11-10 MED ORDER — SODIUM CHLORIDE 0.9% FLUSH
10.0000 mL | INTRAVENOUS | Status: DC | PRN
  Administered 2020-11-10: 10 mL

## 2020-11-10 MED ORDER — HEPARIN SOD (PORK) LOCK FLUSH 100 UNIT/ML IV SOLN
500.0000 [IU] | Freq: Once | INTRAVENOUS | Status: AC | PRN
  Administered 2020-11-10: 500 [IU]

## 2020-11-21 ENCOUNTER — Other Ambulatory Visit: Payer: Self-pay | Admitting: Oncology

## 2020-11-23 ENCOUNTER — Inpatient Hospital Stay: Payer: Medicare Other

## 2020-11-23 ENCOUNTER — Other Ambulatory Visit: Payer: Self-pay

## 2020-11-23 ENCOUNTER — Inpatient Hospital Stay (HOSPITAL_BASED_OUTPATIENT_CLINIC_OR_DEPARTMENT_OTHER): Payer: Medicare Other | Admitting: Oncology

## 2020-11-23 VITALS — BP 133/70 | HR 91 | Temp 97.8°F | Resp 18 | Ht 63.0 in | Wt 210.6 lb

## 2020-11-23 DIAGNOSIS — E039 Hypothyroidism, unspecified: Secondary | ICD-10-CM

## 2020-11-23 DIAGNOSIS — C20 Malignant neoplasm of rectum: Secondary | ICD-10-CM | POA: Diagnosis not present

## 2020-11-23 DIAGNOSIS — Z95828 Presence of other vascular implants and grafts: Secondary | ICD-10-CM

## 2020-11-23 DIAGNOSIS — Z5111 Encounter for antineoplastic chemotherapy: Secondary | ICD-10-CM | POA: Diagnosis not present

## 2020-11-23 LAB — CMP (CANCER CENTER ONLY)
ALT: 36 U/L (ref 0–44)
AST: 40 U/L (ref 15–41)
Albumin: 3.4 g/dL — ABNORMAL LOW (ref 3.5–5.0)
Alkaline Phosphatase: 106 U/L (ref 38–126)
Anion gap: 7 (ref 5–15)
BUN: 21 mg/dL (ref 8–23)
CO2: 24 mmol/L (ref 22–32)
Calcium: 8.6 mg/dL — ABNORMAL LOW (ref 8.9–10.3)
Chloride: 106 mmol/L (ref 98–111)
Creatinine: 0.78 mg/dL (ref 0.44–1.00)
GFR, Estimated: >60 mL/min (ref >60)
Glucose, Bld: 92 mg/dL (ref 70–99)
Potassium: 3.8 mmol/L (ref 3.5–5.1)
Sodium: 137 mmol/L (ref 135–145)
Total Bilirubin: 0.9 mg/dL (ref 0.3–1.2)
Total Protein: 6.1 g/dL — ABNORMAL LOW (ref 6.5–8.1)

## 2020-11-23 LAB — CBC WITH DIFFERENTIAL (CANCER CENTER ONLY)
Abs Immature Granulocytes: 0 10*3/uL (ref 0.00–0.07)
Basophils Absolute: 0 10*3/uL (ref 0.0–0.1)
Basophils Relative: 0 %
Eosinophils Absolute: 0.2 10*3/uL (ref 0.0–0.5)
Eosinophils Relative: 9 %
HCT: 32 % — ABNORMAL LOW (ref 36.0–46.0)
Hemoglobin: 11 g/dL — ABNORMAL LOW (ref 12.0–15.0)
Immature Granulocytes: 0 %
Lymphocytes Relative: 29 %
Lymphs Abs: 0.7 10*3/uL (ref 0.7–4.0)
MCH: 33.7 pg (ref 26.0–34.0)
MCHC: 34.4 g/dL (ref 30.0–36.0)
MCV: 98.2 fL (ref 80.0–100.0)
Monocytes Absolute: 0.5 10*3/uL (ref 0.1–1.0)
Monocytes Relative: 21 %
Neutro Abs: 1 10*3/uL — ABNORMAL LOW (ref 1.7–7.7)
Neutrophils Relative %: 41 %
Platelet Count: 106 10*3/uL — ABNORMAL LOW (ref 150–400)
RBC: 3.26 MIL/uL — ABNORMAL LOW (ref 3.87–5.11)
RDW: 15.7 % — ABNORMAL HIGH (ref 11.5–15.5)
WBC Count: 2.4 10*3/uL — ABNORMAL LOW (ref 4.0–10.5)
nRBC: 0 % (ref 0.0–0.2)

## 2020-11-23 LAB — TSH: TSH: 5.231 u[IU]/mL — ABNORMAL HIGH (ref 0.350–4.500)

## 2020-11-23 MED ORDER — SODIUM CHLORIDE 0.9% FLUSH
10.0000 mL | Freq: Once | INTRAVENOUS | Status: AC
  Administered 2020-11-23: 10 mL

## 2020-11-23 MED ORDER — HEPARIN SOD (PORK) LOCK FLUSH 100 UNIT/ML IV SOLN
500.0000 [IU] | Freq: Once | INTRAVENOUS | Status: AC
  Administered 2020-11-23: 500 [IU]

## 2020-11-23 NOTE — Progress Notes (Signed)
[facility] OFFICE PROGRESS NOTE   Diagnosis: Rectal Lutz  INTERVAL HISTORY:   Samantha Lutz completed on cycle FOLFIRI on 11/08/2020.  No nausea/vomiting, mouth sores, or diarrhea.  She reports malaise following chemotherapy.  Stable exertional dyspnea.  Objective:  Vital signs in last 24 hours:  Blood pressure 133/70, pulse 91, temperature 97.8 F (36.6 C), temperature source Oral, resp. rate 18, height '5\' 3"'  (1.6 m), weight 210 lb 9.6 oz (95.5 kg), SpO2 100 %.    HEENT: No thrush or ulcers Resp: Lungs with decreased breath sounds at the right lower posterior chest, no respiratory distress Cardio: Regular rate and rhythm GI: No hepatosplenomegaly Vascular: No leg edema    Portacath/PICC-without erythema  Lab Results:  Lab Results  Component Value Date   WBC 2.4 (L) 11/23/2020   HGB 11.0 (L) 11/23/2020   HCT 32.0 (L) 11/23/2020   MCV 98.2 11/23/2020   PLT 106 (L) 11/23/2020   NEUTROABS 1.0 (L) 11/23/2020    CMP  Lab Results  Component Value Date   NA 137 11/23/2020   K 3.8 11/23/2020   CL 106 11/23/2020   CO2 24 11/23/2020   GLUCOSE 92 11/23/2020   BUN 21 11/23/2020   CREATININE 0.78 11/23/2020   CALCIUM 8.6 (L) 11/23/2020   PROT 6.1 (L) 11/23/2020   ALBUMIN 3.4 (L) 11/23/2020   AST 40 11/23/2020   ALT 36 11/23/2020   ALKPHOS 106 11/23/2020   BILITOT 0.9 11/23/2020   GFRNONAA >60 11/23/2020   GFRAA >60 10/28/2019    Lab Results  Component Value Date   CEA1 <1.00 06/01/2020   CEA <1.00 06/01/2020     Medications: I have reviewed the patient's current medications.   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  Lymphovascular invasion present, intact mismatch repair protein expression, treatment effect present (TRS 1).  Mismatch repair protein IHC normal; Foundation 1-KRAS/NRAS wild-type, microsatellite status and tumor mutational burden could not be determined. Cycle 1 adjuvant Xeloda beginning 01/21/2018 Cycle 2 adjuvant Xeloda beginning 02/11/2018 Xeloda discontinued after cycle 2 secondary to patient preference Ileostomy takedown 07/10/2018 CTs 09/07/2018- multiple live small pulmonary nodules concerning for metastatic disease CT chest 12/10/2018-multiple bilateral lung nodules, some have increased in size CT chest 04/08/2019-mild enlargement of bilateral lung nodules Status post SBRT multiple lung nodules 05/06/2019, 05/08/2019, 05/13/2019 CT chest 08/28/2019-improvement and resolution in majority of right-sided pulmonary nodules, a superior segment right lower lobe nodule has increased, no new right-sided nodules.  Progressive enlargement of left-sided pulmonary nodules PET scan 09/09/2019-hypermetabolic right lower lobe nodule, 2 hypermetabolic left upper lobe nodules with an additional 0.7 cm left upper lobe nodule below PET resolution, groundglass opacity in the mid to right lower lobe with associated hypermetabolism consistent with postradiation change, hypermetabolic central segment 4A liver lesion Cycle 1 FOLFOX 10/14/2019 Cycle  2FOLFOX 11/11/2019, 5-FU and oxaliplatin dose reduced secondary to neutropenia and thrombocytopenia following cycle one, G-CSF declined Cycle 3 FOLFOX 11/26/2019 Cycle 4 FOLFOX 12/11/2019, oxaliplatin held due to neutropenia and thrombocytopenia Cycle 5 FOLFOX 12/31/2019 Cycle 6 FOLFOX 01/14/2020 (oxaliplatin held, 5-fluorouracil dose reduced due to mucositis) CT chest 01/27/2020-improvement in left lung nodules, progressive airspace disease with traction bronchiectasis throughout the right lung, previously noted  right  lung nodules are obscured, mildly enlarged right paratracheal lymph nodes-not hypermetabolic on prior PET, likely reactive Cycle 7 FOLFOX 01/28/2020 Cycle 8 FOLFOX 02/11/2020 (oxaliplatin held due to neutropenia and thrombocytopenia) Cycle 9 FOLFOX 02/25/2020 (oxaliplatin held due to thrombocytopenia) Cycle 10 FOLFOX 03/11/2020 (oxaliplatin held secondary to neuropathy) Cycle 11 FOLFOX 03/25/2020 (oxaliplatin held due to neuropathy) CT chest 04/05/2020-no new or progressive findings.  Interval evolution of presumed postradiation scarring in the right perihilar lung with decrease in the more diffuse groundglass opacity seen previously.  No substantial change in left lung nodules. 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  No definite CT findings to suggest recurrent tumor.  Stable small left upper lobe pulmonary nodules.  No new or progressive findings.  Stable small right paratracheal lymph nodes.  Stable area of irregular low-attenuation in hepatic segment 4A, site of known prior hepatic metastatic lesion.  No new or progressive findings. Cycle 18 5-fluorouracil 07/06/2020 Cycle 19 5-fluorouracil 07/20/2020 Cycle 20 5-fluorouracil 08/03/2020 Cycle 21 5-fluorouracil 08/17/2020  Cycle 22 5-fluorouracil 08/31/2020 Cycle 23 5-fluorouracil 09/14/2020 CT chest 09/24/2020-increased number and size of pulmonary nodules bilaterally.  Findings in the right upper lobe are concerning for potential lymphangitic spread of tumor.  Right upper lobe findings could also reflect evolving postradiation change. Cycle 1 FOLFIRI 10/12/2020 Cycle 2 FOLFIRI 11/08/2020, irinotecan dose reduced due to neutropenia and thrombocytopenia  following cycle 1   2.   Hypothyroid   3.    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  Port-A-Cath placement 09/29/2019, interventional radiology   5.  Neutropenia and thrombocytopenia following cycle 1 FOLFOX-plan chemotherapy dose reductions, she declined G-CSF   6.  Mucositis following cycle 5 FOLFOX, 5-fluorouracil dose reduced with cycle 6 Mucositis following cycle 22 5-fluorouracil-Magic mouthwash added   7.  Right lung airspace disease/volume loss-likely toxicity from chest radiation, trial of prednisone 01/29/2020; dyspnea and cough improved 02/11/2020 Progressive cough 03/11/2020-prednisone resumed at a dose of 20 mg daily Cough and dyspnea improved 03/25/2020-prednisone taper to 15 mg daily Improved 04/06/2020-prednisone taper to 10 mg daily Stable 04/21/2020-prednisone taper to 5 mg daily    Disposition: Samantha Lutz appears unchanged.  She has mild neutropenia today.  We discussed proceeding with chemotherapy and G-CSF support.  She does not wish to receive G-CSF.  Chemotherapy will be delayed for 1 week.  The plan is to continue chemotherapy at the current doses and change to a 3-week schedule.  She will return for chemotherapy in 1 week.  She will be scheduled for an office visit and chemotherapy in 4 weeks.  She will be referred for a restaging CT after cycle 4 or cycle 5 FOLFIRI.  Betsy Coder, MD  11/23/2020  9:42 AM

## 2020-11-23 NOTE — Addendum Note (Signed)
Addended by: Tedd Sias on: 11/23/2020 10:03 AM   Modules accepted: Orders

## 2020-11-25 ENCOUNTER — Inpatient Hospital Stay: Payer: Medicare Other

## 2020-12-01 ENCOUNTER — Telehealth: Payer: Self-pay

## 2020-12-01 ENCOUNTER — Inpatient Hospital Stay: Payer: Medicare Other

## 2020-12-01 ENCOUNTER — Other Ambulatory Visit: Payer: Self-pay

## 2020-12-01 ENCOUNTER — Inpatient Hospital Stay: Payer: Medicare Other | Attending: Oncology

## 2020-12-01 VITALS — BP 120/69 | HR 80 | Temp 98.2°F | Wt 212.0 lb

## 2020-12-01 DIAGNOSIS — C20 Malignant neoplasm of rectum: Secondary | ICD-10-CM

## 2020-12-01 DIAGNOSIS — Z5111 Encounter for antineoplastic chemotherapy: Secondary | ICD-10-CM | POA: Insufficient documentation

## 2020-12-01 LAB — CBC WITH DIFFERENTIAL (CANCER CENTER ONLY)
Abs Immature Granulocytes: 0.05 10*3/uL (ref 0.00–0.07)
Basophils Absolute: 0 10*3/uL (ref 0.0–0.1)
Basophils Relative: 1 %
Eosinophils Absolute: 0.1 10*3/uL (ref 0.0–0.5)
Eosinophils Relative: 2 %
HCT: 32 % — ABNORMAL LOW (ref 36.0–46.0)
Hemoglobin: 10.9 g/dL — ABNORMAL LOW (ref 12.0–15.0)
Immature Granulocytes: 1 %
Lymphocytes Relative: 19 %
Lymphs Abs: 0.8 10*3/uL (ref 0.7–4.0)
MCH: 34.1 pg — ABNORMAL HIGH (ref 26.0–34.0)
MCHC: 34.1 g/dL (ref 30.0–36.0)
MCV: 100 fL (ref 80.0–100.0)
Monocytes Absolute: 0.8 10*3/uL (ref 0.1–1.0)
Monocytes Relative: 19 %
Neutro Abs: 2.4 10*3/uL (ref 1.7–7.7)
Neutrophils Relative %: 58 %
Platelet Count: 98 10*3/uL — ABNORMAL LOW (ref 150–400)
RBC: 3.2 MIL/uL — ABNORMAL LOW (ref 3.87–5.11)
RDW: 16.4 % — ABNORMAL HIGH (ref 11.5–15.5)
WBC Count: 4.1 10*3/uL (ref 4.0–10.5)
nRBC: 0 % (ref 0.0–0.2)

## 2020-12-01 LAB — CMP (CANCER CENTER ONLY)
ALT: 36 U/L (ref 0–44)
AST: 41 U/L (ref 15–41)
Albumin: 3.2 g/dL — ABNORMAL LOW (ref 3.5–5.0)
Alkaline Phosphatase: 89 U/L (ref 38–126)
Anion gap: 9 (ref 5–15)
BUN: 19 mg/dL (ref 8–23)
CO2: 23 mmol/L (ref 22–32)
Calcium: 8.5 mg/dL — ABNORMAL LOW (ref 8.9–10.3)
Chloride: 111 mmol/L (ref 98–111)
Creatinine: 0.72 mg/dL (ref 0.44–1.00)
GFR, Estimated: >60 mL/min (ref >60)
Glucose, Bld: 86 mg/dL (ref 70–99)
Potassium: 3.7 mmol/L (ref 3.5–5.1)
Sodium: 143 mmol/L (ref 135–145)
Total Bilirubin: 0.9 mg/dL (ref 0.3–1.2)
Total Protein: 6 g/dL — ABNORMAL LOW (ref 6.5–8.1)

## 2020-12-01 MED ORDER — PALONOSETRON HCL INJECTION 0.25 MG/5ML
0.2500 mg | Freq: Once | INTRAVENOUS | Status: AC
  Administered 2020-12-01: 0.25 mg via INTRAVENOUS
  Filled 2020-12-01: qty 5

## 2020-12-01 MED ORDER — SODIUM CHLORIDE 0.9 % IV SOLN: Freq: Once | INTRAVENOUS | Status: AC

## 2020-12-01 MED ORDER — ATROPINE SULFATE 1 MG/ML IV SOLN
0.5000 mg | Freq: Once | INTRAVENOUS | Status: AC | PRN
  Administered 2020-12-01: 0.5 mg via INTRAVENOUS
  Filled 2020-12-01: qty 1

## 2020-12-01 MED ORDER — DEXAMETHASONE SODIUM PHOSPHATE 10 MG/ML IJ SOLN
5.0000 mg | Freq: Once | INTRAMUSCULAR | Status: AC
  Administered 2020-12-01: 5 mg via INTRAVENOUS
  Filled 2020-12-01: qty 1

## 2020-12-01 MED ORDER — SODIUM CHLORIDE 0.9 % IV SOLN
300.0000 mg/m2 | Freq: Once | INTRAVENOUS | Status: AC
  Administered 2020-12-01: 618 mg via INTRAVENOUS
  Filled 2020-12-01: qty 30.9

## 2020-12-01 MED ORDER — SODIUM CHLORIDE 0.9 % IV SOLN
135.0000 mg/m2 | Freq: Once | INTRAVENOUS | Status: AC
  Administered 2020-12-01: 280 mg via INTRAVENOUS
  Filled 2020-12-01: qty 14

## 2020-12-01 MED ORDER — FLUOROURACIL CHEMO INJECTION 2.5 GM/50ML
300.0000 mg/m2 | Freq: Once | INTRAVENOUS | Status: AC
  Administered 2020-12-01: 600 mg via INTRAVENOUS
  Filled 2020-12-01: qty 12

## 2020-12-01 MED ORDER — SODIUM CHLORIDE 0.9 % IV SOLN
1800.0000 mg/m2 | INTRAVENOUS | Status: DC
  Administered 2020-12-01: 3700 mg via INTRAVENOUS
  Filled 2020-12-01: qty 74

## 2020-12-01 NOTE — Patient Instructions (Signed)
Implanted Port Home Guide An implanted port is a device that is placed under the skin. It is usually placed in the chest. The device can be used to give IV medicine, to take blood, or for dialysis. You may have an implanted port if: You need IV medicine that would be irritating to the small veins in your hands or arms. You need IV medicines, such as antibiotics, for a long period of time. You need IV nutrition for a long period of time. You need dialysis. When you have a port, your health care provider can choose to use the port instead of veins in your arms for these procedures. You may have fewer limitations when using a port than you would if you used other types of long-term IVs, and you will likely be able to return to normal activities after your incision heals. An implanted port has two main parts: Reservoir. The reservoir is the part where a needle is inserted to give medicines or draw blood. The reservoir is round. After it is placed, it appears as a small, raised area under your skin. Catheter. The catheter is a thin, flexible tube that connects the reservoir to a vein. Medicine that is inserted into the reservoir goes into the catheter and then into the vein. How is my port accessed? To access your port: A numbing cream may be placed on the skin over the port site. Your health care provider will put on a mask and sterile gloves. The skin over your port will be cleaned carefully with a germ-killing soap and allowed to dry. Your health care provider will gently pinch the port and insert a needle into it. Your health care provider will check for a blood return to make sure the port is in the vein and is not clogged. If your port needs to remain accessed to get medicine continuously (constant infusion), your health care provider will place a clear bandage (dressing) over the needle site. The dressing and needle will need to be changed every week, or as told by your health care provider. What  is flushing? Flushing helps keep the port from getting clogged. Follow instructions from your health care provider about how and when to flush the port. Ports are usually flushed with saline solution or a medicine called heparin. The need for flushing will depend on how the port is used: If the port is only used from time to time to give medicines or draw blood, the port may need to be flushed: Before and after medicines have been given. Before and after blood has been drawn. As part of routine maintenance. Flushing may be recommended every 4-6 weeks. If a constant infusion is running, the port may not need to be flushed. Throw away any syringes in a disposal container that is meant for sharp items (sharps container). You can buy a sharps container from a pharmacy, or you can make one by using an empty hard plastic bottle with a cover. How long will my port stay implanted? The port can stay in for as long as your health care provider thinks it is needed. When it is time for the port to come out, a surgery will be done to remove it. The surgery will be similar to the procedure that was done to put the port in. Follow these instructions at home:  Flush your port as told by your health care provider. If you need an infusion over several days, follow instructions from your health care provider about how   to take care of your port site. Make sure you: Wash your hands with soap and water before you change your dressing. If soap and water are not available, use alcohol-based hand sanitizer. Change your dressing as told by your health care provider. Place any used dressings or infusion bags into a plastic bag. Throw that bag in the trash. Keep the dressing that covers the needle clean and dry. Do not get it wet. Do not use scissors or sharp objects near the tube. Keep the tube clamped, unless it is being used. Check your port site every day for signs of infection. Check for: Redness, swelling, or  pain. Fluid or blood. Pus or a bad smell. Protect the skin around the port site. Avoid wearing bra straps that rub or irritate the site. Protect the skin around your port from seat belts. Place a soft pad over your chest if needed. Bathe or shower as told by your health care provider. The site may get wet as long as you are not actively receiving an infusion. Return to your normal activities as told by your health care provider. Ask your health care provider what activities are safe for you. Carry a medical alert card or wear a medical alert bracelet at all times. This will let health care providers know that you have an implanted port in case of an emergency. Get help right away if: You have redness, swelling, or pain at the port site. You have fluid or blood coming from your port site. You have pus or a bad smell coming from the port site. You have a fever. Summary Implanted ports are usually placed in the chest for long-term IV access. Follow instructions from your health care provider about flushing the port and changing bandages (dressings). Take care of the area around your port by avoiding clothing that puts pressure on the area, and by watching for signs of infection. Protect the skin around your port from seat belts. Place a soft pad over your chest if needed. Get help right away if you have a fever or you have redness, swelling, pain, drainage, or a bad smell at the port site. This information is not intended to replace advice given to you by your health care provider. Make sure you discuss any questions you have with your health care provider. Document Revised: 04/07/2020 Document Reviewed: 06/02/2019 Elsevier Patient Education  2022 [employer]  

## 2020-12-01 NOTE — Patient Instructions (Addendum)
The chemotherapy medication bag should finish at 46 hours, 96 hours, or 7 days. For example, if your pump is scheduled for 46 hours and it was put on at 4:00 p.m., it should finish at 2:00 p.m. the day it is scheduled to come off regardless of your appointment time.     Estimated time to finish at Friday, December 03, 2020 at 1045   If the display on your pump reads "Low Volume" and it is beeping, take the batteries out of the pump and come to the cancer center for it to be taken off.   If the pump alarms go off prior to the pump reading "Low Volume" then call [phone] and someone can assist you.  If the plunger comes out and the chemotherapy medication is leaking out, please use your home chemo spill kit to clean up the spill. Do NOT use paper towels or other household products.  If you have problems or questions regarding your pump, please call either [phone] (24 hours a day) or the cancer center Monday-Friday 8:00 a.m.- 4:30 p.m. at the clinic number and we will assist you. If you are unable to get assistance, then go to the nearest Emergency Department and ask the staff to contact the IV team for assistance.  [facility]   Discharge Instructions: Thank you for choosing [facility] to provide your oncology and hematology care.   If you have a lab appointment with the [facility], please go directly to the [facility] and check in at the registration area.   Wear comfortable clothing and clothing appropriate for easy access to any Portacath or PICC line.   We strive to give you quality time with your provider. You may need to reschedule your appointment if you arrive late (15 or more minutes).  Arriving late affects you and other patients whose appointments are after yours.  Also, if you miss three or more appointments without notifying the office, you may be dismissed from the clinic at the provider's discretion.      For prescription  refill requests, have your pharmacy contact our office and allow 72 hours for refills to be completed.    Today you received the following chemotherapy and/or immunotherapy agents irinotecan, leucovorin, fluorouracil      To help prevent nausea and vomiting after your treatment, we encourage you to take your nausea medication as directed.  BELOW ARE SYMPTOMS THAT SHOULD BE REPORTED IMMEDIATELY: *FEVER GREATER THAN 100.4 F (38 C) OR HIGHER *CHILLS OR SWEATING *NAUSEA AND VOMITING THAT IS NOT CONTROLLED WITH YOUR NAUSEA MEDICATION *UNUSUAL SHORTNESS OF BREATH *UNUSUAL BRUISING OR BLEEDING *URINARY PROBLEMS (pain or burning when urinating, or frequent urination) *BOWEL PROBLEMS (unusual diarrhea, constipation, pain near the anus) TENDERNESS IN MOUTH AND THROAT WITH OR WITHOUT PRESENCE OF ULCERS (sore throat, sores in mouth, or a toothache) UNUSUAL RASH, SWELLING OR PAIN  UNUSUAL VAGINAL DISCHARGE OR ITCHING   Items with * indicate a potential emergency and should be followed up as soon as possible or go to the Emergency Department if any problems should occur.  Please show the CHEMOTHERAPY ALERT CARD or IMMUNOTHERAPY ALERT CARD at check-in to the Emergency Department and triage nurse.  Should you have questions after your visit or need to cancel or reschedule your appointment, please contact [facility]  Dept: [phone]  and follow the prompts.  Office hours are 8:00 a.m. to 4:30 p.m. Monday - Friday. Please note that  voicemails left after 4:00 p.m. may not be returned until the following business day.  We are closed weekends and major holidays. You have access to a nurse at all times for urgent questions. Please call the main number to the clinic Dept: [phone] and follow the prompts.   For any non-urgent questions, you may also contact your provider using MyChart. We now offer e-Visits for anyone 62 and older to request care online for non-urgent symptoms.  For details visit [email or web address].   Also download the MyChart app! Go to the app store, search "MyChart", open the app, select [facility], and log in with your MyChart username and password.  Due to Covid, a mask is required upon entering the hospital/clinic. If you do not have a mask, one will be given to you upon arrival. For doctor visits, patients may have 1 support person aged 51 or older with them. For treatment visits, patients cannot have anyone with them due to current Covid guidelines and our immunocompromised population.   Irinotecan injection What is this medication? IRINOTECAN (ir in oh TEE kan ) is a chemotherapy drug. It is used to treat colon and rectal cancer. This medicine may be used for other purposes; ask your health care provider or pharmacist if you have questions. COMMON BRAND NAME(S): Camptosar What should I tell my care team before I take this medication? They need to know if you have any of these conditions: dehydration diarrhea infection (especially a virus infection such as chickenpox, cold sores, or herpes) liver disease low blood counts, like low white cell, platelet, or red cell counts low levels of calcium, magnesium, or potassium in the blood recent or ongoing radiation therapy an unusual or allergic reaction to irinotecan, other medicines, foods, dyes, or preservatives pregnant or trying to get pregnant breast-feeding How should I use this medication? This drug is given as an infusion into a vein. It is administered in a hospital or clinic by a specially trained health care professional. Talk to your pediatrician regarding the use of this medicine in children. Special care may be needed. Overdosage: If you think you have taken too much of this medicine contact a poison control center or emergency room at once. NOTE: This medicine is only for you. Do not share this medicine with others. What if I miss a dose? It is important not to miss your  dose. Call your doctor or health care professional if you are unable to keep an appointment. What may interact with this medication? Do not take this medicine with any of the following medications: cobicistat itraconazole This medicine may interact with the following medications: antiviral medicines for HIV or AIDS certain antibiotics like rifampin or rifabutin certain medicines for fungal infections like ketoconazole, posaconazole, and voriconazole certain medicines for seizures like carbamazepine, phenobarbital, phenotoin clarithromycin gemfibrozil nefazodone [facility]'s Wort This list may not describe all possible interactions. Give your health care provider a list of all the medicines, herbs, non-prescription drugs, or dietary supplements you use. Also tell them if you smoke, drink alcohol, or use illegal drugs. Some items may interact with your medicine. What should I watch for while using this medication? Your condition will be monitored carefully while you are receiving this medicine. You will need important blood work done while you are taking this medicine. This drug may make you feel generally unwell. This is not uncommon, as chemotherapy can affect healthy cells as well as cancer cells. Report any side effects. Continue your course of treatment even though  you feel ill unless your doctor tells you to stop. In some cases, you may be given additional medicines to help with side effects. Follow all directions for their use. You may get drowsy or dizzy. Do not drive, use machinery, or do anything that needs mental alertness until you know how this medicine affects you. Do not stand or sit up quickly, especially if you are an older patient. This reduces the risk of dizzy or fainting spells. Call your health care professional for advice if you get a fever, chills, or sore throat, or other symptoms of a cold or flu. Do not treat yourself. This medicine decreases your body's ability to fight  infections. Try to avoid being around people who are sick. Avoid taking products that contain aspirin, acetaminophen, ibuprofen, naproxen, or ketoprofen unless instructed by your doctor. These medicines may hide a fever. This medicine may increase your risk to bruise or bleed. Call your doctor or health care professional if you notice any unusual bleeding. Be careful brushing and flossing your teeth or using a toothpick because you may get an infection or bleed more easily. If you have any dental work done, tell your dentist you are receiving this medicine. Do not become pregnant while taking this medicine or for 6 months after stopping it. Women should inform their health care professional if they wish to become pregnant or think they might be pregnant. Men should not father a child while taking this medicine and for 3 months after stopping it. There is potential for serious side effects to an unborn child. Talk to your health care professional for more information. Do not breast-feed an infant while taking this medicine or for 7 days after stopping it. This medicine has caused ovarian failure in some women. This medicine may make it more difficult to get pregnant. Talk to your health care professional if you are concerned about your fertility. This medicine has caused decreased sperm counts in some men. This may make it more difficult to father a child. Talk to your health care professional if you are concerned about your fertility. What side effects may I notice from receiving this medication? Side effects that you should report to your doctor or health care professional as soon as possible: allergic reactions like skin rash, itching or hives, swelling of the face, lips, or tongue chest pain diarrhea flushing, runny nose, sweating during infusion low blood counts - this medicine may decrease the number of white blood cells, red blood cells and platelets. You may be at increased risk for infections  and bleeding. nausea, vomiting pain, swelling, warmth in the leg signs of decreased platelets or bleeding - bruising, pinpoint red spots on the skin, black, tarry stools, blood in the urine signs of infection - fever or chills, cough, sore throat, pain or difficulty passing urine signs of decreased red blood cells - unusually weak or tired, fainting spells, lightheadedness Side effects that usually do not require medical attention (report to your doctor or health care professional if they continue or are bothersome): constipation hair loss headache loss of appetite mouth sores stomach pain This list may not describe all possible side effects. Call your doctor for medical advice about side effects. You may report side effects to FDA at 1-800-FDA-1088. Where should I keep my medication? This drug is given in a hospital or clinic and will not be stored at home. NOTE: This sheet is a summary. It may not cover all possible information. If you have questions about this  medicine, talk to your doctor, pharmacist, or health care provider.  2022 Elsevier/Gold Standard (2018-12-17 17:46:13)  Leucovorin injection What is this medication? LEUCOVORIN (loo koe VOR in) is used to prevent or treat the harmful effects of some medicines. This medicine is used to treat anemia caused by a low amount of folic acid in the body. It is also used with 5-fluorouracil (5-FU) to treat colon cancer. This medicine may be used for other purposes; ask your health care provider or pharmacist if you have questions. What should I tell my care team before I take this medication? They need to know if you have any of these conditions: anemia from low levels of vitamin B-12 in the blood an unusual or allergic reaction to leucovorin, folic acid, other medicines, foods, dyes, or preservatives pregnant or trying to get pregnant breast-feeding How should I use this medication? This medicine is for injection into a muscle or into  a vein. It is given by a health care professional in a hospital or clinic setting. Talk to your pediatrician regarding the use of this medicine in children. Special care may be needed. Overdosage: If you think you have taken too much of this medicine contact a poison control center or emergency room at once. NOTE: This medicine is only for you. Do not share this medicine with others. What if I miss a dose? This does not apply. What may interact with this medication? capecitabine fluorouracil phenobarbital phenytoin primidone trimethoprim-sulfamethoxazole This list may not describe all possible interactions. Give your health care provider a list of all the medicines, herbs, non-prescription drugs, or dietary supplements you use. Also tell them if you smoke, drink alcohol, or use illegal drugs. Some items may interact with your medicine. What should I watch for while using this medication? Your condition will be monitored carefully while you are receiving this medicine. This medicine may increase the side effects of 5-fluorouracil, 5-FU. Tell your doctor or health care professional if you have diarrhea or mouth sores that do not get better or that get worse. What side effects may I notice from receiving this medication? Side effects that you should report to your doctor or health care professional as soon as possible: allergic reactions like skin rash, itching or hives, swelling of the face, lips, or tongue breathing problems fever, infection mouth sores unusual bleeding or bruising unusually weak or tired Side effects that usually do not require medical attention (report to your doctor or health care professional if they continue or are bothersome): constipation or diarrhea loss of appetite nausea, vomiting This list may not describe all possible side effects. Call your doctor for medical advice about side effects. You may report side effects to FDA at 1-800-FDA-1088. Where should I keep  my medication? This drug is given in a hospital or clinic and will not be stored at home. NOTE: This sheet is a summary. It may not cover all possible information. If you have questions about this medicine, talk to your doctor, pharmacist, or health care provider.  2022 Elsevier/Gold Standard (2007-07-23 16:50:29)  Fluorouracil, 5-FU injection What is this medication? FLUOROURACIL, 5-FU (flure oh YOOR a sil) is a chemotherapy drug. It slows the growth of cancer cells. This medicine is used to treat many types of cancer like breast cancer, colon or rectal cancer, pancreatic cancer, and stomach cancer. This medicine may be used for other purposes; ask your health care provider or pharmacist if you have questions. COMMON BRAND NAME(S): Adrucil What should I tell my care  team before I take this medication? They need to know if you have any of these conditions: blood disorders dihydropyrimidine dehydrogenase (DPD) deficiency infection (especially a virus infection such as chickenpox, cold sores, or herpes) kidney disease liver disease malnourished, poor nutrition recent or ongoing radiation therapy an unusual or allergic reaction to fluorouracil, other chemotherapy, other medicines, foods, dyes, or preservatives pregnant or trying to get pregnant breast-feeding How should I use this medication? This drug is given as an infusion or injection into a vein. It is administered in a hospital or clinic by a specially trained health care professional. Talk to your pediatrician regarding the use of this medicine in children. Special care may be needed. Overdosage: If you think you have taken too much of this medicine contact a poison control center or emergency room at once. NOTE: This medicine is only for you. Do not share this medicine with others. What if I miss a dose? It is important not to miss your dose. Call your doctor or health care professional if you are unable to keep an  appointment. What may interact with this medication? Do not take this medicine with any of the following medications: live virus vaccines This medicine may also interact with the following medications: medicines that treat or prevent blood clots like warfarin, enoxaparin, and dalteparin This list may not describe all possible interactions. Give your health care provider a list of all the medicines, herbs, non-prescription drugs, or dietary supplements you use. Also tell them if you smoke, drink alcohol, or use illegal drugs. Some items may interact with your medicine. What should I watch for while using this medication? Visit your doctor for checks on your progress. This drug may make you feel generally unwell. This is not uncommon, as chemotherapy can affect healthy cells as well as cancer cells. Report any side effects. Continue your course of treatment even though you feel ill unless your doctor tells you to stop. In some cases, you may be given additional medicines to help with side effects. Follow all directions for their use. Call your doctor or health care professional for advice if you get a fever, chills or sore throat, or other symptoms of a cold or flu. Do not treat yourself. This drug decreases your body's ability to fight infections. Try to avoid being around people who are sick. This medicine may increase your risk to bruise or bleed. Call your doctor or health care professional if you notice any unusual bleeding. Be careful brushing and flossing your teeth or using a toothpick because you may get an infection or bleed more easily. If you have any dental work done, tell your dentist you are receiving this medicine. Avoid taking products that contain aspirin, acetaminophen, ibuprofen, naproxen, or ketoprofen unless instructed by your doctor. These medicines may hide a fever. Do not become pregnant while taking this medicine. Women should inform their doctor if they wish to become pregnant  or think they might be pregnant. There is a potential for serious side effects to an unborn child. Talk to your health care professional or pharmacist for more information. Do not breast-feed an infant while taking this medicine. Men should inform their doctor if they wish to father a child. This medicine may lower sperm counts. Do not treat diarrhea with over the counter products. Contact your doctor if you have diarrhea that lasts more than 2 days or if it is severe and watery. This medicine can make you more sensitive to the sun. Keep out of  the sun. If you cannot avoid being in the sun, wear protective clothing and use sunscreen. Do not use sun lamps or tanning beds/booths. What side effects may I notice from receiving this medication? Side effects that you should report to your doctor or health care professional as soon as possible: allergic reactions like skin rash, itching or hives, swelling of the face, lips, or tongue low blood counts - this medicine may decrease the number of white blood cells, red blood cells and platelets. You may be at increased risk for infections and bleeding. signs of infection - fever or chills, cough, sore throat, pain or difficulty passing urine signs of decreased platelets or bleeding - bruising, pinpoint red spots on the skin, black, tarry stools, blood in the urine signs of decreased red blood cells - unusually weak or tired, fainting spells, lightheadedness breathing problems changes in vision chest pain mouth sores nausea and vomiting pain, swelling, redness at site where injected pain, tingling, numbness in the hands or feet redness, swelling, or sores on hands or feet stomach pain unusual bleeding Side effects that usually do not require medical attention (report to your doctor or health care professional if they continue or are bothersome): changes in finger or toe nails diarrhea dry or itchy skin hair loss headache loss of appetite sensitivity  of eyes to the light stomach upset unusually teary eyes This list may not describe all possible side effects. Call your doctor for medical advice about side effects. You may report side effects to FDA at 1-800-FDA-1088. Where should I keep my medication? This drug is given in a hospital or clinic and will not be stored at home. NOTE: This sheet is a summary. It may not cover all possible information. If you have questions about this medicine, talk to your doctor, pharmacist, or health care provider.  2022 Elsevier/Gold Standard (2018-12-17 15:00:03)

## 2020-12-01 NOTE — Progress Notes (Signed)
Okay to treat patient today with platelet count of 98,000 and to use CMP results from 11/23/2020 per Dr. Benay Spice.   Patient presents for treatment. RN assessment completed along with the following:  Labs/vitals reviewed - Dr. Benay Spice said it was okay to use the CMP results from 11/23/2020 and to use today's platelet count of 98,000. Dr. Benay Spice wanted me to verify that the irinotecan had been dose reduced and it was on the last tx.  Weight within 10% of previous measurement - Yes Oncology Treatment Attestation completed for current therapy- Yes, on date 09/28/2020 Informed consent completed and reflects current therapy/intent - Yes, on date 10/12/2020             Provider progress note reviewed - Patient not seen by provider today. Most recent note dated 11/23/2020 reviewed.  Treatment/Antibody/Supportive plan reviewed - Yes, and there are no adjustments needed for today's treatment. S&H and other orders reviewed - Yes, and there are no additional orders identified. Previous treatment date reviewed - Yes, and the appropriate amount of time has elapsed between treatments. Clinic Hand Off Received from - no hand off received today; pt did not see physician team  Patient to proceed with treatment.

## 2020-12-01 NOTE — Telephone Encounter (Signed)
Patient seen by Dr. Sherrill today ? ?Vitals are within treatment parameters. ? ?Labs reviewed by Dr. Sherrill and are within treatment parameters. ? ?Per physician team, patient is ready for treatment and there are NO modifications to the treatment plan.  ?

## 2020-12-03 ENCOUNTER — Inpatient Hospital Stay: Payer: Medicare Other

## 2020-12-03 ENCOUNTER — Other Ambulatory Visit: Payer: Self-pay

## 2020-12-03 VITALS — BP 130/57 | HR 73 | Temp 98.1°F

## 2020-12-03 DIAGNOSIS — C20 Malignant neoplasm of rectum: Secondary | ICD-10-CM

## 2020-12-03 DIAGNOSIS — Z5111 Encounter for antineoplastic chemotherapy: Secondary | ICD-10-CM | POA: Diagnosis not present

## 2020-12-03 MED ORDER — SODIUM CHLORIDE 0.9% FLUSH
10.0000 mL | INTRAVENOUS | Status: DC | PRN
  Administered 2020-12-03: 10 mL

## 2020-12-03 MED ORDER — HEPARIN SOD (PORK) LOCK FLUSH 100 UNIT/ML IV SOLN
500.0000 [IU] | Freq: Once | INTRAVENOUS | Status: AC | PRN
  Administered 2020-12-03: 500 [IU]

## 2020-12-06 ENCOUNTER — Ambulatory Visit: Payer: Medicare Other | Admitting: Nurse Practitioner

## 2020-12-06 ENCOUNTER — Other Ambulatory Visit: Payer: Medicare Other

## 2020-12-06 ENCOUNTER — Ambulatory Visit: Payer: Medicare Other

## 2020-12-07 ENCOUNTER — Ambulatory Visit: Payer: Medicare Other

## 2020-12-07 ENCOUNTER — Other Ambulatory Visit: Payer: Medicare Other

## 2020-12-07 ENCOUNTER — Ambulatory Visit: Payer: Medicare Other | Admitting: Nurse Practitioner

## 2020-12-19 ENCOUNTER — Other Ambulatory Visit: Payer: Self-pay | Admitting: Oncology

## 2020-12-22 ENCOUNTER — Inpatient Hospital Stay: Payer: Medicare Other

## 2020-12-22 ENCOUNTER — Telehealth: Payer: Self-pay | Admitting: *Deleted

## 2020-12-22 ENCOUNTER — Other Ambulatory Visit: Payer: Self-pay

## 2020-12-22 ENCOUNTER — Inpatient Hospital Stay (HOSPITAL_BASED_OUTPATIENT_CLINIC_OR_DEPARTMENT_OTHER): Payer: Medicare Other | Admitting: Oncology

## 2020-12-22 ENCOUNTER — Other Ambulatory Visit: Payer: Self-pay | Admitting: *Deleted

## 2020-12-22 VITALS — BP 151/73 | HR 70 | Temp 98.3°F | Resp 20

## 2020-12-22 VITALS — BP 134/107 | HR 96 | Temp 98.1°F | Resp 18 | Ht 63.0 in | Wt 216.2 lb

## 2020-12-22 DIAGNOSIS — C20 Malignant neoplasm of rectum: Secondary | ICD-10-CM

## 2020-12-22 DIAGNOSIS — Z5111 Encounter for antineoplastic chemotherapy: Secondary | ICD-10-CM | POA: Diagnosis not present

## 2020-12-22 LAB — CBC WITH DIFFERENTIAL (CANCER CENTER ONLY)
Abs Immature Granulocytes: 0.08 10*3/uL — ABNORMAL HIGH (ref 0.00–0.07)
Basophils Absolute: 0 10*3/uL (ref 0.0–0.1)
Basophils Relative: 1 %
Eosinophils Absolute: 0.1 10*3/uL (ref 0.0–0.5)
Eosinophils Relative: 3 %
HCT: 31.2 % — ABNORMAL LOW (ref 36.0–46.0)
Hemoglobin: 10.6 g/dL — ABNORMAL LOW (ref 12.0–15.0)
Immature Granulocytes: 3 %
Lymphocytes Relative: 21 %
Lymphs Abs: 0.7 10*3/uL (ref 0.7–4.0)
MCH: 34.1 pg — ABNORMAL HIGH (ref 26.0–34.0)
MCHC: 34 g/dL (ref 30.0–36.0)
MCV: 100.3 fL — ABNORMAL HIGH (ref 80.0–100.0)
Monocytes Absolute: 0.6 10*3/uL (ref 0.1–1.0)
Monocytes Relative: 19 %
Neutro Abs: 1.7 10*3/uL (ref 1.7–7.7)
Neutrophils Relative %: 53 %
Platelet Count: 104 10*3/uL — ABNORMAL LOW (ref 150–400)
RBC: 3.11 MIL/uL — ABNORMAL LOW (ref 3.87–5.11)
RDW: 16.3 % — ABNORMAL HIGH (ref 11.5–15.5)
WBC Count: 3.2 10*3/uL — ABNORMAL LOW (ref 4.0–10.5)
nRBC: 0 % (ref 0.0–0.2)

## 2020-12-22 LAB — CMP (CANCER CENTER ONLY)
ALT: 28 U/L (ref 0–44)
AST: 32 U/L (ref 15–41)
Albumin: 3.4 g/dL — ABNORMAL LOW (ref 3.5–5.0)
Alkaline Phosphatase: 86 U/L (ref 38–126)
Anion gap: 9 (ref 5–15)
BUN: 14 mg/dL (ref 8–23)
CO2: 25 mmol/L (ref 22–32)
Calcium: 9.2 mg/dL (ref 8.9–10.3)
Chloride: 109 mmol/L (ref 98–111)
Creatinine: 0.74 mg/dL (ref 0.44–1.00)
GFR, Estimated: >60 mL/min (ref >60)
Glucose, Bld: 91 mg/dL (ref 70–99)
Potassium: 3.8 mmol/L (ref 3.5–5.1)
Sodium: 143 mmol/L (ref 135–145)
Total Bilirubin: 0.9 mg/dL (ref 0.3–1.2)
Total Protein: 6 g/dL — ABNORMAL LOW (ref 6.5–8.1)

## 2020-12-22 MED ORDER — ATROPINE SULFATE 1 MG/ML IV SOLN
0.5000 mg | Freq: Once | INTRAVENOUS | Status: AC | PRN
  Administered 2020-12-22: 0.5 mg via INTRAVENOUS
  Filled 2020-12-22 (×2): qty 1

## 2020-12-22 MED ORDER — SODIUM CHLORIDE 0.9 % IV SOLN: Freq: Once | INTRAVENOUS | Status: AC

## 2020-12-22 MED ORDER — DEXAMETHASONE SODIUM PHOSPHATE 10 MG/ML IJ SOLN
5.0000 mg | Freq: Once | INTRAMUSCULAR | Status: AC
  Administered 2020-12-22: 5 mg via INTRAVENOUS
  Filled 2020-12-22: qty 1

## 2020-12-22 MED ORDER — SODIUM CHLORIDE 0.9 % IV SOLN
135.0000 mg/m2 | Freq: Once | INTRAVENOUS | Status: AC
  Administered 2020-12-22: 280 mg via INTRAVENOUS
  Filled 2020-12-22: qty 14

## 2020-12-22 MED ORDER — SODIUM CHLORIDE 0.9 % IV SOLN
1800.0000 mg/m2 | INTRAVENOUS | Status: DC
  Administered 2020-12-22: 3700 mg via INTRAVENOUS
  Filled 2020-12-22: qty 74

## 2020-12-22 MED ORDER — PALONOSETRON HCL INJECTION 0.25 MG/5ML
0.2500 mg | Freq: Once | INTRAVENOUS | Status: AC
  Administered 2020-12-22: 0.25 mg via INTRAVENOUS
  Filled 2020-12-22: qty 5

## 2020-12-22 MED ORDER — SODIUM CHLORIDE 0.9 % IV SOLN
300.0000 mg/m2 | Freq: Once | INTRAVENOUS | Status: AC
  Administered 2020-12-22: 618 mg via INTRAVENOUS
  Filled 2020-12-22: qty 30.9

## 2020-12-22 MED ORDER — FLUOROURACIL CHEMO INJECTION 2.5 GM/50ML
300.0000 mg/m2 | Freq: Once | INTRAVENOUS | Status: AC
  Administered 2020-12-22: 600 mg via INTRAVENOUS
  Filled 2020-12-22: qty 12

## 2020-12-22 NOTE — Patient Instructions (Signed)
[facility]   Discharge Instructions: Thank you for choosing [facility] to provide your oncology and hematology care.   If you have a lab appointment with the Cancer Center, please go directly to the Cancer Center and check in at the registration area.   Wear comfortable clothing and clothing appropriate for easy access to any Portacath or PICC line.   We strive to give you quality time with your provider. You may need to reschedule your appointment if you arrive late (15 or more minutes).  Arriving late affects you and other patients whose appointments are after yours.  Also, if you miss three or more appointments without notifying the office, you may be dismissed from the clinic at the provider's discretion.      For prescription refill requests, have your pharmacy contact our office and allow 72 hours for refills to be completed.    Today you received the following chemotherapy and/or immunotherapy agents Irinotecan (CAMPTOSAR), Leucovorin & Flourouracil (ADRUCIL).      To help prevent nausea and vomiting after your treatment, we encourage you to take your nausea medication as directed.  BELOW ARE SYMPTOMS THAT SHOULD BE REPORTED IMMEDIATELY: *FEVER GREATER THAN 100.4 F (38 C) OR HIGHER *CHILLS OR SWEATING *NAUSEA AND VOMITING THAT IS NOT CONTROLLED WITH YOUR NAUSEA MEDICATION *UNUSUAL SHORTNESS OF BREATH *UNUSUAL BRUISING OR BLEEDING *URINARY PROBLEMS (pain or burning when urinating, or frequent urination) *BOWEL PROBLEMS (unusual diarrhea, constipation, pain near the anus) TENDERNESS IN MOUTH AND THROAT WITH OR WITHOUT PRESENCE OF ULCERS (sore throat, sores in mouth, or a toothache) UNUSUAL RASH, SWELLING OR PAIN  UNUSUAL VAGINAL DISCHARGE OR ITCHING   Items with * indicate a potential emergency and should be followed up as soon as possible or go to the Emergency Department if any problems should occur.  Please show the CHEMOTHERAPY ALERT  CARD or IMMUNOTHERAPY ALERT CARD at check-in to the Emergency Department and triage nurse.  Should you have questions after your visit or need to cancel or reschedule your appointment, please contact [facility]  Dept: [phone]  and follow the prompts.  Office hours are 8:00 a.m. to 4:30 p.m. Monday - Friday. Please note that voicemails left after 4:00 p.m. may not be returned until the following business day.  We are closed weekends and major holidays. You have access to a nurse at all times for urgent questions. Please call the main number to the clinic Dept: [phone] and follow the prompts.   For any non-urgent questions, you may also contact your provider using MyChart. We now offer e-Visits for anyone 18 and older to request care online for non-urgent symptoms. For details visit [email or web address].   Also download the MyChart app! Go to the app store, search "MyChart", open the app, select [facility], and log in with your MyChart username and password.  Due to Covid, a mask is required upon entering the hospital/clinic. If you do not have a mask, one will be given to you upon arrival. For doctor visits, patients may have 1 support person aged 18 or older with them. For treatment visits, patients cannot have anyone with them due to current Covid guidelines and our immunocompromised population.   Irinotecan injection What is this medication? IRINOTECAN (ir in oh TEE kan ) is a chemotherapy drug. It is used to treat colon and rectal cancer. This medicine may be used for other purposes; ask your health care provider or pharmacist if you   have questions. COMMON BRAND NAME(S): Camptosar What should I tell my care team before I take this medication? They need to know if you have any of these conditions: dehydration diarrhea infection (especially a virus infection such as chickenpox, cold sores, or herpes) liver disease low blood counts, like low white cell,  platelet, or red cell counts low levels of calcium, magnesium, or potassium in the blood recent or ongoing radiation therapy an unusual or allergic reaction to irinotecan, other medicines, foods, dyes, or preservatives pregnant or trying to get pregnant breast-feeding How should I use this medication? This drug is given as an infusion into a vein. It is administered in a hospital or clinic by a specially trained health care professional. Talk to your pediatrician regarding the use of this medicine in children. Special care may be needed. Overdosage: If you think you have taken too much of this medicine contact a poison control center or emergency room at once. NOTE: This medicine is only for you. Do not share this medicine with others. What if I miss a dose? It is important not to miss your dose. Call your doctor or health care professional if you are unable to keep an appointment. What may interact with this medication? Do not take this medicine with any of the following medications: cobicistat itraconazole This medicine may interact with the following medications: antiviral medicines for HIV or AIDS certain antibiotics like rifampin or rifabutin certain medicines for fungal infections like ketoconazole, posaconazole, and voriconazole certain medicines for seizures like carbamazepine, phenobarbital, phenotoin clarithromycin gemfibrozil nefazodone St. John's Wort This list may not describe all possible interactions. Give your health care provider a list of all the medicines, herbs, non-prescription drugs, or dietary supplements you use. Also tell them if you smoke, drink alcohol, or use illegal drugs. Some items may interact with your medicine. What should I watch for while using this medication? Your condition will be monitored carefully while you are receiving this medicine. You will need important blood work done while you are taking this medicine. This drug may make you feel  generally unwell. This is not uncommon, as chemotherapy can affect healthy cells as well as cancer cells. Report any side effects. Continue your course of treatment even though you feel ill unless your doctor tells you to stop. In some cases, you may be given additional medicines to help with side effects. Follow all directions for their use. You may get drowsy or dizzy. Do not drive, use machinery, or do anything that needs mental alertness until you know how this medicine affects you. Do not stand or sit up quickly, especially if you are an older patient. This reduces the risk of dizzy or fainting spells. Call your health care professional for advice if you get a fever, chills, or sore throat, or other symptoms of a cold or flu. Do not treat yourself. This medicine decreases your body's ability to fight infections. Try to avoid being around people who are sick. Avoid taking products that contain aspirin, acetaminophen, ibuprofen, naproxen, or ketoprofen unless instructed by your doctor. These medicines may hide a fever. This medicine may increase your risk to bruise or bleed. Call your doctor or health care professional if you notice any unusual bleeding. Be careful brushing and flossing your teeth or using a toothpick because you may get an infection or bleed more easily. If you have any dental work done, tell your dentist you are receiving this medicine. Do not become pregnant while taking this medicine or for   6 months after stopping it. Women should inform their health care professional if they wish to become pregnant or think they might be pregnant. Men should not father a child while taking this medicine and for 3 months after stopping it. There is potential for serious side effects to an unborn child. Talk to your health care professional for more information. Do not breast-feed an infant while taking this medicine or for 7 days after stopping it. This medicine has caused ovarian failure in some  women. This medicine may make it more difficult to get pregnant. Talk to your health care professional if you are concerned about your fertility. This medicine has caused decreased sperm counts in some men. This may make it more difficult to father a child. Talk to your health care professional if you are concerned about your fertility. What side effects may I notice from receiving this medication? Side effects that you should report to your doctor or health care professional as soon as possible: allergic reactions like skin rash, itching or hives, swelling of the face, lips, or tongue chest pain diarrhea flushing, runny nose, sweating during infusion low blood counts - this medicine may decrease the number of white blood cells, red blood cells and platelets. You may be at increased risk for infections and bleeding. nausea, vomiting pain, swelling, warmth in the leg signs of decreased platelets or bleeding - bruising, pinpoint red spots on the skin, black, tarry stools, blood in the urine signs of infection - fever or chills, cough, sore throat, pain or difficulty passing urine signs of decreased red blood cells - unusually weak or tired, fainting spells, lightheadedness Side effects that usually do not require medical attention (report to your doctor or health care professional if they continue or are bothersome): constipation hair loss headache loss of appetite mouth sores stomach pain This list may not describe all possible side effects. Call your doctor for medical advice about side effects. You may report side effects to FDA at 1-800-FDA-1088. Where should I keep my medication? This drug is given in a hospital or clinic and will not be stored at home. NOTE: This sheet is a summary. It may not cover all possible information. If you have questions about this medicine, talk to your doctor, pharmacist, or health care provider.  2022 [employer] Standard (2020-10-05  00:00:00)  Leucovorin injection What is this medication? LEUCOVORIN (loo koe VOR in) is used to prevent or treat the harmful effects of some medicines. This medicine is used to treat anemia caused by a low amount of folic acid in the body. It is also used with 5-fluorouracil (5-FU) to treat colon cancer. This medicine may be used for other purposes; ask your health care provider or pharmacist if you have questions. What should I tell my care team before I take this medication? They need to know if you have any of these conditions: anemia from low levels of vitamin B-12 in the blood an unusual or allergic reaction to leucovorin, folic acid, other medicines, foods, dyes, or preservatives pregnant or trying to get pregnant breast-feeding How should I use this medication? This medicine is for injection into a muscle or into a vein. It is given by a health care professional in a hospital or clinic setting. Talk to your pediatrician regarding the use of this medicine in children. Special care may be needed. Overdosage: If you think you have taken too much of this medicine contact a poison control center or emergency room at once. NOTE: This  medicine is only for you. Do not share this medicine with others. What if I miss a dose? This does not apply. What may interact with this medication? capecitabine fluorouracil phenobarbital phenytoin primidone trimethoprim-sulfamethoxazole This list may not describe all possible interactions. Give your health care provider a list of all the medicines, herbs, non-prescription drugs, or dietary supplements you use. Also tell them if you smoke, drink alcohol, or use illegal drugs. Some items may interact with your medicine. What should I watch for while using this medication? Your condition will be monitored carefully while you are receiving this medicine. This medicine may increase the side effects of 5-fluorouracil, 5-FU. Tell your doctor or health care  professional if you have diarrhea or mouth sores that do not get better or that get worse. What side effects may I notice from receiving this medication? Side effects that you should report to your doctor or health care professional as soon as possible: allergic reactions like skin rash, itching or hives, swelling of the face, lips, or tongue breathing problems fever, infection mouth sores unusual bleeding or bruising unusually weak or tired Side effects that usually do not require medical attention (report to your doctor or health care professional if they continue or are bothersome): constipation or diarrhea loss of appetite nausea, vomiting This list may not describe all possible side effects. Call your doctor for medical advice about side effects. You may report side effects to FDA at 1-800-FDA-1088. Where should I keep my medication? This drug is given in a hospital or clinic and will not be stored at home. NOTE: This sheet is a summary. It may not cover all possible information. If you have questions about this medicine, talk to your doctor, pharmacist, or health care provider.  2022 Elsevier/Gold Standard (2007-07-25 00:00:00)  Fluorouracil, 5-FU injection What is this medication? FLUOROURACIL, 5-FU (flure oh YOOR a sil) is a chemotherapy drug. It slows the growth of cancer cells. This medicine is used to treat many types of cancer like breast cancer, colon or rectal cancer, pancreatic cancer, and stomach cancer. This medicine may be used for other purposes; ask your health care provider or pharmacist if you have questions. COMMON BRAND NAME(S): Adrucil What should I tell my care team before I take this medication? They need to know if you have any of these conditions: blood disorders dihydropyrimidine dehydrogenase (DPD) deficiency infection (especially a virus infection such as chickenpox, cold sores, or herpes) kidney disease liver disease malnourished, poor  nutrition recent or ongoing radiation therapy an unusual or allergic reaction to fluorouracil, other chemotherapy, other medicines, foods, dyes, or preservatives pregnant or trying to get pregnant breast-feeding How should I use this medication? This drug is given as an infusion or injection into a vein. It is administered in a hospital or clinic by a specially trained health care professional. Talk to your pediatrician regarding the use of this medicine in children. Special care may be needed. Overdosage: If you think you have taken too much of this medicine contact a poison control center or emergency room at once. NOTE: This medicine is only for you. Do not share this medicine with others. What if I miss a dose? It is important not to miss your dose. Call your doctor or health care professional if you are unable to keep an appointment. What may interact with this medication? Do not take this medicine with any of the following medications: live virus vaccines This medicine may also interact with the following medications: medicines that treat   or prevent blood clots like warfarin, enoxaparin, and dalteparin This list may not describe all possible interactions. Give your health care provider a list of all the medicines, herbs, non-prescription drugs, or dietary supplements you use. Also tell them if you smoke, drink alcohol, or use illegal drugs. Some items may interact with your medicine. What should I watch for while using this medication? Visit your doctor for checks on your progress. This drug may make you feel generally unwell. This is not uncommon, as chemotherapy can affect healthy cells as well as cancer cells. Report any side effects. Continue your course of treatment even though you feel ill unless your doctor tells you to stop. In some cases, you may be given additional medicines to help with side effects. Follow all directions for their use. Call your doctor or health care  professional for advice if you get a fever, chills or sore throat, or other symptoms of a cold or flu. Do not treat yourself. This drug decreases your body's ability to fight infections. Try to avoid being around people who are sick. This medicine may increase your risk to bruise or bleed. Call your doctor or health care professional if you notice any unusual bleeding. Be careful brushing and flossing your teeth or using a toothpick because you may get an infection or bleed more easily. If you have any dental work done, tell your dentist you are receiving this medicine. Avoid taking products that contain aspirin, acetaminophen, ibuprofen, naproxen, or ketoprofen unless instructed by your doctor. These medicines may hide a fever. Do not become pregnant while taking this medicine. Women should inform their doctor if they wish to become pregnant or think they might be pregnant. There is a potential for serious side effects to an unborn child. Talk to your health care professional or pharmacist for more information. Do not breast-feed an infant while taking this medicine. Men should inform their doctor if they wish to father a child. This medicine may lower sperm counts. Do not treat diarrhea with over the counter products. Contact your doctor if you have diarrhea that lasts more than 2 days or if it is severe and watery. This medicine can make you more sensitive to the sun. Keep out of the sun. If you cannot avoid being in the sun, wear protective clothing and use sunscreen. Do not use sun lamps or tanning beds/booths. What side effects may I notice from receiving this medication? Side effects that you should report to your doctor or health care professional as soon as possible: allergic reactions like skin rash, itching or hives, swelling of the face, lips, or tongue low blood counts - this medicine may decrease the number of white blood cells, red blood cells and platelets. You may be at increased risk for  infections and bleeding. signs of infection - fever or chills, cough, sore throat, pain or difficulty passing urine signs of decreased platelets or bleeding - bruising, pinpoint red spots on the skin, black, tarry stools, blood in the urine signs of decreased red blood cells - unusually weak or tired, fainting spells, lightheadedness breathing problems changes in vision chest pain mouth sores nausea and vomiting pain, swelling, redness at site where injected pain, tingling, numbness in the hands or feet redness, swelling, or sores on hands or feet stomach pain unusual bleeding Side effects that usually do not require medical attention (report to your doctor or health care professional if they continue or are bothersome): changes in finger or toe nails diarrhea dry or   itchy skin hair loss headache loss of appetite sensitivity of eyes to the light stomach upset unusually teary eyes This list may not describe all possible side effects. Call your doctor for medical advice about side effects. You may report side effects to FDA at 1-800-FDA-1088. Where should I keep my medication? This drug is given in a hospital or clinic and will not be stored at home. NOTE: This sheet is a summary. It may not cover all possible information. If you have questions about this medicine, talk to your doctor, pharmacist, or health care provider.  2022 [employer] Standard (2020-10-05 00:00:00)  The chemotherapy medication bag should finish at 46 hours, 96 hours, or 7 days. For example, if your pump is scheduled for 46 hours and it was put on at 4:00 p.m., it should finish at 2:00 p.m. the day it is scheduled to come off regardless of your appointment time.     Estimated time to finish at 11:30 a.m. on Friday 12/24/2020 .   If the display on your pump reads "Low Volume" and it is beeping, take the batteries out of the pump and come to the cancer center for it to be taken off.   If the pump alarms go off  prior to the pump reading "Low Volume" then call [phone] and someone can assist you.  If the plunger comes out and the chemotherapy medication is leaking out, please use your home chemo spill kit to clean up the spill. Do NOT use paper towels or other household products.  If you have problems or questions regarding your pump, please call either [phone] (24 hours a day) or the cancer center Monday-Friday 8:00 a.m.- 4:30 p.m. at the clinic number and we will assist you. If you are unable to get assistance, then go to the nearest Emergency Department and ask the staff to contact the IV team for assistance.

## 2020-12-22 NOTE — Progress Notes (Signed)
[facility] OFFICE PROGRESS NOTE   Diagnosis: Rectal cancer  INTERVAL HISTORY:   Samantha Lutz completed another cycle of FOLFIRI on 12/01/2020.  She reports developing chest discomfort and an increased cough immediately following this cycle of chemotherapy.  She continues to have increased malaise and exertional dyspnea.  No nausea.  She had a few episodes of diarrhea approximately 10 days following chemotherapy.  This improved with Imodium.  She continues to have neuropathy symptoms in the feet.  Objective:  Vital signs in last 24 hours:  Blood pressure (!) 134/107, pulse 96, temperature 98.1 F (36.7 C), temperature source Oral, resp. rate 18, height '5\' 3"'  (1.6 m), weight 216 lb 3.2 oz (98.1 kg), SpO2 100 %.    HEENT: No thrush or ulcers Resp: Decreased breath sounds at the right lower chest, no respiratory distress Cardio: Regular rate and rhythm GI: No hepatosplenomegaly, mild tenderness in the left lateral abdomen, no mass Vascular: No leg edema  Skin: Palms without erythema  Portacath/PICC-without erythema  Lab Results:  Lab Results  Component Value Date   WBC 3.2 (L) 12/22/2020   HGB 10.6 (L) 12/22/2020   HCT 31.2 (L) 12/22/2020   MCV 100.3 (H) 12/22/2020   PLT 104 (L) 12/22/2020   NEUTROABS 1.7 12/22/2020    CMP  Lab Results  Component Value Date   NA 143 12/01/2020   K 3.7 12/01/2020   CL 111 12/01/2020   CO2 23 12/01/2020   GLUCOSE 86 12/01/2020   BUN 19 12/01/2020   CREATININE 0.72 12/01/2020   CALCIUM 8.5 (L) 12/01/2020   PROT 6.0 (L) 12/01/2020   ALBUMIN 3.2 (L) 12/01/2020   AST 41 12/01/2020   ALT 36 12/01/2020   ALKPHOS 89 12/01/2020   BILITOT 0.9 12/01/2020   GFRNONAA >60 12/01/2020   GFRAA >60 10/28/2019    Lab Results  Component Value Date   CEA1 <1.00 06/01/2020   CEA <1.00 06/01/2020    Medications: I have reviewed the patient's current medications.   Assessment/Plan: Rectal cancer Mass at 7 cm from the anal  verge on colonoscopy 08/09/2017, biopsy revealed invasive adenocarcinoma Staging CTs 08/17/2017-no evidence of metastatic disease, asymmetric thickening in the mid rectum MR pelvis 09/01/2017, T3N0 lesion beginning at 6.3 cm from the anal sphincter Radiation/Xeloda initiated 09/17/2017, completed 10/25/2017 Low anterior resection/diverting ileostomy 12/21/2017,ypT3,ypN1a tumor.  Lymphovascular invasion present, intact mismatch repair protein expression, treatment effect present (TRS 1).  Mismatch repair protein IHC normal; Foundation 1-KRAS/NRAS wild-type, microsatellite status and tumor mutational burden could not be determined. Cycle 1 adjuvant Xeloda beginning 01/21/2018 Cycle 2 adjuvant Xeloda beginning 02/11/2018 Xeloda discontinued after cycle 2 secondary to patient preference Ileostomy takedown 07/10/2018 CTs 09/07/2018- multiple live small pulmonary nodules concerning for metastatic disease CT chest 12/10/2018-multiple bilateral lung nodules, some have increased in size CT chest 04/08/2019-mild enlargement of bilateral lung nodules Status post SBRT multiple lung nodules 05/06/2019, 05/08/2019, 05/13/2019 CT chest 08/28/2019-improvement and resolution in majority of right-sided pulmonary nodules, a superior segment right lower lobe nodule has increased, no new right-sided nodules.  Progressive enlargement of left-sided pulmonary nodules PET scan 09/09/2019-hypermetabolic right lower lobe nodule, 2 hypermetabolic left upper lobe nodules with an additional 0.7 cm left upper lobe nodule below PET resolution, groundglass opacity in the mid to right lower lobe with associated hypermetabolism consistent with postradiation change, hypermetabolic central segment 4A liver lesion Cycle 1 FOLFOX 10/14/2019 Cycle  2FOLFOX 11/11/2019, 5-FU and oxaliplatin dose reduced secondary to neutropenia and thrombocytopenia following cycle one, G-CSF declined Cycle 3  FOLFOX 11/26/2019 Cycle 4 FOLFOX 12/11/2019, oxaliplatin held due to  neutropenia and thrombocytopenia Cycle 5 FOLFOX 12/31/2019 Cycle 6 FOLFOX 01/14/2020 (oxaliplatin held, 5-fluorouracil dose reduced due to mucositis) CT chest 01/27/2020-improvement in left lung nodules, progressive airspace disease with traction bronchiectasis throughout the right lung, previously noted right lung nodules are obscured, mildly enlarged right paratracheal lymph nodes-not hypermetabolic on prior PET, likely reactive Cycle 7 FOLFOX 01/28/2020 Cycle 8 FOLFOX 02/11/2020 (oxaliplatin held due to neutropenia and thrombocytopenia) Cycle 9 FOLFOX 02/25/2020 (oxaliplatin held due to thrombocytopenia) Cycle 10 FOLFOX 03/11/2020 (oxaliplatin held secondary to neuropathy) Cycle 11 FOLFOX 03/25/2020 (oxaliplatin held due to neuropathy) CT chest 04/05/2020-no new or progressive findings.  Interval evolution of presumed postradiation scarring in the right perihilar lung with decrease in the more diffuse groundglass opacity seen previously.  No substantial change in left lung nodules. 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  No definite CT findings to suggest recurrent tumor.  Stable small left upper lobe pulmonary nodules.  No new or progressive findings.  Stable small right paratracheal lymph nodes.  Stable area of irregular low-attenuation in hepatic segment 4A, site of known prior hepatic metastatic lesion.  No new or progressive findings. Cycle 18 5-fluorouracil 07/06/2020 Cycle 19 5-fluorouracil 07/20/2020 Cycle 20 5-fluorouracil 08/03/2020 Cycle 21 5-fluorouracil 08/17/2020  Cycle 22 5-fluorouracil 08/31/2020 Cycle 23 5-fluorouracil 09/14/2020 CT chest 09/24/2020-increased number and size of pulmonary nodules  bilaterally.  Findings in the right upper lobe are concerning for potential lymphangitic spread of tumor.  Right upper lobe findings could also reflect evolving postradiation change. Cycle 1 FOLFIRI 10/12/2020 Cycle 2 FOLFIRI 11/08/2020, irinotecan dose reduced due to neutropenia and thrombocytopenia following cycle 1 Cycle 3 FOLFIRI 12/01/2020 Cycle 4 FOLFIRI 12/22/2020   2.   Hypothyroid   3.    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  Port-A-Cath placement 09/29/2019, interventional radiology   5.  Neutropenia and thrombocytopenia following cycle 1 FOLFOX-plan chemotherapy dose reductions, she declined G-CSF   6.  Mucositis following cycle 5 FOLFOX, 5-fluorouracil dose reduced with cycle 6 Mucositis following cycle 22 5-fluorouracil-Magic mouthwash added   7.  Right lung airspace disease/volume loss-likely toxicity from chest radiation, trial of prednisone 01/29/2020; dyspnea and cough improved 02/11/2020 Progressive cough 03/11/2020-prednisone resumed at a dose of 20 mg daily Cough and dyspnea improved 03/25/2020-prednisone taper to 15 mg daily Improved 04/06/2020-prednisone taper to 10 mg daily Stable 04/21/2020-prednisone taper to 5 mg daily     Disposition: Samantha Lutz has completed 3 cycles of FOLFIRI.  She reports increased exertional dyspnea and malaise.  She will complete cycle 4 FOLFIRI today.  She will return for an office visit and restaging chest CT in 3 weeks.  Betsy Coder, MD  12/22/2020  10:36 AM

## 2020-12-22 NOTE — Progress Notes (Signed)
CT scheduled for 12/14 at 0800 at [facility]. Called her with appointment and was informed she can't get there till 0830. Provided her the phone # for central scheduling to make change herself.

## 2020-12-22 NOTE — Telephone Encounter (Signed)
Patient called to report she wants her CT chest at [facility].

## 2020-12-22 NOTE — Progress Notes (Signed)
Patient presents for treatment. RN assessment completed along with the following:  Labs/vitals reviewed - Yes, and within treatment parameters.   Weight within 10% of previous measurement - Yes Oncology Treatment Attestation completed for current therapy- Yes, on date 09/28/2020 Informed consent completed and reflects current therapy/intent - Yes, on date 10/12/2020             Provider progress note reviewed - Yes, today's provider note was reviewed. Treatment/Antibody/Supportive plan reviewed - Yes, and there are no adjustments needed for today's treatment. S&H and other orders reviewed - Yes, and there are no additional orders identified. Previous treatment date reviewed - Yes, and the appropriate amount of time has elapsed between treatments. Clinic Hand Off Received from - No.  Patient to proceed with treatment.

## 2020-12-24 ENCOUNTER — Other Ambulatory Visit: Payer: Self-pay

## 2020-12-24 ENCOUNTER — Inpatient Hospital Stay: Payer: Medicare Other

## 2020-12-24 VITALS — BP 157/72 | HR 79 | Temp 98.6°F | Resp 20

## 2020-12-24 DIAGNOSIS — C20 Malignant neoplasm of rectum: Secondary | ICD-10-CM

## 2020-12-24 DIAGNOSIS — Z5111 Encounter for antineoplastic chemotherapy: Secondary | ICD-10-CM | POA: Diagnosis not present

## 2020-12-24 MED ORDER — SODIUM CHLORIDE 0.9% FLUSH
10.0000 mL | INTRAVENOUS | Status: DC | PRN
  Administered 2020-12-24: 10 mL

## 2020-12-24 MED ORDER — HEPARIN SOD (PORK) LOCK FLUSH 100 UNIT/ML IV SOLN
500.0000 [IU] | Freq: Once | INTRAVENOUS | Status: AC | PRN
  Administered 2020-12-24: 500 [IU]

## 2020-12-24 NOTE — Patient Instructions (Signed)
[facility]  Discharge Instructions: Thank you for choosing [facility] to provide your oncology and hematology care.   If you have a lab appointment with the [facility], please go directly to the [facility] and check in at the registration area.   Wear comfortable clothing and clothing appropriate for easy access to any Portacath or PICC line.   We strive to give you quality time with your provider. You may need to reschedule your appointment if you arrive late (15 or more minutes).  Arriving late affects you and other patients whose appointments are after yours.  Also, if you miss three or more appointments without notifying the office, you may be dismissed from the clinic at the provider's discretion.      For prescription refill requests, have your pharmacy contact our office and allow 72 hours for refills to be completed.    Today you received the following chemotherapy and/or immunotherapy agents 72f  Fluorouracil, 5-FU injection What is this medication? FLUOROURACIL, 5-FU (flure oh YOOR a sil) is a chemotherapy drug. It slows the growth of cancer cells. This medicine is used to treat many types of cancer like breast cancer, colon or rectal cancer, pancreatic cancer, and stomach cancer. This medicine may be used for other purposes; ask your health care provider or pharmacist if you have questions. COMMON BRAND NAME(S): Adrucil What should I tell my care team before I take this medication? They need to know if you have any of these conditions: blood disorders dihydropyrimidine dehydrogenase (DPD) deficiency infection (especially a virus infection such as chickenpox, cold sores, or herpes) kidney disease liver disease malnourished, poor nutrition recent or ongoing radiation therapy an unusual or allergic reaction to fluorouracil, other chemotherapy, other medicines, foods, dyes, or preservatives pregnant or trying to get  pregnant breast-feeding How should I use this medication? This drug is given as an infusion or injection into a vein. It is administered in a hospital or clinic by a specially trained health care professional. Talk to your pediatrician regarding the use of this medicine in children. Special care may be needed. Overdosage: If you think you have taken too much of this medicine contact a poison control center or emergency room at once. NOTE: This medicine is only for you. Do not share this medicine with others. What if I miss a dose? It is important not to miss your dose. Call your doctor or health care professional if you are unable to keep an appointment. What may interact with this medication? Do not take this medicine with any of the following medications: live virus vaccines This medicine may also interact with the following medications: medicines that treat or prevent blood clots like warfarin, enoxaparin, and dalteparin This list may not describe all possible interactions. Give your health care provider a list of all the medicines, herbs, non-prescription drugs, or dietary supplements you use. Also tell them if you smoke, drink alcohol, or use illegal drugs. Some items may interact with your medicine. What should I watch for while using this medication? Visit your doctor for checks on your progress. This drug may make you feel generally unwell. This is not uncommon, as chemotherapy can affect healthy cells as well as cancer cells. Report any side effects. Continue your course of treatment even though you feel ill unless your doctor tells you to stop. In some cases, you may be given additional medicines to help with side effects. Follow all directions for their use. Call your  doctor or health care professional for advice if you get a fever, chills or sore throat, or other symptoms of a cold or flu. Do not treat yourself. This drug decreases your body's ability to fight infections. Try to avoid  being around people who are sick. This medicine may increase your risk to bruise or bleed. Call your doctor or health care professional if you notice any unusual bleeding. Be careful brushing and flossing your teeth or using a toothpick because you may get an infection or bleed more easily. If you have any dental work done, tell your dentist you are receiving this medicine. Avoid taking products that contain aspirin, acetaminophen, ibuprofen, naproxen, or ketoprofen unless instructed by your doctor. These medicines may hide a fever. Do not become pregnant while taking this medicine. Women should inform their doctor if they wish to become pregnant or think they might be pregnant. There is a potential for serious side effects to an unborn child. Talk to your health care professional or pharmacist for more information. Do not breast-feed an infant while taking this medicine. Men should inform their doctor if they wish to father a child. This medicine may lower sperm counts. Do not treat diarrhea with over the counter products. Contact your doctor if you have diarrhea that lasts more than 2 days or if it is severe and watery. This medicine can make you more sensitive to the sun. Keep out of the sun. If you cannot avoid being in the sun, wear protective clothing and use sunscreen. Do not use sun lamps or tanning beds/booths. What side effects may I notice from receiving this medication? Side effects that you should report to your doctor or health care professional as soon as possible: allergic reactions like skin rash, itching or hives, swelling of the face, lips, or tongue low blood counts - this medicine may decrease the number of white blood cells, red blood cells and platelets. You may be at increased risk for infections and bleeding. signs of infection - fever or chills, cough, sore throat, pain or difficulty passing urine signs of decreased platelets or bleeding - bruising, pinpoint red spots on the  skin, black, tarry stools, blood in the urine signs of decreased red blood cells - unusually weak or tired, fainting spells, lightheadedness breathing problems changes in vision chest pain mouth sores nausea and vomiting pain, swelling, redness at site where injected pain, tingling, numbness in the hands or feet redness, swelling, or sores on hands or feet stomach pain unusual bleeding Side effects that usually do not require medical attention (report to your doctor or health care professional if they continue or are bothersome): changes in finger or toe nails diarrhea dry or itchy skin hair loss headache loss of appetite sensitivity of eyes to the light stomach upset unusually teary eyes This list may not describe all possible side effects. Call your doctor for medical advice about side effects. You may report side effects to FDA at 1-800-FDA-1088. Where should I keep my medication? This drug is given in a hospital or clinic and will not be stored at home. NOTE: This sheet is a summary. It may not cover all possible information. If you have questions about this medicine, talk to your doctor, pharmacist, or health care provider.  2022 Elsevier/Gold Standard (2020-10-05 00:00:00)    To help prevent nausea and vomiting after your treatment, we encourage you to take your nausea medication as directed.  BELOW ARE SYMPTOMS THAT SHOULD BE REPORTED IMMEDIATELY: *FEVER GREATER THAN 100.4  F (38 C) OR HIGHER *CHILLS OR SWEATING *NAUSEA AND VOMITING THAT IS NOT CONTROLLED WITH YOUR NAUSEA MEDICATION *UNUSUAL SHORTNESS OF BREATH *UNUSUAL BRUISING OR BLEEDING *URINARY PROBLEMS (pain or burning when urinating, or frequent urination) *BOWEL PROBLEMS (unusual diarrhea, constipation, pain near the anus) TENDERNESS IN MOUTH AND THROAT WITH OR WITHOUT PRESENCE OF ULCERS (sore throat, sores in mouth, or a toothache) UNUSUAL RASH, SWELLING OR PAIN  UNUSUAL VAGINAL DISCHARGE OR ITCHING   Items  with * indicate a potential emergency and should be followed up as soon as possible or go to the Emergency Department if any problems should occur.  Please show the CHEMOTHERAPY ALERT CARD or IMMUNOTHERAPY ALERT CARD at check-in to the Emergency Department and triage nurse.  Should you have questions after your visit or need to cancel or reschedule your appointment, please contact [facility]  Dept: [phone]  and follow the prompts.  Office hours are 8:00 a.m. to 4:30 p.m. Monday - Friday. Please note that voicemails left after 4:00 p.m. may not be returned until the following business day.  We are closed weekends and major holidays. You have access to a nurse at all times for urgent questions. Please call the main number to the clinic Dept: [phone] and follow the prompts.   For any non-urgent questions, you may also contact your provider using MyChart. We now offer e-Visits for anyone 14 and older to request care online for non-urgent symptoms. For details visit [email or web address].   Also download the MyChart app! Go to the app store, search "MyChart", open the app, select [facility], and log in with your MyChart username and password.  Due to Covid, a mask is required upon entering the hospital/clinic. If you do not have a mask, one will be given to you upon arrival. For doctor visits, patients may have 1 support person aged 18 or older with them. For treatment visits, patients cannot have anyone with them due to current Covid guidelines and our immunocompromised population.

## 2021-01-08 ENCOUNTER — Other Ambulatory Visit: Payer: Self-pay | Admitting: Oncology

## 2021-01-11 ENCOUNTER — Other Ambulatory Visit: Payer: Self-pay

## 2021-01-11 ENCOUNTER — Ambulatory Visit (HOSPITAL_BASED_OUTPATIENT_CLINIC_OR_DEPARTMENT_OTHER)
Admission: RE | Admit: 2021-01-11 | Discharge: 2021-01-11 | Disposition: A | Discharge status: Home / Self Care | Payer: Medicare Other | Source: Ambulatory Visit | Attending: Oncology | Admitting: Oncology

## 2021-01-11 DIAGNOSIS — C20 Malignant neoplasm of rectum: Secondary | ICD-10-CM | POA: Diagnosis present

## 2021-01-12 ENCOUNTER — Inpatient Hospital Stay: Payer: Medicare Other | Attending: Oncology

## 2021-01-12 ENCOUNTER — Inpatient Hospital Stay: Payer: Medicare Other

## 2021-01-12 ENCOUNTER — Encounter: Payer: Self-pay | Admitting: Nurse Practitioner

## 2021-01-12 ENCOUNTER — Other Ambulatory Visit: Payer: Self-pay

## 2021-01-12 ENCOUNTER — Inpatient Hospital Stay (HOSPITAL_BASED_OUTPATIENT_CLINIC_OR_DEPARTMENT_OTHER): Payer: Medicare Other | Admitting: Nurse Practitioner

## 2021-01-12 ENCOUNTER — Other Ambulatory Visit (HOSPITAL_BASED_OUTPATIENT_CLINIC_OR_DEPARTMENT_OTHER): Payer: Medicare Other

## 2021-01-12 VITALS — BP 136/62 | HR 67 | Temp 98.2°F | Resp 20

## 2021-01-12 VITALS — BP 150/74 | HR 66 | Temp 98.1°F | Resp 18 | Ht 63.0 in | Wt 214.6 lb

## 2021-01-12 DIAGNOSIS — C20 Malignant neoplasm of rectum: Secondary | ICD-10-CM

## 2021-01-12 DIAGNOSIS — C7801 Secondary malignant neoplasm of right lung: Secondary | ICD-10-CM | POA: Insufficient documentation

## 2021-01-12 DIAGNOSIS — Z5111 Encounter for antineoplastic chemotherapy: Secondary | ICD-10-CM | POA: Insufficient documentation

## 2021-01-12 DIAGNOSIS — C7802 Secondary malignant neoplasm of left lung: Secondary | ICD-10-CM | POA: Diagnosis not present

## 2021-01-12 LAB — CBC WITH DIFFERENTIAL (CANCER CENTER ONLY)
Abs Immature Granulocytes: 0.04 10*3/uL (ref 0.00–0.07)
Basophils Absolute: 0 10*3/uL (ref 0.0–0.1)
Basophils Relative: 1 %
Eosinophils Absolute: 0.1 10*3/uL (ref 0.0–0.5)
Eosinophils Relative: 4 %
HCT: 31.2 % — ABNORMAL LOW (ref 36.0–46.0)
Hemoglobin: 10.6 g/dL — ABNORMAL LOW (ref 12.0–15.0)
Immature Granulocytes: 1 %
Lymphocytes Relative: 21 %
Lymphs Abs: 0.7 10*3/uL (ref 0.7–4.0)
MCH: 34.6 pg — ABNORMAL HIGH (ref 26.0–34.0)
MCHC: 34 g/dL (ref 30.0–36.0)
MCV: 102 fL — ABNORMAL HIGH (ref 80.0–100.0)
Monocytes Absolute: 0.8 10*3/uL (ref 0.1–1.0)
Monocytes Relative: 26 %
Neutro Abs: 1.5 10*3/uL — ABNORMAL LOW (ref 1.7–7.7)
Neutrophils Relative %: 47 %
Platelet Count: 104 10*3/uL — ABNORMAL LOW (ref 150–400)
RBC: 3.06 MIL/uL — ABNORMAL LOW (ref 3.87–5.11)
RDW: 16.1 % — ABNORMAL HIGH (ref 11.5–15.5)
WBC Count: 3.1 10*3/uL — ABNORMAL LOW (ref 4.0–10.5)
nRBC: 0 % (ref 0.0–0.2)

## 2021-01-12 LAB — CMP (CANCER CENTER ONLY)
ALT: 16 U/L (ref 0–44)
AST: 22 U/L (ref 15–41)
Albumin: 3.4 g/dL — ABNORMAL LOW (ref 3.5–5.0)
Alkaline Phosphatase: 59 U/L (ref 38–126)
Anion gap: 9 (ref 5–15)
BUN: 17 mg/dL (ref 8–23)
CO2: 25 mmol/L (ref 22–32)
Calcium: 8.8 mg/dL — ABNORMAL LOW (ref 8.9–10.3)
Chloride: 108 mmol/L (ref 98–111)
Creatinine: 0.88 mg/dL (ref 0.44–1.00)
GFR, Estimated: >60 mL/min (ref >60)
Glucose, Bld: 91 mg/dL (ref 70–99)
Potassium: 3.9 mmol/L (ref 3.5–5.1)
Sodium: 142 mmol/L (ref 135–145)
Total Bilirubin: 0.9 mg/dL (ref 0.3–1.2)
Total Protein: 6 g/dL — ABNORMAL LOW (ref 6.5–8.1)

## 2021-01-12 LAB — MAGNESIUM: Magnesium: 2.1 mg/dL (ref 1.7–2.4)

## 2021-01-12 MED ORDER — SODIUM CHLORIDE 0.9 % IV SOLN
300.0000 mg/m2 | Freq: Once | INTRAVENOUS | Status: AC
  Administered 2021-01-12: 12:00:00 618 mg via INTRAVENOUS
  Filled 2021-01-12: qty 30.9

## 2021-01-12 MED ORDER — ATROPINE SULFATE 1 MG/ML IV SOLN
0.5000 mg | Freq: Once | INTRAVENOUS | Status: AC | PRN
  Administered 2021-01-12: 12:00:00 0.5 mg via INTRAVENOUS
  Filled 2021-01-12: qty 1

## 2021-01-12 MED ORDER — PALONOSETRON HCL INJECTION 0.25 MG/5ML
0.2500 mg | Freq: Once | INTRAVENOUS | Status: AC
  Administered 2021-01-12: 11:00:00 0.25 mg via INTRAVENOUS
  Filled 2021-01-12: qty 5

## 2021-01-12 MED ORDER — SODIUM CHLORIDE 0.9 % IV SOLN: Freq: Once | INTRAVENOUS | Status: AC

## 2021-01-12 MED ORDER — SODIUM CHLORIDE 0.9 % IV SOLN
1800.0000 mg/m2 | INTRAVENOUS | Status: DC
  Administered 2021-01-12: 14:00:00 3700 mg via INTRAVENOUS
  Filled 2021-01-12: qty 74

## 2021-01-12 MED ORDER — DEXAMETHASONE SODIUM PHOSPHATE 10 MG/ML IJ SOLN
5.0000 mg | Freq: Once | INTRAMUSCULAR | Status: AC
  Administered 2021-01-12: 11:00:00 5 mg via INTRAVENOUS
  Filled 2021-01-12: qty 1

## 2021-01-12 MED ORDER — FLUOROURACIL CHEMO INJECTION 2.5 GM/50ML
300.0000 mg/m2 | Freq: Once | INTRAVENOUS | Status: AC
  Administered 2021-01-12: 14:00:00 600 mg via INTRAVENOUS
  Filled 2021-01-12: qty 12

## 2021-01-12 MED ORDER — SODIUM CHLORIDE 0.9 % IV SOLN
135.0000 mg/m2 | Freq: Once | INTRAVENOUS | Status: AC
  Administered 2021-01-12: 12:00:00 280 mg via INTRAVENOUS
  Filled 2021-01-12: qty 14

## 2021-01-12 NOTE — Patient Instructions (Signed)
Samantha Lutz   Discharge Instructions: Thank you for choosing [facility] to provide your oncology and hematology care.   If you have a lab appointment with the [facility], please go directly to the [facility] and check in at the registration area.   Wear comfortable clothing and clothing appropriate for easy access to any Portacath or PICC line.   We strive to give you quality time with your provider. You may need to reschedule your appointment if you arrive late (15 or more minutes).  Arriving late affects you and other patients whose appointments are after yours.  Also, if you miss three or more appointments without notifying the office, you may be dismissed from the clinic at the providers discretion.      For prescription refill requests, have your pharmacy contact our office and allow 72 hours for refills to be completed.    Today you received the following chemotherapy and/or immunotherapy agents Irinotecan (CAMPTOSAR), Leucovorin & Flourouracil (ADRUCIL).       To help prevent nausea and vomiting after your treatment, we encourage you to take your nausea medication as directed.  BELOW ARE SYMPTOMS THAT SHOULD BE REPORTED IMMEDIATELY: *FEVER GREATER THAN 100.4 F (38 C) OR HIGHER *CHILLS OR SWEATING *NAUSEA AND VOMITING THAT IS NOT CONTROLLED WITH YOUR NAUSEA MEDICATION *UNUSUAL SHORTNESS OF BREATH *UNUSUAL BRUISING OR BLEEDING *URINARY PROBLEMS (pain or burning when urinating, or frequent urination) *BOWEL PROBLEMS (unusual diarrhea, constipation, pain near the anus) TENDERNESS IN MOUTH AND THROAT WITH OR WITHOUT PRESENCE OF ULCERS (sore throat, sores in mouth, or a toothache) UNUSUAL RASH, SWELLING OR PAIN  UNUSUAL VAGINAL DISCHARGE OR ITCHING   Items with * indicate a potential emergency and should be followed up as soon as possible or go to the Emergency Department if any problems should occur.  Please show the CHEMOTHERAPY  ALERT CARD or IMMUNOTHERAPY ALERT CARD at check-in to the Emergency Department and triage nurse.  Should you have questions after your visit or need to cancel or reschedule your appointment, please contact [facility]  Dept: [phone]  and follow the prompts.  Office hours are 8:00 a.m. to 4:30 p.m. Monday - Friday. Please note that voicemails left after 4:00 p.m. may not be returned until the following business day.  We are closed weekends and major holidays. You have access to a nurse at all times for urgent questions. Please call the main number to the clinic Dept: [phone] and follow the prompts.   For any non-urgent questions, you may also contact your provider using MyChart. We now offer e-Visits for anyone 83 and older to request care online for non-urgent symptoms. For details visit [email or web address].   Also download the MyChart app! Go to the app store, search "MyChart", open the app, select [facility], and log in with your MyChart username and password.  Due to Covid, a mask is required upon entering the hospital/clinic. If you do not have a mask, one will be given to you upon arrival. For doctor visits, patients may have 1 support person aged 85 or older with them. For treatment visits, patients cannot have anyone with them due to current Covid guidelines and our immunocompromised population.   Irinotecan injection What is this medication? IRINOTECAN (ir in oh TEE kan ) is a chemotherapy drug. It is used to treat colon and rectal cancer. This medicine may be used for other purposes; ask your health care provider or pharmacist if  you have questions. COMMON BRAND NAME(S): Camptosar What should I tell my care team before I take this medication? They need to know if you have any of these conditions: dehydration diarrhea infection (especially a virus infection such as chickenpox, cold sores, or herpes) liver disease low blood counts, like low white  cell, platelet, or red cell counts low levels of calcium, magnesium, or potassium in the blood recent or ongoing radiation therapy an unusual or allergic reaction to irinotecan, other medicines, foods, dyes, or preservatives pregnant or trying to get pregnant breast-feeding How should I use this medication? This drug is given as an infusion into a vein. It is administered in a hospital or clinic by a specially trained health care professional. Talk to your pediatrician regarding the use of this medicine in children. Special care may be needed. Overdosage: If you think you have taken too much of this medicine contact a poison control center or emergency room at once. NOTE: This medicine is only for you. Do not share this medicine with others. What if I miss a dose? It is important not to miss your dose. Call your doctor or health care professional if you are unable to keep an appointment. What may interact with this medication? Do not take this medicine with any of the following medications: cobicistat itraconazole This medicine may interact with the following medications: antiviral medicines for HIV or AIDS certain antibiotics like rifampin or rifabutin certain medicines for fungal infections like ketoconazole, posaconazole, and voriconazole certain medicines for seizures like carbamazepine, phenobarbital, phenotoin clarithromycin gemfibrozil nefazodone [facility]. John's Wort This list may not describe all possible interactions. Give your health care provider a list of all the medicines, herbs, non-prescription drugs, or dietary supplements you use. Also tell them if you smoke, drink alcohol, or use illegal drugs. Some items may interact with your medicine. What should I watch for while using this medication? Your condition will be monitored carefully while you are receiving this medicine. You will need important blood work done while you are taking this medicine. This drug may make you feel  generally unwell. This is not uncommon, as chemotherapy can affect healthy cells as well as cancer cells. Report any side effects. Continue your course of treatment even though you feel ill unless your doctor tells you to stop. In some cases, you may be given additional medicines to help with side effects. Follow all directions for their use. You may get drowsy or dizzy. Do not drive, use machinery, or do anything that needs mental alertness until you know how this medicine affects you. Do not stand or sit up quickly, especially if you are an older patient. This reduces the risk of dizzy or fainting spells. Call your health care professional for advice if you get a fever, chills, or sore throat, or other symptoms of a cold or flu. Do not treat yourself. This medicine decreases your body's ability to fight infections. Try to avoid being around people who are sick. Avoid taking products that contain aspirin, acetaminophen, ibuprofen, naproxen, or ketoprofen unless instructed by your doctor. These medicines may hide a fever. This medicine may increase your risk to bruise or bleed. Call your doctor or health care professional if you notice any unusual bleeding. Be careful brushing and flossing your teeth or using a toothpick because you may get an infection or bleed more easily. If you have any dental work done, tell your dentist you are receiving this medicine. Do not become pregnant while taking this medicine or  for 6 months after stopping it. Women should inform their health care professional if they wish to become pregnant or think they might be pregnant. Men should not father a child while taking this medicine and for 3 months after stopping it. There is potential for serious side effects to an unborn child. Talk to your health care professional for more information. Do not breast-feed an infant while taking this medicine or for 7 days after stopping it. This medicine has caused ovarian failure in some  women. This medicine may make it more difficult to get pregnant. Talk to your health care professional if you are concerned about your fertility. This medicine has caused decreased sperm counts in some men. This may make it more difficult to father a child. Talk to your health care professional if you are concerned about your fertility. What side effects may I notice from receiving this medication? Side effects that you should report to your doctor or health care professional as soon as possible: allergic reactions like skin rash, itching or hives, swelling of the face, lips, or tongue chest pain diarrhea flushing, runny nose, sweating during infusion low blood counts - this medicine may decrease the number of white blood cells, red blood cells and platelets. You may be at increased risk for infections and bleeding. nausea, vomiting pain, swelling, warmth in the leg signs of decreased platelets or bleeding - bruising, pinpoint red spots on the skin, black, tarry stools, blood in the urine signs of infection - fever or chills, cough, sore throat, pain or difficulty passing urine signs of decreased red blood cells - unusually weak or tired, fainting spells, lightheadedness Side effects that usually do not require medical attention (report to your doctor or health care professional if they continue or are bothersome): constipation hair loss headache loss of appetite mouth sores stomach pain This list may not describe all possible side effects. Call your doctor for medical advice about side effects. You may report side effects to FDA at 1-800-FDA-1088. Where should I keep my medication? This drug is given in a hospital or clinic and will not be stored at home. NOTE: This sheet is a summary. It may not cover all possible information. If you have questions about this medicine, talk to your doctor, pharmacist, or health care provider.  2022 Elsevier/Gold Standard (2020-10-05  00:00:00)  Leucovorin injection What is this medication? LEUCOVORIN (loo koe VOR in) is used to prevent or treat the harmful effects of some medicines. This medicine is used to treat anemia caused by a low amount of folic acid in the body. It is also used with 5-fluorouracil (5-FU) to treat colon cancer. This medicine may be used for other purposes; ask your health care provider or pharmacist if you have questions. What should I tell my care team before I take this medication? They need to know if you have any of these conditions: anemia from low levels of vitamin B-12 in the blood an unusual or allergic reaction to leucovorin, folic acid, other medicines, foods, dyes, or preservatives pregnant or trying to get pregnant breast-feeding How should I use this medication? This medicine is for injection into a muscle or into a vein. It is given by a health care professional in a hospital or clinic setting. Talk to your pediatrician regarding the use of this medicine in children. Special care may be needed. Overdosage: If you think you have taken too much of this medicine contact a poison control center or emergency room at once. NOTE:  This medicine is only for you. Do not share this medicine with others. What if I miss a dose? This does not apply. What may interact with this medication? capecitabine fluorouracil phenobarbital phenytoin primidone trimethoprim-sulfamethoxazole This list may not describe all possible interactions. Give your health care provider a list of all the medicines, herbs, non-prescription drugs, or dietary supplements you use. Also tell them if you smoke, drink alcohol, or use illegal drugs. Some items may interact with your medicine. What should I watch for while using this medication? Your condition will be monitored carefully while you are receiving this medicine. This medicine may increase the side effects of 5-fluorouracil, 5-FU. Tell your doctor or health care  professional if you have diarrhea or mouth sores that do not get better or that get worse. What side effects may I notice from receiving this medication? Side effects that you should report to your doctor or health care professional as soon as possible: allergic reactions like skin rash, itching or hives, swelling of the face, lips, or tongue breathing problems fever, infection mouth sores unusual bleeding or bruising unusually weak or tired Side effects that usually do not require medical attention (report to your doctor or health care professional if they continue or are bothersome): constipation or diarrhea loss of appetite nausea, vomiting This list may not describe all possible side effects. Call your doctor for medical advice about side effects. You may report side effects to FDA at 1-800-FDA-1088. Where should I keep my medication? This drug is given in a hospital or clinic and will not be stored at home. NOTE: This sheet is a summary. It may not cover all possible information. If you have questions about this medicine, talk to your doctor, pharmacist, or health care provider.  2022 Elsevier/Gold Standard (2007-07-25 00:00:00)  Fluorouracil, 5-FU injection What is this medication? FLUOROURACIL, 5-FU (flure oh YOOR a sil) is a chemotherapy drug. It slows the growth of cancer cells. This medicine is used to treat many types of cancer like breast cancer, colon or rectal cancer, pancreatic cancer, and stomach cancer. This medicine may be used for other purposes; ask your health care provider or pharmacist if you have questions. COMMON BRAND NAME(S): Adrucil What should I tell my care team before I take this medication? They need to know if you have any of these conditions: blood disorders dihydropyrimidine dehydrogenase (DPD) deficiency infection (especially a virus infection such as chickenpox, cold sores, or herpes) kidney disease liver disease malnourished, poor  nutrition recent or ongoing radiation therapy an unusual or allergic reaction to fluorouracil, other chemotherapy, other medicines, foods, dyes, or preservatives pregnant or trying to get pregnant breast-feeding How should I use this medication? This drug is given as an infusion or injection into a vein. It is administered in a hospital or clinic by a specially trained health care professional. Talk to your pediatrician regarding the use of this medicine in children. Special care may be needed. Overdosage: If you think you have taken too much of this medicine contact a poison control center or emergency room at once. NOTE: This medicine is only for you. Do not share this medicine with others. What if I miss a dose? It is important not to miss your dose. Call your doctor or health care professional if you are unable to keep an appointment. What may interact with this medication? Do not take this medicine with any of the following medications: live virus vaccines This medicine may also interact with the following medications: medicines that  treat or prevent blood clots like warfarin, enoxaparin, and dalteparin This list may not describe all possible interactions. Give your health care provider a list of all the medicines, herbs, non-prescription drugs, or dietary supplements you use. Also tell them if you smoke, drink alcohol, or use illegal drugs. Some items may interact with your medicine. What should I watch for while using this medication? Visit your doctor for checks on your progress. This drug may make you feel generally unwell. This is not uncommon, as chemotherapy can affect healthy cells as well as cancer cells. Report any side effects. Continue your course of treatment even though you feel ill unless your doctor tells you to stop. In some cases, you may be given additional medicines to help with side effects. Follow all directions for their use. Call your doctor or health care  professional for advice if you get a fever, chills or sore throat, or other symptoms of a cold or flu. Do not treat yourself. This drug decreases your body's ability to fight infections. Try to avoid being around people who are sick. This medicine may increase your risk to bruise or bleed. Call your doctor or health care professional if you notice any unusual bleeding. Be careful brushing and flossing your teeth or using a toothpick because you may get an infection or bleed more easily. If you have any dental work done, tell your dentist you are receiving this medicine. Avoid taking products that contain aspirin, acetaminophen, ibuprofen, naproxen, or ketoprofen unless instructed by your doctor. These medicines may hide a fever. Do not become pregnant while taking this medicine. Women should inform their doctor if they wish to become pregnant or think they might be pregnant. There is a potential for serious side effects to an unborn child. Talk to your health care professional or pharmacist for more information. Do not breast-feed an infant while taking this medicine. Men should inform their doctor if they wish to father a child. This medicine may lower sperm counts. Do not treat diarrhea with over the counter products. Contact your doctor if you have diarrhea that lasts more than 2 days or if it is severe and watery. This medicine can make you more sensitive to the sun. Keep out of the sun. If you cannot avoid being in the sun, wear protective clothing and use sunscreen. Do not use sun lamps or tanning beds/booths. What side effects may I notice from receiving this medication? Side effects that you should report to your doctor or health care professional as soon as possible: allergic reactions like skin rash, itching or hives, swelling of the face, lips, or tongue low blood counts - this medicine may decrease the number of white blood cells, red blood cells and platelets. You may be at increased risk for  infections and bleeding. signs of infection - fever or chills, cough, sore throat, pain or difficulty passing urine signs of decreased platelets or bleeding - bruising, pinpoint red spots on the skin, black, tarry stools, blood in the urine signs of decreased red blood cells - unusually weak or tired, fainting spells, lightheadedness breathing problems changes in vision chest pain mouth sores nausea and vomiting pain, swelling, redness at site where injected pain, tingling, numbness in the hands or feet redness, swelling, or sores on hands or feet stomach pain unusual bleeding Side effects that usually do not require medical attention (report to your doctor or health care professional if they continue or are bothersome): changes in finger or toe nails diarrhea dry  or itchy skin hair loss headache loss of appetite sensitivity of eyes to the light stomach upset unusually teary eyes This list may not describe all possible side effects. Call your doctor for medical advice about side effects. You may report side effects to FDA at 1-800-FDA-1088. Where should I keep my medication? This drug is given in a hospital or clinic and will not be stored at home. NOTE: This sheet is a summary. It may not cover all possible information. If you have questions about this medicine, talk to your doctor, pharmacist, or health care provider.  2022 Elsevier/Gold Standard (2020-10-05 00:00:00)  The chemotherapy medication bag should finish at 46 hours, 96 hours, or 7 days. For example, if your pump is scheduled for 46 hours and it was put on at 4:00 p.m., it should finish at 2:00 p.m. the day it is scheduled to come off regardless of your appointment time.     Estimated time to finish at 11:45 a.m. on Friday 01/14/2021.   If the display on your pump reads "Low Volume" and it is beeping, take the batteries out of the pump and come to the cancer center for it to be taken off.   If the pump alarms go off  prior to the pump reading "Low Volume" then call [phone] and someone can assist you.  If the plunger comes out and the chemotherapy medication is leaking out, please use your home chemo spill kit to clean up the spill. Do NOT use paper towels or other household products.  If you have problems or questions regarding your pump, please call either [phone] (24 hours a day) or the cancer center Monday-Friday 8:00 a.m.- 4:30 p.m. at the clinic number and we will assist you. If you are unable to get assistance, then go to the nearest Emergency Department and ask the staff to contact the IV team for assistance.

## 2021-01-12 NOTE — Progress Notes (Signed)
Patient seen by Lisa Thomas NP today  Vitals are within treatment parameters.  Labs reviewed by Lisa Thomas NP and are within treatment parameters.  Per physician team, patient is ready for treatment and there are NO modifications to the treatment plan.     

## 2021-01-12 NOTE — Progress Notes (Signed)
[facility] OFFICE PROGRESS NOTE   Diagnosis: Rectal cancer  INTERVAL HISTORY:   Ms. Ehrler returns as scheduled.  She completed cycle 4 FOLFIRI 12/22/2020.  She denies nausea/vomiting.  No mouth sores.  No diarrhea.  For the past 2 cycles she has developed burning over the labia/perineum for several days after each treatment.  She applies A&E ointment with good relief.  Objective:  Vital signs in last 24 hours:  Blood pressure (!) 150/74, pulse 66, temperature 98.1 F (36.7 C), temperature source Oral, resp. rate 18, height '5\' 3"'  (1.6 m), weight 214 lb 9.6 oz (97.3 kg), SpO2 100 %.    HEENT: No thrush or ulcers. Resp: Decreased breath sounds right lower chest.  No respiratory distress. Cardio: Regular rate and rhythm. GI: Abdomen soft and nontender.  No hepatosplenomegaly. Vascular: No leg edema. Skin: Palms without erythema. Port-A-Cath without erythema.   Lab Results:  Lab Results  Component Value Date   WBC 3.1 (L) 01/12/2021   HGB 10.6 (L) 01/12/2021   HCT 31.2 (L) 01/12/2021   MCV 102.0 (H) 01/12/2021   PLT 104 (L) 01/12/2021   NEUTROABS 1.5 (L) 01/12/2021    Imaging:  CT Chest Wo Contrast  Result Date: 01/11/2021 CLINICAL DATA:  Colorectal cancer with lung metastasis.  Restaging. EXAM: CT CHEST WITHOUT CONTRAST TECHNIQUE: Multidetector CT imaging of the chest was performed following the standard protocol without IV contrast. COMPARISON:  09/25/2019 FINDINGS: Cardiovascular: Right Port-A-Cath tip mid right atrium. Aortic atherosclerosis. Mild cardiomegaly, without pericardial effusion. Lad coronary artery calcification. Mediastinum/Nodes: No supraclavicular adenopathy. No mediastinal or definite hilar adenopathy, given limitations of unenhanced CT. Periesophageal varices. Lungs/Pleura: Trace right pleural fluid and thickening are similar. Again identified are medial right lower lobe areas of patchy consolidation, architectural distortion, and  traction bronchiectasis, which are presumably radiation induced. Areas of peribronchovascular reticulonodular opacity in the right upper lobe are improved. Residual right upper lobe 5 mm nodule on 39/4 was likely similar on the prior but obscured by more diffuse nodularity. A right lower lobe nodule, with in the presumed radiation fibrosis, measures 1.4 x 1.5 cm on 78/4 and is similar to 1.4 x 1.4 cm on the prior exam (when remeasured). Left upper lobe pulmonary nodule of 7 x 7 mm on 56/4 measured 8 x 11 mm on the prior exam (when remeasured). Posterior left upper lobe nodule measures 9 x 8 mm on 72/4 and is similar to 9 x 7 mm on the prior exam (when remeasured). Upper Abdomen: Multiple gallstones. Intrahepatic biliary duct dilatation within the lateral segment left lobe is grossly similar, but suboptimally evaluated. Concurrent mild segmental atrophy. Suspect splenomegaly, incompletely imaged. Normal imaged portions of the stomach, pancreas, adrenal glands, right kidney. Musculoskeletal: No acute osseous abnormality. Right hemidiaphragm elevation. IMPRESSION: 1. Primarily similar bilateral pulmonary nodules. A left upper lobe nodule has mildly decreased in size. No new nodules identified. 2. Improvement in right upper lobe peribronchovascular reticulonodular opacities which may represent evolving radiation change. Medial right lower lobe radiation induced consolidation is relatively similar. 3. No thoracic adenopathy. 4. Cholelithiasis 5. Suboptimal evaluation of previously described lateral segment left liver lobe atrophy and biliary duct dilatation on this noncontrast exam. 6. Coronary artery atherosclerosis. Aortic Atherosclerosis (ICD10-I70.0). Electronically Signed   By: Abigail Miyamoto M.D.   On: 01/11/2021 21:02    Medications: I have reviewed the patient's current medications.  Assessment/Plan: Rectal cancer Mass at 7 cm from the anal verge on colonoscopy 08/09/2017, biopsy revealed invasive  adenocarcinoma Staging CTs  08/17/2017-no evidence of metastatic disease, asymmetric thickening in the mid rectum MR pelvis 09/01/2017, T3N0 lesion beginning at 6.3 cm from the anal sphincter Radiation/Xeloda initiated 09/17/2017, completed 10/25/2017 Low anterior resection/diverting ileostomy 12/21/2017,ypT3,ypN1a tumor.  Lymphovascular invasion present, intact mismatch repair protein expression, treatment effect present (TRS 1).  Mismatch repair protein IHC normal; Foundation 1-KRAS/NRAS wild-type, microsatellite status and tumor mutational burden could not be determined. Cycle 1 adjuvant Xeloda beginning 01/21/2018 Cycle 2 adjuvant Xeloda beginning 02/11/2018 Xeloda discontinued after cycle 2 secondary to patient preference Ileostomy takedown 07/10/2018 CTs 09/07/2018- multiple live small pulmonary nodules concerning for metastatic disease CT chest 12/10/2018-multiple bilateral lung nodules, some have increased in size CT chest 04/08/2019-mild enlargement of bilateral lung nodules Status post SBRT multiple lung nodules 05/06/2019, 05/08/2019, 05/13/2019 CT chest 08/28/2019-improvement and resolution in majority of right-sided pulmonary nodules, a superior segment right lower lobe nodule has increased, no new right-sided nodules.  Progressive enlargement of left-sided pulmonary nodules PET scan 09/09/2019-hypermetabolic right lower lobe nodule, 2 hypermetabolic left upper lobe nodules with an additional 0.7 cm left upper lobe nodule below PET resolution, groundglass opacity in the mid to right lower lobe with associated hypermetabolism consistent with postradiation change, hypermetabolic central segment 4A liver lesion Cycle 1 FOLFOX 10/14/2019 Cycle  2FOLFOX 11/11/2019, 5-FU and oxaliplatin dose reduced secondary to neutropenia and thrombocytopenia following cycle one, G-CSF declined Cycle 3 FOLFOX 11/26/2019 Cycle 4 FOLFOX 12/11/2019, oxaliplatin held due to neutropenia and thrombocytopenia Cycle 5 FOLFOX  12/31/2019 Cycle 6 FOLFOX 01/14/2020 (oxaliplatin held, 5-fluorouracil dose reduced due to mucositis) CT chest 01/27/2020-improvement in left lung nodules, progressive airspace disease with traction bronchiectasis throughout the right lung, previously noted right lung nodules are obscured, mildly enlarged right paratracheal lymph nodes-not hypermetabolic on prior PET, likely reactive Cycle 7 FOLFOX 01/28/2020 Cycle 8 FOLFOX 02/11/2020 (oxaliplatin held due to neutropenia and thrombocytopenia) Cycle 9 FOLFOX 02/25/2020 (oxaliplatin held due to thrombocytopenia) Cycle 10 FOLFOX 03/11/2020 (oxaliplatin held secondary to neuropathy) Cycle 11 FOLFOX 03/25/2020 (oxaliplatin held due to neuropathy) CT chest 04/05/2020-no new or progressive findings.  Interval evolution of presumed postradiation scarring in the right perihilar lung with decrease in the more diffuse groundglass opacity seen previously.  No substantial change in left lung nodules. 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  No definite CT findings to suggest recurrent tumor.  Stable small left upper lobe pulmonary nodules.  No new or progressive findings.  Stable small right paratracheal lymph nodes.  Stable area of irregular low-attenuation in hepatic segment 4A, site of known prior hepatic metastatic lesion.  No new or progressive findings. Cycle 18 5-fluorouracil 07/06/2020 Cycle 19 5-fluorouracil 07/20/2020 Cycle 20 5-fluorouracil 08/03/2020 Cycle 21 5-fluorouracil 08/17/2020  Cycle 22 5-fluorouracil 08/31/2020 Cycle 23 5-fluorouracil 09/14/2020 CT chest 09/24/2020-increased number and size of pulmonary nodules bilaterally.  Findings in the right upper lobe  are concerning for potential lymphangitic spread of tumor.  Right upper lobe findings could also reflect evolving postradiation change. Cycle 1 FOLFIRI 10/12/2020 Cycle 2 FOLFIRI 11/08/2020, irinotecan dose reduced due to neutropenia and thrombocytopenia following cycle 1 Cycle 3 FOLFIRI 12/01/2020 Cycle 4 FOLFIRI 12/22/2020 CT chest 01/11/2021-similar bilateral pulmonary nodules.  Left upper lobe nodule has mildly decreased in size.  No new nodules.   2.   Hypothyroid   3.    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  Port-A-Cath placement 09/29/2019, interventional radiology   5.  Neutropenia and thrombocytopenia following cycle 1 FOLFOX-plan chemotherapy dose reductions, she declined G-CSF   6.  Mucositis following cycle 5 FOLFOX, 5-fluorouracil dose reduced with cycle 6 Mucositis following cycle 22 5-fluorouracil-Magic mouthwash added   7.  Right lung airspace disease/volume loss-likely toxicity from chest radiation, trial of prednisone 01/29/2020; dyspnea and cough improved 02/11/2020 Progressive cough 03/11/2020-prednisone resumed at a dose of 20 mg daily Cough and dyspnea improved 03/25/2020-prednisone taper to 15 mg daily Improved 04/06/2020-prednisone taper to 10 mg daily Stable 04/21/2020-prednisone taper to 5 mg daily    Disposition: Ms. Gronewold appears well.  She has completed 4 cycles of FOLFIRI.  Recent restaging chest CT shows stable to improved disease.  Report/images reviewed with her at today's visit.  Dr. Benay Spice recommends continuation of FOLFIRI.  She agrees with this plan, cycle 5 FOLFIRI today.  The burning over the labia/perineum may be mucositis related to 5-fluorouracil.  Symptoms resolved over a few days.  She will continue A&E ointment as needed.  CBC and chemistry panel from today reviewed.  Labs adequate to proceed with treatment.  She has mild neutropenia.  She understands to contact the office with fever, chills, other signs  of infection.  She will return for lab, follow-up, FOLFIRI in 3 weeks.  We are available to see her sooner if needed.  Patient seen with Dr. Benay Spice.    Ned Card ANP/GNP-BC   01/12/2021  10:06 AM  This was a shared visit with Ned Card.  Reviewed the restaging CT results and images with Ms. Fannin.  The CT is consistent with stable versus mildly improved disease.  No evidence of disease progression.  The plan is to continue FOLFIRI.  I was present for greater than 50% of today's visit.  I performed medical decision making.  Julieanne Manson, MD

## 2021-01-12 NOTE — Progress Notes (Signed)
Patient presents for treatment. RN assessment completed along with the following:  Labs/vitals reviewed - Yes, and within treatment parameters.   Weight within 10% of previous measurement - Yes Oncology Treatment Attestation completed for current therapy- Yes, on date 09/28/2020 Informed consent completed and reflects current therapy/intent - Yes, on date 10/12/2020             Provider progress note reviewed - Yes, today's provider note was reviewed. Treatment/Antibody/Supportive plan reviewed - Yes, and there are no adjustments needed for today's treatment. S&H and other orders reviewed - Yes, and there are no additional orders identified. Previous treatment date reviewed - Yes, and the appropriate amount of time has elapsed between treatments. Clinic Hand Off Received from - Yes from [place], NP.  Patient to proceed with treatment.

## 2021-01-13 ENCOUNTER — Telehealth: Payer: Self-pay | Admitting: Oncology

## 2021-01-13 NOTE — Telephone Encounter (Signed)
Scheduled appt per 12/14 los - unable to reach patient , left message for patient with appt date and time

## 2021-01-14 ENCOUNTER — Other Ambulatory Visit: Payer: Self-pay

## 2021-01-14 ENCOUNTER — Inpatient Hospital Stay: Payer: Medicare Other

## 2021-01-14 VITALS — BP 151/75 | HR 76 | Temp 98.2°F | Resp 18

## 2021-01-14 DIAGNOSIS — Z5111 Encounter for antineoplastic chemotherapy: Secondary | ICD-10-CM | POA: Diagnosis not present

## 2021-01-14 DIAGNOSIS — C20 Malignant neoplasm of rectum: Secondary | ICD-10-CM

## 2021-01-14 MED ORDER — HEPARIN SOD (PORK) LOCK FLUSH 100 UNIT/ML IV SOLN
500.0000 [IU] | Freq: Once | INTRAVENOUS | Status: AC | PRN
  Administered 2021-01-14: 500 [IU]

## 2021-01-14 MED ORDER — SODIUM CHLORIDE 0.9% FLUSH
10.0000 mL | INTRAVENOUS | Status: DC | PRN
  Administered 2021-01-14: 10 mL

## 2021-01-14 NOTE — Patient Instructions (Signed)

## 2021-01-19 ENCOUNTER — Other Ambulatory Visit: Payer: Medicare Other

## 2021-01-19 ENCOUNTER — Ambulatory Visit: Payer: Medicare Other

## 2021-01-19 ENCOUNTER — Ambulatory Visit: Payer: Medicare Other | Admitting: Nurse Practitioner

## 2021-01-31 ENCOUNTER — Other Ambulatory Visit: Payer: Self-pay | Admitting: Oncology

## 2021-02-03 ENCOUNTER — Other Ambulatory Visit: Payer: Self-pay

## 2021-02-03 ENCOUNTER — Inpatient Hospital Stay: Payer: Medicare Other

## 2021-02-03 ENCOUNTER — Encounter: Payer: Self-pay | Admitting: Nurse Practitioner

## 2021-02-03 ENCOUNTER — Inpatient Hospital Stay: Payer: Medicare Other | Attending: Oncology

## 2021-02-03 ENCOUNTER — Inpatient Hospital Stay (HOSPITAL_BASED_OUTPATIENT_CLINIC_OR_DEPARTMENT_OTHER): Payer: Medicare Other | Admitting: Nurse Practitioner

## 2021-02-03 VITALS — BP 170/80 | HR 88 | Temp 98.1°F | Resp 20 | Ht 63.0 in | Wt 215.4 lb

## 2021-02-03 VITALS — BP 143/64 | HR 70

## 2021-02-03 DIAGNOSIS — C20 Malignant neoplasm of rectum: Secondary | ICD-10-CM

## 2021-02-03 DIAGNOSIS — Z5111 Encounter for antineoplastic chemotherapy: Secondary | ICD-10-CM | POA: Insufficient documentation

## 2021-02-03 LAB — CMP (CANCER CENTER ONLY)
ALT: 16 U/L (ref 0–44)
AST: 21 U/L (ref 15–41)
Albumin: 3.4 g/dL — ABNORMAL LOW (ref 3.5–5.0)
Alkaline Phosphatase: 56 U/L (ref 38–126)
Anion gap: 7 (ref 5–15)
BUN: 15 mg/dL (ref 8–23)
CO2: 26 mmol/L (ref 22–32)
Calcium: 8.4 mg/dL — ABNORMAL LOW (ref 8.9–10.3)
Chloride: 109 mmol/L (ref 98–111)
Creatinine: 0.78 mg/dL (ref 0.44–1.00)
GFR, Estimated: >60 mL/min (ref >60)
Glucose, Bld: 89 mg/dL (ref 70–99)
Potassium: 3.7 mmol/L (ref 3.5–5.1)
Sodium: 142 mmol/L (ref 135–145)
Total Bilirubin: 0.7 mg/dL (ref 0.3–1.2)
Total Protein: 5.9 g/dL — ABNORMAL LOW (ref 6.5–8.1)

## 2021-02-03 LAB — CBC WITH DIFFERENTIAL (CANCER CENTER ONLY)
Abs Immature Granulocytes: 0.06 10*3/uL (ref 0.00–0.07)
Basophils Absolute: 0 10*3/uL (ref 0.0–0.1)
Basophils Relative: 1 %
Eosinophils Absolute: 0.2 10*3/uL (ref 0.0–0.5)
Eosinophils Relative: 4 %
HCT: 30.8 % — ABNORMAL LOW (ref 36.0–46.0)
Hemoglobin: 10.5 g/dL — ABNORMAL LOW (ref 12.0–15.0)
Immature Granulocytes: 2 %
Lymphocytes Relative: 21 %
Lymphs Abs: 0.8 10*3/uL (ref 0.7–4.0)
MCH: 34.4 pg — ABNORMAL HIGH (ref 26.0–34.0)
MCHC: 34.1 g/dL (ref 30.0–36.0)
MCV: 101 fL — ABNORMAL HIGH (ref 80.0–100.0)
Monocytes Absolute: 0.8 10*3/uL (ref 0.1–1.0)
Monocytes Relative: 21 %
Neutro Abs: 1.8 10*3/uL (ref 1.7–7.7)
Neutrophils Relative %: 51 %
Platelet Count: 103 10*3/uL — ABNORMAL LOW (ref 150–400)
RBC: 3.05 MIL/uL — ABNORMAL LOW (ref 3.87–5.11)
RDW: 15.2 % (ref 11.5–15.5)
WBC Count: 3.6 10*3/uL — ABNORMAL LOW (ref 4.0–10.5)
nRBC: 0 % (ref 0.0–0.2)

## 2021-02-03 MED ORDER — SODIUM CHLORIDE 0.9 % IV SOLN: Freq: Once | INTRAVENOUS | Status: AC

## 2021-02-03 MED ORDER — DEXAMETHASONE SODIUM PHOSPHATE 10 MG/ML IJ SOLN
5.0000 mg | Freq: Once | INTRAMUSCULAR | Status: AC
  Administered 2021-02-03: 5 mg via INTRAVENOUS
  Filled 2021-02-03: qty 1

## 2021-02-03 MED ORDER — SODIUM CHLORIDE 0.9 % IV SOLN
1800.0000 mg/m2 | INTRAVENOUS | Status: DC
  Administered 2021-02-03: 3700 mg via INTRAVENOUS
  Filled 2021-02-03: qty 74

## 2021-02-03 MED ORDER — SODIUM CHLORIDE 0.9 % IV SOLN
300.0000 mg/m2 | Freq: Once | INTRAVENOUS | Status: AC
  Administered 2021-02-03: 618 mg via INTRAVENOUS
  Filled 2021-02-03: qty 30.9

## 2021-02-03 MED ORDER — PREDNISONE 5 MG PO TABS: 5.0000 mg | ORAL_TABLET | Freq: Every day | ORAL | 2 refills | Status: DC

## 2021-02-03 MED ORDER — ATROPINE SULFATE 1 MG/ML IV SOLN
0.5000 mg | Freq: Once | INTRAVENOUS | Status: AC | PRN
  Administered 2021-02-03: 0.5 mg via INTRAVENOUS
  Filled 2021-02-03: qty 1

## 2021-02-03 MED ORDER — FLUOROURACIL CHEMO INJECTION 2.5 GM/50ML
300.0000 mg/m2 | Freq: Once | INTRAVENOUS | Status: AC
  Administered 2021-02-03: 600 mg via INTRAVENOUS
  Filled 2021-02-03: qty 12

## 2021-02-03 MED ORDER — SODIUM CHLORIDE 0.9 % IV SOLN
135.0000 mg/m2 | Freq: Once | INTRAVENOUS | Status: AC
  Administered 2021-02-03: 280 mg via INTRAVENOUS
  Filled 2021-02-03: qty 14

## 2021-02-03 MED ORDER — PALONOSETRON HCL INJECTION 0.25 MG/5ML
0.2500 mg | Freq: Once | INTRAVENOUS | Status: AC
  Administered 2021-02-03: 0.25 mg via INTRAVENOUS
  Filled 2021-02-03: qty 5

## 2021-02-03 NOTE — Progress Notes (Signed)
Patient presents for treatment. RN assessment completed along with the following:  Labs/vitals reviewed - Yes, and within treatment parameters.   Weight within 10% of previous measurement - Yes Oncology Treatment Attestation completed for current therapy- Yes, on date 09/28/20 Informed consent completed and reflects current therapy/intent - Yes, on date 10/12/20             Provider progress note reviewed - Yes, today's provider note was reviewed. Treatment/Antibody/Supportive plan reviewed - Yes, and there are no adjustments needed for today's treatment. S&H and other orders reviewed - Yes, and there are no additional orders identified. Previous treatment date reviewed - Yes, and the appropriate amount of time has elapsed between treatments. Clinic Hand Off Received from - none  Patient to proceed with treatment.

## 2021-02-03 NOTE — Progress Notes (Signed)
Patient seen by Lisa Thomas NP today  Vitals are within treatment parameters.  Labs reviewed by Lisa Thomas NP and are within treatment parameters.  Per physician team, patient is ready for treatment and there are NO modifications to the treatment plan.     

## 2021-02-03 NOTE — Progress Notes (Signed)
[place] OFFICE PROGRESS NOTE   Diagnosis: Rectal cancer  INTERVAL HISTORY:   Ms. Samantha Lutz returns as scheduled.  She completed cycle 5 FOLFIRI 01/12/2021.  She denies nausea/vomiting.  No mouth sores.  No diarrhea.  Stable dyspnea on exertion.  Objective:  Vital signs in last 24 hours:  Blood pressure (!) 170/80, pulse 88, temperature 98.1 F (36.7 C), temperature source Oral, resp. rate 20, height '5\' 3"'  (1.6 m), weight 215 lb 6.4 oz (97.7 kg), SpO2 100 %.    HEENT: No thrush or ulcers. Resp: Breath sounds diminished right lower lung field.  No respiratory distress. Cardio: Regular rate and rhythm. GI: Abdomen soft and nontender.  No hepatomegaly. Vascular: No leg edema. Skin: A few ecchymoses scattered over the forearms. Port-A-Cath without erythema.   Lab Results:  Lab Results  Component Value Date   WBC 3.6 (L) 02/03/2021   HGB 10.5 (L) 02/03/2021   HCT 30.8 (L) 02/03/2021   MCV 101.0 (H) 02/03/2021   PLT 103 (L) 02/03/2021   NEUTROABS 1.8 02/03/2021    Imaging:  No results found.  Medications: I have reviewed the patient's current medications.  Assessment/Plan: Rectal cancer Mass at 7 cm from the anal verge on colonoscopy 08/09/2017, biopsy revealed invasive adenocarcinoma Staging CTs 08/17/2017-no evidence of metastatic disease, asymmetric thickening in the mid rectum MR pelvis 09/01/2017, T3N0 lesion beginning at 6.3 cm from the anal sphincter Radiation/Xeloda initiated 09/17/2017, completed 10/25/2017 Low anterior resection/diverting ileostomy 12/21/2017,ypT3,ypN1a tumor.  Lymphovascular invasion present, intact mismatch repair protein expression, treatment effect present (TRS 1).  Mismatch repair protein IHC normal; Foundation 1-KRAS/NRAS wild-type, microsatellite status and tumor mutational burden could not be determined. Cycle 1 adjuvant Xeloda beginning 01/21/2018 Cycle 2 adjuvant Xeloda beginning 02/11/2018 Xeloda discontinued after  cycle 2 secondary to patient preference Ileostomy takedown 07/10/2018 CTs 09/07/2018- multiple live small pulmonary nodules concerning for metastatic disease CT chest 12/10/2018-multiple bilateral lung nodules, some have increased in size CT chest 04/08/2019-mild enlargement of bilateral lung nodules Status post SBRT multiple lung nodules 05/06/2019, 05/08/2019, 05/13/2019 CT chest 08/28/2019-improvement and resolution in majority of right-sided pulmonary nodules, a superior segment right lower lobe nodule has increased, no new right-sided nodules.  Progressive enlargement of left-sided pulmonary nodules PET scan 09/09/2019-hypermetabolic right lower lobe nodule, 2 hypermetabolic left upper lobe nodules with an additional 0.7 cm left upper lobe nodule below PET resolution, groundglass opacity in the mid to right lower lobe with associated hypermetabolism consistent with postradiation change, hypermetabolic central segment 4A liver lesion Cycle 1 FOLFOX 10/14/2019 Cycle  2FOLFOX 11/11/2019, 5-FU and oxaliplatin dose reduced secondary to neutropenia and thrombocytopenia following cycle one, G-CSF declined Cycle 3 FOLFOX 11/26/2019 Cycle 4 FOLFOX 12/11/2019, oxaliplatin held due to neutropenia and thrombocytopenia Cycle 5 FOLFOX 12/31/2019 Cycle 6 FOLFOX 01/14/2020 (oxaliplatin held, 5-fluorouracil dose reduced due to mucositis) CT chest 01/27/2020-improvement in left lung nodules, progressive airspace disease with traction bronchiectasis throughout the right lung, previously noted right lung nodules are obscured, mildly enlarged right paratracheal lymph nodes-not hypermetabolic on prior PET, likely reactive Cycle 7 FOLFOX 01/28/2020 Cycle 8 FOLFOX 02/11/2020 (oxaliplatin held due to neutropenia and thrombocytopenia) Cycle 9 FOLFOX 02/25/2020 (oxaliplatin held due to thrombocytopenia) Cycle 10 FOLFOX 03/11/2020 (oxaliplatin held secondary to neuropathy) Cycle 11 FOLFOX 03/25/2020 (oxaliplatin held due to  neuropathy) CT chest 04/05/2020-no new or progressive findings.  Interval evolution of presumed postradiation scarring in the right perihilar lung with decrease in the more diffuse groundglass opacity seen previously.  No substantial change in left lung nodules. 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  No definite CT findings to suggest recurrent tumor.  Stable small left upper lobe pulmonary nodules.  No new or progressive findings.  Stable small right paratracheal lymph nodes.  Stable area of irregular low-attenuation in hepatic segment 4A, site of known prior hepatic metastatic lesion.  No new or progressive findings. Cycle 18 5-fluorouracil 07/06/2020 Cycle 19 5-fluorouracil 07/20/2020 Cycle 20 5-fluorouracil 08/03/2020 Cycle 21 5-fluorouracil 08/17/2020  Cycle 22 5-fluorouracil 08/31/2020 Cycle 23 5-fluorouracil 09/14/2020 CT chest 09/24/2020-increased number and size of pulmonary nodules bilaterally.  Findings in the right upper lobe are concerning for potential lymphangitic spread of tumor.  Right upper lobe findings could also reflect evolving postradiation change. Cycle 1 FOLFIRI 10/12/2020 Cycle 2 FOLFIRI 11/08/2020, irinotecan dose reduced due to neutropenia and thrombocytopenia following cycle 1 Cycle 3 FOLFIRI 12/01/2020 Cycle 4 FOLFIRI 12/22/2020 CT chest 01/11/2021-similar bilateral pulmonary nodules.  Left upper lobe nodule has mildly decreased in size.  No new nodules. Cycle 5 FOLFIRI 01/12/2021 Cycle 6 FOLFIRI 02/03/2021   2.   Hypothyroid   3.    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  Port-A-Cath placement 09/29/2019, interventional radiology   5.   Neutropenia and thrombocytopenia following cycle 1 FOLFOX-plan chemotherapy dose reductions, she declined G-CSF   6.  Mucositis following cycle 5 FOLFOX, 5-fluorouracil dose reduced with cycle 6 Mucositis following cycle 22 5-fluorouracil-Magic mouthwash added   7.  Right lung airspace disease/volume loss-likely toxicity from chest radiation, trial of prednisone 01/29/2020; dyspnea and cough improved 02/11/2020 Progressive cough 03/11/2020-prednisone resumed at a dose of 20 mg daily Cough and dyspnea improved 03/25/2020-prednisone taper to 15 mg daily Improved 04/06/2020-prednisone taper to 10 mg daily Stable 04/21/2020-prednisone taper to 5 mg daily    Disposition: Samantha Lutz appears stable.  She has completed 5 cycles of FOLFIRI.  She is tolerating well.  Plan to proceed with cycle 6 today as scheduled.  CBC from today reviewed.  Counts adequate to proceed with treatment.  She will return for lab, follow-up, FOLFIRI in 3 weeks.  We are available to see her sooner if needed.  Patient seen with Dr. Benay Spice.    Ned Card ANP/GNP-BC   02/03/2021  9:12 AM

## 2021-02-03 NOTE — Patient Instructions (Addendum)
[place] [facility]  The chemotherapy medication bag should finish at 46 hours, 96 hours, or 7 days. For example, if your pump is scheduled for 46 hours and it was put on at 4:00 p.m., it should finish at 2:00 p.m. the day it is scheduled to come off regardless of your appointment time.     Estimated time to finish at  11:15 Saturday, February 05, 2021.   If the display on your pump reads "Low Volume" and it is beeping, take the batteries out of the pump and come to the cancer center for it to be taken off.   If the pump alarms go off prior to the pump reading "Low Volume" then call [phone] and someone can assist you.  If the plunger comes out and the chemotherapy medication is leaking out, please use your home chemo spill kit to clean up the spill. Do NOT use paper towels or other household products.  If you have problems or questions regarding your pump, please call either [phone] (24 hours a day) or the cancer center Monday-Friday 8:00 a.m.- 4:30 p.m. at the clinic number and we will assist you. If you are unable to get assistance, then go to the nearest Emergency Department and ask the staff to contact the IV team for assistance.   Discharge Instructions: Thank you for choosing [facility] to provide your oncology and hematology care.   If you have a lab appointment with the [facility], please go directly to the [facility] and check in at the registration area.   Wear comfortable clothing and clothing appropriate for easy access to any Portacath or PICC line.   We strive to give you quality time with your provider. You may need to reschedule your appointment if you arrive late (15 or more minutes).  Arriving late affects you and other patients whose appointments are after yours.  Also, if you miss three or more appointments without notifying the office, you may be dismissed from the clinic at the providers discretion.      For prescription  refill requests, have your pharmacy contact our office and allow 72 hours for refills to be completed.    Today you received the following chemotherapy and/or immunotherapy agents Irinotecan, leucovorin, fluorouracil      To help prevent nausea and vomiting after your treatment, we encourage you to take your nausea medication as directed.  BELOW ARE SYMPTOMS THAT SHOULD BE REPORTED IMMEDIATELY: *FEVER GREATER THAN 100.4 F (38 C) OR HIGHER *CHILLS OR SWEATING *NAUSEA AND VOMITING THAT IS NOT CONTROLLED WITH YOUR NAUSEA MEDICATION *UNUSUAL SHORTNESS OF BREATH *UNUSUAL BRUISING OR BLEEDING *URINARY PROBLEMS (pain or burning when urinating, or frequent urination) *BOWEL PROBLEMS (unusual diarrhea, constipation, pain near the anus) TENDERNESS IN MOUTH AND THROAT WITH OR WITHOUT PRESENCE OF ULCERS (sore throat, sores in mouth, or a toothache) UNUSUAL RASH, SWELLING OR PAIN  UNUSUAL VAGINAL DISCHARGE OR ITCHING   Items with * indicate a potential emergency and should be followed up as soon as possible or go to the Emergency Department if any problems should occur.  Please show the CHEMOTHERAPY ALERT CARD or IMMUNOTHERAPY ALERT CARD at check-in to the Emergency Department and triage nurse.  Should you have questions after your visit or need to cancel or reschedule your appointment, please contact [facility]  Dept: [phone]  and follow the prompts.  Office hours are 8:00 a.m. to 4:30 p.m. Monday - Friday. Please note that  voicemails left after 4:00 p.m. may not be returned until the following business day.  We are closed weekends and major holidays. You have access to a nurse at all times for urgent questions. Please call the main number to the clinic Dept: [phone] and follow the prompts.   For any non-urgent questions, you may also contact your provider using MyChart. We now offer e-Visits for anyone 47 and older to request care online for non-urgent symptoms.  For details visit [email or web address].   Also download the MyChart app! Go to the app store, search "MyChart", open the app, select [facility], and log in with your MyChart username and password.  Due to Covid, a mask is required upon entering the hospital/clinic. If you do not have a mask, one will be given to you upon arrival. For doctor visits, patients may have 1 support person aged 31 or older with them. For treatment visits, patients cannot have anyone with them due to current Covid guidelines and our immunocompromised population.   Irinotecan injection What is this medication? IRINOTECAN (ir in oh TEE kan ) is a chemotherapy drug. It is used to treat colon and rectal cancer. This medicine may be used for other purposes; ask your health care provider or pharmacist if you have questions. COMMON BRAND NAME(S): Camptosar What should I tell my care team before I take this medication? They need to know if you have any of these conditions: dehydration diarrhea infection (especially a virus infection such as chickenpox, cold sores, or herpes) liver disease low blood counts, like low white cell, platelet, or red cell counts low levels of calcium, magnesium, or potassium in the blood recent or ongoing radiation therapy an unusual or allergic reaction to irinotecan, other medicines, foods, dyes, or preservatives pregnant or trying to get pregnant breast-feeding How should I use this medication? This drug is given as an infusion into a vein. It is administered in a hospital or clinic by a specially trained health care professional. Talk to your pediatrician regarding the use of this medicine in children. Special care may be needed. Overdosage: If you think you have taken too much of this medicine contact a poison control center or emergency room at once. NOTE: This medicine is only for you. Do not share this medicine with others. What if I miss a dose? It is important not to miss your  dose. Call your doctor or health care professional if you are unable to keep an appointment. What may interact with this medication? Do not take this medicine with any of the following medications: cobicistat itraconazole This medicine may interact with the following medications: antiviral medicines for HIV or AIDS certain antibiotics like rifampin or rifabutin certain medicines for fungal infections like ketoconazole, posaconazole, and voriconazole certain medicines for seizures like carbamazepine, phenobarbital, phenotoin clarithromycin gemfibrozil nefazodone St. John's Wort This list may not describe all possible interactions. Give your health care provider a list of all the medicines, herbs, non-prescription drugs, or dietary supplements you use. Also tell them if you smoke, drink alcohol, or use illegal drugs. Some items may interact with your medicine. What should I watch for while using this medication? Your condition will be monitored carefully while you are receiving this medicine. You will need important blood work done while you are taking this medicine. This drug may make you feel generally unwell. This is not uncommon, as chemotherapy can affect healthy cells as well as cancer cells. Report any side effects. Continue your course of treatment even though  you feel ill unless your doctor tells you to stop. In some cases, you may be given additional medicines to help with side effects. Follow all directions for their use. You may get drowsy or dizzy. Do not drive, use machinery, or do anything that needs mental alertness until you know how this medicine affects you. Do not stand or sit up quickly, especially if you are an older patient. This reduces the risk of dizzy or fainting spells. Call your health care professional for advice if you get a fever, chills, or sore throat, or other symptoms of a cold or flu. Do not treat yourself. This medicine decreases your body's ability to fight  infections. Try to avoid being around people who are sick. Avoid taking products that contain aspirin, acetaminophen, ibuprofen, naproxen, or ketoprofen unless instructed by your doctor. These medicines may hide a fever. This medicine may increase your risk to bruise or bleed. Call your doctor or health care professional if you notice any unusual bleeding. Be careful brushing and flossing your teeth or using a toothpick because you may get an infection or bleed more easily. If you have any dental work done, tell your dentist you are receiving this medicine. Do not become pregnant while taking this medicine or for 6 months after stopping it. Women should inform their health care professional if they wish to become pregnant or think they might be pregnant. Men should not father a child while taking this medicine and for 3 months after stopping it. There is potential for serious side effects to an unborn child. Talk to your health care professional for more information. Do not breast-feed an infant while taking this medicine or for 7 days after stopping it. This medicine has caused ovarian failure in some women. This medicine may make it more difficult to get pregnant. Talk to your health care professional if you are concerned about your fertility. This medicine has caused decreased sperm counts in some men. This may make it more difficult to father a child. Talk to your health care professional if you are concerned about your fertility. What side effects may I notice from receiving this medication? Side effects that you should report to your doctor or health care professional as soon as possible: allergic reactions like skin rash, itching or hives, swelling of the face, lips, or tongue chest pain diarrhea flushing, runny nose, sweating during infusion low blood counts - this medicine may decrease the number of white blood cells, red blood cells and platelets. You may be at increased risk for infections  and bleeding. nausea, vomiting pain, swelling, warmth in the leg signs of decreased platelets or bleeding - bruising, pinpoint red spots on the skin, black, tarry stools, blood in the urine signs of infection - fever or chills, cough, sore throat, pain or difficulty passing urine signs of decreased red blood cells - unusually weak or tired, fainting spells, lightheadedness Side effects that usually do not require medical attention (report to your doctor or health care professional if they continue or are bothersome): constipation hair loss headache loss of appetite mouth sores stomach pain This list may not describe all possible side effects. Call your doctor for medical advice about side effects. You may report side effects to FDA at 1-800-FDA-1088. Where should I keep my medication? This drug is given in a hospital or clinic and will not be stored at home. NOTE: This sheet is a summary. It may not cover all possible information. If you have questions about this  medicine, talk to your doctor, pharmacist, or health care provider.  2022 Elsevier/Gold Standard (2020-10-05 00:00:00)  Leucovorin injection What is this medication? LEUCOVORIN (loo koe VOR in) is used to prevent or treat the harmful effects of some medicines. This medicine is used to treat anemia caused by a low amount of folic acid in the body. It is also used with 5-fluorouracil (5-FU) to treat colon cancer. This medicine may be used for other purposes; ask your health care provider or pharmacist if you have questions. What should I tell my care team before I take this medication? They need to know if you have any of these conditions: anemia from low levels of vitamin B-12 in the blood an unusual or allergic reaction to leucovorin, folic acid, other medicines, foods, dyes, or preservatives pregnant or trying to get pregnant breast-feeding How should I use this medication? This medicine is for injection into a muscle or into  a vein. It is given by a health care professional in a hospital or clinic setting. Talk to your pediatrician regarding the use of this medicine in children. Special care may be needed. Overdosage: If you think you have taken too much of this medicine contact a poison control center or emergency room at once. NOTE: This medicine is only for you. Do not share this medicine with others. What if I miss a dose? This does not apply. What may interact with this medication? capecitabine fluorouracil phenobarbital phenytoin primidone trimethoprim-sulfamethoxazole This list may not describe all possible interactions. Give your health care provider a list of all the medicines, herbs, non-prescription drugs, or dietary supplements you use. Also tell them if you smoke, drink alcohol, or use illegal drugs. Some items may interact with your medicine. What should I watch for while using this medication? Your condition will be monitored carefully while you are receiving this medicine. This medicine may increase the side effects of 5-fluorouracil, 5-FU. Tell your doctor or health care professional if you have diarrhea or mouth sores that do not get better or that get worse. What side effects may I notice from receiving this medication? Side effects that you should report to your doctor or health care professional as soon as possible: allergic reactions like skin rash, itching or hives, swelling of the face, lips, or tongue breathing problems fever, infection mouth sores unusual bleeding or bruising unusually weak or tired Side effects that usually do not require medical attention (report to your doctor or health care professional if they continue or are bothersome): constipation or diarrhea loss of appetite nausea, vomiting This list may not describe all possible side effects. Call your doctor for medical advice about side effects. You may report side effects to FDA at 1-800-FDA-1088. Where should I keep  my medication? This drug is given in a hospital or clinic and will not be stored at home. NOTE: This sheet is a summary. It may not cover all possible information. If you have questions about this medicine, talk to your doctor, pharmacist, or health care provider.  2022 Elsevier/Gold Standard (2007-07-25 00:00:00)  Fluorouracil, 5-FU injection What is this medication? FLUOROURACIL, 5-FU (flure oh YOOR a sil) is a chemotherapy drug. It slows the growth of cancer cells. This medicine is used to treat many types of cancer like breast cancer, colon or rectal cancer, pancreatic cancer, and stomach cancer. This medicine may be used for other purposes; ask your health care provider or pharmacist if you have questions. COMMON BRAND NAME(S): Adrucil What should I tell my care  team before I take this medication? They need to know if you have any of these conditions: blood disorders dihydropyrimidine dehydrogenase (DPD) deficiency infection (especially a virus infection such as chickenpox, cold sores, or herpes) kidney disease liver disease malnourished, poor nutrition recent or ongoing radiation therapy an unusual or allergic reaction to fluorouracil, other chemotherapy, other medicines, foods, dyes, or preservatives pregnant or trying to get pregnant breast-feeding How should I use this medication? This drug is given as an infusion or injection into a vein. It is administered in a hospital or clinic by a specially trained health care professional. Talk to your pediatrician regarding the use of this medicine in children. Special care may be needed. Overdosage: If you think you have taken too much of this medicine contact a poison control center or emergency room at once. NOTE: This medicine is only for you. Do not share this medicine with others. What if I miss a dose? It is important not to miss your dose. Call your doctor or health care professional if you are unable to keep an  appointment. What may interact with this medication? Do not take this medicine with any of the following medications: live virus vaccines This medicine may also interact with the following medications: medicines that treat or prevent blood clots like warfarin, enoxaparin, and dalteparin This list may not describe all possible interactions. Give your health care provider a list of all the medicines, herbs, non-prescription drugs, or dietary supplements you use. Also tell them if you smoke, drink alcohol, or use illegal drugs. Some items may interact with your medicine. What should I watch for while using this medication? Visit your doctor for checks on your progress. This drug may make you feel generally unwell. This is not uncommon, as chemotherapy can affect healthy cells as well as cancer cells. Report any side effects. Continue your course of treatment even though you feel ill unless your doctor tells you to stop. In some cases, you may be given additional medicines to help with side effects. Follow all directions for their use. Call your doctor or health care professional for advice if you get a fever, chills or sore throat, or other symptoms of a cold or flu. Do not treat yourself. This drug decreases your body's ability to fight infections. Try to avoid being around people who are sick. This medicine may increase your risk to bruise or bleed. Call your doctor or health care professional if you notice any unusual bleeding. Be careful brushing and flossing your teeth or using a toothpick because you may get an infection or bleed more easily. If you have any dental work done, tell your dentist you are receiving this medicine. Avoid taking products that contain aspirin, acetaminophen, ibuprofen, naproxen, or ketoprofen unless instructed by your doctor. These medicines may hide a fever. Do not become pregnant while taking this medicine. Women should inform their doctor if they wish to become pregnant  or think they might be pregnant. There is a potential for serious side effects to an unborn child. Talk to your health care professional or pharmacist for more information. Do not breast-feed an infant while taking this medicine. Men should inform their doctor if they wish to father a child. This medicine may lower sperm counts. Do not treat diarrhea with over the counter products. Contact your doctor if you have diarrhea that lasts more than 2 days or if it is severe and watery. This medicine can make you more sensitive to the sun. Keep out of  the sun. If you cannot avoid being in the sun, wear protective clothing and use sunscreen. Do not use sun lamps or tanning beds/booths. What side effects may I notice from receiving this medication? Side effects that you should report to your doctor or health care professional as soon as possible: allergic reactions like skin rash, itching or hives, swelling of the face, lips, or tongue low blood counts - this medicine may decrease the number of white blood cells, red blood cells and platelets. You may be at increased risk for infections and bleeding. signs of infection - fever or chills, cough, sore throat, pain or difficulty passing urine signs of decreased platelets or bleeding - bruising, pinpoint red spots on the skin, black, tarry stools, blood in the urine signs of decreased red blood cells - unusually weak or tired, fainting spells, lightheadedness breathing problems changes in vision chest pain mouth sores nausea and vomiting pain, swelling, redness at site where injected pain, tingling, numbness in the hands or feet redness, swelling, or sores on hands or feet stomach pain unusual bleeding Side effects that usually do not require medical attention (report to your doctor or health care professional if they continue or are bothersome): changes in finger or toe nails diarrhea dry or itchy skin hair loss headache loss of appetite sensitivity  of eyes to the light stomach upset unusually teary eyes This list may not describe all possible side effects. Call your doctor for medical advice about side effects. You may report side effects to FDA at 1-800-FDA-1088. Where should I keep my medication? This drug is given in a hospital or clinic and will not be stored at home. NOTE: This sheet is a summary. It may not cover all possible information. If you have questions about this medicine, talk to your doctor, pharmacist, or health care provider.  2022 Elsevier/Gold Standard (2020-10-05 00:00:00)

## 2021-02-05 ENCOUNTER — Inpatient Hospital Stay: Payer: Medicare Other

## 2021-02-05 ENCOUNTER — Other Ambulatory Visit: Payer: Self-pay

## 2021-02-05 VITALS — BP 149/67 | HR 86 | Temp 97.8°F | Resp 16

## 2021-02-05 DIAGNOSIS — Z5111 Encounter for antineoplastic chemotherapy: Secondary | ICD-10-CM | POA: Diagnosis not present

## 2021-02-05 DIAGNOSIS — C20 Malignant neoplasm of rectum: Secondary | ICD-10-CM

## 2021-02-05 MED ORDER — HEPARIN SOD (PORK) LOCK FLUSH 100 UNIT/ML IV SOLN
500.0000 [IU] | Freq: Once | INTRAVENOUS | Status: AC | PRN
  Administered 2021-02-05: 500 [IU]

## 2021-02-05 MED ORDER — SODIUM CHLORIDE 0.9% FLUSH
10.0000 mL | INTRAVENOUS | Status: DC | PRN
  Administered 2021-02-05: 10 mL

## 2021-02-20 ENCOUNTER — Other Ambulatory Visit: Payer: Self-pay | Admitting: Oncology

## 2021-02-21 ENCOUNTER — Telehealth: Payer: Self-pay | Admitting: Oncology

## 2021-02-21 NOTE — Telephone Encounter (Signed)
Attempted to contact patient in regards of rescheduling appts. No answer so left voicemail for patient to call back. In message she was told the day and time of rescheduled appts and that they will be posted in [facility].

## 2021-02-22 ENCOUNTER — Inpatient Hospital Stay: Payer: Medicare Other

## 2021-02-22 ENCOUNTER — Inpatient Hospital Stay: Payer: Medicare Other | Admitting: Oncology

## 2021-02-24 ENCOUNTER — Inpatient Hospital Stay: Payer: Medicare Other

## 2021-03-01 ENCOUNTER — Inpatient Hospital Stay: Payer: Medicare Other

## 2021-03-01 ENCOUNTER — Encounter: Payer: Self-pay | Admitting: Nurse Practitioner

## 2021-03-01 ENCOUNTER — Encounter: Payer: Self-pay | Admitting: *Deleted

## 2021-03-01 ENCOUNTER — Other Ambulatory Visit: Payer: Self-pay

## 2021-03-01 ENCOUNTER — Inpatient Hospital Stay (HOSPITAL_BASED_OUTPATIENT_CLINIC_OR_DEPARTMENT_OTHER): Payer: Medicare Other | Admitting: Nurse Practitioner

## 2021-03-01 VITALS — BP 170/74 | HR 95 | Temp 98.2°F | Resp 18 | Ht 63.0 in | Wt 211.4 lb

## 2021-03-01 VITALS — BP 166/79 | HR 69 | Resp 18

## 2021-03-01 DIAGNOSIS — Z5111 Encounter for antineoplastic chemotherapy: Secondary | ICD-10-CM | POA: Diagnosis not present

## 2021-03-01 DIAGNOSIS — C20 Malignant neoplasm of rectum: Secondary | ICD-10-CM

## 2021-03-01 LAB — CBC WITH DIFFERENTIAL (CANCER CENTER ONLY)
Abs Immature Granulocytes: 0.04 10*3/uL (ref 0.00–0.07)
Basophils Absolute: 0 10*3/uL (ref 0.0–0.1)
Basophils Relative: 1 %
Eosinophils Absolute: 0.1 10*3/uL (ref 0.0–0.5)
Eosinophils Relative: 2 %
HCT: 33.4 % — ABNORMAL LOW (ref 36.0–46.0)
Hemoglobin: 11.2 g/dL — ABNORMAL LOW (ref 12.0–15.0)
Immature Granulocytes: 1 %
Lymphocytes Relative: 17 %
Lymphs Abs: 0.9 10*3/uL (ref 0.7–4.0)
MCH: 33.5 pg (ref 26.0–34.0)
MCHC: 33.5 g/dL (ref 30.0–36.0)
MCV: 100 fL (ref 80.0–100.0)
Monocytes Absolute: 0.7 10*3/uL (ref 0.1–1.0)
Monocytes Relative: 13 %
Neutro Abs: 3.4 10*3/uL (ref 1.7–7.7)
Neutrophils Relative %: 66 %
Platelet Count: 98 10*3/uL — ABNORMAL LOW (ref 150–400)
RBC: 3.34 MIL/uL — ABNORMAL LOW (ref 3.87–5.11)
RDW: 14.3 % (ref 11.5–15.5)
WBC Count: 5.1 10*3/uL (ref 4.0–10.5)
nRBC: 0 % (ref 0.0–0.2)

## 2021-03-01 LAB — CMP (CANCER CENTER ONLY)
ALT: 15 U/L (ref 0–44)
AST: 21 U/L (ref 15–41)
Albumin: 3.6 g/dL (ref 3.5–5.0)
Alkaline Phosphatase: 53 U/L (ref 38–126)
Anion gap: 10 (ref 5–15)
BUN: 14 mg/dL (ref 8–23)
CO2: 25 mmol/L (ref 22–32)
Calcium: 8.7 mg/dL — ABNORMAL LOW (ref 8.9–10.3)
Chloride: 108 mmol/L (ref 98–111)
Creatinine: 0.84 mg/dL (ref 0.44–1.00)
GFR, Estimated: >60 mL/min (ref >60)
Glucose, Bld: 93 mg/dL (ref 70–99)
Potassium: 3.4 mmol/L — ABNORMAL LOW (ref 3.5–5.1)
Sodium: 143 mmol/L (ref 135–145)
Total Bilirubin: 0.8 mg/dL (ref 0.3–1.2)
Total Protein: 6.3 g/dL — ABNORMAL LOW (ref 6.5–8.1)

## 2021-03-01 MED ORDER — FLUOROURACIL CHEMO INJECTION 2.5 GM/50ML
300.0000 mg/m2 | Freq: Once | INTRAVENOUS | Status: AC
  Administered 2021-03-01: 600 mg via INTRAVENOUS
  Filled 2021-03-01: qty 12

## 2021-03-01 MED ORDER — SODIUM CHLORIDE 0.9 % IV SOLN: Freq: Once | INTRAVENOUS | Status: AC

## 2021-03-01 MED ORDER — DEXAMETHASONE SODIUM PHOSPHATE 10 MG/ML IJ SOLN
5.0000 mg | Freq: Once | INTRAMUSCULAR | Status: AC
  Administered 2021-03-01: 5 mg via INTRAVENOUS
  Filled 2021-03-01: qty 1

## 2021-03-01 MED ORDER — SODIUM CHLORIDE 0.9 % IV SOLN
135.0000 mg/m2 | Freq: Once | INTRAVENOUS | Status: AC
  Administered 2021-03-01: 280 mg via INTRAVENOUS
  Filled 2021-03-01: qty 4

## 2021-03-01 MED ORDER — SODIUM CHLORIDE 0.9% FLUSH: 10.0000 mL | INTRAVENOUS | Status: DC | PRN

## 2021-03-01 MED ORDER — SODIUM CHLORIDE 0.9 % IV SOLN
1800.0000 mg/m2 | INTRAVENOUS | Status: DC
  Administered 2021-03-01: 3700 mg via INTRAVENOUS
  Filled 2021-03-01: qty 74

## 2021-03-01 MED ORDER — SODIUM CHLORIDE 0.9 % IV SOLN
300.0000 mg/m2 | Freq: Once | INTRAVENOUS | Status: AC
  Administered 2021-03-01: 618 mg via INTRAVENOUS
  Filled 2021-03-01: qty 30.9

## 2021-03-01 MED ORDER — PROCHLORPERAZINE MALEATE 10 MG PO TABS: 10.0000 mg | ORAL_TABLET | Freq: Four times a day (QID) | ORAL | 2 refills | Status: DC | PRN

## 2021-03-01 MED ORDER — PALONOSETRON HCL INJECTION 0.25 MG/5ML
0.2500 mg | Freq: Once | INTRAVENOUS | Status: AC
  Administered 2021-03-01: 0.25 mg via INTRAVENOUS
  Filled 2021-03-01: qty 5

## 2021-03-01 MED ORDER — ATROPINE SULFATE 1 MG/ML IV SOLN
0.5000 mg | Freq: Once | INTRAVENOUS | Status: AC | PRN
  Administered 2021-03-01: 0.5 mg via INTRAVENOUS
  Filled 2021-03-01: qty 1

## 2021-03-01 NOTE — Progress Notes (Signed)
[facility] OFFICE PROGRESS NOTE   Diagnosis: Rectal cancer  INTERVAL HISTORY:   Samantha Lutz returns for follow-up.  She completed cycle 6 FOLFIRI 02/03/2021.  She denies nausea/vomiting.  No mouth sores.  No diarrhea.  She notes occasional wheezing.  She is utilizing her albuterol inhaler.  No change in mild dyspnea on exertion.  No fever.  No upper respiratory symptoms.  Objective:  Vital signs in last 24 hours:  Blood pressure (!) 170/74, pulse 95, temperature 98.2 F (36.8 C), temperature source Oral, resp. rate 18, height _0  (1.6 m), weight 211 lb 6.4 oz (95.9 kg), SpO2 98 %.    HEENT: No thrush or ulcers. Resp: Upper airway wheezing.  No respiratory distress. Cardio: Regular rate and rhythm. GI: Abdomen soft and nontender.  No hepatomegaly. Vascular: No leg edema. Skin: Palms without erythema. Port-A-Cath without erythema.   Lab Results:  Lab Results  Component Value Date   WBC 5.1 03/01/2021   HGB 11.2 (L) 03/01/2021   HCT 33.4 (L) 03/01/2021   MCV 100.0 03/01/2021   PLT 98 (L) 03/01/2021   NEUTROABS 3.4 03/01/2021    Imaging:  No results found.  Medications: I have reviewed the patient's current medications.  Assessment/Plan: Rectal cancer Mass at 7 cm from the anal verge on colonoscopy 08/09/2017, biopsy revealed invasive adenocarcinoma Staging CTs 08/17/2017-no evidence of metastatic disease, asymmetric thickening in the mid rectum MR pelvis 09/01/2017, T3N0 lesion beginning at 6.3 cm from the anal sphincter Radiation/Xeloda initiated 09/17/2017, completed 10/25/2017 Low anterior resection/diverting ileostomy 12/21/2017,ypT3,ypN1a tumor.  Lymphovascular invasion present, intact mismatch repair protein expression, treatment effect present (TRS 1).  Mismatch repair protein IHC normal; Foundation 1-KRAS/NRAS wild-type, microsatellite status and tumor mutational burden could not be determined. Cycle 1 adjuvant Xeloda beginning 01/21/2018 Cycle 2  adjuvant Xeloda beginning 02/11/2018 Xeloda discontinued after cycle 2 secondary to patient preference Ileostomy takedown 07/10/2018 CTs 09/07/2018- multiple live small pulmonary nodules concerning for metastatic disease CT chest 12/10/2018-multiple bilateral lung nodules, some have increased in size CT chest 04/08/2019-mild enlargement of bilateral lung nodules Status post SBRT multiple lung nodules 05/06/2019, 05/08/2019, 05/13/2019 CT chest 08/28/2019-improvement and resolution in majority of right-sided pulmonary nodules, a superior segment right lower lobe nodule has increased, no new right-sided nodules.  Progressive enlargement of left-sided pulmonary nodules PET scan 09/09/2019-hypermetabolic right lower lobe nodule, 2 hypermetabolic left upper lobe nodules with an additional 0.7 cm left upper lobe nodule below PET resolution, groundglass opacity in the mid to right lower lobe with associated hypermetabolism consistent with postradiation change, hypermetabolic central segment 4A liver lesion Cycle 1 FOLFOX 10/14/2019 Cycle  2FOLFOX 11/11/2019, 5-FU and oxaliplatin dose reduced secondary to neutropenia and thrombocytopenia following cycle one, G-CSF declined Cycle 3 FOLFOX 11/26/2019 Cycle 4 FOLFOX 12/11/2019, oxaliplatin held due to neutropenia and thrombocytopenia Cycle 5 FOLFOX 12/31/2019 Cycle 6 FOLFOX 01/14/2020 (oxaliplatin held, 5-fluorouracil dose reduced due to mucositis) CT chest 01/27/2020-improvement in left lung nodules, progressive airspace disease with traction bronchiectasis throughout the right lung, previously noted right lung nodules are obscured, mildly enlarged right paratracheal lymph nodes-not hypermetabolic on prior PET, likely reactive Cycle 7 FOLFOX 01/28/2020 Cycle 8 FOLFOX 02/11/2020 (oxaliplatin held due to neutropenia and thrombocytopenia) Cycle 9 FOLFOX 02/25/2020 (oxaliplatin held due to thrombocytopenia) Cycle 10 FOLFOX 03/11/2020 (oxaliplatin held secondary to  neuropathy) Cycle 11 FOLFOX 03/25/2020 (oxaliplatin held due to neuropathy) CT chest 04/05/2020-no new or progressive findings.  Interval evolution of presumed postradiation scarring in the right perihilar lung with decrease in the more diffuse  groundglass opacity seen previously.  No substantial change in left lung nodules. 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  No definite CT findings to suggest recurrent tumor.  Stable small left upper lobe pulmonary nodules.  No new or progressive findings.  Stable small right paratracheal lymph nodes.  Stable area of irregular low-attenuation in hepatic segment 4A, site of known prior hepatic metastatic lesion.  No new or progressive findings. Cycle 18 5-fluorouracil 07/06/2020 Cycle 19 5-fluorouracil 07/20/2020 Cycle 20 5-fluorouracil 08/03/2020 Cycle 21 5-fluorouracil 08/17/2020  Cycle 22 5-fluorouracil 08/31/2020 Cycle 23 5-fluorouracil 09/14/2020 CT chest 09/24/2020-increased number and size of pulmonary nodules bilaterally.  Findings in the right upper lobe are concerning for potential lymphangitic spread of tumor.  Right upper lobe findings could also reflect evolving postradiation change. Cycle 1 FOLFIRI 10/12/2020 Cycle 2 FOLFIRI 11/08/2020, irinotecan dose reduced due to neutropenia and thrombocytopenia following cycle 1 Cycle 3 FOLFIRI 12/01/2020 Cycle 4 FOLFIRI 12/22/2020 CT chest 01/11/2021-similar bilateral pulmonary nodules.  Left upper lobe nodule has mildly decreased in size.  No new nodules. Cycle 5 FOLFIRI 01/12/2021 Cycle 6 FOLFIRI 02/03/2021 Cycle 7 FOLFIRI 03/01/2021   2.   Hypothyroid   3.    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  Port-A-Cath placement 09/29/2019, interventional radiology   5.  Neutropenia and thrombocytopenia following cycle 1 FOLFOX-plan chemotherapy dose reductions, she declined G-CSF   6.  Mucositis following cycle 5 FOLFOX, 5-fluorouracil dose reduced with cycle 6 Mucositis following cycle 22 5-fluorouracil-Magic mouthwash added   7.  Right lung airspace disease/volume loss-likely toxicity from chest radiation, trial of prednisone 01/29/2020; dyspnea and cough improved 02/11/2020 Progressive cough 03/11/2020-prednisone resumed at a dose of 20 mg daily Cough and dyspnea improved 03/25/2020-prednisone taper to 15 mg daily Improved 04/06/2020-prednisone taper to 10 mg daily Stable 04/21/2020-prednisone taper to 5 mg daily  Disposition: Samantha Lutz appears stable.  She has completed 6 cycles of FOLFIRI.  Plan to proceed with cycle 7 today as scheduled.  CBC and chemistry panel reviewed.  Labs adequate to proceed with treatment.  She has stable mild to moderate thrombocytopenia.  She has mild upper airway wheezing on exam.  No dyspnea.  The wheezing occurs intermittently.  She is utilizing an albuterol inhaler as needed with good relief.  She will contact the office if wheezing worsens or she develops shortness of breath.  She will return for lab, follow-up, FOLFIRI in 3 weeks.  She will contact the office in the interim with any problems.   Ned Card ANP/GNP-BC   03/01/2021  10:48 AM

## 2021-03-01 NOTE — Progress Notes (Signed)
Patient presents for treatment. RN assessment completed along with the following:  Labs/vitals reviewed - Yes, and within treatment parameters.   Weight within 10% of previous measurement - Yes Informed consent completed and reflects current therapy/intent - Yes, on date 10/12/2020             Provider progress note reviewed - Yes, today's provider note was reviewed. Treatment/Antibody/Supportive plan reviewed - Yes, and there are no adjustments needed for today's treatment. S&H and other orders reviewed - Yes, and there are no additional orders identified. Previous treatment date reviewed - Yes, and the appropriate amount of time has elapsed between treatments. Clinic Hand Off Received from - [facility], [place].  Patient to proceed with treatment.

## 2021-03-01 NOTE — Patient Instructions (Signed)
Dent  The chemotherapy medication bag should finish at 46 hours, 96 hours, or 7 days. For example, if your pump is scheduled for 46 hours and it was put on at 4:00 p.m., it should finish at 2:00 p.m. the day it is scheduled to come off regardless of your appointment time.     Estimated time to finish at 12pm on 03/03/21.   If the display on your pump reads "Low Volume" and it is beeping, take the batteries out of the pump and come to the cancer center for it to be taken off.   If the pump alarms go off prior to the pump reading "Low Volume" then call [phone] and someone can assist you.  If the plunger comes out and the chemotherapy medication is leaking out, please use your home chemo spill kit to clean up the spill. Do NOT use paper towels or other household products.  If you have problems or questions regarding your pump, please call either [phone] (24 hours a day) or the cancer center Monday-Friday 8:00 a.m.- 4:30 p.m. at the clinic number and we will assist you. If you are unable to get assistance, then go to the nearest Emergency Department and ask the staff to contact the IV team for assistance.   Discharge Instructions: Thank you for choosing [facility] to provide your oncology and hematology care.   If you have a lab appointment with the [facility], please go directly to the [facility] and check in at the registration area.   Wear comfortable clothing and clothing appropriate for easy access to any Portacath or PICC line.   We strive to give you quality time with your provider. You may need to reschedule your appointment if you arrive late (15 or more minutes).  Arriving late affects you and other patients whose appointments are after yours.  Also, if you miss three or more appointments without notifying the office, you may be dismissed from the clinic at the providers discretion.      For prescription refill requests,  have your pharmacy contact our office and allow 72 hours for refills to be completed.    Today you received the following chemotherapy and/or immunotherapy agents Irinotecan, leucovorin, fluorouracil      To help prevent nausea and vomiting after your treatment, we encourage you to take your nausea medication as directed.  BELOW ARE SYMPTOMS THAT SHOULD BE REPORTED IMMEDIATELY: *FEVER GREATER THAN 100.4 F (38 C) OR HIGHER *CHILLS OR SWEATING *NAUSEA AND VOMITING THAT IS NOT CONTROLLED WITH YOUR NAUSEA MEDICATION *UNUSUAL SHORTNESS OF BREATH *UNUSUAL BRUISING OR BLEEDING *URINARY PROBLEMS (pain or burning when urinating, or frequent urination) *BOWEL PROBLEMS (unusual diarrhea, constipation, pain near the anus) TENDERNESS IN MOUTH AND THROAT WITH OR WITHOUT PRESENCE OF ULCERS (sore throat, sores in mouth, or a toothache) UNUSUAL RASH, SWELLING OR PAIN  UNUSUAL VAGINAL DISCHARGE OR ITCHING   Items with * indicate a potential emergency and should be followed up as soon as possible or go to the Emergency Department if any problems should occur.  Please show the CHEMOTHERAPY ALERT CARD or IMMUNOTHERAPY ALERT CARD at check-in to the Emergency Department and triage nurse.  Should you have questions after your visit or need to cancel or reschedule your appointment, please contact [facility]  Dept: [phone]  and follow the prompts.  Office hours are 8:00 a.m. to 4:30 p.m. Monday - Friday. Please note that voicemails left after  4:00 p.m. may not be returned until the following business day.  We are closed weekends and major holidays. You have access to a nurse at all times for urgent questions. Please call the main number to the clinic Dept: [phone] and follow the prompts.   For any non-urgent questions, you may also contact your provider using MyChart. We now offer e-Visits for anyone 73 and older to request care online for non-urgent symptoms. For details visit  [email or web address].   Also download the MyChart app! Go to the app store, search "MyChart", open the app, select [facility], and log in with your MyChart username and password.  Due to Covid, a mask is required upon entering the hospital/clinic. If you do not have a mask, one will be given to you upon arrival. For doctor visits, patients may have 1 support person aged 73 or older with them. For treatment visits, patients cannot have anyone with them due to current Covid guidelines and our immunocompromised population.   Irinotecan injection What is this medication? IRINOTECAN (ir in oh TEE kan ) is a chemotherapy drug. It is used to treat colon and rectal cancer. This medicine may be used for other purposes; ask your health care provider or pharmacist if you have questions. COMMON BRAND NAME(S): Camptosar What should I tell my care team before I take this medication? They need to know if you have any of these conditions: dehydration diarrhea infection (especially a virus infection such as chickenpox, cold sores, or herpes) liver disease low blood counts, like low white cell, platelet, or red cell counts low levels of calcium, magnesium, or potassium in the blood recent or ongoing radiation therapy an unusual or allergic reaction to irinotecan, other medicines, foods, dyes, or preservatives pregnant or trying to get pregnant breast-feeding How should I use this medication? This drug is given as an infusion into a vein. It is administered in a hospital or clinic by a specially trained health care professional. Talk to your pediatrician regarding the use of this medicine in children. Special care may be needed. Overdosage: If you think you have taken too much of this medicine contact a poison control center or emergency room at once. NOTE: This medicine is only for you. Do not share this medicine with others. What if I miss a dose? It is important not to miss your dose. Call your  doctor or health care professional if you are unable to keep an appointment. What may interact with this medication? Do not take this medicine with any of the following medications: cobicistat itraconazole This medicine may interact with the following medications: antiviral medicines for HIV or AIDS certain antibiotics like rifampin or rifabutin certain medicines for fungal infections like ketoconazole, posaconazole, and voriconazole certain medicines for seizures like carbamazepine, phenobarbital, phenotoin clarithromycin gemfibrozil nefazodone St. John's Wort This list may not describe all possible interactions. Give your health care provider a list of all the medicines, herbs, non-prescription drugs, or dietary supplements you use. Also tell them if you smoke, drink alcohol, or use illegal drugs. Some items may interact with your medicine. What should I watch for while using this medication? Your condition will be monitored carefully while you are receiving this medicine. You will need important blood work done while you are taking this medicine. This drug may make you feel generally unwell. This is not uncommon, as chemotherapy can affect healthy cells as well as cancer cells. Report any side effects. Continue your course of treatment even though you feel ill  unless your doctor tells you to stop. In some cases, you may be given additional medicines to help with side effects. Follow all directions for their use. You may get drowsy or dizzy. Do not drive, use machinery, or do anything that needs mental alertness until you know how this medicine affects you. Do not stand or sit up quickly, especially if you are an older patient. This reduces the risk of dizzy or fainting spells. Call your health care professional for advice if you get a fever, chills, or sore throat, or other symptoms of a cold or flu. Do not treat yourself. This medicine decreases your body's ability to fight infections. Try to  avoid being around people who are sick. Avoid taking products that contain aspirin, acetaminophen, ibuprofen, naproxen, or ketoprofen unless instructed by your doctor. These medicines may hide a fever. This medicine may increase your risk to bruise or bleed. Call your doctor or health care professional if you notice any unusual bleeding. Be careful brushing and flossing your teeth or using a toothpick because you may get an infection or bleed more easily. If you have any dental work done, tell your dentist you are receiving this medicine. Do not become pregnant while taking this medicine or for 6 months after stopping it. Women should inform their health care professional if they wish to become pregnant or think they might be pregnant. Men should not father a child while taking this medicine and for 3 months after stopping it. There is potential for serious side effects to an unborn child. Talk to your health care professional for more information. Do not breast-feed an infant while taking this medicine or for 7 days after stopping it. This medicine has caused ovarian failure in some women. This medicine may make it more difficult to get pregnant. Talk to your health care professional if you are concerned about your fertility. This medicine has caused decreased sperm counts in some men. This may make it more difficult to father a child. Talk to your health care professional if you are concerned about your fertility. What side effects may I notice from receiving this medication? Side effects that you should report to your doctor or health care professional as soon as possible: allergic reactions like skin rash, itching or hives, swelling of the face, lips, or tongue chest pain diarrhea flushing, runny nose, sweating during infusion low blood counts - this medicine may decrease the number of white blood cells, red blood cells and platelets. You may be at increased risk for infections and  bleeding. nausea, vomiting pain, swelling, warmth in the leg signs of decreased platelets or bleeding - bruising, pinpoint red spots on the skin, black, tarry stools, blood in the urine signs of infection - fever or chills, cough, sore throat, pain or difficulty passing urine signs of decreased red blood cells - unusually weak or tired, fainting spells, lightheadedness Side effects that usually do not require medical attention (report to your doctor or health care professional if they continue or are bothersome): constipation hair loss headache loss of appetite mouth sores stomach pain This list may not describe all possible side effects. Call your doctor for medical advice about side effects. You may report side effects to FDA at 1-800-FDA-1088. Where should I keep my medication? This drug is given in a hospital or clinic and will not be stored at home. NOTE: This sheet is a summary. It may not cover all possible information. If you have questions about this medicine, talk to  your doctor, pharmacist, or health care provider.  2022 Elsevier/Gold Standard (2020-10-05 00:00:00)  Leucovorin injection What is this medication? LEUCOVORIN (loo koe VOR in) is used to prevent or treat the harmful effects of some medicines. This medicine is used to treat anemia caused by a low amount of folic acid in the body. It is also used with 5-fluorouracil (5-FU) to treat colon cancer. This medicine may be used for other purposes; ask your health care provider or pharmacist if you have questions. What should I tell my care team before I take this medication? They need to know if you have any of these conditions: anemia from low levels of vitamin B-12 in the blood an unusual or allergic reaction to leucovorin, folic acid, other medicines, foods, dyes, or preservatives pregnant or trying to get pregnant breast-feeding How should I use this medication? This medicine is for injection into a muscle or into a  vein. It is given by a health care professional in a hospital or clinic setting. Talk to your pediatrician regarding the use of this medicine in children. Special care may be needed. Overdosage: If you think you have taken too much of this medicine contact a poison control center or emergency room at once. NOTE: This medicine is only for you. Do not share this medicine with others. What if I miss a dose? This does not apply. What may interact with this medication? capecitabine fluorouracil phenobarbital phenytoin primidone trimethoprim-sulfamethoxazole This list may not describe all possible interactions. Give your health care provider a list of all the medicines, herbs, non-prescription drugs, or dietary supplements you use. Also tell them if you smoke, drink alcohol, or use illegal drugs. Some items may interact with your medicine. What should I watch for while using this medication? Your condition will be monitored carefully while you are receiving this medicine. This medicine may increase the side effects of 5-fluorouracil, 5-FU. Tell your doctor or health care professional if you have diarrhea or mouth sores that do not get better or that get worse. What side effects may I notice from receiving this medication? Side effects that you should report to your doctor or health care professional as soon as possible: allergic reactions like skin rash, itching or hives, swelling of the face, lips, or tongue breathing problems fever, infection mouth sores unusual bleeding or bruising unusually weak or tired Side effects that usually do not require medical attention (report to your doctor or health care professional if they continue or are bothersome): constipation or diarrhea loss of appetite nausea, vomiting This list may not describe all possible side effects. Call your doctor for medical advice about side effects. You may report side effects to FDA at 1-800-FDA-1088. Where should I keep  my medication? This drug is given in a hospital or clinic and will not be stored at home. NOTE: This sheet is a summary. It may not cover all possible information. If you have questions about this medicine, talk to your doctor, pharmacist, or health care provider.  2022 Elsevier/Gold Standard (2007-07-25 00:00:00)  Fluorouracil, 5-FU injection What is this medication? FLUOROURACIL, 5-FU (flure oh YOOR a sil) is a chemotherapy drug. It slows the growth of cancer cells. This medicine is used to treat many types of cancer like breast cancer, colon or rectal cancer, pancreatic cancer, and stomach cancer. This medicine may be used for other purposes; ask your health care provider or pharmacist if you have questions. COMMON BRAND NAME(S): Adrucil What should I tell my care team before I  take this medication? They need to know if you have any of these conditions: blood disorders dihydropyrimidine dehydrogenase (DPD) deficiency infection (especially a virus infection such as chickenpox, cold sores, or herpes) kidney disease liver disease malnourished, poor nutrition recent or ongoing radiation therapy an unusual or allergic reaction to fluorouracil, other chemotherapy, other medicines, foods, dyes, or preservatives pregnant or trying to get pregnant breast-feeding How should I use this medication? This drug is given as an infusion or injection into a vein. It is administered in a hospital or clinic by a specially trained health care professional. Talk to your pediatrician regarding the use of this medicine in children. Special care may be needed. Overdosage: If you think you have taken too much of this medicine contact a poison control center or emergency room at once. NOTE: This medicine is only for you. Do not share this medicine with others. What if I miss a dose? It is important not to miss your dose. Call your doctor or health care professional if you are unable to keep an  appointment. What may interact with this medication? Do not take this medicine with any of the following medications: live virus vaccines This medicine may also interact with the following medications: medicines that treat or prevent blood clots like warfarin, enoxaparin, and dalteparin This list may not describe all possible interactions. Give your health care provider a list of all the medicines, herbs, non-prescription drugs, or dietary supplements you use. Also tell them if you smoke, drink alcohol, or use illegal drugs. Some items may interact with your medicine. What should I watch for while using this medication? Visit your doctor for checks on your progress. This drug may make you feel generally unwell. This is not uncommon, as chemotherapy can affect healthy cells as well as cancer cells. Report any side effects. Continue your course of treatment even though you feel ill unless your doctor tells you to stop. In some cases, you may be given additional medicines to help with side effects. Follow all directions for their use. Call your doctor or health care professional for advice if you get a fever, chills or sore throat, or other symptoms of a cold or flu. Do not treat yourself. This drug decreases your body's ability to fight infections. Try to avoid being around people who are sick. This medicine may increase your risk to bruise or bleed. Call your doctor or health care professional if you notice any unusual bleeding. Be careful brushing and flossing your teeth or using a toothpick because you may get an infection or bleed more easily. If you have any dental work done, tell your dentist you are receiving this medicine. Avoid taking products that contain aspirin, acetaminophen, ibuprofen, naproxen, or ketoprofen unless instructed by your doctor. These medicines may hide a fever. Do not become pregnant while taking this medicine. Women should inform their doctor if they wish to become pregnant  or think they might be pregnant. There is a potential for serious side effects to an unborn child. Talk to your health care professional or pharmacist for more information. Do not breast-feed an infant while taking this medicine. Men should inform their doctor if they wish to father a child. This medicine may lower sperm counts. Do not treat diarrhea with over the counter products. Contact your doctor if you have diarrhea that lasts more than 2 days or if it is severe and watery. This medicine can make you more sensitive to the sun. Keep out of the sun. If  you cannot avoid being in the sun, wear protective clothing and use sunscreen. Do not use sun lamps or tanning beds/booths. What side effects may I notice from receiving this medication? Side effects that you should report to your doctor or health care professional as soon as possible: allergic reactions like skin rash, itching or hives, swelling of the face, lips, or tongue low blood counts - this medicine may decrease the number of white blood cells, red blood cells and platelets. You may be at increased risk for infections and bleeding. signs of infection - fever or chills, cough, sore throat, pain or difficulty passing urine signs of decreased platelets or bleeding - bruising, pinpoint red spots on the skin, black, tarry stools, blood in the urine signs of decreased red blood cells - unusually weak or tired, fainting spells, lightheadedness breathing problems changes in vision chest pain mouth sores nausea and vomiting pain, swelling, redness at site where injected pain, tingling, numbness in the hands or feet redness, swelling, or sores on hands or feet stomach pain unusual bleeding Side effects that usually do not require medical attention (report to your doctor or health care professional if they continue or are bothersome): changes in finger or toe nails diarrhea dry or itchy skin hair loss headache loss of appetite sensitivity  of eyes to the light stomach upset unusually teary eyes This list may not describe all possible side effects. Call your doctor for medical advice about side effects. You may report side effects to FDA at 1-800-FDA-1088. Where should I keep my medication? This drug is given in a hospital or clinic and will not be stored at home. NOTE: This sheet is a summary. It may not cover all possible information. If you have questions about this medicine, talk to your doctor, pharmacist, or health care provider.  2022 Elsevier/Gold Standard (2020-10-05 00:00:00)

## 2021-03-01 NOTE — Progress Notes (Signed)
Patient seen by Ned Card NP today  Vitals are within treatment parameters.  Labs reviewed by Ned Card NP and are not all within treatment parameters. OK to treat with platelet count 98,000.  K+ 3.4--she has missed a few doses. No need for IV replacement.  Per physician team, patient is ready for treatment and there are NO modifications to the treatment plan.

## 2021-03-03 ENCOUNTER — Inpatient Hospital Stay: Payer: Medicare Other | Attending: Oncology

## 2021-03-03 ENCOUNTER — Other Ambulatory Visit: Payer: Self-pay

## 2021-03-03 VITALS — BP 174/92 | HR 83 | Temp 99.0°F | Resp 20

## 2021-03-03 DIAGNOSIS — E039 Hypothyroidism, unspecified: Secondary | ICD-10-CM | POA: Insufficient documentation

## 2021-03-03 DIAGNOSIS — D709 Neutropenia, unspecified: Secondary | ICD-10-CM | POA: Insufficient documentation

## 2021-03-03 DIAGNOSIS — K1230 Oral mucositis (ulcerative), unspecified: Secondary | ICD-10-CM | POA: Insufficient documentation

## 2021-03-03 DIAGNOSIS — Z5111 Encounter for antineoplastic chemotherapy: Secondary | ICD-10-CM | POA: Insufficient documentation

## 2021-03-03 DIAGNOSIS — D696 Thrombocytopenia, unspecified: Secondary | ICD-10-CM | POA: Insufficient documentation

## 2021-03-03 DIAGNOSIS — C20 Malignant neoplasm of rectum: Secondary | ICD-10-CM | POA: Insufficient documentation

## 2021-03-03 MED ORDER — SODIUM CHLORIDE 0.9% FLUSH
10.0000 mL | INTRAVENOUS | Status: DC | PRN
  Administered 2021-03-03: 10 mL

## 2021-03-03 MED ORDER — HEPARIN SOD (PORK) LOCK FLUSH 100 UNIT/ML IV SOLN
500.0000 [IU] | Freq: Once | INTRAVENOUS | Status: AC | PRN
  Administered 2021-03-03: 500 [IU]

## 2021-03-20 ENCOUNTER — Other Ambulatory Visit: Payer: Self-pay | Admitting: Oncology

## 2021-03-22 ENCOUNTER — Inpatient Hospital Stay: Payer: Medicare Other

## 2021-03-22 ENCOUNTER — Inpatient Hospital Stay (HOSPITAL_BASED_OUTPATIENT_CLINIC_OR_DEPARTMENT_OTHER): Payer: Medicare Other

## 2021-03-22 ENCOUNTER — Inpatient Hospital Stay: Payer: Medicare Other | Admitting: Oncology

## 2021-03-22 ENCOUNTER — Encounter: Payer: Self-pay | Admitting: *Deleted

## 2021-03-22 ENCOUNTER — Other Ambulatory Visit: Payer: Self-pay

## 2021-03-22 VITALS — BP 156/77 | HR 75 | Resp 18

## 2021-03-22 VITALS — BP 159/74 | HR 92 | Temp 98.3°F | Resp 18 | Ht 63.0 in | Wt 216.2 lb

## 2021-03-22 DIAGNOSIS — C20 Malignant neoplasm of rectum: Secondary | ICD-10-CM | POA: Diagnosis not present

## 2021-03-22 DIAGNOSIS — Z5111 Encounter for antineoplastic chemotherapy: Secondary | ICD-10-CM | POA: Diagnosis not present

## 2021-03-22 LAB — CBC WITH DIFFERENTIAL (CANCER CENTER ONLY)
Abs Immature Granulocytes: 0.04 10*3/uL (ref 0.00–0.07)
Basophils Absolute: 0 10*3/uL (ref 0.0–0.1)
Basophils Relative: 1 %
Eosinophils Absolute: 0.1 10*3/uL (ref 0.0–0.5)
Eosinophils Relative: 5 %
HCT: 31.8 % — ABNORMAL LOW (ref 36.0–46.0)
Hemoglobin: 10.5 g/dL — ABNORMAL LOW (ref 12.0–15.0)
Immature Granulocytes: 1 %
Lymphocytes Relative: 20 %
Lymphs Abs: 0.6 10*3/uL — ABNORMAL LOW (ref 0.7–4.0)
MCH: 33.2 pg (ref 26.0–34.0)
MCHC: 33 g/dL (ref 30.0–36.0)
MCV: 100.6 fL — ABNORMAL HIGH (ref 80.0–100.0)
Monocytes Absolute: 0.6 10*3/uL (ref 0.1–1.0)
Monocytes Relative: 20 %
Neutro Abs: 1.7 10*3/uL (ref 1.7–7.7)
Neutrophils Relative %: 53 %
Platelet Count: 92 10*3/uL — ABNORMAL LOW (ref 150–400)
RBC: 3.16 MIL/uL — ABNORMAL LOW (ref 3.87–5.11)
RDW: 14.4 % (ref 11.5–15.5)
WBC Count: 3.1 10*3/uL — ABNORMAL LOW (ref 4.0–10.5)
nRBC: 0 % (ref 0.0–0.2)

## 2021-03-22 LAB — CMP (CANCER CENTER ONLY)
ALT: 21 U/L (ref 0–44)
AST: 29 U/L (ref 15–41)
Albumin: 3.4 g/dL — ABNORMAL LOW (ref 3.5–5.0)
Alkaline Phosphatase: 50 U/L (ref 38–126)
Anion gap: 9 (ref 5–15)
BUN: 12 mg/dL (ref 8–23)
CO2: 24 mmol/L (ref 22–32)
Calcium: 8.8 mg/dL — ABNORMAL LOW (ref 8.9–10.3)
Chloride: 109 mmol/L (ref 98–111)
Creatinine: 0.71 mg/dL (ref 0.44–1.00)
GFR, Estimated: >60 mL/min (ref >60)
Glucose, Bld: 110 mg/dL — ABNORMAL HIGH (ref 70–99)
Potassium: 3.6 mmol/L (ref 3.5–5.1)
Sodium: 142 mmol/L (ref 135–145)
Total Bilirubin: 0.6 mg/dL (ref 0.3–1.2)
Total Protein: 5.7 g/dL — ABNORMAL LOW (ref 6.5–8.1)

## 2021-03-22 MED ORDER — SODIUM CHLORIDE 0.9 % IV SOLN
1800.0000 mg/m2 | INTRAVENOUS | Status: DC
  Administered 2021-03-22: 3700 mg via INTRAVENOUS
  Filled 2021-03-22: qty 74

## 2021-03-22 MED ORDER — DEXAMETHASONE SODIUM PHOSPHATE 10 MG/ML IJ SOLN
5.0000 mg | Freq: Once | INTRAMUSCULAR | Status: AC
  Administered 2021-03-22: 5 mg via INTRAVENOUS
  Filled 2021-03-22: qty 1

## 2021-03-22 MED ORDER — PALONOSETRON HCL INJECTION 0.25 MG/5ML
0.2500 mg | Freq: Once | INTRAVENOUS | Status: AC
  Administered 2021-03-22: 0.25 mg via INTRAVENOUS
  Filled 2021-03-22: qty 5

## 2021-03-22 MED ORDER — FLUOROURACIL CHEMO INJECTION 2.5 GM/50ML
300.0000 mg/m2 | Freq: Once | INTRAVENOUS | Status: AC
  Administered 2021-03-22: 600 mg via INTRAVENOUS
  Filled 2021-03-22: qty 12

## 2021-03-22 MED ORDER — SODIUM CHLORIDE 0.9 % IV SOLN
300.0000 mg/m2 | Freq: Once | INTRAVENOUS | Status: AC
  Administered 2021-03-22: 618 mg via INTRAVENOUS
  Filled 2021-03-22: qty 30.9

## 2021-03-22 MED ORDER — SODIUM CHLORIDE 0.9 % IV SOLN
135.0000 mg/m2 | Freq: Once | INTRAVENOUS | Status: AC
  Administered 2021-03-22: 280 mg via INTRAVENOUS
  Filled 2021-03-22: qty 14

## 2021-03-22 MED ORDER — SODIUM CHLORIDE 0.9 % IV SOLN: Freq: Once | INTRAVENOUS | Status: AC

## 2021-03-22 MED ORDER — ATROPINE SULFATE 1 MG/ML IV SOLN
0.5000 mg | Freq: Once | INTRAVENOUS | Status: AC | PRN
  Administered 2021-03-22: 0.5 mg via INTRAVENOUS
  Filled 2021-03-22: qty 1

## 2021-03-22 NOTE — Progress Notes (Signed)
Patient presents for treatment. RN assessment completed along with the following:  Labs/vitals reviewed - Yes, and per Dr. Benay Spice, ok to treat with platelets 92.     Weight within 10% of previous measurement - Yes Informed consent completed and reflects current therapy/intent - Yes, on date 10/12/20             Provider progress note reviewed - Yes, today's provider note was reviewed. Treatment/Antibody/Supportive plan reviewed - Yes, and there are no adjustments needed for today's treatment. S&H and other orders reviewed - Yes, and there are no additional orders identified. Previous treatment date reviewed - Yes, and the appropriate amount of time has elapsed between treatments. Clinic Hand Off Received from - Merceda Elks, RN  Patient to proceed with treatment.

## 2021-03-22 NOTE — Progress Notes (Signed)
[facility] OFFICE PROGRESS NOTE   Diagnosis: Rectal cancer  INTERVAL HISTORY:   Ms. Samantha Lutz complete another cycle FOLFIRI on 03/01/2021.  No nausea/vomiting, mouth sores, or diarrhea.  She has stable exertional dyspnea.  No new complaint.  Objective:  Vital signs in last 24 hours:  Blood pressure (!) 159/74, pulse 92, temperature 98.3 F (36.8 C), temperature source Oral, resp. rate 18, height '5\' 3"'  (1.6 m), weight 216 lb 3.2 oz (98.1 kg), SpO2 95 %.    HEENT: No thrush or ulcers Resp: Lungs with decreased breath sounds at the right lower posterior chest, no respiratory distress Cardio: Regular rate and rhythm GI: No hepatosplenomegaly Vascular: No leg edema Skin: Palms without erythema  Portacath/PICC-without erythema  Lab Results:  Lab Results  Component Value Date   WBC 3.1 (L) 03/22/2021   HGB 10.5 (L) 03/22/2021   HCT 31.8 (L) 03/22/2021   MCV 100.6 (H) 03/22/2021   PLT 92 (L) 03/22/2021   NEUTROABS 1.7 03/22/2021    CMP  Lab Results  Component Value Date   NA 143 03/01/2021   K 3.4 (L) 03/01/2021   CL 108 03/01/2021   CO2 25 03/01/2021   GLUCOSE 93 03/01/2021   BUN 14 03/01/2021   CREATININE 0.84 03/01/2021   CALCIUM 8.7 (L) 03/01/2021   PROT 6.3 (L) 03/01/2021   ALBUMIN 3.6 03/01/2021   AST 21 03/01/2021   ALT 15 03/01/2021   ALKPHOS 53 03/01/2021   BILITOT 0.8 03/01/2021   GFRNONAA >60 03/01/2021   GFRAA >60 10/28/2019    Lab Results  Component Value Date   CEA1 <1.00 06/01/2020   CEA <1.00 06/01/2020     Medications: I have reviewed the patient's current medications.   Assessment/Plan: Rectal cancer Mass at 7 cm from the anal verge on colonoscopy 08/09/2017, biopsy revealed invasive adenocarcinoma Staging CTs 08/17/2017-no evidence of metastatic disease, asymmetric thickening in the mid rectum MR pelvis 09/01/2017, T3N0 lesion beginning at 6.3 cm from the anal sphincter Radiation/Xeloda initiated 09/17/2017, completed  10/25/2017 Low anterior resection/diverting ileostomy 12/21/2017,ypT3,ypN1a tumor.  Lymphovascular invasion present, intact mismatch repair protein expression, treatment effect present (TRS 1).  Mismatch repair protein IHC normal; Foundation 1-KRAS/NRAS wild-type, microsatellite status and tumor mutational burden could not be determined. Cycle 1 adjuvant Xeloda beginning 01/21/2018 Cycle 2 adjuvant Xeloda beginning 02/11/2018 Xeloda discontinued after cycle 2 secondary to patient preference Ileostomy takedown 07/10/2018 CTs 09/07/2018- multiple live small pulmonary nodules concerning for metastatic disease CT chest 12/10/2018-multiple bilateral lung nodules, some have increased in size CT chest 04/08/2019-mild enlargement of bilateral lung nodules Status post SBRT multiple lung nodules 05/06/2019, 05/08/2019, 05/13/2019 CT chest 08/28/2019-improvement and resolution in majority of right-sided pulmonary nodules, a superior segment right lower lobe nodule has increased, no new right-sided nodules.  Progressive enlargement of left-sided pulmonary nodules PET scan 09/09/2019-hypermetabolic right lower lobe nodule, 2 hypermetabolic left upper lobe nodules with an additional 0.7 cm left upper lobe nodule below PET resolution, groundglass opacity in the mid to right lower lobe with associated hypermetabolism consistent with postradiation change, hypermetabolic central segment 4A liver lesion Cycle 1 FOLFOX 10/14/2019 Cycle  2FOLFOX 11/11/2019, 5-FU and oxaliplatin dose reduced secondary to neutropenia and thrombocytopenia following cycle one, G-CSF declined Cycle 3 FOLFOX 11/26/2019 Cycle 4 FOLFOX 12/11/2019, oxaliplatin held due to neutropenia and thrombocytopenia Cycle 5 FOLFOX 12/31/2019 Cycle 6 FOLFOX 01/14/2020 (oxaliplatin held, 5-fluorouracil dose reduced due to mucositis) CT chest 01/27/2020-improvement in left lung nodules, progressive airspace disease with traction bronchiectasis throughout the right lung,  previously noted right lung nodules are obscured, mildly enlarged right paratracheal lymph nodes-not hypermetabolic on prior PET, likely reactive Cycle 7 FOLFOX 01/28/2020 Cycle 8 FOLFOX 02/11/2020 (oxaliplatin held due to neutropenia and thrombocytopenia) Cycle 9 FOLFOX 02/25/2020 (oxaliplatin held due to thrombocytopenia) Cycle 10 FOLFOX 03/11/2020 (oxaliplatin held secondary to neuropathy) Cycle 11 FOLFOX 03/25/2020 (oxaliplatin held due to neuropathy) CT chest 04/05/2020-no new or progressive findings.  Interval evolution of presumed postradiation scarring in the right perihilar lung with decrease in the more diffuse groundglass opacity seen previously.  No substantial change in left lung nodules. 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  No definite CT findings to suggest recurrent tumor.  Stable small left upper lobe pulmonary nodules.  No new or progressive findings.  Stable small right paratracheal lymph nodes.  Stable area of irregular low-attenuation in hepatic segment 4A, site of known prior hepatic metastatic lesion.  No new or progressive findings. Cycle 18 5-fluorouracil 07/06/2020 Cycle 19 5-fluorouracil 07/20/2020 Cycle 20 5-fluorouracil 08/03/2020 Cycle 21 5-fluorouracil 08/17/2020  Cycle 22 5-fluorouracil 08/31/2020 Cycle 23 5-fluorouracil 09/14/2020 CT chest 09/24/2020-increased number and size of pulmonary nodules bilaterally.  Findings in the right upper lobe are concerning for potential lymphangitic spread of tumor.  Right upper lobe findings could also reflect evolving postradiation change. Cycle 1 FOLFIRI 10/12/2020 Cycle 2 FOLFIRI 11/08/2020, irinotecan dose reduced due to neutropenia and  thrombocytopenia following cycle 1 Cycle 3 FOLFIRI 12/01/2020 Cycle 4 FOLFIRI 12/22/2020 CT chest 01/11/2021-similar bilateral pulmonary nodules.  Left upper lobe nodule has mildly decreased in size.  No new nodules. Cycle 5 FOLFIRI 01/12/2021 Cycle 6 FOLFIRI 02/03/2021 Cycle 7 FOLFIRI 03/01/2021 Cycle 8 FOLFIRI 03/22/2021   2.   Hypothyroid   3.    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  Port-A-Cath placement 09/29/2019, interventional radiology   5.  Neutropenia and thrombocytopenia following cycle 1 FOLFOX-plan chemotherapy dose reductions, she declined G-CSF   6.  Mucositis following cycle 5 FOLFOX, 5-fluorouracil dose reduced with cycle 6 Mucositis following cycle 22 5-fluorouracil-Magic mouthwash added   7.  Right lung airspace disease/volume loss-likely toxicity from chest radiation, trial of prednisone 01/29/2020; dyspnea and cough improved 02/11/2020 Progressive cough 03/11/2020-prednisone resumed at a dose of 20 mg daily Cough and dyspnea improved 03/25/2020-prednisone taper to 15 mg daily Improved 04/06/2020-prednisone taper to 10 mg daily Stable 04/21/2020-prednisone taper to 5 mg daily    Disposition: Ms. Reily appears stable.  She is tolerating the FOLFIRI well.  She will complete another cycle today.  She will undergo restaging chest CT prior to an office visit in 3 weeks.  Betsy Coder, MD  03/22/2021  9:34 AM

## 2021-03-22 NOTE — Patient Instructions (Addendum)
[place]  The chemotherapy medication bag should finish at 46 hours, 96 hours, or 7 days. For example, if your pump is scheduled for 46 hours and it was put on at 4:00 p.m., it should finish at 2:00 p.m. the day it is scheduled to come off regardless of your appointment time.     Estimated time to finish at 11am on 03/24/21.   If the display on your pump reads "Low Volume" and it is beeping, take the batteries out of the pump and come to the cancer center for it to be taken off.   If the pump alarms go off prior to the pump reading "Low Volume" then call [phone] and someone can assist you.  If the plunger comes out and the chemotherapy medication is leaking out, please use your home chemo spill kit to clean up the spill. Do NOT use paper towels or other household products.  If you have problems or questions regarding your pump, please call either [phone] (24 hours a day) or the cancer center Monday-Friday 8:00 a.m.- 4:30 p.m. at the clinic number and we will assist you. If you are unable to get assistance, then go to the nearest Emergency Department and ask the staff to contact the IV team for assistance.   Discharge Instructions: Thank you for choosing [facility] to provide your oncology and hematology care.   If you have a lab appointment with the [facility], please go directly to the [facility] and check in at the registration area.   Wear comfortable clothing and clothing appropriate for easy access to any Portacath or PICC line.   We strive to give you quality time with your provider. You may need to reschedule your appointment if you arrive late (15 or more minutes).  Arriving late affects you and other patients whose appointments are after yours.  Also, if you miss three or more appointments without notifying the office, you may be dismissed from the clinic at the providers discretion.      For prescription refill requests,  have your pharmacy contact our office and allow 72 hours for refills to be completed.    Today you received the following chemotherapy and/or immunotherapy agents Irinotecan, leucovorin, fluorouracil      To help prevent nausea and vomiting after your treatment, we encourage you to take your nausea medication as directed.  BELOW ARE SYMPTOMS THAT SHOULD BE REPORTED IMMEDIATELY: *FEVER GREATER THAN 100.4 F (38 C) OR HIGHER *CHILLS OR SWEATING *NAUSEA AND VOMITING THAT IS NOT CONTROLLED WITH YOUR NAUSEA MEDICATION *UNUSUAL SHORTNESS OF BREATH *UNUSUAL BRUISING OR BLEEDING *URINARY PROBLEMS (pain or burning when urinating, or frequent urination) *BOWEL PROBLEMS (unusual diarrhea, constipation, pain near the anus) TENDERNESS IN MOUTH AND THROAT WITH OR WITHOUT PRESENCE OF ULCERS (sore throat, sores in mouth, or a toothache) UNUSUAL RASH, SWELLING OR PAIN  UNUSUAL VAGINAL DISCHARGE OR ITCHING   Items with * indicate a potential emergency and should be followed up as soon as possible or go to the Emergency Department if any problems should occur.  Please show the CHEMOTHERAPY ALERT CARD or IMMUNOTHERAPY ALERT CARD at check-in to the Emergency Department and triage nurse.  Should you have questions after your visit or need to cancel or reschedule your appointment, please contact [facility]  Dept: [phone]  and follow the prompts.  Office hours are 8:00 a.m. to 4:30 p.m. Monday - Friday. Please note that voicemails left after  4:00 p.m. may not be returned until the following business day.  We are closed weekends and major holidays. You have access to a nurse at all times for urgent questions. Please call the main number to the clinic Dept: [phone] and follow the prompts.   For any non-urgent questions, you may also contact your provider using MyChart. We now offer e-Visits for anyone 35 and older to request care online for non-urgent symptoms. For details visit  [email or web address].   Also download the MyChart app! Go to the app store, search "MyChart", open the app, select [facility], and log in with your MyChart username and password.  Due to Covid, a mask is required upon entering the hospital/clinic. If you do not have a mask, one will be given to you upon arrival. For doctor visits, patients may have 1 support person aged 54 or older with them. For treatment visits, patients cannot have anyone with them due to current Covid guidelines and our immunocompromised population.   Irinotecan injection What is this medication? IRINOTECAN (ir in oh TEE kan ) is a chemotherapy drug. It is used to treat colon and rectal cancer. This medicine may be used for other purposes; ask your health care provider or pharmacist if you have questions. COMMON BRAND NAME(S): Camptosar What should I tell my care team before I take this medication? They need to know if you have any of these conditions: dehydration diarrhea infection (especially a virus infection such as chickenpox, cold sores, or herpes) liver disease low blood counts, like low white cell, platelet, or red cell counts low levels of calcium, magnesium, or potassium in the blood recent or ongoing radiation therapy an unusual or allergic reaction to irinotecan, other medicines, foods, dyes, or preservatives pregnant or trying to get pregnant breast-feeding How should I use this medication? This drug is given as an infusion into a vein. It is administered in a hospital or clinic by a specially trained health care professional. Talk to your pediatrician regarding the use of this medicine in children. Special care may be needed. Overdosage: If you think you have taken too much of this medicine contact a poison control center or emergency room at once. NOTE: This medicine is only for you. Do not share this medicine with others. What if I miss a dose? It is important not to miss your dose. Call your  doctor or health care professional if you are unable to keep an appointment. What may interact with this medication? Do not take this medicine with any of the following medications: cobicistat itraconazole This medicine may interact with the following medications: antiviral medicines for HIV or AIDS certain antibiotics like rifampin or rifabutin certain medicines for fungal infections like ketoconazole, posaconazole, and voriconazole certain medicines for seizures like carbamazepine, phenobarbital, phenotoin clarithromycin gemfibrozil nefazodone [facility]. John's Wort This list may not describe all possible interactions. Give your health care provider a list of all the medicines, herbs, non-prescription drugs, or dietary supplements you use. Also tell them if you smoke, drink alcohol, or use illegal drugs. Some items may interact with your medicine. What should I watch for while using this medication? Your condition will be monitored carefully while you are receiving this medicine. You will need important blood work done while you are taking this medicine. This drug may make you feel generally unwell. This is not uncommon, as chemotherapy can affect healthy cells as well as cancer cells. Report any side effects. Continue your course of treatment even though you feel ill  unless your doctor tells you to stop. In some cases, you may be given additional medicines to help with side effects. Follow all directions for their use. You may get drowsy or dizzy. Do not drive, use machinery, or do anything that needs mental alertness until you know how this medicine affects you. Do not stand or sit up quickly, especially if you are an older patient. This reduces the risk of dizzy or fainting spells. Call your health care professional for advice if you get a fever, chills, or sore throat, or other symptoms of a cold or flu. Do not treat yourself. This medicine decreases your body's ability to fight infections. Try to  avoid being around people who are sick. Avoid taking products that contain aspirin, acetaminophen, ibuprofen, naproxen, or ketoprofen unless instructed by your doctor. These medicines may hide a fever. This medicine may increase your risk to bruise or bleed. Call your doctor or health care professional if you notice any unusual bleeding. Be careful brushing and flossing your teeth or using a toothpick because you may get an infection or bleed more easily. If you have any dental work done, tell your dentist you are receiving this medicine. Do not become pregnant while taking this medicine or for 6 months after stopping it. Women should inform their health care professional if they wish to become pregnant or think they might be pregnant. Men should not father a child while taking this medicine and for 3 months after stopping it. There is potential for serious side effects to an unborn child. Talk to your health care professional for more information. Do not breast-feed an infant while taking this medicine or for 7 days after stopping it. This medicine has caused ovarian failure in some women. This medicine may make it more difficult to get pregnant. Talk to your health care professional if you are concerned about your fertility. This medicine has caused decreased sperm counts in some men. This may make it more difficult to father a child. Talk to your health care professional if you are concerned about your fertility. What side effects may I notice from receiving this medication? Side effects that you should report to your doctor or health care professional as soon as possible: allergic reactions like skin rash, itching or hives, swelling of the face, lips, or tongue chest pain diarrhea flushing, runny nose, sweating during infusion low blood counts - this medicine may decrease the number of white blood cells, red blood cells and platelets. You may be at increased risk for infections and  bleeding. nausea, vomiting pain, swelling, warmth in the leg signs of decreased platelets or bleeding - bruising, pinpoint red spots on the skin, black, tarry stools, blood in the urine signs of infection - fever or chills, cough, sore throat, pain or difficulty passing urine signs of decreased red blood cells - unusually weak or tired, fainting spells, lightheadedness Side effects that usually do not require medical attention (report to your doctor or health care professional if they continue or are bothersome): constipation hair loss headache loss of appetite mouth sores stomach pain This list may not describe all possible side effects. Call your doctor for medical advice about side effects. You may report side effects to FDA at 1-800-FDA-1088. Where should I keep my medication? This drug is given in a hospital or clinic and will not be stored at home. NOTE: This sheet is a summary. It may not cover all possible information. If you have questions about this medicine, talk to  your doctor, pharmacist, or health care provider.  2022 Elsevier/Gold Standard (2020-10-05 00:00:00)  Leucovorin injection What is this medication? LEUCOVORIN (loo koe VOR in) is used to prevent or treat the harmful effects of some medicines. This medicine is used to treat anemia caused by a low amount of folic acid in the body. It is also used with 5-fluorouracil (5-FU) to treat colon cancer. This medicine may be used for other purposes; ask your health care provider or pharmacist if you have questions. What should I tell my care team before I take this medication? They need to know if you have any of these conditions: anemia from low levels of vitamin B-12 in the blood an unusual or allergic reaction to leucovorin, folic acid, other medicines, foods, dyes, or preservatives pregnant or trying to get pregnant breast-feeding How should I use this medication? This medicine is for injection into a muscle or into a  vein. It is given by a health care professional in a hospital or clinic setting. Talk to your pediatrician regarding the use of this medicine in children. Special care may be needed. Overdosage: If you think you have taken too much of this medicine contact a poison control center or emergency room at once. NOTE: This medicine is only for you. Do not share this medicine with others. What if I miss a dose? This does not apply. What may interact with this medication? capecitabine fluorouracil phenobarbital phenytoin primidone trimethoprim-sulfamethoxazole This list may not describe all possible interactions. Give your health care provider a list of all the medicines, herbs, non-prescription drugs, or dietary supplements you use. Also tell them if you smoke, drink alcohol, or use illegal drugs. Some items may interact with your medicine. What should I watch for while using this medication? Your condition will be monitored carefully while you are receiving this medicine. This medicine may increase the side effects of 5-fluorouracil, 5-FU. Tell your doctor or health care professional if you have diarrhea or mouth sores that do not get better or that get worse. What side effects may I notice from receiving this medication? Side effects that you should report to your doctor or health care professional as soon as possible: allergic reactions like skin rash, itching or hives, swelling of the face, lips, or tongue breathing problems fever, infection mouth sores unusual bleeding or bruising unusually weak or tired Side effects that usually do not require medical attention (report to your doctor or health care professional if they continue or are bothersome): constipation or diarrhea loss of appetite nausea, vomiting This list may not describe all possible side effects. Call your doctor for medical advice about side effects. You may report side effects to FDA at 1-800-FDA-1088. Where should I keep  my medication? This drug is given in a hospital or clinic and will not be stored at home. NOTE: This sheet is a summary. It may not cover all possible information. If you have questions about this medicine, talk to your doctor, pharmacist, or health care provider.  2022 Elsevier/Gold Standard (2007-07-25 00:00:00)  Fluorouracil, 5-FU injection What is this medication? FLUOROURACIL, 5-FU (flure oh YOOR a sil) is a chemotherapy drug. It slows the growth of cancer cells. This medicine is used to treat many types of cancer like breast cancer, colon or rectal cancer, pancreatic cancer, and stomach cancer. This medicine may be used for other purposes; ask your health care provider or pharmacist if you have questions. COMMON BRAND NAME(S): Adrucil What should I tell my care team before I  take this medication? They need to know if you have any of these conditions: blood disorders dihydropyrimidine dehydrogenase (DPD) deficiency infection (especially a virus infection such as chickenpox, cold sores, or herpes) kidney disease liver disease malnourished, poor nutrition recent or ongoing radiation therapy an unusual or allergic reaction to fluorouracil, other chemotherapy, other medicines, foods, dyes, or preservatives pregnant or trying to get pregnant breast-feeding How should I use this medication? This drug is given as an infusion or injection into a vein. It is administered in a hospital or clinic by a specially trained health care professional. Talk to your pediatrician regarding the use of this medicine in children. Special care may be needed. Overdosage: If you think you have taken too much of this medicine contact a poison control center or emergency room at once. NOTE: This medicine is only for you. Do not share this medicine with others. What if I miss a dose? It is important not to miss your dose. Call your doctor or health care professional if you are unable to keep an  appointment. What may interact with this medication? Do not take this medicine with any of the following medications: live virus vaccines This medicine may also interact with the following medications: medicines that treat or prevent blood clots like warfarin, enoxaparin, and dalteparin This list may not describe all possible interactions. Give your health care provider a list of all the medicines, herbs, non-prescription drugs, or dietary supplements you use. Also tell them if you smoke, drink alcohol, or use illegal drugs. Some items may interact with your medicine. What should I watch for while using this medication? Visit your doctor for checks on your progress. This drug may make you feel generally unwell. This is not uncommon, as chemotherapy can affect healthy cells as well as cancer cells. Report any side effects. Continue your course of treatment even though you feel ill unless your doctor tells you to stop. In some cases, you may be given additional medicines to help with side effects. Follow all directions for their use. Call your doctor or health care professional for advice if you get a fever, chills or sore throat, or other symptoms of a cold or flu. Do not treat yourself. This drug decreases your body's ability to fight infections. Try to avoid being around people who are sick. This medicine may increase your risk to bruise or bleed. Call your doctor or health care professional if you notice any unusual bleeding. Be careful brushing and flossing your teeth or using a toothpick because you may get an infection or bleed more easily. If you have any dental work done, tell your dentist you are receiving this medicine. Avoid taking products that contain aspirin, acetaminophen, ibuprofen, naproxen, or ketoprofen unless instructed by your doctor. These medicines may hide a fever. Do not become pregnant while taking this medicine. Women should inform their doctor if they wish to become pregnant  or think they might be pregnant. There is a potential for serious side effects to an unborn child. Talk to your health care professional or pharmacist for more information. Do not breast-feed an infant while taking this medicine. Men should inform their doctor if they wish to father a child. This medicine may lower sperm counts. Do not treat diarrhea with over the counter products. Contact your doctor if you have diarrhea that lasts more than 2 days or if it is severe and watery. This medicine can make you more sensitive to the sun. Keep out of the sun. If  you cannot avoid being in the sun, wear protective clothing and use sunscreen. Do not use sun lamps or tanning beds/booths. What side effects may I notice from receiving this medication? Side effects that you should report to your doctor or health care professional as soon as possible: allergic reactions like skin rash, itching or hives, swelling of the face, lips, or tongue low blood counts - this medicine may decrease the number of white blood cells, red blood cells and platelets. You may be at increased risk for infections and bleeding. signs of infection - fever or chills, cough, sore throat, pain or difficulty passing urine signs of decreased platelets or bleeding - bruising, pinpoint red spots on the skin, black, tarry stools, blood in the urine signs of decreased red blood cells - unusually weak or tired, fainting spells, lightheadedness breathing problems changes in vision chest pain mouth sores nausea and vomiting pain, swelling, redness at site where injected pain, tingling, numbness in the hands or feet redness, swelling, or sores on hands or feet stomach pain unusual bleeding Side effects that usually do not require medical attention (report to your doctor or health care professional if they continue or are bothersome): changes in finger or toe nails diarrhea dry or itchy skin hair loss headache loss of appetite sensitivity  of eyes to the light stomach upset unusually teary eyes This list may not describe all possible side effects. Call your doctor for medical advice about side effects. You may report side effects to FDA at 1-800-FDA-1088. Where should I keep my medication? This drug is given in a hospital or clinic and will not be stored at home. NOTE: This sheet is a summary. It may not cover all possible information. If you have questions about this medicine, talk to your doctor, pharmacist, or health care provider.  2022 Elsevier/Gold Standard (2020-10-05 00:00:00)

## 2021-03-22 NOTE — Progress Notes (Addendum)
Patient seen by Dr. Benay Spice today  Vitals are within treatment parameters.  Labs reviewed by Dr. Benay Spice and are not all within treatment parameters. OK to treat w/platelet count 92,000 today.  Per physician team, patient is ready for treatment and there are NO modifications to the treatment plan.   Provided patient CT appointment for 04/08/21 at 11:15/11:30 at [facility]. No prep. Managed care notified for PA process.

## 2021-03-24 ENCOUNTER — Inpatient Hospital Stay: Payer: Medicare Other

## 2021-03-24 ENCOUNTER — Other Ambulatory Visit: Payer: Self-pay

## 2021-03-24 VITALS — BP 130/64 | HR 79 | Temp 98.5°F | Resp 18

## 2021-03-24 DIAGNOSIS — Z95828 Presence of other vascular implants and grafts: Secondary | ICD-10-CM

## 2021-03-24 DIAGNOSIS — Z5111 Encounter for antineoplastic chemotherapy: Secondary | ICD-10-CM | POA: Diagnosis not present

## 2021-03-24 MED ORDER — SODIUM CHLORIDE 0.9% FLUSH
10.0000 mL | Freq: Once | INTRAVENOUS | Status: AC
  Administered 2021-03-24: 10 mL

## 2021-03-24 MED ORDER — HEPARIN SOD (PORK) LOCK FLUSH 100 UNIT/ML IV SOLN
500.0000 [IU] | Freq: Once | INTRAVENOUS | Status: AC
  Administered 2021-03-24: 500 [IU]

## 2021-03-29 ENCOUNTER — Other Ambulatory Visit: Payer: Self-pay | Admitting: Nurse Practitioner

## 2021-03-29 DIAGNOSIS — C20 Malignant neoplasm of rectum: Secondary | ICD-10-CM

## 2021-03-30 ENCOUNTER — Encounter: Payer: Self-pay | Admitting: Oncology

## 2021-04-08 ENCOUNTER — Ambulatory Visit (HOSPITAL_BASED_OUTPATIENT_CLINIC_OR_DEPARTMENT_OTHER)
Admission: RE | Admit: 2021-04-08 | Discharge: 2021-04-08 | Disposition: A | Discharge status: Home / Self Care | Payer: Medicare Other | Source: Ambulatory Visit | Attending: Oncology | Admitting: Oncology

## 2021-04-08 ENCOUNTER — Other Ambulatory Visit: Payer: Self-pay

## 2021-04-08 DIAGNOSIS — C20 Malignant neoplasm of rectum: Secondary | ICD-10-CM

## 2021-04-10 ENCOUNTER — Other Ambulatory Visit: Payer: Self-pay | Admitting: Oncology

## 2021-04-12 ENCOUNTER — Inpatient Hospital Stay: Payer: Medicare Other | Attending: Oncology

## 2021-04-12 ENCOUNTER — Inpatient Hospital Stay: Payer: Medicare Other

## 2021-04-12 ENCOUNTER — Other Ambulatory Visit: Payer: Self-pay

## 2021-04-12 ENCOUNTER — Encounter: Payer: Self-pay | Admitting: Nurse Practitioner

## 2021-04-12 ENCOUNTER — Inpatient Hospital Stay: Payer: Medicare Other | Admitting: Nurse Practitioner

## 2021-04-12 VITALS — BP 149/92 | HR 87 | Temp 98.1°F | Resp 18 | Ht 63.0 in | Wt 214.2 lb

## 2021-04-12 DIAGNOSIS — R0789 Other chest pain: Secondary | ICD-10-CM | POA: Diagnosis not present

## 2021-04-12 DIAGNOSIS — E039 Hypothyroidism, unspecified: Secondary | ICD-10-CM | POA: Diagnosis not present

## 2021-04-12 DIAGNOSIS — R531 Weakness: Secondary | ICD-10-CM | POA: Insufficient documentation

## 2021-04-12 DIAGNOSIS — C20 Malignant neoplasm of rectum: Secondary | ICD-10-CM | POA: Diagnosis not present

## 2021-04-12 DIAGNOSIS — D709 Neutropenia, unspecified: Secondary | ICD-10-CM | POA: Insufficient documentation

## 2021-04-12 DIAGNOSIS — Z5111 Encounter for antineoplastic chemotherapy: Secondary | ICD-10-CM | POA: Insufficient documentation

## 2021-04-12 DIAGNOSIS — K1230 Oral mucositis (ulcerative), unspecified: Secondary | ICD-10-CM | POA: Insufficient documentation

## 2021-04-12 DIAGNOSIS — D696 Thrombocytopenia, unspecified: Secondary | ICD-10-CM | POA: Diagnosis not present

## 2021-04-12 LAB — CBC WITH DIFFERENTIAL (CANCER CENTER ONLY)
Abs Immature Granulocytes: 0.06 10*3/uL (ref 0.00–0.07)
Basophils Absolute: 0 10*3/uL (ref 0.0–0.1)
Basophils Relative: 1 %
Eosinophils Absolute: 0.1 10*3/uL (ref 0.0–0.5)
Eosinophils Relative: 3 %
HCT: 33.2 % — ABNORMAL LOW (ref 36.0–46.0)
Hemoglobin: 11.1 g/dL — ABNORMAL LOW (ref 12.0–15.0)
Immature Granulocytes: 2 %
Lymphocytes Relative: 21 %
Lymphs Abs: 0.7 10*3/uL (ref 0.7–4.0)
MCH: 33.5 pg (ref 26.0–34.0)
MCHC: 33.4 g/dL (ref 30.0–36.0)
MCV: 100.3 fL — ABNORMAL HIGH (ref 80.0–100.0)
Monocytes Absolute: 0.8 10*3/uL (ref 0.1–1.0)
Monocytes Relative: 23 %
Neutro Abs: 1.7 10*3/uL (ref 1.7–7.7)
Neutrophils Relative %: 50 %
Platelet Count: 108 10*3/uL — ABNORMAL LOW (ref 150–400)
RBC: 3.31 MIL/uL — ABNORMAL LOW (ref 3.87–5.11)
RDW: 14.7 % (ref 11.5–15.5)
WBC Count: 3.3 10*3/uL — ABNORMAL LOW (ref 4.0–10.5)
nRBC: 0 % (ref 0.0–0.2)

## 2021-04-12 LAB — CMP (CANCER CENTER ONLY)
ALT: 16 U/L (ref 0–44)
AST: 19 U/L (ref 15–41)
Albumin: 3.6 g/dL (ref 3.5–5.0)
Alkaline Phosphatase: 53 U/L (ref 38–126)
Anion gap: 7 (ref 5–15)
BUN: 15 mg/dL (ref 8–23)
CO2: 27 mmol/L (ref 22–32)
Calcium: 9.1 mg/dL (ref 8.9–10.3)
Chloride: 106 mmol/L (ref 98–111)
Creatinine: 0.83 mg/dL (ref 0.44–1.00)
GFR, Estimated: >60 mL/min (ref >60)
Glucose, Bld: 89 mg/dL (ref 70–99)
Potassium: 3.8 mmol/L (ref 3.5–5.1)
Sodium: 140 mmol/L (ref 135–145)
Total Bilirubin: 0.7 mg/dL (ref 0.3–1.2)
Total Protein: 6 g/dL — ABNORMAL LOW (ref 6.5–8.1)

## 2021-04-12 MED ORDER — PALONOSETRON HCL INJECTION 0.25 MG/5ML
0.2500 mg | Freq: Once | INTRAVENOUS | Status: AC
  Administered 2021-04-12: 0.25 mg via INTRAVENOUS
  Filled 2021-04-12: qty 5

## 2021-04-12 MED ORDER — SODIUM CHLORIDE 0.9 % IV SOLN
1800.0000 mg/m2 | INTRAVENOUS | Status: DC
  Administered 2021-04-12: 3700 mg via INTRAVENOUS
  Filled 2021-04-12: qty 74

## 2021-04-12 MED ORDER — FLUOROURACIL CHEMO INJECTION 2.5 GM/50ML
300.0000 mg/m2 | Freq: Once | INTRAVENOUS | Status: AC
  Administered 2021-04-12: 600 mg via INTRAVENOUS
  Filled 2021-04-12: qty 12

## 2021-04-12 MED ORDER — SODIUM CHLORIDE 0.9 % IV SOLN: Freq: Once | INTRAVENOUS | Status: AC

## 2021-04-12 MED ORDER — SODIUM CHLORIDE 0.9 % IV SOLN
300.0000 mg/m2 | Freq: Once | INTRAVENOUS | Status: AC
  Administered 2021-04-12: 618 mg via INTRAVENOUS
  Filled 2021-04-12: qty 30.9

## 2021-04-12 MED ORDER — ATROPINE SULFATE 1 MG/ML IV SOLN
0.5000 mg | Freq: Once | INTRAVENOUS | Status: AC | PRN
  Administered 2021-04-12: 0.5 mg via INTRAVENOUS
  Filled 2021-04-12: qty 1

## 2021-04-12 MED ORDER — SODIUM CHLORIDE 0.9 % IV SOLN
135.0000 mg/m2 | Freq: Once | INTRAVENOUS | Status: AC
  Administered 2021-04-12: 280 mg via INTRAVENOUS
  Filled 2021-04-12: qty 10

## 2021-04-12 MED ORDER — DEXAMETHASONE SODIUM PHOSPHATE 10 MG/ML IJ SOLN
5.0000 mg | Freq: Once | INTRAMUSCULAR | Status: AC
  Administered 2021-04-12: 5 mg via INTRAVENOUS
  Filled 2021-04-12: qty 1

## 2021-04-12 NOTE — Progress Notes (Signed)
Patient presents for treatment. RN assessment completed along with the following: ? ?Labs/vitals reviewed - Yes, and within treatment parameters.   ?Weight within 10% of previous measurement - Yes ?Informed consent completed and reflects current therapy/intent - Yes, on date 10/12/20             ?Provider progress note reviewed - Yes, today's provider note was reviewed. ?Treatment/Antibody/Supportive plan reviewed - Yes, and there are no adjustments needed for today's treatment. ?S&H and other orders reviewed - Yes, and there are no additional orders identified. ?Previous treatment date reviewed - Yes, and the appropriate amount of time has elapsed between treatments. ? ? ?Patient to proceed with treatment.   ?

## 2021-04-12 NOTE — Patient Instructions (Addendum)
[place]  ?The chemotherapy medication bag should finish at 46 hours, 96 hours, or 7 days. For example, if your pump is scheduled for 46 hours and it was put on at 4:00 p.m., it should finish at 2:00 p.m. the day it is scheduled to come off regardless of your appointment time.   ?  ?Estimated time to finish at 10:45am on 04/14/21. ?  ?If the display on your pump reads "Low Volume" and it is beeping, take the batteries out of the pump and come to the cancer center for it to be taken off.  ? ?If the pump alarms go off prior to the pump reading "Low Volume" then call [phone] and someone can assist you. ? ?If the plunger comes out and the chemotherapy medication is leaking out, please use your home chemo spill kit to clean up the spill. Do NOT use paper towels or other household products. ? ?If you have problems or questions regarding your pump, please call either [phone] (24 hours a day) or the cancer center Monday-Friday 8:00 a.m.- 4:30 p.m. at the clinic number and we will assist you. If you are unable to get assistance, then go to the nearest Emergency Department and ask the staff to contact the IV team for assistance.  ? Discharge Instructions: ?Thank you for choosing [facility] to provide your oncology and hematology care.  ? ?If you have a lab appointment with the [facility], please go directly to the [facility] and check in at the registration area. ?  ?Wear comfortable clothing and clothing appropriate for easy access to any Portacath or PICC line.  ? ?We strive to give you quality time with your provider. You may need to reschedule your appointment if you arrive late (15 or more minutes).  Arriving late affects you and other patients whose appointments are after yours.  Also, if you miss three or more appointments without notifying the office, you may be dismissed from the clinic at the provider?s discretion.    ?  ?For prescription refill  requests, have your pharmacy contact our office and allow 72 hours for refills to be completed.   ? ?Today you received the following chemotherapy and/or immunotherapy agents Irinotecan, leucovorin, fluorouracil    ?  ?To help prevent nausea and vomiting after your treatment, we encourage you to take your nausea medication as directed. ? ?BELOW ARE SYMPTOMS THAT SHOULD BE REPORTED IMMEDIATELY: ?*FEVER GREATER THAN 100.4 F (38 ?C) OR HIGHER ?*CHILLS OR SWEATING ?*NAUSEA AND VOMITING THAT IS NOT CONTROLLED WITH YOUR NAUSEA MEDICATION ?*UNUSUAL SHORTNESS OF BREATH ?*UNUSUAL BRUISING OR BLEEDING ?*URINARY PROBLEMS (pain or burning when urinating, or frequent urination) ?*BOWEL PROBLEMS (unusual diarrhea, constipation, pain near the anus) ?TENDERNESS IN MOUTH AND THROAT WITH OR WITHOUT PRESENCE OF ULCERS (sore throat, sores in mouth, or a toothache) ?UNUSUAL RASH, SWELLING OR PAIN  ?UNUSUAL VAGINAL DISCHARGE OR ITCHING  ? ?Items with * indicate a potential emergency and should be followed up as soon as possible or go to the Emergency Department if any problems should occur. ? ?Please show the CHEMOTHERAPY ALERT CARD or IMMUNOTHERAPY ALERT CARD at check-in to the Emergency Department and triage nurse. ? ?Should you have questions after your visit or need to cancel or reschedule your appointment, please contact [facility]  Dept: [phone]  and follow the prompts.  Office hours are 8:00 a.m. to 4:30 p.m. Monday - Friday. Please note that voicemails left after  4:00 p.m. may not be returned until the following business day.  We are closed weekends and major holidays. You have access to a nurse at all times for urgent questions. Please call the main number to the clinic Dept: [phone] and follow the prompts. ? ? ?For any non-urgent questions, you may also contact your provider using MyChart. We now offer e-Visits for anyone 5 and older to request care online for non-urgent symptoms. For  details visit [email or web address]. ?  ?Also download the MyChart app! Go to the app store, search "MyChart", open the app, select [facility], and log in with your MyChart username and password. ? ?Due to Covid, a mask is required upon entering the hospital/clinic. If you do not have a mask, one will be given to you upon arrival. For doctor visits, patients may have 1 support person aged 36 or older with them. For treatment visits, patients cannot have anyone with them due to current Covid guidelines and our immunocompromised population.  ? ?Irinotecan injection ?What is this medication? ?IRINOTECAN (ir in oh TEE kan ) is a chemotherapy drug. It is used to treat colon and rectal cancer. ?This medicine may be used for other purposes; ask your health care provider or pharmacist if you have questions. ?COMMON BRAND NAME(S): Camptosar ?What should I tell my care team before I take this medication? ?They need to know if you have any of these conditions: ?dehydration ?diarrhea ?infection (especially a virus infection such as chickenpox, cold sores, or herpes) ?liver disease ?low blood counts, like low white cell, platelet, or red cell counts ?low levels of calcium, magnesium, or potassium in the blood ?recent or ongoing radiation therapy ?an unusual or allergic reaction to irinotecan, other medicines, foods, dyes, or preservatives ?pregnant or trying to get pregnant ?breast-feeding ?How should I use this medication? ?This drug is given as an infusion into a vein. It is administered in a hospital or clinic by a specially trained health care professional. ?Talk to your pediatrician regarding the use of this medicine in children. Special care may be needed. ?Overdosage: If you think you have taken too much of this medicine contact a poison control center or emergency room at once. ?NOTE: This medicine is only for you. Do not share this medicine with others. ?What if I miss a dose? ?It is important not to miss your dose.  Call your doctor or health care professional if you are unable to keep an appointment. ?What may interact with this medication? ?Do not take this medicine with any of the following medications: ?cobicistat ?itraconazole ?This medicine may interact with the following medications: ?antiviral medicines for HIV or AIDS ?certain antibiotics like rifampin or rifabutin ?certain medicines for fungal infections like ketoconazole, posaconazole, and voriconazole ?certain medicines for seizures like carbamazepine, phenobarbital, phenotoin ?clarithromycin ?gemfibrozil ?nefazodone ?[place] ?This list may not describe all possible interactions. Give your health care provider a list of all the medicines, herbs, non-prescription drugs, or dietary supplements you use. Also tell them if you smoke, drink alcohol, or use illegal drugs. Some items may interact with your medicine. ?What should I watch for while using this medication? ?Your condition will be monitored carefully while you are receiving this medicine. You will need important blood work done while you are taking this medicine. ?This drug may make you feel generally unwell. This is not uncommon, as chemotherapy can affect healthy cells as well as cancer cells. Report any side effects. Continue your course of treatment even though you feel ill  unless your doctor tells you to stop. ?In some cases, you may be given additional medicines to help with side effects. Follow all directions for their use. ?You may get drowsy or dizzy. Do not drive, use machinery, or do anything that needs mental alertness until you know how this medicine affects you. Do not stand or sit up quickly, especially if you are an older patient. This reduces the risk of dizzy or fainting spells. ?Call your health care professional for advice if you get a fever, chills, or sore throat, or other symptoms of a cold or flu. Do not treat yourself. This medicine decreases your body's ability to fight  infections. Try to avoid being around people who are sick. ?Avoid taking products that contain aspirin, acetaminophen, ibuprofen, naproxen, or ketoprofen unless instructed by your doctor. These medicines may hide a fev

## 2021-04-12 NOTE — Patient Instructions (Signed)

## 2021-04-12 NOTE — Progress Notes (Signed)
Patient seen by Lisa Thomas NP today  Vitals are within treatment parameters.  Labs reviewed by Lisa Thomas NP and are within treatment parameters.  Per physician team, patient is ready for treatment and there are NO modifications to the treatment plan.     

## 2021-04-12 NOTE — Progress Notes (Signed)
?[facility] ?OFFICE PROGRESS NOTE ? ? ?Diagnosis: Rectal cancer ? ?INTERVAL HISTORY:  ? ?Ms. Raval returns as scheduled.  She completed another cycle of FOLFIRI 03/22/2021.  She has mild nausea for a few days after each treatment.  Home nausea medications help.  No mouth sores.  No diarrhea.  She reports developing mid chest discomfort/pressure with swallowing and leg weakness on day 1 when she arrives home and lasting for 4 to 6 hours.  This has happened with every cycle of FOLFIRI.  No associated symptoms such as nausea.  Stable mild dyspnea on exertion. ? ?Objective: ? ?Vital signs in last 24 hours: ? ?Blood pressure (!) 149/92, pulse 87, temperature 98.1 ?F (36.7 ?C), temperature source Oral, resp. rate 18, height '5\' 3"'  (1.6 m), weight 214 lb 3.2 oz (97.2 kg), SpO2 98 %. ?  ? ?HEENT: No thrush or ulcers. ?Resp: Lungs clear bilaterally. ?Cardio: Regular rate and rhythm. ?GI: Abdomen soft and nontender.  No hepatosplenomegaly. ?Vascular: No leg edema. ?Skin: Palms without erythema. ?Port-A-Cath without erythema. ? ? ?Lab Results: ? ?Lab Results  ?Component Value Date  ? WBC 3.3 (L) 04/12/2021  ? HGB 11.1 (L) 04/12/2021  ? HCT 33.2 (L) 04/12/2021  ? MCV 100.3 (H) 04/12/2021  ? PLT 108 (L) 04/12/2021  ? NEUTROABS 1.7 04/12/2021  ? ? ?Imaging: ? ?No results found. ? ?Medications: I have reviewed the patient's current medications. ? ?Assessment/Plan: ?Rectal cancer ?Mass at 7 cm from the anal verge on colonoscopy 08/09/2017, biopsy revealed invasive adenocarcinoma ?Staging CTs 08/17/2017-no evidence of metastatic disease, asymmetric thickening in the mid rectum ?MR pelvis 09/01/2017, T3N0 lesion beginning at 6.3 cm from the anal sphincter ?Radiation/Xeloda initiated 09/17/2017, completed 10/25/2017 ?Low anterior resection/diverting ileostomy 12/21/2017,ypT3,ypN1a tumor.  Lymphovascular invasion present, intact mismatch repair protein expression, treatment effect present (TRS 1).  Mismatch repair protein  IHC normal; Foundation 1-KRAS/NRAS wild-type, microsatellite status and tumor mutational burden could not be determined. ?Cycle 1 adjuvant Xeloda beginning 01/21/2018 ?Cycle 2 adjuvant Xeloda beginning 02/11/2018 ?Xeloda discontinued after cycle 2 secondary to patient preference ?Ileostomy takedown 07/10/2018 ?CTs 09/07/2018- multiple live small pulmonary nodules concerning for metastatic disease ?CT chest 12/10/2018-multiple bilateral lung nodules, some have increased in size ?CT chest 04/08/2019-mild enlargement of bilateral lung nodules ?Status post SBRT multiple lung nodules 05/06/2019, 05/08/2019, 05/13/2019 ?CT chest 08/28/2019-improvement and resolution in majority of right-sided pulmonary nodules, a superior segment right lower lobe nodule has increased, no new right-sided nodules.  Progressive enlargement of left-sided pulmonary nodules ?PET scan 09/09/2019-hypermetabolic right lower lobe nodule, 2 hypermetabolic left upper lobe nodules with an additional 0.7 cm left upper lobe nodule below PET resolution, groundglass opacity in the mid to right lower lobe with associated hypermetabolism consistent with postradiation change, hypermetabolic central segment 4A liver lesion ?Cycle 1 FOLFOX 10/14/2019 ?Cycle  2FOLFOX 11/11/2019, 5-FU and oxaliplatin dose reduced secondary to neutropenia and thrombocytopenia following cycle one, G-CSF declined ?Cycle 3 FOLFOX 11/26/2019 ?Cycle 4 FOLFOX 12/11/2019, oxaliplatin held due to neutropenia and thrombocytopenia ?Cycle 5 FOLFOX 12/31/2019 ?Cycle 6 FOLFOX 01/14/2020 (oxaliplatin held, 5-fluorouracil dose reduced due to mucositis) ?CT chest 01/27/2020-improvement in left lung nodules, progressive airspace disease with traction bronchiectasis throughout the right lung, previously noted right lung nodules are obscured, mildly enlarged right paratracheal lymph nodes-not hypermetabolic on prior PET, likely reactive ?Cycle 7 FOLFOX 01/28/2020 ?Cycle 8 FOLFOX 02/11/2020 (oxaliplatin held due to  neutropenia and thrombocytopenia) ?Cycle 9 FOLFOX 02/25/2020 (oxaliplatin held due to thrombocytopenia) ?Cycle 10 FOLFOX 03/11/2020 (oxaliplatin held secondary to neuropathy) ?Cycle 11  FOLFOX 03/25/2020 (oxaliplatin held due to neuropathy) ?CT chest 04/05/2020-no new or progressive findings.  Interval evolution of presumed postradiation scarring in the right perihilar lung with decrease in the more diffuse groundglass opacity seen previously.  No substantial change in left lung nodules. 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  No definite CT findings to suggest recurrent tumor.  Stable small left upper lobe pulmonary nodules.  No new or progressive findings.  Stable small right paratracheal lymph nodes.  Stable area of irregular low-attenuation in hepatic segment 4A, site of known prior hepatic metastatic lesion.  No new or progressive findings. ?Cycle 18 5-fluorouracil 07/06/2020 ?Cycle 19 5-fluorouracil 07/20/2020 ?Cycle 20 5-fluorouracil 08/03/2020 ?Cycle 21 5-fluorouracil 08/17/2020  ?Cycle 22 5-fluorouracil 08/31/2020 ?Cycle 23 5-fluorouracil 09/14/2020 ?CT chest 09/24/2020-increased number and size of pulmonary nodules bilaterally.  Findings in the right upper lobe are concerning for potential lymphangitic spread of tumor.  Right upper lobe findings could also reflect evolving postradiation change. ?Cycle 1 FOLFIRI 10/12/2020 ?Cycle 2 FOLFIRI 11/08/2020, irinotecan dose reduced due to neutropenia and thrombocytopenia following cycle 1 ?Cycle 3 FOLFIRI 12/01/2020 ?Cycle 4 FOLFIRI 12/22/2020 ?CT chest 01/11/2021-similar bilateral pulmonary nodules.  Left upper lobe nodule has mildly decreased in size.  No new nodules. ?Cycle 5  FOLFIRI 01/12/2021 ?Cycle 6 FOLFIRI 02/03/2021 ?Cycle 7 FOLFIRI 03/01/2021 ?Cycle 8 FOLFIRI 03/22/2021 ?CT chest 04/08/2021-multiple small bilateral lung nodules not significantly changed.  No new nodules.  Unchanged enlarged pretracheal nodes.  Increase in fibrotic consolidation of the perihilar and inferior right lung. ?Cycle 9 FOLFIRI 04/12/2021 ?  ?2.   Hypothyroid ?  ?3.    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  ?4.  Port-A-Cath placement 09/29/2019, interventional radiology ?  ?5.  Neutropenia and thrombocytopenia following cycle 1 FOLFOX-plan chemotherapy dose reductions, she declined G-CSF ?  ?6.  Mucositis following cycle 5 FOLFOX, 5-fluorouracil dose reduced with cycle 6 ?Mucositis following cycle 22 5-fluorouracil-Magic mouthwash added ?  ?7.  Right lung airspace disease/volume loss-likely toxicity from chest radiation, trial of prednisone 01/29/2020; dyspnea and cough improved 02/11/2020 ?Progressive cough 03/11/2020-prednisone resumed at a dose of 20 mg daily ?Cough and dyspnea improved 03/25/2020-prednisone taper to 15 mg daily ?Improved 04/06/2020-prednisone taper to 10 mg daily ?Stable 04/21/2020-prednisone taper to 5 mg daily ?  ?  ? ?Disposition: Ms. Mclaine appears stable.  She has completed 8 cycles of FOLFIRI.  Restaging chest CT shows stable disease.  Results/images reviewed with her at today's visit.  Plan to proceed with cycle 9 today as scheduled. ? ?CBC and chemistry panel reviewed.  Labs adequate to proceed as above. ? ?The chest discomfort/swallowing issue and leg weakness after each treatment may be related to the irinotecan.  She will call if symptoms do not resolve as they have with previous cycles. ? ?She will return for lab, follow-up, FOLFIRI in 3 weeks.  We are available to see her sooner if needed. ? ?Patient seen with Dr. Benay Spice. ? ? ? ?Ned Card ANP/GNP-BC  ? ?04/12/2021  ?9:57 AM ? ?This was a shared visit with Ned Card.  We discussed the  restaging CT findings and reviewed the images with Ms. Maskell.  The CT is consistent with stable disease.  The plan is to continue FOLFIRI on the current schedule. ? ?I was  present for greater than 50% of

## 2021-04-14 ENCOUNTER — Inpatient Hospital Stay: Payer: Medicare Other

## 2021-04-14 ENCOUNTER — Other Ambulatory Visit: Payer: Self-pay

## 2021-05-01 ENCOUNTER — Other Ambulatory Visit: Payer: Self-pay | Admitting: Oncology

## 2021-05-04 ENCOUNTER — Inpatient Hospital Stay: Payer: Medicare Other | Admitting: Nurse Practitioner

## 2021-05-04 ENCOUNTER — Encounter: Payer: Self-pay | Admitting: Nurse Practitioner

## 2021-05-04 ENCOUNTER — Inpatient Hospital Stay: Payer: Medicare Other | Attending: Oncology

## 2021-05-04 ENCOUNTER — Inpatient Hospital Stay: Payer: Medicare Other

## 2021-05-04 ENCOUNTER — Encounter: Payer: Self-pay | Admitting: *Deleted

## 2021-05-04 VITALS — BP 150/90 | HR 88 | Temp 97.8°F | Resp 18 | Ht 63.0 in | Wt 218.2 lb

## 2021-05-04 DIAGNOSIS — E039 Hypothyroidism, unspecified: Secondary | ICD-10-CM | POA: Diagnosis not present

## 2021-05-04 DIAGNOSIS — K1230 Oral mucositis (ulcerative), unspecified: Secondary | ICD-10-CM | POA: Diagnosis not present

## 2021-05-04 DIAGNOSIS — D696 Thrombocytopenia, unspecified: Secondary | ICD-10-CM | POA: Insufficient documentation

## 2021-05-04 DIAGNOSIS — Z5111 Encounter for antineoplastic chemotherapy: Secondary | ICD-10-CM | POA: Diagnosis present

## 2021-05-04 DIAGNOSIS — D6959 Other secondary thrombocytopenia: Secondary | ICD-10-CM | POA: Insufficient documentation

## 2021-05-04 DIAGNOSIS — R21 Rash and other nonspecific skin eruption: Secondary | ICD-10-CM | POA: Diagnosis not present

## 2021-05-04 DIAGNOSIS — C20 Malignant neoplasm of rectum: Secondary | ICD-10-CM

## 2021-05-04 DIAGNOSIS — Z95828 Presence of other vascular implants and grafts: Secondary | ICD-10-CM

## 2021-05-04 DIAGNOSIS — D709 Neutropenia, unspecified: Secondary | ICD-10-CM | POA: Insufficient documentation

## 2021-05-04 LAB — CBC WITH DIFFERENTIAL (CANCER CENTER ONLY)
Abs Immature Granulocytes: 0.04 10*3/uL (ref 0.00–0.07)
Basophils Absolute: 0 10*3/uL (ref 0.0–0.1)
Basophils Relative: 1 %
Eosinophils Absolute: 0.1 10*3/uL (ref 0.0–0.5)
Eosinophils Relative: 4 %
HCT: 30.4 % — ABNORMAL LOW (ref 36.0–46.0)
Hemoglobin: 10.2 g/dL — ABNORMAL LOW (ref 12.0–15.0)
Immature Granulocytes: 1 %
Lymphocytes Relative: 19 %
Lymphs Abs: 0.6 10*3/uL — ABNORMAL LOW (ref 0.7–4.0)
MCH: 33.8 pg (ref 26.0–34.0)
MCHC: 33.6 g/dL (ref 30.0–36.0)
MCV: 100.7 fL — ABNORMAL HIGH (ref 80.0–100.0)
Monocytes Absolute: 0.7 10*3/uL (ref 0.1–1.0)
Monocytes Relative: 22 %
Neutro Abs: 1.7 10*3/uL (ref 1.7–7.7)
Neutrophils Relative %: 53 %
Platelet Count: 96 10*3/uL — ABNORMAL LOW (ref 150–400)
RBC: 3.02 MIL/uL — ABNORMAL LOW (ref 3.87–5.11)
RDW: 15.5 % (ref 11.5–15.5)
WBC Count: 3.1 10*3/uL — ABNORMAL LOW (ref 4.0–10.5)
nRBC: 0 % (ref 0.0–0.2)

## 2021-05-04 LAB — CMP (CANCER CENTER ONLY)
ALT: 15 U/L (ref 0–44)
AST: 18 U/L (ref 15–41)
Albumin: 3.5 g/dL (ref 3.5–5.0)
Alkaline Phosphatase: 51 U/L (ref 38–126)
Anion gap: 9 (ref 5–15)
BUN: 15 mg/dL (ref 8–23)
CO2: 26 mmol/L (ref 22–32)
Calcium: 8.8 mg/dL — ABNORMAL LOW (ref 8.9–10.3)
Chloride: 108 mmol/L (ref 98–111)
Creatinine: 0.8 mg/dL (ref 0.44–1.00)
GFR, Estimated: >60 mL/min (ref >60)
Glucose, Bld: 97 mg/dL (ref 70–99)
Potassium: 3.5 mmol/L (ref 3.5–5.1)
Sodium: 143 mmol/L (ref 135–145)
Total Bilirubin: 0.7 mg/dL (ref 0.3–1.2)
Total Protein: 6 g/dL — ABNORMAL LOW (ref 6.5–8.1)

## 2021-05-04 MED ORDER — SODIUM CHLORIDE 0.9% FLUSH
10.0000 mL | INTRAVENOUS | Status: AC | PRN
  Administered 2021-05-04: 10 mL via INTRAVENOUS

## 2021-05-04 MED ORDER — HEPARIN SOD (PORK) LOCK FLUSH 100 UNIT/ML IV SOLN
500.0000 [IU] | Freq: Once | INTRAVENOUS | Status: AC
  Administered 2021-05-04: 500 [IU] via INTRAVENOUS

## 2021-05-04 NOTE — Patient Instructions (Signed)

## 2021-05-04 NOTE — Addendum Note (Signed)
Addended by: Velna Hatchet on: 05/04/2021 10:03 AM ? ? Modules accepted: Orders ? ?

## 2021-05-04 NOTE — Progress Notes (Signed)
?[facility] ?OFFICE PROGRESS NOTE ? ? ?Diagnosis: Rectal cancer ? ?INTERVAL HISTORY:  ? ?Samantha Lutz returns as scheduled.  She completed cycle 9 FOLFIRI 04/12/2021.  She had nausea for a few days after chemotherapy.  She had loose stools 3 days after the chemotherapy.  Main complaint is progressive fatigue.  She has a good appetite.  She has occasional dysphagia. ? ?Objective: ? ?Vital signs in last 24 hours: ? ?Blood pressure (!) 150/90, pulse 88, temperature 97.8 ?F (36.6 ?C), temperature source Oral, resp. rate 18, height 5' 3" (1.6 m), weight 218 lb 3.2 oz (99 kg), SpO2 98 %. ?  ? ?HEENT: No thrush or ulcers. ?Resp: Lungs clear bilaterally. ?Cardio: Regular rate and rhythm. ?GI: Abdomen soft and nontender.  No hepatomegaly. ?Vascular: No leg edema. ?Skin: Palms without erythema. ?Port-A-Cath without erythema. ? ? ?Lab Results: ? ?Lab Results  ?Component Value Date  ? WBC 3.1 (L) 05/04/2021  ? HGB 10.2 (L) 05/04/2021  ? HCT 30.4 (L) 05/04/2021  ? MCV 100.7 (H) 05/04/2021  ? PLT 96 (L) 05/04/2021  ? NEUTROABS 1.7 05/04/2021  ? ? ?Imaging: ? ?No results found. ? ?Medications: I have reviewed the patient's current medications. ? ?Assessment/Plan: ?Rectal cancer ?Mass at 7 cm from the anal verge on colonoscopy 08/09/2017, biopsy revealed invasive adenocarcinoma ?Staging CTs 08/17/2017-no evidence of metastatic disease, asymmetric thickening in the mid rectum ?MR pelvis 09/01/2017, T3N0 lesion beginning at 6.3 cm from the anal sphincter ?Radiation/Xeloda initiated 09/17/2017, completed 10/25/2017 ?Low anterior resection/diverting ileostomy 12/21/2017,ypT3,ypN1a tumor.  Lymphovascular invasion present, intact mismatch repair protein expression, treatment effect present (TRS 1).  Mismatch repair protein IHC normal; Foundation 1-KRAS/NRAS wild-type, microsatellite status and tumor mutational burden could not be determined. ?Cycle 1 adjuvant Xeloda beginning 01/21/2018 ?Cycle 2 adjuvant Xeloda beginning  02/11/2018 ?Xeloda discontinued after cycle 2 secondary to patient preference ?Ileostomy takedown 07/10/2018 ?CTs 09/07/2018- multiple live small pulmonary nodules concerning for metastatic disease ?CT chest 12/10/2018-multiple bilateral lung nodules, some have increased in size ?CT chest 04/08/2019-mild enlargement of bilateral lung nodules ?Status post SBRT multiple lung nodules 05/06/2019, 05/08/2019, 05/13/2019 ?CT chest 08/28/2019-improvement and resolution in majority of right-sided pulmonary nodules, a superior segment right lower lobe nodule has increased, no new right-sided nodules.  Progressive enlargement of left-sided pulmonary nodules ?PET scan 09/09/2019-hypermetabolic right lower lobe nodule, 2 hypermetabolic left upper lobe nodules with an additional 0.7 cm left upper lobe nodule below PET resolution, groundglass opacity in the mid to right lower lobe with associated hypermetabolism consistent with postradiation change, hypermetabolic central segment 4A liver lesion ?Cycle 1 FOLFOX 10/14/2019 ?Cycle  2FOLFOX 11/11/2019, 5-FU and oxaliplatin dose reduced secondary to neutropenia and thrombocytopenia following cycle one, G-CSF declined ?Cycle 3 FOLFOX 11/26/2019 ?Cycle 4 FOLFOX 12/11/2019, oxaliplatin held due to neutropenia and thrombocytopenia ?Cycle 5 FOLFOX 12/31/2019 ?Cycle 6 FOLFOX 01/14/2020 (oxaliplatin held, 5-fluorouracil dose reduced due to mucositis) ?CT chest 01/27/2020-improvement in left lung nodules, progressive airspace disease with traction bronchiectasis throughout the right lung, previously noted right lung nodules are obscured, mildly enlarged right paratracheal lymph nodes-not hypermetabolic on prior PET, likely reactive ?Cycle 7 FOLFOX 01/28/2020 ?Cycle 8 FOLFOX 02/11/2020 (oxaliplatin held due to neutropenia and thrombocytopenia) ?Cycle 9 FOLFOX 02/25/2020 (oxaliplatin held due to thrombocytopenia) ?Cycle 10 FOLFOX 03/11/2020 (oxaliplatin held secondary to neuropathy) ?Cycle 11 FOLFOX 03/25/2020  (oxaliplatin held due to neuropathy) ?CT chest 04/05/2020-no new or progressive findings.  Interval evolution of presumed postradiation scarring in the right perihilar lung with decrease in the more diffuse groundglass opacity seen previously.    No substantial change in left lung nodules. 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  No definite CT findings to suggest recurrent tumor.  Stable small left upper lobe pulmonary nodules.  No new or progressive findings.  Stable small right paratracheal lymph nodes.  Stable area of irregular low-attenuation in hepatic segment 4A, site of known prior hepatic metastatic lesion.  No new or progressive findings. ?Cycle 18 5-fluorouracil 07/06/2020 ?Cycle 19 5-fluorouracil 07/20/2020 ?Cycle 20 5-fluorouracil 08/03/2020 ?Cycle 21 5-fluorouracil 08/17/2020  ?Cycle 22 5-fluorouracil 08/31/2020 ?Cycle 23 5-fluorouracil 09/14/2020 ?CT chest 09/24/2020-increased number and size of pulmonary nodules bilaterally.  Findings in the right upper lobe are concerning for potential lymphangitic spread of tumor.  Right upper lobe findings could also reflect evolving postradiation change. ?Cycle 1 FOLFIRI 10/12/2020 ?Cycle 2 FOLFIRI 11/08/2020, irinotecan dose reduced due to neutropenia and thrombocytopenia following cycle 1 ?Cycle 3 FOLFIRI 12/01/2020 ?Cycle 4 FOLFIRI 12/22/2020 ?CT chest 01/11/2021-similar bilateral pulmonary nodules.  Left upper lobe nodule has mildly decreased in size.  No new nodules. ?Cycle 5 FOLFIRI 01/12/2021 ?Cycle 6 FOLFIRI 02/03/2021 ?Cycle 7 FOLFIRI 03/01/2021 ?Cycle 8 FOLFIRI 03/22/2021 ?CT chest 04/08/2021-multiple small bilateral lung nodules not significantly changed.  No new nodules.   Unchanged enlarged pretracheal nodes.  Increase in fibrotic consolidation of the perihilar and inferior right lung. ?Cycle 9 FOLFIRI 04/12/2021 ?05/04/2021 treatment held due to fatigue ? ?  ?2.   Hypothyroid ?  ?3.    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  ?4.  Port-A-Cath placement 09/29/2019, interventional radiology ?  ?5.  Neutropenia and thrombocytopenia following cycle 1 FOLFOX-plan chemotherapy dose reductions, she declined G-CSF ?  ?6.  Mucositis following cycle 5 FOLFOX, 5-fluorouracil dose reduced with cycle 6 ?Mucositis following cycle 22 5-fluorouracil-Magic mouthwash added ?  ?7.  Right lung airspace disease/volume loss-likely toxicity from chest radiation, trial of prednisone 01/29/2020; dyspnea and cough improved 02/11/2020 ?Progressive cough 03/11/2020-prednisone resumed at a dose of 20 mg daily ?Cough and dyspnea improved 03/25/2020-prednisone taper to 15 mg daily ?Improved 04/06/2020-prednisone taper to 10 mg daily ?Stable 04/21/2020-prednisone taper to 5 mg daily ?  ? ?Disposition: Samantha Lutz appears stable.  She has completed 9 cycles of FOLFIRI.  Overall she has been tolerating treatment fairly well.  She presents today with progressive fatigue.  She feels she needs a break from treatment.  We decided to hold chemotherapy today.  She will return for lab, follow-up, possible FOLFIRI in 2 weeks.  She will contact the office in the interim with any problems. ? ? ? ?Ned Card ANP/GNP-BC  ? ?05/04/2021  ?9:28 AM ? ? ? ? ? ? ? ?

## 2021-05-04 NOTE — Progress Notes (Signed)
Patient seen by Ned Card NP today ? ?Vitals are within treatment parameters. ? ?Labs reviewed by Ned Card NP and are not all within treatment parameters. Platelet count 96,000 ? ?Per physician team, patient will not be receiving treatment today.  ?Patient requests treatment break this cycle due to fatigue. ?

## 2021-05-04 NOTE — Patient Instructions (Signed)

## 2021-05-06 ENCOUNTER — Inpatient Hospital Stay: Payer: Medicare Other

## 2021-05-18 ENCOUNTER — Inpatient Hospital Stay: Payer: Medicare Other

## 2021-05-18 ENCOUNTER — Encounter: Payer: Self-pay | Admitting: Nurse Practitioner

## 2021-05-18 ENCOUNTER — Encounter: Payer: Self-pay | Admitting: *Deleted

## 2021-05-18 ENCOUNTER — Inpatient Hospital Stay: Payer: Medicare Other | Admitting: Nurse Practitioner

## 2021-05-18 VITALS — BP 145/79 | HR 86 | Temp 98.2°F | Resp 20 | Ht 63.0 in | Wt 212.6 lb

## 2021-05-18 DIAGNOSIS — Z5111 Encounter for antineoplastic chemotherapy: Secondary | ICD-10-CM | POA: Diagnosis not present

## 2021-05-18 DIAGNOSIS — C20 Malignant neoplasm of rectum: Secondary | ICD-10-CM

## 2021-05-18 LAB — CMP (CANCER CENTER ONLY)
ALT: 28 U/L (ref 0–44)
AST: 41 U/L (ref 15–41)
Albumin: 3.3 g/dL — ABNORMAL LOW (ref 3.5–5.0)
Alkaline Phosphatase: 67 U/L (ref 38–126)
Anion gap: 10 (ref 5–15)
BUN: 14 mg/dL (ref 8–23)
CO2: 21 mmol/L — ABNORMAL LOW (ref 22–32)
Calcium: 8.1 mg/dL — ABNORMAL LOW (ref 8.9–10.3)
Chloride: 112 mmol/L — ABNORMAL HIGH (ref 98–111)
Creatinine: 0.77 mg/dL (ref 0.44–1.00)
GFR, Estimated: >60 mL/min (ref >60)
Glucose, Bld: 89 mg/dL (ref 70–99)
Potassium: 3.6 mmol/L (ref 3.5–5.1)
Sodium: 143 mmol/L (ref 135–145)
Total Bilirubin: 0.8 mg/dL (ref 0.3–1.2)
Total Protein: 5.7 g/dL — ABNORMAL LOW (ref 6.5–8.1)

## 2021-05-18 LAB — CBC WITH DIFFERENTIAL (CANCER CENTER ONLY)
Abs Immature Granulocytes: 0.02 10*3/uL (ref 0.00–0.07)
Basophils Absolute: 0 10*3/uL (ref 0.0–0.1)
Basophils Relative: 1 %
Eosinophils Absolute: 0.1 10*3/uL (ref 0.0–0.5)
Eosinophils Relative: 3 %
HCT: 34.9 % — ABNORMAL LOW (ref 36.0–46.0)
Hemoglobin: 11.5 g/dL — ABNORMAL LOW (ref 12.0–15.0)
Immature Granulocytes: 0 %
Lymphocytes Relative: 19 %
Lymphs Abs: 0.9 10*3/uL (ref 0.7–4.0)
MCH: 33.2 pg (ref 26.0–34.0)
MCHC: 33 g/dL (ref 30.0–36.0)
MCV: 100.9 fL — ABNORMAL HIGH (ref 80.0–100.0)
Monocytes Absolute: 0.7 10*3/uL (ref 0.1–1.0)
Monocytes Relative: 16 %
Neutro Abs: 2.9 10*3/uL (ref 1.7–7.7)
Neutrophils Relative %: 61 %
Platelet Count: 107 10*3/uL — ABNORMAL LOW (ref 150–400)
RBC: 3.46 MIL/uL — ABNORMAL LOW (ref 3.87–5.11)
RDW: 14.6 % (ref 11.5–15.5)
WBC Count: 4.7 10*3/uL (ref 4.0–10.5)
nRBC: 0 % (ref 0.0–0.2)

## 2021-05-18 MED ORDER — SODIUM CHLORIDE 0.9 % IV SOLN
300.0000 mg/m2 | Freq: Once | INTRAVENOUS | Status: AC
  Administered 2021-05-18: 618 mg via INTRAVENOUS
  Filled 2021-05-18: qty 30.9

## 2021-05-18 MED ORDER — DEXAMETHASONE SODIUM PHOSPHATE 10 MG/ML IJ SOLN
5.0000 mg | Freq: Once | INTRAMUSCULAR | Status: AC
  Administered 2021-05-18: 5 mg via INTRAVENOUS

## 2021-05-18 MED ORDER — ATROPINE SULFATE 1 MG/ML IV SOLN
0.5000 mg | Freq: Once | INTRAVENOUS | Status: AC | PRN
  Administered 2021-05-18: 0.5 mg via INTRAVENOUS

## 2021-05-18 MED ORDER — SODIUM CHLORIDE 0.9 % IV SOLN: Freq: Once | INTRAVENOUS | Status: AC

## 2021-05-18 MED ORDER — PALONOSETRON HCL INJECTION 0.25 MG/5ML
0.2500 mg | Freq: Once | INTRAVENOUS | Status: AC
  Administered 2021-05-18: 0.25 mg via INTRAVENOUS

## 2021-05-18 MED ORDER — SODIUM CHLORIDE 0.9 % IV SOLN
1800.0000 mg/m2 | INTRAVENOUS | Status: DC
  Administered 2021-05-18: 3700 mg via INTRAVENOUS
  Filled 2021-05-18: qty 74

## 2021-05-18 MED ORDER — SODIUM CHLORIDE 0.9 % IV SOLN
135.0000 mg/m2 | Freq: Once | INTRAVENOUS | Status: AC
  Administered 2021-05-18: 280 mg via INTRAVENOUS
  Filled 2021-05-18: qty 4

## 2021-05-18 MED ORDER — FLUOROURACIL CHEMO INJECTION 2.5 GM/50ML
300.0000 mg/m2 | Freq: Once | INTRAVENOUS | Status: AC
  Administered 2021-05-18: 600 mg via INTRAVENOUS
  Filled 2021-05-18: qty 12

## 2021-05-18 NOTE — Progress Notes (Signed)
Patient presents for treatment. RN assessment completed along with the following: ? ?Labs/vitals reviewed - Yes, and within treatment parameters.   ?Weight within 10% of previous measurement - Yes ?Informed consent completed and reflects current therapy/intent - Yes, on date 10/12/2020             ?Provider progress note reviewed - Yes, today's provider note was reviewed. ?Treatment/Antibody/Supportive plan reviewed - Yes, and there are no adjustments needed for today's treatment. ?S&H and other orders reviewed - Yes, and there are no additional orders identified. ?Previous treatment date reviewed - Yes, and the appropriate amount of time has elapsed between treatments. ?Clinic Hand Off Received from - Yes from [place] , RN ? ?Patient to proceed with treatment.  ? ?

## 2021-05-18 NOTE — Progress Notes (Signed)
Patient seen by Lisa Thomas NP today  Vitals are within treatment parameters.  Labs reviewed by Lisa Thomas NP and are within treatment parameters.  Per physician team, patient is ready for treatment and there are NO modifications to the treatment plan.     

## 2021-05-18 NOTE — Patient Instructions (Addendum)
[facility]   ?Discharge Instructions: ?Thank you for choosing [facility] to provide your oncology and hematology care.  ? ?If you have a lab appointment with the [facility], please go directly to the [facility] and check in at the registration area. ?  ?Wear comfortable clothing and clothing appropriate for easy access to any Portacath or PICC line.  ? ?We strive to give you quality time with your provider. You may need to reschedule your appointment if you arrive late (15 or more minutes).  Arriving late affects you and other patients whose appointments are after yours.  Also, if you miss three or more appointments without notifying the office, you may be dismissed from the clinic at the provider?s discretion.    ?  ?For prescription refill requests, have your pharmacy contact our office and allow 72 hours for refills to be completed.   ? ?Today you received the following chemotherapy and/or immunotherapy agents Irinotecan (CAMPTOSAR), Leucovorin & Flourouracil (ADRUCIL).    ?  ?To help prevent nausea and vomiting after your treatment, we encourage you to take your nausea medication as directed. ? ?BELOW ARE SYMPTOMS THAT SHOULD BE REPORTED IMMEDIATELY: ?*FEVER GREATER THAN 100.4 F (38 ?C) OR HIGHER ?*CHILLS OR SWEATING ?*NAUSEA AND VOMITING THAT IS NOT CONTROLLED WITH YOUR NAUSEA MEDICATION ?*UNUSUAL SHORTNESS OF BREATH ?*UNUSUAL BRUISING OR BLEEDING ?*URINARY PROBLEMS (pain or burning when urinating, or frequent urination) ?*BOWEL PROBLEMS (unusual diarrhea, constipation, pain near the anus) ?TENDERNESS IN MOUTH AND THROAT WITH OR WITHOUT PRESENCE OF ULCERS (sore throat, sores in mouth, or a toothache) ?UNUSUAL RASH, SWELLING OR PAIN  ?UNUSUAL VAGINAL DISCHARGE OR ITCHING  ? ?Items with * indicate a potential emergency and should be followed up as soon as possible or go to the Emergency Department if any problems should occur. ? ?Please show the CHEMOTHERAPY ALERT  CARD or IMMUNOTHERAPY ALERT CARD at check-in to the Emergency Department and triage nurse. ? ?Should you have questions after your visit or need to cancel or reschedule your appointment, please contact [facility]  Dept: [phone]  and follow the prompts.  Office hours are 8:00 a.m. to 4:30 p.m. Monday - Friday. Please note that voicemails left after 4:00 p.m. may not be returned until the following business day.  We are closed weekends and major holidays. You have access to a nurse at all times for urgent questions. Please call the main number to the clinic Dept: [phone] and follow the prompts. ? ? ?For any non-urgent questions, you may also contact your provider using MyChart. We now offer e-Visits for anyone 27 and older to request care online for non-urgent symptoms. For details visit [email or web address]. ?  ?Also download the MyChart app! Go to the app store, search "MyChart", open the app, select [facility], and log in with your MyChart username and password. ? ?Due to Covid, a mask is required upon entering the hospital/clinic. If you do not have a mask, one will be given to you upon arrival. For doctor visits, patients may have 1 support person aged 36 or older with them. For treatment visits, patients cannot have anyone with them due to current Covid guidelines and our immunocompromised population.  ? ?Irinotecan injection ?What is this medication? ?IRINOTECAN (ir in oh TEE kan ) is a chemotherapy drug. It is used to treat colon and rectal cancer. ?This medicine may be used for other purposes; ask your health care provider or pharmacist if you  have questions. ?COMMON BRAND NAME(S): Camptosar ?What should I tell my care team before I take this medication? ?They need to know if you have any of these conditions: ?dehydration ?diarrhea ?infection (especially a virus infection such as chickenpox, cold sores, or herpes) ?liver disease ?low blood counts, like low white cell,  platelet, or red cell counts ?low levels of calcium, magnesium, or potassium in the blood ?recent or ongoing radiation therapy ?an unusual or allergic reaction to irinotecan, other medicines, foods, dyes, or preservatives ?pregnant or trying to get pregnant ?breast-feeding ?How should I use this medication? ?This drug is given as an infusion into a vein. It is administered in a hospital or clinic by a specially trained health care professional. ?Talk to your pediatrician regarding the use of this medicine in children. Special care may be needed. ?Overdosage: If you think you have taken too much of this medicine contact a poison control center or emergency room at once. ?NOTE: This medicine is only for you. Do not share this medicine with others. ?What if I miss a dose? ?It is important not to miss your dose. Call your doctor or health care professional if you are unable to keep an appointment. ?What may interact with this medication? ?Do not take this medicine with any of the following medications: ?cobicistat ?itraconazole ?This medicine may interact with the following medications: ?antiviral medicines for HIV or AIDS ?certain antibiotics like rifampin or rifabutin ?certain medicines for fungal infections like ketoconazole, posaconazole, and voriconazole ?certain medicines for seizures like carbamazepine, phenobarbital, phenotoin ?clarithromycin ?gemfibrozil ?nefazodone ?Ali Chukson ?This list may not describe all possible interactions. Give your health care provider a list of all the medicines, herbs, non-prescription drugs, or dietary supplements you use. Also tell them if you smoke, drink alcohol, or use illegal drugs. Some items may interact with your medicine. ?What should I watch for while using this medication? ?Your condition will be monitored carefully while you are receiving this medicine. You will need important blood work done while you are taking this medicine. ?This drug may make you feel  generally unwell. This is not uncommon, as chemotherapy can affect healthy cells as well as cancer cells. Report any side effects. Continue your course of treatment even though you feel ill unless your doctor tells you to stop. ?In some cases, you may be given additional medicines to help with side effects. Follow all directions for their use. ?You may get drowsy or dizzy. Do not drive, use machinery, or do anything that needs mental alertness until you know how this medicine affects you. Do not stand or sit up quickly, especially if you are an older patient. This reduces the risk of dizzy or fainting spells. ?Call your health care professional for advice if you get a fever, chills, or sore throat, or other symptoms of a cold or flu. Do not treat yourself. This medicine decreases your body's ability to fight infections. Try to avoid being around people who are sick. ?Avoid taking products that contain aspirin, acetaminophen, ibuprofen, naproxen, or ketoprofen unless instructed by your doctor. These medicines may hide a fever. ?This medicine may increase your risk to bruise or bleed. Call your doctor or health care professional if you notice any unusual bleeding. ?Be careful brushing and flossing your teeth or using a toothpick because you may get an infection or bleed more easily. If you have any dental work done, tell your dentist you are receiving this medicine. ?Do not become pregnant while taking this medicine or for  6 months after stopping it. Women should inform their health care professional if they wish to become pregnant or think they might be pregnant. Men should not father a child while taking this medicine and for 3 months after stopping it. There is potential for serious side effects to an unborn child. Talk to your health care professional for more information. ?Do not breast-feed an infant while taking this medicine or for 7 days after stopping it. ?This medicine has caused ovarian failure in some  women. This medicine may make it more difficult to get pregnant. Talk to your health care professional if you are concerned about your fertility. ?This medicine has caused decreased sperm counts in some men. T

## 2021-05-18 NOTE — Progress Notes (Signed)
?[facility] ?OFFICE PROGRESS NOTE ? ? ?Diagnosis: Rectal cancer ? ?INTERVAL HISTORY:  ? ?Samantha Lutz returns as scheduled.  Chemotherapy was held for 06/18/2021 due to fatigue.  She is seen today for reevaluation.  She is feeling much better.  Energy level has returned to baseline.  No nausea or vomiting.  No diarrhea.  She has noted a rash on her face for the past several months and also notes skin in general is very dry. ? ?Objective: ? ?Vital signs in last 24 hours: ? ?Blood pressure (!) 145/79, pulse 86, temperature 98.2 ?F (36.8 ?C), temperature source Oral, resp. rate 20, height '5\' 3"'  (1.6 m), weight 212 lb 9.6 oz (96.4 kg), SpO2 96 %. ?  ? ?HEENT: No thrush or ulcers.   ?Resp: Lungs clear bilaterally. ?Cardio: Regular rate and rhythm. ?GI: Abdomen soft and nontender.  No hepatomegaly. ?Vascular: No leg edema. ?Skin: Malar region with faint erythematous macular appearing rash.  ?Port-A-Cath without erythema. ? ?Lab Results: ? ?Lab Results  ?Component Value Date  ? WBC 4.7 05/18/2021  ? HGB 11.5 (L) 05/18/2021  ? HCT 34.9 (L) 05/18/2021  ? MCV 100.9 (H) 05/18/2021  ? PLT 107 (L) 05/18/2021  ? NEUTROABS 2.9 05/18/2021  ? ? ?Imaging: ? ?No results found. ? ?Medications: I have reviewed the patient's current medications. ? ?Assessment/Plan: ?Rectal cancer ?Mass at 7 cm from the anal verge on colonoscopy 08/09/2017, biopsy revealed invasive adenocarcinoma ?Staging CTs 08/17/2017-no evidence of metastatic disease, asymmetric thickening in the mid rectum ?MR pelvis 09/01/2017, T3N0 lesion beginning at 6.3 cm from the anal sphincter ?Radiation/Xeloda initiated 09/17/2017, completed 10/25/2017 ?Low anterior resection/diverting ileostomy 12/21/2017,ypT3,ypN1a tumor.  Lymphovascular invasion present, intact mismatch repair protein expression, treatment effect present (TRS 1).  Mismatch repair protein IHC normal; Foundation 1-KRAS/NRAS wild-type, microsatellite status and tumor mutational burden could not be  determined. ?Cycle 1 adjuvant Xeloda beginning 01/21/2018 ?Cycle 2 adjuvant Xeloda beginning 02/11/2018 ?Xeloda discontinued after cycle 2 secondary to patient preference ?Ileostomy takedown 07/10/2018 ?CTs 09/07/2018- multiple live small pulmonary nodules concerning for metastatic disease ?CT chest 12/10/2018-multiple bilateral lung nodules, some have increased in size ?CT chest 04/08/2019-mild enlargement of bilateral lung nodules ?Status post SBRT multiple lung nodules 05/06/2019, 05/08/2019, 05/13/2019 ?CT chest 08/28/2019-improvement and resolution in majority of right-sided pulmonary nodules, a superior segment right lower lobe nodule has increased, no new right-sided nodules.  Progressive enlargement of left-sided pulmonary nodules ?PET scan 09/09/2019-hypermetabolic right lower lobe nodule, 2 hypermetabolic left upper lobe nodules with an additional 0.7 cm left upper lobe nodule below PET resolution, groundglass opacity in the mid to right lower lobe with associated hypermetabolism consistent with postradiation change, hypermetabolic central segment 4A liver lesion ?Cycle 1 FOLFOX 10/14/2019 ?Cycle  2FOLFOX 11/11/2019, 5-FU and oxaliplatin dose reduced secondary to neutropenia and thrombocytopenia following cycle one, G-CSF declined ?Cycle 3 FOLFOX 11/26/2019 ?Cycle 4 FOLFOX 12/11/2019, oxaliplatin held due to neutropenia and thrombocytopenia ?Cycle 5 FOLFOX 12/31/2019 ?Cycle 6 FOLFOX 01/14/2020 (oxaliplatin held, 5-fluorouracil dose reduced due to mucositis) ?CT chest 01/27/2020-improvement in left lung nodules, progressive airspace disease with traction bronchiectasis throughout the right lung, previously noted right lung nodules are obscured, mildly enlarged right paratracheal lymph nodes-not hypermetabolic on prior PET, likely reactive ?Cycle 7 FOLFOX 01/28/2020 ?Cycle 8 FOLFOX 02/11/2020 (oxaliplatin held due to neutropenia and thrombocytopenia) ?Cycle 9 FOLFOX 02/25/2020 (oxaliplatin held due to  thrombocytopenia) ?Cycle 10 FOLFOX 03/11/2020 (oxaliplatin held secondary to neuropathy) ?Cycle 11 FOLFOX 03/25/2020 (oxaliplatin held due to neuropathy) ?CT chest 04/05/2020-no new or progressive findings.  Interval evolution of presumed postradiation scarring in the right perihilar lung with decrease in the more diffuse groundglass opacity seen previously.  No substantial change in left lung nodules. 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  No definite CT findings to suggest recurrent tumor.  Stable small left upper lobe pulmonary nodules.  No new or progressive findings.  Stable small right paratracheal lymph nodes.  Stable area of irregular low-attenuation in hepatic segment 4A, site of known prior hepatic metastatic lesion.  No new or progressive findings. ?Cycle 18 5-fluorouracil 07/06/2020 ?Cycle 19 5-fluorouracil 07/20/2020 ?Cycle 20 5-fluorouracil 08/03/2020 ?Cycle 21 5-fluorouracil 08/17/2020  ?Cycle 22 5-fluorouracil 08/31/2020 ?Cycle 23 5-fluorouracil 09/14/2020 ?CT chest 09/24/2020-increased number and size of pulmonary nodules bilaterally.  Findings in the right upper lobe are concerning for potential lymphangitic spread of tumor.  Right upper lobe findings could also reflect evolving postradiation change. ?Cycle 1 FOLFIRI 10/12/2020 ?Cycle 2 FOLFIRI 11/08/2020, irinotecan dose reduced due to neutropenia and thrombocytopenia following cycle 1 ?Cycle 3 FOLFIRI 12/01/2020 ?Cycle 4 FOLFIRI 12/22/2020 ?CT chest 01/11/2021-similar bilateral pulmonary nodules.  Left upper lobe nodule has mildly decreased in size.  No new nodules. ?Cycle 5 FOLFIRI 01/12/2021 ?Cycle 6 FOLFIRI 02/03/2021 ?Cycle 7 FOLFIRI 03/01/2021 ?Cycle 8  FOLFIRI 03/22/2021 ?CT chest 04/08/2021-multiple small bilateral lung nodules not significantly changed.  No new nodules.  Unchanged enlarged pretracheal nodes.  Increase in fibrotic consolidation of the perihilar and inferior right lung. ?Cycle 9 FOLFIRI 04/12/2021 ?05/04/2021 treatment held due to fatigue ?Cycle 10 FOLFIRI 05/18/2021 ?  ?  ?2.   Hypothyroid ?  ?3.    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  ?4.  Port-A-Cath placement 09/29/2019, interventional radiology ?  ?5.  Neutropenia and thrombocytopenia following cycle 1 FOLFOX-plan chemotherapy dose reductions, she declined G-CSF ?  ?6.  Mucositis following cycle 5 FOLFOX, 5-fluorouracil dose reduced with cycle 6 ?Mucositis following cycle 22 5-fluorouracil-Magic mouthwash added ?  ?7.  Right lung airspace disease/volume loss-likely toxicity from chest radiation, trial of prednisone 01/29/2020; dyspnea and cough improved 02/11/2020 ?Progressive cough 03/11/2020-prednisone resumed at a dose of 20 mg daily ?Cough and dyspnea improved 03/25/2020-prednisone taper to 15 mg daily ?Improved 04/06/2020-prednisone taper to 10 mg daily ?Stable 04/21/2020-prednisone taper to 5 mg daily ?  ? ?Disposition: Samantha Lutz appears stable.  She has completed 9 cycles of FOLFIRI.  Treatment was held 2 weeks ago due to fatigue.  She is feeling much better today.  She would like to resume treatment.  Plan to proceed with cycle 10 today as scheduled. ? ?CBC from today reviewed.  Counts adequate to proceed as above. ? ?The rash on her face may be related to steroids, chemotherapy, mask wearing.  She will try a moisturizer. ? ?She will return for lab, follow-up, FOLFIRI in 3 weeks.  She will contact the office in the interim with any problems. ? ? ? ?Ned Card ANP/GNP-BC  ? ?05/18/2021  ?11:00 AM ? ? ? ? ? ? ? ?

## 2021-05-20 ENCOUNTER — Inpatient Hospital Stay: Payer: Medicare Other

## 2021-05-20 VITALS — BP 150/86 | HR 86 | Temp 98.1°F

## 2021-05-20 DIAGNOSIS — Z5111 Encounter for antineoplastic chemotherapy: Secondary | ICD-10-CM | POA: Diagnosis not present

## 2021-05-20 DIAGNOSIS — Z95828 Presence of other vascular implants and grafts: Secondary | ICD-10-CM

## 2021-05-20 MED ORDER — HEPARIN SOD (PORK) LOCK FLUSH 100 UNIT/ML IV SOLN: 500.0000 [IU] | Freq: Once | INTRAVENOUS | Status: DC

## 2021-05-20 MED ORDER — HEPARIN SOD (PORK) LOCK FLUSH 100 UNIT/ML IV SOLN
500.0000 [IU] | Freq: Once | INTRAVENOUS | Status: AC
  Administered 2021-05-20: 500 [IU]

## 2021-05-20 MED ORDER — SODIUM CHLORIDE 0.9% FLUSH
10.0000 mL | Freq: Once | INTRAVENOUS | Status: AC
  Administered 2021-05-20: 10 mL

## 2021-06-01 ENCOUNTER — Other Ambulatory Visit: Payer: Self-pay | Admitting: Nurse Practitioner

## 2021-06-01 DIAGNOSIS — E039 Hypothyroidism, unspecified: Secondary | ICD-10-CM

## 2021-06-04 ENCOUNTER — Other Ambulatory Visit: Payer: Self-pay | Admitting: Oncology

## 2021-06-07 ENCOUNTER — Inpatient Hospital Stay: Payer: Medicare Other | Admitting: Oncology

## 2021-06-07 ENCOUNTER — Encounter: Payer: Self-pay | Admitting: *Deleted

## 2021-06-07 ENCOUNTER — Inpatient Hospital Stay: Payer: Medicare Other | Attending: Oncology

## 2021-06-07 ENCOUNTER — Inpatient Hospital Stay: Payer: Medicare Other

## 2021-06-07 ENCOUNTER — Encounter: Payer: Self-pay | Admitting: Oncology

## 2021-06-07 ENCOUNTER — Other Ambulatory Visit: Payer: Self-pay

## 2021-06-07 VITALS — BP 150/82 | HR 91 | Temp 98.2°F | Resp 18 | Ht 63.0 in | Wt 216.6 lb

## 2021-06-07 DIAGNOSIS — C20 Malignant neoplasm of rectum: Secondary | ICD-10-CM

## 2021-06-07 DIAGNOSIS — D6959 Other secondary thrombocytopenia: Secondary | ICD-10-CM | POA: Insufficient documentation

## 2021-06-07 DIAGNOSIS — D709 Neutropenia, unspecified: Secondary | ICD-10-CM | POA: Diagnosis not present

## 2021-06-07 DIAGNOSIS — E039 Hypothyroidism, unspecified: Secondary | ICD-10-CM | POA: Diagnosis not present

## 2021-06-07 DIAGNOSIS — K1230 Oral mucositis (ulcerative), unspecified: Secondary | ICD-10-CM | POA: Insufficient documentation

## 2021-06-07 DIAGNOSIS — Z5111 Encounter for antineoplastic chemotherapy: Secondary | ICD-10-CM | POA: Diagnosis present

## 2021-06-07 DIAGNOSIS — Z95828 Presence of other vascular implants and grafts: Secondary | ICD-10-CM

## 2021-06-07 LAB — CBC WITH DIFFERENTIAL (CANCER CENTER ONLY)
Abs Immature Granulocytes: 0.04 10*3/uL (ref 0.00–0.07)
Basophils Absolute: 0 10*3/uL (ref 0.0–0.1)
Basophils Relative: 0 %
Eosinophils Absolute: 0.1 10*3/uL (ref 0.0–0.5)
Eosinophils Relative: 4 %
HCT: 29.7 % — ABNORMAL LOW (ref 36.0–46.0)
Hemoglobin: 10.3 g/dL — ABNORMAL LOW (ref 12.0–15.0)
Immature Granulocytes: 2 %
Lymphocytes Relative: 25 %
Lymphs Abs: 0.6 10*3/uL — ABNORMAL LOW (ref 0.7–4.0)
MCH: 34.1 pg — ABNORMAL HIGH (ref 26.0–34.0)
MCHC: 34.7 g/dL (ref 30.0–36.0)
MCV: 98.3 fL (ref 80.0–100.0)
Monocytes Absolute: 0.5 10*3/uL (ref 0.1–1.0)
Monocytes Relative: 22 %
Neutro Abs: 1.1 10*3/uL — ABNORMAL LOW (ref 1.7–7.7)
Neutrophils Relative %: 47 %
Platelet Count: 88 10*3/uL — ABNORMAL LOW (ref 150–400)
RBC: 3.02 MIL/uL — ABNORMAL LOW (ref 3.87–5.11)
RDW: 15.4 % (ref 11.5–15.5)
WBC Count: 2.3 10*3/uL — ABNORMAL LOW (ref 4.0–10.5)
nRBC: 0 % (ref 0.0–0.2)

## 2021-06-07 LAB — CMP (CANCER CENTER ONLY)
ALT: 16 U/L (ref 0–44)
AST: 20 U/L (ref 15–41)
Albumin: 3.3 g/dL — ABNORMAL LOW (ref 3.5–5.0)
Alkaline Phosphatase: 58 U/L (ref 38–126)
Anion gap: 10 (ref 5–15)
BUN: 15 mg/dL (ref 8–23)
CO2: 25 mmol/L (ref 22–32)
Calcium: 8.6 mg/dL — ABNORMAL LOW (ref 8.9–10.3)
Chloride: 108 mmol/L (ref 98–111)
Creatinine: 0.78 mg/dL (ref 0.44–1.00)
GFR, Estimated: >60 mL/min (ref >60)
Glucose, Bld: 90 mg/dL (ref 70–99)
Potassium: 3.5 mmol/L (ref 3.5–5.1)
Sodium: 143 mmol/L (ref 135–145)
Total Bilirubin: 0.7 mg/dL (ref 0.3–1.2)
Total Protein: 5.7 g/dL — ABNORMAL LOW (ref 6.5–8.1)

## 2021-06-07 MED ORDER — HEPARIN SOD (PORK) LOCK FLUSH 100 UNIT/ML IV SOLN
500.0000 [IU] | Freq: Once | INTRAVENOUS | Status: AC
  Administered 2021-06-07: 500 [IU] via INTRAVENOUS

## 2021-06-07 MED ORDER — SODIUM CHLORIDE 0.9% FLUSH
10.0000 mL | Freq: Once | INTRAVENOUS | Status: AC
  Administered 2021-06-07: 10 mL via INTRAVENOUS

## 2021-06-07 MED ORDER — SODIUM CHLORIDE 0.9% FLUSH: 10.0000 mL | INTRAVENOUS | Status: DC | PRN

## 2021-06-07 MED ORDER — PREDNISONE 5 MG PO TABS: 5.0000 mg | ORAL_TABLET | Freq: Every day | ORAL | 2 refills | Status: DC

## 2021-06-07 NOTE — Progress Notes (Signed)
Patient seen by Dr. Benay Spice today ? ?Vitals are within treatment parameters. ? ?Labs reviewed by Dr. Benay Spice and are not all within treatment parameters. ANC 1.1 and platelets are 88,000 ? ?Per physician team, patient will not be receiving treatment today.  ?

## 2021-06-07 NOTE — Patient Instructions (Signed)

## 2021-06-07 NOTE — Progress Notes (Signed)
?[facility] ?OFFICE PROGRESS NOTE ? ? ?Diagnosis: Rectal cancer ? ?INTERVAL HISTORY:  ? ?Ms. Peloso completed another cycle of FOLFIRI on 05/18/2021.  She had malaise lasting for several days following chemotherapy.  After she left the cancer center on 05/18/2021 she had a "jittery "feeling and a fullness in her throat and chest.  The symptoms were improved by the next day, but were present intermittently for several days.  No dyspnea or difficulty swallowing.  She felt like her throat was swollen. ? ?Objective: ? ?Vital signs in last 24 hours: ? ?Blood pressure (!) 150/82, pulse 91, temperature 98.2 ?F (36.8 ?C), temperature source Oral, resp. rate 18, height _0  (1.6 m), weight 216 lb 9.6 oz (98.2 kg), SpO2 96 %. ?  ? ?HEENT: No thrush or ulcers ?Resp: Decreased breath sounds at the right lower chest ?Cardio: Regular rate and rhythm ?GI: No hepatosplenomegaly ?Vascular: No leg edema   ? ?Portacath/PICC-without erythema ? ?Lab Results: ? ?Lab Results  ?Component Value Date  ? WBC 2.3 (L) 06/07/2021  ? HGB 10.3 (L) 06/07/2021  ? HCT 29.7 (L) 06/07/2021  ? MCV 98.3 06/07/2021  ? PLT 88 (L) 06/07/2021  ? NEUTROABS 1.1 (L) 06/07/2021  ? ? ?CMP  ?Lab Results  ?Component Value Date  ? NA 143 06/07/2021  ? K 3.5 06/07/2021  ? CL 108 06/07/2021  ? CO2 25 06/07/2021  ? GLUCOSE 90 06/07/2021  ? BUN 15 06/07/2021  ? CREATININE 0.78 06/07/2021  ? CALCIUM 8.6 (L) 06/07/2021  ? PROT 5.7 (L) 06/07/2021  ? ALBUMIN 3.3 (L) 06/07/2021  ? AST 20 06/07/2021  ? ALT 16 06/07/2021  ? ALKPHOS 58 06/07/2021  ? BILITOT 0.7 06/07/2021  ? GFRNONAA >60 06/07/2021  ? GFRAA >60 10/28/2019  ? ? ?Lab Results  ?Component Value Date  ? CEA1 <1.00 06/01/2020  ? CEA <1.00 06/01/2020  ? ? ?Medications: I have reviewed the patient's current medications. ? ? ?Assessment/Plan: ?Rectal cancer ?Mass at 7 cm from the anal verge on colonoscopy 08/09/2017, biopsy revealed invasive adenocarcinoma ?Staging CTs 08/17/2017-no evidence of  metastatic disease, asymmetric thickening in the mid rectum ?MR pelvis 09/01/2017, T3N0 lesion beginning at 6.3 cm from the anal sphincter ?Radiation/Xeloda initiated 09/17/2017, completed 10/25/2017 ?Low anterior resection/diverting ileostomy 12/21/2017,ypT3,ypN1a tumor.  Lymphovascular invasion present, intact mismatch repair protein expression, treatment effect present (TRS 1).  Mismatch repair protein IHC normal; Foundation 1-KRAS/NRAS wild-type, microsatellite status and tumor mutational burden could not be determined. ?Cycle 1 adjuvant Xeloda beginning 01/21/2018 ?Cycle 2 adjuvant Xeloda beginning 02/11/2018 ?Xeloda discontinued after cycle 2 secondary to patient preference ?Ileostomy takedown 07/10/2018 ?CTs 09/07/2018- multiple live small pulmonary nodules concerning for metastatic disease ?CT chest 12/10/2018-multiple bilateral lung nodules, some have increased in size ?CT chest 04/08/2019-mild enlargement of bilateral lung nodules ?Status post SBRT multiple lung nodules 05/06/2019, 05/08/2019, 05/13/2019 ?CT chest 08/28/2019-improvement and resolution in majority of right-sided pulmonary nodules, a superior segment right lower lobe nodule has increased, no new right-sided nodules.  Progressive enlargement of left-sided pulmonary nodules ?PET scan 09/09/2019-hypermetabolic right lower lobe nodule, 2 hypermetabolic left upper lobe nodules with an additional 0.7 cm left upper lobe nodule below PET resolution, groundglass opacity in the mid to right lower lobe with associated hypermetabolism consistent with postradiation change, hypermetabolic central segment 4A liver lesion ?Cycle 1 FOLFOX 10/14/2019 ?Cycle  2FOLFOX 11/11/2019, 5-FU and oxaliplatin dose reduced secondary to neutropenia and thrombocytopenia following cycle one, G-CSF declined ?Cycle 3 FOLFOX 11/26/2019 ?Cycle 4 FOLFOX 12/11/2019, oxaliplatin held due to  neutropenia and thrombocytopenia ?Cycle 5 FOLFOX 12/31/2019 ?Cycle 6 FOLFOX 01/14/2020 (oxaliplatin held,  5-fluorouracil dose reduced due to mucositis) ?CT chest 01/27/2020-improvement in left lung nodules, progressive airspace disease with traction bronchiectasis throughout the right lung, previously noted right lung nodules are obscured, mildly enlarged right paratracheal lymph nodes-not hypermetabolic on prior PET, likely reactive ?Cycle 7 FOLFOX 01/28/2020 ?Cycle 8 FOLFOX 02/11/2020 (oxaliplatin held due to neutropenia and thrombocytopenia) ?Cycle 9 FOLFOX 02/25/2020 (oxaliplatin held due to thrombocytopenia) ?Cycle 10 FOLFOX 03/11/2020 (oxaliplatin held secondary to neuropathy) ?Cycle 11 FOLFOX 03/25/2020 (oxaliplatin held due to neuropathy) ?CT chest 04/05/2020-no new or progressive findings.  Interval evolution of presumed postradiation scarring in the right perihilar lung with decrease in the more diffuse groundglass opacity seen previously.  No substantial change in left lung nodules. 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  No definite CT findings to suggest recurrent tumor.  Stable small left upper lobe pulmonary nodules.  No new or progressive findings.  Stable small right paratracheal lymph nodes.  Stable area of irregular low-attenuation in hepatic segment 4A, site of known prior hepatic metastatic lesion.  No new or progressive findings. ?Cycle 18 5-fluorouracil 07/06/2020 ?Cycle 19 5-fluorouracil 07/20/2020 ?Cycle 20 5-fluorouracil 08/03/2020 ?Cycle 21 5-fluorouracil 08/17/2020  ?Cycle 22 5-fluorouracil 08/31/2020 ?Cycle 23 5-fluorouracil 09/14/2020 ?CT chest 09/24/2020-increased number and size of pulmonary nodules bilaterally.  Findings in the right upper lobe are concerning for potential lymphangitic spread of tumor.   Right upper lobe findings could also reflect evolving postradiation change. ?Cycle 1 FOLFIRI 10/12/2020 ?Cycle 2 FOLFIRI 11/08/2020, irinotecan dose reduced due to neutropenia and thrombocytopenia following cycle 1 ?Cycle 3 FOLFIRI 12/01/2020 ?Cycle 4 FOLFIRI 12/22/2020 ?CT chest 01/11/2021-similar bilateral pulmonary nodules.  Left upper lobe nodule has mildly decreased in size.  No new nodules. ?Cycle 5 FOLFIRI 01/12/2021 ?Cycle 6 FOLFIRI 02/03/2021 ?Cycle 7 FOLFIRI 03/01/2021 ?Cycle 8 FOLFIRI 03/22/2021 ?CT chest 04/08/2021-multiple small bilateral lung nodules not significantly changed.  No new nodules.  Unchanged enlarged pretracheal nodes.  Increase in fibrotic consolidation of the perihilar and inferior right lung. ?Cycle 9 FOLFIRI 04/12/2021 ?05/04/2021 treatment held due to fatigue ?Cycle 10 FOLFIRI 05/18/2021 ?06/07/2021-treatment held secondary to neutropenia ?  ?  ?2.   Hypothyroid ?  ?3.    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  ?4.  Port-A-Cath placement 09/29/2019, interventional radiology ?  ?5.  Neutropenia and thrombocytopenia following cycle 1 FOLFOX-plan chemotherapy dose reductions, she declined G-CSF ?  ?6.  Mucositis following cycle 5 FOLFOX, 5-fluorouracil dose reduced with cycle 6 ?Mucositis following cycle 22 5-fluorouracil-Magic mouthwash added ?  ?7.  Right lung airspace disease/volume loss-likely toxicity from chest radiation, trial of prednisone 01/29/2020; dyspnea and cough improved 02/11/2020 ?Progressive cough 03/11/2020-prednisone resumed at a dose of 20 mg daily ?Cough and dyspnea improved 03/25/2020-prednisone taper to 15 mg daily ?Improved 04/06/2020-prednisone taper to 10 mg daily ?Stable 04/21/2020-prednisone taper to 5 mg daily ?  ? ? ? ?Disposition: ?Samantha Lutz appears unchanged.  She has neutropenia today.  Chemotherapy will be held.  She will return for the next cycle of chemotherapy in 1 week. ? ?The etiology of the throat/chest fullness she  experienced following the last cycle of chemotherapy is unclear.  This may have been related to cholinergic toxicity from irinotecan.  We will  try atropine premedication with the next cycle of chemotherapy. ? ?The plan is

## 2021-06-09 ENCOUNTER — Inpatient Hospital Stay: Payer: Medicare Other

## 2021-06-14 ENCOUNTER — Telehealth: Payer: Self-pay

## 2021-06-14 ENCOUNTER — Inpatient Hospital Stay: Payer: Medicare Other

## 2021-06-14 VITALS — BP 133/64 | HR 90 | Temp 97.8°F | Resp 18 | Ht 63.0 in | Wt 208.8 lb

## 2021-06-14 DIAGNOSIS — Z5111 Encounter for antineoplastic chemotherapy: Secondary | ICD-10-CM | POA: Diagnosis not present

## 2021-06-14 DIAGNOSIS — C20 Malignant neoplasm of rectum: Secondary | ICD-10-CM

## 2021-06-14 LAB — CBC WITH DIFFERENTIAL (CANCER CENTER ONLY)
Abs Immature Granulocytes: 0.04 10*3/uL (ref 0.00–0.07)
Basophils Absolute: 0 10*3/uL (ref 0.0–0.1)
Basophils Relative: 1 %
Eosinophils Absolute: 0.1 10*3/uL (ref 0.0–0.5)
Eosinophils Relative: 2 %
HCT: 35.3 % — ABNORMAL LOW (ref 36.0–46.0)
Hemoglobin: 11.9 g/dL — ABNORMAL LOW (ref 12.0–15.0)
Immature Granulocytes: 1 %
Lymphocytes Relative: 19 %
Lymphs Abs: 1.2 10*3/uL (ref 0.7–4.0)
MCH: 33.3 pg (ref 26.0–34.0)
MCHC: 33.7 g/dL (ref 30.0–36.0)
MCV: 98.9 fL (ref 80.0–100.0)
Monocytes Absolute: 1.1 10*3/uL — ABNORMAL HIGH (ref 0.1–1.0)
Monocytes Relative: 17 %
Neutro Abs: 4.1 10*3/uL (ref 1.7–7.7)
Neutrophils Relative %: 60 %
Platelet Count: 119 10*3/uL — ABNORMAL LOW (ref 150–400)
RBC: 3.57 MIL/uL — ABNORMAL LOW (ref 3.87–5.11)
RDW: 14.9 % (ref 11.5–15.5)
WBC Count: 6.7 10*3/uL (ref 4.0–10.5)
nRBC: 0 % (ref 0.0–0.2)

## 2021-06-14 MED ORDER — ATROPINE SULFATE 1 MG/ML IV SOLN
0.5000 mg | Freq: Once | INTRAVENOUS | Status: AC
  Administered 2021-06-14: 0.5 mg via INTRAVENOUS
  Filled 2021-06-14: qty 1

## 2021-06-14 MED ORDER — PALONOSETRON HCL INJECTION 0.25 MG/5ML
0.2500 mg | Freq: Once | INTRAVENOUS | Status: AC
  Administered 2021-06-14: 0.25 mg via INTRAVENOUS
  Filled 2021-06-14: qty 5

## 2021-06-14 MED ORDER — SODIUM CHLORIDE 0.9 % IV SOLN
1800.0000 mg/m2 | INTRAVENOUS | Status: DC
  Administered 2021-06-14: 3700 mg via INTRAVENOUS
  Filled 2021-06-14: qty 74

## 2021-06-14 MED ORDER — SODIUM CHLORIDE 0.9 % IV SOLN
300.0000 mg/m2 | Freq: Once | INTRAVENOUS | Status: AC
  Administered 2021-06-14: 618 mg via INTRAVENOUS
  Filled 2021-06-14: qty 30.9

## 2021-06-14 MED ORDER — FLUOROURACIL CHEMO INJECTION 2.5 GM/50ML
300.0000 mg/m2 | Freq: Once | INTRAVENOUS | Status: AC
  Administered 2021-06-14: 600 mg via INTRAVENOUS
  Filled 2021-06-14: qty 12

## 2021-06-14 MED ORDER — SODIUM CHLORIDE 0.9 % IV SOLN
135.0000 mg/m2 | Freq: Once | INTRAVENOUS | Status: AC
  Administered 2021-06-14: 280 mg via INTRAVENOUS
  Filled 2021-06-14: qty 4

## 2021-06-14 MED ORDER — DEXAMETHASONE SODIUM PHOSPHATE 10 MG/ML IJ SOLN
5.0000 mg | Freq: Once | INTRAMUSCULAR | Status: AC
  Administered 2021-06-14: 5 mg via INTRAVENOUS
  Filled 2021-06-14: qty 1

## 2021-06-14 MED ORDER — SODIUM CHLORIDE 0.9 % IV SOLN: Freq: Once | INTRAVENOUS | Status: AC

## 2021-06-14 NOTE — Progress Notes (Signed)
Patient presents for treatment. RN assessment completed along with the following: ? ?Labs/vitals reviewed - Yes, and ok to treat with CMP from 5/9/223    ?Weight within 10% of previous measurement - Yes ?Informed consent completed and reflects current therapy/intent - Yes, on date 10/12/20             ?Provider progress note reviewed - Patient not seen by provider today. Most recent note dated 06/07/21 reviewed. ?Treatment/Antibody/Supportive plan reviewed - Yes, and there are no adjustments needed for today's treatment. ?S&H and other orders reviewed - Yes, and there are no additional orders identified. ?Previous treatment date reviewed - Yes, and the appropriate amount of time has elapsed between treatments. ? ?Patient to proceed with treatment.   ?

## 2021-06-14 NOTE — Patient Instructions (Addendum)
[facility]   ?Discharge Instructions: ?Thank you for choosing [facility] to provide your oncology and hematology care.  ? ?If you have a lab appointment with the [facility], please go directly to the [facility] and check in at the registration area. ?  ?Wear comfortable clothing and clothing appropriate for easy access to any Portacath or PICC line.  ? ?We strive to give you quality time with your provider. You may need to reschedule your appointment if you arrive late (15 or more minutes).  Arriving late affects you and other patients whose appointments are after yours.  Also, if you miss three or more appointments without notifying the office, you may be dismissed from the clinic at the provider?s discretion.    ?  ?For prescription refill requests, have your pharmacy contact our office and allow 72 hours for refills to be completed.   ? ?Today you received the following chemotherapy and/or immunotherapy agents Irinotecan (CAMPTOSAR), Leucovorin & Flourouracil (ADRUCIL).    ?  ?To help prevent nausea and vomiting after your treatment, we encourage you to take your nausea medication as directed. ? ?BELOW ARE SYMPTOMS THAT SHOULD BE REPORTED IMMEDIATELY: ?*FEVER GREATER THAN 100.4 F (38 ?C) OR HIGHER ?*CHILLS OR SWEATING ?*NAUSEA AND VOMITING THAT IS NOT CONTROLLED WITH YOUR NAUSEA MEDICATION ?*UNUSUAL SHORTNESS OF BREATH ?*UNUSUAL BRUISING OR BLEEDING ?*URINARY PROBLEMS (pain or burning when urinating, or frequent urination) ?*BOWEL PROBLEMS (unusual diarrhea, constipation, pain near the anus) ?TENDERNESS IN MOUTH AND THROAT WITH OR WITHOUT PRESENCE OF ULCERS (sore throat, sores in mouth, or a toothache) ?UNUSUAL RASH, SWELLING OR PAIN  ?UNUSUAL VAGINAL DISCHARGE OR ITCHING  ? ?Items with * indicate a potential emergency and should be followed up as soon as possible or go to the Emergency Department if any problems should occur. ? ?Please show the CHEMOTHERAPY ALERT  CARD or IMMUNOTHERAPY ALERT CARD at check-in to the Emergency Department and triage nurse. ? ?Should you have questions after your visit or need to cancel or reschedule your appointment, please contact [facility]  Dept: [phone]  and follow the prompts.  Office hours are 8:00 a.m. to 4:30 p.m. Monday - Friday. Please note that voicemails left after 4:00 p.m. may not be returned until the following business day.  We are closed weekends and major holidays. You have access to a nurse at all times for urgent questions. Please call the main number to the clinic Dept: [phone] and follow the prompts. ? ? ?For any non-urgent questions, you may also contact your provider using MyChart. We now offer e-Visits for anyone 7 and older to request care online for non-urgent symptoms. For details visit [email or web address]. ?  ?Also download the MyChart app! Go to the app store, search "MyChart", open the app, select [facility], and log in with your MyChart username and password. ? ?Due to Covid, a mask is required upon entering the hospital/clinic. If you do not have a mask, one will be given to you upon arrival. For doctor visits, patients may have 1 support person aged 41 or older with them. For treatment visits, patients cannot have anyone with them due to current Covid guidelines and our immunocompromised population.  ? ?Irinotecan injection ?What is this medication? ?IRINOTECAN (ir in oh TEE kan ) is a chemotherapy drug. It is used to treat colon and rectal cancer. ?This medicine may be used for other purposes; ask your health care provider or pharmacist if you  have questions. ?COMMON BRAND NAME(S): Camptosar ?What should I tell my care team before I take this medication? ?They need to know if you have any of these conditions: ?dehydration ?diarrhea ?infection (especially a virus infection such as chickenpox, cold sores, or herpes) ?liver disease ?low blood counts, like low white cell,  platelet, or red cell counts ?low levels of calcium, magnesium, or potassium in the blood ?recent or ongoing radiation therapy ?an unusual or allergic reaction to irinotecan, other medicines, foods, dyes, or preservatives ?pregnant or trying to get pregnant ?breast-feeding ?How should I use this medication? ?This drug is given as an infusion into a vein. It is administered in a hospital or clinic by a specially trained health care professional. ?Talk to your pediatrician regarding the use of this medicine in children. Special care may be needed. ?Overdosage: If you think you have taken too much of this medicine contact a poison control center or emergency room at once. ?NOTE: This medicine is only for you. Do not share this medicine with others. ?What if I miss a dose? ?It is important not to miss your dose. Call your doctor or health care professional if you are unable to keep an appointment. ?What may interact with this medication? ?Do not take this medicine with any of the following medications: ?cobicistat ?itraconazole ?This medicine may interact with the following medications: ?antiviral medicines for HIV or AIDS ?certain antibiotics like rifampin or rifabutin ?certain medicines for fungal infections like ketoconazole, posaconazole, and voriconazole ?certain medicines for seizures like carbamazepine, phenobarbital, phenotoin ?clarithromycin ?gemfibrozil ?nefazodone ?[place] ?This list may not describe all possible interactions. Give your health care provider a list of all the medicines, herbs, non-prescription drugs, or dietary supplements you use. Also tell them if you smoke, drink alcohol, or use illegal drugs. Some items may interact with your medicine. ?What should I watch for while using this medication? ?Your condition will be monitored carefully while you are receiving this medicine. You will need important blood work done while you are taking this medicine. ?This drug may make you feel  generally unwell. This is not uncommon, as chemotherapy can affect healthy cells as well as cancer cells. Report any side effects. Continue your course of treatment even though you feel ill unless your doctor tells you to stop. ?In some cases, you may be given additional medicines to help with side effects. Follow all directions for their use. ?You may get drowsy or dizzy. Do not drive, use machinery, or do anything that needs mental alertness until you know how this medicine affects you. Do not stand or sit up quickly, especially if you are an older patient. This reduces the risk of dizzy or fainting spells. ?Call your health care professional for advice if you get a fever, chills, or sore throat, or other symptoms of a cold or flu. Do not treat yourself. This medicine decreases your body's ability to fight infections. Try to avoid being around people who are sick. ?Avoid taking products that contain aspirin, acetaminophen, ibuprofen, naproxen, or ketoprofen unless instructed by your doctor. These medicines may hide a fever. ?This medicine may increase your risk to bruise or bleed. Call your doctor or health care professional if you notice any unusual bleeding. ?Be careful brushing and flossing your teeth or using a toothpick because you may get an infection or bleed more easily. If you have any dental work done, tell your dentist you are receiving this medicine. ?Do not become pregnant while taking this medicine or for  6 months after stopping it. Women should inform their health care professional if they wish to become pregnant or think they might be pregnant. Men should not father a child while taking this medicine and for 3 months after stopping it. There is potential for serious side effects to an unborn child. Talk to your health care professional for more information. ?Do not breast-feed an infant while taking this medicine or for 7 days after stopping it. ?This medicine has caused ovarian failure in some  women. This medicine may make it more difficult to get pregnant. Talk to your health care professional if you are concerned about your fertility. ?This medicine has caused decreased sperm counts in some men. T

## 2021-06-14 NOTE — Telephone Encounter (Signed)
Patient called in and stated she's having some side effect of the chemo.  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 I spoke with the provider and he advice the patient to take some Benadryl. I also let  the patient know if the symptom worse to call the office. Patient gave verbal understanding and had no further questions or concerns at this time. ? ?

## 2021-06-14 NOTE — Patient Instructions (Signed)

## 2021-06-16 ENCOUNTER — Inpatient Hospital Stay: Payer: Medicare Other

## 2021-06-16 VITALS — BP 136/83 | HR 81 | Temp 98.6°F | Resp 20

## 2021-06-16 DIAGNOSIS — Z5111 Encounter for antineoplastic chemotherapy: Secondary | ICD-10-CM | POA: Diagnosis not present

## 2021-06-16 DIAGNOSIS — C20 Malignant neoplasm of rectum: Secondary | ICD-10-CM

## 2021-06-16 MED ORDER — SODIUM CHLORIDE 0.9% FLUSH
10.0000 mL | INTRAVENOUS | Status: DC | PRN
  Administered 2021-06-16: 10 mL

## 2021-06-16 MED ORDER — HEPARIN SOD (PORK) LOCK FLUSH 100 UNIT/ML IV SOLN
500.0000 [IU] | Freq: Once | INTRAVENOUS | Status: AC | PRN
  Administered 2021-06-16: 500 [IU]

## 2021-06-16 NOTE — Patient Instructions (Signed)
Heparin injection ?What is this medication? ?HEPARIN (HEP a rin) is an anticoagulant. It is used to treat or prevent clots in the veins, arteries, lungs, or heart. It stops clots from forming or getting bigger. This medicine prevents clotting during open-heart surgery, dialysis, or in patients who are confined to bed. ?This medicine may be used for other purposes; ask your health care provider or pharmacist if you have questions. ?COMMON BRAND NAME(S): Hep-Lock, Hep-Lock U/P, Hepflush-10, Monoject Prefill Advanced Heparin Lock Flush, SASH Normal Saline and Heparin ?What should I tell my care team before I take this medication? ?They need to know if you have any of these conditions: ?bleeding disorders, such as hemophilia or low blood platelets ?bowel disease or diverticulitis ?endocarditis ?high blood pressure ?liver disease ?recent surgery or delivery of a baby ?stomach ulcers ?an unusual or allergic reaction to heparin, benzyl alcohol, sulfites, other medicines, foods, dyes, or preservatives ?pregnant or trying to get pregnant ?breast-feeding ?How should I use this medication? ?This medicine is given by injection or infusion into a vein. It can also be given by injection of small amounts under the skin. It is usually given by a health care professional in a hospital or clinic setting. ?If you get this medicine at home, you will be taught how to prepare and give this medicine. Use exactly as directed. Take your medicine at regular intervals. Do not take it more often than directed. Do not stop taking except on your doctor's advice. Stopping this medicine may increase your risk of a blot clot. Be sure to refill your prescription before you run out of medicine. ?It is important that you put your used needles and syringes in a special sharps container. Do not put them in a trash can. If you do not have a sharps container, call your pharmacist or healthcare provider to get one. ?Talk to your pediatrician regarding the  use of this medicine in children. While this medicine may be prescribed for children for selected conditions, precautions do apply. ?Overdosage: If you think you have taken too much of this medicine contact a poison control center or emergency room at once. ?NOTE: This medicine is only for you. Do not share this medicine with others. ?What if I miss a dose? ?If you miss a dose, take it as soon as you can. If it is almost time for your next dose, take only that dose. Do not take double or extra doses. ?What may interact with this medication? ?Do not take this medicine with any of the following medications: ?aspirin and aspirin-like drugs ?mifepristone ?medicines that treat or prevent blood clots like warfarin, enoxaparin, and dalteparin ?palifermin ?protamine ?This medicine may also interact with the following medications: ?dextran ?digoxin ?hydroxychloroquine ?medicines for treating colds or allergies ?nicotine ?NSAIDs, medicines for pain and inflammation, like ibuprofen or naproxen ?phenylbutazone ?tetracycline antibiotics ?This list may not describe all possible interactions. Give your health care provider a list of all the medicines, herbs, non-prescription drugs, or dietary supplements you use. Also tell them if you smoke, drink alcohol, or use illegal drugs. Some items may interact with your medicine. ?What should I watch for while using this medication? ?Visit your healthcare professional for regular checks on your progress. You may need blood work done while you are taking this medicine. Your condition will be monitored carefully while you are receiving this medicine. It is important not to miss any appointments. ?Wear a medical ID bracelet or chain, and carry a card that describes your disease and details   of your medicine and dosage times. ?Notify your doctor or healthcare professional at once if you have cold, blue hands or feet. ?If you are going to need surgery or other procedure, tell your healthcare  professional that you are using this medicine. ?Avoid sports and activities that might cause injury while you are using this medicine. Severe falls or injuries can cause unseen bleeding. Be careful when using sharp tools or knives. Consider using an electric razor. Take special care brushing or flossing your teeth. Report any injuries, bruising, or red spots on the skin to your healthcare professional. ?Using this medicine for a long time may weaken your bones and increase the risk of bone fractures. ?You should make sure that you get enough calcium and vitamin D while you are taking this medicine. Discuss the foods you eat and the vitamins you take with your healthcare professional. ?Wear a medical ID bracelet or chain. Carry a card that describes your disease and details of your medicine and dosage times. ?What side effects may I notice from receiving this medication? ?Side effects that you should report to your doctor or health care professional as soon as possible: ?allergic reactions like skin rash, itching or hives, swelling of the face, lips, or tongue ?bone pain ?fever, chills ?nausea, vomiting ?signs and symptoms of bleeding such as bloody or black, tarry stools; red or dark-brown urine; spitting up blood or brown material that looks like coffee grounds; red spots on the skin; unusual bruising or bleeding from the eye, gums, or nose ?signs and symptoms of a blood clot such as chest pain; shortness of breath; pain, swelling, or warmth in the leg ?signs and symptoms of a stroke such as changes in vision; confusion; trouble speaking or understanding; severe headaches; sudden numbness or weakness of the face, arm or leg; trouble walking; dizziness; loss of coordination ?Side effects that usually do not require medical attention (report to your doctor or health care professional if they continue or are bothersome): ?hair loss ?pain, redness, or irritation at site where injected ?This list may not describe all  possible side effects. Call your doctor for medical advice about side effects. You may report side effects to FDA at 1-800-FDA-1088. ?Where should I keep my medication? ?Keep out of the reach of children. ?Store unopened vials at room temperature between 15 and 30 degrees C (59 and 86 degrees F). Do not freeze. Do not use if solution is discolored or particulate matter is present. Throw away any unused medicine after the expiration date. ?NOTE: This sheet is a summary. It may not cover all possible information. If you have questions about this medicine, talk to your doctor, pharmacist, or health care provider. ?? 2023 [employer]vier/Gold Standard (2020-02-26 00:00:00) ? ?

## 2021-07-03 ENCOUNTER — Other Ambulatory Visit: Payer: Self-pay | Admitting: Oncology

## 2021-07-05 ENCOUNTER — Inpatient Hospital Stay: Payer: Medicare Other

## 2021-07-05 ENCOUNTER — Encounter: Payer: Self-pay | Admitting: *Deleted

## 2021-07-05 ENCOUNTER — Inpatient Hospital Stay: Payer: Medicare Other | Attending: Oncology | Admitting: Nurse Practitioner

## 2021-07-05 ENCOUNTER — Encounter: Payer: Self-pay | Admitting: Nurse Practitioner

## 2021-07-05 VITALS — BP 154/74 | HR 88 | Temp 98.1°F | Resp 18 | Ht 63.0 in | Wt 211.8 lb

## 2021-07-05 VITALS — BP 143/66 | HR 76

## 2021-07-05 DIAGNOSIS — G629 Polyneuropathy, unspecified: Secondary | ICD-10-CM | POA: Insufficient documentation

## 2021-07-05 DIAGNOSIS — R918 Other nonspecific abnormal finding of lung field: Secondary | ICD-10-CM | POA: Insufficient documentation

## 2021-07-05 DIAGNOSIS — E039 Hypothyroidism, unspecified: Secondary | ICD-10-CM | POA: Diagnosis not present

## 2021-07-05 DIAGNOSIS — C787 Secondary malignant neoplasm of liver and intrahepatic bile duct: Secondary | ICD-10-CM | POA: Insufficient documentation

## 2021-07-05 DIAGNOSIS — C20 Malignant neoplasm of rectum: Secondary | ICD-10-CM | POA: Diagnosis present

## 2021-07-05 DIAGNOSIS — Z5111 Encounter for antineoplastic chemotherapy: Secondary | ICD-10-CM | POA: Insufficient documentation

## 2021-07-05 DIAGNOSIS — K1230 Oral mucositis (ulcerative), unspecified: Secondary | ICD-10-CM | POA: Insufficient documentation

## 2021-07-05 DIAGNOSIS — D696 Thrombocytopenia, unspecified: Secondary | ICD-10-CM | POA: Diagnosis not present

## 2021-07-05 DIAGNOSIS — Z923 Personal history of irradiation: Secondary | ICD-10-CM | POA: Insufficient documentation

## 2021-07-05 LAB — CBC WITH DIFFERENTIAL (CANCER CENTER ONLY)
Abs Immature Granulocytes: 0.06 10*3/uL (ref 0.00–0.07)
Basophils Absolute: 0 10*3/uL (ref 0.0–0.1)
Basophils Relative: 1 %
Eosinophils Absolute: 0.1 10*3/uL (ref 0.0–0.5)
Eosinophils Relative: 2 %
HCT: 32 % — ABNORMAL LOW (ref 36.0–46.0)
Hemoglobin: 10.9 g/dL — ABNORMAL LOW (ref 12.0–15.0)
Immature Granulocytes: 2 %
Lymphocytes Relative: 25 %
Lymphs Abs: 0.9 10*3/uL (ref 0.7–4.0)
MCH: 34.2 pg — ABNORMAL HIGH (ref 26.0–34.0)
MCHC: 34.1 g/dL (ref 30.0–36.0)
MCV: 100.3 fL — ABNORMAL HIGH (ref 80.0–100.0)
Monocytes Absolute: 0.7 10*3/uL (ref 0.1–1.0)
Monocytes Relative: 19 %
Neutro Abs: 1.9 10*3/uL (ref 1.7–7.7)
Neutrophils Relative %: 51 %
Platelet Count: 111 10*3/uL — ABNORMAL LOW (ref 150–400)
RBC: 3.19 MIL/uL — ABNORMAL LOW (ref 3.87–5.11)
RDW: 15.3 % (ref 11.5–15.5)
WBC Count: 3.7 10*3/uL — ABNORMAL LOW (ref 4.0–10.5)
nRBC: 0 % (ref 0.0–0.2)

## 2021-07-05 LAB — CMP (CANCER CENTER ONLY)
ALT: 19 U/L (ref 0–44)
AST: 23 U/L (ref 15–41)
Albumin: 3.4 g/dL — ABNORMAL LOW (ref 3.5–5.0)
Alkaline Phosphatase: 50 U/L (ref 38–126)
Anion gap: 10 (ref 5–15)
BUN: 16 mg/dL (ref 8–23)
CO2: 25 mmol/L (ref 22–32)
Calcium: 9.2 mg/dL (ref 8.9–10.3)
Chloride: 106 mmol/L (ref 98–111)
Creatinine: 0.9 mg/dL (ref 0.44–1.00)
GFR, Estimated: >60 mL/min (ref >60)
Glucose, Bld: 94 mg/dL (ref 70–99)
Potassium: 3.9 mmol/L (ref 3.5–5.1)
Sodium: 141 mmol/L (ref 135–145)
Total Bilirubin: 0.8 mg/dL (ref 0.3–1.2)
Total Protein: 6.4 g/dL — ABNORMAL LOW (ref 6.5–8.1)

## 2021-07-05 MED ORDER — SODIUM CHLORIDE 0.9 % IV SOLN: Freq: Once | INTRAVENOUS | Status: AC

## 2021-07-05 MED ORDER — FLUOROURACIL CHEMO INJECTION 2.5 GM/50ML
300.0000 mg/m2 | Freq: Once | INTRAVENOUS | Status: AC
  Administered 2021-07-05: 600 mg via INTRAVENOUS
  Filled 2021-07-05: qty 12

## 2021-07-05 MED ORDER — ATROPINE SULFATE 1 MG/ML IV SOLN
0.5000 mg | Freq: Once | INTRAVENOUS | Status: AC
  Administered 2021-07-05: 0.5 mg via INTRAVENOUS
  Filled 2021-07-05: qty 1

## 2021-07-05 MED ORDER — SODIUM CHLORIDE 0.9 % IV SOLN
4000.0000 mg | INTRAVENOUS | Status: DC
  Administered 2021-07-05: 4000 mg via INTRAVENOUS
  Filled 2021-07-05: qty 80

## 2021-07-05 MED ORDER — SODIUM CHLORIDE 0.9 % IV SOLN
135.0000 mg/m2 | Freq: Once | INTRAVENOUS | Status: AC
  Administered 2021-07-05: 280 mg via INTRAVENOUS
  Filled 2021-07-05: qty 14

## 2021-07-05 MED ORDER — SODIUM CHLORIDE 0.9 % IV SOLN
300.0000 mg/m2 | Freq: Once | INTRAVENOUS | Status: AC
  Administered 2021-07-05: 618 mg via INTRAVENOUS
  Filled 2021-07-05: qty 30.9

## 2021-07-05 MED ORDER — PALONOSETRON HCL INJECTION 0.25 MG/5ML
0.2500 mg | Freq: Once | INTRAVENOUS | Status: AC
  Administered 2021-07-05: 0.25 mg via INTRAVENOUS
  Filled 2021-07-05: qty 5

## 2021-07-05 MED ORDER — DEXAMETHASONE SODIUM PHOSPHATE 10 MG/ML IJ SOLN
5.0000 mg | Freq: Once | INTRAMUSCULAR | Status: AC
  Administered 2021-07-05: 5 mg via INTRAVENOUS
  Filled 2021-07-05: qty 1

## 2021-07-05 NOTE — Progress Notes (Signed)
Patient seen by Lisa Thomas NP today  Vitals are within treatment parameters.  Labs reviewed by Lisa Thomas NP and are within treatment parameters.  Per physician team, patient is ready for treatment and there are NO modifications to the treatment plan.     

## 2021-07-05 NOTE — Progress Notes (Signed)
[facility] OFFICE PROGRESS NOTE   Diagnosis: Rectal cancer  INTERVAL HISTORY:   Samantha Lutz returns as scheduled.  She completed another cycle of FOLFIRI 06/14/2021.  She has mild intermittent nausea.  No mouth sores.  No diarrhea.  On the day of treatment her jaw "locked" for about 2 hours.  This has not recurred.  Objective:  Vital signs in last 24 hours:  Blood pressure (!) 154/74, pulse 88, temperature 98.1 F (36.7 C), temperature source Oral, resp. rate 18, height '5\' 3"'  (1.6 m), weight 211 lb 12.8 oz (96.1 kg), SpO2 98 %.    HEENT: No thrush or ulcers. Resp: Lungs clear bilaterally. Cardio: Regular rate and rhythm. GI: Abdomen soft and nontender.  No hepatomegaly. Vascular: No leg edema. Port-A-Cath without erythema.  Lab Results:  Lab Results  Component Value Date   WBC 3.7 (L) 07/05/2021   HGB 10.9 (L) 07/05/2021   HCT 32.0 (L) 07/05/2021   MCV 100.3 (H) 07/05/2021   PLT 111 (L) 07/05/2021   NEUTROABS 1.9 07/05/2021    Imaging:  No results found.  Medications: I have reviewed the patient's current medications.  Assessment/Plan: Rectal cancer Mass at 7 cm from the anal verge on colonoscopy 08/09/2017, biopsy revealed invasive adenocarcinoma Staging CTs 08/17/2017-no evidence of metastatic disease, asymmetric thickening in the mid rectum MR pelvis 09/01/2017, T3N0 lesion beginning at 6.3 cm from the anal sphincter Radiation/Xeloda initiated 09/17/2017, completed 10/25/2017 Low anterior resection/diverting ileostomy 12/21/2017,ypT3,ypN1a tumor.  Lymphovascular invasion present, intact mismatch repair protein expression, treatment effect present (TRS 1).  Mismatch repair protein IHC normal; Foundation 1-KRAS/NRAS wild-type, microsatellite status and tumor mutational burden could not be determined. Cycle 1 adjuvant Xeloda beginning 01/21/2018 Cycle 2 adjuvant Xeloda beginning 02/11/2018 Xeloda discontinued after cycle 2 secondary to patient  preference Ileostomy takedown 07/10/2018 CTs 09/07/2018- multiple live small pulmonary nodules concerning for metastatic disease CT chest 12/10/2018-multiple bilateral lung nodules, some have increased in size CT chest 04/08/2019-mild enlargement of bilateral lung nodules Status post SBRT multiple lung nodules 05/06/2019, 05/08/2019, 05/13/2019 CT chest 08/28/2019-improvement and resolution in majority of right-sided pulmonary nodules, a superior segment right lower lobe nodule has increased, no new right-sided nodules.  Progressive enlargement of left-sided pulmonary nodules PET scan 09/09/2019-hypermetabolic right lower lobe nodule, 2 hypermetabolic left upper lobe nodules with an additional 0.7 cm left upper lobe nodule below PET resolution, groundglass opacity in the mid to right lower lobe with associated hypermetabolism consistent with postradiation change, hypermetabolic central segment 4A liver lesion Cycle 1 FOLFOX 10/14/2019 Cycle  2FOLFOX 11/11/2019, 5-FU and oxaliplatin dose reduced secondary to neutropenia and thrombocytopenia following cycle one, G-CSF declined Cycle 3 FOLFOX 11/26/2019 Cycle 4 FOLFOX 12/11/2019, oxaliplatin held due to neutropenia and thrombocytopenia Cycle 5 FOLFOX 12/31/2019 Cycle 6 FOLFOX 01/14/2020 (oxaliplatin held, 5-fluorouracil dose reduced due to mucositis) CT chest 01/27/2020-improvement in left lung nodules, progressive airspace disease with traction bronchiectasis throughout the right lung, previously noted right lung nodules are obscured, mildly enlarged right paratracheal lymph nodes-not hypermetabolic on prior PET, likely reactive Cycle 7 FOLFOX 01/28/2020 Cycle 8 FOLFOX 02/11/2020 (oxaliplatin held due to neutropenia and thrombocytopenia) Cycle 9 FOLFOX 02/25/2020 (oxaliplatin held due to thrombocytopenia) Cycle 10 FOLFOX 03/11/2020 (oxaliplatin held secondary to neuropathy) Cycle 11 FOLFOX 03/25/2020 (oxaliplatin held due to neuropathy) CT chest 04/05/2020-no new or  progressive findings.  Interval evolution of presumed postradiation scarring in the right perihilar lung with decrease in the more diffuse groundglass opacity seen previously.  No substantial change in left lung nodules. 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  No definite CT findings to suggest recurrent tumor.  Stable small left upper lobe pulmonary nodules.  No new or progressive findings.  Stable small right paratracheal lymph nodes.  Stable area of irregular low-attenuation in hepatic segment 4A, site of known prior hepatic metastatic lesion.  No new or progressive findings. Cycle 18 5-fluorouracil 07/06/2020 Cycle 19 5-fluorouracil 07/20/2020 Cycle 20 5-fluorouracil 08/03/2020 Cycle 21 5-fluorouracil 08/17/2020  Cycle 22 5-fluorouracil 08/31/2020 Cycle 23 5-fluorouracil 09/14/2020 CT chest 09/24/2020-increased number and size of pulmonary nodules bilaterally.  Findings in the right upper lobe are concerning for potential lymphangitic spread of tumor.  Right upper lobe findings could also reflect evolving postradiation change. Cycle 1 FOLFIRI 10/12/2020 Cycle 2 FOLFIRI 11/08/2020, irinotecan dose reduced due to neutropenia and thrombocytopenia following cycle 1 Cycle 3 FOLFIRI 12/01/2020 Cycle 4 FOLFIRI 12/22/2020 CT chest 01/11/2021-similar bilateral pulmonary nodules.  Left upper lobe nodule has mildly decreased in size.  No new nodules. Cycle 5 FOLFIRI 01/12/2021 Cycle 6 FOLFIRI 02/03/2021 Cycle 7 FOLFIRI 03/01/2021 Cycle 8 FOLFIRI 03/22/2021 CT chest 04/08/2021-multiple small bilateral lung nodules not significantly changed.  No new nodules.  Unchanged enlarged pretracheal nodes.  Increase in fibrotic  consolidation of the perihilar and inferior right lung. Cycle 9 FOLFIRI 04/12/2021 05/04/2021 treatment held due to fatigue Cycle 10 FOLFIRI 05/18/2021 06/07/2021-treatment held secondary to neutropenia Cycle 11 FOLFIRI 06/14/2021 Cycle 12 FOLFIRI 07/05/2021     2.   Hypothyroid   3.    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  Port-A-Cath placement 09/29/2019, interventional radiology   5.  Neutropenia and thrombocytopenia following cycle 1 FOLFOX-plan chemotherapy dose reductions, she declined G-CSF   6.  Mucositis following cycle 5 FOLFOX, 5-fluorouracil dose reduced with cycle 6 Mucositis following cycle 22 5-fluorouracil-Magic mouthwash added   7.  Right lung airspace disease/volume loss-likely toxicity from chest radiation, trial of prednisone 01/29/2020; dyspnea and cough improved 02/11/2020 Progressive cough 03/11/2020-prednisone resumed at a dose of 20 mg daily Cough and dyspnea improved 03/25/2020-prednisone taper to 15 mg daily Improved 04/06/2020-prednisone taper to 10 mg daily Stable 04/21/2020-prednisone taper to 5 mg daily    Disposition: Samantha Lutz appears stable.  She has completed 11 cycles of FOLFIRI.  Plan to proceed with cycle 12 today as scheduled.  CBC and chemistry panel reviewed.  Labs adequate to proceed with treatment.  She will return for lab, follow-up, FOLFIRI in 3 weeks.  She will contact the office in the interim with any problems.    Ned Card ANP/GNP-BC   07/05/2021  10:47 AM

## 2021-07-05 NOTE — Patient Instructions (Addendum)
[place]  The chemotherapy medication bag should finish at 46 hours, 96 hours, or 7 days. For example, if your pump is scheduled for 46 hours and it was put on at 4:00 p.m., it should finish at 2:00 p.m. the day it is scheduled to come off regardless of your appointment time.     Estimated time to finish at 12:30 Thursday, June, 8 2023.   If the display on your pump reads "Low Volume" and it is beeping, take the batteries out of the pump and come to the cancer center for it to be taken off.   If the pump alarms go off prior to the pump reading "Low Volume" then call [phone] and someone can assist you.  If the plunger comes out and the chemotherapy medication is leaking out, please use your home chemo spill kit to clean up the spill. Do NOT use paper towels or other household products.  If you have problems or questions regarding your pump, please call either [phone] (24 hours a day) or the cancer center Monday-Friday 8:00 a.m.- 4:30 p.m. at the clinic number and we will assist you. If you are unable to get assistance, then go to the nearest Emergency Department and ask the staff to contact the IV team for assistance.   Discharge Instructions: Thank you for choosing [facility] to provide your oncology and hematology care.   If you have a lab appointment with the [facility], please go directly to the [facility] and check in at the registration area.   Wear comfortable clothing and clothing appropriate for easy access to any Portacath or PICC line.   We strive to give you quality time with your provider. You may need to reschedule your appointment if you arrive late (15 or more minutes).  Arriving late affects you and other patients whose appointments are after yours.  Also, if you miss three or more appointments without notifying the office, you may be dismissed from the clinic at the provider's discretion.      For prescription  refill requests, have your pharmacy contact our office and allow 72 hours for refills to be completed.    Today you received the following chemotherapy and/or immunotherapy agents Irinotecan, Leucovorin, Fluorouracil.      To help prevent nausea and vomiting after your treatment, we encourage you to take your nausea medication as directed.  BELOW ARE SYMPTOMS THAT SHOULD BE REPORTED IMMEDIATELY: *FEVER GREATER THAN 100.4 F (38 C) OR HIGHER *CHILLS OR SWEATING *NAUSEA AND VOMITING THAT IS NOT CONTROLLED WITH YOUR NAUSEA MEDICATION *UNUSUAL SHORTNESS OF BREATH *UNUSUAL BRUISING OR BLEEDING *URINARY PROBLEMS (pain or burning when urinating, or frequent urination) *BOWEL PROBLEMS (unusual diarrhea, constipation, pain near the anus) TENDERNESS IN MOUTH AND THROAT WITH OR WITHOUT PRESENCE OF ULCERS (sore throat, sores in mouth, or a toothache) UNUSUAL RASH, SWELLING OR PAIN  UNUSUAL VAGINAL DISCHARGE OR ITCHING   Items with * indicate a potential emergency and should be followed up as soon as possible or go to the Emergency Department if any problems should occur.  Please show the CHEMOTHERAPY ALERT CARD or IMMUNOTHERAPY ALERT CARD at check-in to the Emergency Department and triage nurse.  Should you have questions after your visit or need to cancel or reschedule your appointment, please contact [facility]  Dept: [phone]  and follow the prompts.  Office hours are 8:00 a.m. to 4:30 p.m. Monday - Friday. Please note that voicemails  left after 4:00 p.m. may not be returned until the following business day.  We are closed weekends and major holidays. You have access to a nurse at all times for urgent questions. Please call the main number to the clinic Dept: [phone] and follow the prompts.   For any non-urgent questions, you may also contact your provider using MyChart. We now offer e-Visits for anyone 59 and older to request care online for non-urgent symptoms.  For details visit [email or web address].   Also download the MyChart app! Go to the app store, search "MyChart", open the app, select [facility], and log in with your MyChart username and password.  Due to Covid, a mask is required upon entering the hospital/clinic. If you do not have a mask, one will be given to you upon arrival. For doctor visits, patients may have 1 support person aged 52 or older with them. For treatment visits, patients cannot have anyone with them due to current Covid guidelines and our immunocompromised population.   Irinotecan injection What is this medication? IRINOTECAN (ir in oh TEE kan ) is a chemotherapy drug. It is used to treat colon and rectal cancer. This medicine may be used for other purposes; ask your health care provider or pharmacist if you have questions. COMMON BRAND NAME(S): Camptosar What should I tell my care team before I take this medication? They need to know if you have any of these conditions: dehydration diarrhea infection (especially a virus infection such as chickenpox, cold sores, or herpes) liver disease low blood counts, like low white cell, platelet, or red cell counts low levels of calcium, magnesium, or potassium in the blood recent or ongoing radiation therapy an unusual or allergic reaction to irinotecan, other medicines, foods, dyes, or preservatives pregnant or trying to get pregnant breast-feeding How should I use this medication? This drug is given as an infusion into a vein. It is administered in a hospital or clinic by a specially trained health care professional. Talk to your pediatrician regarding the use of this medicine in children. Special care may be needed. Overdosage: If you think you have taken too much of this medicine contact a poison control center or emergency room at once. NOTE: This medicine is only for you. Do not share this medicine with others. What if I miss a dose? It is important not to miss your  dose. Call your doctor or health care professional if you are unable to keep an appointment. What may interact with this medication? Do not take this medicine with any of the following medications: cobicistat itraconazole This medicine may interact with the following medications: antiviral medicines for HIV or AIDS certain antibiotics like rifampin or rifabutin certain medicines for fungal infections like ketoconazole, posaconazole, and voriconazole certain medicines for seizures like carbamazepine, phenobarbital, phenotoin clarithromycin gemfibrozil nefazodone [facility]. John's Wort This list may not describe all possible interactions. Give your health care provider a list of all the medicines, herbs, non-prescription drugs, or dietary supplements you use. Also tell them if you smoke, drink alcohol, or use illegal drugs. Some items may interact with your medicine. What should I watch for while using this medication? Your condition will be monitored carefully while you are receiving this medicine. You will need important blood work done while you are taking this medicine. This drug may make you feel generally unwell. This is not uncommon, as chemotherapy can affect healthy cells as well as cancer cells. Report any side effects. Continue your course of treatment even though you  feel ill unless your doctor tells you to stop. In some cases, you may be given additional medicines to help with side effects. Follow all directions for their use. You may get drowsy or dizzy. Do not drive, use machinery, or do anything that needs mental alertness until you know how this medicine affects you. Do not stand or sit up quickly, especially if you are an older patient. This reduces the risk of dizzy or fainting spells. Call your health care professional for advice if you get a fever, chills, or sore throat, or other symptoms of a cold or flu. Do not treat yourself. This medicine decreases your body's ability to fight  infections. Try to avoid being around people who are sick. Avoid taking products that contain aspirin, acetaminophen, ibuprofen, naproxen, or ketoprofen unless instructed by your doctor. These medicines may hide a fever. This medicine may increase your risk to bruise or bleed. Call your doctor or health care professional if you notice any unusual bleeding. Be careful brushing and flossing your teeth or using a toothpick because you may get an infection or bleed more easily. If you have any dental work done, tell your dentist you are receiving this medicine. Do not become pregnant while taking this medicine or for 6 months after stopping it. Women should inform their health care professional if they wish to become pregnant or think they might be pregnant. Men should not father a child while taking this medicine and for 3 months after stopping it. There is potential for serious side effects to an unborn child. Talk to your health care professional for more information. Do not breast-feed an infant while taking this medicine or for 7 days after stopping it. This medicine has caused ovarian failure in some women. This medicine may make it more difficult to get pregnant. Talk to your health care professional if you are concerned about your fertility. This medicine has caused decreased sperm counts in some men. This may make it more difficult to father a child. Talk to your health care professional if you are concerned about your fertility. What side effects may I notice from receiving this medication? Side effects that you should report to your doctor or health care professional as soon as possible: allergic reactions like skin rash, itching or hives, swelling of the face, lips, or tongue chest pain diarrhea flushing, runny nose, sweating during infusion low blood counts - this medicine may decrease the number of white blood cells, red blood cells and platelets. You may be at increased risk for infections  and bleeding. nausea, vomiting pain, swelling, warmth in the leg signs of decreased platelets or bleeding - bruising, pinpoint red spots on the skin, black, tarry stools, blood in the urine signs of infection - fever or chills, cough, sore throat, pain or difficulty passing urine signs of decreased red blood cells - unusually weak or tired, fainting spells, lightheadedness Side effects that usually do not require medical attention (report to your doctor or health care professional if they continue or are bothersome): constipation hair loss headache loss of appetite mouth sores stomach pain This list may not describe all possible side effects. Call your doctor for medical advice about side effects. You may report side effects to FDA at 1-800-FDA-1088. Where should I keep my medication? This drug is given in a hospital or clinic and will not be stored at home. NOTE: This sheet is a summary. It may not cover all possible information. If you have questions about this medicine,  talk to your doctor, pharmacist, or health care provider.  2023 Elsevier/Gold Standard (2020-12-17 00:00:00)  Leucovorin injection What is this medication? LEUCOVORIN (loo koe VOR in) is used to prevent or treat the harmful effects of some medicines. This medicine is used to treat anemia caused by a low amount of folic acid in the body. It is also used with 5-fluorouracil (5-FU) to treat colon cancer. This medicine may be used for other purposes; ask your health care provider or pharmacist if you have questions. What should I tell my care team before I take this medication? They need to know if you have any of these conditions: anemia from low levels of vitamin B-12 in the blood an unusual or allergic reaction to leucovorin, folic acid, other medicines, foods, dyes, or preservatives pregnant or trying to get pregnant breast-feeding How should I use this medication? This medicine is for injection into a muscle or into  a vein. It is given by a health care professional in a hospital or clinic setting. Talk to your pediatrician regarding the use of this medicine in children. Special care may be needed. Overdosage: If you think you have taken too much of this medicine contact a poison control center or emergency room at once. NOTE: This medicine is only for you. Do not share this medicine with others. What if I miss a dose? This does not apply. What may interact with this medication? capecitabine fluorouracil phenobarbital phenytoin primidone trimethoprim-sulfamethoxazole This list may not describe all possible interactions. Give your health care provider a list of all the medicines, herbs, non-prescription drugs, or dietary supplements you use. Also tell them if you smoke, drink alcohol, or use illegal drugs. Some items may interact with your medicine. What should I watch for while using this medication? Your condition will be monitored carefully while you are receiving this medicine. This medicine may increase the side effects of 5-fluorouracil, 5-FU. Tell your doctor or health care professional if you have diarrhea or mouth sores that do not get better or that get worse. What side effects may I notice from receiving this medication? Side effects that you should report to your doctor or health care professional as soon as possible: allergic reactions like skin rash, itching or hives, swelling of the face, lips, or tongue breathing problems fever, infection mouth sores unusual bleeding or bruising unusually weak or tired Side effects that usually do not require medical attention (report to your doctor or health care professional if they continue or are bothersome): constipation or diarrhea loss of appetite nausea, vomiting This list may not describe all possible side effects. Call your doctor for medical advice about side effects. You may report side effects to FDA at 1-800-FDA-1088. Where should I keep  my medication? This drug is given in a hospital or clinic and will not be stored at home. NOTE: This sheet is a summary. It may not cover all possible information. If you have questions about this medicine, talk to your doctor, pharmacist, or health care provider.  2023 Elsevier/Gold Standard (2007-07-25 00:00:00)  Fluorouracil, 5-FU injection What is this medication? FLUOROURACIL, 5-FU (flure oh YOOR a sil) is a chemotherapy drug. It slows the growth of cancer cells. This medicine is used to treat many types of cancer like breast cancer, colon or rectal cancer, pancreatic cancer, and stomach cancer. This medicine may be used for other purposes; ask your health care provider or pharmacist if you have questions. COMMON BRAND NAME(S): Adrucil What should I tell my care team  before I take this medication? They need to know if you have any of these conditions: blood disorders dihydropyrimidine dehydrogenase (DPD) deficiency infection (especially a virus infection such as chickenpox, cold sores, or herpes) kidney disease liver disease malnourished, poor nutrition recent or ongoing radiation therapy an unusual or allergic reaction to fluorouracil, other chemotherapy, other medicines, foods, dyes, or preservatives pregnant or trying to get pregnant breast-feeding How should I use this medication? This drug is given as an infusion or injection into a vein. It is administered in a hospital or clinic by a specially trained health care professional. Talk to your pediatrician regarding the use of this medicine in children. Special care may be needed. Overdosage: If you think you have taken too much of this medicine contact a poison control center or emergency room at once. NOTE: This medicine is only for you. Do not share this medicine with others. What if I miss a dose? It is important not to miss your dose. Call your doctor or health care professional if you are unable to keep an  appointment. What may interact with this medication? Do not take this medicine with any of the following medications: live virus vaccines This medicine may also interact with the following medications: medicines that treat or prevent blood clots like warfarin, enoxaparin, and dalteparin This list may not describe all possible interactions. Give your health care provider a list of all the medicines, herbs, non-prescription drugs, or dietary supplements you use. Also tell them if you smoke, drink alcohol, or use illegal drugs. Some items may interact with your medicine. What should I watch for while using this medication? Visit your doctor for checks on your progress. This drug may make you feel generally unwell. This is not uncommon, as chemotherapy can affect healthy cells as well as cancer cells. Report any side effects. Continue your course of treatment even though you feel ill unless your doctor tells you to stop. In some cases, you may be given additional medicines to help with side effects. Follow all directions for their use. Call your doctor or health care professional for advice if you get a fever, chills or sore throat, or other symptoms of a cold or flu. Do not treat yourself. This drug decreases your body's ability to fight infections. Try to avoid being around people who are sick. This medicine may increase your risk to bruise or bleed. Call your doctor or health care professional if you notice any unusual bleeding. Be careful brushing and flossing your teeth or using a toothpick because you may get an infection or bleed more easily. If you have any dental work done, tell your dentist you are receiving this medicine. Avoid taking products that contain aspirin, acetaminophen, ibuprofen, naproxen, or ketoprofen unless instructed by your doctor. These medicines may hide a fever. Do not become pregnant while taking this medicine. Women should inform their doctor if they wish to become pregnant  or think they might be pregnant. There is a potential for serious side effects to an unborn child. Talk to your health care professional or pharmacist for more information. Do not breast-feed an infant while taking this medicine. Men should inform their doctor if they wish to father a child. This medicine may lower sperm counts. Do not treat diarrhea with over the counter products. Contact your doctor if you have diarrhea that lasts more than 2 days or if it is severe and watery. This medicine can make you more sensitive to the sun. Keep out of the  sun. If you cannot avoid being in the sun, wear protective clothing and use sunscreen. Do not use sun lamps or tanning beds/booths. What side effects may I notice from receiving this medication? Side effects that you should report to your doctor or health care professional as soon as possible: allergic reactions like skin rash, itching or hives, swelling of the face, lips, or tongue low blood counts - this medicine may decrease the number of white blood cells, red blood cells and platelets. You may be at increased risk for infections and bleeding. signs of infection - fever or chills, cough, sore throat, pain or difficulty passing urine signs of decreased platelets or bleeding - bruising, pinpoint red spots on the skin, black, tarry stools, blood in the urine signs of decreased red blood cells - unusually weak or tired, fainting spells, lightheadedness breathing problems changes in vision chest pain mouth sores nausea and vomiting pain, swelling, redness at site where injected pain, tingling, numbness in the hands or feet redness, swelling, or sores on hands or feet stomach pain unusual bleeding Side effects that usually do not require medical attention (report to your doctor or health care professional if they continue or are bothersome): changes in finger or toe nails diarrhea dry or itchy skin hair loss headache loss of appetite sensitivity  of eyes to the light stomach upset unusually teary eyes This list may not describe all possible side effects. Call your doctor for medical advice about side effects. You may report side effects to FDA at 1-800-FDA-1088. Where should I keep my medication? This drug is given in a hospital or clinic and will not be stored at home. NOTE: This sheet is a summary. It may not cover all possible information. If you have questions about this medicine, talk to your doctor, pharmacist, or health care provider.  2023 Elsevier/Gold Standard (2020-12-17 00:00:00)

## 2021-07-07 ENCOUNTER — Inpatient Hospital Stay: Payer: Medicare Other

## 2021-07-07 VITALS — BP 128/65 | HR 79 | Temp 98.3°F | Resp 19

## 2021-07-07 DIAGNOSIS — Z5111 Encounter for antineoplastic chemotherapy: Secondary | ICD-10-CM | POA: Diagnosis not present

## 2021-07-07 DIAGNOSIS — C20 Malignant neoplasm of rectum: Secondary | ICD-10-CM

## 2021-07-07 MED ORDER — SODIUM CHLORIDE 0.9% FLUSH
10.0000 mL | INTRAVENOUS | Status: DC | PRN
  Administered 2021-07-07: 10 mL

## 2021-07-07 MED ORDER — HEPARIN SOD (PORK) LOCK FLUSH 100 UNIT/ML IV SOLN
500.0000 [IU] | Freq: Once | INTRAVENOUS | Status: AC | PRN
  Administered 2021-07-07: 500 [IU]

## 2021-07-07 NOTE — Patient Instructions (Signed)

## 2021-07-24 ENCOUNTER — Other Ambulatory Visit: Payer: Self-pay | Admitting: Oncology

## 2021-07-26 ENCOUNTER — Encounter: Payer: Self-pay | Admitting: Nurse Practitioner

## 2021-07-26 ENCOUNTER — Inpatient Hospital Stay: Payer: Medicare Other

## 2021-07-26 ENCOUNTER — Inpatient Hospital Stay: Payer: Medicare Other | Admitting: Nurse Practitioner

## 2021-07-26 VITALS — BP 144/70 | HR 87 | Temp 98.2°F | Resp 18 | Ht 63.0 in | Wt 214.0 lb

## 2021-07-26 DIAGNOSIS — C20 Malignant neoplasm of rectum: Secondary | ICD-10-CM

## 2021-07-26 DIAGNOSIS — Z5111 Encounter for antineoplastic chemotherapy: Secondary | ICD-10-CM | POA: Diagnosis not present

## 2021-07-26 DIAGNOSIS — Z95828 Presence of other vascular implants and grafts: Secondary | ICD-10-CM

## 2021-07-26 LAB — CMP (CANCER CENTER ONLY)
ALT: 21 U/L (ref 0–44)
AST: 27 U/L (ref 15–41)
Albumin: 3.7 g/dL (ref 3.5–5.0)
Alkaline Phosphatase: 51 U/L (ref 38–126)
Anion gap: 9 (ref 5–15)
BUN: 17 mg/dL (ref 8–23)
CO2: 24 mmol/L (ref 22–32)
Calcium: 9.2 mg/dL (ref 8.9–10.3)
Chloride: 108 mmol/L (ref 98–111)
Creatinine: 0.89 mg/dL (ref 0.44–1.00)
GFR, Estimated: >60 mL/min (ref >60)
Glucose, Bld: 109 mg/dL — ABNORMAL HIGH (ref 70–99)
Potassium: 4.1 mmol/L (ref 3.5–5.1)
Sodium: 141 mmol/L (ref 135–145)
Total Bilirubin: 0.9 mg/dL (ref 0.3–1.2)
Total Protein: 6.2 g/dL — ABNORMAL LOW (ref 6.5–8.1)

## 2021-07-26 LAB — CBC WITH DIFFERENTIAL (CANCER CENTER ONLY)
Abs Immature Granulocytes: 0.07 10*3/uL (ref 0.00–0.07)
Basophils Absolute: 0 10*3/uL (ref 0.0–0.1)
Basophils Relative: 1 %
Eosinophils Absolute: 0 10*3/uL (ref 0.0–0.5)
Eosinophils Relative: 1 %
HCT: 31.3 % — ABNORMAL LOW (ref 36.0–46.0)
Hemoglobin: 10.5 g/dL — ABNORMAL LOW (ref 12.0–15.0)
Immature Granulocytes: 2 %
Lymphocytes Relative: 16 %
Lymphs Abs: 0.6 10*3/uL — ABNORMAL LOW (ref 0.7–4.0)
MCH: 34 pg (ref 26.0–34.0)
MCHC: 33.5 g/dL (ref 30.0–36.0)
MCV: 101.3 fL — ABNORMAL HIGH (ref 80.0–100.0)
Monocytes Absolute: 0.7 10*3/uL (ref 0.1–1.0)
Monocytes Relative: 20 %
Neutro Abs: 2.2 10*3/uL (ref 1.7–7.7)
Neutrophils Relative %: 60 %
Platelet Count: 119 10*3/uL — ABNORMAL LOW (ref 150–400)
RBC: 3.09 MIL/uL — ABNORMAL LOW (ref 3.87–5.11)
RDW: 16.1 % — ABNORMAL HIGH (ref 11.5–15.5)
WBC Count: 3.6 10*3/uL — ABNORMAL LOW (ref 4.0–10.5)
nRBC: 0 % (ref 0.0–0.2)

## 2021-07-26 MED ORDER — SODIUM CHLORIDE 0.9% FLUSH
10.0000 mL | Freq: Once | INTRAVENOUS | Status: AC
  Administered 2021-07-26: 10 mL

## 2021-07-26 MED ORDER — HEPARIN SOD (PORK) LOCK FLUSH 100 UNIT/ML IV SOLN
500.0000 [IU] | Freq: Once | INTRAVENOUS | Status: AC
  Administered 2021-07-26: 500 [IU]

## 2021-07-26 NOTE — Progress Notes (Signed)
[facility] OFFICE PROGRESS NOTE   Diagnosis: Rectal cancer  INTERVAL HISTORY:   Ms. Write returns as scheduled.  She completed another cycle of FOLFIRI 07/05/2021.  She tends to have mild nausea the night of treatment.  No vomiting.  No mouth sores.  No diarrhea.  Fairly soon after each treatment she develops a sensation of being unable to swallow adequately.  She is able to tolerate small bites/sips.  The sensation last for several days and then becomes intermittent.  Objective:  Vital signs in last 24 hours:  Blood pressure (!) 144/70, pulse 87, temperature 98.2 F (36.8 C), temperature source Oral, resp. rate 18, height 5\' 3"  (1.6 m), weight 214 lb (97.1 kg), SpO2 96 %.    HEENT: No thrush or ulcers. Resp: Lungs clear bilaterally. Cardio: Regular rate and rhythm. GI: Abdomen soft and nontender.  No hepatomegaly. Vascular: No leg edema. Skin: Palms without erythema. Port-A-Cath without erythema.   Lab Results:  Lab Results  Component Value Date   WBC 3.6 (L) 07/26/2021   HGB 10.5 (L) 07/26/2021   HCT 31.3 (L) 07/26/2021   MCV 101.3 (H) 07/26/2021   PLT 119 (L) 07/26/2021   NEUTROABS 2.2 07/26/2021    Imaging:  No results found.  Medications: I have reviewed the patient's current medications.  Assessment/Plan: Rectal cancer Mass at 7 cm from the anal verge on colonoscopy 08/09/2017, biopsy revealed invasive adenocarcinoma Staging CTs 08/17/2017-no evidence of metastatic disease, asymmetric thickening in the mid rectum MR pelvis 09/01/2017, T3N0 lesion beginning at 6.3 cm from the anal sphincter Radiation/Xeloda initiated 09/17/2017, completed 10/25/2017 Low anterior resection/diverting ileostomy 12/21/2017,ypT3,ypN1a tumor.  Lymphovascular invasion present, intact mismatch repair protein expression, treatment effect present (TRS 1).  Mismatch repair protein IHC normal; Foundation 1-KRAS/NRAS wild-type, microsatellite status and tumor mutational burden  could not be determined. Cycle 1 adjuvant Xeloda beginning 01/21/2018 Cycle 2 adjuvant Xeloda beginning 02/11/2018 Xeloda discontinued after cycle 2 secondary to patient preference Ileostomy takedown 07/10/2018 CTs 09/07/2018- multiple live small pulmonary nodules concerning for metastatic disease CT chest 12/10/2018-multiple bilateral lung nodules, some have increased in size CT chest 04/08/2019-mild enlargement of bilateral lung nodules Status post SBRT multiple lung nodules 05/06/2019, 05/08/2019, 05/13/2019 CT chest 08/28/2019-improvement and resolution in majority of right-sided pulmonary nodules, a superior segment right lower lobe nodule has increased, no new right-sided nodules.  Progressive enlargement of left-sided pulmonary nodules PET scan 09/09/2019-hypermetabolic right lower lobe nodule, 2 hypermetabolic left upper lobe nodules with an additional 0.7 cm left upper lobe nodule below PET resolution, groundglass opacity in the mid to right lower lobe with associated hypermetabolism consistent with postradiation change, hypermetabolic central segment 4A liver lesion Cycle 1 FOLFOX 10/14/2019 Cycle  2FOLFOX 11/11/2019, 5-FU and oxaliplatin dose reduced secondary to neutropenia and thrombocytopenia following cycle one, G-CSF declined Cycle 3 FOLFOX 11/26/2019 Cycle 4 FOLFOX 12/11/2019, oxaliplatin held due to neutropenia and thrombocytopenia Cycle 5 FOLFOX 12/31/2019 Cycle 6 FOLFOX 01/14/2020 (oxaliplatin held, 5-fluorouracil dose reduced due to mucositis) CT chest 01/27/2020-improvement in left lung nodules, progressive airspace disease with traction bronchiectasis throughout the right lung, previously noted right lung nodules are obscured, mildly enlarged right paratracheal lymph nodes-not hypermetabolic on prior PET, likely reactive Cycle 7 FOLFOX 01/28/2020 Cycle 8 FOLFOX 02/11/2020 (oxaliplatin held due to neutropenia and thrombocytopenia) Cycle 9 FOLFOX 02/25/2020 (oxaliplatin held due to  thrombocytopenia) Cycle 10 FOLFOX 03/11/2020 (oxaliplatin held secondary to neuropathy) Cycle 11 FOLFOX 03/25/2020 (oxaliplatin held due to neuropathy) CT chest 04/05/2020-no new or progressive findings.  Interval evolution of  presumed postradiation scarring in the right perihilar lung with decrease in the more diffuse groundglass opacity seen previously.  No substantial change in left lung nodules. 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  No definite CT findings to suggest recurrent tumor.  Stable small left upper lobe pulmonary nodules.  No new or progressive findings.  Stable small right paratracheal lymph nodes.  Stable area of irregular low-attenuation in hepatic segment 4A, site of known prior hepatic metastatic lesion.  No new or progressive findings. Cycle 18 5-fluorouracil 07/06/2020 Cycle 19 5-fluorouracil 07/20/2020 Cycle 20 5-fluorouracil 08/03/2020 Cycle 21 5-fluorouracil 08/17/2020  Cycle 22 5-fluorouracil 08/31/2020 Cycle 23 5-fluorouracil 09/14/2020 CT chest 09/24/2020-increased number and size of pulmonary nodules bilaterally.  Findings in the right upper lobe are concerning for potential lymphangitic spread of tumor.  Right upper lobe findings could also reflect evolving postradiation change. Cycle 1 FOLFIRI 10/12/2020 Cycle 2 FOLFIRI 11/08/2020, irinotecan dose reduced due to neutropenia and thrombocytopenia following cycle 1 Cycle 3 FOLFIRI 12/01/2020 Cycle 4 FOLFIRI 12/22/2020 CT chest 01/11/2021-similar bilateral pulmonary nodules.  Left upper lobe nodule has mildly decreased in size.  No new nodules. Cycle 5 FOLFIRI 01/12/2021 Cycle 6 FOLFIRI 02/03/2021 Cycle 7 FOLFIRI 03/01/2021 Cycle 8  FOLFIRI 03/22/2021 CT chest 04/08/2021-multiple small bilateral lung nodules not significantly changed.  No new nodules.  Unchanged enlarged pretracheal nodes.  Increase in fibrotic consolidation of the perihilar and inferior right lung. Cycle 9 FOLFIRI 04/12/2021 05/04/2021 treatment held due to fatigue Cycle 10 FOLFIRI 05/18/2021 06/07/2021-treatment held secondary to neutropenia Cycle 11 FOLFIRI 06/14/2021 Cycle 12 FOLFIRI 07/05/2021 Cycle 13 FOLFIRI 07/27/2021, irinotecan held     2.   Hypothyroid   3.    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  Port-A-Cath placement 09/29/2019, interventional radiology   5.  Neutropenia and thrombocytopenia following cycle 1 FOLFOX-plan chemotherapy dose reductions, she declined G-CSF   6.  Mucositis following cycle 5 FOLFOX, 5-fluorouracil dose reduced with cycle 6 Mucositis following cycle 22 5-fluorouracil-Magic mouthwash added   7.  Right lung airspace disease/volume loss-likely toxicity from chest radiation, trial of prednisone 01/29/2020; dyspnea and cough improved 02/11/2020 Progressive cough 03/11/2020-prednisone resumed at a dose of 20 mg daily Cough and dyspnea improved 03/25/2020-prednisone taper to 15 mg daily Improved 04/06/2020-prednisone taper to 10 mg daily Stable 04/21/2020-prednisone taper to 5 mg daily    Disposition: Ms. Brose appears stable.  She has completed 12 cycles of FOLFIRI.  Plan to proceed with cycle 13 as scheduled on 07/27/2021.  We decided to hold the irinotecan to see if this impacts the sensation of impaired swallowing that she is experiencing after each treatment.  She agrees with this plan.  CBC and chemistry panel reviewed.  Labs adequate to proceed as above.  She will return for lab, follow-up, FOLFIRI in 3 weeks.  She will contact the office in the interim with any problems.  Plan reviewed with Dr. Truett Perna.    Lonna Cobb ANP/GNP-BC   07/26/2021  2:24 PM

## 2021-07-27 ENCOUNTER — Inpatient Hospital Stay: Payer: Medicare Other

## 2021-07-27 VITALS — BP 114/50 | HR 74 | Temp 98.8°F | Resp 20

## 2021-07-27 DIAGNOSIS — Z5111 Encounter for antineoplastic chemotherapy: Secondary | ICD-10-CM | POA: Diagnosis not present

## 2021-07-27 DIAGNOSIS — C20 Malignant neoplasm of rectum: Secondary | ICD-10-CM

## 2021-07-27 MED ORDER — FLUOROURACIL CHEMO INJECTION 2.5 GM/50ML
300.0000 mg/m2 | Freq: Once | INTRAVENOUS | Status: AC
  Administered 2021-07-27: 600 mg via INTRAVENOUS
  Filled 2021-07-27: qty 12

## 2021-07-27 MED ORDER — SODIUM CHLORIDE 0.9 % IV SOLN
1800.0000 mg/m2 | INTRAVENOUS | Status: DC
  Administered 2021-07-27: 3750 mg via INTRAVENOUS
  Filled 2021-07-27: qty 75

## 2021-07-27 MED ORDER — SODIUM CHLORIDE 0.9 % IV SOLN: Freq: Once | INTRAVENOUS | Status: AC

## 2021-07-27 MED ORDER — SODIUM CHLORIDE 0.9 % IV SOLN
300.0000 mg/m2 | Freq: Once | INTRAVENOUS | Status: AC
  Administered 2021-07-27: 622 mg via INTRAVENOUS
  Filled 2021-07-27: qty 31.1

## 2021-07-27 MED ORDER — SODIUM CHLORIDE 0.9 % IV SOLN
300.0000 mg/m2 | Freq: Once | INTRAVENOUS | Status: DC
  Filled 2021-07-27: qty 31.1

## 2021-07-27 NOTE — Patient Instructions (Signed)
[facility]   Discharge Instructions: Thank you for choosing [facility] to provide your oncology and hematology care.   If you have a lab appointment with the [facility], please go directly to the [facility] and check in at the registration area.   Wear comfortable clothing and clothing appropriate for easy access to any Portacath or PICC line.   We strive to give you quality time with your provider. You may need to reschedule your appointment if you arrive late (15 or more minutes).  Arriving late affects you and other patients whose appointments are after yours.  Also, if you miss three or more appointments without notifying the office, you may be dismissed from the clinic at the provider's discretion.      For prescription refill requests, have your pharmacy contact our office and allow 72 hours for refills to be completed.    Today you received the following chemotherapy and/or immunotherapy agents Leucovorin & Flourouracil (ADRUCIL).      To help prevent nausea and vomiting after your treatment, we encourage you to take your nausea medication as directed.  BELOW ARE SYMPTOMS THAT SHOULD BE REPORTED IMMEDIATELY: *FEVER GREATER THAN 100.4 F (38 C) OR HIGHER *CHILLS OR SWEATING *NAUSEA AND VOMITING THAT IS NOT CONTROLLED WITH YOUR NAUSEA MEDICATION *UNUSUAL SHORTNESS OF BREATH *UNUSUAL BRUISING OR BLEEDING *URINARY PROBLEMS (pain or burning when urinating, or frequent urination) *BOWEL PROBLEMS (unusual diarrhea, constipation, pain near the anus) TENDERNESS IN MOUTH AND THROAT WITH OR WITHOUT PRESENCE OF ULCERS (sore throat, sores in mouth, or a toothache) UNUSUAL RASH, SWELLING OR PAIN  UNUSUAL VAGINAL DISCHARGE OR ITCHING   Items with * indicate a potential emergency and should be followed up as soon as possible or go to the Emergency Department if any problems should occur.  Please show the CHEMOTHERAPY ALERT CARD or IMMUNOTHERAPY  ALERT CARD at check-in to the Emergency Department and triage nurse.  Should you have questions after your visit or need to cancel or reschedule your appointment, please contact [facility]  Dept: [phone]  and follow the prompts.  Office hours are 8:00 a.m. to 4:30 p.m. Monday - Friday. Please note that voicemails left after 4:00 p.m. may not be returned until the following business day.  We are closed weekends and major holidays. You have access to a nurse at all times for urgent questions. Please call the main number to the clinic Dept: [phone] and follow the prompts.   For any non-urgent questions, you may also contact your provider using MyChart. We now offer e-Visits for anyone 107 and older to request care online for non-urgent symptoms. For details visit [email or web address].   Also download the MyChart app! Go to the app store, search "MyChart", open the app, select [facility], and log in with your MyChart username and password.  Masks are optional in the cancer centers. If you would like for your care team to wear a mask while they are taking care of you, please let them know. For doctor visits, patients may have with them one support person who is at least 73 years old. At this time, visitors are not allowed in the infusion area.  Leucovorin injection What is this medication? LEUCOVORIN (loo koe VOR in) is used to prevent or treat the harmful effects of some medicines. This medicine is used to treat anemia caused by a low amount of folic acid in the body. It is also used with  5-fluorouracil (5-FU) to treat colon cancer. This medicine may be used for other purposes; ask your health care provider or pharmacist if you have questions. What should I tell my care team before I take this medication? They need to know if you have any of these conditions: anemia from low levels of vitamin B-12 in the blood an unusual or allergic reaction to leucovorin, folic  acid, other medicines, foods, dyes, or preservatives pregnant or trying to get pregnant breast-feeding How should I use this medication? This medicine is for injection into a muscle or into a vein. It is given by a health care professional in a hospital or clinic setting. Talk to your pediatrician regarding the use of this medicine in children. Special care may be needed. Overdosage: If you think you have taken too much of this medicine contact a poison control center or emergency room at once. NOTE: This medicine is only for you. Do not share this medicine with others. What if I miss a dose? This does not apply. What may interact with this medication? capecitabine fluorouracil phenobarbital phenytoin primidone trimethoprim-sulfamethoxazole This list may not describe all possible interactions. Give your health care provider a list of all the medicines, herbs, non-prescription drugs, or dietary supplements you use. Also tell them if you smoke, drink alcohol, or use illegal drugs. Some items may interact with your medicine. What should I watch for while using this medication? Your condition will be monitored carefully while you are receiving this medicine. This medicine may increase the side effects of 5-fluorouracil, 5-FU. Tell your doctor or health care professional if you have diarrhea or mouth sores that do not get better or that get worse. What side effects may I notice from receiving this medication? Side effects that you should report to your doctor or health care professional as soon as possible: allergic reactions like skin rash, itching or hives, swelling of the face, lips, or tongue breathing problems fever, infection mouth sores unusual bleeding or bruising unusually weak or tired Side effects that usually do not require medical attention (report to your doctor or health care professional if they continue or are bothersome): constipation or diarrhea loss of appetite nausea,  vomiting This list may not describe all possible side effects. Call your doctor for medical advice about side effects. You may report side effects to FDA at 1-800-FDA-1088. Where should I keep my medication? This drug is given in a hospital or clinic and will not be stored at home. NOTE: This sheet is a summary. It may not cover all possible information. If you have questions about this medicine, talk to your doctor, pharmacist, or health care provider.  2023 Elsevier/Gold Standard (2007-07-25 00:00:00) Fluorouracil, 5-FU injection What is this medication? FLUOROURACIL, 5-FU (flure oh YOOR a sil) is a chemotherapy drug. It slows the growth of cancer cells. This medicine is used to treat many types of cancer like breast cancer, colon or rectal cancer, pancreatic cancer, and stomach cancer. This medicine may be used for other purposes; ask your health care provider or pharmacist if you have questions. COMMON BRAND NAME(S): Adrucil What should I tell my care team before I take this medication? They need to know if you have any of these conditions: blood disorders dihydropyrimidine dehydrogenase (DPD) deficiency infection (especially a virus infection such as chickenpox, cold sores, or herpes) kidney disease liver disease malnourished, poor nutrition recent or ongoing radiation therapy an unusual or allergic reaction to fluorouracil, other chemotherapy, other medicines, foods, dyes, or preservatives pregnant or  trying to get pregnant breast-feeding How should I use this medication? This drug is given as an infusion or injection into a vein. It is administered in a hospital or clinic by a specially trained health care professional. Talk to your pediatrician regarding the use of this medicine in children. Special care may be needed. Overdosage: If you think you have taken too much of this medicine contact a poison control center or emergency room at once. NOTE: This medicine is only for you. Do  not share this medicine with others. What if I miss a dose? It is important not to miss your dose. Call your doctor or health care professional if you are unable to keep an appointment. What may interact with this medication? Do not take this medicine with any of the following medications: live virus vaccines This medicine may also interact with the following medications: medicines that treat or prevent blood clots like warfarin, enoxaparin, and dalteparin This list may not describe all possible interactions. Give your health care provider a list of all the medicines, herbs, non-prescription drugs, or dietary supplements you use. Also tell them if you smoke, drink alcohol, or use illegal drugs. Some items may interact with your medicine. What should I watch for while using this medication? Visit your doctor for checks on your progress. This drug may make you feel generally unwell. This is not uncommon, as chemotherapy can affect healthy cells as well as cancer cells. Report any side effects. Continue your course of treatment even though you feel ill unless your doctor tells you to stop. In some cases, you may be given additional medicines to help with side effects. Follow all directions for their use. Call your doctor or health care professional for advice if you get a fever, chills or sore throat, or other symptoms of a cold or flu. Do not treat yourself. This drug decreases your body's ability to fight infections. Try to avoid being around people who are sick. This medicine may increase your risk to bruise or bleed. Call your doctor or health care professional if you notice any unusual bleeding. Be careful brushing and flossing your teeth or using a toothpick because you may get an infection or bleed more easily. If you have any dental work done, tell your dentist you are receiving this medicine. Avoid taking products that contain aspirin, acetaminophen, ibuprofen, naproxen, or ketoprofen unless  instructed by your doctor. These medicines may hide a fever. Do not become pregnant while taking this medicine. Women should inform their doctor if they wish to become pregnant or think they might be pregnant. There is a potential for serious side effects to an unborn child. Talk to your health care professional or pharmacist for more information. Do not breast-feed an infant while taking this medicine. Men should inform their doctor if they wish to father a child. This medicine may lower sperm counts. Do not treat diarrhea with over the counter products. Contact your doctor if you have diarrhea that lasts more than 2 days or if it is severe and watery. This medicine can make you more sensitive to the sun. Keep out of the sun. If you cannot avoid being in the sun, wear protective clothing and use sunscreen. Do not use sun lamps or tanning beds/booths. What side effects may I notice from receiving this medication? Side effects that you should report to your doctor or health care professional as soon as possible: allergic reactions like skin rash, itching or hives, swelling of the face, lips,  or tongue low blood counts - this medicine may decrease the number of white blood cells, red blood cells and platelets. You may be at increased risk for infections and bleeding. signs of infection - fever or chills, cough, sore throat, pain or difficulty passing urine signs of decreased platelets or bleeding - bruising, pinpoint red spots on the skin, black, tarry stools, blood in the urine signs of decreased red blood cells - unusually weak or tired, fainting spells, lightheadedness breathing problems changes in vision chest pain mouth sores nausea and vomiting pain, swelling, redness at site where injected pain, tingling, numbness in the hands or feet redness, swelling, or sores on hands or feet stomach pain unusual bleeding Side effects that usually do not require medical attention (report to your doctor  or health care professional if they continue or are bothersome): changes in finger or toe nails diarrhea dry or itchy skin hair loss headache loss of appetite sensitivity of eyes to the light stomach upset unusually teary eyes This list may not describe all possible side effects. Call your doctor for medical advice about side effects. You may report side effects to FDA at 1-800-FDA-1088. Where should I keep my medication? This drug is given in a hospital or clinic and will not be stored at home. NOTE: This sheet is a summary. It may not cover all possible information. If you have questions about this medicine, talk to your doctor, pharmacist, or health care provider.  2023 Elsevier/Gold Standard (2020-12-17 00:00:00) The chemotherapy medication bag should finish at 46 hours, 96 hours, or 7 days. For example, if your pump is scheduled for 46 hours and it was put on at 4:00 p.m., it should finish at 2:00 p.m. the day it is scheduled to come off regardless of your appointment time.     Estimated time to finish at 11:30 a.m. on Friday 07/29/2021.   If the display on your pump reads "Low Volume" and it is beeping, take the batteries out of the pump and come to the cancer center for it to be taken off.   If the pump alarms go off prior to the pump reading "Low Volume" then call [phone] and someone can assist you.  If the plunger comes out and the chemotherapy medication is leaking out, please use your home chemo spill kit to clean up the spill. Do NOT use paper towels or other household products.  If you have problems or questions regarding your pump, please call either [phone] (24 hours a day) or the cancer center Monday-Friday 8:00 a.m.- 4:30 p.m. at the clinic number and we will assist you. If you are unable to get assistance, then go to the nearest Emergency Department and ask the staff to contact the IV team for assistance.

## 2021-07-29 ENCOUNTER — Inpatient Hospital Stay: Payer: Medicare Other

## 2021-07-29 VITALS — BP 148/65 | HR 85 | Temp 98.2°F | Resp 20

## 2021-07-29 DIAGNOSIS — Z5111 Encounter for antineoplastic chemotherapy: Secondary | ICD-10-CM | POA: Diagnosis not present

## 2021-07-29 DIAGNOSIS — C20 Malignant neoplasm of rectum: Secondary | ICD-10-CM

## 2021-07-29 MED ORDER — HEPARIN SOD (PORK) LOCK FLUSH 100 UNIT/ML IV SOLN
500.0000 [IU] | Freq: Once | INTRAVENOUS | Status: AC | PRN
  Administered 2021-07-29: 500 [IU]

## 2021-07-29 MED ORDER — SODIUM CHLORIDE 0.9% FLUSH
10.0000 mL | INTRAVENOUS | Status: DC | PRN
  Administered 2021-07-29: 10 mL

## 2021-07-29 NOTE — Patient Instructions (Signed)

## 2021-08-14 ENCOUNTER — Other Ambulatory Visit: Payer: Self-pay | Admitting: Oncology

## 2021-08-16 ENCOUNTER — Inpatient Hospital Stay: Payer: Medicare Other

## 2021-08-16 ENCOUNTER — Inpatient Hospital Stay: Payer: Medicare Other | Attending: Oncology | Admitting: Oncology

## 2021-08-16 ENCOUNTER — Encounter: Payer: Self-pay | Admitting: *Deleted

## 2021-08-16 VITALS — BP 143/63 | HR 80

## 2021-08-16 VITALS — BP 139/78 | HR 89 | Temp 98.2°F | Resp 18 | Ht 63.0 in | Wt 215.2 lb

## 2021-08-16 DIAGNOSIS — K1230 Oral mucositis (ulcerative), unspecified: Secondary | ICD-10-CM | POA: Insufficient documentation

## 2021-08-16 DIAGNOSIS — D696 Thrombocytopenia, unspecified: Secondary | ICD-10-CM | POA: Diagnosis not present

## 2021-08-16 DIAGNOSIS — C787 Secondary malignant neoplasm of liver and intrahepatic bile duct: Secondary | ICD-10-CM | POA: Diagnosis not present

## 2021-08-16 DIAGNOSIS — C20 Malignant neoplasm of rectum: Secondary | ICD-10-CM | POA: Insufficient documentation

## 2021-08-16 DIAGNOSIS — R918 Other nonspecific abnormal finding of lung field: Secondary | ICD-10-CM | POA: Insufficient documentation

## 2021-08-16 DIAGNOSIS — Z5111 Encounter for antineoplastic chemotherapy: Secondary | ICD-10-CM | POA: Insufficient documentation

## 2021-08-16 LAB — CBC WITH DIFFERENTIAL (CANCER CENTER ONLY)
Abs Immature Granulocytes: 0.02 10*3/uL (ref 0.00–0.07)
Basophils Absolute: 0 10*3/uL (ref 0.0–0.1)
Basophils Relative: 0 %
Eosinophils Absolute: 0.3 10*3/uL (ref 0.0–0.5)
Eosinophils Relative: 7 %
HCT: 31.6 % — ABNORMAL LOW (ref 36.0–46.0)
Hemoglobin: 10.9 g/dL — ABNORMAL LOW (ref 12.0–15.0)
Immature Granulocytes: 1 %
Lymphocytes Relative: 19 %
Lymphs Abs: 0.7 10*3/uL (ref 0.7–4.0)
MCH: 35.2 pg — ABNORMAL HIGH (ref 26.0–34.0)
MCHC: 34.5 g/dL (ref 30.0–36.0)
MCV: 101.9 fL — ABNORMAL HIGH (ref 80.0–100.0)
Monocytes Absolute: 0.7 10*3/uL (ref 0.1–1.0)
Monocytes Relative: 18 %
Neutro Abs: 2 10*3/uL (ref 1.7–7.7)
Neutrophils Relative %: 55 %
Platelet Count: 115 10*3/uL — ABNORMAL LOW (ref 150–400)
RBC: 3.1 MIL/uL — ABNORMAL LOW (ref 3.87–5.11)
RDW: 16 % — ABNORMAL HIGH (ref 11.5–15.5)
WBC Count: 3.7 10*3/uL — ABNORMAL LOW (ref 4.0–10.5)
nRBC: 0 % (ref 0.0–0.2)

## 2021-08-16 LAB — CMP (CANCER CENTER ONLY)
ALT: 26 U/L (ref 0–44)
AST: 29 U/L (ref 15–41)
Albumin: 3.7 g/dL (ref 3.5–5.0)
Alkaline Phosphatase: 51 U/L (ref 38–126)
Anion gap: 11 (ref 5–15)
BUN: 14 mg/dL (ref 8–23)
CO2: 24 mmol/L (ref 22–32)
Calcium: 9 mg/dL (ref 8.9–10.3)
Chloride: 108 mmol/L (ref 98–111)
Creatinine: 0.84 mg/dL (ref 0.44–1.00)
GFR, Estimated: >60 mL/min (ref >60)
Glucose, Bld: 138 mg/dL — ABNORMAL HIGH (ref 70–99)
Potassium: 3.4 mmol/L — ABNORMAL LOW (ref 3.5–5.1)
Sodium: 143 mmol/L (ref 135–145)
Total Bilirubin: 0.9 mg/dL (ref 0.3–1.2)
Total Protein: 5.7 g/dL — ABNORMAL LOW (ref 6.5–8.1)

## 2021-08-16 MED ORDER — FLUOROURACIL CHEMO INJECTION 2.5 GM/50ML
300.0000 mg/m2 | Freq: Once | INTRAVENOUS | Status: AC
  Administered 2021-08-16: 600 mg via INTRAVENOUS
  Filled 2021-08-16: qty 12

## 2021-08-16 MED ORDER — SODIUM CHLORIDE 0.9 % IV SOLN
300.0000 mg/m2 | Freq: Once | INTRAVENOUS | Status: AC
  Administered 2021-08-16: 622 mg via INTRAVENOUS
  Filled 2021-08-16: qty 31.1

## 2021-08-16 MED ORDER — SODIUM CHLORIDE 0.9 % IV SOLN: Freq: Once | INTRAVENOUS | Status: AC

## 2021-08-16 MED ORDER — SODIUM CHLORIDE 0.9 % IV SOLN
1800.0000 mg/m2 | INTRAVENOUS | Status: DC
  Administered 2021-08-16: 3750 mg via INTRAVENOUS
  Filled 2021-08-16: qty 75

## 2021-08-16 NOTE — Progress Notes (Signed)
[facility] OFFICE PROGRESS NOTE   Diagnosis: Rectal cancer  INTERVAL HISTORY:   Samantha Lutz returns as scheduled.  She completed a cycle of 5-fluorouracil 07/27/2021.  She did not have throat or chest tightness following this cycle of chemotherapy.  She has exertional dyspnea.  No new complaint.  No mouth sores or diarrhea.  Objective:  Vital signs in last 24 hours:  Blood pressure 139/78, pulse 89, temperature 98.2 F (36.8 C), temperature source Oral, resp. rate 18, height _0  (1.6 m), weight 215 lb 3.2 oz (97.6 kg), SpO2 96 %.    HEENT: No thrush or ulcers Resp: Decreased breath sounds at the right lower chest, no respiratory distress Cardio: Regular rate and rhythm GI: No hepatosplenomegaly Vascular: No leg edema    Portacath/PICC-without erythema  Lab Results:  Lab Results  Component Value Date   WBC 3.7 (L) 08/16/2021   HGB 10.9 (L) 08/16/2021   HCT 31.6 (L) 08/16/2021   MCV 101.9 (H) 08/16/2021   PLT 115 (L) 08/16/2021   NEUTROABS 2.0 08/16/2021    CMP  Lab Results  Component Value Date   NA 143 08/16/2021   K 3.4 (L) 08/16/2021   CL 108 08/16/2021   CO2 24 08/16/2021   GLUCOSE 138 (H) 08/16/2021   BUN 14 08/16/2021   CREATININE 0.84 08/16/2021   CALCIUM 9.0 08/16/2021   PROT 5.7 (L) 08/16/2021   ALBUMIN 3.7 08/16/2021   AST 29 08/16/2021   ALT 26 08/16/2021   ALKPHOS 51 08/16/2021   BILITOT 0.9 08/16/2021   GFRNONAA >60 08/16/2021   GFRAA >60 10/28/2019    Lab Results  Component Value Date   CEA1 <1.00 06/01/2020   CEA <1.00 06/01/2020     Medications: I have reviewed the patient's current medications.   Assessment/Plan: Rectal cancer Mass at 7 cm from the anal verge on colonoscopy 08/09/2017, biopsy revealed invasive adenocarcinoma Staging CTs 08/17/2017-no evidence of metastatic disease, asymmetric thickening in the mid rectum MR pelvis 09/01/2017, T3N0 lesion beginning at 6.3 cm from the anal  sphincter Radiation/Xeloda initiated 09/17/2017, completed 10/25/2017 Low anterior resection/diverting ileostomy 12/21/2017,ypT3,ypN1a tumor.  Lymphovascular invasion present, intact mismatch repair protein expression, treatment effect present (TRS 1).  Mismatch repair protein IHC normal; Foundation 1-KRAS/NRAS wild-type, microsatellite status and tumor mutational burden could not be determined. Cycle 1 adjuvant Xeloda beginning 01/21/2018 Cycle 2 adjuvant Xeloda beginning 02/11/2018 Xeloda discontinued after cycle 2 secondary to patient preference Ileostomy takedown 07/10/2018 CTs 09/07/2018- multiple live small pulmonary nodules concerning for metastatic disease CT chest 12/10/2018-multiple bilateral lung nodules, some have increased in size CT chest 04/08/2019-mild enlargement of bilateral lung nodules Status post SBRT multiple lung nodules 05/06/2019, 05/08/2019, 05/13/2019 CT chest 08/28/2019-improvement and resolution in majority of right-sided pulmonary nodules, a superior segment right lower lobe nodule has increased, no new right-sided nodules.  Progressive enlargement of left-sided pulmonary nodules PET scan 09/09/2019-hypermetabolic right lower lobe nodule, 2 hypermetabolic left upper lobe nodules with an additional 0.7 cm left upper lobe nodule below PET resolution, groundglass opacity in the mid to right lower lobe with associated hypermetabolism consistent with postradiation change, hypermetabolic central segment 4A liver lesion Cycle 1 FOLFOX 10/14/2019 Cycle  2FOLFOX 11/11/2019, 5-FU and oxaliplatin dose reduced secondary to neutropenia and thrombocytopenia following cycle one, G-CSF declined Cycle 3 FOLFOX 11/26/2019 Cycle 4 FOLFOX 12/11/2019, oxaliplatin held due to neutropenia and thrombocytopenia Cycle 5 FOLFOX 12/31/2019 Cycle 6 FOLFOX 01/14/2020 (oxaliplatin held, 5-fluorouracil dose reduced due to mucositis) CT chest 01/27/2020-improvement in left lung nodules,  progressive airspace disease  with traction bronchiectasis throughout the right lung, previously noted right lung nodules are obscured, mildly enlarged right paratracheal lymph nodes-not hypermetabolic on prior PET, likely reactive Cycle 7 FOLFOX 01/28/2020 Cycle 8 FOLFOX 02/11/2020 (oxaliplatin held due to neutropenia and thrombocytopenia) Cycle 9 FOLFOX 02/25/2020 (oxaliplatin held due to thrombocytopenia) Cycle 10 FOLFOX 03/11/2020 (oxaliplatin held secondary to neuropathy) Cycle 11 FOLFOX 03/25/2020 (oxaliplatin held due to neuropathy) CT chest 04/05/2020-no new or progressive findings.  Interval evolution of presumed postradiation scarring in the right perihilar lung with decrease in the more diffuse groundglass opacity seen previously.  No substantial change in left lung nodules. 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  No definite CT findings to suggest recurrent tumor.  Stable small left upper lobe pulmonary nodules.  No new or progressive findings.  Stable small right paratracheal lymph nodes.  Stable area of irregular low-attenuation in hepatic segment 4A, site of known prior hepatic metastatic lesion.  No new or progressive findings. Cycle 18 5-fluorouracil 07/06/2020 Cycle 19 5-fluorouracil 07/20/2020 Cycle 20 5-fluorouracil 08/03/2020 Cycle 21 5-fluorouracil 08/17/2020  Cycle 22 5-fluorouracil 08/31/2020 Cycle 23 5-fluorouracil 09/14/2020 CT chest 09/24/2020-increased number and size of pulmonary nodules bilaterally.  Findings in the right upper lobe are concerning for potential lymphangitic spread of tumor.  Right upper lobe findings could also reflect evolving postradiation change. Cycle 1 FOLFIRI 10/12/2020 Cycle 2 FOLFIRI  11/08/2020, irinotecan dose reduced due to neutropenia and thrombocytopenia following cycle 1 Cycle 3 FOLFIRI 12/01/2020 Cycle 4 FOLFIRI 12/22/2020 CT chest 01/11/2021-similar bilateral pulmonary nodules.  Left upper lobe nodule has mildly decreased in size.  No new nodules. Cycle 5 FOLFIRI 01/12/2021 Cycle 6 FOLFIRI 02/03/2021 Cycle 7 FOLFIRI 03/01/2021 Cycle 8 FOLFIRI 03/22/2021 CT chest 04/08/2021-multiple small bilateral lung nodules not significantly changed.  No new nodules.  Unchanged enlarged pretracheal nodes.  Increase in fibrotic consolidation of the perihilar and inferior right lung. Cycle 9 FOLFIRI 04/12/2021 05/04/2021 treatment held due to fatigue Cycle 10 FOLFIRI 05/18/2021 06/07/2021-treatment held secondary to neutropenia Cycle 11 FOLFIRI 06/14/2021 Cycle 12 FOLFIRI 07/05/2021 Cycle 13 FOLFIRI 07/27/2021, irinotecan held 14 FOLFIRI 08/16/2021, irinotecan held     2.   Hypothyroid   3.    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  Port-A-Cath placement 09/29/2019, interventional radiology   5.  Neutropenia and thrombocytopenia following cycle 1 FOLFOX-plan chemotherapy dose reductions, she declined G-CSF   6.  Mucositis following cycle 5 FOLFOX, 5-fluorouracil dose reduced with cycle 6 Mucositis following cycle 22 5-fluorouracil-Magic mouthwash added   7.  Right lung airspace disease/volume loss-likely toxicity from chest radiation, trial of prednisone 01/29/2020; dyspnea and cough improved 02/11/2020 Progressive cough 03/11/2020-prednisone resumed at a dose of 20 mg daily Cough and dyspnea improved 03/25/2020-prednisone taper to 15 mg daily Improved 04/06/2020-prednisone taper to 10 mg daily Stable 04/21/2020-prednisone taper to 5 mg daily      Disposition: Ms. Chalupa appears stable.  She did not have posttreatment dysphagia/throat and chest tightness when irinotecan was taken out of the chemotherapy regimen 07/27/2021.  Irinotecan will remain  out of the treatment regimen again today.  She will undergo a restaging chest CT prior to an office visit in 3 weeks.  Betsy Coder, MD  08/16/2021  11:43 AM

## 2021-08-16 NOTE — Patient Instructions (Addendum)
[facility]  The chemotherapy medication bag should finish at 46 hours, 96 hours, or 7 days. For example, if your pump is scheduled for 46 hours and it was put on at 4:00 p.m., it should finish at 2:00 p.m. the day it is scheduled to come off regardless of your appointment time.     Estimated time to finish at 11:30am Thursday, August 18, 2021.   If the display on your pump reads "Low Volume" and it is beeping, take the batteries out of the pump and come to the cancer center for it to be taken off.   If the pump alarms go off prior to the pump reading "Low Volume" then call [phone] and someone can assist you.  If the plunger comes out and the chemotherapy medication is leaking out, please use your home chemo spill kit to clean up the spill. Do NOT use paper towels or other household products.  If you have problems or questions regarding your pump, please call either [phone] (24 hours a day) or the cancer center Monday-Friday 8:00 a.m.- 4:30 p.m. at the clinic number and we will assist you. If you are unable to get assistance, then go to the nearest Emergency Department and ask the staff to contact the IV team for assistance.   Discharge Instructions: Thank you for choosing [facility] to provide your oncology and hematology care.   If you have a lab appointment with the [facility], please go directly to the [facility] and check in at the registration area.   Wear comfortable clothing and clothing appropriate for easy access to any Portacath or PICC line.   We strive to give you quality time with your provider. You may need to reschedule your appointment if you arrive late (15 or more minutes).  Arriving late affects you and other patients whose appointments are after yours.  Also, if you miss three or more appointments without notifying the office, you may be dismissed from the clinic at the provider's discretion.      For prescription  refill requests, have your pharmacy contact our office and allow 72 hours for refills to be completed.    Today you received the following chemotherapy and/or immunotherapy agents Irinotecan, Leucovorin, Fluorouracil.      To help prevent nausea and vomiting after your treatment, we encourage you to take your nausea medication as directed.  BELOW ARE SYMPTOMS THAT SHOULD BE REPORTED IMMEDIATELY: *FEVER GREATER THAN 100.4 F (38 C) OR HIGHER *CHILLS OR SWEATING *NAUSEA AND VOMITING THAT IS NOT CONTROLLED WITH YOUR NAUSEA MEDICATION *UNUSUAL SHORTNESS OF BREATH *UNUSUAL BRUISING OR BLEEDING *URINARY PROBLEMS (pain or burning when urinating, or frequent urination) *BOWEL PROBLEMS (unusual diarrhea, constipation, pain near the anus) TENDERNESS IN MOUTH AND THROAT WITH OR WITHOUT PRESENCE OF ULCERS (sore throat, sores in mouth, or a toothache) UNUSUAL RASH, SWELLING OR PAIN  UNUSUAL VAGINAL DISCHARGE OR ITCHING   Items with * indicate a potential emergency and should be followed up as soon as possible or go to the Emergency Department if any problems should occur.  Please show the CHEMOTHERAPY ALERT CARD or IMMUNOTHERAPY ALERT CARD at check-in to the Emergency Department and triage nurse.  Should you have questions after your visit or need to cancel or reschedule your appointment, please contact [facility]  Dept: [phone]  and follow the prompts.  Office hours are 8:00 a.m. to 4:30 p.m. Monday - Friday. Please note that voicemails  left after 4:00 p.m. may not be returned until the following business day.  We are closed weekends and major holidays. You have access to a nurse at all times for urgent questions. Please call the main number to the clinic Dept: [phone] and follow the prompts.   For any non-urgent questions, you may also contact your provider using MyChart. We now offer e-Visits for anyone 13 and older to request care online for non-urgent symptoms.  For details visit [email or web address].   Also download the MyChart app! Go to the app store, search "MyChart", open the app, select [facility], and log in with your MyChart username and password.  Due to Covid, a mask is required upon entering the hospital/clinic. If you do not have a mask, one will be given to you upon arrival. For doctor visits, patients may have 1 support person aged 66 or older with them. For treatment visits, patients cannot have anyone with them due to current Covid guidelines and our immunocompromised population.   Leucovorin injection What is this medication? LEUCOVORIN (loo koe VOR in) is used to prevent or treat the harmful effects of some medicines. This medicine is used to treat anemia caused by a low amount of folic acid in the body. It is also used with 5-fluorouracil (5-FU) to treat colon cancer. This medicine may be used for other purposes; ask your health care provider or pharmacist if you have questions. What should I tell my care team before I take this medication? They need to know if you have any of these conditions: anemia from low levels of vitamin B-12 in the blood an unusual or allergic reaction to leucovorin, folic acid, other medicines, foods, dyes, or preservatives pregnant or trying to get pregnant breast-feeding How should I use this medication? This medicine is for injection into a muscle or into a vein. It is given by a health care professional in a hospital or clinic setting. Talk to your pediatrician regarding the use of this medicine in children. Special care may be needed. Overdosage: If you think you have taken too much of this medicine contact a poison control center or emergency room at once. NOTE: This medicine is only for you. Do not share this medicine with others. What if I miss a dose? This does not apply. What may interact with this  medication? capecitabine fluorouracil phenobarbital phenytoin primidone trimethoprim-sulfamethoxazole This list may not describe all possible interactions. Give your health care provider a list of all the medicines, herbs, non-prescription drugs, or dietary supplements you use. Also tell them if you smoke, drink alcohol, or use illegal drugs. Some items may interact with your medicine. What should I watch for while using this medication? Your condition will be monitored carefully while you are receiving this medicine. This medicine may increase the side effects of 5-fluorouracil, 5-FU. Tell your doctor or health care professional if you have diarrhea or mouth sores that do not get better or that get worse. What side effects may I notice from receiving this medication? Side effects that you should report to your doctor or health care professional as soon as possible: allergic reactions like skin rash, itching or hives, swelling of the face, lips, or tongue breathing problems fever, infection mouth sores unusual bleeding or bruising unusually weak or tired Side effects that usually do not require medical attention (report to your doctor or health care professional if they continue or are bothersome): constipation or diarrhea loss of appetite nausea, vomiting This list may not describe all  possible side effects. Call your doctor for medical advice about side effects. You may report side effects to FDA at 1-800-FDA-1088. Where should I keep my medication? This drug is given in a hospital or clinic and will not be stored at home. NOTE: This sheet is a summary. It may not cover all possible information. If you have questions about this medicine, talk to your doctor, pharmacist, or health care provider.  2023 Elsevier/Gold Standard (2007-07-25 00:00:00)  Fluorouracil, 5-FU injection What is this medication? FLUOROURACIL, 5-FU (flure oh YOOR a sil) is a chemotherapy drug. It slows the growth  of cancer cells. This medicine is used to treat many types of cancer like breast cancer, colon or rectal cancer, pancreatic cancer, and stomach cancer. This medicine may be used for other purposes; ask your health care provider or pharmacist if you have questions. COMMON BRAND NAME(S): Adrucil What should I tell my care team before I take this medication? They need to know if you have any of these conditions: blood disorders dihydropyrimidine dehydrogenase (DPD) deficiency infection (especially a virus infection such as chickenpox, cold sores, or herpes) kidney disease liver disease malnourished, poor nutrition recent or ongoing radiation therapy an unusual or allergic reaction to fluorouracil, other chemotherapy, other medicines, foods, dyes, or preservatives pregnant or trying to get pregnant breast-feeding How should I use this medication? This drug is given as an infusion or injection into a vein. It is administered in a hospital or clinic by a specially trained health care professional. Talk to your pediatrician regarding the use of this medicine in children. Special care may be needed. Overdosage: If you think you have taken too much of this medicine contact a poison control center or emergency room at once. NOTE: This medicine is only for you. Do not share this medicine with others. What if I miss a dose? It is important not to miss your dose. Call your doctor or health care professional if you are unable to keep an appointment. What may interact with this medication? Do not take this medicine with any of the following medications: live virus vaccines This medicine may also interact with the following medications: medicines that treat or prevent blood clots like warfarin, enoxaparin, and dalteparin This list may not describe all possible interactions. Give your health care provider a list of all the medicines, herbs, non-prescription drugs, or dietary supplements you use. Also tell  them if you smoke, drink alcohol, or use illegal drugs. Some items may interact with your medicine. What should I watch for while using this medication? Visit your doctor for checks on your progress. This drug may make you feel generally unwell. This is not uncommon, as chemotherapy can affect healthy cells as well as cancer cells. Report any side effects. Continue your course of treatment even though you feel ill unless your doctor tells you to stop. In some cases, you may be given additional medicines to help with side effects. Follow all directions for their use. Call your doctor or health care professional for advice if you get a fever, chills or sore throat, or other symptoms of a cold or flu. Do not treat yourself. This drug decreases your body's ability to fight infections. Try to avoid being around people who are sick. This medicine may increase your risk to bruise or bleed. Call your doctor or health care professional if you notice any unusual bleeding. Be careful brushing and flossing your teeth or using a toothpick because you may get an infection or bleed more  easily. If you have any dental work done, tell your dentist you are receiving this medicine. Avoid taking products that contain aspirin, acetaminophen, ibuprofen, naproxen, or ketoprofen unless instructed by your doctor. These medicines may hide a fever. Do not become pregnant while taking this medicine. Women should inform their doctor if they wish to become pregnant or think they might be pregnant. There is a potential for serious side effects to an unborn child. Talk to your health care professional or pharmacist for more information. Do not breast-feed an infant while taking this medicine. Men should inform their doctor if they wish to father a child. This medicine may lower sperm counts. Do not treat diarrhea with over the counter products. Contact your doctor if you have diarrhea that lasts more than 2 days or if it is severe and  watery. This medicine can make you more sensitive to the sun. Keep out of the sun. If you cannot avoid being in the sun, wear protective clothing and use sunscreen. Do not use sun lamps or tanning beds/booths. What side effects may I notice from receiving this medication? Side effects that you should report to your doctor or health care professional as soon as possible: allergic reactions like skin rash, itching or hives, swelling of the face, lips, or tongue low blood counts - this medicine may decrease the number of white blood cells, red blood cells and platelets. You may be at increased risk for infections and bleeding. signs of infection - fever or chills, cough, sore throat, pain or difficulty passing urine signs of decreased platelets or bleeding - bruising, pinpoint red spots on the skin, black, tarry stools, blood in the urine signs of decreased red blood cells - unusually weak or tired, fainting spells, lightheadedness breathing problems changes in vision chest pain mouth sores nausea and vomiting pain, swelling, redness at site where injected pain, tingling, numbness in the hands or feet redness, swelling, or sores on hands or feet stomach pain unusual bleeding Side effects that usually do not require medical attention (report to your doctor or health care professional if they continue or are bothersome): changes in finger or toe nails diarrhea dry or itchy skin hair loss headache loss of appetite sensitivity of eyes to the light stomach upset unusually teary eyes This list may not describe all possible side effects. Call your doctor for medical advice about side effects. You may report side effects to FDA at 1-800-FDA-1088. Where should I keep my medication? This drug is given in a hospital or clinic and will not be stored at home. NOTE: This sheet is a summary. It may not cover all possible information. If you have questions about this medicine, talk to your doctor,  pharmacist, or health care provider.  2023 Elsevier/Gold Standard (2020-12-17 00:00:00)

## 2021-08-16 NOTE — Progress Notes (Signed)
Patient seen by Dr. Benay Spice today  Vitals are within treatment parameters.  Labs reviewed by Dr. Benay Spice and are within treatment parameters.  Per physician team, patient is ready for treatment. Please note that modifications are being made to the treatment plan including    Only getting 5FU and pump today. Premeds and irinotecan removed.

## 2021-08-16 NOTE — Patient Instructions (Signed)

## 2021-08-18 ENCOUNTER — Inpatient Hospital Stay: Payer: Medicare Other

## 2021-08-18 ENCOUNTER — Telehealth: Payer: Self-pay | Admitting: *Deleted

## 2021-08-18 VITALS — BP 121/64 | HR 88 | Temp 98.2°F | Resp 18

## 2021-08-18 DIAGNOSIS — C20 Malignant neoplasm of rectum: Secondary | ICD-10-CM

## 2021-08-18 DIAGNOSIS — Z5111 Encounter for antineoplastic chemotherapy: Secondary | ICD-10-CM | POA: Diagnosis not present

## 2021-08-18 MED ORDER — SODIUM CHLORIDE 0.9% FLUSH
10.0000 mL | INTRAVENOUS | Status: DC | PRN
  Administered 2021-08-18: 10 mL

## 2021-08-18 MED ORDER — HEPARIN SOD (PORK) LOCK FLUSH 100 UNIT/ML IV SOLN
500.0000 [IU] | Freq: Once | INTRAVENOUS | Status: AC | PRN
  Administered 2021-08-18: 500 [IU]

## 2021-08-18 NOTE — Telephone Encounter (Signed)
Scheduled CT chest for 09/02/21 at [place]. Arrival at 4:15 pm for 4:30 pm scan. No prep. Call [phone] option 3 to reschedule. Left appointment information on her voice mail and sent Mychart message as well.

## 2021-08-18 NOTE — Patient Instructions (Signed)

## 2021-08-22 ENCOUNTER — Other Ambulatory Visit: Payer: Self-pay

## 2021-08-28 ENCOUNTER — Other Ambulatory Visit: Payer: Self-pay | Admitting: Oncology

## 2021-08-28 DIAGNOSIS — E039 Hypothyroidism, unspecified: Secondary | ICD-10-CM

## 2021-08-30 ENCOUNTER — Other Ambulatory Visit: Payer: Self-pay

## 2021-09-02 ENCOUNTER — Ambulatory Visit (HOSPITAL_BASED_OUTPATIENT_CLINIC_OR_DEPARTMENT_OTHER)
Admission: RE | Admit: 2021-09-02 | Discharge: 2021-09-02 | Disposition: A | Discharge status: Home / Self Care | Payer: Medicare Other | Source: Ambulatory Visit | Attending: Oncology | Admitting: Oncology

## 2021-09-02 DIAGNOSIS — C20 Malignant neoplasm of rectum: Secondary | ICD-10-CM | POA: Insufficient documentation

## 2021-09-04 ENCOUNTER — Other Ambulatory Visit: Payer: Self-pay | Admitting: Oncology

## 2021-09-06 ENCOUNTER — Inpatient Hospital Stay: Payer: Medicare Other | Attending: Oncology

## 2021-09-06 ENCOUNTER — Inpatient Hospital Stay: Payer: Medicare Other | Admitting: Oncology

## 2021-09-06 ENCOUNTER — Inpatient Hospital Stay: Payer: Medicare Other

## 2021-09-06 ENCOUNTER — Encounter: Payer: Self-pay | Admitting: *Deleted

## 2021-09-06 VITALS — BP 162/98 | HR 100 | Temp 98.2°F | Resp 20 | Ht 63.0 in | Wt 213.0 lb

## 2021-09-06 DIAGNOSIS — Z95828 Presence of other vascular implants and grafts: Secondary | ICD-10-CM

## 2021-09-06 DIAGNOSIS — I864 Gastric varices: Secondary | ICD-10-CM | POA: Insufficient documentation

## 2021-09-06 DIAGNOSIS — D709 Neutropenia, unspecified: Secondary | ICD-10-CM | POA: Insufficient documentation

## 2021-09-06 DIAGNOSIS — C20 Malignant neoplasm of rectum: Secondary | ICD-10-CM | POA: Diagnosis present

## 2021-09-06 DIAGNOSIS — K1230 Oral mucositis (ulcerative), unspecified: Secondary | ICD-10-CM | POA: Insufficient documentation

## 2021-09-06 DIAGNOSIS — J479 Bronchiectasis, uncomplicated: Secondary | ICD-10-CM | POA: Insufficient documentation

## 2021-09-06 DIAGNOSIS — Z923 Personal history of irradiation: Secondary | ICD-10-CM | POA: Diagnosis not present

## 2021-09-06 DIAGNOSIS — R0609 Other forms of dyspnea: Secondary | ICD-10-CM | POA: Diagnosis not present

## 2021-09-06 DIAGNOSIS — C787 Secondary malignant neoplasm of liver and intrahepatic bile duct: Secondary | ICD-10-CM | POA: Diagnosis not present

## 2021-09-06 DIAGNOSIS — I8500 Esophageal varices without bleeding: Secondary | ICD-10-CM | POA: Diagnosis not present

## 2021-09-06 DIAGNOSIS — D696 Thrombocytopenia, unspecified: Secondary | ICD-10-CM | POA: Insufficient documentation

## 2021-09-06 DIAGNOSIS — E039 Hypothyroidism, unspecified: Secondary | ICD-10-CM | POA: Insufficient documentation

## 2021-09-06 DIAGNOSIS — G620 Drug-induced polyneuropathy: Secondary | ICD-10-CM | POA: Diagnosis not present

## 2021-09-06 LAB — CBC WITH DIFFERENTIAL (CANCER CENTER ONLY)
Abs Immature Granulocytes: 0.05 10*3/uL (ref 0.00–0.07)
Basophils Absolute: 0 10*3/uL (ref 0.0–0.1)
Basophils Relative: 1 %
Eosinophils Absolute: 0.2 10*3/uL (ref 0.0–0.5)
Eosinophils Relative: 4 %
HCT: 31.2 % — ABNORMAL LOW (ref 36.0–46.0)
Hemoglobin: 10.6 g/dL — ABNORMAL LOW (ref 12.0–15.0)
Immature Granulocytes: 1 %
Lymphocytes Relative: 18 %
Lymphs Abs: 0.7 10*3/uL (ref 0.7–4.0)
MCH: 35.1 pg — ABNORMAL HIGH (ref 26.0–34.0)
MCHC: 34 g/dL (ref 30.0–36.0)
MCV: 103.3 fL — ABNORMAL HIGH (ref 80.0–100.0)
Monocytes Absolute: 0.6 10*3/uL (ref 0.1–1.0)
Monocytes Relative: 16 %
Neutro Abs: 2.4 10*3/uL (ref 1.7–7.7)
Neutrophils Relative %: 60 %
Platelet Count: 116 10*3/uL — ABNORMAL LOW (ref 150–400)
RBC: 3.02 MIL/uL — ABNORMAL LOW (ref 3.87–5.11)
RDW: 15.5 % (ref 11.5–15.5)
WBC Count: 4 10*3/uL (ref 4.0–10.5)
nRBC: 0 % (ref 0.0–0.2)

## 2021-09-06 LAB — CMP (CANCER CENTER ONLY)
ALT: 13 U/L (ref 0–44)
AST: 20 U/L (ref 15–41)
Albumin: 3.4 g/dL — ABNORMAL LOW (ref 3.5–5.0)
Alkaline Phosphatase: 51 U/L (ref 38–126)
Anion gap: 14 (ref 5–15)
BUN: 13 mg/dL (ref 8–23)
CO2: 23 mmol/L (ref 22–32)
Calcium: 8.9 mg/dL (ref 8.9–10.3)
Chloride: 108 mmol/L (ref 98–111)
Creatinine: 0.83 mg/dL (ref 0.44–1.00)
GFR, Estimated: >60 mL/min (ref >60)
Glucose, Bld: 94 mg/dL (ref 70–99)
Potassium: 3.6 mmol/L (ref 3.5–5.1)
Sodium: 145 mmol/L (ref 135–145)
Total Bilirubin: 0.9 mg/dL (ref 0.3–1.2)
Total Protein: 6.1 g/dL — ABNORMAL LOW (ref 6.5–8.1)

## 2021-09-06 MED ORDER — SODIUM CHLORIDE 0.9% FLUSH
10.0000 mL | Freq: Once | INTRAVENOUS | Status: AC
  Administered 2021-09-06: 10 mL

## 2021-09-06 MED ORDER — HEPARIN SOD (PORK) LOCK FLUSH 100 UNIT/ML IV SOLN
500.0000 [IU] | Freq: Once | INTRAVENOUS | Status: AC
  Administered 2021-09-06: 500 [IU]

## 2021-09-06 MED ORDER — DOXYCYCLINE HYCLATE 100 MG PO TABS: 100.0000 mg | ORAL_TABLET | Freq: Two times a day (BID) | ORAL | 3 refills | Status: DC

## 2021-09-06 NOTE — Addendum Note (Signed)
Addended by: Lenox Ponds E on: 09/06/2021 10:01 AM   Modules accepted: Orders

## 2021-09-06 NOTE — Progress Notes (Signed)
Provided reading information on panitumumab and discussed how to take doxycycline.

## 2021-09-06 NOTE — Progress Notes (Signed)
Patient seen by Dr. Benay Spice today  Vitals are not all within treatment parameters. BP elevated at 162/98  Labs reviewed by Dr. Benay Spice  [facility] pending  Per physician team, patient will not be receiving treatment today. Progression on CT scan.

## 2021-09-06 NOTE — Progress Notes (Signed)
[place] OFFICE PROGRESS NOTE   Diagnosis: Rectal cancer  INTERVAL HISTORY:   Samantha Lutz complete another cycle of 5-fluorouracil on 08/16/2021.  She reports developing nausea/vomiting beginning on 08/21/2021.  This lasted for 1 day.  Nausea and anorexia persisted for 5 days.  She had diarrhea at the end of the 5 days.  No other complaint.  Objective:  Vital signs in last 24 hours:  Blood pressure (!) 162/98, pulse 100, temperature 98.2 F (36.8 C), temperature source Oral, resp. rate 20, height '5\' 3"'  (1.6 m), weight 213 lb (96.6 kg), SpO2 98 %.    HEENT: No thrush or ulcer Resp: Lungs with decreased breath sounds at the right lower chest, no respiratory distress Cardio: Regular rate and rhythm GI: Nontender, no hepatosplenomegaly Vascular: No leg edema     Portacath/PICC-without erythema  Lab Results:  Lab Results  Component Value Date   WBC 4.0 09/06/2021   HGB 10.6 (L) 09/06/2021   HCT 31.2 (L) 09/06/2021   MCV 103.3 (H) 09/06/2021   PLT 116 (L) 09/06/2021   NEUTROABS 2.4 09/06/2021    CMP  Lab Results  Component Value Date   NA 143 08/16/2021   K 3.4 (L) 08/16/2021   CL 108 08/16/2021   CO2 24 08/16/2021   GLUCOSE 138 (H) 08/16/2021   BUN 14 08/16/2021   CREATININE 0.84 08/16/2021   CALCIUM 9.0 08/16/2021   PROT 5.7 (L) 08/16/2021   ALBUMIN 3.7 08/16/2021   AST 29 08/16/2021   ALT 26 08/16/2021   ALKPHOS 51 08/16/2021   BILITOT 0.9 08/16/2021   GFRNONAA >60 08/16/2021   GFRAA >60 10/28/2019    Lab Results  Component Value Date   CEA1 <1.00 06/01/2020   CEA <1.00 06/01/2020    No results found for: "INR", "LABPROT"  Imaging:  CT CHEST WO CONTRAST  Result Date: 09/04/2021 CLINICAL DATA:  Metastatic rectal cancer, pulmonary task disease, ongoing chemotherapy EXAM: CT CHEST WITHOUT CONTRAST TECHNIQUE: Multidetector CT imaging of the chest was performed following the standard protocol without IV contrast. RADIATION DOSE  REDUCTION: This exam was performed according to the departmental dose-optimization program which includes automated exposure control, adjustment of the mA and/or kV according to patient size and/or use of iterative reconstruction technique. COMPARISON:  04/08/2021 FINDINGS: Cardiovascular: Mild coronary artery calcification. Global cardiac size within normal limits. No pericardial effusion. Central pulmonary arteries are of normal caliber. Mild atherosclerotic calcification within the thoracic aorta. No aortic aneurysm. Right internal jugular chest port tip is seen within the right atrium. Mediastinum/Nodes: Visualized thyroid is unremarkable. Multiple enlarged pretracheal lymph nodes are again identified and appear technically stable since prior examination. Index lymph node is seen at axial image # 41/6 measuring 13 x 16 mm. No new pathologic thoracic adenopathy. Multiple esophageal varices are again identified. The esophagus is otherwise unremarkable. Lungs/Pleura: Multiple pulmonary nodules are again identified: Right upper lobe, axial image # 37/3, 8 mm x 10 mm, stable Left upper lobe, axial image # 61/3, 11 mm x 12 mm, previously 7 x 8 mm, enlarged Left upper lobe, axial image # 73/3, 10 mm x 14 mm, previously 8 x 11 mm, enlarged Left upper lobe, axial image # 68/3, 11 mm x 14 mm, previously 8 x 15 mm, technically stable No new focal pulmonary nodules are identified. Scattered ground-glass opacity within the right upper lobe has improved slightly in the interval possibly reflecting changes of radiation pneumonitis. There is stable complete collapse and consolidation of the right middle lobe  and consolidation, volume loss, traction bronchiectasis within the right lower lobe most in keeping with changes of post radiation fibrosis. Associated right pleural thickening is unchanged. No pneumothorax or pleural effusion. No central obstructing lesion. Upper Abdomen: Cholelithiasis. Adrenal glands are unremarkable.  Numerous varices are seen within the gastrohepatic ligament. No acute abnormality. Musculoskeletal: No lytic or blastic bone lesions are identified. IMPRESSION: 1. Multiple pulmonary nodules again identified, several of which have enlarged in the interval in keeping with progressive disease. No new focal pulmonary nodules identified. 2. Stable mediastinal adenopathy. 3. Stable post radiation changes within the right lower lobe and right middle lobe. 4. Mild coronary artery calcification. 5. Cholelithiasis. 6. Gastroesophageal varices again identified. Electronically Signed   By: Fidela Salisbury M.D.   On: 09/04/2021 23:10    Medications: I have reviewed the patient's current medications.   Assessment/Plan: Rectal cancer Mass at 7 cm from the anal verge on colonoscopy 08/09/2017, biopsy revealed invasive adenocarcinoma Staging CTs 08/17/2017-no evidence of metastatic disease, asymmetric thickening in the mid rectum MR pelvis 09/01/2017, T3N0 lesion beginning at 6.3 cm from the anal sphincter Radiation/Xeloda initiated 09/17/2017, completed 10/25/2017 Low anterior resection/diverting ileostomy 12/21/2017,ypT3,ypN1a tumor.  Lymphovascular invasion present, intact mismatch repair protein expression, treatment effect present (TRS 1).  Mismatch repair protein IHC normal; Foundation 1-KRAS/NRAS wild-type, microsatellite status and tumor mutational burden could not be determined. Cycle 1 adjuvant Xeloda beginning 01/21/2018 Cycle 2 adjuvant Xeloda beginning 02/11/2018 Xeloda discontinued after cycle 2 secondary to patient preference Ileostomy takedown 07/10/2018 CTs 09/07/2018- multiple live small pulmonary nodules concerning for metastatic disease CT chest 12/10/2018-multiple bilateral lung nodules, some have increased in size CT chest 04/08/2019-mild enlargement of bilateral lung nodules Status post SBRT multiple lung nodules 05/06/2019, 05/08/2019, 05/13/2019 CT chest 08/28/2019-improvement and resolution in majority of  right-sided pulmonary nodules, a superior segment right lower lobe nodule has increased, no new right-sided nodules.  Progressive enlargement of left-sided pulmonary nodules PET scan 09/09/2019-hypermetabolic right lower lobe nodule, 2 hypermetabolic left upper lobe nodules with an additional 0.7 cm left upper lobe nodule below PET resolution, groundglass opacity in the mid to right lower lobe with associated hypermetabolism consistent with postradiation change, hypermetabolic central segment 4A liver lesion Cycle 1 FOLFOX 10/14/2019 Cycle  2FOLFOX 11/11/2019, 5-FU and oxaliplatin dose reduced secondary to neutropenia and thrombocytopenia following cycle one, G-CSF declined Cycle 3 FOLFOX 11/26/2019 Cycle 4 FOLFOX 12/11/2019, oxaliplatin held due to neutropenia and thrombocytopenia Cycle 5 FOLFOX 12/31/2019 Cycle 6 FOLFOX 01/14/2020 (oxaliplatin held, 5-fluorouracil dose reduced due to mucositis) CT chest 01/27/2020-improvement in left lung nodules, progressive airspace disease with traction bronchiectasis throughout the right lung, previously noted right lung nodules are obscured, mildly enlarged right paratracheal lymph nodes-not hypermetabolic on prior PET, likely reactive Cycle 7 FOLFOX 01/28/2020 Cycle 8 FOLFOX 02/11/2020 (oxaliplatin held due to neutropenia and thrombocytopenia) Cycle 9 FOLFOX 02/25/2020 (oxaliplatin held due to thrombocytopenia) Cycle 10 FOLFOX 03/11/2020 (oxaliplatin held secondary to neuropathy) Cycle 11 FOLFOX 03/25/2020 (oxaliplatin held due to neuropathy) CT chest 04/05/2020-no new or progressive findings.  Interval evolution of presumed postradiation scarring in the right perihilar lung with decrease in the more diffuse groundglass opacity seen previously.  No substantial change in left lung nodules. 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  No definite CT findings to suggest recurrent tumor.  Stable small left upper lobe pulmonary nodules.  No new or progressive findings.  Stable small right paratracheal lymph nodes.  Stable area of irregular low-attenuation in hepatic segment 4A, site of known prior hepatic metastatic lesion.  No new or progressive findings. Cycle 18 5-fluorouracil 07/06/2020 Cycle 19 5-fluorouracil 07/20/2020 Cycle 20 5-fluorouracil 08/03/2020 Cycle 21 5-fluorouracil 08/17/2020  Cycle 22 5-fluorouracil 08/31/2020 Cycle 23 5-fluorouracil 09/14/2020 CT chest 09/24/2020-increased number and size of pulmonary nodules bilaterally.  Findings in the right upper lobe are concerning for potential lymphangitic spread of tumor.  Right upper lobe findings could also reflect evolving postradiation change. Cycle 1 FOLFIRI 10/12/2020 Cycle 2 FOLFIRI 11/08/2020, irinotecan dose reduced due to neutropenia and thrombocytopenia following cycle 1 Cycle 3 FOLFIRI 12/01/2020 Cycle 4 FOLFIRI 12/22/2020 CT chest 01/11/2021-similar bilateral pulmonary nodules.  Left upper lobe nodule has mildly decreased in size.  No new nodules. Cycle 5 FOLFIRI 01/12/2021 Cycle 6 FOLFIRI 02/03/2021 Cycle 7 FOLFIRI 03/01/2021 Cycle 8 FOLFIRI 03/22/2021 CT chest 04/08/2021-multiple small bilateral lung nodules not significantly changed.  No new nodules.  Unchanged enlarged pretracheal nodes.  Increase in fibrotic consolidation of the perihilar and inferior right lung. Cycle 9 FOLFIRI 04/12/2021 05/04/2021 treatment held due to fatigue Cycle 10 FOLFIRI 05/18/2021 06/07/2021-treatment held secondary to neutropenia Cycle 11 FOLFIRI 06/14/2021 Cycle 12 FOLFIRI 07/05/2021 Cycle 13 FOLFIRI 07/27/2021, irinotecan held 14 FOLFIRI 08/16/2021, irinotecan held CT chest 09/02/2021-enlargement of several lung nodules, no new  nodules, stable postradiation changes in the right lower lobe and right middle lobe, gastroesophageal varices identified Cycle one 5-FU/panitumumab 09/13/2021     2.   Hypothyroid   3.    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  Port-A-Cath placement 09/29/2019, interventional radiology   5.  Neutropenia and thrombocytopenia following cycle 1 FOLFOX-plan chemotherapy dose reductions, she declined G-CSF   6.  Mucositis following cycle 5 FOLFOX, 5-fluorouracil dose reduced with cycle 6 Mucositis following cycle 22 5-fluorouracil-Magic mouthwash added   7.  Right lung airspace disease/volume loss-likely toxicity from chest radiation, trial of prednisone 01/29/2020; dyspnea and cough improved 02/11/2020 Progressive cough 03/11/2020-prednisone resumed at a dose of 20 mg daily Cough and dyspnea improved 03/25/2020-prednisone taper to 15 mg daily Improved 04/06/2020-prednisone taper to 10 mg daily Stable 04/21/2020-prednisone taper to 5 mg daily  8.  Gastroesophageal varices        Disposition: Samantha Lutz has metastatic rectal cancer.  I reviewed the restaging CT findings and images with Ms. Mahrt and her daughter.  There is evidence of progressive disease involving multiple lung nodules.  We discussed treatment options including continuing 5-fluorouracil, changing to a different systemic therapy regimen, and a treatment break.  The tumor is RAS wild-type.  I recommended trial of panitumumab based therapy.  We discussed 5-FU/irinotecan/panitumumab.  She does not wish to receive additional irinotecan.  The plan is to proceed with 5-fluorouracil and panitumumab.  We reviewed potential toxicities associated with panitumumab including the chance of an allergic reaction, diarrhea, hypokalemia/hypomagnesemia, and rash.  She agrees to proceed.  She will be placed on doxycycline prophylaxis.  The plan is to begin treatment with 5-fluorouracil and panitumumab on  09/13/2021.  She will be scheduled for restaging CTs of the chest and abdomen/pelvis after approximately 3 months of 5-FU/panitumumab.  A chemotherapy plan was entered today.  The etiology of the nausea/vomiting following last cycle of chemotherapy is unclear.  She will call for recurrent symptoms.  Betsy Coder, MD  09/06/2021  10:04 AM

## 2021-09-07 ENCOUNTER — Other Ambulatory Visit: Payer: Self-pay | Admitting: Oncology

## 2021-09-07 DIAGNOSIS — C20 Malignant neoplasm of rectum: Secondary | ICD-10-CM

## 2021-09-07 NOTE — Progress Notes (Signed)
DISCONTINUE ON PATHWAY REGIMEN - Colorectal     A cycle is every 14 days:     Irinotecan      Leucovorin      Fluorouracil      Fluorouracil   **Always confirm dose/schedule in your pharmacy ordering system**  REASON: Disease Progression PRIOR TREATMENT: [id number]: FOLFIRI TREATMENT RESPONSE: Stable Disease (SD)  START OFF PATHWAY REGIMEN - Colorectal   [id number]:FOLFIRI + Panitumumab (Leucovorin IV D1 + Fluorouracil IV D1/CIV D1,2 + Irinotecan IV D1 + Panitumumab IV D1) q14 Days:   A cycle is every 14 days:     Panitumumab      Irinotecan      Leucovorin      Fluorouracil      Fluorouracil   **Always confirm dose/schedule in your pharmacy ordering system**  Patient Characteristics: Distant Metastases, Nonsurgical Candidate, KRAS/NRAS Wild-Type (BRAF V600 Wild-Type/Unknown), Standard Cytotoxic Therapy, Third Line Standard Cytotoxic Therapy, No Prior Anti-EGFR Therapy Tumor Location: Rectal Therapeutic Status: Distant Metastases Microsatellite/Mismatch Repair Status: MSS/pMMR BRAF Mutation Status: Wild-Type (no mutation) KRAS/NRAS Mutation Status: Wild-Type (no mutation) Preferred Therapy Approach: Standard Cytotoxic Therapy Standard Cytotoxic Line of Therapy: Third Building services engineer Cytotoxic Therapy Intent of Therapy: Non-Curative / Palliative Intent, Discussed with Patient

## 2021-09-08 ENCOUNTER — Inpatient Hospital Stay: Payer: Medicare Other

## 2021-09-08 ENCOUNTER — Encounter: Payer: Self-pay | Admitting: Oncology

## 2021-09-13 ENCOUNTER — Inpatient Hospital Stay: Payer: Medicare Other

## 2021-09-13 DIAGNOSIS — C20 Malignant neoplasm of rectum: Secondary | ICD-10-CM

## 2021-09-13 LAB — CMP (CANCER CENTER ONLY)
ALT: 14 U/L (ref 0–44)
AST: 23 U/L (ref 15–41)
Albumin: 3.6 g/dL (ref 3.5–5.0)
Alkaline Phosphatase: 53 U/L (ref 38–126)
Anion gap: 9 (ref 5–15)
BUN: 13 mg/dL (ref 8–23)
CO2: 26 mmol/L (ref 22–32)
Calcium: 8.4 mg/dL — ABNORMAL LOW (ref 8.9–10.3)
Chloride: 108 mmol/L (ref 98–111)
Creatinine: 0.81 mg/dL (ref 0.44–1.00)
GFR, Estimated: >60 mL/min (ref >60)
Glucose, Bld: 95 mg/dL (ref 70–99)
Potassium: 3.7 mmol/L (ref 3.5–5.1)
Sodium: 143 mmol/L (ref 135–145)
Total Bilirubin: 0.9 mg/dL (ref 0.3–1.2)
Total Protein: 6 g/dL — ABNORMAL LOW (ref 6.5–8.1)

## 2021-09-13 LAB — CBC WITH DIFFERENTIAL (CANCER CENTER ONLY)
Abs Immature Granulocytes: 0.03 10*3/uL (ref 0.00–0.07)
Basophils Absolute: 0 10*3/uL (ref 0.0–0.1)
Basophils Relative: 1 %
Eosinophils Absolute: 0.1 10*3/uL (ref 0.0–0.5)
Eosinophils Relative: 3 %
HCT: 33.4 % — ABNORMAL LOW (ref 36.0–46.0)
Hemoglobin: 11.3 g/dL — ABNORMAL LOW (ref 12.0–15.0)
Immature Granulocytes: 1 %
Lymphocytes Relative: 17 %
Lymphs Abs: 0.9 10*3/uL (ref 0.7–4.0)
MCH: 35.1 pg — ABNORMAL HIGH (ref 26.0–34.0)
MCHC: 33.8 g/dL (ref 30.0–36.0)
MCV: 103.7 fL — ABNORMAL HIGH (ref 80.0–100.0)
Monocytes Absolute: 0.6 10*3/uL (ref 0.1–1.0)
Monocytes Relative: 11 %
Neutro Abs: 3.8 10*3/uL (ref 1.7–7.7)
Neutrophils Relative %: 67 %
Platelet Count: 80 10*3/uL — ABNORMAL LOW (ref 150–400)
RBC: 3.22 MIL/uL — ABNORMAL LOW (ref 3.87–5.11)
RDW: 15 % (ref 11.5–15.5)
WBC Count: 5.6 10*3/uL (ref 4.0–10.5)
nRBC: 0 % (ref 0.0–0.2)

## 2021-09-13 LAB — MAGNESIUM: Magnesium: 2.4 mg/dL (ref 1.7–2.4)

## 2021-09-13 MED ORDER — HEPARIN SOD (PORK) LOCK FLUSH 100 UNIT/ML IV SOLN
500.0000 [IU] | Freq: Once | INTRAVENOUS | Status: AC
  Administered 2021-09-13: 500 [IU] via INTRAVENOUS

## 2021-09-13 MED ORDER — SODIUM CHLORIDE 0.9% FLUSH
10.0000 mL | Freq: Once | INTRAVENOUS | Status: AC
  Administered 2021-09-13: 10 mL via INTRAVENOUS

## 2021-09-13 NOTE — Patient Instructions (Signed)

## 2021-09-13 NOTE — Progress Notes (Signed)
Pt has decided against new treatment today. She has an appointment in 2 weeks and will see if there are any alternative treatments. Dr Benay Spice notified. Pt has decided not to have treatment today.  She would like some time to think about it.  She states that she has an appointment in 2 weeks and will discuss this further. Pt wondering if there is an alternative treatment.

## 2021-09-13 NOTE — Progress Notes (Signed)
Danae Orleans, LPN notified that patient is reconsidering the new treatment and wanted to speak to Dr Benay Spice. Dr Benay Spice will come to see patient.

## 2021-09-13 NOTE — Progress Notes (Signed)
Dr Benay Spice at chairside

## 2021-09-15 ENCOUNTER — Inpatient Hospital Stay: Payer: Medicare Other

## 2021-09-27 ENCOUNTER — Inpatient Hospital Stay: Payer: Medicare Other

## 2021-09-27 ENCOUNTER — Inpatient Hospital Stay: Payer: Medicare Other | Admitting: Oncology

## 2021-09-27 VITALS — BP 135/72 | HR 90 | Temp 98.1°F | Resp 18 | Wt 207.6 lb

## 2021-09-27 DIAGNOSIS — C20 Malignant neoplasm of rectum: Secondary | ICD-10-CM | POA: Diagnosis not present

## 2021-09-27 DIAGNOSIS — Z95828 Presence of other vascular implants and grafts: Secondary | ICD-10-CM | POA: Diagnosis not present

## 2021-09-27 DIAGNOSIS — E039 Hypothyroidism, unspecified: Secondary | ICD-10-CM

## 2021-09-27 LAB — CMP (CANCER CENTER ONLY)
ALT: 40 U/L (ref 0–44)
AST: 59 U/L — ABNORMAL HIGH (ref 15–41)
Albumin: 3.5 g/dL (ref 3.5–5.0)
Alkaline Phosphatase: 71 U/L (ref 38–126)
Anion gap: 11 (ref 5–15)
BUN: 15 mg/dL (ref 8–23)
CO2: 22 mmol/L (ref 22–32)
Calcium: 8.6 mg/dL — ABNORMAL LOW (ref 8.9–10.3)
Chloride: 107 mmol/L (ref 98–111)
Creatinine: 0.74 mg/dL (ref 0.44–1.00)
GFR, Estimated: >60 mL/min (ref >60)
Glucose, Bld: 100 mg/dL — ABNORMAL HIGH (ref 70–99)
Potassium: 3.6 mmol/L (ref 3.5–5.1)
Sodium: 140 mmol/L (ref 135–145)
Total Bilirubin: 1.3 mg/dL — ABNORMAL HIGH (ref 0.3–1.2)
Total Protein: 6.2 g/dL — ABNORMAL LOW (ref 6.5–8.1)

## 2021-09-27 LAB — CBC WITH DIFFERENTIAL (CANCER CENTER ONLY)
Abs Immature Granulocytes: 0.03 10*3/uL (ref 0.00–0.07)
Basophils Absolute: 0 10*3/uL (ref 0.0–0.1)
Basophils Relative: 0 %
Eosinophils Absolute: 0.2 10*3/uL (ref 0.0–0.5)
Eosinophils Relative: 4 %
HCT: 34.5 % — ABNORMAL LOW (ref 36.0–46.0)
Hemoglobin: 11.9 g/dL — ABNORMAL LOW (ref 12.0–15.0)
Immature Granulocytes: 1 %
Lymphocytes Relative: 18 %
Lymphs Abs: 1 10*3/uL (ref 0.7–4.0)
MCH: 34.8 pg — ABNORMAL HIGH (ref 26.0–34.0)
MCHC: 34.5 g/dL (ref 30.0–36.0)
MCV: 100.9 fL — ABNORMAL HIGH (ref 80.0–100.0)
Monocytes Absolute: 0.6 10*3/uL (ref 0.1–1.0)
Monocytes Relative: 11 %
Neutro Abs: 3.8 10*3/uL (ref 1.7–7.7)
Neutrophils Relative %: 66 %
Platelet Count: 116 10*3/uL — ABNORMAL LOW (ref 150–400)
RBC: 3.42 MIL/uL — ABNORMAL LOW (ref 3.87–5.11)
RDW: 13.5 % (ref 11.5–15.5)
WBC Count: 5.6 10*3/uL (ref 4.0–10.5)
nRBC: 0 % (ref 0.0–0.2)

## 2021-09-27 LAB — MAGNESIUM: Magnesium: 2 mg/dL (ref 1.7–2.4)

## 2021-09-27 MED ORDER — HEPARIN SOD (PORK) LOCK FLUSH 100 UNIT/ML IV SOLN
500.0000 [IU] | Freq: Once | INTRAVENOUS | Status: AC
  Administered 2021-09-27: 500 [IU]

## 2021-09-27 MED ORDER — SODIUM CHLORIDE 0.9% FLUSH
10.0000 mL | Freq: Once | INTRAVENOUS | Status: AC
  Administered 2021-09-27: 10 mL

## 2021-09-27 NOTE — Progress Notes (Signed)
[facility] OFFICE PROGRESS NOTE   Diagnosis: Rectal cancer  INTERVAL HISTORY:   Ms. Hetzer returns as scheduled.  She was scheduled begin treatment with 5-FU/panitumumab on 09/13/2021.  She decided against treatment.  I discussed treatment options and side effects of the 5-FU/panitumumab regimen with her on 09/13/2021.  She reports stable exertional dyspnea.  Objective:  Vital signs in last 24 hours:  Blood pressure 135/72, pulse 90, temperature 98.1 F (36.7 C), temperature source Skin, resp. rate 18, weight 207 lb 9.6 oz (94.2 kg), SpO2 98 %. Resp: Decreased breath sounds at the right lower posterior chest, no respiratory distress Cardio: Regular rate and rhythm GI: No hepatosplenomegaly, nontender Vascular: No leg edema   Portacath/PICC-without erythema  Lab Results:  Lab Results  Component Value Date   WBC 5.6 09/27/2021   HGB 11.9 (L) 09/27/2021   HCT 34.5 (L) 09/27/2021   MCV 100.9 (H) 09/27/2021   PLT 116 (L) 09/27/2021   NEUTROABS 3.8 09/27/2021    CMP  Lab Results  Component Value Date   NA 140 09/27/2021   K 3.6 09/27/2021   CL 107 09/27/2021   CO2 22 09/27/2021   GLUCOSE 100 (H) 09/27/2021   BUN 15 09/27/2021   CREATININE 0.74 09/27/2021   CALCIUM 8.6 (L) 09/27/2021   PROT 6.2 (L) 09/27/2021   ALBUMIN 3.5 09/27/2021   AST 59 (H) 09/27/2021   ALT 40 09/27/2021   ALKPHOS 71 09/27/2021   BILITOT 1.3 (H) 09/27/2021   GFRNONAA >60 09/27/2021   GFRAA >60 10/28/2019    Lab Results  Component Value Date   CEA1 <1.00 06/01/2020   CEA <1.00 06/01/2020    Medications: I have reviewed the patient's current medications.   Assessment/Plan: Rectal cancer Mass at 7 cm from the anal verge on colonoscopy 08/09/2017, biopsy revealed invasive adenocarcinoma Staging CTs 08/17/2017-no evidence of metastatic disease, asymmetric thickening in the mid rectum MR pelvis 09/01/2017, T3N0 lesion beginning at 6.3 cm from the anal  sphincter Radiation/Xeloda initiated 09/17/2017, completed 10/25/2017 Low anterior resection/diverting ileostomy 12/21/2017,ypT3,ypN1a tumor.  Lymphovascular invasion present, intact mismatch repair protein expression, treatment effect present (TRS 1).  Mismatch repair protein IHC normal; Foundation 1-KRAS/NRAS wild-type, microsatellite status and tumor mutational burden could not be determined. Cycle 1 adjuvant Xeloda beginning 01/21/2018 Cycle 2 adjuvant Xeloda beginning 02/11/2018 Xeloda discontinued after cycle 2 secondary to patient preference Ileostomy takedown 07/10/2018 CTs 09/07/2018- multiple live small pulmonary nodules concerning for metastatic disease CT chest 12/10/2018-multiple bilateral lung nodules, some have increased in size CT chest 04/08/2019-mild enlargement of bilateral lung nodules Status post SBRT multiple lung nodules 05/06/2019, 05/08/2019, 05/13/2019 CT chest 08/28/2019-improvement and resolution in majority of right-sided pulmonary nodules, a superior segment right lower lobe nodule has increased, no new right-sided nodules.  Progressive enlargement of left-sided pulmonary nodules PET scan 09/09/2019-hypermetabolic right lower lobe nodule, 2 hypermetabolic left upper lobe nodules with an additional 0.7 cm left upper lobe nodule below PET resolution, groundglass opacity in the mid to right lower lobe with associated hypermetabolism consistent with postradiation change, hypermetabolic central segment 4A liver lesion Cycle 1 FOLFOX 10/14/2019 Cycle  2FOLFOX 11/11/2019, 5-FU and oxaliplatin dose reduced secondary to neutropenia and thrombocytopenia following cycle one, G-CSF declined Cycle 3 FOLFOX 11/26/2019 Cycle 4 FOLFOX 12/11/2019, oxaliplatin held due to neutropenia and thrombocytopenia Cycle 5 FOLFOX 12/31/2019 Cycle 6 FOLFOX 01/14/2020 (oxaliplatin held, 5-fluorouracil dose reduced due to mucositis) CT chest 01/27/2020-improvement in left lung nodules, progressive airspace disease  with traction bronchiectasis throughout the right lung, previously  noted right lung nodules are obscured, mildly enlarged right paratracheal lymph nodes-not hypermetabolic on prior PET, likely reactive Cycle 7 FOLFOX 01/28/2020 Cycle 8 FOLFOX 02/11/2020 (oxaliplatin held due to neutropenia and thrombocytopenia) Cycle 9 FOLFOX 02/25/2020 (oxaliplatin held due to thrombocytopenia) Cycle 10 FOLFOX 03/11/2020 (oxaliplatin held secondary to neuropathy) Cycle 11 FOLFOX 03/25/2020 (oxaliplatin held due to neuropathy) CT chest 04/05/2020-no new or progressive findings.  Interval evolution of presumed postradiation scarring in the right perihilar lung with decrease in the more diffuse groundglass opacity seen previously.  No substantial change in left lung nodules. 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  No definite CT findings to suggest recurrent tumor.  Stable small left upper lobe pulmonary nodules.  No new or progressive findings.  Stable small right paratracheal lymph nodes.  Stable area of irregular low-attenuation in hepatic segment 4A, site of known prior hepatic metastatic lesion.  No new or progressive findings. Cycle 18 5-fluorouracil 07/06/2020 Cycle 19 5-fluorouracil 07/20/2020 Cycle 20 5-fluorouracil 08/03/2020 Cycle 21 5-fluorouracil 08/17/2020  Cycle 22 5-fluorouracil 08/31/2020 Cycle 23 5-fluorouracil 09/14/2020 CT chest 09/24/2020-increased number and size of pulmonary nodules bilaterally.  Findings in the right upper lobe are concerning for potential lymphangitic spread of tumor.  Right upper lobe findings could also reflect evolving postradiation change. Cycle 1 FOLFIRI 10/12/2020 Cycle 2 FOLFIRI  11/08/2020, irinotecan dose reduced due to neutropenia and thrombocytopenia following cycle 1 Cycle 3 FOLFIRI 12/01/2020 Cycle 4 FOLFIRI 12/22/2020 CT chest 01/11/2021-similar bilateral pulmonary nodules.  Left upper lobe nodule has mildly decreased in size.  No new nodules. Cycle 5 FOLFIRI 01/12/2021 Cycle 6 FOLFIRI 02/03/2021 Cycle 7 FOLFIRI 03/01/2021 Cycle 8 FOLFIRI 03/22/2021 CT chest 04/08/2021-multiple small bilateral lung nodules not significantly changed.  No new nodules.  Unchanged enlarged pretracheal nodes.  Increase in fibrotic consolidation of the perihilar and inferior right lung. Cycle 9 FOLFIRI 04/12/2021 05/04/2021 treatment held due to fatigue Cycle 10 FOLFIRI 05/18/2021 06/07/2021-treatment held secondary to neutropenia Cycle 11 FOLFIRI 06/14/2021 Cycle 12 FOLFIRI 07/05/2021 Cycle 13 FOLFIRI 07/27/2021, irinotecan held 14 FOLFIRI 08/16/2021, irinotecan held CT chest 09/02/2021-enlargement of several lung nodules, no new nodules, stable postradiation changes in the right lower lobe and right middle lobe, gastroesophageal varices identified      2.   Hypothyroid   3.    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  Port-A-Cath placement 09/29/2019, interventional radiology   5.  Neutropenia and thrombocytopenia following cycle 1 FOLFOX-plan chemotherapy dose reductions, she declined G-CSF   6.  Mucositis following cycle 5 FOLFOX, 5-fluorouracil dose reduced with cycle 6 Mucositis following cycle 22 5-fluorouracil-Magic mouthwash added   7.  Right lung airspace disease/volume loss-likely toxicity from chest radiation, trial of prednisone 01/29/2020; dyspnea and cough improved 02/11/2020 Progressive cough 03/11/2020-prednisone resumed at a dose of 20 mg daily Cough and dyspnea improved 03/25/2020-prednisone taper to 15 mg daily Improved 04/06/2020-prednisone taper to 10 mg daily Stable 04/21/2020-prednisone taper to 5 mg daily  8.  Gastroesophageal varices       Disposition: Ms. Samantha Lutz appears unchanged.  She has metastatic rectal cancer.  I recommend treatment with i panitumumab based therapy.  She declines further irinotecan.  The plan is to proceed with 5-FU/panitumumab.  She would like to hold on beginning treatment until early October.  She  is busy taking care of her husband's estate.  She will call for new symptoms.  She will return for an office visit with the plan to begin 5-FU/panitumumab on 11/01/2021.  She will begin doxycycline on 10/31/2021.  Betsy Coder, MD  09/27/2021  10:45 AM

## 2021-09-28 ENCOUNTER — Other Ambulatory Visit: Payer: Self-pay

## 2021-09-29 ENCOUNTER — Inpatient Hospital Stay: Payer: Medicare Other

## 2021-10-10 ENCOUNTER — Other Ambulatory Visit: Payer: Self-pay | Admitting: Oncology

## 2021-10-10 DIAGNOSIS — C20 Malignant neoplasm of rectum: Secondary | ICD-10-CM

## 2021-10-11 ENCOUNTER — Other Ambulatory Visit: Payer: Medicare Other

## 2021-10-11 ENCOUNTER — Inpatient Hospital Stay: Payer: Medicare Other

## 2021-10-11 ENCOUNTER — Inpatient Hospital Stay: Payer: Medicare Other | Admitting: Nurse Practitioner

## 2021-10-13 ENCOUNTER — Inpatient Hospital Stay: Payer: Medicare Other

## 2021-10-18 ENCOUNTER — Other Ambulatory Visit: Payer: Medicare Other

## 2021-10-18 ENCOUNTER — Ambulatory Visit: Payer: Medicare Other | Admitting: Oncology

## 2021-10-18 ENCOUNTER — Ambulatory Visit: Payer: Medicare Other

## 2021-11-01 ENCOUNTER — Inpatient Hospital Stay: Payer: Medicare Other

## 2021-11-01 ENCOUNTER — Inpatient Hospital Stay: Payer: Medicare Other | Admitting: Licensed Clinical Social Worker

## 2021-11-01 ENCOUNTER — Inpatient Hospital Stay: Payer: Medicare Other | Attending: Oncology

## 2021-11-01 ENCOUNTER — Encounter: Payer: Self-pay | Admitting: Nurse Practitioner

## 2021-11-01 ENCOUNTER — Inpatient Hospital Stay: Payer: Medicare Other | Admitting: Nurse Practitioner

## 2021-11-01 VITALS — BP 138/76 | HR 76 | Resp 20

## 2021-11-01 VITALS — BP 157/97 | HR 70 | Temp 98.1°F | Resp 20 | Ht 63.0 in | Wt 211.6 lb

## 2021-11-01 DIAGNOSIS — C787 Secondary malignant neoplasm of liver and intrahepatic bile duct: Secondary | ICD-10-CM | POA: Insufficient documentation

## 2021-11-01 DIAGNOSIS — Z79899 Other long term (current) drug therapy: Secondary | ICD-10-CM | POA: Insufficient documentation

## 2021-11-01 DIAGNOSIS — C20 Malignant neoplasm of rectum: Secondary | ICD-10-CM | POA: Diagnosis present

## 2021-11-01 DIAGNOSIS — Z5111 Encounter for antineoplastic chemotherapy: Secondary | ICD-10-CM | POA: Diagnosis present

## 2021-11-01 DIAGNOSIS — K1230 Oral mucositis (ulcerative), unspecified: Secondary | ICD-10-CM | POA: Diagnosis not present

## 2021-11-01 DIAGNOSIS — E039 Hypothyroidism, unspecified: Secondary | ICD-10-CM

## 2021-11-01 DIAGNOSIS — D696 Thrombocytopenia, unspecified: Secondary | ICD-10-CM | POA: Diagnosis not present

## 2021-11-01 LAB — CBC WITH DIFFERENTIAL (CANCER CENTER ONLY)
Abs Immature Granulocytes: 0.02 10*3/uL (ref 0.00–0.07)
Basophils Absolute: 0 10*3/uL (ref 0.0–0.1)
Basophils Relative: 1 %
Eosinophils Absolute: 0.2 10*3/uL (ref 0.0–0.5)
Eosinophils Relative: 4 %
HCT: 33.3 % — ABNORMAL LOW (ref 36.0–46.0)
Hemoglobin: 11.3 g/dL — ABNORMAL LOW (ref 12.0–15.0)
Immature Granulocytes: 1 %
Lymphocytes Relative: 17 %
Lymphs Abs: 0.7 10*3/uL (ref 0.7–4.0)
MCH: 33.2 pg (ref 26.0–34.0)
MCHC: 33.9 g/dL (ref 30.0–36.0)
MCV: 97.9 fL (ref 80.0–100.0)
Monocytes Absolute: 0.5 10*3/uL (ref 0.1–1.0)
Monocytes Relative: 13 %
Neutro Abs: 2.7 10*3/uL (ref 1.7–7.7)
Neutrophils Relative %: 64 %
Platelet Count: 88 10*3/uL — ABNORMAL LOW (ref 150–400)
RBC: 3.4 MIL/uL — ABNORMAL LOW (ref 3.87–5.11)
RDW: 12.7 % (ref 11.5–15.5)
WBC Count: 4.1 10*3/uL (ref 4.0–10.5)
nRBC: 0 % (ref 0.0–0.2)

## 2021-11-01 LAB — CMP (CANCER CENTER ONLY)
ALT: 28 U/L (ref 0–44)
AST: 35 U/L (ref 15–41)
Albumin: 3.5 g/dL (ref 3.5–5.0)
Alkaline Phosphatase: 86 U/L (ref 38–126)
Anion gap: 11 (ref 5–15)
BUN: 12 mg/dL (ref 8–23)
CO2: 24 mmol/L (ref 22–32)
Calcium: 8.9 mg/dL (ref 8.9–10.3)
Chloride: 108 mmol/L (ref 98–111)
Creatinine: 0.69 mg/dL (ref 0.44–1.00)
GFR, Estimated: >60 mL/min (ref >60)
Glucose, Bld: 90 mg/dL (ref 70–99)
Potassium: 3.8 mmol/L (ref 3.5–5.1)
Sodium: 143 mmol/L (ref 135–145)
Total Bilirubin: 1 mg/dL (ref 0.3–1.2)
Total Protein: 6.3 g/dL — ABNORMAL LOW (ref 6.5–8.1)

## 2021-11-01 LAB — TSH: TSH: 6.845 u[IU]/mL — ABNORMAL HIGH (ref 0.350–4.500)

## 2021-11-01 LAB — MAGNESIUM: Magnesium: 1.9 mg/dL (ref 1.7–2.4)

## 2021-11-01 MED ORDER — SODIUM CHLORIDE 0.9 % IV SOLN
1800.0000 mg/m2 | INTRAVENOUS | Status: DC
  Administered 2021-11-01: 3750 mg via INTRAVENOUS
  Filled 2021-11-01: qty 75

## 2021-11-01 MED ORDER — SODIUM CHLORIDE 0.9 % IV SOLN
6.0000 mg/kg | Freq: Once | INTRAVENOUS | Status: AC
  Administered 2021-11-01: 600 mg via INTRAVENOUS
  Filled 2021-11-01: qty 20

## 2021-11-01 MED ORDER — FAMOTIDINE IN NACL 20-0.9 MG/50ML-% IV SOLN
20.0000 mg | Freq: Once | INTRAVENOUS | Status: AC | PRN
  Administered 2021-11-01: 20 mg via INTRAVENOUS

## 2021-11-01 MED ORDER — SODIUM CHLORIDE 0.9 % IV SOLN
300.0000 mg/m2 | Freq: Once | INTRAVENOUS | Status: DC
  Filled 2021-11-01: qty 31.1

## 2021-11-01 MED ORDER — ALBUTEROL SULFATE HFA 108 (90 BASE) MCG/ACT IN AERS
2.0000 | INHALATION_SPRAY | Freq: Once | RESPIRATORY_TRACT | Status: AC | PRN
  Administered 2021-11-01: 2 via RESPIRATORY_TRACT

## 2021-11-01 MED ORDER — SODIUM CHLORIDE 0.9 % IV SOLN: Freq: Once | INTRAVENOUS | Status: AC

## 2021-11-01 MED ORDER — METHYLPREDNISOLONE SODIUM SUCC 125 MG IJ SOLR
125.0000 mg | Freq: Once | INTRAMUSCULAR | Status: AC | PRN
  Administered 2021-11-01: 125 mg via INTRAVENOUS

## 2021-11-01 MED ORDER — SODIUM CHLORIDE 0.9 % IV SOLN
300.0000 mg/m2 | Freq: Once | INTRAVENOUS | Status: AC
  Administered 2021-11-01: 622 mg via INTRAVENOUS
  Filled 2021-11-01: qty 31.1

## 2021-11-01 MED ORDER — SODIUM CHLORIDE 0.9 % IV SOLN: Freq: Once | INTRAVENOUS | Status: DC | PRN

## 2021-11-01 NOTE — Progress Notes (Addendum)
Samantha Lutz OFFICE PROGRESS NOTE   Diagnosis: Rectal cancer  INTERVAL HISTORY:   Samantha Lutz returns as scheduled.  She overall feels well.  Good appetite.  No abdominal pain.  She denies nausea/vomiting.  No diarrhea.  Stable dyspnea on exertion.  Objective:  Vital signs in last 24 hours:  Blood pressure (!) 157/97, pulse 70, temperature 98.1 F (36.7 C), temperature source Oral, resp. rate 20, height '5\' 3"'  (1.6 m), weight 211 lb 9.6 oz (96 kg), SpO2 98 %.    HEENT: No thrush or ulcers. Resp: Lungs clear bilaterally.  Mild decrease in breath sounds right lower lung field.  No respiratory distress. Cardio: Regular rate and rhythm. GI: Abdomen soft and nontender.  No hepatomegaly. Vascular: No leg edema. Neuro: Alert and oriented. Skin: Palms without erythema.  No rash. Port-A-Cath without erythema.  Lab Results:  Lab Results  Component Value Date   WBC 4.1 11/01/2021   HGB 11.3 (L) 11/01/2021   HCT 33.3 (L) 11/01/2021   MCV 97.9 11/01/2021   PLT 88 (L) 11/01/2021   NEUTROABS 2.7 11/01/2021    Imaging:  No results found.  Medications: I have reviewed the patient's current medications.  Assessment/Plan: Rectal cancer Mass at 7 cm from the anal verge on colonoscopy 08/09/2017, biopsy revealed invasive adenocarcinoma Staging CTs 08/17/2017-no evidence of metastatic disease, asymmetric thickening in the mid rectum MR pelvis 09/01/2017, T3N0 lesion beginning at 6.3 cm from the anal sphincter Radiation/Xeloda initiated 09/17/2017, completed 10/25/2017 Low anterior resection/diverting ileostomy 12/21/2017,ypT3,ypN1a tumor.  Lymphovascular invasion present, intact mismatch repair protein expression, treatment effect present (TRS 1).  Mismatch repair protein IHC normal; Foundation 1-KRAS/NRAS wild-type, microsatellite status and tumor mutational burden could not be determined. Cycle 1 adjuvant Xeloda beginning 01/21/2018 Cycle 2 adjuvant Xeloda beginning  02/11/2018 Xeloda discontinued after cycle 2 secondary to patient preference Ileostomy takedown 07/10/2018 CTs 09/07/2018- multiple live small pulmonary nodules concerning for metastatic disease CT chest 12/10/2018-multiple bilateral lung nodules, some have increased in size CT chest 04/08/2019-mild enlargement of bilateral lung nodules Status post SBRT multiple lung nodules 05/06/2019, 05/08/2019, 05/13/2019 CT chest 08/28/2019-improvement and resolution in majority of right-sided pulmonary nodules, a superior segment right lower lobe nodule has increased, no new right-sided nodules.  Progressive enlargement of left-sided pulmonary nodules PET scan 09/09/2019-hypermetabolic right lower lobe nodule, 2 hypermetabolic left upper lobe nodules with an additional 0.7 cm left upper lobe nodule below PET resolution, groundglass opacity in the mid to right lower lobe with associated hypermetabolism consistent with postradiation change, hypermetabolic central segment 4A liver lesion Cycle 1 FOLFOX 10/14/2019 Cycle  2FOLFOX 11/11/2019, 5-FU and oxaliplatin dose reduced secondary to neutropenia and thrombocytopenia following cycle one, G-CSF declined Cycle 3 FOLFOX 11/26/2019 Cycle 4 FOLFOX 12/11/2019, oxaliplatin held due to neutropenia and thrombocytopenia Cycle 5 FOLFOX 12/31/2019 Cycle 6 FOLFOX 01/14/2020 (oxaliplatin held, 5-fluorouracil dose reduced due to mucositis) CT chest 01/27/2020-improvement in left lung nodules, progressive airspace disease with traction bronchiectasis throughout the right lung, previously noted right lung nodules are obscured, mildly enlarged right paratracheal lymph nodes-not hypermetabolic on prior PET, likely reactive Cycle 7 FOLFOX 01/28/2020 Cycle 8 FOLFOX 02/11/2020 (oxaliplatin held due to neutropenia and thrombocytopenia) Cycle 9 FOLFOX 02/25/2020 (oxaliplatin held due to thrombocytopenia) Cycle 10 FOLFOX 03/11/2020 (oxaliplatin held secondary to neuropathy) Cycle 11 FOLFOX 03/25/2020  (oxaliplatin held due to neuropathy) CT chest 04/05/2020-no new or progressive findings.  Interval evolution of presumed postradiation scarring in the right perihilar lung with decrease in the more diffuse groundglass opacity seen previously.  No  substantial change in left lung nodules. 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  No definite CT findings to suggest recurrent tumor.  Stable small left upper lobe pulmonary nodules.  No new or progressive findings.  Stable small right paratracheal lymph nodes.  Stable area of irregular low-attenuation in hepatic segment 4A, site of known prior hepatic metastatic lesion.  No new or progressive findings. Cycle 18 5-fluorouracil 07/06/2020 Cycle 19 5-fluorouracil 07/20/2020 Cycle 20 5-fluorouracil 08/03/2020 Cycle 21 5-fluorouracil 08/17/2020  Cycle 22 5-fluorouracil 08/31/2020 Cycle 23 5-fluorouracil 09/14/2020 CT chest 09/24/2020-increased number and size of pulmonary nodules bilaterally.  Findings in the right upper lobe are concerning for potential lymphangitic spread of tumor.  Right upper lobe findings could also reflect evolving postradiation change. Cycle 1 FOLFIRI 10/12/2020 Cycle 2 FOLFIRI 11/08/2020, irinotecan dose reduced due to neutropenia and thrombocytopenia following cycle 1 Cycle 3 FOLFIRI 12/01/2020 Cycle 4 FOLFIRI 12/22/2020 CT chest 01/11/2021-similar bilateral pulmonary nodules.  Left upper lobe nodule has mildly decreased in size.  No new nodules. Cycle 5 FOLFIRI 01/12/2021 Cycle 6 FOLFIRI 02/03/2021 Cycle 7 FOLFIRI 03/01/2021 Cycle 8 FOLFIRI 03/22/2021 CT chest 04/08/2021-multiple small bilateral lung nodules not significantly changed.  No new nodules.   Unchanged enlarged pretracheal nodes.  Increase in fibrotic consolidation of the perihilar and inferior right lung. Cycle 9 FOLFIRI 04/12/2021 05/04/2021 treatment held due to fatigue Cycle 10 FOLFIRI 05/18/2021 06/07/2021-treatment held secondary to neutropenia Cycle 11 FOLFIRI 06/14/2021 Cycle 12 FOLFIRI 07/05/2021 Cycle 13 FOLFIRI 07/27/2021, irinotecan held 14 FOLFIRI 08/16/2021, irinotecan held CT chest 09/02/2021-enlargement of several lung nodules, no new nodules, stable postradiation changes in the right lower lobe and right middle lobe, gastroesophageal varices identified Cycle one 5-FU/Panitumumab 11/01/2021       2.   Hypothyroid   3.    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  Port-A-Cath placement 09/29/2019, interventional radiology   5.  Neutropenia and thrombocytopenia following cycle 1 FOLFOX-plan chemotherapy dose reductions, she declined G-CSF   6.  Mucositis following cycle 5 FOLFOX, 5-fluorouracil dose reduced with cycle 6 Mucositis following cycle 22 5-fluorouracil-Magic mouthwash added   7.  Right lung airspace disease/volume loss-likely toxicity from chest radiation, trial of prednisone 01/29/2020; dyspnea and cough improved 02/11/2020 Progressive cough 03/11/2020-prednisone resumed at a dose of 20 mg daily Cough and dyspnea improved 03/25/2020-prednisone taper to 15 mg daily Improved 04/06/2020-prednisone taper to 10 mg daily Stable 04/21/2020-prednisone taper to 5 mg daily   8.  Gastroesophageal varices     Disposition: Samantha Lutz appears stable.  She is scheduled to begin treatment today with 5-FU/Panitumumab every 2 weeks.  We again reviewed potential toxicities.  She agrees to proceed.  She began doxycycline yesterday.  CBC and chemistry panel reviewed.  Labs adequate to proceed as above.  She has stable mild thrombocytopenia.  She will return for lab, follow-up, cycle two 5-FU/Panitumumab in 2 weeks.  We are available to see her  sooner if needed.  Ned Card ANP/GNP-BC   11/01/2021  10:08 AM  Addendum 2:17 PM-Samantha Lutz developed a cough during the Panitumumab infusion.  The infusion was paused.  She denied shortness of breath.  She reported some chest tightness.  She received Solu-Medrol 125 mg IV and an albuterol nebulizer treatment.  The cough resolved.  Panitumumab  was resumed at 50% of the previous rate.  Near the end of the infusion she developed a recurrent cough.  Panitumumab was stopped/discontinued.  She was evaluated.  No distress.  Lungs clear.  Vital signs stable.  She was given Pepcid 20 mg IV.  The cough resolved.  Leucovorin and 5-fluorouracil proceeded with as scheduled.

## 2021-11-01 NOTE — Progress Notes (Signed)
[facility]  Initial Assessment   Samantha Lutz is a 71 y.o. year old female presenting alone. Clinical Social Work was referred by nurse navigator for assessment of psychosocial needs.   SDOH (Social Determinants of Health) assessments performed: Yes   SDOH Screenings   Depression (PHQ2-9): Low Risk  (11/01/2018)  Tobacco Use: Low Risk  (11/01/2021)     Distress Screen completed: No    02/18/2020    8:41 AM  ONCBCN DISTRESS SCREENING  Distress experienced in past week (1-10) 0      Family/Social Information:  Housing Arrangement: patient lives alone Family members/support persons in your life? Family and Friends Transportation concerns: no  Employment: Retired  Income source: Paediatric nurse concerns: No Type of concern: None Food access concerns: no Religious or spiritual practice: No Services Currently in place:  Medicare  Coping/ Adjustment to diagnosis: Patient understands treatment plan and what happens next? yes Concerns about diagnosis and/or treatment: I'm not especially worried about anything Patient reported stressors:  The death of her husband.    On their 37th wedding anniversary, her husband was informed he had cancer and died within eight months.  Hospice was involved. Hopes and/or priorities: Her family. Patient enjoys time with family/ friends and reading. Current coping skills/ strengths: Active sense of humor , Average or above average intelligence , Capable of independent living , Communication skills , Financial means , General fund of knowledge , Motivation for treatment/growth , and Supportive family/friends     SUMMARY: Current SDOH Barriers:  None  Clinical Social Work Clinical Goal(s):  Support patient through the loss of her husband in January 2023.  Interventions: Discussed common feeling and emotions when being diagnosed with cancer, and the importance of support during treatment Informed patient of  the support team roles and support services at [facility] Provided CSW contact information and encouraged patient to call with any questions or concerns Provided patient with information about the art program with Samantha Lutz and gave her an art packet to complete.   Also informed her that she could receive grief counseling through Hospice at no cost.  Follow Up Plan: CSW will follow-up with patient by phone  Patient verbalizes understanding of plan: Yes    Samantha Pickle [name], LCSW

## 2021-11-01 NOTE — Progress Notes (Signed)
Patient seen by Ned Card NP today  Vitals are within treatment parameters.  Labs reviewed by Ned Card NP and are not all within treatment parameters. Per Ned Card, NP ok to treat with platelets of 88 K/uL  Per physician team, patient is ready for treatment and there are NO modifications to the treatment plan.

## 2021-11-01 NOTE — Progress Notes (Signed)
Hypersensitivity Reaction note  Date of event: 11/01/21  Time of event: 12:12 PM  Generic name of drug involved: Panitumumab (VECTIBIX).  Name of provider notified of the hypersensitivity reaction: Ned Card, NP  Was agent that likely caused hypersensitivity reaction added to Allergies List within EMR? Yes  Chain of events including reaction signs/symptoms, treatment administered, and outcome (e.g., drug resumed; drug discontinued; sent to Emergency Department; etc.) Patient was receiving first time Vectibix today. Infusion started at 1133, at 1210, patient started coughing, water was offered but she continued coughing. Infusion was stopped at 1212. Patient denied any SOB or chest pain. Physician notified. 0.9% NS started at 999 ml/hr. Vitals obtained and documented on flowsheet. Per physician order, observe and resume infusion at 50% rate if coughing improves. Patient continues to cough after 20 minutes observation and started complaining of chest tightness. Per physician order, Solu-medrol 125 mg injection was administered and Ventolin 108 mcg 2 puff administered. Patient's coughing stopped and treatment was resumed at 1338 for half the previous rate. She tolerated the rest of the infusion till end of the bag and then the coughing resumed. Per physician order, Pepcid IVPB was administered. Patient's coughing resolved and she verbalized feeling normal at time of discharge. She knows to call the clinic with any questions or concerns. She also knows to go to the nearest ER if any symptoms develop and is uncontrolled.   Georgianne Fick, RN 11/01/2021 4:33 PM

## 2021-11-01 NOTE — Patient Instructions (Signed)
[facility]   Discharge Instructions: Thank you for choosing [facility] to provide your oncology and hematology care.   If you have a lab appointment with the [facility], please go directly to the [facility] and check in at the registration area.   Wear comfortable clothing and clothing appropriate for easy access to any Portacath or PICC line.   We strive to give you quality time with your provider. You may need to reschedule your appointment if you arrive late (15 or more minutes).  Arriving late affects you and other patients whose appointments are after yours.  Also, if you miss three or more appointments without notifying the office, you may be dismissed from the clinic at the provider's discretion.      For prescription refill requests, have your pharmacy contact our office and allow 72 hours for refills to be completed.    Today you received the following chemotherapy and/or immunotherapy agents Panitumumab (VECTIBIX), Leucovorin & Flourouracil (ADRUCIL).      To help prevent nausea and vomiting after your treatment, we encourage you to take your nausea medication as directed.  BELOW ARE SYMPTOMS THAT SHOULD BE REPORTED IMMEDIATELY: *FEVER GREATER THAN 100.4 F (38 C) OR HIGHER *CHILLS OR SWEATING *NAUSEA AND VOMITING THAT IS NOT CONTROLLED WITH YOUR NAUSEA MEDICATION *UNUSUAL SHORTNESS OF BREATH *UNUSUAL BRUISING OR BLEEDING *URINARY PROBLEMS (pain or burning when urinating, or frequent urination) *BOWEL PROBLEMS (unusual diarrhea, constipation, pain near the anus) TENDERNESS IN MOUTH AND THROAT WITH OR WITHOUT PRESENCE OF ULCERS (sore throat, sores in mouth, or a toothache) UNUSUAL RASH, SWELLING OR PAIN  UNUSUAL VAGINAL DISCHARGE OR ITCHING   Items with * indicate a potential emergency and should be followed up as soon as possible or go to the Emergency Department if any problems should occur.  Please show the CHEMOTHERAPY ALERT  CARD or IMMUNOTHERAPY ALERT CARD at check-in to the Emergency Department and triage nurse.  Should you have questions after your visit or need to cancel or reschedule your appointment, please contact [facility]  Dept: [phone]  and follow the prompts.  Office hours are 8:00 a.m. to 4:30 p.m. Monday - Friday. Please note that voicemails left after 4:00 p.m. may not be returned until the following business day.  We are closed weekends and major holidays. You have access to a nurse at all times for urgent questions. Please call the main number to the clinic Dept: [phone] and follow the prompts.   For any non-urgent questions, you may also contact your provider using MyChart. We now offer e-Visits for anyone 85 and older to request care online for non-urgent symptoms. For details visit [email or web address].   Also download the MyChart app! Go to the app store, search "MyChart", open the app, select [facility], and log in with your MyChart username and password.  Masks are optional in the cancer centers. If you would like for your care team to wear a mask while they are taking care of you, please let them know. You may have one support person who is at least 73 years old accompany you for your appointments.  Panitumumab Injection What is this medication? PANITUMUMAB (pan i TOOM ue mab) treats colorectal cancer. It works by blocking a protein that causes cancer cells to grow and multiply. This helps to slow or stop the spread of cancer cells. It is a monoclonal antibody. This medicine may be used for other purposes; ask your  health care provider or pharmacist if you have questions. COMMON BRAND NAME(S): Vectibix What should I tell my care team before I take this medication? They need to know if you have any of these conditions: Eye disease Low levels of magnesium in the blood Lung disease An unusual or allergic reaction to panitumumab, other medications, foods,  dyes, or preservatives Pregnant or trying to get pregnant Breast-feeding How should I use this medication? This medication is injected into a vein. It is given by your care team in a hospital or clinic setting. Talk to your care team about the use of this medication in children. Special care may be needed. Overdosage: If you think you have taken too much of this medicine contact a poison control center or emergency room at once. NOTE: This medicine is only for you. Do not share this medicine with others. What if I miss a dose? Keep appointments for follow-up doses. It is important not to miss your dose. Call your care team if you are unable to keep an appointment. What may interact with this medication? Bevacizumab This list may not describe all possible interactions. Give your health care provider a list of all the medicines, herbs, non-prescription drugs, or dietary supplements you use. Also tell them if you smoke, drink alcohol, or use illegal drugs. Some items may interact with your medicine. What should I watch for while using this medication? Your condition will be monitored carefully while you are receiving this medication. This medication may make you feel generally unwell. This is not uncommon as chemotherapy can affect healthy cells as well as cancer cells. Report any side effects. Continue your course of treatment even though you feel ill unless your care team tells you to stop. This medication can make you more sensitive to the sun. Keep out of the sun while receiving this medication and for 2 months after stopping therapy. If you cannot avoid being in the sun, wear protective clothing and sunscreen. Do not use sun lamps, tanning beds, or tanning booths. Check with your care team if you have severe diarrhea, nausea, and vomiting or if you sweat a lot. The loss of too much body fluid may make it dangerous for you to take this medication. This medication may cause serious skin reactions.  They can happen weeks to months after starting the medication. Contact your care team right away if you notice fevers or flu-like symptoms with a rash. The rash may be red or purple and then turn into blisters or peeling of the skin. You may also notice a red rash with swelling of the face, lips, or lymph nodes in your neck or under your arms. Talk to your care team if you may be pregnant. Serious birth defects can occur if you take this medication during pregnancy and for 2 months after the last dose. Contraception is recommended while taking this medication and for 2 months after the last dose. Your care team can help you find the option that works for you. Do not breastfeed while taking this medication and for 2 months after the last dose. This medication may cause infertility. Talk to your care team if you are concerned about your fertility. What side effects may I notice from receiving this medication? Side effects that you should report to your care team as soon as possible: Allergic reactions--skin rash, itching, hives, swelling of the face, lips, tongue, or throat Dry cough, shortness of breath or trouble breathing Eye pain, redness, irritation, or discharge with blurry  or decreased vision Infusion reactions--chest pain, shortness of breath or trouble breathing, feeling faint or lightheaded Low magnesium level--muscle pain or cramps, unusual weakness or fatigue, fast or irregular heartbeat, tremors Low potassium level--muscle pain or cramps, unusual weakness or fatigue, fast or irregular heartbeat, constipation Redness, blistering, peeling, or loosening of the skin, including inside the mouth Skin reactions on sun-exposed areas Side effects that usually do not require medical attention (report to your care team if they continue or are bothersome): Change in nail shape, thickness, or color Diarrhea Dry skin Fatigue Nausea Vomiting This list may not describe all possible side effects. Call  your doctor for medical advice about side effects. You may report side effects to FDA at 1-800-FDA-1088. Where should I keep my medication? This medication is given in a hospital or clinic. It will not be stored at home. NOTE: This sheet is a summary. It may not cover all possible information. If you have questions about this medicine, talk to your doctor, pharmacist, or health care provider.  2023 Elsevier/Gold Standard (2021-05-11 00:00:00)  Leucovorin Injection What is this medication? LEUCOVORIN (loo koe VOR in) prevents side effects from certain medications, such as methotrexate. It works by increasing folate levels. This helps protect healthy cells in your body. It may also be used to treat anemia caused by low levels of folate. It can also be used with fluorouracil, a type of chemotherapy, to treat colorectal cancer. It works by increasing the effects of fluorouracil in the body. This medicine may be used for other purposes; ask your health care provider or pharmacist if you have questions. What should I tell my care team before I take this medication? They need to know if you have any of these conditions: Anemia from low levels of vitamin B12 in the blood An unusual or allergic reaction to leucovorin, folic acid, other medications, foods, dyes, or preservatives Pregnant or trying to get pregnant Breastfeeding How should I use this medication? This medication is injected into a vein or a muscle. It is given by your care team in a hospital or clinic setting. Talk to your care team about the use of this medication in children. Special care may be needed. Overdosage: If you think you have taken too much of this medicine contact a poison control center or emergency room at once. NOTE: This medicine is only for you. Do not share this medicine with others. What if I miss a dose? Keep appointments for follow-up doses. It is important not to miss your dose. Call your care team if you are unable  to keep an appointment. What may interact with this medication? Capecitabine Fluorouracil Phenobarbital Phenytoin Primidone Trimethoprim;sulfamethoxazole This list may not describe all possible interactions. Give your health care provider a list of all the medicines, herbs, non-prescription drugs, or dietary supplements you use. Also tell them if you smoke, drink alcohol, or use illegal drugs. Some items may interact with your medicine. What should I watch for while using this medication? Your condition will be monitored carefully while you are receiving this medication. This medication may increase the side effects of 5-fluorouracil. Tell your care team if you have diarrhea or mouth sores that do not get better or that get worse. What side effects may I notice from receiving this medication? Side effects that you should report to your care team as soon as possible: Allergic reactions--skin rash, itching, hives, swelling of the face, lips, tongue, or throat This list may not describe all possible side effects. Call  your doctor for medical advice about side effects. You may report side effects to FDA at 1-800-FDA-1088. Where should I keep my medication? This medication is given in a hospital or clinic. It will not be stored at home. NOTE: This sheet is a summary. It may not cover all possible information. If you have questions about this medicine, talk to your doctor, pharmacist, or health care provider.  2023 Elsevier/Gold Standard (2021-05-27 00:00:00)  Fluorouracil Injection What is this medication? FLUOROURACIL (flure oh YOOR a sil) treats some types of cancer. It works by slowing down the growth of cancer cells. This medicine may be used for other purposes; ask your health care provider or pharmacist if you have questions. COMMON BRAND NAME(S): Adrucil What should I tell my care team before I take this medication? They need to know if you have any of these conditions: Blood  disorders Dihydropyrimidine dehydrogenase (DPD) deficiency Infection, such as chickenpox, cold sores, herpes Kidney disease Liver disease Poor nutrition Recent or ongoing radiation therapy An unusual or allergic reaction to fluorouracil, other medications, foods, dyes, or preservatives If you or your partner are pregnant or trying to get pregnant Breast-feeding How should I use this medication? This medication is injected into a vein. It is administered by your care team in a hospital or clinic setting. Talk to your care team about the use of this medication in children. Special care may be needed. Overdosage: If you think you have taken too much of this medicine contact a poison control center or emergency room at once. NOTE: This medicine is only for you. Do not share this medicine with others. What if I miss a dose? Keep appointments for follow-up doses. It is important not to miss your dose. Call your care team if you are unable to keep an appointment. What may interact with this medication? Do not take this medication with any of the following: Live virus vaccines This medication may also interact with the following: Medications that treat or prevent blood clots, such as warfarin, enoxaparin, dalteparin This list may not describe all possible interactions. Give your health care provider a list of all the medicines, herbs, non-prescription drugs, or dietary supplements you use. Also tell them if you smoke, drink alcohol, or use illegal drugs. Some items may interact with your medicine. What should I watch for while using this medication? Your condition will be monitored carefully while you are receiving this medication. This medication may make you feel generally unwell. This is not uncommon as chemotherapy can affect healthy cells as well as cancer cells. Report any side effects. Continue your course of treatment even though you feel ill unless your care team tells you to stop. In some  cases, you may be given additional medications to help with side effects. Follow all directions for their use. This medication may increase your risk of getting an infection. Call your care team for advice if you get a fever, chills, sore throat, or other symptoms of a cold or flu. Do not treat yourself. Try to avoid being around people who are sick. This medication may increase your risk to bruise or bleed. Call your care team if you notice any unusual bleeding. Be careful brushing or flossing your teeth or using a toothpick because you may get an infection or bleed more easily. If you have any dental work done, tell your dentist you are receiving this medication. Avoid taking medications that contain aspirin, acetaminophen, ibuprofen, naproxen, or ketoprofen unless instructed by your care team.  These medications may hide a fever. Do not treat diarrhea with over the counter products. Contact your care team if you have diarrhea that lasts more than 2 days or if it is severe and watery. This medication can make you more sensitive to the sun. Keep out of the sun. If you cannot avoid being in the sun, wear protective clothing and sunscreen. Do not use sun lamps, tanning beds, or tanning booths. Talk to your care team if you or your partner wish to become pregnant or think you might be pregnant. This medication can cause serious birth defects if taken during pregnancy and for 3 months after the last dose. A reliable form of contraception is recommended while taking this medication and for 3 months after the last dose. Talk to your care team about effective forms of contraception. Do not father a child while taking this medication and for 3 months after the last dose. Use a condom while having sex during this time period. Do not breastfeed while taking this medication. This medication may cause infertility. Talk to your care team if you are concerned about your fertility. What side effects may I notice from  receiving this medication? Side effects that you should report to your care team as soon as possible: Allergic reactions--skin rash, itching, hives, swelling of the face, lips, tongue, or throat Heart attack--pain or tightness in the chest, shoulders, arms, or jaw, nausea, shortness of breath, cold or clammy skin, feeling faint or lightheaded Heart failure--shortness of breath, swelling of the ankles, feet, or hands, sudden weight gain, unusual weakness or fatigue Heart rhythm changes--fast or irregular heartbeat, dizziness, feeling faint or lightheaded, chest pain, trouble breathing High ammonia level--unusual weakness or fatigue, confusion, loss of appetite, nausea, vomiting, seizures Infection--fever, chills, cough, sore throat, wounds that don't heal, pain or trouble when passing urine, general feeling of discomfort or being unwell Low red blood cell level--unusual weakness or fatigue, dizziness, headache, trouble breathing Pain, tingling, or numbness in the hands or feet, muscle weakness, change in vision, confusion or trouble speaking, loss of balance or coordination, trouble walking, seizures Redness, swelling, and blistering of the skin over hands and feet Severe or prolonged diarrhea Unusual bruising or bleeding Side effects that usually do not require medical attention (report to your care team if they continue or are bothersome): Dry skin Headache Increased tears Nausea Pain, redness, or swelling with sores inside the mouth or throat Sensitivity to light Vomiting This list may not describe all possible side effects. Call your doctor for medical advice about side effects. You may report side effects to FDA at 1-800-FDA-1088. Where should I keep my medication? This medication is given in a hospital or clinic. It will not be stored at home. NOTE: This sheet is a summary. It may not cover all possible information. If you have questions about this medicine, talk to your doctor,  pharmacist, or health care provider.  2023 Elsevier/Gold Standard (2021-05-24 00:00:00)  The chemotherapy medication bag should finish at 46 hours, 96 hours, or 7 days. For example, if your pump is scheduled for 46 hours and it was put on at 4:00 p.m., it should finish at 2:00 p.m. the day it is scheduled to come off regardless of your appointment time.     Estimated time to finish at 1:30 p.m. on Thursday 11/03/2021.   If the display on your pump reads "Low Volume" and it is beeping, take the batteries out of the pump and come to the cancer center for  it to be taken off.   If the pump alarms go off prior to the pump reading "Low Volume" then call [phone] and someone can assist you.  If the plunger comes out and the chemotherapy medication is leaking out, please use your home chemo spill kit to clean up the spill. Do NOT use paper towels or other household products.  If you have problems or questions regarding your pump, please call either [phone] (24 hours a day) or the cancer center Monday-Friday 8:00 a.m.- 4:30 p.m. at the clinic number and we will assist you. If you are unable to get assistance, then go to the nearest Emergency Department and ask the staff to contact the IV team for assistance.

## 2021-11-02 ENCOUNTER — Telehealth: Payer: Self-pay

## 2021-11-02 ENCOUNTER — Other Ambulatory Visit: Payer: Self-pay

## 2021-11-02 NOTE — Telephone Encounter (Signed)
24 Hour Call Back  Telephone call to patient post first time Vectibix infusion. Unable to reach patient and left a voice message.   Patient is schedule for her pump stop tomorrow Thursday 11/03/2021 at 1:30 PM.

## 2021-11-02 NOTE — Telephone Encounter (Signed)
Error

## 2021-11-03 ENCOUNTER — Inpatient Hospital Stay: Payer: Medicare Other

## 2021-11-03 VITALS — BP 162/70 | HR 80 | Temp 98.0°F | Resp 20

## 2021-11-03 DIAGNOSIS — C20 Malignant neoplasm of rectum: Secondary | ICD-10-CM

## 2021-11-03 DIAGNOSIS — Z5111 Encounter for antineoplastic chemotherapy: Secondary | ICD-10-CM | POA: Diagnosis not present

## 2021-11-03 MED ORDER — HEPARIN SOD (PORK) LOCK FLUSH 100 UNIT/ML IV SOLN
500.0000 [IU] | Freq: Once | INTRAVENOUS | Status: AC | PRN
  Administered 2021-11-03: 500 [IU]

## 2021-11-03 MED ORDER — SODIUM CHLORIDE 0.9% FLUSH
10.0000 mL | INTRAVENOUS | Status: DC | PRN
  Administered 2021-11-03: 10 mL

## 2021-11-03 NOTE — Patient Instructions (Signed)
Heparin injection What is this medication? HEPARIN (HEP a rin) is an anticoagulant. It is used to treat or prevent clots in the veins, arteries, lungs, or heart. It stops clots from forming or getting bigger. This medicine prevents clotting during open-heart surgery, dialysis, or in patients who are confined to bed. This medicine may be used for other purposes; ask your health care provider or pharmacist if you have questions. COMMON BRAND NAME(S): Hep-Lock, Hep-Lock U/P, Hepflush-10, Monoject Prefill Advanced Heparin Lock Flush, SASH Normal Saline and Heparin What should I tell my care team before I take this medication? They need to know if you have any of these conditions: bleeding disorders, such as hemophilia or low blood platelets bowel disease or diverticulitis endocarditis high blood pressure liver disease recent surgery or delivery of a baby stomach ulcers an unusual or allergic reaction to heparin, benzyl alcohol, sulfites, other medicines, foods, dyes, or preservatives pregnant or trying to get pregnant breast-feeding How should I use this medication? This medicine is given by injection or infusion into a vein. It can also be given by injection of small amounts under the skin. It is usually given by a health care professional in a hospital or clinic setting. If you get this medicine at home, you will be taught how to prepare and give this medicine. Use exactly as directed. Take your medicine at regular intervals. Do not take it more often than directed. Do not stop taking except on your doctor's advice. Stopping this medicine may increase your risk of a blot clot. Be sure to refill your prescription before you run out of medicine. It is important that you put your used needles and syringes in a special sharps container. Do not put them in a trash can. If you do not have a sharps container, call your pharmacist or healthcare provider to get one. Talk to your pediatrician regarding the  use of this medicine in children. While this medicine may be prescribed for children for selected conditions, precautions do apply. Overdosage: If you think you have taken too much of this medicine contact a poison control center or emergency room at once. NOTE: This medicine is only for you. Do not share this medicine with others. What if I miss a dose? If you miss a dose, take it as soon as you can. If it is almost time for your next dose, take only that dose. Do not take double or extra doses. What may interact with this medication? Do not take this medicine with any of the following medications: aspirin and aspirin-like drugs mifepristone medicines that treat or prevent blood clots like warfarin, enoxaparin, and dalteparin palifermin protamine This medicine may also interact with the following medications: dextran digoxin hydroxychloroquine medicines for treating colds or allergies nicotine NSAIDs, medicines for pain and inflammation, like ibuprofen or naproxen phenylbutazone tetracycline antibiotics This list may not describe all possible interactions. Give your health care provider a list of all the medicines, herbs, non-prescription drugs, or dietary supplements you use. Also tell them if you smoke, drink alcohol, or use illegal drugs. Some items may interact with your medicine. What should I watch for while using this medication? Visit your healthcare professional for regular checks on your progress. You may need blood work done while you are taking this medicine. Your condition will be monitored carefully while you are receiving this medicine. It is important not to miss any appointments. Wear a medical ID bracelet or chain, and carry a card that describes your disease and details   of your medicine and dosage times. Notify your doctor or healthcare professional at once if you have cold, blue hands or feet. If you are going to need surgery or other procedure, tell your healthcare  professional that you are using this medicine. Avoid sports and activities that might cause injury while you are using this medicine. Severe falls or injuries can cause unseen bleeding. Be careful when using sharp tools or knives. Consider using an electric razor. Take special care brushing or flossing your teeth. Report any injuries, bruising, or red spots on the skin to your healthcare professional. Using this medicine for a long time may weaken your bones and increase the risk of bone fractures. You should make sure that you get enough calcium and vitamin D while you are taking this medicine. Discuss the foods you eat and the vitamins you take with your healthcare professional. Wear a medical ID bracelet or chain. Carry a card that describes your disease and details of your medicine and dosage times. What side effects may I notice from receiving this medication? Side effects that you should report to your doctor or health care professional as soon as possible: allergic reactions like skin rash, itching or hives, swelling of the face, lips, or tongue bone pain fever, chills nausea, vomiting signs and symptoms of bleeding such as bloody or black, tarry stools; red or dark-brown urine; spitting up blood or brown material that looks like coffee grounds; red spots on the skin; unusual bruising or bleeding from the eye, gums, or nose signs and symptoms of a blood clot such as chest pain; shortness of breath; pain, swelling, or warmth in the leg signs and symptoms of a stroke such as changes in vision; confusion; trouble speaking or understanding; severe headaches; sudden numbness or weakness of the face, arm or leg; trouble walking; dizziness; loss of coordination Side effects that usually do not require medical attention (report to your doctor or health care professional if they continue or are bothersome): hair loss pain, redness, or irritation at site where injected This list may not describe all  possible side effects. Call your doctor for medical advice about side effects. You may report side effects to FDA at 1-800-FDA-1088. Where should I keep my medication? Keep out of the reach of children. Store unopened vials at room temperature between 15 and 30 degrees C (59 and 86 degrees F). Do not freeze. Do not use if solution is discolored or particulate matter is present. Throw away any unused medicine after the expiration date. NOTE: This sheet is a summary. It may not cover all possible information. If you have questions about this medicine, talk to your doctor, pharmacist, or health care provider.  2023 [employer] (2004-10-24 00:00:00)  

## 2021-11-13 ENCOUNTER — Other Ambulatory Visit: Payer: Self-pay | Admitting: Oncology

## 2021-11-13 DIAGNOSIS — C20 Malignant neoplasm of rectum: Secondary | ICD-10-CM

## 2021-11-14 ENCOUNTER — Inpatient Hospital Stay: Payer: Medicare Other

## 2021-11-14 ENCOUNTER — Inpatient Hospital Stay: Payer: Medicare Other | Admitting: Nurse Practitioner

## 2021-11-15 ENCOUNTER — Inpatient Hospital Stay: Payer: Medicare Other

## 2021-11-15 ENCOUNTER — Telehealth: Payer: Self-pay

## 2021-11-15 NOTE — Telephone Encounter (Signed)
Patient was not seen for her Lab, Flush and MD visit yesterday Monday 11/14/2021. [facility] nurse was notified.   Patient not seen today as well for her infusion appointment. [facility] nurse notified too. Telephone call made to patient's cell phone and home phone with no response. Left voice message. Patient's daughters Junie [place] and [place]) where called as well with no response, this writer left them a voice message.

## 2021-11-17 ENCOUNTER — Inpatient Hospital Stay: Payer: Medicare Other

## 2021-11-27 ENCOUNTER — Other Ambulatory Visit: Payer: Self-pay | Admitting: Oncology

## 2021-11-29 ENCOUNTER — Other Ambulatory Visit: Payer: Self-pay | Admitting: Nurse Practitioner

## 2021-11-29 ENCOUNTER — Inpatient Hospital Stay: Payer: Medicare Other | Admitting: Oncology

## 2021-11-29 ENCOUNTER — Inpatient Hospital Stay: Payer: Medicare Other

## 2021-11-29 VITALS — BP 160/79 | HR 87 | Temp 98.2°F | Resp 18 | Ht 63.0 in | Wt 209.4 lb

## 2021-11-29 VITALS — BP 154/77 | HR 77 | Temp 98.3°F | Resp 20

## 2021-11-29 DIAGNOSIS — C20 Malignant neoplasm of rectum: Secondary | ICD-10-CM

## 2021-11-29 DIAGNOSIS — Z5111 Encounter for antineoplastic chemotherapy: Secondary | ICD-10-CM | POA: Diagnosis not present

## 2021-11-29 LAB — CMP (CANCER CENTER ONLY)
ALT: 24 U/L (ref 0–44)
AST: 37 U/L (ref 15–41)
Albumin: 3.3 g/dL — ABNORMAL LOW (ref 3.5–5.0)
Alkaline Phosphatase: 86 U/L (ref 38–126)
Anion gap: 10 (ref 5–15)
BUN: 13 mg/dL (ref 8–23)
CO2: 24 mmol/L (ref 22–32)
Calcium: 8.6 mg/dL — ABNORMAL LOW (ref 8.9–10.3)
Chloride: 109 mmol/L (ref 98–111)
Creatinine: 0.69 mg/dL (ref 0.44–1.00)
GFR, Estimated: >60 mL/min (ref >60)
Glucose, Bld: 91 mg/dL (ref 70–99)
Potassium: 3 mmol/L — ABNORMAL LOW (ref 3.5–5.1)
Sodium: 143 mmol/L (ref 135–145)
Total Bilirubin: 1.1 mg/dL (ref 0.3–1.2)
Total Protein: 5.6 g/dL — ABNORMAL LOW (ref 6.5–8.1)

## 2021-11-29 LAB — CBC WITH DIFFERENTIAL (CANCER CENTER ONLY)
Abs Immature Granulocytes: 0.02 10*3/uL (ref 0.00–0.07)
Basophils Absolute: 0 10*3/uL (ref 0.0–0.1)
Basophils Relative: 0 %
Eosinophils Absolute: 0.2 10*3/uL (ref 0.0–0.5)
Eosinophils Relative: 4 %
HCT: 34.1 % — ABNORMAL LOW (ref 36.0–46.0)
Hemoglobin: 11.8 g/dL — ABNORMAL LOW (ref 12.0–15.0)
Immature Granulocytes: 0 %
Lymphocytes Relative: 17 %
Lymphs Abs: 0.8 10*3/uL (ref 0.7–4.0)
MCH: 33.3 pg (ref 26.0–34.0)
MCHC: 34.6 g/dL (ref 30.0–36.0)
MCV: 96.3 fL (ref 80.0–100.0)
Monocytes Absolute: 0.6 10*3/uL (ref 0.1–1.0)
Monocytes Relative: 12 %
Neutro Abs: 3.2 10*3/uL (ref 1.7–7.7)
Neutrophils Relative %: 67 %
Platelet Count: 90 10*3/uL — ABNORMAL LOW (ref 150–400)
RBC: 3.54 MIL/uL — ABNORMAL LOW (ref 3.87–5.11)
RDW: 14.1 % (ref 11.5–15.5)
WBC Count: 4.8 10*3/uL (ref 4.0–10.5)
nRBC: 0 % (ref 0.0–0.2)

## 2021-11-29 LAB — MAGNESIUM: Magnesium: 1.7 mg/dL (ref 1.7–2.4)

## 2021-11-29 MED ORDER — SODIUM CHLORIDE 0.9 % IV SOLN
300.0000 mg/m2 | Freq: Once | INTRAVENOUS | Status: DC
  Filled 2021-11-29: qty 31.1

## 2021-11-29 MED ORDER — METHYLPREDNISOLONE SODIUM SUCC 125 MG IJ SOLR
125.0000 mg | Freq: Once | INTRAMUSCULAR | Status: AC
  Administered 2021-11-29: 125 mg via INTRAVENOUS
  Filled 2021-11-29: qty 2

## 2021-11-29 MED ORDER — SODIUM CHLORIDE 0.9 % IV SOLN
300.0000 mg/m2 | Freq: Once | INTRAVENOUS | Status: AC
  Administered 2021-11-29: 622 mg via INTRAVENOUS
  Filled 2021-11-29: qty 31.1

## 2021-11-29 MED ORDER — ALBUTEROL SULFATE (2.5 MG/3ML) 0.083% IN NEBU: 2.5000 mg | INHALATION_SOLUTION | RESPIRATORY_TRACT | Status: DC | PRN

## 2021-11-29 MED ORDER — SODIUM CHLORIDE 0.9 % IV SOLN
1800.0000 mg/m2 | INTRAVENOUS | Status: DC
  Administered 2021-11-29: 3750 mg via INTRAVENOUS
  Filled 2021-11-29: qty 75

## 2021-11-29 MED ORDER — FAMOTIDINE IN NACL 20-0.9 MG/50ML-% IV SOLN
20.0000 mg | Freq: Once | INTRAVENOUS | Status: AC
  Administered 2021-11-29: 20 mg via INTRAVENOUS
  Filled 2021-11-29: qty 50

## 2021-11-29 MED ORDER — SODIUM CHLORIDE 0.9 % IV SOLN
6.0000 mg/kg | Freq: Once | INTRAVENOUS | Status: AC
  Administered 2021-11-29: 600 mg via INTRAVENOUS
  Filled 2021-11-29: qty 20

## 2021-11-29 MED ORDER — POTASSIUM CHLORIDE ER 10 MEQ PO CPCR: 10.0000 meq | ORAL_CAPSULE | Freq: Two times a day (BID) | ORAL | 2 refills | Status: DC

## 2021-11-29 MED ORDER — SODIUM CHLORIDE 0.9 % IV SOLN: Freq: Once | INTRAVENOUS | Status: AC

## 2021-11-29 MED ORDER — ALBUTEROL SULFATE 0.63 MG/3ML IN NEBU: 0.6300 mg | INHALATION_SOLUTION | Freq: Four times a day (QID) | RESPIRATORY_TRACT | Status: DC | PRN

## 2021-11-29 NOTE — Patient Instructions (Signed)
[facility]   Discharge Instructions: Thank you for choosing [facility] to provide your oncology and hematology care.   If you have a lab appointment with the [facility], please go directly to the  and check in at the registration area.   Wear comfortable clothing and clothing appropriate for easy access to any Portacath or PICC line.   We strive to give you quality time with your provider. You may need to reschedule your appointment if you arrive late (15 or more minutes).  Arriving late affects you and other patients whose appointments are after yours.  Also, if you miss three or more appointments without notifying the office, you may be dismissed from the clinic at the provider's discretion.      For prescription refill requests, have your pharmacy contact our office and allow 72 hours for refills to be completed.    Today you received the following chemotherapy and/or immunotherapy agents Panitumumab (VECTIBIX), Leucovorin & Flourouracil (ADRUCIL).      To help prevent nausea and vomiting after your treatment, we encourage you to take your nausea medication as directed.  BELOW ARE SYMPTOMS THAT SHOULD BE REPORTED IMMEDIATELY: *FEVER GREATER THAN 100.4 F (38 C) OR HIGHER *CHILLS OR SWEATING *NAUSEA AND VOMITING THAT IS NOT CONTROLLED WITH YOUR NAUSEA MEDICATION *UNUSUAL SHORTNESS OF BREATH *UNUSUAL BRUISING OR BLEEDING *URINARY PROBLEMS (pain or burning when urinating, or frequent urination) *BOWEL PROBLEMS (unusual diarrhea, constipation, pain near the anus) TENDERNESS IN MOUTH AND THROAT WITH OR WITHOUT PRESENCE OF ULCERS (sore throat, sores in mouth, or a toothache) UNUSUAL RASH, SWELLING OR PAIN  UNUSUAL VAGINAL DISCHARGE OR ITCHING   Items with * indicate a potential emergency and should be followed up as soon as possible or go to the Emergency Department if any problems should occur.  Please show the CHEMOTHERAPY ALERT  CARD or IMMUNOTHERAPY ALERT CARD at check-in to the Emergency Department and triage nurse.  Should you have questions after your visit or need to cancel or reschedule your appointment, please contact [facility]  Dept: [phone]  and follow the prompts.  Office hours are 8:00 a.m. to 4:30 p.m. Monday - Friday. Please note that voicemails left after 4:00 p.m. may not be returned until the following business day.  We are closed weekends and major holidays. You have access to a nurse at all times for urgent questions. Please call the main number to the clinic Dept: [phone] and follow the prompts.   For any non-urgent questions, you may also contact your provider using MyChart. We now offer e-Visits for anyone 85 and older to request care online for non-urgent symptoms. For details visit [email or web address].   Also download the MyChart app! Go to the app store, search "MyChart", open the app, select [facility], and log in with your MyChart username and password.  Masks are optional in the cancer centers. If you would like for your care team to wear a mask while they are taking care of you, please let them know. You may have one support person who is at least 73 years old accompany you for your appointments.  Panitumumab Injection What is this medication? PANITUMUMAB (pan i TOOM ue mab) treats colorectal cancer. It works by blocking a protein that causes cancer cells to grow and multiply. This helps to slow or stop the spread of cancer cells. It is a monoclonal antibody. This medicine may be used for other purposes; ask your  health care provider or pharmacist if you have questions. COMMON BRAND NAME(S): Vectibix What should I tell my care team before I take this medication? They need to know if you have any of these conditions: Eye disease Low levels of magnesium in the blood Lung disease An unusual or allergic reaction to panitumumab, other medications, foods,  dyes, or preservatives Pregnant or trying to get pregnant Breast-feeding How should I use this medication? This medication is injected into a vein. It is given by your care team in a hospital or clinic setting. Talk to your care team about the use of this medication in children. Special care may be needed. Overdosage: If you think you have taken too much of this medicine contact a poison control center or emergency room at once. NOTE: This medicine is only for you. Do not share this medicine with others. What if I miss a dose? Keep appointments for follow-up doses. It is important not to miss your dose. Call your care team if you are unable to keep an appointment. What may interact with this medication? Bevacizumab This list may not describe all possible interactions. Give your health care provider a list of all the medicines, herbs, non-prescription drugs, or dietary supplements you use. Also tell them if you smoke, drink alcohol, or use illegal drugs. Some items may interact with your medicine. What should I watch for while using this medication? Your condition will be monitored carefully while you are receiving this medication. This medication may make you feel generally unwell. This is not uncommon as chemotherapy can affect healthy cells as well as cancer cells. Report any side effects. Continue your course of treatment even though you feel ill unless your care team tells you to stop. This medication can make you more sensitive to the sun. Keep out of the sun while receiving this medication and for 2 months after stopping therapy. If you cannot avoid being in the sun, wear protective clothing and sunscreen. Do not use sun lamps, tanning beds, or tanning booths. Check with your care team if you have severe diarrhea, nausea, and vomiting or if you sweat a lot. The loss of too much body fluid may make it dangerous for you to take this medication. This medication may cause serious skin reactions.  They can happen weeks to months after starting the medication. Contact your care team right away if you notice fevers or flu-like symptoms with a rash. The rash may be red or purple and then turn into blisters or peeling of the skin. You may also notice a red rash with swelling of the face, lips, or lymph nodes in your neck or under your arms. Talk to your care team if you may be pregnant. Serious birth defects can occur if you take this medication during pregnancy and for 2 months after the last dose. Contraception is recommended while taking this medication and for 2 months after the last dose. Your care team can help you find the option that works for you. Do not breastfeed while taking this medication and for 2 months after the last dose. This medication may cause infertility. Talk to your care team if you are concerned about your fertility. What side effects may I notice from receiving this medication? Side effects that you should report to your care team as soon as possible: Allergic reactions--skin rash, itching, hives, swelling of the face, lips, tongue, or throat Dry cough, shortness of breath or trouble breathing Eye pain, redness, irritation, or discharge with blurry  or decreased vision Infusion reactions--chest pain, shortness of breath or trouble breathing, feeling faint or lightheaded Low magnesium level--muscle pain or cramps, unusual weakness or fatigue, fast or irregular heartbeat, tremors Low potassium level--muscle pain or cramps, unusual weakness or fatigue, fast or irregular heartbeat, constipation Redness, blistering, peeling, or loosening of the skin, including inside the mouth Skin reactions on sun-exposed areas Side effects that usually do not require medical attention (report to your care team if they continue or are bothersome): Change in nail shape, thickness, or color Diarrhea Dry skin Fatigue Nausea Vomiting This list may not describe all possible side effects. Call  your doctor for medical advice about side effects. You may report side effects to FDA at 1-800-FDA-1088. Where should I keep my medication? This medication is given in a hospital or clinic. It will not be stored at home. NOTE: This sheet is a summary. It may not cover all possible information. If you have questions about this medicine, talk to your doctor, pharmacist, or health care provider.  2023 Elsevier/Gold Standard (2021-06-01 00:00:00)  Leucovorin Injection What is this medication? LEUCOVORIN (loo koe VOR in) prevents side effects from certain medications, such as methotrexate. It works by increasing folate levels. This helps protect healthy cells in your body. It may also be used to treat anemia caused by low levels of folate. It can also be used with fluorouracil, a type of chemotherapy, to treat colorectal cancer. It works by increasing the effects of fluorouracil in the body. This medicine may be used for other purposes; ask your health care provider or pharmacist if you have questions. What should I tell my care team before I take this medication? They need to know if you have any of these conditions: Anemia from low levels of vitamin B12 in the blood An unusual or allergic reaction to leucovorin, folic acid, other medications, foods, dyes, or preservatives Pregnant or trying to get pregnant Breastfeeding How should I use this medication? This medication is injected into a vein or a muscle. It is given by your care team in a hospital or clinic setting. Talk to your care team about the use of this medication in children. Special care may be needed. Overdosage: If you think you have taken too much of this medicine contact a poison control center or emergency room at once. NOTE: This medicine is only for you. Do not share this medicine with others. What if I miss a dose? Keep appointments for follow-up doses. It is important not to miss your dose. Call your care team if you are unable  to keep an appointment. What may interact with this medication? Capecitabine Fluorouracil Phenobarbital Phenytoin Primidone Trimethoprim;sulfamethoxazole This list may not describe all possible interactions. Give your health care provider a list of all the medicines, herbs, non-prescription drugs, or dietary supplements you use. Also tell them if you smoke, drink alcohol, or use illegal drugs. Some items may interact with your medicine. What should I watch for while using this medication? Your condition will be monitored carefully while you are receiving this medication. This medication may increase the side effects of 5-fluorouracil. Tell your care team if you have diarrhea or mouth sores that do not get better or that get worse. What side effects may I notice from receiving this medication? Side effects that you should report to your care team as soon as possible: Allergic reactions--skin rash, itching, hives, swelling of the face, lips, tongue, or throat This list may not describe all possible side effects. Call  your doctor for medical advice about side effects. You may report side effects to FDA at 1-800-FDA-1088. Where should I keep my medication? This medication is given in a hospital or clinic. It will not be stored at home. NOTE: This sheet is a summary. It may not cover all possible information. If you have questions about this medicine, talk to your doctor, pharmacist, or health care provider.  2023 Elsevier/Gold Standard (2021-06-21 00:00:00)  Fluorouracil Injection What is this medication? FLUOROURACIL (flure oh YOOR a sil) treats some types of cancer. It works by slowing down the growth of cancer cells. This medicine may be used for other purposes; ask your health care provider or pharmacist if you have questions. COMMON BRAND NAME(S): Adrucil What should I tell my care team before I take this medication? They need to know if you have any of these conditions: Blood  disorders Dihydropyrimidine dehydrogenase (DPD) deficiency Infection, such as chickenpox, cold sores, herpes Kidney disease Liver disease Poor nutrition Recent or ongoing radiation therapy An unusual or allergic reaction to fluorouracil, other medications, foods, dyes, or preservatives If you or your partner are pregnant or trying to get pregnant Breast-feeding How should I use this medication? This medication is injected into a vein. It is administered by your care team in a hospital or clinic setting. Talk to your care team about the use of this medication in children. Special care may be needed. Overdosage: If you think you have taken too much of this medicine contact a poison control center or emergency room at once. NOTE: This medicine is only for you. Do not share this medicine with others. What if I miss a dose? Keep appointments for follow-up doses. It is important not to miss your dose. Call your care team if you are unable to keep an appointment. What may interact with this medication? Do not take this medication with any of the following: Live virus vaccines This medication may also interact with the following: Medications that treat or prevent blood clots, such as warfarin, enoxaparin, dalteparin This list may not describe all possible interactions. Give your health care provider a list of all the medicines, herbs, non-prescription drugs, or dietary supplements you use. Also tell them if you smoke, drink alcohol, or use illegal drugs. Some items may interact with your medicine. What should I watch for while using this medication? Your condition will be monitored carefully while you are receiving this medication. This medication may make you feel generally unwell. This is not uncommon as chemotherapy can affect healthy cells as well as cancer cells. Report any side effects. Continue your course of treatment even though you feel ill unless your care team tells you to stop. In some  cases, you may be given additional medications to help with side effects. Follow all directions for their use. This medication may increase your risk of getting an infection. Call your care team for advice if you get a fever, chills, sore throat, or other symptoms of a cold or flu. Do not treat yourself. Try to avoid being around people who are sick. This medication may increase your risk to bruise or bleed. Call your care team if you notice any unusual bleeding. Be careful brushing or flossing your teeth or using a toothpick because you may get an infection or bleed more easily. If you have any dental work done, tell your dentist you are receiving this medication. Avoid taking medications that contain aspirin, acetaminophen, ibuprofen, naproxen, or ketoprofen unless instructed by your care team.  These medications may hide a fever. Do not treat diarrhea with over the counter products. Contact your care team if you have diarrhea that lasts more than 2 days or if it is severe and watery. This medication can make you more sensitive to the sun. Keep out of the sun. If you cannot avoid being in the sun, wear protective clothing and sunscreen. Do not use sun lamps, tanning beds, or tanning booths. Talk to your care team if you or your partner wish to become pregnant or think you might be pregnant. This medication can cause serious birth defects if taken during pregnancy and for 3 months after the last dose. A reliable form of contraception is recommended while taking this medication and for 3 months after the last dose. Talk to your care team about effective forms of contraception. Do not father a child while taking this medication and for 3 months after the last dose. Use a condom while having sex during this time period. Do not breastfeed while taking this medication. This medication may cause infertility. Talk to your care team if you are concerned about your fertility. What side effects may I notice from  receiving this medication? Side effects that you should report to your care team as soon as possible: Allergic reactions--skin rash, itching, hives, swelling of the face, lips, tongue, or throat Heart attack--pain or tightness in the chest, shoulders, arms, or jaw, nausea, shortness of breath, cold or clammy skin, feeling faint or lightheaded Heart failure--shortness of breath, swelling of the ankles, feet, or hands, sudden weight gain, unusual weakness or fatigue Heart rhythm changes--fast or irregular heartbeat, dizziness, feeling faint or lightheaded, chest pain, trouble breathing High ammonia level--unusual weakness or fatigue, confusion, loss of appetite, nausea, vomiting, seizures Infection--fever, chills, cough, sore throat, wounds that don't heal, pain or trouble when passing urine, general feeling of discomfort or being unwell Low red blood cell level--unusual weakness or fatigue, dizziness, headache, trouble breathing Pain, tingling, or numbness in the hands or feet, muscle weakness, change in vision, confusion or trouble speaking, loss of balance or coordination, trouble walking, seizures Redness, swelling, and blistering of the skin over hands and feet Severe or prolonged diarrhea Unusual bruising or bleeding Side effects that usually do not require medical attention (report to your care team if they continue or are bothersome): Dry skin Headache Increased tears Nausea Pain, redness, or swelling with sores inside the mouth or throat Sensitivity to light Vomiting This list may not describe all possible side effects. Call your doctor for medical advice about side effects. You may report side effects to FDA at 1-800-FDA-1088. Where should I keep my medication? This medication is given in a hospital or clinic. It will not be stored at home. NOTE: This sheet is a summary. It may not cover all possible information. If you have questions about this medicine, talk to your doctor,  pharmacist, or health care provider.  2023 Elsevier/Gold Standard (2021-05-17 00:00:00)  The chemotherapy medication bag should finish at 46 hours, 96 hours, or 7 days. For example, if your pump is scheduled for 46 hours and it was put on at 4:00 p.m., it should finish at 2:00 p.m. the day it is scheduled to come off regardless of your appointment time.     Estimated time to finish at 1:30 p.m. on Thursday 12/01/2021.   If the display on your pump reads "Low Volume" and it is beeping, take the batteries out of the pump and come to the cancer center for  it to be taken off.   If the pump alarms go off prior to the pump reading "Low Volume" then call [phone] and someone can assist you.  If the plunger comes out and the chemotherapy medication is leaking out, please use your home chemo spill kit to clean up the spill. Do NOT use paper towels or other household products.  If you have problems or questions regarding your pump, please call either [phone] (24 hours a day) or the cancer center Monday-Friday 8:00 a.m.- 4:30 p.m. at the clinic number and we will assist you. If you are unable to get assistance, then go to the nearest Emergency Department and ask the staff to contact the IV team for assistance.

## 2021-11-29 NOTE — Progress Notes (Signed)
Patient seen by Dr. Benay Spice today  Vitals are within treatment parameters.  Labs reviewed by Dr. Benay Spice and are not all within treatment parameters. Platelets 90 K/uL and Potassium 3 mmol/L . Per MD Benay Spice, ok to treat.   Per physician team, patient is ready for treatment. Please note that modifications are being made to the treatment plan including added solu-medrol and pepcid dose as pre-medications .   Per MD Benay Spice, start Vectibix at 0.5 ordered rate for 30 minutes before proceeding with full rate if pt tolerates well. Per MD Benay Spice, have nebulizer tx ready at bedside for reaction PRN.

## 2021-11-29 NOTE — Progress Notes (Signed)
[place] OFFICE PROGRESS NOTE   Diagnosis: Rectal cancer  INTERVAL HISTORY:   Ms. Helm completed a cycle of 5-FU/panitumumab on 11/01/2021.  She developed a cough during the panitumumab infusion.  This improved with Solu-Medrol and albuterol nebulizer.  She developed a recurrent cough at the end of the panitumumab infusion and was treated with Pepcid. She reports feeling well.  She has a rash beneath her breast and the low abdomen pannus.  She has dry skin. She did not return as scheduled for cycle two 5-FU/panitumumab.  Objective:  Vital signs in last 24 hours:  Blood pressure (!) 160/79, pulse 87, temperature 98.2 F (36.8 C), temperature source Oral, resp. rate 18, height _0  (1.6 m), weight 209 lb 6.4 oz (95 kg), SpO2 96 %.    HEENT: No thrush or ulcers Resp: Decreased breath sounds at the right lower posterior chest, no respiratory distress Cardio: Regular rate and rhythm GI: No hepatosplenomegaly Vascular: No leg edema  Skin: Few pustular lesions over the face and inferior to the breast.  Yeast appearing rash at the abdominal pannus.  Portacath/PICC-without erythema  Lab Results:  Lab Results  Component Value Date   WBC 4.8 11/29/2021   HGB 11.8 (L) 11/29/2021   HCT 34.1 (L) 11/29/2021   MCV 96.3 11/29/2021   PLT 90 (L) 11/29/2021   NEUTROABS 3.2 11/29/2021    CMP  Lab Results  Component Value Date   NA 143 11/29/2021   K 3.0 (L) 11/29/2021   CL 109 11/29/2021   CO2 24 11/29/2021   GLUCOSE 91 11/29/2021   BUN 13 11/29/2021   CREATININE 0.69 11/29/2021   CALCIUM 8.6 (L) 11/29/2021   PROT 5.6 (L) 11/29/2021   ALBUMIN 3.3 (L) 11/29/2021   AST 37 11/29/2021   ALT 24 11/29/2021   ALKPHOS 86 11/29/2021   BILITOT 1.1 11/29/2021   GFRNONAA >60 11/29/2021   GFRAA >60 10/28/2019    Lab Results  Component Value Date   CEA1 <1.00 06/01/2020   CEA <1.00 06/01/2020    No results found for: "INR", "LABPROT"  Imaging:  No results  found.  Medications: I have reviewed the patient's current medications.   Assessment/Plan: Rectal cancer Mass at 7 cm from the anal verge on colonoscopy 08/09/2017, biopsy revealed invasive adenocarcinoma Staging CTs 08/17/2017-no evidence of metastatic disease, asymmetric thickening in the mid rectum MR pelvis 09/01/2017, T3N0 lesion beginning at 6.3 cm from the anal sphincter Radiation/Xeloda initiated 09/17/2017, completed 10/25/2017 Low anterior resection/diverting ileostomy 12/21/2017,ypT3,ypN1a tumor.  Lymphovascular invasion present, intact mismatch repair protein expression, treatment effect present (TRS 1).  Mismatch repair protein IHC normal; Foundation 1-KRAS/NRAS wild-type, microsatellite status and tumor mutational burden could not be determined. Cycle 1 adjuvant Xeloda beginning 01/21/2018 Cycle 2 adjuvant Xeloda beginning 02/11/2018 Xeloda discontinued after cycle 2 secondary to patient preference Ileostomy takedown 07/10/2018 CTs 09/07/2018- multiple live small pulmonary nodules concerning for metastatic disease CT chest 12/10/2018-multiple bilateral lung nodules, some have increased in size CT chest 04/08/2019-mild enlargement of bilateral lung nodules Status post SBRT multiple lung nodules 05/06/2019, 05/08/2019, 05/13/2019 CT chest 08/28/2019-improvement and resolution in majority of right-sided pulmonary nodules, a superior segment right lower lobe nodule has increased, no new right-sided nodules.  Progressive enlargement of left-sided pulmonary nodules PET scan 09/09/2019-hypermetabolic right lower lobe nodule, 2 hypermetabolic left upper lobe nodules with an additional 0.7 cm left upper lobe nodule below PET resolution, groundglass opacity in the mid to right lower lobe with associated hypermetabolism consistent with postradiation change, hypermetabolic  central segment 4A liver lesion Cycle 1 FOLFOX 10/14/2019 Cycle  2FOLFOX 11/11/2019, 5-FU and oxaliplatin dose reduced secondary to  neutropenia and thrombocytopenia following cycle one, G-CSF declined Cycle 3 FOLFOX 11/26/2019 Cycle 4 FOLFOX 12/11/2019, oxaliplatin held due to neutropenia and thrombocytopenia Cycle 5 FOLFOX 12/31/2019 Cycle 6 FOLFOX 01/14/2020 (oxaliplatin held, 5-fluorouracil dose reduced due to mucositis) CT chest 01/27/2020-improvement in left lung nodules, progressive airspace disease with traction bronchiectasis throughout the right lung, previously noted right lung nodules are obscured, mildly enlarged right paratracheal lymph nodes-not hypermetabolic on prior PET, likely reactive Cycle 7 FOLFOX 01/28/2020 Cycle 8 FOLFOX 02/11/2020 (oxaliplatin held due to neutropenia and thrombocytopenia) Cycle 9 FOLFOX 02/25/2020 (oxaliplatin held due to thrombocytopenia) Cycle 10 FOLFOX 03/11/2020 (oxaliplatin held secondary to neuropathy) Cycle 11 FOLFOX 03/25/2020 (oxaliplatin held due to neuropathy) CT chest 04/05/2020-no new or progressive findings.  Interval evolution of presumed postradiation scarring in the right perihilar lung with decrease in the more diffuse groundglass opacity seen previously.  No substantial change in left lung nodules. 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  No definite CT findings to suggest recurrent tumor.  Stable small left upper lobe pulmonary nodules.  No new or progressive findings.  Stable small right paratracheal lymph nodes.  Stable area of irregular low-attenuation in hepatic segment 4A, site of known prior hepatic metastatic lesion.  No new or progressive findings. Cycle 18 5-fluorouracil 07/06/2020 Cycle 19 5-fluorouracil 07/20/2020 Cycle 20 5-fluorouracil 08/03/2020 Cycle 21 5-fluorouracil  08/17/2020  Cycle 22 5-fluorouracil 08/31/2020 Cycle 23 5-fluorouracil 09/14/2020 CT chest 09/24/2020-increased number and size of pulmonary nodules bilaterally.  Findings in the right upper lobe are concerning for potential lymphangitic spread of tumor.  Right upper lobe findings could also reflect evolving postradiation change. Cycle 1 FOLFIRI 10/12/2020 Cycle 2 FOLFIRI 11/08/2020, irinotecan dose reduced due to neutropenia and thrombocytopenia following cycle 1 Cycle 3 FOLFIRI 12/01/2020 Cycle 4 FOLFIRI 12/22/2020 CT chest 01/11/2021-similar bilateral pulmonary nodules.  Left upper lobe nodule has mildly decreased in size.  No new nodules. Cycle 5 FOLFIRI 01/12/2021 Cycle 6 FOLFIRI 02/03/2021 Cycle 7 FOLFIRI 03/01/2021 Cycle 8 FOLFIRI 03/22/2021 CT chest 04/08/2021-multiple small bilateral lung nodules not significantly changed.  No new nodules.  Unchanged enlarged pretracheal nodes.  Increase in fibrotic consolidation of the perihilar and inferior right lung. Cycle 9 FOLFIRI 04/12/2021 05/04/2021 treatment held due to fatigue Cycle 10 FOLFIRI 05/18/2021 06/07/2021-treatment held secondary to neutropenia Cycle 11 FOLFIRI 06/14/2021 Cycle 12 FOLFIRI 07/05/2021 Cycle 13 FOLFIRI 07/27/2021, irinotecan held 14 FOLFIRI 08/16/2021, irinotecan held CT chest 09/02/2021-enlargement of several lung nodules, no new nodules, stable postradiation changes in the right lower lobe and right middle lobe, gastroesophageal varices identified Cycle one 5-FU/Panitumumab 11/01/2021 Cycle two 5-FU/panitumumab 11/29/2021-Solu-Medrol and Pepcid prophylaxis added after she developed a cough during the panitumumab with cycle 1       2.   Hypothyroid   3.    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  Port-A-Cath placement 09/29/2019, interventional radiology   5.  Neutropenia and thrombocytopenia following cycle 1 FOLFOX-plan chemotherapy dose reductions, she declined G-CSF   6.  Mucositis  following cycle 5 FOLFOX, 5-fluorouracil dose reduced with cycle 6 Mucositis following cycle 22 5-fluorouracil-Magic mouthwash added   7.  Right lung airspace disease/volume loss-likely toxicity from chest  radiation, trial of prednisone 01/29/2020; dyspnea and cough improved 02/11/2020 Progressive cough 03/11/2020-prednisone resumed at a dose of 20 mg daily Cough and dyspnea improved 03/25/2020-prednisone taper to 15 mg daily Improved 04/06/2020-prednisone taper to 10 mg daily Stable 04/21/2020-prednisone taper to 5 mg daily   8.  Gastroesophageal varices       Disposition: Samantha Lutz appears stable.  She has completed 1 cycle of 5-FU/panitumumab.  She developed a cough during the panitumumab infusion.  The cough improved with Solu-Medrol, Pepcid, and an albuterol nebulizer treatment.  She will be premedicated with Solu-Medrol and Pepcid with cycle 2.  She will receive an albuterol nebulizer as needed.  She did not develop dyspnea or other symptoms of an allergic reaction.  She agrees to proceed with cycle 2 today.  Ms. Duman will return for an office visit and 5-FU/panitumumab in 2 weeks.  Betsy Coder, MD  11/29/2021  11:42 AM

## 2021-11-30 ENCOUNTER — Other Ambulatory Visit: Payer: Self-pay

## 2021-12-01 ENCOUNTER — Inpatient Hospital Stay: Payer: Medicare Other | Attending: Oncology

## 2021-12-01 VITALS — BP 146/63 | HR 83 | Temp 97.8°F | Resp 18

## 2021-12-01 DIAGNOSIS — D696 Thrombocytopenia, unspecified: Secondary | ICD-10-CM | POA: Insufficient documentation

## 2021-12-01 DIAGNOSIS — C20 Malignant neoplasm of rectum: Secondary | ICD-10-CM | POA: Insufficient documentation

## 2021-12-01 DIAGNOSIS — Z5111 Encounter for antineoplastic chemotherapy: Secondary | ICD-10-CM | POA: Diagnosis not present

## 2021-12-01 DIAGNOSIS — R918 Other nonspecific abnormal finding of lung field: Secondary | ICD-10-CM | POA: Insufficient documentation

## 2021-12-01 MED ORDER — HEPARIN SOD (PORK) LOCK FLUSH 100 UNIT/ML IV SOLN
500.0000 [IU] | Freq: Once | INTRAVENOUS | Status: AC | PRN
  Administered 2021-12-01: 500 [IU]

## 2021-12-01 MED ORDER — SODIUM CHLORIDE 0.9% FLUSH
10.0000 mL | INTRAVENOUS | Status: DC | PRN
  Administered 2021-12-01: 10 mL

## 2021-12-01 NOTE — Patient Instructions (Signed)

## 2021-12-05 ENCOUNTER — Telehealth: Payer: Self-pay

## 2021-12-05 NOTE — Telephone Encounter (Signed)
TC from Mrs Goins, she states the rash beneath  her breast and the low abdomen  is  itchy and hurting. Its driving her crazy. Per Benay Spice  she can try Cortizone, if the Cortizone doesn't help give [place] a call back. Patient gave verbal understanding and had no further questions to concerns.

## 2021-12-11 ENCOUNTER — Other Ambulatory Visit: Payer: Self-pay | Admitting: Oncology

## 2021-12-12 ENCOUNTER — Encounter: Payer: Self-pay | Admitting: Nurse Practitioner

## 2021-12-12 ENCOUNTER — Inpatient Hospital Stay: Payer: Medicare Other | Admitting: Nurse Practitioner

## 2021-12-12 ENCOUNTER — Inpatient Hospital Stay: Payer: Medicare Other

## 2021-12-12 VITALS — BP 139/90 | HR 94 | Temp 98.1°F | Resp 18 | Ht 63.0 in | Wt 204.8 lb

## 2021-12-12 VITALS — BP 143/62 | HR 80

## 2021-12-12 DIAGNOSIS — C20 Malignant neoplasm of rectum: Secondary | ICD-10-CM

## 2021-12-12 DIAGNOSIS — Z5111 Encounter for antineoplastic chemotherapy: Secondary | ICD-10-CM | POA: Diagnosis not present

## 2021-12-12 LAB — CMP (CANCER CENTER ONLY)
ALT: 32 U/L (ref 0–44)
AST: 49 U/L — ABNORMAL HIGH (ref 15–41)
Albumin: 3.4 g/dL — ABNORMAL LOW (ref 3.5–5.0)
Alkaline Phosphatase: 92 U/L (ref 38–126)
Anion gap: 9 (ref 5–15)
BUN: 16 mg/dL (ref 8–23)
CO2: 23 mmol/L (ref 22–32)
Calcium: 8.5 mg/dL — ABNORMAL LOW (ref 8.9–10.3)
Chloride: 108 mmol/L (ref 98–111)
Creatinine: 0.75 mg/dL (ref 0.44–1.00)
GFR, Estimated: >60 mL/min (ref >60)
Glucose, Bld: 122 mg/dL — ABNORMAL HIGH (ref 70–99)
Potassium: 3.6 mmol/L (ref 3.5–5.1)
Sodium: 140 mmol/L (ref 135–145)
Total Bilirubin: 1.2 mg/dL (ref 0.3–1.2)
Total Protein: 5.8 g/dL — ABNORMAL LOW (ref 6.5–8.1)

## 2021-12-12 LAB — CBC WITH DIFFERENTIAL (CANCER CENTER ONLY)
Abs Immature Granulocytes: 0.02 10*3/uL (ref 0.00–0.07)
Basophils Absolute: 0 10*3/uL (ref 0.0–0.1)
Basophils Relative: 0 %
Eosinophils Absolute: 0.3 10*3/uL (ref 0.0–0.5)
Eosinophils Relative: 5 %
HCT: 36.8 % (ref 36.0–46.0)
Hemoglobin: 12.5 g/dL (ref 12.0–15.0)
Immature Granulocytes: 0 %
Lymphocytes Relative: 17 %
Lymphs Abs: 1 10*3/uL (ref 0.7–4.0)
MCH: 33.2 pg (ref 26.0–34.0)
MCHC: 34 g/dL (ref 30.0–36.0)
MCV: 97.6 fL (ref 80.0–100.0)
Monocytes Absolute: 0.7 10*3/uL (ref 0.1–1.0)
Monocytes Relative: 11 %
Neutro Abs: 4.1 10*3/uL (ref 1.7–7.7)
Neutrophils Relative %: 67 %
Platelet Count: 120 10*3/uL — ABNORMAL LOW (ref 150–400)
RBC: 3.77 MIL/uL — ABNORMAL LOW (ref 3.87–5.11)
RDW: 14.9 % (ref 11.5–15.5)
WBC Count: 6.1 10*3/uL (ref 4.0–10.5)
nRBC: 0 % (ref 0.0–0.2)

## 2021-12-12 LAB — MAGNESIUM: Magnesium: 1.8 mg/dL (ref 1.7–2.4)

## 2021-12-12 MED ORDER — NYSTATIN 100000 UNIT/GM EX POWD: 1.0000 | Freq: Three times a day (TID) | CUTANEOUS | 0 refills | Status: DC

## 2021-12-12 MED ORDER — ALBUTEROL SULFATE HFA 108 (90 BASE) MCG/ACT IN AERS
2.0000 | INHALATION_SPRAY | Freq: Once | RESPIRATORY_TRACT | Status: DC | PRN
  Filled 2021-12-12: qty 6.7

## 2021-12-12 MED ORDER — FAMOTIDINE IN NACL 20-0.9 MG/50ML-% IV SOLN
20.0000 mg | Freq: Once | INTRAVENOUS | Status: AC
  Administered 2021-12-12: 20 mg via INTRAVENOUS
  Filled 2021-12-12: qty 50

## 2021-12-12 MED ORDER — SODIUM CHLORIDE 0.9 % IV SOLN
300.0000 mg/m2 | Freq: Once | INTRAVENOUS | Status: AC
  Administered 2021-12-12: 622 mg via INTRAVENOUS
  Filled 2021-12-12: qty 31.1

## 2021-12-12 MED ORDER — SODIUM CHLORIDE 0.9 % IV SOLN: Freq: Once | INTRAVENOUS | Status: AC

## 2021-12-12 MED ORDER — METHYLPREDNISOLONE SODIUM SUCC 125 MG IJ SOLR
125.0000 mg | Freq: Once | INTRAMUSCULAR | Status: AC
  Administered 2021-12-12: 125 mg via INTRAVENOUS
  Filled 2021-12-12: qty 2

## 2021-12-12 MED ORDER — SODIUM CHLORIDE 0.9 % IV SOLN
6.0000 mg/kg | Freq: Once | INTRAVENOUS | Status: AC
  Administered 2021-12-12: 600 mg via INTRAVENOUS
  Filled 2021-12-12: qty 10

## 2021-12-12 MED ORDER — SODIUM CHLORIDE 0.9 % IV SOLN
1800.0000 mg/m2 | INTRAVENOUS | Status: DC
  Administered 2021-12-12: 3750 mg via INTRAVENOUS
  Filled 2021-12-12: qty 75

## 2021-12-12 MED ORDER — LIDOCAINE-PRILOCAINE 2.5-2.5 % EX CREA: 1.0000 | TOPICAL_CREAM | CUTANEOUS | 2 refills | Status: DC | PRN

## 2021-12-12 NOTE — Progress Notes (Signed)
Per Leander Rams, NP ok to restart Vectibix infusion if symptoms have resolved.

## 2021-12-12 NOTE — Progress Notes (Signed)
Pt completed Vectibix infusion without further difficulty

## 2021-12-12 NOTE — Progress Notes (Signed)
[facility] OFF[facility] PROGRESS NOTE   Diagnosis: Rectal cancer  INTERVAL HISTORY:   Samantha Lutz returns as scheduled.  She completed another cycle of 5-FU/Panitumumab 11/29/2021.  She tolerated the infusion well.  No dyspnea, cough, wheezing.  She denies nausea/vomiting.  No mouth sores.  No diarrhea.  She continues to have an itchy rash under both breast and abdominal pannus.  Before beginning Panitumumab she noted her vision was "cloudy".  This has increased.  She no longer drives at nighttime.  She denies diplopia.  No eye pain.  No erythema or drainage.  Objective:  Vital signs in last 24 hours:  Blood pressure (!) 139/90, pulse 94, temperature 98.1 F (36.7 C), temperature source Oral, resp. rate 18, height _0  (1.6 m), weight 204 lb 12.8 oz (92.9 kg), SpO2 98 %.    HEENT: No thrush or ulcers. Resp: Lungs clear bilaterally.  Diminished breath sounds right lower lung field.  No respiratory distress. Cardio: Regular rate and rhythm. GI: Abdomen soft and nontender.  No hepatosplenomegaly. Vascular: No leg edema. Neuro: Alert and oriented. Skin: Yeast appearing rash at the abdominal pannus.  Erythematous rash beneath and between the breasts, not typical yeast appearing rash.   Lab Results:  Lab Results  Component Value Date   WBC 6.1 12/12/2021   HGB 12.5 12/12/2021   HCT 36.8 12/12/2021   MCV 97.6 12/12/2021   PLT 120 (L) 12/12/2021   NEUTROABS 4.1 12/12/2021    Imaging:  No results found.  Medications: I have reviewed the patient's current medications.  Assessment/Plan: Rectal cancer Mass at 7 cm from the anal verge on colonoscopy 08/09/2017, biopsy revealed invasive adenocarcinoma Staging CTs 08/17/2017-no evidence of metastatic disease, asymmetric thickening in the mid rectum MR pelvis 09/01/2017, T3N0 lesion beginning at 6.3 cm from the anal sphincter Radiation/Xeloda initiated 09/17/2017, completed 10/25/2017 Low anterior resection/diverting  ileostomy 12/21/2017,ypT3,ypN1a tumor.  Lymphovascular invasion present, intact mismatch repair protein expression, treatment effect present (TRS 1).  Mismatch repair protein IHC normal; Foundation 1-KRAS/NRAS wild-type, microsatellite status and tumor mutational burden could not be determined. Cycle 1 adjuvant Xeloda beginning 01/21/2018 Cycle 2 adjuvant Xeloda beginning 02/11/2018 Xeloda discontinued after cycle 2 secondary to patient preference Ileostomy takedown 07/10/2018 CTs 09/07/2018- multiple live small pulmonary nodules concerning for metastatic disease CT chest 12/10/2018-multiple bilateral lung nodules, some have increased in size CT chest 04/08/2019-mild enlargement of bilateral lung nodules Status post SBRT multiple lung nodules 05/06/2019, 05/08/2019, 05/13/2019 CT chest 08/28/2019-improvement and resolution in majority of right-sided pulmonary nodules, a superior segment right lower lobe nodule has increased, no new right-sided nodules.  Progressive enlargement of left-sided pulmonary nodules PET scan 09/09/2019-hypermetabolic right lower lobe nodule, 2 hypermetabolic left upper lobe nodules with an additional 0.7 cm left upper lobe nodule below PET resolution, groundglass opacity in the mid to right lower lobe with associated hypermetabolism consistent with postradiation change, hypermetabolic central segment 4A liver lesion Cycle 1 FOLFOX 10/14/2019 Cycle  2FOLFOX 11/11/2019, 5-FU and oxaliplatin dose reduced secondary to neutropenia and thrombocytopenia following cycle one, G-CSF declined Cycle 3 FOLFOX 11/26/2019 Cycle 4 FOLFOX 12/11/2019, oxaliplatin held due to neutropenia and thrombocytopenia Cycle 5 FOLFOX 12/31/2019 Cycle 6 FOLFOX 01/14/2020 (oxaliplatin held, 5-fluorouracil dose reduced due to mucositis) CT chest 01/27/2020-improvement in left lung nodules, progressive airspace disease with traction bronchiectasis throughout the right lung, previously noted right lung nodules are  obscured, mildly enlarged right paratracheal lymph nodes-not hypermetabolic on prior PET, likely reactive Cycle 7 FOLFOX 01/28/2020 Cycle 8 FOLFOX 02/11/2020 (oxaliplatin held due  to neutropenia and thrombocytopenia) Cycle 9 FOLFOX 02/25/2020 (oxaliplatin held due to thrombocytopenia) Cycle 10 FOLFOX 03/11/2020 (oxaliplatin held secondary to neuropathy) Cycle 11 FOLFOX 03/25/2020 (oxaliplatin held due to neuropathy) CT chest 04/05/2020-no new or progressive findings.  Interval evolution of presumed postradiation scarring in the right perihilar lung with decrease in the more diffuse groundglass opacity seen previously.  No substantial change in left lung nodules. 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  No definite CT findings to suggest recurrent tumor.  Stable small left upper lobe pulmonary nodules.  No new or progressive findings.  Stable small right paratracheal lymph nodes.  Stable area of irregular low-attenuation in hepatic segment 4A, site of known prior hepatic metastatic lesion.  No new or progressive findings. Cycle 18 5-fluorouracil 07/06/2020 Cycle 19 5-fluorouracil 07/20/2020 Cycle 20 5-fluorouracil 08/03/2020 Cycle 21 5-fluorouracil 08/17/2020  Cycle 22 5-fluorouracil 08/31/2020 Cycle 23 5-fluorouracil 09/14/2020 CT chest 09/24/2020-increased number and size of pulmonary nodules bilaterally.  Findings in the right upper lobe are concerning for potential lymphangitic spread of tumor.  Right upper lobe findings could also reflect evolving postradiation change. Cycle 1 FOLFIRI 10/12/2020 Cycle 2 FOLFIRI 11/08/2020, irinotecan dose reduced due to neutropenia and thrombocytopenia following cycle 1 Cycle 3  FOLFIRI 12/01/2020 Cycle 4 FOLFIRI 12/22/2020 CT chest 01/11/2021-similar bilateral pulmonary nodules.  Left upper lobe nodule has mildly decreased in size.  No new nodules. Cycle 5 FOLFIRI 01/12/2021 Cycle 6 FOLFIRI 02/03/2021 Cycle 7 FOLFIRI 03/01/2021 Cycle 8 FOLFIRI 03/22/2021 CT chest 04/08/2021-multiple small bilateral lung nodules not significantly changed.  No new nodules.  Unchanged enlarged pretracheal nodes.  Increase in fibrotic consolidation of the perihilar and inferior right lung. Cycle 9 FOLFIRI 04/12/2021 05/04/2021 treatment held due to fatigue Cycle 10 FOLFIRI 05/18/2021 06/07/2021-treatment held secondary to neutropenia Cycle 11 FOLFIRI 06/14/2021 Cycle 12 FOLFIRI 07/05/2021 Cycle 13 FOLFIRI 07/27/2021, irinotecan held 14 FOLFIRI 08/16/2021, irinotecan held CT chest 09/02/2021-enlargement of several lung nodules, no new nodules, stable postradiation changes in the right lower lobe and right middle lobe, gastroesophageal varices identified Cycle one 5-FU/Panitumumab 11/01/2021 Cycle two 5-FU/panitumumab 11/29/2021-Solu-Medrol and Pepcid prophylaxis added after she developed a cough during the panitumumab with cycle 1       2.   Hypothyroid   3.    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  Port-A-Cath placement 09/29/2019, interventional radiology   5.  Neutropenia and thrombocytopenia following cycle 1 FOLFOX-plan chemotherapy dose reductions, she declined G-CSF   6.  Mucositis following cycle 5 FOLFOX, 5-fluorouracil dose reduced with cycle 6 Mucositis following cycle 22 5-fluorouracil-Magic mouthwash added   7.  Right lung airspace disease/volume loss-likely toxicity from chest radiation, trial of prednisone 01/29/2020; dyspnea and cough improved 02/11/2020 Progressive cough 03/11/2020-prednisone resumed at a dose of 20 mg daily Cough and dyspnea improved 03/25/2020-prednisone taper to 15 mg daily Improved 04/06/2020-prednisone taper to 10 mg  daily Stable 04/21/2020-prednisone taper to 5 mg daily   8.  Gastroesophageal varices   Disposition: Ms. Mitschke appears stable.  She has completed 2 cycles of 5-FU/Panitumumab.  She tolerated cycle 2 well, specifically no cough, shortness of breath or wheezing.  Plan to proceed with cycle 3 today as scheduled.  CBC and chemistry panel reviewed.  Labs adequate to proceed as above.  The abdominal pannus  skin rash has the appearance of a yeast rash.  The rash beneath the breasts does not have the typical appearance of a yeast rash and also does not have the appearance of a typical Panitumumab rash.  She will treat both areas with nystatin powder.  She notes progressive vision changes.  She will see her ophthalmologist prior to the next office visit.  She will return for lab, follow-up, 5-FU/Panitumumab in 2 weeks.  She will contact the office in the interim with any problems.    Ned Card ANP/GNP-BC   12/12/2021  10:37 AM

## 2021-12-12 NOTE — Progress Notes (Signed)
Pt is having a weird sensation in th center of her chest. Coughed a couple of times with small amount of phlegm. "Like breathing in cold air" O2 is 100% HR 83 BP 152/66. I paused the infusion at 1:42 symptoms are subsiding with pause. She has about 10 minutes left on her infusion. Ned Card, NP notified.

## 2021-12-12 NOTE — Patient Instructions (Signed)
[facility]   The chemotherapy medication bag should finish at 46 hours, 96 hours, or 7 days. For example, if your pump is scheduled for 46 hours and it was put on at 4:00 p.m., it should finish at 2:00 p.m. the day it is scheduled to come off regardless of your appointment time.     Estimated time to finish at 1:15 Wednesday, December 14, 2021.   If the display on your pump reads "Low Volume" and it is beeping, take the batteries out of the pump and come to the cancer center for it to be taken off.   If the pump alarms go off prior to the pump reading "Low Volume" then call [phone] and someone can assist you.  If the plunger comes out and the chemotherapy medication is leaking out, please use your home chemo spill kit to clean up the spill. Do NOT use paper towels or other household products.  If you have problems or questions regarding your pump, please call either [phone] (24 hours a day) or the cancer center Monday-Friday 8:00 a.m.- 4:30 p.m. at the clinic number and we will assist you. If you are unable to get assistance, then go to the nearest Emergency Department and ask the staff to contact the IV team for assistance.  Discharge Instructions: Thank you for choosing [facility] to provide your oncology and hematology care.   If you have a lab appointment with the [facility], please go directly to the [facility] and check in at the registration area.   Wear comfortable clothing and clothing appropriate for easy access to any Portacath or PICC line.   We strive to give you quality time with your provider. You may need to reschedule your appointment if you arrive late (15 or more minutes).  Arriving late affects you and other patients whose appointments are after yours.  Also, if you miss three or more appointments without notifying the office, you may be dismissed from the clinic at the provider's discretion.      For  prescription refill requests, have your pharmacy contact our office and allow 72 hours for refills to be completed.    Today you received the following chemotherapy and/or immunotherapy agents Vectibix, Leucovorin, Fluorouracil.      To help prevent nausea and vomiting after your treatment, we encourage you to take your nausea medication as directed.  BELOW ARE SYMPTOMS THAT SHOULD BE REPORTED IMMEDIATELY: *FEVER GREATER THAN 100.4 F (38 C) OR HIGHER *CHILLS OR SWEATING *NAUSEA AND VOMITING THAT IS NOT CONTROLLED WITH YOUR NAUSEA MEDICATION *UNUSUAL SHORTNESS OF BREATH *UNUSUAL BRUISING OR BLEEDING *URINARY PROBLEMS (pain or burning when urinating, or frequent urination) *BOWEL PROBLEMS (unusual diarrhea, constipation, pain near the anus) TENDERNESS IN MOUTH AND THROAT WITH OR WITHOUT PRESENCE OF ULCERS (sore throat, sores in mouth, or a toothache) UNUSUAL RASH, SWELLING OR PAIN  UNUSUAL VAGINAL DISCHARGE OR ITCHING   Items with * indicate a potential emergency and should be followed up as soon as possible or go to the Emergency Department if any problems should occur.  Please show the CHEMOTHERAPY ALERT CARD or IMMUNOTHERAPY ALERT CARD at check-in to the Emergency Department and triage nurse.  Should you have questions after your visit or need to cancel or reschedule your appointment, please contact [facility]  Dept: [phone]  and follow the prompts.  Office hours are 8:00 a.m. to 4:30 p.m. Monday - Friday. Please note that voicemails  left after 4:00 p.m. may not be returned until the following business day.  We are closed weekends and major holidays. You have access to a nurse at all times for urgent questions. Please call the main number to the clinic Dept: [phone] and follow the prompts.   For any non-urgent questions, you may also contact your provider using MyChart. We now offer e-Visits for anyone 75 and older to request care online for  non-urgent symptoms. For details visit [email or web address].   Also download the MyChart app! Go to the app store, search "MyChart", open the app, select [facility], and log in with your MyChart username and password.  Masks are optional in the cancer centers. If you would like for your care team to wear a mask while they are taking care of you, please let them know. You may have one support person who is at least 73 years old accompany you for your appointments.  Panitumumab Injection What is this medication? PANITUMUMAB (pan i TOOM ue mab) treats colorectal cancer. It works by blocking a protein that causes cancer cells to grow and multiply. This helps to slow or stop the spread of cancer cells. It is a monoclonal antibody. This medicine may be used for other purposes; ask your health care provider or pharmacist if you have questions. COMMON BRAND NAME(S): Vectibix What should I tell my care team before I take this medication? They need to know if you have any of these conditions: Eye disease Low levels of magnesium in the blood Lung disease An unusual or allergic reaction to panitumumab, other medications, foods, dyes, or preservatives Pregnant or trying to get pregnant Breast-feeding How should I use this medication? This medication is injected into a vein. It is given by your care team in a hospital or clinic setting. Talk to your care team about the use of this medication in children. Special care may be needed. Overdosage: If you think you have taken too much of this medicine contact a poison control center or emergency room at once. NOTE: This medicine is only for you. Do not share this medicine with others. What if I miss a dose? Keep appointments for follow-up doses. It is important not to miss your dose. Call your care team if you are unable to keep an appointment. What may interact with this medication? Bevacizumab This list may not describe all possible interactions. Give  your health care provider a list of all the medicines, herbs, non-prescription drugs, or dietary supplements you use. Also tell them if you smoke, drink alcohol, or use illegal drugs. Some items may interact with your medicine. What should I watch for while using this medication? Your condition will be monitored carefully while you are receiving this medication. This medication may make you feel generally unwell. This is not uncommon as chemotherapy can affect healthy cells as well as cancer cells. Report any side effects. Continue your course of treatment even though you feel ill unless your care team tells you to stop. This medication can make you more sensitive to the sun. Keep out of the sun while receiving this medication and for 2 months after stopping therapy. If you cannot avoid being in the sun, wear protective clothing and sunscreen. Do not use sun lamps, tanning beds, or tanning booths. Check with your care team if you have severe diarrhea, nausea, and vomiting or if you sweat a lot. The loss of too much body fluid may make it dangerous for you to take this medication. This  medication may cause serious skin reactions. They can happen weeks to months after starting the medication. Contact your care team right away if you notice fevers or flu-like symptoms with a rash. The rash may be red or purple and then turn into blisters or peeling of the skin. You may also notice a red rash with swelling of the face, lips, or lymph nodes in your neck or under your arms. Talk to your care team if you may be pregnant. Serious birth defects can occur if you take this medication during pregnancy and for 2 months after the last dose. Contraception is recommended while taking this medication and for 2 months after the last dose. Your care team can help you find the option that works for you. Do not breastfeed while taking this medication and for 2 months after the last dose. This medication may cause infertility.  Talk to your care team if you are concerned about your fertility. What side effects may I notice from receiving this medication? Side effects that you should report to your care team as soon as possible: Allergic reactions--skin rash, itching, hives, swelling of the face, lips, tongue, or throat Dry cough, shortness of breath or trouble breathing Eye pain, redness, irritation, or discharge with blurry or decreased vision Infusion reactions--chest pain, shortness of breath or trouble breathing, feeling faint or lightheaded Low magnesium level--muscle pain or cramps, unusual weakness or fatigue, fast or irregular heartbeat, tremors Low potassium level--muscle pain or cramps, unusual weakness or fatigue, fast or irregular heartbeat, constipation Redness, blistering, peeling, or loosening of the skin, including inside the mouth Skin reactions on sun-exposed areas Side effects that usually do not require medical attention (report to your care team if they continue or are bothersome): Change in nail shape, thickness, or color Diarrhea Dry skin Fatigue Nausea Vomiting This list may not describe all possible side effects. Call your doctor for medical advice about side effects. You may report side effects to FDA at 1-800-FDA-1088. Where should I keep my medication? This medication is given in a hospital or clinic. It will not be stored at home. NOTE: This sheet is a summary. It may not cover all possible information. If you have questions about this medicine, talk to your doctor, pharmacist, or health care provider.  2023 Elsevier/[employer] (2021-06-01 00:00:00)  Leucovorin Injection What is this medication? LEUCOVORIN (loo koe VOR in) prevents side effects from certain medications, such as methotrexate. It works by increasing folate levels. This helps protect healthy cells in your body. It may also be used to treat anemia caused by low levels of folate. It can also be used with fluorouracil, a  type of chemotherapy, to treat colorectal cancer. It works by increasing the effects of fluorouracil in the body. This medicine may be used for other purposes; ask your health care provider or pharmacist if you have questions. What should I tell my care team before I take this medication? They need to know if you have any of these conditions: Anemia from low levels of vitamin B12 in the blood An unusual or allergic reaction to leucovorin, folic acid, other medications, foods, dyes, or preservatives Pregnant or trying to get pregnant Breastfeeding How should I use this medication? This medication is injected into a vein or a muscle. It is given by your care team in a hospital or clinic setting. Talk to your care team about the use of this medication in children. Special care may be needed. Overdosage: If you think you have taken  too much of this medicine contact a poison control center or emergency room at once. NOTE: This medicine is only for you. Do not share this medicine with others. What if I miss a dose? Keep appointments for follow-up doses. It is important not to miss your dose. Call your care team if you are unable to keep an appointment. What may interact with this medication? Capecitabine Fluorouracil Phenobarbital Phenytoin Primidone Trimethoprim;sulfamethoxazole This list may not describe all possible interactions. Give your health care provider a list of all the medicines, herbs, non-prescription drugs, or dietary supplements you use. Also tell them if you smoke, drink alcohol, or use illegal drugs. Some items may interact with your medicine. What should I watch for while using this medication? Your condition will be monitored carefully while you are receiving this medication. This medication may increase the side effects of 5-fluorouracil. Tell your care team if you have diarrhea or mouth sores that do not get better or that get worse. What side effects may I notice from  receiving this medication? Side effects that you should report to your care team as soon as possible: Allergic reactions--skin rash, itching, hives, swelling of the face, lips, tongue, or throat This list may not describe all possible side effects. Call your doctor for medical advice about side effects. You may report side effects to FDA at 1-800-FDA-1088. Where should I keep my medication? This medication is given in a hospital or clinic. It will not be stored at home. NOTE: This sheet is a summary. It may not cover all possible information. If you have questions about this medicine, talk to your doctor, pharmacist, or health care provider.  2023 Elsevier/Gold Standard (2021-06-21 00:00:00)  Fluorouracil Injection What is this medication? FLUOROURACIL (flure oh YOOR a sil) treats some types of cancer. It works by slowing down the growth of cancer cells. This medicine may be used for other purposes; ask your health care provider or pharmacist if you have questions. COMMON BRAND NAME(S): Adrucil What should I tell my care team before I take this medication? They need to know if you have any of these conditions: Blood disorders Dihydropyrimidine dehydrogenase (DPD) deficiency Infection, such as chickenpox, cold sores, herpes Kidney disease Liver disease Poor nutrition Recent or ongoing radiation therapy An unusual or allergic reaction to fluorouracil, other medications, foods, dyes, or preservatives If you or your partner are pregnant or trying to get pregnant Breast-feeding How should I use this medication? This medication is injected into a vein. It is administered by your care team in a hospital or clinic setting. Talk to your care team about the use of this medication in children. Special care may be needed. Overdosage: If you think you have taken too much of this medicine contact a poison control center or emergency room at once. NOTE: This medicine is only for you. Do not share  this medicine with others. What if I miss a dose? Keep appointments for follow-up doses. It is important not to miss your dose. Call your care team if you are unable to keep an appointment. What may interact with this medication? Do not take this medication with any of the following: Live virus vaccines This medication may also interact with the following: Medications that treat or prevent blood clots, such as warfarin, enoxaparin, dalteparin This list may not describe all possible interactions. Give your health care provider a list of all the medicines, herbs, non-prescription drugs, or dietary supplements you use. Also tell them if you smoke, drink alcohol,  or use illegal drugs. Some items may interact with your medicine. What should I watch for while using this medication? Your condition will be monitored carefully while you are receiving this medication. This medication may make you feel generally unwell. This is not uncommon as chemotherapy can affect healthy cells as well as cancer cells. Report any side effects. Continue your course of treatment even though you feel ill unless your care team tells you to stop. In some cases, you may be given additional medications to help with side effects. Follow all directions for their use. This medication may increase your risk of getting an infection. Call your care team for advice if you get a fever, chills, sore throat, or other symptoms of a cold or flu. Do not treat yourself. Try to avoid being around people who are sick. This medication may increase your risk to bruise or bleed. Call your care team if you notice any unusual bleeding. Be careful brushing or flossing your teeth or using a toothpick because you may get an infection or bleed more easily. If you have any dental work done, tell your dentist you are receiving this medication. Avoid taking medications that contain aspirin, acetaminophen, ibuprofen, naproxen, or ketoprofen unless instructed  by your care team. These medications may hide a fever. Do not treat diarrhea with over the counter products. Contact your care team if you have diarrhea that lasts more than 2 days or if it is severe and watery. This medication can make you more sensitive to the sun. Keep out of the sun. If you cannot avoid being in the sun, wear protective clothing and sunscreen. Do not use sun lamps, tanning beds, or tanning booths. Talk to your care team if you or your partner wish to become pregnant or think you might be pregnant. This medication can cause serious birth defects if taken during pregnancy and for 3 months after the last dose. A reliable form of contraception is recommended while taking this medication and for 3 months after the last dose. Talk to your care team about effective forms of contraception. Do not father a child while taking this medication and for 3 months after the last dose. Use a condom while having sex during this time period. Do not breastfeed while taking this medication. This medication may cause infertility. Talk to your care team if you are concerned about your fertility. What side effects may I notice from receiving this medication? Side effects that you should report to your care team as soon as possible: Allergic reactions--skin rash, itching, hives, swelling of the face, lips, tongue, or throat Heart attack--pain or tightness in the chest, shoulders, arms, or jaw, nausea, shortness of breath, cold or clammy skin, feeling faint or lightheaded Heart failure--shortness of breath, swelling of the ankles, feet, or hands, sudden weight gain, unusual weakness or fatigue Heart rhythm changes--fast or irregular heartbeat, dizziness, feeling faint or lightheaded, chest pain, trouble breathing High ammonia level--unusual weakness or fatigue, confusion, loss of appetite, nausea, vomiting, seizures Infection--fever, chills, cough, sore throat, wounds that don't heal, pain or trouble when  passing urine, general feeling of discomfort or being unwell Low red blood cell level--unusual weakness or fatigue, dizziness, headache, trouble breathing Pain, tingling, or numbness in the hands or feet, muscle weakness, change in vision, confusion or trouble speaking, loss of balance or coordination, trouble walking, seizures Redness, swelling, and blistering of the skin over hands and feet Severe or prolonged diarrhea Unusual bruising or bleeding Side effects that usually do  not require medical attention (report to your care team if they continue or are bothersome): Dry skin Headache Increased tears Nausea Pain, redness, or swelling with sores inside the mouth or throat Sensitivity to light Vomiting This list may not describe all possible side effects. Call your doctor for medical advice about side effects. You may report side effects to FDA at 1-800-FDA-1088. Where should I keep my medication? This medication is given in a hospital or clinic. It will not be stored at home. NOTE: This sheet is a summary. It may not cover all possible information. If you have questions about this medicine, talk to your doctor, pharmacist, or health care provider.  2023 Elsevier/[employer] Standard (2021-05-17 00:00:00)

## 2021-12-12 NOTE — Progress Notes (Signed)
Patient seen by Lisa Thomas NP today  Vitals are within treatment parameters.  Labs reviewed by Lisa Thomas NP and are within treatment parameters.  Per physician team, patient is ready for treatment and there are NO modifications to the treatment plan.     

## 2021-12-14 ENCOUNTER — Inpatient Hospital Stay: Payer: Medicare Other

## 2021-12-14 VITALS — BP 130/57 | HR 91 | Temp 98.0°F | Resp 18

## 2021-12-14 DIAGNOSIS — Z5111 Encounter for antineoplastic chemotherapy: Secondary | ICD-10-CM | POA: Diagnosis not present

## 2021-12-14 DIAGNOSIS — C20 Malignant neoplasm of rectum: Secondary | ICD-10-CM

## 2021-12-14 MED ORDER — HEPARIN SOD (PORK) LOCK FLUSH 100 UNIT/ML IV SOLN
500.0000 [IU] | Freq: Once | INTRAVENOUS | Status: AC | PRN
  Administered 2021-12-14: 500 [IU]

## 2021-12-14 MED ORDER — SODIUM CHLORIDE 0.9% FLUSH
10.0000 mL | INTRAVENOUS | Status: DC | PRN
  Administered 2021-12-14: 10 mL

## 2021-12-14 NOTE — Patient Instructions (Signed)

## 2021-12-25 ENCOUNTER — Other Ambulatory Visit: Payer: Self-pay | Admitting: Oncology

## 2021-12-27 ENCOUNTER — Inpatient Hospital Stay: Payer: Medicare Other

## 2021-12-27 ENCOUNTER — Encounter: Payer: Self-pay | Admitting: *Deleted

## 2021-12-27 ENCOUNTER — Inpatient Hospital Stay (HOSPITAL_BASED_OUTPATIENT_CLINIC_OR_DEPARTMENT_OTHER): Payer: Medicare Other | Admitting: Oncology

## 2021-12-27 VITALS — BP 148/81 | HR 90 | Temp 97.9°F | Resp 18 | Ht 63.0 in | Wt 206.2 lb

## 2021-12-27 DIAGNOSIS — C20 Malignant neoplasm of rectum: Secondary | ICD-10-CM

## 2021-12-27 DIAGNOSIS — Z5111 Encounter for antineoplastic chemotherapy: Secondary | ICD-10-CM | POA: Diagnosis not present

## 2021-12-27 LAB — CBC WITH DIFFERENTIAL (CANCER CENTER ONLY)
Abs Immature Granulocytes: 0.01 10*3/uL (ref 0.00–0.07)
Basophils Absolute: 0 10*3/uL (ref 0.0–0.1)
Basophils Relative: 1 %
Eosinophils Absolute: 0.3 10*3/uL (ref 0.0–0.5)
Eosinophils Relative: 6 %
HCT: 35.3 % — ABNORMAL LOW (ref 36.0–46.0)
Hemoglobin: 12 g/dL (ref 12.0–15.0)
Immature Granulocytes: 0 %
Lymphocytes Relative: 19 %
Lymphs Abs: 0.8 10*3/uL (ref 0.7–4.0)
MCH: 33.1 pg (ref 26.0–34.0)
MCHC: 34 g/dL (ref 30.0–36.0)
MCV: 97.5 fL (ref 80.0–100.0)
Monocytes Absolute: 0.7 10*3/uL (ref 0.1–1.0)
Monocytes Relative: 17 %
Neutro Abs: 2.5 10*3/uL (ref 1.7–7.7)
Neutrophils Relative %: 57 %
Platelet Count: 84 10*3/uL — ABNORMAL LOW (ref 150–400)
RBC: 3.62 MIL/uL — ABNORMAL LOW (ref 3.87–5.11)
RDW: 15.8 % — ABNORMAL HIGH (ref 11.5–15.5)
WBC Count: 4.3 10*3/uL (ref 4.0–10.5)
nRBC: 0 % (ref 0.0–0.2)

## 2021-12-27 LAB — CMP (CANCER CENTER ONLY)
ALT: 30 U/L (ref 0–44)
AST: 43 U/L — ABNORMAL HIGH (ref 15–41)
Albumin: 3.2 g/dL — ABNORMAL LOW (ref 3.5–5.0)
Alkaline Phosphatase: 87 U/L (ref 38–126)
Anion gap: 9 (ref 5–15)
BUN: 9 mg/dL (ref 8–23)
CO2: 23 mmol/L (ref 22–32)
Calcium: 8.4 mg/dL — ABNORMAL LOW (ref 8.9–10.3)
Chloride: 112 mmol/L — ABNORMAL HIGH (ref 98–111)
Creatinine: 0.68 mg/dL (ref 0.44–1.00)
GFR, Estimated: >60 mL/min (ref >60)
Glucose, Bld: 106 mg/dL — ABNORMAL HIGH (ref 70–99)
Potassium: 3.2 mmol/L — ABNORMAL LOW (ref 3.5–5.1)
Sodium: 144 mmol/L (ref 135–145)
Total Bilirubin: 1.2 mg/dL (ref 0.3–1.2)
Total Protein: 5.5 g/dL — ABNORMAL LOW (ref 6.5–8.1)

## 2021-12-27 LAB — MAGNESIUM: Magnesium: 1.4 mg/dL — ABNORMAL LOW (ref 1.7–2.4)

## 2021-12-27 MED ORDER — SODIUM CHLORIDE 0.9 % IV SOLN: Freq: Once | INTRAVENOUS | Status: AC

## 2021-12-27 MED ORDER — SODIUM CHLORIDE 0.9 % IV SOLN
6.0000 mg/kg | Freq: Once | INTRAVENOUS | Status: AC
  Administered 2021-12-27: 600 mg via INTRAVENOUS
  Filled 2021-12-27: qty 20

## 2021-12-27 MED ORDER — METHYLPREDNISOLONE SODIUM SUCC 125 MG IJ SOLR
125.0000 mg | Freq: Once | INTRAMUSCULAR | Status: AC
  Administered 2021-12-27: 125 mg via INTRAVENOUS
  Filled 2021-12-27: qty 2

## 2021-12-27 MED ORDER — SODIUM CHLORIDE 0.9 % IV SOLN
1800.0000 mg/m2 | INTRAVENOUS | Status: DC
  Administered 2021-12-27: 3750 mg via INTRAVENOUS
  Filled 2021-12-27: qty 75

## 2021-12-27 MED ORDER — MAGNESIUM SULFATE 4 GM/100ML IV SOLN
4.0000 g | Freq: Once | INTRAVENOUS | Status: AC
  Administered 2021-12-27: 4 g via INTRAVENOUS
  Filled 2021-12-27: qty 100

## 2021-12-27 MED ORDER — FAMOTIDINE IN NACL 20-0.9 MG/50ML-% IV SOLN
20.0000 mg | Freq: Once | INTRAVENOUS | Status: AC
  Administered 2021-12-27: 20 mg via INTRAVENOUS
  Filled 2021-12-27: qty 50

## 2021-12-27 MED ORDER — POTASSIUM CHLORIDE CRYS ER 20 MEQ PO TBCR
40.0000 meq | EXTENDED_RELEASE_TABLET | Freq: Once | ORAL | Status: AC
  Administered 2021-12-27: 40 meq via ORAL
  Filled 2021-12-27: qty 2

## 2021-12-27 MED ORDER — SODIUM CHLORIDE 0.9% FLUSH: 10.0000 mL | INTRAVENOUS | Status: DC | PRN

## 2021-12-27 MED ORDER — SODIUM CHLORIDE 0.9 % IV SOLN
300.0000 mg/m2 | Freq: Once | INTRAVENOUS | Status: AC
  Administered 2021-12-27: 622 mg via INTRAVENOUS
  Filled 2021-12-27: qty 31.1

## 2021-12-27 NOTE — Progress Notes (Signed)
Patient seen by Dr. Benay Spice today  Vitals are within treatment parameters. No intervention for BP 148/81  Labs reviewed by Dr. Benay Spice and are not all within treatment parameters. Platelets 84,000--OK to treat;  Mg+ 1.4  Per physician team, patient is ready for treatment. Please note that modifications are being made to the treatment plan including Give 4 grams Mg+ today and 40 meq oral KCl today. Continue 20 meq daily at home.

## 2021-12-27 NOTE — Patient Instructions (Signed)
[facility]   The chemotherapy medication bag should finish at 46 hours, 96 hours, or 7 days. For example, if your pump is scheduled for 46 hours and it was put on at 4:00 p.m., it should finish at 2:00 p.m. the day it is scheduled to come off regardless of your appointment time.     Estimated time to finish at 2pm Thursday December 29, 2021.   If the display on your pump reads "Low Volume" and it is beeping, take the batteries out of the pump and come to the cancer center for it to be taken off.   If the pump alarms go off prior to the pump reading "Low Volume" then call [phone] and someone can assist you.  If the plunger comes out and the chemotherapy medication is leaking out, please use your home chemo spill kit to clean up the spill. Do NOT use paper towels or other household products.  If you have problems or questions regarding your pump, please call either [phone] (24 hours a day) or the cancer center Monday-Friday 8:00 a.m.- 4:30 p.m. at the clinic number and we will assist you. If you are unable to get assistance, then go to the nearest Emergency Department and ask the staff to contact the IV team for assistance.  Discharge Instructions: Thank you for choosing [facility] to provide your oncology and hematology care.   If you have a lab appointment with the [facility], please go directly to the [facility] and check in at the registration area.   Wear comfortable clothing and clothing appropriate for easy access to any Portacath or PICC line.   We strive to give you quality time with your provider. You may need to reschedule your appointment if you arrive late (15 or more minutes).  Arriving late affects you and other patients whose appointments are after yours.  Also, if you miss three or more appointments without notifying the office, you may be dismissed from the clinic at the provider's discretion.      For prescription  refill requests, have your pharmacy contact our office and allow 72 hours for refills to be completed.    Today you received the following chemotherapy and/or immunotherapy agents Vectibix, Leucovorin, Fluorouracil.      To help prevent nausea and vomiting after your treatment, we encourage you to take your nausea medication as directed.  BELOW ARE SYMPTOMS THAT SHOULD BE REPORTED IMMEDIATELY: *FEVER GREATER THAN 100.4 F (38 C) OR HIGHER *CHILLS OR SWEATING *NAUSEA AND VOMITING THAT IS NOT CONTROLLED WITH YOUR NAUSEA MEDICATION *UNUSUAL SHORTNESS OF BREATH *UNUSUAL BRUISING OR BLEEDING *URINARY PROBLEMS (pain or burning when urinating, or frequent urination) *BOWEL PROBLEMS (unusual diarrhea, constipation, pain near the anus) TENDERNESS IN MOUTH AND THROAT WITH OR WITHOUT PRESENCE OF ULCERS (sore throat, sores in mouth, or a toothache) UNUSUAL RASH, SWELLING OR PAIN  UNUSUAL VAGINAL DISCHARGE OR ITCHING   Items with * indicate a potential emergency and should be followed up as soon as possible or go to the Emergency Department if any problems should occur.  Please show the CHEMOTHERAPY ALERT CARD or IMMUNOTHERAPY ALERT CARD at check-in to the Emergency Department and triage nurse.  Should you have questions after your visit or need to cancel or reschedule your appointment, please contact [facility]  Dept: [phone]  and follow the prompts.  Office hours are 8:00 a.m. to 4:30 p.m. Monday - Friday. Please note that voicemails  left after 4:00 p.m. may not be returned until the following business day.  We are closed weekends and major holidays. You have access to a nurse at all times for urgent questions. Please call the main number to the clinic Dept: [phone] and follow the prompts.   For any non-urgent questions, you may also contact your provider using MyChart. We now offer e-Visits for anyone 36 and older to request care online for non-urgent symptoms.  For details visit [email or web address].   Also download the MyChart app! Go to the app store, search "MyChart", open the app, select [facility], and log in with your MyChart username and password.  Masks are optional in the cancer centers. If you would like for your care team to wear a mask while they are taking care of you, please let them know. You may have one support person who is at least 73 years old accompany you for your appointments.  Panitumumab Injection What is this medication? PANITUMUMAB (pan i TOOM ue mab) treats colorectal cancer. It works by blocking a protein that causes cancer cells to grow and multiply. This helps to slow or stop the spread of cancer cells. It is a monoclonal antibody. This medicine may be used for other purposes; ask your health care provider or pharmacist if you have questions. COMMON BRAND NAME(S): Vectibix What should I tell my care team before I take this medication? They need to know if you have any of these conditions: Eye disease Low levels of magnesium in the blood Lung disease An unusual or allergic reaction to panitumumab, other medications, foods, dyes, or preservatives Pregnant or trying to get pregnant Breast-feeding How should I use this medication? This medication is injected into a vein. It is given by your care team in a hospital or clinic setting. Talk to your care team about the use of this medication in children. Special care may be needed. Overdosage: If you think you have taken too much of this medicine contact a poison control center or emergency room at once. NOTE: This medicine is only for you. Do not share this medicine with others. What if I miss a dose? Keep appointments for follow-up doses. It is important not to miss your dose. Call your care team if you are unable to keep an appointment. What may interact with this medication? Bevacizumab This list may not describe all possible interactions. Give your health care  provider a list of all the medicines, herbs, non-prescription drugs, or dietary supplements you use. Also tell them if you smoke, drink alcohol, or use illegal drugs. Some items may interact with your medicine. What should I watch for while using this medication? Your condition will be monitored carefully while you are receiving this medication. This medication may make you feel generally unwell. This is not uncommon as chemotherapy can affect healthy cells as well as cancer cells. Report any side effects. Continue your course of treatment even though you feel ill unless your care team tells you to stop. This medication can make you more sensitive to the sun. Keep out of the sun while receiving this medication and for 2 months after stopping therapy. If you cannot avoid being in the sun, wear protective clothing and sunscreen. Do not use sun lamps, tanning beds, or tanning booths. Check with your care team if you have severe diarrhea, nausea, and vomiting or if you sweat a lot. The loss of too much body fluid may make it dangerous for you to take this medication. This  medication may cause serious skin reactions. They can happen weeks to months after starting the medication. Contact your care team right away if you notice fevers or flu-like symptoms with a rash. The rash may be red or purple and then turn into blisters or peeling of the skin. You may also notice a red rash with swelling of the face, lips, or lymph nodes in your neck or under your arms. Talk to your care team if you may be pregnant. Serious birth defects can occur if you take this medication during pregnancy and for 2 months after the last dose. Contraception is recommended while taking this medication and for 2 months after the last dose. Your care team can help you find the option that works for you. Do not breastfeed while taking this medication and for 2 months after the last dose. This medication may cause infertility. Talk to your care  team if you are concerned about your fertility. What side effects may I notice from receiving this medication? Side effects that you should report to your care team as soon as possible: Allergic reactions--skin rash, itching, hives, swelling of the face, lips, tongue, or throat Dry cough, shortness of breath or trouble breathing Eye pain, redness, irritation, or discharge with blurry or decreased vision Infusion reactions--chest pain, shortness of breath or trouble breathing, feeling faint or lightheaded Low magnesium level--muscle pain or cramps, unusual weakness or fatigue, fast or irregular heartbeat, tremors Low potassium level--muscle pain or cramps, unusual weakness or fatigue, fast or irregular heartbeat, constipation Redness, blistering, peeling, or loosening of the skin, including inside the mouth Skin reactions on sun-exposed areas Side effects that usually do not require medical attention (report to your care team if they continue or are bothersome): Change in nail shape, thickness, or color Diarrhea Dry skin Fatigue Nausea Vomiting This list may not describe all possible side effects. Call your doctor for medical advice about side effects. You may report side effects to FDA at 1-800-FDA-1088. Where should I keep my medication? This medication is given in a hospital or clinic. It will not be stored at home. NOTE: This sheet is a summary. It may not cover all possible information. If you have questions about this medicine, talk to your doctor, pharmacist, or health care provider.  2023 Elsevier/Gold Standard (2021-06-01 00:00:00)  Leucovorin Injection What is this medication? LEUCOVORIN (loo koe VOR in) prevents side effects from certain medications, such as methotrexate. It works by increasing folate levels. This helps protect healthy cells in your body. It may also be used to treat anemia caused by low levels of folate. It can also be used with fluorouracil, a type of  chemotherapy, to treat colorectal cancer. It works by increasing the effects of fluorouracil in the body. This medicine may be used for other purposes; ask your health care provider or pharmacist if you have questions. What should I tell my care team before I take this medication? They need to know if you have any of these conditions: Anemia from low levels of vitamin B12 in the blood An unusual or allergic reaction to leucovorin, folic acid, other medications, foods, dyes, or preservatives Pregnant or trying to get pregnant Breastfeeding How should I use this medication? This medication is injected into a vein or a muscle. It is given by your care team in a hospital or clinic setting. Talk to your care team about the use of this medication in children. Special care may be needed. Overdosage: If you think you have taken  too much of this medicine contact a poison control center or emergency room at once. NOTE: This medicine is only for you. Do not share this medicine with others. What if I miss a dose? Keep appointments for follow-up doses. It is important not to miss your dose. Call your care team if you are unable to keep an appointment. What may interact with this medication? Capecitabine Fluorouracil Phenobarbital Phenytoin Primidone Trimethoprim;sulfamethoxazole This list may not describe all possible interactions. Give your health care provider a list of all the medicines, herbs, non-prescription drugs, or dietary supplements you use. Also tell them if you smoke, drink alcohol, or use illegal drugs. Some items may interact with your medicine. What should I watch for while using this medication? Your condition will be monitored carefully while you are receiving this medication. This medication may increase the side effects of 5-fluorouracil. Tell your care team if you have diarrhea or mouth sores that do not get better or that get worse. What side effects may I notice from receiving  this medication? Side effects that you should report to your care team as soon as possible: Allergic reactions--skin rash, itching, hives, swelling of the face, lips, tongue, or throat This list may not describe all possible side effects. Call your doctor for medical advice about side effects. You may report side effects to FDA at 1-800-FDA-1088. Where should I keep my medication? This medication is given in a hospital or clinic. It will not be stored at home. NOTE: This sheet is a summary. It may not cover all possible information. If you have questions about this medicine, talk to your doctor, pharmacist, or health care provider.  2023 Elsevier/Gold Standard (2021-06-21 00:00:00)  Fluorouracil Injection What is this medication? FLUOROURACIL (flure oh YOOR a sil) treats some types of cancer. It works by slowing down the growth of cancer cells. This medicine may be used for other purposes; ask your health care provider or pharmacist if you have questions. COMMON BRAND NAME(S): Adrucil What should I tell my care team before I take this medication? They need to know if you have any of these conditions: Blood disorders Dihydropyrimidine dehydrogenase (DPD) deficiency Infection, such as chickenpox, cold sores, herpes Kidney disease Liver disease Poor nutrition Recent or ongoing radiation therapy An unusual or allergic reaction to fluorouracil, other medications, foods, dyes, or preservatives If you or your partner are pregnant or trying to get pregnant Breast-feeding How should I use this medication? This medication is injected into a vein. It is administered by your care team in a hospital or clinic setting. Talk to your care team about the use of this medication in children. Special care may be needed. Overdosage: If you think you have taken too much of this medicine contact a poison control center or emergency room at once. NOTE: This medicine is only for you. Do not share this medicine  with others. What if I miss a dose? Keep appointments for follow-up doses. It is important not to miss your dose. Call your care team if you are unable to keep an appointment. What may interact with this medication? Do not take this medication with any of the following: Live virus vaccines This medication may also interact with the following: Medications that treat or prevent blood clots, such as warfarin, enoxaparin, dalteparin This list may not describe all possible interactions. Give your health care provider a list of all the medicines, herbs, non-prescription drugs, or dietary supplements you use. Also tell them if you smoke, drink alcohol,  or use illegal drugs. Some items may interact with your medicine. What should I watch for while using this medication? Your condition will be monitored carefully while you are receiving this medication. This medication may make you feel generally unwell. This is not uncommon as chemotherapy can affect healthy cells as well as cancer cells. Report any side effects. Continue your course of treatment even though you feel ill unless your care team tells you to stop. In some cases, you may be given additional medications to help with side effects. Follow all directions for their use. This medication may increase your risk of getting an infection. Call your care team for advice if you get a fever, chills, sore throat, or other symptoms of a cold or flu. Do not treat yourself. Try to avoid being around people who are sick. This medication may increase your risk to bruise or bleed. Call your care team if you notice any unusual bleeding. Be careful brushing or flossing your teeth or using a toothpick because you may get an infection or bleed more easily. If you have any dental work done, tell your dentist you are receiving this medication. Avoid taking medications that contain aspirin, acetaminophen, ibuprofen, naproxen, or ketoprofen unless instructed by your care  team. These medications may hide a fever. Do not treat diarrhea with over the counter products. Contact your care team if you have diarrhea that lasts more than 2 days or if it is severe and watery. This medication can make you more sensitive to the sun. Keep out of the sun. If you cannot avoid being in the sun, wear protective clothing and sunscreen. Do not use sun lamps, tanning beds, or tanning booths. Talk to your care team if you or your partner wish to become pregnant or think you might be pregnant. This medication can cause serious birth defects if taken during pregnancy and for 3 months after the last dose. A reliable form of contraception is recommended while taking this medication and for 3 months after the last dose. Talk to your care team about effective forms of contraception. Do not father a child while taking this medication and for 3 months after the last dose. Use a condom while having sex during this time period. Do not breastfeed while taking this medication. This medication may cause infertility. Talk to your care team if you are concerned about your fertility. What side effects may I notice from receiving this medication? Side effects that you should report to your care team as soon as possible: Allergic reactions--skin rash, itching, hives, swelling of the face, lips, tongue, or throat Heart attack--pain or tightness in the chest, shoulders, arms, or jaw, nausea, shortness of breath, cold or clammy skin, feeling faint or lightheaded Heart failure--shortness of breath, swelling of the ankles, feet, or hands, sudden weight gain, unusual weakness or fatigue Heart rhythm changes--fast or irregular heartbeat, dizziness, feeling faint or lightheaded, chest pain, trouble breathing High ammonia level--unusual weakness or fatigue, confusion, loss of appetite, nausea, vomiting, seizures Infection--fever, chills, cough, sore throat, wounds that don't heal, pain or trouble when passing urine,  general feeling of discomfort or being unwell Low red blood cell level--unusual weakness or fatigue, dizziness, headache, trouble breathing Pain, tingling, or numbness in the hands or feet, muscle weakness, change in vision, confusion or trouble speaking, loss of balance or coordination, trouble walking, seizures Redness, swelling, and blistering of the skin over hands and feet Severe or prolonged diarrhea Unusual bruising or bleeding Side effects that usually do  not require medical attention (report to your care team if they continue or are bothersome): Dry skin Headache Increased tears Nausea Pain, redness, or swelling with sores inside the mouth or throat Sensitivity to light Vomiting This list may not describe all possible side effects. Call your doctor for medical advice about side effects. You may report side effects to FDA at 1-800-FDA-1088. Where should I keep my medication? This medication is given in a hospital or clinic. It will not be stored at home. NOTE: This sheet is a summary. It may not cover all possible information. If you have questions about this medicine, talk to your doctor, pharmacist, or health care provider.  2023 Elsevier/Gold Standard (2021-05-17 00:00:00)

## 2021-12-27 NOTE — Progress Notes (Signed)
[place] OFFICE PROGRESS NOTE   Diagnosis: Rectal cancer  INTERVAL HISTORY:   Samantha Lutz returns as scheduled.  She completed another cycle of 5-FU/panitumumab on 12/12/2021.  She had a mild cough during the panitumumab infusion.  This resolved with holding the panitumumab infusion.  She was able to complete the infusion.  No diarrhea.  She reports diffuse dryness of the skin.  She has paronychia at the hands.  The skin rash under the breast has improved.  Stable exertional dyspnea.  He saw ophthalmology and reports she will need bilateral cataract surgery next month.  Objective:  Vital signs in last 24 hours:  Blood pressure (!) 148/81, pulse 90, temperature 97.9 F (36.6 C), temperature source Oral, resp. rate 18, height _0  (1.6 m), weight 206 lb 3.2 oz (93.5 kg), SpO2 98 %.    HEENT: No thrush or ulcers Resp: Decreased breath sounds at the right lower posterior chest, no respiratory distress Cardio: Regular rate and rhythm GI: No hepatosplenomegaly Vascular: No leg edema Skin: Diffuse dryness, mild acne type rash over the chest, paronychia at several fingers bilaterally, ulcerated rash under the left greater than right breast  Portacath/PICC-without erythema  Lab Results:  Lab Results  Component Value Date   WBC 4.3 12/27/2021   HGB 12.0 12/27/2021   HCT 35.3 (L) 12/27/2021   MCV 97.5 12/27/2021   PLT 84 (L) 12/27/2021   NEUTROABS 2.5 12/27/2021    CMP  Lab Results  Component Value Date   NA 140 12/12/2021   K 3.6 12/12/2021   CL 108 12/12/2021   CO2 23 12/12/2021   GLUCOSE 122 (H) 12/12/2021   BUN 16 12/12/2021   CREATININE 0.75 12/12/2021   CALCIUM 8.5 (L) 12/12/2021   PROT 5.8 (L) 12/12/2021   ALBUMIN 3.4 (L) 12/12/2021   AST 49 (H) 12/12/2021   ALT 32 12/12/2021   ALKPHOS 92 12/12/2021   BILITOT 1.2 12/12/2021   GFRNONAA >60 12/12/2021   GFRAA >60 10/28/2019    Lab Results  Component Value Date   CEA1 <1.00 06/01/2020    CEA <1.00 06/01/2020   Medications: I have reviewed the patient's current medications.   Assessment/Plan: Rectal cancer Mass at 7 cm from the anal verge on colonoscopy 08/09/2017, biopsy revealed invasive adenocarcinoma Staging CTs 08/17/2017-no evidence of metastatic disease, asymmetric thickening in the mid rectum MR pelvis 09/01/2017, T3N0 lesion beginning at 6.3 cm from the anal sphincter Radiation/Xeloda initiated 09/17/2017, completed 10/25/2017 Low anterior resection/diverting ileostomy 12/21/2017,ypT3,ypN1a tumor.  Lymphovascular invasion present, intact mismatch repair protein expression, treatment effect present (TRS 1).  Mismatch repair protein IHC normal; Foundation 1-KRAS/NRAS wild-type, microsatellite status and tumor mutational burden could not be determined. Cycle 1 adjuvant Xeloda beginning 01/21/2018 Cycle 2 adjuvant Xeloda beginning 02/11/2018 Xeloda discontinued after cycle 2 secondary to patient preference Ileostomy takedown 07/10/2018 CTs 09/07/2018- multiple live small pulmonary nodules concerning for metastatic disease CT chest 12/10/2018-multiple bilateral lung nodules, some have increased in size CT chest 04/08/2019-mild enlargement of bilateral lung nodules Status post SBRT multiple lung nodules 05/06/2019, 05/08/2019, 05/13/2019 CT chest 08/28/2019-improvement and resolution in majority of right-sided pulmonary nodules, a superior segment right lower lobe nodule has increased, no new right-sided nodules.  Progressive enlargement of left-sided pulmonary nodules PET scan 09/09/2019-hypermetabolic right lower lobe nodule, 2 hypermetabolic left upper lobe nodules with an additional 0.7 cm left upper lobe nodule below PET resolution, groundglass opacity in the mid to right lower lobe with associated hypermetabolism consistent with postradiation change, hypermetabolic central segment  4A liver lesion Cycle 1 FOLFOX 10/14/2019 Cycle  2FOLFOX 11/11/2019, 5-FU and oxaliplatin dose reduced  secondary to neutropenia and thrombocytopenia following cycle one, G-CSF declined Cycle 3 FOLFOX 11/26/2019 Cycle 4 FOLFOX 12/11/2019, oxaliplatin held due to neutropenia and thrombocytopenia Cycle 5 FOLFOX 12/31/2019 Cycle 6 FOLFOX 01/14/2020 (oxaliplatin held, 5-fluorouracil dose reduced due to mucositis) CT chest 01/27/2020-improvement in left lung nodules, progressive airspace disease with traction bronchiectasis throughout the right lung, previously noted right lung nodules are obscured, mildly enlarged right paratracheal lymph nodes-not hypermetabolic on prior PET, likely reactive Cycle 7 FOLFOX 01/28/2020 Cycle 8 FOLFOX 02/11/2020 (oxaliplatin held due to neutropenia and thrombocytopenia) Cycle 9 FOLFOX 02/25/2020 (oxaliplatin held due to thrombocytopenia) Cycle 10 FOLFOX 03/11/2020 (oxaliplatin held secondary to neuropathy) Cycle 11 FOLFOX 03/25/2020 (oxaliplatin held due to neuropathy) CT chest 04/05/2020-no new or progressive findings.  Interval evolution of presumed postradiation scarring in the right perihilar lung with decrease in the more diffuse groundglass opacity seen previously.  No substantial change in left lung nodules. 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  No definite CT findings to suggest recurrent tumor.  Stable small left upper lobe pulmonary nodules.  No new or progressive findings.  Stable small right paratracheal lymph nodes.  Stable area of irregular low-attenuation in hepatic segment 4A, site of known prior hepatic metastatic lesion.  No new or progressive findings. Cycle 18 5-fluorouracil 07/06/2020 Cycle 19 5-fluorouracil 07/20/2020 Cycle 20 5-fluorouracil 08/03/2020 Cycle 21  5-fluorouracil 08/17/2020  Cycle 22 5-fluorouracil 08/31/2020 Cycle 23 5-fluorouracil 09/14/2020 CT chest 09/24/2020-increased number and size of pulmonary nodules bilaterally.  Findings in the right upper lobe are concerning for potential lymphangitic spread of tumor.  Right upper lobe findings could also reflect evolving postradiation change. Cycle 1 FOLFIRI 10/12/2020 Cycle 2 FOLFIRI 11/08/2020, irinotecan dose reduced due to neutropenia and thrombocytopenia following cycle 1 Cycle 3 FOLFIRI 12/01/2020 Cycle 4 FOLFIRI 12/22/2020 CT chest 01/11/2021-similar bilateral pulmonary nodules.  Left upper lobe nodule has mildly decreased in size.  No new nodules. Cycle 5 FOLFIRI 01/12/2021 Cycle 6 FOLFIRI 02/03/2021 Cycle 7 FOLFIRI 03/01/2021 Cycle 8 FOLFIRI 03/22/2021 CT chest 04/08/2021-multiple small bilateral lung nodules not significantly changed.  No new nodules.  Unchanged enlarged pretracheal nodes.  Increase in fibrotic consolidation of the perihilar and inferior right lung. Cycle 9 FOLFIRI 04/12/2021 05/04/2021 treatment held due to fatigue Cycle 10 FOLFIRI 05/18/2021 06/07/2021-treatment held secondary to neutropenia Cycle 11 FOLFIRI 06/14/2021 Cycle 12 FOLFIRI 07/05/2021 Cycle 13 FOLFIRI 07/27/2021, irinotecan held 14 FOLFIRI 08/16/2021, irinotecan held CT chest 09/02/2021-enlargement of several lung nodules, no new nodules, stable postradiation changes in the right lower lobe and right middle lobe, gastroesophageal varices identified Cycle one 5-FU/Panitumumab 11/01/2021 Cycle two 5-FU/panitumumab 11/29/2021-Solu-Medrol and Pepcid prophylaxis added after she developed a cough during the panitumumab with cycle 1 Cycle 3 5-FU/panitumumab 12/12/2021 Cycle four 5-FU/panitumumab 12/27/2021       2.   Hypothyroid   3.    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  Port-A-Cath placement 09/29/2019, interventional radiology   5.  Neutropenia and thrombocytopenia following  cycle 1 FOLFOX-plan chemotherapy dose reductions, she declined G-CSF   6.  Mucositis following cycle 5 FOLFOX, 5-fluorouracil dose reduced with cycle 6 Mucositis following cycle 22 5-fluorouracil-Magic mouthwash added   7.  Right lung  airspace disease/volume loss-likely toxicity from chest radiation, trial of prednisone 01/29/2020; dyspnea and cough improved 02/11/2020 Progressive cough 03/11/2020-prednisone resumed at a dose of 20 mg daily Cough and dyspnea improved 03/25/2020-prednisone taper to 15 mg daily Improved 04/06/2020-prednisone taper to 10 mg daily Stable 04/21/2020-prednisone taper to 5 mg daily   8.  Gastroesophageal varices    Disposition: Ms. Comp appears stable.  She tolerated the last cycle of 5-FU/panitumumab well.  She will complete another cycle today.  She will be scheduled for a restaging chest CT after cycle 5.  She will continue nystatin powder for the rash beneath the bilateral breast.  The rash may be related to a yeast rash or panitumumab toxicity.  She is using an antibiotic ointment for the paronychia.  Betsy Coder, MD  12/27/2021  9:47 AM

## 2021-12-28 ENCOUNTER — Other Ambulatory Visit: Payer: Self-pay

## 2021-12-29 ENCOUNTER — Inpatient Hospital Stay: Payer: Medicare Other

## 2021-12-29 VITALS — BP 128/66 | HR 82 | Temp 98.4°F | Resp 20

## 2021-12-29 DIAGNOSIS — Z5111 Encounter for antineoplastic chemotherapy: Secondary | ICD-10-CM | POA: Diagnosis not present

## 2021-12-29 DIAGNOSIS — C20 Malignant neoplasm of rectum: Secondary | ICD-10-CM

## 2021-12-29 MED ORDER — SODIUM CHLORIDE 0.9% FLUSH
10.0000 mL | INTRAVENOUS | Status: DC | PRN
  Administered 2021-12-29: 10 mL

## 2021-12-29 MED ORDER — HEPARIN SOD (PORK) LOCK FLUSH 100 UNIT/ML IV SOLN
500.0000 [IU] | Freq: Once | INTRAVENOUS | Status: AC | PRN
  Administered 2021-12-29: 500 [IU]

## 2021-12-29 NOTE — Patient Instructions (Signed)

## 2022-01-02 ENCOUNTER — Telehealth: Payer: Self-pay | Admitting: *Deleted

## 2022-01-02 NOTE — Telephone Encounter (Signed)
CT chest without contrast scheduled for [facility] on 12/24 at 2:15/2:30. Patient notified via VM and via [place].

## 2022-01-10 ENCOUNTER — Inpatient Hospital Stay: Payer: Medicare Other

## 2022-01-10 ENCOUNTER — Inpatient Hospital Stay: Payer: Medicare Other | Admitting: Oncology

## 2022-01-10 ENCOUNTER — Telehealth: Payer: Self-pay | Admitting: Oncology

## 2022-01-10 NOTE — Telephone Encounter (Signed)
Attempted to contact patient in regards to voicemail that patient left about cancelling appointments for today, no answer so voicemail was left for patient to call back to reschedule appointments.

## 2022-01-12 ENCOUNTER — Inpatient Hospital Stay: Payer: Medicare Other

## 2022-01-23 ENCOUNTER — Other Ambulatory Visit: Payer: Self-pay | Admitting: Oncology

## 2022-01-25 ENCOUNTER — Ambulatory Visit (HOSPITAL_BASED_OUTPATIENT_CLINIC_OR_DEPARTMENT_OTHER)
Admission: RE | Admit: 2022-01-25 | Discharge: 2022-01-25 | Disposition: A | Discharge status: Home / Self Care | Payer: Medicare Other | Source: Ambulatory Visit | Attending: Oncology | Admitting: Oncology

## 2022-01-25 DIAGNOSIS — C20 Malignant neoplasm of rectum: Secondary | ICD-10-CM | POA: Insufficient documentation

## 2022-01-26 ENCOUNTER — Inpatient Hospital Stay: Payer: Medicare Other

## 2022-01-26 ENCOUNTER — Inpatient Hospital Stay (HOSPITAL_BASED_OUTPATIENT_CLINIC_OR_DEPARTMENT_OTHER): Payer: Medicare Other | Admitting: Nurse Practitioner

## 2022-01-26 ENCOUNTER — Inpatient Hospital Stay: Payer: Medicare Other | Attending: Oncology

## 2022-01-26 ENCOUNTER — Telehealth: Payer: Self-pay

## 2022-01-26 VITALS — BP 122/72 | HR 80 | Resp 18

## 2022-01-26 VITALS — BP 146/84 | HR 96 | Temp 98.1°F | Resp 18 | Ht 63.0 in | Wt 205.8 lb

## 2022-01-26 DIAGNOSIS — I864 Gastric varices: Secondary | ICD-10-CM | POA: Insufficient documentation

## 2022-01-26 DIAGNOSIS — C20 Malignant neoplasm of rectum: Secondary | ICD-10-CM

## 2022-01-26 DIAGNOSIS — C7801 Secondary malignant neoplasm of right lung: Secondary | ICD-10-CM | POA: Insufficient documentation

## 2022-01-26 DIAGNOSIS — E039 Hypothyroidism, unspecified: Secondary | ICD-10-CM | POA: Insufficient documentation

## 2022-01-26 DIAGNOSIS — K1230 Oral mucositis (ulcerative), unspecified: Secondary | ICD-10-CM | POA: Insufficient documentation

## 2022-01-26 DIAGNOSIS — C7802 Secondary malignant neoplasm of left lung: Secondary | ICD-10-CM | POA: Insufficient documentation

## 2022-01-26 DIAGNOSIS — C787 Secondary malignant neoplasm of liver and intrahepatic bile duct: Secondary | ICD-10-CM | POA: Insufficient documentation

## 2022-01-26 DIAGNOSIS — Z95828 Presence of other vascular implants and grafts: Secondary | ICD-10-CM

## 2022-01-26 DIAGNOSIS — E876 Hypokalemia: Secondary | ICD-10-CM | POA: Insufficient documentation

## 2022-01-26 DIAGNOSIS — D696 Thrombocytopenia, unspecified: Secondary | ICD-10-CM | POA: Insufficient documentation

## 2022-01-26 LAB — CMP (CANCER CENTER ONLY)
ALT: 25 U/L (ref 0–44)
AST: 40 U/L (ref 15–41)
Albumin: 3.2 g/dL — ABNORMAL LOW (ref 3.5–5.0)
Alkaline Phosphatase: 93 U/L (ref 38–126)
Anion gap: 11 (ref 5–15)
BUN: 10 mg/dL (ref 8–23)
CO2: 23 mmol/L (ref 22–32)
Calcium: 7.2 mg/dL — ABNORMAL LOW (ref 8.9–10.3)
Chloride: 111 mmol/L (ref 98–111)
Creatinine: 0.67 mg/dL (ref 0.44–1.00)
GFR, Estimated: >60 mL/min (ref >60)
Glucose, Bld: 97 mg/dL (ref 70–99)
Potassium: 3.4 mmol/L — ABNORMAL LOW (ref 3.5–5.1)
Sodium: 145 mmol/L (ref 135–145)
Total Bilirubin: 0.9 mg/dL (ref 0.3–1.2)
Total Protein: 5.6 g/dL — ABNORMAL LOW (ref 6.5–8.1)

## 2022-01-26 LAB — CBC WITH DIFFERENTIAL (CANCER CENTER ONLY)
Abs Immature Granulocytes: 0.03 10*3/uL (ref 0.00–0.07)
Basophils Absolute: 0 10*3/uL (ref 0.0–0.1)
Basophils Relative: 1 %
Eosinophils Absolute: 0.3 10*3/uL (ref 0.0–0.5)
Eosinophils Relative: 5 %
HCT: 36 % (ref 36.0–46.0)
Hemoglobin: 12.1 g/dL (ref 12.0–15.0)
Immature Granulocytes: 1 %
Lymphocytes Relative: 15 %
Lymphs Abs: 0.8 10*3/uL (ref 0.7–4.0)
MCH: 33.1 pg (ref 26.0–34.0)
MCHC: 33.6 g/dL (ref 30.0–36.0)
MCV: 98.4 fL (ref 80.0–100.0)
Monocytes Absolute: 0.6 10*3/uL (ref 0.1–1.0)
Monocytes Relative: 11 %
Neutro Abs: 3.7 10*3/uL (ref 1.7–7.7)
Neutrophils Relative %: 67 %
Platelet Count: 100 10*3/uL — ABNORMAL LOW (ref 150–400)
RBC: 3.66 MIL/uL — ABNORMAL LOW (ref 3.87–5.11)
RDW: 15 % (ref 11.5–15.5)
WBC Count: 5.5 10*3/uL (ref 4.0–10.5)
nRBC: 0 % (ref 0.0–0.2)

## 2022-01-26 LAB — MAGNESIUM: Magnesium: 0.7 mg/dL — CL (ref 1.7–2.4)

## 2022-01-26 MED ORDER — POTASSIUM CHLORIDE 10 MEQ/100ML IV SOLN
10.0000 meq | INTRAVENOUS | Status: AC
  Administered 2022-01-26 (×2): 10 meq via INTRAVENOUS
  Filled 2022-01-26: qty 100

## 2022-01-26 MED ORDER — SODIUM CHLORIDE 0.9 % IV SOLN: INTRAVENOUS | Status: DC

## 2022-01-26 MED ORDER — MAGNESIUM SULFATE 4 GM/100ML IV SOLN
4.0000 g | Freq: Once | INTRAVENOUS | Status: AC
  Administered 2022-01-26: 4 g via INTRAVENOUS
  Filled 2022-01-26: qty 100

## 2022-01-26 MED ORDER — HEPARIN SOD (PORK) LOCK FLUSH 100 UNIT/ML IV SOLN
500.0000 [IU] | INTRAVENOUS | Status: AC | PRN
  Administered 2022-01-26: 500 [IU]

## 2022-01-26 MED ORDER — SODIUM CHLORIDE 0.9% FLUSH
10.0000 mL | INTRAVENOUS | Status: AC | PRN
  Administered 2022-01-26: 10 mL

## 2022-01-26 NOTE — Progress Notes (Signed)
Patient seen by Ned Card NP today  Vitals are within treatment parameters.  Labs reviewed by Ned Card NP and are within treatment parameters. Mg .7  K 3.4  Per physician team, patient will not be receiving treatment today. Potassium and magnesium ordered. 4 G mag and 20 K IV ordered today

## 2022-01-26 NOTE — Patient Instructions (Signed)
Magnesium Sulfate Injection What is this medication? MAGNESIUM SULFATE (mag NEE zee um SUL fate) prevents and treats low levels of magnesium in your body. It may also be used to prevent and treat seizures during pregnancy in people with high blood pressure disorders, such as preeclampsia or eclampsia. Magnesium plays an important role in maintaining the health of your muscles and nervous system. This medicine may be used for other purposes; ask your health care provider or pharmacist if you have questions. What should I tell my care team before I take this medication? They need to know if you have any of these conditions: Heart disease History of irregular heart beat Kidney disease An unusual or allergic reaction to magnesium sulfate, medications, foods, dyes, or preservatives Pregnant or trying to get pregnant Breast-feeding How should I use this medication? This medication is for infusion into a vein. It is given in a hospital or clinic setting. Talk to your care team about the use of this medication in children. While this medication may be prescribed for selected conditions, precautions do apply. Overdosage: If you think you have taken too much of this medicine contact a poison control center or emergency room at once. NOTE: This medicine is only for you. Do not share this medicine with others. What if I miss a dose? This does not apply. What may interact with this medication? Certain medications for anxiety or sleep Certain medications for seizures, such phenobarbital Digoxin Medications that relax muscles for surgery Narcotic medications for pain This list may not describe all possible interactions. Give your health care provider a list of all the medicines, herbs, non-prescription drugs, or dietary supplements you use. Also tell them if you smoke, drink alcohol, or use illegal drugs. Some items may interact with your medicine. What should I watch for while using this  medication? Your condition will be monitored carefully while you are receiving this medication. You may need blood work done while you are receiving this medication. What side effects may I notice from receiving this medication? Side effects that you should report to your care team as soon as possible: Allergic reactions--skin rash, itching, hives, swelling of the face, lips, tongue, or throat High magnesium level--confusion, drowsiness, facial flushing, redness, sweating, muscle weakness, fast or irregular heartbeat, trouble breathing Low blood pressure--dizziness, feeling faint or lightheaded, blurry vision Side effects that usually do not require medical attention (report to your care team if they continue or are bothersome): Headache Nausea This list may not describe all possible side effects. Call your doctor for medical advice about side effects. You may report side effects to FDA at 1-800-FDA-1088. Where should I keep my medication? This medication is given in a hospital or clinic and will not be stored at home. NOTE: This sheet is a summary. It may not cover all possible information. If you have questions about this medicine, talk to your doctor, pharmacist, or health care provider.  2023 Elsevier/Gold Standard (2012-05-24 00:00:00)  Potassium Chloride Injection What is this medication? POTASSIUM CHLORIDE (poe TASS i um KLOOR ide) prevents and treats low levels of potassium in your body. Potassium plays an important role in maintaining the health of your kidneys, heart, muscles, and nervous system. This medicine may be used for other purposes; ask your health care provider or pharmacist if you have questions. COMMON BRAND NAME(S): PROAMP What should I tell my care team before I take this medication? They need to know if you have any of these conditions: Addison disease Dehydration Diabetes (high  blood sugar) Heart disease High levels of potassium in the blood Irregular heartbeat  or rhythm Kidney disease Large areas of burned skin An unusual or allergic reaction to potassium, other medications, foods, dyes, or preservatives Pregnant or trying to get pregnant Breast-feeding How should I use this medication? This medication is injected into a vein. It is given in a hospital or clinic setting. Talk to your care team about the use of this medication in children. Special care may be needed. Overdosage: If you think you have taken too much of this medicine contact a poison control center or emergency room at once. NOTE: This medicine is only for you. Do not share this medicine with others. What if I miss a dose? This does not apply. This medication is not for regular use. What may interact with this medication? Do not take this medication with any of the following: Certain diuretics, such as spironolactone, triamterene Eplerenone Sodium polystyrene sulfonate This medication may also interact with the following: Certain medications for blood pressure or heart disease, such as lisinopril, losartan, quinapril, valsartan Medications that lower your chance of fighting infection, such as cyclosporine, tacrolimus NSAIDs, medications for pain and inflammation, such as ibuprofen or naproxen Other potassium supplements Salt substitutes This list may not describe all possible interactions. Give your health care provider a list of all the medicines, herbs, non-prescription drugs, or dietary supplements you use. Also tell them if you smoke, drink alcohol, or use illegal drugs. Some items may interact with your medicine. What should I watch for while using this medication? Visit your care team for regular checks on your progress. Tell your care team if your symptoms do not start to get better or if they get worse. You may need blood work while you are taking this medication. Avoid salt substitutes unless you are told otherwise by your care team. What side effects may I notice from  receiving this medication? Side effects that you should report to your care team as soon as possible: Allergic reactions--skin rash, itching, hives, swelling of the face, lips, tongue, or throat High potassium level--muscle weakness, fast or irregular heartbeat Side effects that usually do not require medical attention (report to your care team if they continue or are bothersome): Diarrhea Nausea Stomach pain Vomiting This list may not describe all possible side effects. Call your doctor for medical advice about side effects. You may report side effects to FDA at 1-800-FDA-1088. Where should I keep my medication? This medication is given in a hospital or clinic. It will not be stored at home. NOTE: This sheet is a summary. It may not cover all possible information. If you have questions about this medicine, talk to your doctor, pharmacist, or health care provider.  2023 Elsevier/Gold Standard (2020-04-29 00:00:00)

## 2022-01-26 NOTE — Telephone Encounter (Signed)
CRITICAL VALUE STICKER  CRITICAL VALUE: Mg 0.7  RECEIVER (on-site recipient of call): Gerald Stabs  DATE & TIME NOTIFIED: 01/26/22 0948  MESSENGER (representative from lab): Jordan Hawks  MD NOTIFIED: Ned Card, NP  TIME OF NOTIFICATION:0948  RESPONSE:  patient at visit now

## 2022-01-26 NOTE — Progress Notes (Signed)
[place] OFFICE PROGRESS NOTE   Diagnosis: Rectal cancer  INTERVAL HISTORY:   Ms. Bohlen returns as scheduled.  She completed cycle four 5-FU/Panitumumab 12/27/2021.  She canceled appointments on 01/10/2022.  She reports poor energy and poor appetite.  Continued dyspnea on exertion.  No nausea or vomiting.  No mouth sores.  No diarrhea.  Skin is dry.  Rash over chest is better.  Objective:  Vital signs in last 24 hours:  Blood pressure (!) 146/84, pulse 96, temperature 98.1 F (36.7 C), temperature source Oral, resp. rate 18, height _0  (1.6 m), weight 205 lb 12.8 oz (93.4 kg), SpO2 100 %.    HEENT: No thrush or ulcers. Resp: Diminished breath sounds right lower lung field.  No respiratory distress. Cardio: Regular rate and rhythm. GI: Abdomen soft and nontender.  No hepatosplenomegaly. Vascular: No leg edema. Skin: Skin in general has a dry appearance.  Resolving acne type rash upper chest. Port-A-Cath without erythema.   Lab Results:  Lab Results  Component Value Date   WBC 5.5 01/26/2022   HGB 12.1 01/26/2022   HCT 36.0 01/26/2022   MCV 98.4 01/26/2022   PLT 100 (L) 01/26/2022   NEUTROABS 3.7 01/26/2022    Imaging:  CT CHEST WO CONTRAST  Result Date: 01/26/2022 CLINICAL DATA:  71 year old female with history of rectal cancer. Restaging examination. * Tracking Code: BO * EXAM: CT CHEST WITHOUT CONTRAST TECHNIQUE: Multidetector CT imaging of the chest was performed following the standard protocol without IV contrast. RADIATION DOSE REDUCTION: This exam was performed according to the departmental dose-optimization program which includes automated exposure control, adjustment of the mA and/or kV according to patient size and/or use of iterative reconstruction technique. COMPARISON:  Chest CT 09/02/2021. FINDINGS: Cardiovascular: Heart size is normal. There is no significant pericardial fluid, thickening or pericardial calcification. There is aortic  atherosclerosis, as well as atherosclerosis of the great vessels of the mediastinum and the coronary arteries, including calcified atherosclerotic plaque in the left main and left anterior descending coronary arteries. Right internal jugular single-lumen porta cath with tip terminating in the right atrium. Mediastinum/Nodes: Multiple prominent but nonenlarged mediastinal lymph nodes are again noted, nonspecific. No definite pathologically enlarged mediastinal or hilar lymph nodes are noted on today's noncontrast CT examination. Small hiatal hernia. Serpiginous densities adjacent to the distal esophagus, most compatible with paraesophageal varices. No axillary lymphadenopathy. Lungs/Pleura: Numerous pulmonary nodules appear similar in number, but increased in size compared to the prior study, indicating progression of metastatic disease to the lungs. The largest lesion is in the right lower lobe (axial image 73 of series 4) currently measuring 2.9 x 2.1 cm (previously 2.0 x 2.0 cm when remeasured on axial image 69 of series 3 of prior study 09/02/2021. The largest lesion in the left lung is in the medial aspect of the left upper lobe (axial image 68 of series 4) currently measuring 1.6 x 1.0 cm (previously 1.4 x 1.1 cm). 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 No acute consolidative airspace disease. Trace amount of right pleural fluid (unchanged). No left pleural effusion. Upper Abdomen: Numerous calcified gallstones lying dependently within the gallbladder. Atrophy in the left lobe of the liver again noted. Probable intrahepatic biliary ductal dilatation noted in the left lobe of the liver, poorly evaluated on today's noncontrast CT examination, but similar to the  prior examinations. Musculoskeletal: There are no aggressive  appearing lytic or blastic lesions noted in the visualized portions of the skeleton. IMPRESSION: 1. Mild progression of metastatic disease to the lungs as evidenced by increased size but stable number of metastatic lesions in the lungs, as detailed above. 2. Aortic atherosclerosis, in addition to left main and left anterior descending coronary artery disease. Assessment for potential risk factor modification, dietary therapy or pharmacologic therapy may be warranted, if clinically indicated. 3. Cholelithiasis without evidence of acute cholecystitis. 4. Additional incidental findings, similar to prior studies, as above. Aortic Atherosclerosis (ICD10-I70.0). Electronically Signed   By: Vinnie Langton M.D.   On: 01/26/2022 08:49    Medications: I have reviewed the patient's current medications.  Assessment/Plan: Rectal cancer Mass at 7 cm from the anal verge on colonoscopy 08/09/2017, biopsy revealed invasive adenocarcinoma Staging CTs 08/17/2017-no evidence of metastatic disease, asymmetric thickening in the mid rectum MR pelvis 09/01/2017, T3N0 lesion beginning at 6.3 cm from the anal sphincter Radiation/Xeloda initiated 09/17/2017, completed 10/25/2017 Low anterior resection/diverting ileostomy 12/21/2017,ypT3,ypN1a tumor.  Lymphovascular invasion present, intact mismatch repair protein expression, treatment effect present (TRS 1).  Mismatch repair protein IHC normal; Foundation 1-KRAS/NRAS wild-type, microsatellite status and tumor mutational burden could not be determined. Cycle 1 adjuvant Xeloda beginning 01/21/2018 Cycle 2 adjuvant Xeloda beginning 02/11/2018 Xeloda discontinued after cycle 2 secondary to patient preference Ileostomy takedown 07/10/2018 CTs 09/07/2018- multiple live small pulmonary nodules concerning for metastatic disease CT chest 12/10/2018-multiple bilateral lung nodules, some have increased in size CT chest 04/08/2019-mild enlargement of bilateral lung nodules Status post SBRT  multiple lung nodules 05/06/2019, 05/08/2019, 05/13/2019 CT chest 08/28/2019-improvement and resolution in majority of right-sided pulmonary nodules, a superior segment right lower lobe nodule has increased, no new right-sided nodules.  Progressive enlargement of left-sided pulmonary nodules PET scan 09/09/2019-hypermetabolic right lower lobe nodule, 2 hypermetabolic left upper lobe nodules with an additional 0.7 cm left upper lobe nodule below PET resolution, groundglass opacity in the mid to right lower lobe with associated hypermetabolism consistent with postradiation change, hypermetabolic central segment 4A liver lesion Cycle 1 FOLFOX 10/14/2019 Cycle  2FOLFOX 11/11/2019, 5-FU and oxaliplatin dose reduced secondary to neutropenia and thrombocytopenia following cycle one, G-CSF declined Cycle 3 FOLFOX 11/26/2019 Cycle 4 FOLFOX 12/11/2019, oxaliplatin held due to neutropenia and thrombocytopenia Cycle 5 FOLFOX 12/31/2019 Cycle 6 FOLFOX 01/14/2020 (oxaliplatin held, 5-fluorouracil dose reduced due to mucositis) CT chest 01/27/2020-improvement in left lung nodules, progressive airspace disease with traction bronchiectasis throughout the right lung, previously noted right lung nodules are obscured, mildly enlarged right paratracheal lymph nodes-not hypermetabolic on prior PET, likely reactive Cycle 7 FOLFOX 01/28/2020 Cycle 8 FOLFOX 02/11/2020 (oxaliplatin held due to neutropenia and thrombocytopenia) Cycle 9 FOLFOX 02/25/2020 (oxaliplatin held due to thrombocytopenia) Cycle 10 FOLFOX 03/11/2020 (oxaliplatin held secondary to neuropathy) Cycle 11 FOLFOX 03/25/2020 (oxaliplatin held due to neuropathy) CT chest 04/05/2020-no new or progressive findings.  Interval evolution of presumed postradiation scarring in the right perihilar lung with decrease in the more diffuse groundglass opacity seen previously.  No substantial change in left lung nodules. 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  No definite CT findings to suggest recurrent tumor.  Stable small left upper lobe pulmonary nodules.  No new or progressive findings.  Stable small right paratracheal lymph nodes.  Stable area of irregular low-attenuation in hepatic segment 4A, site of known prior hepatic metastatic lesion.  No new or progressive findings. Cycle 18 5-fluorouracil 07/06/2020 Cycle 19 5-fluorouracil 07/20/2020 Cycle 20 5-fluorouracil 08/03/2020 Cycle 21 5-fluorouracil 08/17/2020  Cycle 22 5-fluorouracil 08/31/2020 Cycle 23 5-fluorouracil 09/14/2020 CT chest 09/24/2020-increased number and size of pulmonary nodules bilaterally.  Findings in the right upper lobe are concerning for potential lymphangitic spread of tumor.  Right upper lobe findings could also reflect evolving postradiation change. Cycle 1 FOLFIRI 10/12/2020 Cycle 2 FOLFIRI 11/08/2020, irinotecan dose reduced due to neutropenia and thrombocytopenia following cycle 1 Cycle 3 FOLFIRI 12/01/2020 Cycle 4 FOLFIRI 12/22/2020 CT chest 01/11/2021-similar bilateral pulmonary nodules.  Left upper lobe nodule has mildly decreased in size.  No new nodules. Cycle 5 FOLFIRI 01/12/2021 Cycle 6 FOLFIRI 02/03/2021 Cycle 7 FOLFIRI 03/01/2021 Cycle 8 FOLFIRI 03/22/2021 CT chest 04/08/2021-multiple small bilateral lung nodules not significantly changed.  No new nodules.  Unchanged enlarged pretracheal nodes.  Increase in fibrotic consolidation of the perihilar and inferior right lung. Cycle 9 FOLFIRI 04/12/2021 05/04/2021 treatment held due to fatigue Cycle 10 FOLFIRI 05/18/2021 06/07/2021-treatment held secondary to neutropenia Cycle 11 FOLFIRI 06/14/2021 Cycle 12 FOLFIRI 07/05/2021 Cycle 13 FOLFIRI 07/27/2021,  irinotecan held 14 FOLFIRI 08/16/2021, irinotecan held CT chest 09/02/2021-enlargement of several lung nodules, no new nodules, stable postradiation changes in the right lower lobe and right middle lobe, gastroesophageal varices identified Cycle one 5-FU/Panitumumab 11/01/2021 Cycle two 5-FU/panitumumab 11/29/2021-Solu-Medrol and Pepcid prophylaxis added after she developed a cough during the panitumumab with cycle 1 Cycle 3 5-FU/panitumumab 12/12/2021 Cycle four 5-FU/panitumumab 12/27/2021 CT chest 01/25/2022-mild progression of metastatic disease to the lungs with increased size but stable number of metastatic lesions.       2.   Hypothyroid   3.    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  Port-A-Cath placement 09/29/2019, interventional radiology   5.  Neutropenia and thrombocytopenia following cycle 1 FOLFOX-plan chemotherapy dose reductions, she declined G-CSF   6.  Mucositis following cycle 5 FOLFOX, 5-fluorouracil dose reduced with cycle 6 Mucositis following cycle 22 5-fluorouracil-Magic mouthwash added   7.  Right lung airspace disease/volume loss-likely toxicity from chest radiation, trial of prednisone 01/29/2020; dyspnea and cough improved 02/11/2020 Progressive cough 03/11/2020-prednisone resumed at a dose of 20 mg daily Cough and dyspnea improved 03/25/2020-prednisone taper to 15 mg daily Improved 04/06/2020-prednisone taper to 10 mg daily Stable 04/21/2020-prednisone taper to 5 mg daily   8.  Gastroesophageal varices   Disposition: Ms. Atayde appears stable.  She has completed 4 cycles of 5-FU/Panitumumab.  Restaging chest CT with stable to mild increase in several lung nodules, no new nodules.  There was an approximate 61-monthgap between the comparison CT and first cycle of 5-FU/Panitumumab.  She understands the recommendation is to continue the current treatment.  She declines further 5-FU/Panitumumab.  Dr. SBenay Spicerecommends  Lonsurf/Avastin.  We reviewed potential side effects.  She is undecided.  We provided her with printed information on both drugs.  She will review the information over the weekend and let [place] decision early next week.  We reviewed the labs from today.  She has significant hypomagnesemia and mild hypokalemia.  She will receive IV replacement for both today.  She will return for a follow-up basic metabolic panel and magnesium level on 01/31/2021.  She decided to hold off on scheduling a follow-up appointment until she has had a chance to review provided information on Lonsurf/Avastin.  Patient seen with  Dr. Benay Spice.  CT report/images reviewed on the computer with Ms. Nieland.    Ned Card ANP/GNP-BC   01/26/2022  11:01 AM This was a shared visit with Ned Card.  We reviewed the CT findings and images with Ms. Wadas.  There is a mild increase in the size of lung metastases, but there was a 2-week interval between the baseline CT and initiation of the 5-FU/panitumumab.  I recommend continuing 5-FU/panitumumab.  She declines.  I recommend Lonsurf/Avastin as a next salvage regimen.  We reviewed potential toxicities associated with Lonsurf and Avastin.  She would like to review reading materials on these agents prior to making a decision on further systemic therapy.  She will receive potassium magnesium supplements today.  Ms. Mainor will contact us next week with her decision on proceeding with Lonsurf/Avastin.  I was present for greater than 50% of today's visit.  I performed medical decision making.  Julieanne Manson, MD

## 2022-01-27 ENCOUNTER — Other Ambulatory Visit: Payer: Self-pay

## 2022-01-31 ENCOUNTER — Inpatient Hospital Stay: Payer: Medicare Other | Attending: Oncology

## 2022-01-31 ENCOUNTER — Inpatient Hospital Stay: Payer: Medicare Other

## 2022-01-31 ENCOUNTER — Telehealth: Payer: Self-pay | Admitting: *Deleted

## 2022-01-31 ENCOUNTER — Telehealth: Payer: Self-pay

## 2022-01-31 DIAGNOSIS — E039 Hypothyroidism, unspecified: Secondary | ICD-10-CM | POA: Insufficient documentation

## 2022-01-31 DIAGNOSIS — K1230 Oral mucositis (ulcerative), unspecified: Secondary | ICD-10-CM | POA: Insufficient documentation

## 2022-01-31 DIAGNOSIS — C20 Malignant neoplasm of rectum: Secondary | ICD-10-CM | POA: Diagnosis not present

## 2022-01-31 DIAGNOSIS — Z923 Personal history of irradiation: Secondary | ICD-10-CM | POA: Insufficient documentation

## 2022-01-31 DIAGNOSIS — C787 Secondary malignant neoplasm of liver and intrahepatic bile duct: Secondary | ICD-10-CM | POA: Insufficient documentation

## 2022-01-31 DIAGNOSIS — I864 Gastric varices: Secondary | ICD-10-CM | POA: Diagnosis not present

## 2022-01-31 DIAGNOSIS — C7802 Secondary malignant neoplasm of left lung: Secondary | ICD-10-CM | POA: Diagnosis not present

## 2022-01-31 DIAGNOSIS — R0609 Other forms of dyspnea: Secondary | ICD-10-CM | POA: Insufficient documentation

## 2022-01-31 DIAGNOSIS — D696 Thrombocytopenia, unspecified: Secondary | ICD-10-CM | POA: Insufficient documentation

## 2022-01-31 DIAGNOSIS — C7801 Secondary malignant neoplasm of right lung: Secondary | ICD-10-CM | POA: Diagnosis not present

## 2022-01-31 LAB — BASIC METABOLIC PANEL - CANCER CENTER ONLY
Anion gap: 11 (ref 5–15)
BUN: 7 mg/dL — ABNORMAL LOW (ref 8–23)
CO2: 23 mmol/L (ref 22–32)
Calcium: 7 mg/dL — ABNORMAL LOW (ref 8.9–10.3)
Chloride: 110 mmol/L (ref 98–111)
Creatinine: 0.57 mg/dL (ref 0.44–1.00)
GFR, Estimated: >60 mL/min (ref >60)
Glucose, Bld: 98 mg/dL (ref 70–99)
Potassium: 3.3 mmol/L — ABNORMAL LOW (ref 3.5–5.1)
Sodium: 144 mmol/L (ref 135–145)

## 2022-01-31 LAB — MAGNESIUM: Magnesium: 0.6 mg/dL — CL (ref 1.7–2.4)

## 2022-01-31 NOTE — Telephone Encounter (Signed)
CRITICAL VALUE STICKER  CRITICAL VALUE: Mg+ 0.6  RECEIVER (on-site recipient of call):Sadey Yandell,RN  DATE & TIME NOTIFIED: 01/31/22 @ 1048  MESSENGER (representative from lab):[place]  MD NOTIFIED: Dr. Benay Spice  TIME OF NOTIFICATION:10:57  RESPONSE: Needs 4 grams IV Mg+ today   Left VM for patient to call back to confirm she can return today at 1 pm for 4 grams Mg+.

## 2022-01-31 NOTE — Telephone Encounter (Signed)
Left 3rd voice mail regarding IV Mg+ on 1/3 at 1 pm. Requested return call to confirm she is coming.

## 2022-02-01 ENCOUNTER — Inpatient Hospital Stay: Payer: Medicare Other

## 2022-02-01 ENCOUNTER — Other Ambulatory Visit: Payer: Self-pay

## 2022-02-01 ENCOUNTER — Telehealth: Payer: Self-pay | Admitting: *Deleted

## 2022-02-01 NOTE — Telephone Encounter (Signed)
Called patient and discussed low Mg+ and need for infusion today at 1 pm. She reports she is not able to come in today or tomorrow--has some other appointments that she needs to take care of. Agrees to Friday after lunch. Notified charge nurse and scheduler.

## 2022-02-02 ENCOUNTER — Other Ambulatory Visit: Payer: Self-pay

## 2022-02-03 ENCOUNTER — Other Ambulatory Visit: Payer: Self-pay | Admitting: *Deleted

## 2022-02-03 ENCOUNTER — Inpatient Hospital Stay: Payer: Medicare Other

## 2022-02-03 ENCOUNTER — Telehealth: Payer: Self-pay

## 2022-02-03 MED ORDER — SODIUM CHLORIDE 0.9 % IV SOLN: Freq: Once | INTRAVENOUS | Status: AC

## 2022-02-03 MED ORDER — SODIUM CHLORIDE 0.9% FLUSH
10.0000 mL | Freq: Once | INTRAVENOUS | Status: AC
  Administered 2022-02-03: 10 mL via INTRAVENOUS

## 2022-02-03 MED ORDER — MAGNESIUM SULFATE 4 GM/100ML IV SOLN
4.0000 g | Freq: Once | INTRAVENOUS | Status: AC
  Administered 2022-02-03: 4 g via INTRAVENOUS
  Filled 2022-02-03: qty 100

## 2022-02-03 MED ORDER — HEPARIN SOD (PORK) LOCK FLUSH 100 UNIT/ML IV SOLN
500.0000 [IU] | Freq: Once | INTRAVENOUS | Status: AC
  Administered 2022-02-03: 500 [IU] via INTRAVENOUS

## 2022-02-03 NOTE — Telephone Encounter (Signed)
Samantha Lutz chosen to take only the Lonsurf, no Avastin. She has information on both medication. 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 Patient is schedule for her next visit with the provider and I will make the provider aware of her choice.

## 2022-02-03 NOTE — Patient Instructions (Signed)
Hypomagnesemia Hypomagnesemia is a condition in which the level of magnesium in the blood is too low. Magnesium is a mineral that is found in many foods. It is used in many different processes in the body. Hypomagnesemia can affect every organ in the body. In severe cases, it can cause life-threatening problems. What are the causes? This condition may be caused by: Not getting enough magnesium in your diet or not having enough healthy foods to eat (malnutrition). Problems with magnesium absorption in the intestines. Dehydration. Excessive use of alcohol. Vomiting. Severe or long-term (chronic) diarrhea. Some medicines, including medicines that make you urinate more often (diuretics). Certain diseases, such as kidney disease, diabetes, celiac disease, and overactive thyroid. What are the signs or symptoms? Symptoms of this condition include: Loss of appetite, nausea, and vomiting. Involuntary shaking or trembling of a body part (tremor). Muscle weakness or tingling in the arms and legs. Sudden tightening of muscles (muscle spasms). Confusion. Psychiatric issues, such as: Depression and irritability. Psychosis. A feeling of fluttering of the heart (palpitations). Seizures. These symptoms are more severe if magnesium levels drop suddenly. How is this diagnosed? This condition may be diagnosed based on: Your symptoms and medical history. A physical exam. Blood and urine tests. How is this treated? Treatment depends on the cause and the severity of the condition. It may be treated by: Taking a magnesium supplement. This can be taken in pill form. If the condition is severe, magnesium is usually given through an IV. Making changes to your diet. You may be directed to eat foods that have a lot of magnesium, such as green leafy vegetables, peas, beans, and nuts. Not drinking alcohol. If you are struggling not to drink, ask your health care provider for help. Follow these instructions at  home: Eating and drinking     Make sure that your diet includes foods with magnesium. Foods that have a lot of magnesium in them include: Green leafy vegetables, such as spinach and broccoli. Beans and peas. Nuts and seeds, such as almonds and sunflower seeds. Whole grains, such as whole grain bread and fortified cereals. Drink fluids that contain salts and minerals (electrolytes), such as sports drinks, when you are active. Do not drink alcohol. General instructions Take over-the-counter and prescription medicines only as told by your health care provider. Take magnesium supplements as directed if your health care provider tells you to take them. Have your magnesium levels monitored as told by your health care provider. Keep all follow-up visits. This is important. Contact a health care provider if: You get worse instead of better. Your symptoms return. Get help right away if: You develop severe muscle weakness. You have trouble breathing. You feel that your heart is racing. These symptoms may represent a serious problem that is an emergency. Do not wait to see if the symptoms will go away. Get medical help right away. Call your local emergency services (911 in the [place] Do not drive yourself to the hospital. Summary Hypomagnesemia is a condition in which the level of magnesium in the blood is too low. Hypomagnesemia can affect every organ in the body. Treatment may include eating more foods that contain magnesium, taking magnesium supplements, and not drinking alcohol. Have your magnesium levels monitored as told by your health care provider. This information is not intended to replace advice given to you by your health care provider. Make sure you discuss any questions you have with your health care provider. Document Revised: 06/15/2020 Document Reviewed: 06/15/2020 Elsevier Patient Education    2023 [employer]  

## 2022-02-05 ENCOUNTER — Other Ambulatory Visit: Payer: Self-pay

## 2022-02-05 ENCOUNTER — Other Ambulatory Visit: Payer: Self-pay | Admitting: Oncology

## 2022-02-05 DIAGNOSIS — E039 Hypothyroidism, unspecified: Secondary | ICD-10-CM

## 2022-02-07 ENCOUNTER — Encounter: Payer: Self-pay | Admitting: Oncology

## 2022-02-07 NOTE — Telephone Encounter (Signed)
Chart reiew

## 2022-02-22 ENCOUNTER — Other Ambulatory Visit (HOSPITAL_COMMUNITY): Payer: Self-pay

## 2022-02-22 ENCOUNTER — Inpatient Hospital Stay: Payer: Medicare Other

## 2022-02-22 ENCOUNTER — Telehealth: Payer: Self-pay

## 2022-02-22 ENCOUNTER — Inpatient Hospital Stay: Payer: Medicare Other | Admitting: Oncology

## 2022-02-22 ENCOUNTER — Encounter: Payer: Self-pay | Admitting: Oncology

## 2022-02-22 VITALS — BP 132/80 | HR 100 | Temp 98.2°F | Resp 18 | Ht 63.0 in | Wt 202.6 lb

## 2022-02-22 DIAGNOSIS — C20 Malignant neoplasm of rectum: Secondary | ICD-10-CM

## 2022-02-22 LAB — CBC WITH DIFFERENTIAL (CANCER CENTER ONLY)
Abs Immature Granulocytes: 0.02 10*3/uL (ref 0.00–0.07)
Basophils Absolute: 0 10*3/uL (ref 0.0–0.1)
Basophils Relative: 0 %
Eosinophils Absolute: 0.3 10*3/uL (ref 0.0–0.5)
Eosinophils Relative: 4 %
HCT: 38.1 % (ref 36.0–46.0)
Hemoglobin: 12.7 g/dL (ref 12.0–15.0)
Immature Granulocytes: 0 %
Lymphocytes Relative: 15 %
Lymphs Abs: 1.1 10*3/uL (ref 0.7–4.0)
MCH: 32.4 pg (ref 26.0–34.0)
MCHC: 33.3 g/dL (ref 30.0–36.0)
MCV: 97.2 fL (ref 80.0–100.0)
Monocytes Absolute: 0.8 10*3/uL (ref 0.1–1.0)
Monocytes Relative: 12 %
Neutro Abs: 4.7 10*3/uL (ref 1.7–7.7)
Neutrophils Relative %: 69 %
Platelet Count: 129 10*3/uL — ABNORMAL LOW (ref 150–400)
RBC: 3.92 MIL/uL (ref 3.87–5.11)
RDW: 13.3 % (ref 11.5–15.5)
WBC Count: 6.9 10*3/uL (ref 4.0–10.5)
nRBC: 0 % (ref 0.0–0.2)

## 2022-02-22 LAB — CMP (CANCER CENTER ONLY)
ALT: 69 U/L — ABNORMAL HIGH (ref 0–44)
AST: 73 U/L — ABNORMAL HIGH (ref 15–41)
Albumin: 3.4 g/dL — ABNORMAL LOW (ref 3.5–5.0)
Alkaline Phosphatase: 108 U/L (ref 38–126)
Anion gap: 9 (ref 5–15)
BUN: 12 mg/dL (ref 8–23)
CO2: 23 mmol/L (ref 22–32)
Calcium: 9 mg/dL (ref 8.9–10.3)
Chloride: 108 mmol/L (ref 98–111)
Creatinine: 0.7 mg/dL (ref 0.44–1.00)
GFR, Estimated: >60 mL/min (ref >60)
Glucose, Bld: 95 mg/dL (ref 70–99)
Potassium: 4.1 mmol/L (ref 3.5–5.1)
Sodium: 140 mmol/L (ref 135–145)
Total Bilirubin: 1.9 mg/dL — ABNORMAL HIGH (ref 0.3–1.2)
Total Protein: 6.3 g/dL — ABNORMAL LOW (ref 6.5–8.1)

## 2022-02-22 LAB — MAGNESIUM: Magnesium: 1.5 mg/dL — ABNORMAL LOW (ref 1.7–2.4)

## 2022-02-22 MED ORDER — LONSURF 20-8.19 MG PO TABS
ORAL_TABLET | ORAL | 0 refills | Status: DC
  Filled 2022-02-22: qty 60, fill #0
  Filled 2022-02-24: qty 60, 28d supply, fill #0

## 2022-02-22 MED ORDER — MAGNESIUM OXIDE -MG SUPPLEMENT 400 (240 MG) MG PO TABS: 400.0000 mg | ORAL_TABLET | Freq: Two times a day (BID) | ORAL | 2 refills | Status: DC

## 2022-02-22 NOTE — Progress Notes (Signed)
[place] OFFICE PROGRESS NOTE   Diagnosis: Rectal cancer  INTERVAL HISTORY:   Samantha Lutz returns as scheduled.  She has stable exertional dyspnea.  Poor appetite.  She is here with her daughter.  She has decided against bevacizumab.  Objective:  Vital signs in last 24 hours:  Blood pressure 132/80, pulse 100, temperature 98.2 F (36.8 C), temperature source Oral, resp. rate 18, height '5\' 3"'$  (1.6 m), weight 202 lb 9.6 oz (91.9 kg), SpO2 96 %.    Resp: Decreased breath sounds at the right compared to the left posterior chest, no respiratory distress Cardio: Regular rate and rhythm GI: No hepatosplenomegaly Vascular: No leg edema  Skin: Resolving acne type rash over the upper anterior chest, diffuse dryness and flaking  Portacath/PICC-without erythema  Lab Results:  Lab Results  Component Value Date   WBC 6.9 02/22/2022   HGB 12.7 02/22/2022   HCT 38.1 02/22/2022   MCV 97.2 02/22/2022   PLT 129 (L) 02/22/2022   NEUTROABS 4.7 02/22/2022    CMP  Lab Results  Component Value Date   NA 140 02/22/2022   K 4.1 02/22/2022   CL 108 02/22/2022   CO2 23 02/22/2022   GLUCOSE 95 02/22/2022   BUN 12 02/22/2022   CREATININE 0.70 02/22/2022   CALCIUM 9.0 02/22/2022   PROT 6.3 (L) 02/22/2022   ALBUMIN 3.4 (L) 02/22/2022   AST 73 (H) 02/22/2022   ALT 69 (H) 02/22/2022   ALKPHOS 108 02/22/2022   BILITOT 1.9 (H) 02/22/2022   GFRNONAA >60 02/22/2022   GFRAA >60 10/28/2019    Lab Results  Component Value Date   CEA1 <1.00 06/01/2020   CEA <1.00 06/01/2020    Medications: I have reviewed the patient's current medications.   Assessment/Plan: Rectal cancer Mass at 7 cm from the anal verge on colonoscopy 08/09/2017, biopsy revealed invasive adenocarcinoma Staging CTs 08/17/2017-no evidence of metastatic disease, asymmetric thickening in the mid rectum MR pelvis 09/01/2017, T3N0 lesion beginning at 6.3 cm from the anal sphincter Radiation/Xeloda initiated  09/17/2017, completed 10/25/2017 Low anterior resection/diverting ileostomy 12/21/2017,ypT3,ypN1a tumor.  Lymphovascular invasion present, intact mismatch repair protein expression, treatment effect present (TRS 1).  Mismatch repair protein IHC normal; Foundation 1-KRAS/NRAS wild-type, microsatellite status and tumor mutational burden could not be determined. Cycle 1 adjuvant Xeloda beginning 01/21/2018 Cycle 2 adjuvant Xeloda beginning 02/11/2018 Xeloda discontinued after cycle 2 secondary to patient preference Ileostomy takedown 07/10/2018 CTs 09/07/2018- multiple live small pulmonary nodules concerning for metastatic disease CT chest 12/10/2018-multiple bilateral lung nodules, some have increased in size CT chest 04/08/2019-mild enlargement of bilateral lung nodules Status post SBRT multiple lung nodules 05/06/2019, 05/08/2019, 05/13/2019 CT chest 08/28/2019-improvement and resolution in majority of right-sided pulmonary nodules, a superior segment right lower lobe nodule has increased, no new right-sided nodules.  Progressive enlargement of left-sided pulmonary nodules PET scan 09/09/2019-hypermetabolic right lower lobe nodule, 2 hypermetabolic left upper lobe nodules with an additional 0.7 cm left upper lobe nodule below PET resolution, groundglass opacity in the mid to right lower lobe with associated hypermetabolism consistent with postradiation change, hypermetabolic central segment 4A liver lesion Cycle 1 FOLFOX 10/14/2019 Cycle  2FOLFOX 11/11/2019, 5-FU and oxaliplatin dose reduced secondary to neutropenia and thrombocytopenia following cycle one, G-CSF declined Cycle 3 FOLFOX 11/26/2019 Cycle 4 FOLFOX 12/11/2019, oxaliplatin held due to neutropenia and thrombocytopenia Cycle 5 FOLFOX 12/31/2019 Cycle 6 FOLFOX 01/14/2020 (oxaliplatin held, 5-fluorouracil dose reduced due to mucositis) CT chest 01/27/2020-improvement in left lung nodules, progressive airspace disease with traction bronchiectasis  throughout  the right lung, previously noted right lung nodules are obscured, mildly enlarged right paratracheal lymph nodes-not hypermetabolic on prior PET, likely reactive Cycle 7 FOLFOX 01/28/2020 Cycle 8 FOLFOX 02/11/2020 (oxaliplatin held due to neutropenia and thrombocytopenia) Cycle 9 FOLFOX 02/25/2020 (oxaliplatin held due to thrombocytopenia) Cycle 10 FOLFOX 03/11/2020 (oxaliplatin held secondary to neuropathy) Cycle 11 FOLFOX 03/25/2020 (oxaliplatin held due to neuropathy) CT chest 04/05/2020-no new or progressive findings.  Interval evolution of presumed postradiation scarring in the right perihilar lung with decrease in the more diffuse groundglass opacity seen previously.  No substantial change in left lung nodules. 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  No definite CT findings to suggest recurrent tumor.  Stable small left upper lobe pulmonary nodules.  No new or progressive findings.  Stable small right paratracheal lymph nodes.  Stable area of irregular low-attenuation in hepatic segment 4A, site of known prior hepatic metastatic lesion.  No new or progressive findings. Cycle 18 5-fluorouracil 07/06/2020 Cycle 19 5-fluorouracil 07/20/2020 Cycle 20 5-fluorouracil 08/03/2020 Cycle 21 5-fluorouracil 08/17/2020  Cycle 22 5-fluorouracil 08/31/2020 Cycle 23 5-fluorouracil 09/14/2020 CT chest 09/24/2020-increased number and size of pulmonary nodules bilaterally.  Findings in the right upper lobe are concerning for potential lymphangitic spread of tumor.  Right upper lobe findings could also reflect evolving postradiation change. Cycle 1 FOLFIRI 10/12/2020 Cycle 2 FOLFIRI 11/08/2020, irinotecan dose reduced due to  neutropenia and thrombocytopenia following cycle 1 Cycle 3 FOLFIRI 12/01/2020 Cycle 4 FOLFIRI 12/22/2020 CT chest 01/11/2021-similar bilateral pulmonary nodules.  Left upper lobe nodule has mildly decreased in size.  No new nodules. Cycle 5 FOLFIRI 01/12/2021 Cycle 6 FOLFIRI 02/03/2021 Cycle 7 FOLFIRI 03/01/2021 Cycle 8 FOLFIRI 03/22/2021 CT chest 04/08/2021-multiple small bilateral lung nodules not significantly changed.  No new nodules.  Unchanged enlarged pretracheal nodes.  Increase in fibrotic consolidation of the perihilar and inferior right lung. Cycle 9 FOLFIRI 04/12/2021 05/04/2021 treatment held due to fatigue Cycle 10 FOLFIRI 05/18/2021 06/07/2021-treatment held secondary to neutropenia Cycle 11 FOLFIRI 06/14/2021 Cycle 12 FOLFIRI 07/05/2021 Cycle 13 FOLFIRI 07/27/2021, irinotecan held 14 FOLFIRI 08/16/2021, irinotecan held CT chest 09/02/2021-enlargement of several lung nodules, no new nodules, stable postradiation changes in the right lower lobe and right middle lobe, gastroesophageal varices identified Cycle one 5-FU/Panitumumab 11/01/2021 Cycle two 5-FU/panitumumab 11/29/2021-Solu-Medrol and Pepcid prophylaxis added after she developed a cough during the panitumumab with cycle 1 Cycle 3 5-FU/panitumumab 12/12/2021 Cycle four 5-FU/panitumumab 12/27/2021 CT chest 01/25/2022-mild progression of metastatic disease to the lungs with increased size but stable number of metastatic lesions. Cycle 1 Lonsurf 02/27/2022       2.   Hypothyroid   3.    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  Port-A-Cath placement 09/29/2019, interventional radiology   5.  Neutropenia and thrombocytopenia following cycle 1 FOLFOX-plan chemotherapy dose reductions, she declined G-CSF   6.  Mucositis following cycle 5 FOLFOX, 5-fluorouracil dose reduced with cycle 6 Mucositis following cycle 22 5-fluorouracil-Magic mouthwash added   7.  Right lung airspace disease/volume  loss-likely toxicity from chest radiation, trial of prednisone 01/29/2020; dyspnea and cough improved 02/11/2020 Progressive cough 03/11/2020-prednisone resumed at a dose of 20 mg daily Cough and dyspnea improved 03/25/2020-prednisone taper to 15 mg daily Improved 04/06/2020-prednisone taper to 10 mg daily Stable 04/21/2020-prednisone taper  to 5 mg daily   8.  Gastroesophageal varices     Disposition: Samantha Lutz has metastatic rectal cancer.  There was evidence of disease progression on a CT last month.  She declined further treatment with 5-FU and panitumumab.  I recommend Lonsurf/bevacizumab.  Samantha Lutz declines treatment with bevacizumab.  We reviewed potential toxicities associated with bevacizumab again today.  She agrees to proceed with single agent Lonsurf.  We discussed side effects associated with Lonsurf including the chance of nausea, diarrhea, and hematologic toxicity.  The plan is to begin cycle 1 Lonsurf 02/27/2022.  She will return for an office and lab visit on 03/22/2022.  She will begin a magnesium supplement.  Betsy Coder, MD  02/22/2022  9:41 AM

## 2022-02-22 NOTE — Telephone Encounter (Signed)
Oral Oncology Patient Advocate Encounter   Began application for assistance for Lonsurf through [place] Patient [employer].   Application will be submitted upon completion of necessary supporting documentation.   [facility]ai[facility]'s phone number [phone].   I will continue to check the status until final determination.   Berdine Addison, [facility] Patient [place]  [phone] (phone) [phone] (fax) 02/22/2022 3:50 PM

## 2022-02-22 NOTE — Telephone Encounter (Signed)
Oral Oncology Patient Advocate Encounter  After completing a benefits investigation, prior authorization for Lonsurf is not required at this time through [place].  Patient's copay is $1,715.20.     Berdine Addison, [facility] Patient [place]  [phone] (phone) [phone] (fax) 02/22/2022 3:19 PM

## 2022-02-23 ENCOUNTER — Other Ambulatory Visit: Payer: Self-pay

## 2022-02-23 ENCOUNTER — Other Ambulatory Visit (HOSPITAL_COMMUNITY): Payer: Self-pay

## 2022-02-23 ENCOUNTER — Encounter: Payer: Self-pay | Admitting: Oncology

## 2022-02-23 ENCOUNTER — Telehealth: Payer: Self-pay

## 2022-02-23 NOTE — Telephone Encounter (Signed)
Oral Oncology Pharmacist Encounter  Received new prescription for Lonsurf (trifluridine/tipiracil) for the treatment of metastatic rectal cancer, planned duration until disease progression or unacceptable drug toxicity.  CMP and CBC from 02/22/2022 assessed, there are abnormal values present (elevated AST, ALT and total bilirubin), but no dose adjustment is currently needed. Prescription dose and frequency assessed.   Current medication list in Epic reviewed, there are no DDIs with Lonsurf identified:  Evaluated chart and there are no patient barriers to medication adherence identified.   Prescription has been e-scribed to the [facility] for benefits analysis and approval.  Oral Oncology Clinic will continue to follow for insurance authorization, copayment issues, initial counseling and start date.  Patient agreed to treatment on 02/22/2022 per MD documentation.  Laray Anger, PharmD PGY-2 Pharmacy Resident Hematology/Oncology [phone]  02/23/2022 2:06 PM

## 2022-02-23 NOTE — Telephone Encounter (Signed)
Called and left VM for patient to call me back. We need additional information to sign up for grants to keep copay at $0.

## 2022-02-23 NOTE — Telephone Encounter (Signed)
There are open grants so we will go that route with patient. I have left a VM for patient to call me back to obtain financial information to apply for open funding.

## 2022-02-23 NOTE — Telephone Encounter (Signed)
Oral Oncology Patient Advocate Encounter   Was successful in securing patient a $ 6,000 grant from [employer] to provide copayment coverage for Lonsurf.  The patient's out of pocket cost will be $5.00 monthly.     I have spoken with the patient.    The billing information is as follows and has been shared with [place].   Member ID: [id number] Group ID: [id number] RxBin: [id number] Dates of Eligibility: 02/23/22 through 01/30/23   Samantha Lutz, [place] Patient [place]  [phone] (phone) [phone] (fax) 02/23/2022 3:17 PM

## 2022-02-24 ENCOUNTER — Other Ambulatory Visit: Payer: Self-pay

## 2022-02-24 ENCOUNTER — Other Ambulatory Visit (HOSPITAL_COMMUNITY): Payer: Self-pay

## 2022-02-24 NOTE — Telephone Encounter (Signed)
Oral Chemotherapy Pharmacist Encounter  Patient plans on picking up her Lonsurf from [facility] on 02/25/22. She knows the plan is to get started on 02/27/22.  Patient Education I spoke with patient for overview of new oral chemotherapy medication: Lonsurf (trifluridine/tipiracil) for the treatment of metastatic rectal cancer, planned duration until disease progression or unacceptable drug toxicity. Planned start 02/27/22.  Pt is doing well. Counseled patient on administration, dosing, side effects, monitoring, drug-food interactions, safe handling, storage, and disposal. Patient will take 3 tablets (60 mg Trifluridine) twice daily, take within 1 hr after AM & PM meals on days 1-5, 8-12. Repeat every 28 days.   Side effects include but not limited to: diarrhea, nausea, decreased wbc, fatigue.    Reviewed with patient importance of keeping a medication schedule and plan for any missed doses.  After discussion with patient no patient barriers to medication adherence identified.   Samantha Lutz voiced understanding and appreciation. All questions answered. Medication handout and calendar provided.  Provided patient with [facility] phone number. Patient knows to call the office with questions or concerns. Oral Chemotherapy Navigation Clinic will continue to follow.  Darl Pikes, PharmD, BCPS, BCOP, CPP Hematology/Oncology Clinical Pharmacist Practitioner [place]/DB/AP [facility] [phone]  02/24/2022 10:49 AM

## 2022-03-03 ENCOUNTER — Other Ambulatory Visit: Payer: Self-pay

## 2022-03-08 ENCOUNTER — Other Ambulatory Visit (HOSPITAL_COMMUNITY): Payer: Self-pay

## 2022-03-09 ENCOUNTER — Other Ambulatory Visit (HOSPITAL_COMMUNITY): Payer: Self-pay

## 2022-03-12 ENCOUNTER — Other Ambulatory Visit: Payer: Self-pay

## 2022-03-14 ENCOUNTER — Other Ambulatory Visit (HOSPITAL_COMMUNITY): Payer: Self-pay

## 2022-03-14 ENCOUNTER — Other Ambulatory Visit: Payer: Self-pay | Admitting: Oncology

## 2022-03-14 MED ORDER — LONSURF 20-8.19 MG PO TABS
ORAL_TABLET | ORAL | 0 refills | Status: DC
  Filled 2022-03-21: qty 60, 28d supply, fill #0

## 2022-03-15 ENCOUNTER — Other Ambulatory Visit: Payer: Self-pay | Admitting: Nurse Practitioner

## 2022-03-15 DIAGNOSIS — C20 Malignant neoplasm of rectum: Secondary | ICD-10-CM

## 2022-03-16 ENCOUNTER — Other Ambulatory Visit (HOSPITAL_COMMUNITY): Payer: Self-pay

## 2022-03-16 ENCOUNTER — Telehealth: Payer: Self-pay

## 2022-03-16 ENCOUNTER — Encounter: Payer: Self-pay | Admitting: Oncology

## 2022-03-16 ENCOUNTER — Other Ambulatory Visit: Payer: Self-pay

## 2022-03-16 NOTE — Telephone Encounter (Signed)
Oral Oncology Patient Advocate Encounter   Was successful in securing patient a $4000 grant from Patient [place] (PAF) to provide copayment coverage for Lonsurf.  This will keep the out of pocket expense at $0.     The billing information is as follows and has been shared with [place].   RxBin: [id number] PCN:  [id number] Member ID: [id number] Group ID: [id number] Dates of Eligibility: 09/17/21 through 03/16/23  Berdine Addison, [facility] Patient [place]  [phone] (phone) [phone] (fax) 03/16/2022 1:24 PM

## 2022-03-16 NOTE — Telephone Encounter (Signed)
Left VM for patient to call me back to inform them of obtaining second grant for 2024

## 2022-03-17 NOTE — Telephone Encounter (Signed)
Patient returned my call and is now aware of second grant to keep cost of Lonsurf at $0 for the year.

## 2022-03-21 ENCOUNTER — Other Ambulatory Visit: Payer: Self-pay

## 2022-03-21 ENCOUNTER — Other Ambulatory Visit (HOSPITAL_COMMUNITY): Payer: Self-pay

## 2022-03-22 ENCOUNTER — Inpatient Hospital Stay: Payer: Medicare Other

## 2022-03-22 ENCOUNTER — Inpatient Hospital Stay: Payer: Medicare Other | Attending: Oncology

## 2022-03-22 ENCOUNTER — Inpatient Hospital Stay: Payer: Medicare Other | Admitting: Nurse Practitioner

## 2022-03-22 ENCOUNTER — Encounter: Payer: Self-pay | Admitting: Nurse Practitioner

## 2022-03-22 VITALS — BP 137/74 | HR 91 | Temp 98.1°F | Resp 18 | Ht 63.0 in | Wt 207.2 lb

## 2022-03-22 DIAGNOSIS — D649 Anemia, unspecified: Secondary | ICD-10-CM | POA: Diagnosis not present

## 2022-03-22 DIAGNOSIS — D709 Neutropenia, unspecified: Secondary | ICD-10-CM | POA: Diagnosis not present

## 2022-03-22 DIAGNOSIS — R0609 Other forms of dyspnea: Secondary | ICD-10-CM | POA: Insufficient documentation

## 2022-03-22 DIAGNOSIS — I864 Gastric varices: Secondary | ICD-10-CM | POA: Diagnosis not present

## 2022-03-22 DIAGNOSIS — Z923 Personal history of irradiation: Secondary | ICD-10-CM | POA: Insufficient documentation

## 2022-03-22 DIAGNOSIS — C20 Malignant neoplasm of rectum: Secondary | ICD-10-CM | POA: Diagnosis present

## 2022-03-22 DIAGNOSIS — D696 Thrombocytopenia, unspecified: Secondary | ICD-10-CM | POA: Diagnosis not present

## 2022-03-22 DIAGNOSIS — E039 Hypothyroidism, unspecified: Secondary | ICD-10-CM | POA: Insufficient documentation

## 2022-03-22 DIAGNOSIS — Z95828 Presence of other vascular implants and grafts: Secondary | ICD-10-CM

## 2022-03-22 DIAGNOSIS — K1230 Oral mucositis (ulcerative), unspecified: Secondary | ICD-10-CM | POA: Diagnosis not present

## 2022-03-22 LAB — CBC WITH DIFFERENTIAL (CANCER CENTER ONLY)
Abs Immature Granulocytes: 0 10*3/uL (ref 0.00–0.07)
Basophils Absolute: 0 10*3/uL (ref 0.0–0.1)
Basophils Relative: 0 %
Eosinophils Absolute: 0 10*3/uL (ref 0.0–0.5)
Eosinophils Relative: 3 %
HCT: 28.9 % — ABNORMAL LOW (ref 36.0–46.0)
Hemoglobin: 9.8 g/dL — ABNORMAL LOW (ref 12.0–15.0)
Immature Granulocytes: 0 %
Lymphocytes Relative: 35 %
Lymphs Abs: 0.4 10*3/uL — ABNORMAL LOW (ref 0.7–4.0)
MCH: 32.6 pg (ref 26.0–34.0)
MCHC: 33.9 g/dL (ref 30.0–36.0)
MCV: 96 fL (ref 80.0–100.0)
Monocytes Absolute: 0.4 10*3/uL (ref 0.1–1.0)
Monocytes Relative: 34 %
Neutro Abs: 0.3 10*3/uL — CL (ref 1.7–7.7)
Neutrophils Relative %: 28 %
Platelet Count: 113 10*3/uL — ABNORMAL LOW (ref 150–400)
RBC: 3.01 MIL/uL — ABNORMAL LOW (ref 3.87–5.11)
RDW: 14.4 % (ref 11.5–15.5)
WBC Count: 1.2 10*3/uL — ABNORMAL LOW (ref 4.0–10.5)
nRBC: 0 % (ref 0.0–0.2)

## 2022-03-22 LAB — CMP (CANCER CENTER ONLY)
ALT: 27 U/L (ref 0–44)
AST: 30 U/L (ref 15–41)
Albumin: 3.3 g/dL — ABNORMAL LOW (ref 3.5–5.0)
Alkaline Phosphatase: 119 U/L (ref 38–126)
Anion gap: 12 (ref 5–15)
BUN: 12 mg/dL (ref 8–23)
CO2: 23 mmol/L (ref 22–32)
Calcium: 8.6 mg/dL — ABNORMAL LOW (ref 8.9–10.3)
Chloride: 107 mmol/L (ref 98–111)
Creatinine: 0.69 mg/dL (ref 0.44–1.00)
GFR, Estimated: >60 mL/min (ref >60)
Glucose, Bld: 100 mg/dL — ABNORMAL HIGH (ref 70–99)
Potassium: 3.4 mmol/L — ABNORMAL LOW (ref 3.5–5.1)
Sodium: 142 mmol/L (ref 135–145)
Total Bilirubin: 1.4 mg/dL — ABNORMAL HIGH (ref 0.3–1.2)
Total Protein: 5.8 g/dL — ABNORMAL LOW (ref 6.5–8.1)

## 2022-03-22 LAB — MAGNESIUM: Magnesium: 1.7 mg/dL (ref 1.7–2.4)

## 2022-03-22 MED ORDER — CIPROFLOXACIN HCL 500 MG PO TABS: 500.0000 mg | ORAL_TABLET | Freq: Two times a day (BID) | ORAL | 0 refills | Status: AC

## 2022-03-22 MED ORDER — HEPARIN SOD (PORK) LOCK FLUSH 100 UNIT/ML IV SOLN
500.0000 [IU] | Freq: Once | INTRAVENOUS | Status: AC
  Administered 2022-03-22: 500 [IU]

## 2022-03-22 MED ORDER — SODIUM CHLORIDE 0.9% FLUSH
10.0000 mL | Freq: Once | INTRAVENOUS | Status: AC
  Administered 2022-03-22: 10 mL

## 2022-03-22 NOTE — Progress Notes (Signed)
[place] OFFICE PROGRESS NOTE   Diagnosis: Rectal cancer  INTERVAL HISTORY:   Samantha Lutz returns as scheduled.  She completed cycle 1 Lonsurf beginning 02/27/2022.  She declined treatment with bevacizumab.  She denies nausea/vomiting.  No mouth sores.  No diarrhea.  No rash.  No hand or foot pain or redness.  Stable dyspnea on exertion.  Occasional cough.  Objective:  Vital signs in last 24 hours:  Blood pressure 137/74, pulse 91, temperature 98.1 F (36.7 C), temperature source Oral, resp. rate 18, height 5' 3"$  (1.6 m), weight 207 lb 3.2 oz (94 kg), SpO2 98 %.    HEENT: No thrush or ulcers. Resp: Lungs clear bilaterally, diminished right lower lung field.  No respiratory distress. Cardio: Regular rate and rhythm. GI: No hepatosplenomegaly. Vascular: No leg edema. Neuro: Alert and oriented. Skin: No rash.  Skin is dry appearing. Port-A-Cath without erythema.   Lab Results:  Lab Results  Component Value Date   WBC 6.9 02/22/2022   HGB 12.7 02/22/2022   HCT 38.1 02/22/2022   MCV 97.2 02/22/2022   PLT 129 (L) 02/22/2022   NEUTROABS 4.7 02/22/2022    Imaging:  No results found.  Medications: I have reviewed the patient's current medications.  Assessment/Plan: Rectal cancer Mass at 7 cm from the anal verge on colonoscopy 08/09/2017, biopsy revealed invasive adenocarcinoma Staging CTs 08/17/2017-no evidence of metastatic disease, asymmetric thickening in the mid rectum MR pelvis 09/01/2017, T3N0 lesion beginning at 6.3 cm from the anal sphincter Radiation/Xeloda initiated 09/17/2017, completed 10/25/2017 Low anterior resection/diverting ileostomy 12/21/2017,ypT3,ypN1a tumor.  Lymphovascular invasion present, intact mismatch repair protein expression, treatment effect present (TRS 1).  Mismatch repair protein IHC normal; Foundation 1-KRAS/NRAS wild-type, microsatellite status and tumor mutational burden could not be determined. Cycle 1 adjuvant Xeloda  beginning 01/21/2018 Cycle 2 adjuvant Xeloda beginning 02/11/2018 Xeloda discontinued after cycle 2 secondary to patient preference Ileostomy takedown 07/10/2018 CTs 09/07/2018- multiple live small pulmonary nodules concerning for metastatic disease CT chest 12/10/2018-multiple bilateral lung nodules, some have increased in size CT chest 04/08/2019-mild enlargement of bilateral lung nodules Status post SBRT multiple lung nodules 05/06/2019, 05/08/2019, 05/13/2019 CT chest 08/28/2019-improvement and resolution in majority of right-sided pulmonary nodules, a superior segment right lower lobe nodule has increased, no new right-sided nodules.  Progressive enlargement of left-sided pulmonary nodules PET scan 09/09/2019-hypermetabolic right lower lobe nodule, 2 hypermetabolic left upper lobe nodules with an additional 0.7 cm left upper lobe nodule below PET resolution, groundglass opacity in the mid to right lower lobe with associated hypermetabolism consistent with postradiation change, hypermetabolic central segment 4A liver lesion Cycle 1 FOLFOX 10/14/2019 Cycle  2FOLFOX 11/11/2019, 5-FU and oxaliplatin dose reduced secondary to neutropenia and thrombocytopenia following cycle one, G-CSF declined Cycle 3 FOLFOX 11/26/2019 Cycle 4 FOLFOX 12/11/2019, oxaliplatin held due to neutropenia and thrombocytopenia Cycle 5 FOLFOX 12/31/2019 Cycle 6 FOLFOX 01/14/2020 (oxaliplatin held, 5-fluorouracil dose reduced due to mucositis) CT chest 01/27/2020-improvement in left lung nodules, progressive airspace disease with traction bronchiectasis throughout the right lung, previously noted right lung nodules are obscured, mildly enlarged right paratracheal lymph nodes-not hypermetabolic on prior PET, likely reactive Cycle 7 FOLFOX 01/28/2020 Cycle 8 FOLFOX 02/11/2020 (oxaliplatin held due to neutropenia and thrombocytopenia) Cycle 9 FOLFOX 02/25/2020 (oxaliplatin held due to thrombocytopenia) Cycle 10 FOLFOX 03/11/2020 (oxaliplatin  held secondary to neuropathy) Cycle 11 FOLFOX 03/25/2020 (oxaliplatin held due to neuropathy) CT chest 04/05/2020-no new or progressive findings.  Interval evolution of presumed postradiation scarring in the right perihilar lung with decrease in  the more diffuse groundglass opacity seen previously.  No substantial change in left lung nodules. 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  No definite CT findings to suggest recurrent tumor.  Stable small left upper lobe pulmonary nodules.  No new or progressive findings.  Stable small right paratracheal lymph nodes.  Stable area of irregular low-attenuation in hepatic segment 4A, site of known prior hepatic metastatic lesion.  No new or progressive findings. Cycle 18 5-fluorouracil 07/06/2020 Cycle 19 5-fluorouracil 07/20/2020 Cycle 20 5-fluorouracil 08/03/2020 Cycle 21 5-fluorouracil 08/17/2020  Cycle 22 5-fluorouracil 08/31/2020 Cycle 23 5-fluorouracil 09/14/2020 CT chest 09/24/2020-increased number and size of pulmonary nodules bilaterally.  Findings in the right upper lobe are concerning for potential lymphangitic spread of tumor.  Right upper lobe findings could also reflect evolving postradiation change. Cycle 1 FOLFIRI 10/12/2020 Cycle 2 FOLFIRI 11/08/2020, irinotecan dose reduced due to neutropenia and thrombocytopenia following cycle 1 Cycle 3 FOLFIRI 12/01/2020 Cycle 4 FOLFIRI 12/22/2020 CT chest 01/11/2021-similar bilateral pulmonary nodules.  Left upper lobe nodule has mildly decreased in size.  No new nodules. Cycle 5 FOLFIRI 01/12/2021 Cycle 6 FOLFIRI 02/03/2021 Cycle 7 FOLFIRI 03/01/2021 Cycle 8 FOLFIRI 03/22/2021 CT chest 04/08/2021-multiple small bilateral  lung nodules not significantly changed.  No new nodules.  Unchanged enlarged pretracheal nodes.  Increase in fibrotic consolidation of the perihilar and inferior right lung. Cycle 9 FOLFIRI 04/12/2021 05/04/2021 treatment held due to fatigue Cycle 10 FOLFIRI 05/18/2021 06/07/2021-treatment held secondary to neutropenia Cycle 11 FOLFIRI 06/14/2021 Cycle 12 FOLFIRI 07/05/2021 Cycle 13 FOLFIRI 07/27/2021, irinotecan held 14 FOLFIRI 08/16/2021, irinotecan held CT chest 09/02/2021-enlargement of several lung nodules, no new nodules, stable postradiation changes in the right lower lobe and right middle lobe, gastroesophageal varices identified Cycle one 5-FU/Panitumumab 11/01/2021 Cycle two 5-FU/panitumumab 11/29/2021-Solu-Medrol and Pepcid prophylaxis added after she developed a cough during the panitumumab with cycle 1 Cycle 3 5-FU/panitumumab 12/12/2021 Cycle four 5-FU/panitumumab 12/27/2021 CT chest 01/25/2022-mild progression of metastatic disease to the lungs with increased size but stable number of metastatic lesions. Cycle 1 Lonsurf 02/27/2022       2.   Hypothyroid   3.    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  Port-A-Cath placement 09/29/2019, interventional radiology   5.  Neutropenia and thrombocytopenia following cycle 1 FOLFOX-plan chemotherapy dose reductions, she declined G-CSF   6.  Mucositis following cycle 5 FOLFOX, 5-fluorouracil dose reduced with cycle 6 Mucositis following cycle 22 5-fluorouracil-Magic mouthwash added   7.  Right lung airspace disease/volume loss-likely toxicity from chest radiation, trial of prednisone 01/29/2020; dyspnea and cough improved 02/11/2020 Progressive cough 03/11/2020-prednisone resumed at a dose of 20 mg daily Cough and dyspnea improved 03/25/2020-prednisone taper to 15 mg daily Improved 04/06/2020-prednisone taper to 10 mg daily Stable 04/21/2020-prednisone taper to 5 mg daily   8.  Gastroesophageal varices      Disposition: Samantha Lutz appears stable.  She has completed 1 cycle of Lonsurf.  CBC from today shows neutropenia and progressive anemia.  Precautions reviewed.  She understands to contact the office with fever, chills, other signs of infection.  She will begin ciprofloxacin 500 mg twice daily.  We discussed the C. difficile colitis and tendon rupture associated with fluoroquinolones.  She agrees to proceed.  She will return for a follow-up CBC  on 03/27/2022.  If counts are adequate at that point we will give her direction on when to begin the next cycle of Lonsurf and at what dose.  She will return for lab, flush, office visit in 3 weeks.  We are available to see her sooner if needed.  Plan reviewed with Dr. Benay Spice.    Ned Card ANP/GNP-BC   03/22/2022  10:45 AM

## 2022-03-22 NOTE — Progress Notes (Signed)
CRITICAL VALUE STICKER  CRITICAL VALUE: ANC 0.3  RECEIVER (on-site recipient of call): [place] NOTIFIED:03/22/2022 [place] (representative from lab): Ninfa Meeker  MD NOTIFIED: Ned Card  TIME OF NOTIFICATION:1107  RESPONSE:  Patient and Samantha Lutz will discuss the next steps

## 2022-03-22 NOTE — Patient Instructions (Signed)

## 2022-03-23 ENCOUNTER — Other Ambulatory Visit: Payer: Self-pay

## 2022-03-27 ENCOUNTER — Telehealth: Payer: Self-pay

## 2022-03-27 ENCOUNTER — Inpatient Hospital Stay: Payer: Medicare Other

## 2022-03-27 DIAGNOSIS — Z95828 Presence of other vascular implants and grafts: Secondary | ICD-10-CM

## 2022-03-27 DIAGNOSIS — C20 Malignant neoplasm of rectum: Secondary | ICD-10-CM

## 2022-03-27 LAB — CBC WITH DIFFERENTIAL (CANCER CENTER ONLY)
Abs Immature Granulocytes: 0.02 10*3/uL (ref 0.00–0.07)
Basophils Absolute: 0 10*3/uL (ref 0.0–0.1)
Basophils Relative: 0 %
Eosinophils Absolute: 0 10*3/uL (ref 0.0–0.5)
Eosinophils Relative: 1 %
HCT: 30.3 % — ABNORMAL LOW (ref 36.0–46.0)
Hemoglobin: 10.3 g/dL — ABNORMAL LOW (ref 12.0–15.0)
Immature Granulocytes: 1 %
Lymphocytes Relative: 25 %
Lymphs Abs: 0.6 10*3/uL — ABNORMAL LOW (ref 0.7–4.0)
MCH: 33.4 pg (ref 26.0–34.0)
MCHC: 34 g/dL (ref 30.0–36.0)
MCV: 98.4 fL (ref 80.0–100.0)
Monocytes Absolute: 0.6 10*3/uL (ref 0.1–1.0)
Monocytes Relative: 25 %
Neutro Abs: 1.1 10*3/uL — ABNORMAL LOW (ref 1.7–7.7)
Neutrophils Relative %: 48 %
Platelet Count: 113 10*3/uL — ABNORMAL LOW (ref 150–400)
RBC: 3.08 MIL/uL — ABNORMAL LOW (ref 3.87–5.11)
RDW: 16.3 % — ABNORMAL HIGH (ref 11.5–15.5)
WBC Count: 2.3 10*3/uL — ABNORMAL LOW (ref 4.0–10.5)
nRBC: 0 % (ref 0.0–0.2)

## 2022-03-27 MED ORDER — SODIUM CHLORIDE 0.9% FLUSH
10.0000 mL | Freq: Once | INTRAVENOUS | Status: AC
  Administered 2022-03-27: 10 mL

## 2022-03-27 MED ORDER — HEPARIN SOD (PORK) LOCK FLUSH 100 UNIT/ML IV SOLN
500.0000 [IU] | Freq: Once | INTRAVENOUS | Status: AC
  Administered 2022-03-27: 500 [IU]

## 2022-03-27 NOTE — Telephone Encounter (Signed)
VM with instructions to restart Lonsurf at previous dose per Ned Card, NP.

## 2022-03-27 NOTE — Telephone Encounter (Signed)
-----   Message from Owens Shark, NP sent at 03/27/2022 10:08 AM EST ----- Please let her know white count is better.  She can stop the antibiotic.  Counts are still too low to begin the next cycle of Lonsurf.  Please schedule for a CBC in 1 week.

## 2022-03-27 NOTE — Telephone Encounter (Signed)
Attempted to call pt to clarify VM that was left earlier. No answer. MyChart message sent.

## 2022-03-27 NOTE — Telephone Encounter (Signed)
Spoke with pt to communicating the MyChart message sent earlier that day (see MyChart message). Pt verbalizes understanding and states that she will stop taking her Lonsurf. Pt verbalized understanding that the scheduling team will be calling her.

## 2022-03-29 ENCOUNTER — Other Ambulatory Visit: Payer: Self-pay | Admitting: Nurse Practitioner

## 2022-03-29 DIAGNOSIS — C20 Malignant neoplasm of rectum: Secondary | ICD-10-CM

## 2022-03-31 ENCOUNTER — Other Ambulatory Visit (HOSPITAL_COMMUNITY): Payer: Self-pay

## 2022-04-03 ENCOUNTER — Inpatient Hospital Stay: Payer: Medicare Other | Attending: Oncology

## 2022-04-03 ENCOUNTER — Other Ambulatory Visit: Payer: Self-pay

## 2022-04-03 DIAGNOSIS — C20 Malignant neoplasm of rectum: Secondary | ICD-10-CM | POA: Diagnosis present

## 2022-04-03 DIAGNOSIS — C787 Secondary malignant neoplasm of liver and intrahepatic bile duct: Secondary | ICD-10-CM | POA: Diagnosis not present

## 2022-04-03 DIAGNOSIS — C7801 Secondary malignant neoplasm of right lung: Secondary | ICD-10-CM | POA: Diagnosis not present

## 2022-04-03 DIAGNOSIS — K1230 Oral mucositis (ulcerative), unspecified: Secondary | ICD-10-CM | POA: Insufficient documentation

## 2022-04-03 DIAGNOSIS — I864 Gastric varices: Secondary | ICD-10-CM | POA: Insufficient documentation

## 2022-04-03 DIAGNOSIS — C7802 Secondary malignant neoplasm of left lung: Secondary | ICD-10-CM | POA: Insufficient documentation

## 2022-04-03 DIAGNOSIS — D696 Thrombocytopenia, unspecified: Secondary | ICD-10-CM | POA: Insufficient documentation

## 2022-04-03 DIAGNOSIS — E039 Hypothyroidism, unspecified: Secondary | ICD-10-CM | POA: Insufficient documentation

## 2022-04-03 LAB — CBC WITH DIFFERENTIAL (CANCER CENTER ONLY)
Abs Immature Granulocytes: 0.05 10*3/uL (ref 0.00–0.07)
Basophils Absolute: 0 10*3/uL (ref 0.0–0.1)
Basophils Relative: 0 %
Eosinophils Absolute: 0 10*3/uL (ref 0.0–0.5)
Eosinophils Relative: 0 %
HCT: 33.7 % — ABNORMAL LOW (ref 36.0–46.0)
Hemoglobin: 11.5 g/dL — ABNORMAL LOW (ref 12.0–15.0)
Immature Granulocytes: 1 %
Lymphocytes Relative: 13 %
Lymphs Abs: 0.6 10*3/uL — ABNORMAL LOW (ref 0.7–4.0)
MCH: 33.8 pg (ref 26.0–34.0)
MCHC: 34.1 g/dL (ref 30.0–36.0)
MCV: 99.1 fL (ref 80.0–100.0)
Monocytes Absolute: 0.8 10*3/uL (ref 0.1–1.0)
Monocytes Relative: 17 %
Neutro Abs: 3.3 10*3/uL (ref 1.7–7.7)
Neutrophils Relative %: 69 %
Platelet Count: 98 10*3/uL — ABNORMAL LOW (ref 150–400)
RBC: 3.4 MIL/uL — ABNORMAL LOW (ref 3.87–5.11)
RDW: 17.2 % — ABNORMAL HIGH (ref 11.5–15.5)
WBC Count: 4.8 10*3/uL (ref 4.0–10.5)
nRBC: 0 % (ref 0.0–0.2)

## 2022-04-04 ENCOUNTER — Other Ambulatory Visit (HOSPITAL_COMMUNITY): Payer: Self-pay

## 2022-04-04 ENCOUNTER — Telehealth: Payer: Self-pay | Admitting: *Deleted

## 2022-04-04 MED ORDER — LONSURF 20-8.19 MG PO TABS
ORAL_TABLET | ORAL | 0 refills | Status: DC
  Filled 2022-04-04: qty 40, fill #0

## 2022-04-04 NOTE — Telephone Encounter (Signed)
Per NP review: ANC/WBC is in normal range. OK to start next cycle of Lonsurf at a reduced dose of #2 tablets twice daily. OK to start this cycle on 3/11 if she prefers. New script w/dose change is being sent to her pharmacy. Also sent these directions via MyChart.

## 2022-04-13 ENCOUNTER — Inpatient Hospital Stay: Payer: Medicare Other | Admitting: Nurse Practitioner

## 2022-04-13 ENCOUNTER — Inpatient Hospital Stay: Payer: Medicare Other

## 2022-04-13 ENCOUNTER — Encounter: Payer: Self-pay | Admitting: Nurse Practitioner

## 2022-04-13 ENCOUNTER — Ambulatory Visit (HOSPITAL_BASED_OUTPATIENT_CLINIC_OR_DEPARTMENT_OTHER)
Admission: RE | Admit: 2022-04-13 | Discharge: 2022-04-13 | Disposition: A | Discharge status: Home / Self Care | Payer: Medicare Other | Source: Ambulatory Visit | Attending: Nurse Practitioner | Admitting: Nurse Practitioner

## 2022-04-13 VITALS — BP 152/64 | HR 91 | Temp 98.2°F | Resp 18 | Ht 63.0 in | Wt 210.8 lb

## 2022-04-13 DIAGNOSIS — C20 Malignant neoplasm of rectum: Secondary | ICD-10-CM

## 2022-04-13 LAB — CMP (CANCER CENTER ONLY)
ALT: 92 U/L — ABNORMAL HIGH (ref 0–44)
AST: 135 U/L — ABNORMAL HIGH (ref 15–41)
Albumin: 3.2 g/dL — ABNORMAL LOW (ref 3.5–5.0)
Alkaline Phosphatase: 161 U/L — ABNORMAL HIGH (ref 38–126)
Anion gap: 8 (ref 5–15)
BUN: 11 mg/dL (ref 8–23)
CO2: 23 mmol/L (ref 22–32)
Calcium: 8.6 mg/dL — ABNORMAL LOW (ref 8.9–10.3)
Chloride: 109 mmol/L (ref 98–111)
Creatinine: 0.77 mg/dL (ref 0.44–1.00)
GFR, Estimated: >60 mL/min (ref >60)
Glucose, Bld: 129 mg/dL — ABNORMAL HIGH (ref 70–99)
Potassium: 3.3 mmol/L — ABNORMAL LOW (ref 3.5–5.1)
Sodium: 140 mmol/L (ref 135–145)
Total Bilirubin: 6.7 mg/dL (ref 0.3–1.2)
Total Protein: 6 g/dL — ABNORMAL LOW (ref 6.5–8.1)

## 2022-04-13 LAB — CBC WITH DIFFERENTIAL (CANCER CENTER ONLY)
Abs Immature Granulocytes: 0.01 10*3/uL (ref 0.00–0.07)
Basophils Absolute: 0 10*3/uL (ref 0.0–0.1)
Basophils Relative: 1 %
Eosinophils Absolute: 0.1 10*3/uL (ref 0.0–0.5)
Eosinophils Relative: 2 %
HCT: 31.6 % — ABNORMAL LOW (ref 36.0–46.0)
Hemoglobin: 10.7 g/dL — ABNORMAL LOW (ref 12.0–15.0)
Immature Granulocytes: 0 %
Lymphocytes Relative: 12 %
Lymphs Abs: 0.5 10*3/uL — ABNORMAL LOW (ref 0.7–4.0)
MCH: 33.9 pg (ref 26.0–34.0)
MCHC: 33.9 g/dL (ref 30.0–36.0)
MCV: 100 fL (ref 80.0–100.0)
Monocytes Absolute: 0.5 10*3/uL (ref 0.1–1.0)
Monocytes Relative: 12 %
Neutro Abs: 2.9 10*3/uL (ref 1.7–7.7)
Neutrophils Relative %: 73 %
Platelet Count: 109 10*3/uL — ABNORMAL LOW (ref 150–400)
RBC: 3.16 MIL/uL — ABNORMAL LOW (ref 3.87–5.11)
RDW: 17.4 % — ABNORMAL HIGH (ref 11.5–15.5)
WBC Count: 3.9 10*3/uL — ABNORMAL LOW (ref 4.0–10.5)
nRBC: 0 % (ref 0.0–0.2)

## 2022-04-13 LAB — MAGNESIUM: Magnesium: 1.9 mg/dL (ref 1.7–2.4)

## 2022-04-13 MED ORDER — IOHEXOL 350 MG/ML SOLN
100.0000 mL | Freq: Once | INTRAVENOUS | Status: AC | PRN
  Administered 2022-04-13: 80 mL via INTRAVENOUS

## 2022-04-13 NOTE — Progress Notes (Signed)
CRITICAL VALUE STICKER  CRITICAL VALUE: T. Bili 6.7  RECEIVER (on-site recipient of call):Kebra Lowrimore,RN  DATE & TIME NOTIFIED: 04/13/22 @ 1132  MESSENGER (representative from lab):Phyllis  MD NOTIFIED: Ned Card, NP  TIME OF NOTIFICATION:1134  RESPONSE:

## 2022-04-13 NOTE — Progress Notes (Signed)
[place] OFFICE PROGRESS NOTE   Diagnosis: Rectal cancer  INTERVAL HISTORY:   Samantha Lutz returns as scheduled.  She began cycle 2 Lonsurf at a reduced dose on 04/10/2022.  As long as she takes Lonsurf with food she does not have nausea.  No mouth sores.  No diarrhea.  No abdominal pain.  No rash.  No hand or foot pain or redness.  She denies fever.  Stable dyspnea on exertion, slight increase in cough.  Objective:  Vital signs in last 24 hours:  Blood pressure (!) 152/64, pulse 91, temperature 98.2 F (36.8 C), temperature source Oral, resp. rate 18, height '5\' 3"'$  (1.6 m), weight 210 lb 12.8 oz (95.6 kg), SpO2 97 %.    HEENT: Scleral icterus.  No thrush or ulcers. Resp: Lungs clear bilaterally. Cardio: Regular rate and rhythm. GI: Abdomen soft and nontender.  No hepatosplenomegaly. Vascular: No leg edema. Neuro: Alert and oriented. Skin: Palms without erythema. Port-A-Cath without erythema.   Lab Results:  Lab Results  Component Value Date   WBC 3.9 (L) 04/13/2022   HGB 10.7 (L) 04/13/2022   HCT 31.6 (L) 04/13/2022   MCV 100.0 04/13/2022   PLT 109 (L) 04/13/2022   NEUTROABS 2.9 04/13/2022    Imaging:  No results found.  Medications: I have reviewed the patient's current medications.  Assessment/Plan:  Rectal cancer Mass at 7 cm from the anal verge on colonoscopy 08/09/2017, biopsy revealed invasive adenocarcinoma Staging CTs 08/17/2017-no evidence of metastatic disease, asymmetric thickening in the mid rectum MR pelvis 09/01/2017, T3N0 lesion beginning at 6.3 cm from the anal sphincter Radiation/Xeloda initiated 09/17/2017, completed 10/25/2017 Low anterior resection/diverting ileostomy 12/21/2017,ypT3,ypN1a tumor.  Lymphovascular invasion present, intact mismatch repair protein expression, treatment effect present (TRS 1).  Mismatch repair protein IHC normal; Foundation 1-KRAS/NRAS wild-type, microsatellite status and tumor mutational burden could  not be determined. Cycle 1 adjuvant Xeloda beginning 01/21/2018 Cycle 2 adjuvant Xeloda beginning 02/11/2018 Xeloda discontinued after cycle 2 secondary to patient preference Ileostomy takedown 07/10/2018 CTs 09/07/2018- multiple live small pulmonary nodules concerning for metastatic disease CT chest 12/10/2018-multiple bilateral lung nodules, some have increased in size CT chest 04/08/2019-mild enlargement of bilateral lung nodules Status post SBRT multiple lung nodules 05/06/2019, 05/08/2019, 05/13/2019 CT chest 08/28/2019-improvement and resolution in majority of right-sided pulmonary nodules, a superior segment right lower lobe nodule has increased, no new right-sided nodules.  Progressive enlargement of left-sided pulmonary nodules PET scan 09/09/2019-hypermetabolic right lower lobe nodule, 2 hypermetabolic left upper lobe nodules with an additional 0.7 cm left upper lobe nodule below PET resolution, groundglass opacity in the mid to right lower lobe with associated hypermetabolism consistent with postradiation change, hypermetabolic central segment 4A liver lesion Cycle 1 FOLFOX 10/14/2019 Cycle  2FOLFOX 11/11/2019, 5-FU and oxaliplatin dose reduced secondary to neutropenia and thrombocytopenia following cycle one, G-CSF declined Cycle 3 FOLFOX 11/26/2019 Cycle 4 FOLFOX 12/11/2019, oxaliplatin held due to neutropenia and thrombocytopenia Cycle 5 FOLFOX 12/31/2019 Cycle 6 FOLFOX 01/14/2020 (oxaliplatin held, 5-fluorouracil dose reduced due to mucositis) CT chest 01/27/2020-improvement in left lung nodules, progressive airspace disease with traction bronchiectasis throughout the right lung, previously noted right lung nodules are obscured, mildly enlarged right paratracheal lymph nodes-not hypermetabolic on prior PET, likely reactive Cycle 7 FOLFOX 01/28/2020 Cycle 8 FOLFOX 02/11/2020 (oxaliplatin held due to neutropenia and thrombocytopenia) Cycle 9 FOLFOX 02/25/2020 (oxaliplatin held due to  thrombocytopenia) Cycle 10 FOLFOX 03/11/2020 (oxaliplatin held secondary to neuropathy) Cycle 11 FOLFOX 03/25/2020 (oxaliplatin held due to neuropathy) CT chest 04/05/2020-no new or  progressive findings.  Interval evolution of presumed postradiation scarring in the right perihilar lung with decrease in the more diffuse groundglass opacity seen previously.  No substantial change in left lung nodules. 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  No definite CT findings to suggest recurrent tumor.  Stable small left upper lobe pulmonary nodules.  No new or progressive findings.  Stable small right paratracheal lymph nodes.  Stable area of irregular low-attenuation in hepatic segment 4A, site of known prior hepatic metastatic lesion.  No new or progressive findings. Cycle 18 5-fluorouracil 07/06/2020 Cycle 19 5-fluorouracil 07/20/2020 Cycle 20 5-fluorouracil 08/03/2020 Cycle 21 5-fluorouracil 08/17/2020  Cycle 22 5-fluorouracil 08/31/2020 Cycle 23 5-fluorouracil 09/14/2020 CT chest 09/24/2020-increased number and size of pulmonary nodules bilaterally.  Findings in the right upper lobe are concerning for potential lymphangitic spread of tumor.  Right upper lobe findings could also reflect evolving postradiation change. Cycle 1 FOLFIRI 10/12/2020 Cycle 2 FOLFIRI 11/08/2020, irinotecan dose reduced due to neutropenia and thrombocytopenia following cycle 1 Cycle 3 FOLFIRI 12/01/2020 Cycle 4 FOLFIRI 12/22/2020 CT chest 01/11/2021-similar bilateral pulmonary nodules.  Left upper lobe nodule has mildly decreased in size.  No new nodules. Cycle 5 FOLFIRI 01/12/2021 Cycle 6 FOLFIRI 02/03/2021 Cycle 7 FOLFIRI 03/01/2021 Cycle 8  FOLFIRI 03/22/2021 CT chest 04/08/2021-multiple small bilateral lung nodules not significantly changed.  No new nodules.  Unchanged enlarged pretracheal nodes.  Increase in fibrotic consolidation of the perihilar and inferior right lung. Cycle 9 FOLFIRI 04/12/2021 05/04/2021 treatment held due to fatigue Cycle 10 FOLFIRI 05/18/2021 06/07/2021-treatment held secondary to neutropenia Cycle 11 FOLFIRI 06/14/2021 Cycle 12 FOLFIRI 07/05/2021 Cycle 13 FOLFIRI 07/27/2021, irinotecan held 14 FOLFIRI 08/16/2021, irinotecan held CT chest 09/02/2021-enlargement of several lung nodules, no new nodules, stable postradiation changes in the right lower lobe and right middle lobe, gastroesophageal varices identified Cycle one 5-FU/Panitumumab 11/01/2021 Cycle two 5-FU/panitumumab 11/29/2021-Solu-Medrol and Pepcid prophylaxis added after she developed a cough during the panitumumab with cycle 1 Cycle 3 5-FU/panitumumab 12/12/2021 Cycle four 5-FU/panitumumab 12/27/2021 CT chest 01/25/2022-mild progression of metastatic disease to the lungs with increased size but stable number of metastatic lesions. Cycle 1 Lonsurf 02/27/2022 Cycle 2 Lonsurf 04/10/2022, placed on hold 04/13/2022 due to LFT abnormalities CT abdomen/pelvis 04/13/2022-persistent/chronic intrahepatic biliary dilatation most notable left hepatic lobe; vague area of low-attenuation near the porta hepatis, MRI recommended.       2.   Hypothyroid   3.    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  Port-A-Cath placement 09/29/2019, interventional radiology   5.  Neutropenia and thrombocytopenia following cycle 1 FOLFOX-plan chemotherapy dose reductions, she declined G-CSF   6.  Mucositis following cycle 5 FOLFOX, 5-fluorouracil dose reduced with cycle 6 Mucositis following cycle 22 5-fluorouracil-Magic mouthwash added   7.  Right lung airspace disease/volume loss-likely toxicity from chest radiation, trial of prednisone  01/29/2020; dyspnea and cough improved 02/11/2020 Progressive cough 03/11/2020-prednisone resumed at a dose of 20 mg daily Cough and dyspnea improved 03/25/2020-prednisone taper to 15 mg daily Improved 04/06/2020-prednisone taper to 10 mg daily Stable 04/21/2020-prednisone taper to 5 mg daily   8.  Gastroesophageal varices   Disposition: Ms. Mieles appears stable.  She began cycle 2 Lonsurf at a reduced dose on 04/10/2022.  Labs today unexpectedly show new LFT abnormalities including  total bilirubin of 6.7.  Lonsurf placed on hold.  We referred her for stat CT scan abdomen/pelvis which showed persistent/chronic intrahepatic biliary dilatation most notable in the left hepatic lobe.  There was a vague area of low-attenuation near the porta hepatis.  Referral made for urgent MRI to further evaluate.  Dr. Benay Spice reviewed her case with Dr. Loletha Carrow.  We will forward the MRI result once available.  Ms. Harer left after the CT scan.  I tried to contact her with the CT result and recommendation for an MRI.  She did not answer her phone and I was unable to leave a message.  We will continue to try and call her with the above information as well as to proceed to the emergency department if she gets a fever.  She will return for lab and follow-up 04/17/2022.  Plan reviewed with Dr. Benay Spice.    Ned Card ANP/GNP-BC   04/13/2022  11:26 AM

## 2022-04-14 ENCOUNTER — Ambulatory Visit (HOSPITAL_BASED_OUTPATIENT_CLINIC_OR_DEPARTMENT_OTHER)
Admission: RE | Admit: 2022-04-14 | Discharge: 2022-04-14 | Disposition: A | Discharge status: Home / Self Care | Payer: Medicare Other | Source: Ambulatory Visit | Attending: Nurse Practitioner | Admitting: Nurse Practitioner

## 2022-04-14 ENCOUNTER — Other Ambulatory Visit: Payer: Self-pay | Admitting: Nurse Practitioner

## 2022-04-14 ENCOUNTER — Telehealth: Payer: Self-pay

## 2022-04-14 DIAGNOSIS — C20 Malignant neoplasm of rectum: Secondary | ICD-10-CM

## 2022-04-14 DIAGNOSIS — C787 Secondary malignant neoplasm of liver and intrahepatic bile duct: Secondary | ICD-10-CM | POA: Diagnosis not present

## 2022-04-14 DIAGNOSIS — K831 Obstruction of bile duct: Secondary | ICD-10-CM | POA: Diagnosis not present

## 2022-04-14 MED ORDER — GADOBUTROL 1 MMOL/ML IV SOLN
9.5000 mL | Freq: Once | INTRAVENOUS | Status: AC | PRN
  Administered 2022-04-14: 9.5 mL via INTRAVENOUS
  Filled 2022-04-14: qty 10

## 2022-04-14 MED ORDER — CIPROFLOXACIN HCL 250 MG PO TABS: 250.0000 mg | ORAL_TABLET | Freq: Two times a day (BID) | ORAL | 0 refills | Status: DC

## 2022-04-14 NOTE — Telephone Encounter (Signed)
-----   Message from Owens Shark, NP sent at 04/14/2022  8:16 AM EDT ----- Please let her know we are sending an antibiotic to her pharmacy. She should begin this and go to ER if she gets a fever.

## 2022-04-14 NOTE — Telephone Encounter (Signed)
I called the patient several times no answer. I left a message and sent a my chart message.

## 2022-04-17 ENCOUNTER — Inpatient Hospital Stay: Payer: Medicare Other

## 2022-04-17 ENCOUNTER — Other Ambulatory Visit: Payer: Self-pay

## 2022-04-17 ENCOUNTER — Inpatient Hospital Stay: Payer: Medicare Other | Admitting: Nurse Practitioner

## 2022-04-17 ENCOUNTER — Encounter: Payer: Self-pay | Admitting: Nurse Practitioner

## 2022-04-17 ENCOUNTER — Encounter (HOSPITAL_COMMUNITY): Payer: Self-pay

## 2022-04-17 ENCOUNTER — Other Ambulatory Visit: Payer: Medicare Other

## 2022-04-17 ENCOUNTER — Inpatient Hospital Stay (HOSPITAL_COMMUNITY)
Admission: AD | Admit: 2022-04-17 | Discharge: 2022-05-04 | DRG: 408 | Disposition: A | Discharge status: Home / Self Care | Payer: Medicare Other | Source: Ambulatory Visit | Attending: Family Medicine | Admitting: Family Medicine

## 2022-04-17 VITALS — BP 150/87 | HR 89 | Temp 98.1°F | Resp 18 | Ht 63.0 in | Wt 201.4 lb

## 2022-04-17 DIAGNOSIS — Y848 Other medical procedures as the cause of abnormal reaction of the patient, or of later complication, without mention of misadventure at the time of the procedure: Secondary | ICD-10-CM | POA: Diagnosis not present

## 2022-04-17 DIAGNOSIS — Z9221 Personal history of antineoplastic chemotherapy: Secondary | ICD-10-CM

## 2022-04-17 DIAGNOSIS — K859 Acute pancreatitis without necrosis or infection, unspecified: Secondary | ICD-10-CM | POA: Diagnosis not present

## 2022-04-17 DIAGNOSIS — I700 Atherosclerosis of aorta: Secondary | ICD-10-CM | POA: Diagnosis present

## 2022-04-17 DIAGNOSIS — C7800 Secondary malignant neoplasm of unspecified lung: Secondary | ICD-10-CM | POA: Diagnosis present

## 2022-04-17 DIAGNOSIS — D539 Nutritional anemia, unspecified: Secondary | ICD-10-CM | POA: Diagnosis present

## 2022-04-17 DIAGNOSIS — E039 Hypothyroidism, unspecified: Secondary | ICD-10-CM | POA: Diagnosis present

## 2022-04-17 DIAGNOSIS — I10 Essential (primary) hypertension: Secondary | ICD-10-CM | POA: Diagnosis present

## 2022-04-17 DIAGNOSIS — R188 Other ascites: Secondary | ICD-10-CM | POA: Diagnosis not present

## 2022-04-17 DIAGNOSIS — I868 Varicose veins of other specified sites: Secondary | ICD-10-CM | POA: Diagnosis present

## 2022-04-17 DIAGNOSIS — K831 Obstruction of bile duct: Secondary | ICD-10-CM | POA: Diagnosis not present

## 2022-04-17 DIAGNOSIS — E8770 Fluid overload, unspecified: Secondary | ICD-10-CM | POA: Diagnosis not present

## 2022-04-17 DIAGNOSIS — Z888 Allergy status to other drugs, medicaments and biological substances status: Secondary | ICD-10-CM

## 2022-04-17 DIAGNOSIS — Z83511 Family history of glaucoma: Secondary | ICD-10-CM

## 2022-04-17 DIAGNOSIS — E871 Hypo-osmolality and hyponatremia: Secondary | ICD-10-CM | POA: Diagnosis not present

## 2022-04-17 DIAGNOSIS — K219 Gastro-esophageal reflux disease without esophagitis: Secondary | ICD-10-CM | POA: Diagnosis present

## 2022-04-17 DIAGNOSIS — C20 Malignant neoplasm of rectum: Secondary | ICD-10-CM

## 2022-04-17 DIAGNOSIS — Z79899 Other long term (current) drug therapy: Secondary | ICD-10-CM

## 2022-04-17 DIAGNOSIS — M4854XA Collapsed vertebra, not elsewhere classified, thoracic region, initial encounter for fracture: Secondary | ICD-10-CM | POA: Diagnosis present

## 2022-04-17 DIAGNOSIS — Z823 Family history of stroke: Secondary | ICD-10-CM

## 2022-04-17 DIAGNOSIS — Z923 Personal history of irradiation: Secondary | ICD-10-CM

## 2022-04-17 DIAGNOSIS — L900 Lichen sclerosus et atrophicus: Secondary | ICD-10-CM | POA: Diagnosis present

## 2022-04-17 DIAGNOSIS — T451X5A Adverse effect of antineoplastic and immunosuppressive drugs, initial encounter: Secondary | ICD-10-CM | POA: Diagnosis present

## 2022-04-17 DIAGNOSIS — R008 Other abnormalities of heart beat: Secondary | ICD-10-CM | POA: Diagnosis not present

## 2022-04-17 DIAGNOSIS — D6959 Other secondary thrombocytopenia: Secondary | ICD-10-CM | POA: Diagnosis present

## 2022-04-17 DIAGNOSIS — I8500 Esophageal varices without bleeding: Secondary | ICD-10-CM | POA: Diagnosis present

## 2022-04-17 DIAGNOSIS — K766 Portal hypertension: Secondary | ICD-10-CM | POA: Diagnosis present

## 2022-04-17 DIAGNOSIS — J449 Chronic obstructive pulmonary disease, unspecified: Secondary | ICD-10-CM | POA: Diagnosis not present

## 2022-04-17 DIAGNOSIS — C787 Secondary malignant neoplasm of liver and intrahepatic bile duct: Principal | ICD-10-CM | POA: Diagnosis present

## 2022-04-17 DIAGNOSIS — Z79890 Hormone replacement therapy (postmenopausal): Secondary | ICD-10-CM

## 2022-04-17 DIAGNOSIS — K9189 Other postprocedural complications and disorders of digestive system: Secondary | ICD-10-CM | POA: Diagnosis not present

## 2022-04-17 DIAGNOSIS — J90 Pleural effusion, not elsewhere classified: Secondary | ICD-10-CM | POA: Diagnosis not present

## 2022-04-17 DIAGNOSIS — T183XXA Foreign body in small intestine, initial encounter: Secondary | ICD-10-CM | POA: Diagnosis not present

## 2022-04-17 DIAGNOSIS — E669 Obesity, unspecified: Secondary | ICD-10-CM | POA: Diagnosis present

## 2022-04-17 DIAGNOSIS — Z8500 Personal history of malignant neoplasm of unspecified digestive organ: Secondary | ICD-10-CM | POA: Diagnosis not present

## 2022-04-17 DIAGNOSIS — D638 Anemia in other chronic diseases classified elsewhere: Secondary | ICD-10-CM | POA: Diagnosis not present

## 2022-04-17 DIAGNOSIS — Z833 Family history of diabetes mellitus: Secondary | ICD-10-CM

## 2022-04-17 DIAGNOSIS — I864 Gastric varices: Secondary | ICD-10-CM | POA: Diagnosis present

## 2022-04-17 DIAGNOSIS — K760 Fatty (change of) liver, not elsewhere classified: Secondary | ICD-10-CM | POA: Diagnosis present

## 2022-04-17 DIAGNOSIS — K8001 Calculus of gallbladder with acute cholecystitis with obstruction: Secondary | ICD-10-CM | POA: Diagnosis present

## 2022-04-17 DIAGNOSIS — Z825 Family history of asthma and other chronic lower respiratory diseases: Secondary | ICD-10-CM

## 2022-04-17 DIAGNOSIS — D61810 Antineoplastic chemotherapy induced pancytopenia: Secondary | ICD-10-CM | POA: Diagnosis present

## 2022-04-17 DIAGNOSIS — Z91040 Latex allergy status: Secondary | ICD-10-CM

## 2022-04-17 DIAGNOSIS — Z803 Family history of malignant neoplasm of breast: Secondary | ICD-10-CM

## 2022-04-17 DIAGNOSIS — C799 Secondary malignant neoplasm of unspecified site: Secondary | ICD-10-CM | POA: Diagnosis not present

## 2022-04-17 DIAGNOSIS — T859XXA Unspecified complication of internal prosthetic device, implant and graft, initial encounter: Secondary | ICD-10-CM

## 2022-04-17 DIAGNOSIS — D649 Anemia, unspecified: Secondary | ICD-10-CM | POA: Diagnosis not present

## 2022-04-17 DIAGNOSIS — T859XXD Unspecified complication of internal prosthetic device, implant and graft, subsequent encounter: Secondary | ICD-10-CM | POA: Diagnosis not present

## 2022-04-17 DIAGNOSIS — Z8261 Family history of arthritis: Secondary | ICD-10-CM

## 2022-04-17 DIAGNOSIS — K8021 Calculus of gallbladder without cholecystitis with obstruction: Secondary | ICD-10-CM | POA: Diagnosis not present

## 2022-04-17 DIAGNOSIS — Z8249 Family history of ischemic heart disease and other diseases of the circulatory system: Secondary | ICD-10-CM

## 2022-04-17 DIAGNOSIS — Z6838 Body mass index (BMI) 38.0-38.9, adult: Secondary | ICD-10-CM

## 2022-04-17 LAB — PROTIME-INR
INR: 1.1 (ref 0.8–1.2)
Prothrombin Time: 13.9 seconds (ref 11.4–15.2)

## 2022-04-17 LAB — CBC
HCT: 30.2 % — ABNORMAL LOW (ref 36.0–46.0)
Hemoglobin: 9.9 g/dL — ABNORMAL LOW (ref 12.0–15.0)
MCH: 33.6 pg (ref 26.0–34.0)
MCHC: 32.8 g/dL (ref 30.0–36.0)
MCV: 102.4 fL — ABNORMAL HIGH (ref 80.0–100.0)
Platelets: 109 10*3/uL — ABNORMAL LOW (ref 150–400)
RBC: 2.95 MIL/uL — ABNORMAL LOW (ref 3.87–5.11)
RDW: 17.8 % — ABNORMAL HIGH (ref 11.5–15.5)
WBC: 3.6 10*3/uL — ABNORMAL LOW (ref 4.0–10.5)
nRBC: 0 % (ref 0.0–0.2)

## 2022-04-17 LAB — CMP (CANCER CENTER ONLY)
ALT: 103 U/L — ABNORMAL HIGH (ref 0–44)
AST: 134 U/L — ABNORMAL HIGH (ref 15–41)
Albumin: 3.5 g/dL (ref 3.5–5.0)
Alkaline Phosphatase: 215 U/L — ABNORMAL HIGH (ref 38–126)
Anion gap: 9 (ref 5–15)
BUN: 13 mg/dL (ref 8–23)
CO2: 24 mmol/L (ref 22–32)
Calcium: 9.2 mg/dL (ref 8.9–10.3)
Chloride: 108 mmol/L (ref 98–111)
Creatinine: 0.81 mg/dL (ref 0.44–1.00)
GFR, Estimated: >60 mL/min (ref >60)
Glucose, Bld: 101 mg/dL — ABNORMAL HIGH (ref 70–99)
Potassium: 3.7 mmol/L (ref 3.5–5.1)
Sodium: 141 mmol/L (ref 135–145)
Total Bilirubin: 8 mg/dL (ref 0.3–1.2)
Total Protein: 6.4 g/dL — ABNORMAL LOW (ref 6.5–8.1)

## 2022-04-17 LAB — CBC WITH DIFFERENTIAL/PLATELET
Abs Immature Granulocytes: 0.04 10*3/uL (ref 0.00–0.07)
Basophils Absolute: 0 10*3/uL (ref 0.0–0.1)
Basophils Relative: 1 %
Eosinophils Absolute: 0.1 10*3/uL (ref 0.0–0.5)
Eosinophils Relative: 2 %
HCT: 32.7 % — ABNORMAL LOW (ref 36.0–46.0)
Hemoglobin: 10.7 g/dL — ABNORMAL LOW (ref 12.0–15.0)
Immature Granulocytes: 1 %
Lymphocytes Relative: 12 %
Lymphs Abs: 0.6 10*3/uL — ABNORMAL LOW (ref 0.7–4.0)
MCH: 33.5 pg (ref 26.0–34.0)
MCHC: 32.7 g/dL (ref 30.0–36.0)
MCV: 102.5 fL — ABNORMAL HIGH (ref 80.0–100.0)
Monocytes Absolute: 0.6 10*3/uL (ref 0.1–1.0)
Monocytes Relative: 12 %
Neutro Abs: 3.6 10*3/uL (ref 1.7–7.7)
Neutrophils Relative %: 72 %
Platelets: 111 10*3/uL — ABNORMAL LOW (ref 150–400)
RBC: 3.19 MIL/uL — ABNORMAL LOW (ref 3.87–5.11)
RDW: 17.9 % — ABNORMAL HIGH (ref 11.5–15.5)
WBC: 5 10*3/uL (ref 4.0–10.5)
nRBC: 0 % (ref 0.0–0.2)

## 2022-04-17 LAB — CREATININE, SERUM
Creatinine, Ser: 0.51 mg/dL (ref 0.44–1.00)
GFR, Estimated: >60 mL/min (ref >60)

## 2022-04-17 MED ORDER — ONDANSETRON HCL 4 MG/2ML IJ SOLN
4.0000 mg | Freq: Four times a day (QID) | INTRAMUSCULAR | Status: DC | PRN
  Administered 2022-04-18 – 2022-04-20 (×5): 4 mg via INTRAVENOUS
  Filled 2022-04-17 (×5): qty 2

## 2022-04-17 MED ORDER — PREDNISONE 5 MG PO TABS
5.0000 mg | ORAL_TABLET | Freq: Every day | ORAL | Status: DC
  Administered 2022-04-18 – 2022-05-04 (×16): 5 mg via ORAL
  Filled 2022-04-17 (×16): qty 1

## 2022-04-17 MED ORDER — ACETAMINOPHEN 325 MG PO TABS
650.0000 mg | ORAL_TABLET | Freq: Four times a day (QID) | ORAL | Status: DC | PRN
  Administered 2022-04-20 – 2022-04-27 (×4): 650 mg via ORAL
  Filled 2022-04-17 (×4): qty 2

## 2022-04-17 MED ORDER — HEPARIN SOD (PORK) LOCK FLUSH 100 UNIT/ML IV SOLN: 500.0000 [IU] | Freq: Once | INTRAVENOUS | Status: AC

## 2022-04-17 MED ORDER — OXYCODONE HCL 5 MG PO TABS
5.0000 mg | ORAL_TABLET | ORAL | Status: DC | PRN
  Administered 2022-04-19 – 2022-05-04 (×31): 5 mg via ORAL
  Filled 2022-04-17 (×32): qty 1

## 2022-04-17 MED ORDER — ONDANSETRON HCL 4 MG PO TABS: 4.0000 mg | ORAL_TABLET | Freq: Four times a day (QID) | ORAL | Status: DC | PRN

## 2022-04-17 MED ORDER — SODIUM CHLORIDE 0.9% FLUSH: 10.0000 mL | Freq: Once | INTRAVENOUS | Status: AC

## 2022-04-17 MED ORDER — POTASSIUM CHLORIDE IN NACL 20-0.9 MEQ/L-% IV SOLN
INTRAVENOUS | Status: DC
  Filled 2022-04-17 (×2): qty 1000

## 2022-04-17 MED ORDER — ENOXAPARIN SODIUM 40 MG/0.4ML IJ SOSY
40.0000 mg | PREFILLED_SYRINGE | INTRAMUSCULAR | Status: DC
  Administered 2022-04-17: 40 mg via SUBCUTANEOUS
  Filled 2022-04-17: qty 0.4

## 2022-04-17 MED ORDER — ACETAMINOPHEN 650 MG RE SUPP: 650.0000 mg | Freq: Four times a day (QID) | RECTAL | Status: DC | PRN

## 2022-04-17 MED ORDER — LEVOTHYROXINE SODIUM 50 MCG PO TABS
50.0000 ug | ORAL_TABLET | Freq: Every day | ORAL | Status: DC
  Administered 2022-04-18 – 2022-05-04 (×15): 50 ug via ORAL
  Filled 2022-04-17 (×16): qty 1

## 2022-04-17 MED ORDER — TRAZODONE HCL 50 MG PO TABS: 25.0000 mg | ORAL_TABLET | Freq: Every evening | ORAL | Status: DC | PRN

## 2022-04-17 NOTE — Progress Notes (Signed)
[facility] OFFICE PROGRESS NOTE   Diagnosis: Rectal cancer  INTERVAL HISTORY:   Samantha Lutz returns as scheduled.  Cycle 2 Lonsurf was placed on hold 04/13/2022 due to hyperbilirubinemia.  MRI liver on 04/14/2022 showed a hypoenhancing 2.7 x 1.9 cm central liver mass in the region of the biliary hilum compatible with solitary liver metastasis, associated high-grade malignant biliary hilar stricture.  Ciprofloxacin was prescribed.  She has not started Cipro due to concern over potential side effects.  No fever or chills.  No nausea or vomiting.  In retrospect she has noticed intermittent right-sided abdominal pain for the past few weeks.  Last night the pain became more consistent.  Objective:  Vital signs in last 24 hours:  Blood pressure (!) 150/87, pulse 89, temperature 98.1 F (36.7 C), temperature source Oral, resp. rate 18, height 5\' 3"  (1.6 m), weight 201 lb 6.4 oz (91.4 kg), SpO2 100 %.    HEENT: Scleral icterus.  No thrush or ulcers. Resp: Lungs clear bilaterally. Cardio: Regular rate and rhythm. GI: Abdomen is soft, mild tenderness at the right upper abdomen.  No hepatosplenomegaly. Vascular: No leg edema. Neuro: Alert and oriented. Skin: She appears jaundice. Port-A-Cath without erythema.   Lab Results:  Lab Results  Component Value Date   WBC 3.9 (L) 04/13/2022   HGB 10.7 (L) 04/13/2022   HCT 31.6 (L) 04/13/2022   MCV 100.0 04/13/2022   PLT 109 (L) 04/13/2022   NEUTROABS 2.9 04/13/2022    Imaging:  No results found.  Medications: I have reviewed the patient's current medications.  Assessment/Plan: Rectal cancer Mass at 7 cm from the anal verge on colonoscopy 08/09/2017, biopsy revealed invasive adenocarcinoma Staging CTs 08/17/2017-no evidence of metastatic disease, asymmetric thickening in the mid rectum MR pelvis 09/01/2017, T3N0 lesion beginning at 6.3 cm from the anal sphincter Radiation/Xeloda initiated 09/17/2017, completed  10/25/2017 Low anterior resection/diverting ileostomy 12/21/2017,ypT3,ypN1a tumor.  Lymphovascular invasion present, intact mismatch repair protein expression, treatment effect present (TRS 1).  Mismatch repair protein IHC normal; Foundation 1-KRAS/NRAS wild-type, microsatellite status and tumor mutational burden could not be determined. Cycle 1 adjuvant Xeloda beginning 01/21/2018 Cycle 2 adjuvant Xeloda beginning 02/11/2018 Xeloda discontinued after cycle 2 secondary to patient preference Ileostomy takedown 07/10/2018 CTs 09/07/2018- multiple live small pulmonary nodules concerning for metastatic disease CT chest 12/10/2018-multiple bilateral lung nodules, some have increased in size CT chest 04/08/2019-mild enlargement of bilateral lung nodules Status post SBRT multiple lung nodules 05/06/2019, 05/08/2019, 05/13/2019 CT chest 08/28/2019-improvement and resolution in majority of right-sided pulmonary nodules, a superior segment right lower lobe nodule has increased, no new right-sided nodules.  Progressive enlargement of left-sided pulmonary nodules PET scan 09/09/2019-hypermetabolic right lower lobe nodule, 2 hypermetabolic left upper lobe nodules with an additional 0.7 cm left upper lobe nodule below PET resolution, groundglass opacity in the mid to right lower lobe with associated hypermetabolism consistent with postradiation change, hypermetabolic central segment 4A liver lesion Cycle 1 FOLFOX 10/14/2019 Cycle  2FOLFOX 11/11/2019, 5-FU and oxaliplatin dose reduced secondary to neutropenia and thrombocytopenia following cycle one, G-CSF declined Cycle 3 FOLFOX 11/26/2019 Cycle 4 FOLFOX 12/11/2019, oxaliplatin held due to neutropenia and thrombocytopenia Cycle 5 FOLFOX 12/31/2019 Cycle 6 FOLFOX 01/14/2020 (oxaliplatin held, 5-fluorouracil dose reduced due to mucositis) CT chest 01/27/2020-improvement in left lung nodules, progressive airspace disease with traction bronchiectasis throughout the right lung,  previously noted right lung nodules are obscured, mildly enlarged right paratracheal lymph nodes-not hypermetabolic on prior PET, likely reactive Cycle 7 FOLFOX 01/28/2020 Cycle 8 FOLFOX 02/11/2020 (  oxaliplatin held due to neutropenia and thrombocytopenia) Cycle 9 FOLFOX 02/25/2020 (oxaliplatin held due to thrombocytopenia) Cycle 10 FOLFOX 03/11/2020 (oxaliplatin held secondary to neuropathy) Cycle 11 FOLFOX 03/25/2020 (oxaliplatin held due to neuropathy) CT chest 04/05/2020-no new or progressive findings.  Interval evolution of presumed postradiation scarring in the right perihilar lung with decrease in the more diffuse groundglass opacity seen previously.  No substantial change in left lung nodules. 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  No definite CT findings to suggest recurrent tumor.  Stable small left upper lobe pulmonary nodules.  No new or progressive findings.  Stable small right paratracheal lymph nodes.  Stable area of irregular low-attenuation in hepatic segment 4A, site of known prior hepatic metastatic lesion.  No new or progressive findings. Cycle 18 5-fluorouracil 07/06/2020 Cycle 19 5-fluorouracil 07/20/2020 Cycle 20 5-fluorouracil 08/03/2020 Cycle 21 5-fluorouracil 08/17/2020  Cycle 22 5-fluorouracil 08/31/2020 Cycle 23 5-fluorouracil 09/14/2020 CT chest 09/24/2020-increased number and size of pulmonary nodules bilaterally.  Findings in the right upper lobe are concerning for potential lymphangitic spread of tumor.  Right upper lobe findings could also reflect evolving postradiation change. Cycle 1 FOLFIRI 10/12/2020 Cycle 2 FOLFIRI 11/08/2020, irinotecan dose reduced due to neutropenia and  thrombocytopenia following cycle 1 Cycle 3 FOLFIRI 12/01/2020 Cycle 4 FOLFIRI 12/22/2020 CT chest 01/11/2021-similar bilateral pulmonary nodules.  Left upper lobe nodule has mildly decreased in size.  No new nodules. Cycle 5 FOLFIRI 01/12/2021 Cycle 6 FOLFIRI 02/03/2021 Cycle 7 FOLFIRI 03/01/2021 Cycle 8 FOLFIRI 03/22/2021 CT chest 04/08/2021-multiple small bilateral lung nodules not significantly changed.  No new nodules.  Unchanged enlarged pretracheal nodes.  Increase in fibrotic consolidation of the perihilar and inferior right lung. Cycle 9 FOLFIRI 04/12/2021 05/04/2021 treatment held due to fatigue Cycle 10 FOLFIRI 05/18/2021 06/07/2021-treatment held secondary to neutropenia Cycle 11 FOLFIRI 06/14/2021 Cycle 12 FOLFIRI 07/05/2021 Cycle 13 FOLFIRI 07/27/2021, irinotecan held 14 FOLFIRI 08/16/2021, irinotecan held CT chest 09/02/2021-enlargement of several lung nodules, no new nodules, stable postradiation changes in the right lower lobe and right middle lobe, gastroesophageal varices identified Cycle one 5-FU/Panitumumab 11/01/2021 Cycle two 5-FU/panitumumab 11/29/2021-Solu-Medrol and Pepcid prophylaxis added after she developed a cough during the panitumumab with cycle 1 Cycle 3 5-FU/panitumumab 12/12/2021 Cycle four 5-FU/panitumumab 12/27/2021 CT chest 01/25/2022-mild progression of metastatic disease to the lungs with increased size but stable number of metastatic lesions. Cycle 1 Lonsurf 02/27/2022 Cycle 2 Lonsurf 04/10/2022, placed on hold 04/13/2022 due to LFT abnormalities CT abdomen/pelvis 04/13/2022-persistent/chronic intrahepatic biliary dilatation most notable left hepatic lobe; vague area of low-attenuation near the porta hepatis, MRI recommended. MRI liver 04/14/2022-hypoenhancing 2.7 x 1.9 cm central liver mass in the region of the biliary hilum.  Associated high-grade biliary stricture at the biliary hilum affecting the central right and left intrahepatic bile ducts.       2.    Hypothyroid   3.    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  Port-A-Cath placement 09/29/2019, interventional radiology   5.  Neutropenia and thrombocytopenia following cycle 1 FOLFOX-plan chemotherapy dose reductions, she declined G-CSF   6.  Mucositis following cycle 5 FOLFOX, 5-fluorouracil dose reduced with cycle 6 Mucositis following cycle 22 5-fluorouracil-Magic mouthwash added   7.  Right lung airspace disease/volume loss-likely toxicity from chest radiation, trial of prednisone 01/29/2020;  dyspnea and cough improved 02/11/2020 Progressive cough 03/11/2020-prednisone resumed at a dose of 20 mg daily Cough and dyspnea improved 03/25/2020-prednisone taper to 15 mg daily Improved 04/06/2020-prednisone taper to 10 mg daily Stable 04/21/2020-prednisone taper to 5 mg daily   8.  Gastroesophageal varices     Disposition: Ms. Morosky appears stable.  She has metastatic rectal cancer most recently treated with Lonsurf.  She was seen for routine follow-up on 04/13/2022 at which time LFTs returned markedly elevated.  She was referred for CTs, then MRI.  We reviewed the recent MRI report/images with her and her daughter at today's visit.  They understand there is a mass at the biliary hilum likely representing metastatic disease causing bile duct obstruction.  We have recommended hospitalization for further evaluation/intervention to relieve the obstruction.  Ms. Dahlen agrees with this plan.  We have requested a bed at [facility].  Patient seen with Dr. Benay Spice.  Ned Card ANP/GNP-BC   04/17/2022  9:23 AM  This was a shared visit with Ned Card.  We reviewed the MRI findings and images with Ms. Kertis and her daughter.  She has a central liver lesion with obstruction of the intrahepatic bile ducts.  This is likely a metastasis from rectal cancer.  The bilirubin remains markedly elevated.  I have communicated with gastroenterology over the  past several days.  They recommend hospital admission for an expedited ERCP.  I contacted the hospitalist service.  She will be admitted to [facility] today.  I was present for greater than 50% of today's visit.  I performed medical decision making.  Samantha Manson, MD

## 2022-04-17 NOTE — Patient Instructions (Signed)

## 2022-04-17 NOTE — H&P (Addendum)
History and Physical  Samantha Lutz [id number] DOB: 01/05/1951 DOA: 04/17/2022  PCP: Andre Lefort, FNP   Chief Complaint: Rising LFTs   HPI: Samantha Lutz is a 72 y.o. female with medical history significant for rectal cancer who is labs have been followed closely since she is on chemotherapy.  During her last visit in the middle of March, Lonsurf was placed on hold due to rising LFTs.  She had MRI of the liver on 04/14/2022 which unfortunately showed a hypoenhancing 2.7 x 1.9 cm central liver mass compatible with solitary liver metastasis.  There is associated high-grade biliary hilar stricture.  She was started on ciprofloxacin, but she did not start taking it due to concerns of side effects.  Today she has been in her oncology office, and they have arranged direct admission to [facility].  Gastroenterology is aware of her admission, and plans ERCP in the morning.  Review of Systems: Please see HPI for pertinent positives and negatives. A complete 10 system review of systems are otherwise negative.  Past Medical History:  Diagnosis Date   Anemia    remote hx.   Arthritis    Bi lat knee   Cancer ([place]) 07/2017   Rectal Cancer    Fatty liver    ultrasound December 2012   Hypothyroid 01/16/2013 dx   Irregular heartbeat    PER PATIENT REPORT; ONSET 20+YEARS AGO SAW CARDIOLOGY AT THE TIME C/O "ITS BEAT REALLY FAST FOR A MINUTE AND THEN STOPPED " DENIES ANY OTHER CARDIAC SX';  REPORTS CARDIO DID ECHO WHICH WAS NEGATIVE;  NOW COMES AND GOES ;    Lichen sclerosus    steroids as needed   Temporary low platelet count ([place])    SEE LAST LABS IN EPIC   Past Surgical History:  Procedure Laterality Date   [place]  08/09/2017   danis - polyps   ILEOSTOMY CLOSURE N/A 07/10/2018   Procedure: ILEOSTOMY LOOP TAKEDOWN WITH TAP BLOCK;  Surgeon: [name], MD;  Location: [facility];  Service: General;  Laterality: N/A;   IR  IMAGING GUIDED PORT INSERTION  09/29/2019   OSTOMY N/A 12/21/2017   Procedure: DIVERTING LOOP OSTOMY;  Surgeon: [name], MD;  Location: [facility];  Service: General;  Laterality: N/A;   PROCTOSCOPY N/A 12/21/2017   Procedure: RIGID PROCTOSCOPY;  Surgeon: [name], MD;  Location: [facility];  Service: General;  Laterality: N/A;   XI ROBOTIC ASSISTED LOWER ANTERIOR RESECTION N/A 12/21/2017   Procedure: XI ROBOTIC ASSISTED LOWER ANTERIOR RESECTION;  Surgeon: [name], MD;  Location: [facility];  Service: General;  Laterality: N/A;    Social History:  reports that she has never smoked. She has never used smokeless tobacco. She reports that she does not drink alcohol and does not use drugs.   Allergies  Allergen Reactions   Epinephrine Palpitations   Losartan Hives   Vectibix [Panitumumab] Other (See Comments) and Cough    Non-productive coughing with chest tightness.   Patient had hypersensitivity reaction to Vectibix. See progress note from 11/01/2021 at 4:33 PM. Patient able to complete infusion.     Family History  Problem Relation Age of Onset   Diabetes Mother    Hypertension Mother    COPD Mother    Glaucoma Father 17   Stroke Maternal Grandmother 2   Hypertension Maternal Grandmother    Rheum arthritis Sister 6   Hypertension Daughter  Breast cancer Cousin    Colon cancer Neg Hx    Esophageal cancer Neg Hx    Liver cancer Neg Hx    Pancreatic cancer Neg Hx    Stomach cancer Neg Hx    Rectal cancer Neg Hx      Prior to Admission medications   Medication Sig Start Date End Date Taking? Authorizing Provider  albuterol (PROVENTIL HFA) 108 (90 Base) MCG/ACT inhaler Inhale 2 puffs into the lungs every 6 (six) hours as needed for wheezing or shortness of breath. 08/03/20   Owens Shark, NP  ciprofloxacin (CIPRO) 250 MG tablet Take 1 tablet (250 mg total) by mouth 2 (two) times daily for 7 days. 04/14/22 04/21/22  Owens Shark, NP  levothyroxine (SYNTHROID) 50 MCG  tablet Take 1 tablet by mouth once daily 02/06/22   Ladell Pier, MD  lidocaine-prilocaine (EMLA) cream Apply 1 Application topically as needed. Apply to port site 1-2 hours prior to use 12/12/21   Owens Shark, NP  magic mouthwash SOLN Take 5 mLs by mouth 4 (four) times daily as needed for mouth pain. 5 ml swish and spit 4 times a daily as needed for mouth pain Patient not taking: Reported on 11/29/2021 09/14/20   Ladell Pier, MD  Magnesium Hydroxide (DULCOLAX PO) Take 1 capsule by mouth as needed.    [provider]  magnesium oxide (MAG-OX) 400 (240 Mg) MG tablet Take 1 tablet (400 mg total) by mouth 2 (two) times daily. 02/22/22   Ladell Pier, MD  nystatin (MYCOSTATIN/NYSTOP) powder Apply 1 Application topically 3 (three) times daily. Patient not taking: Reported on 01/26/2022 12/12/21   Owens Shark, NP  potassium chloride SA (KLOR-CON M) 20 MEQ tablet Take 20 mEq by mouth daily.    [provider]  predniSONE (DELTASONE) 5 MG tablet Take 1 tablet by mouth once daily with breakfast 03/15/22   Ladell Pier, MD  prochlorperazine (COMPAZINE) 10 MG tablet Take 1 tablet (10 mg total) by mouth every 6 (six) hours as needed for nausea or vomiting. Patient not taking: Reported on 02/22/2022 03/01/21   Owens Shark, NP  Spacer/Aero-Holding Chambers (AEROCHAMBER MV) inhaler Use as instructed 12/31/19   Ladell Pier, MD  trifluridine-tipiracil (LONSURF) 20-8.19 MG tablet Take 2 tablets (40 mg Trifluridine) twice daily, take within 1 hr after AM & PM meals on days 1-5, 8-12. Repeat every 28 days. Start cycle on 04/10/22 Patient not taking: Reported on 04/17/2022 04/04/22   Ladell Pier, MD    Physical Exam: BP (!) 141/80 (BP Location: Left Arm)   Pulse 83   Temp 98.4 F (36.9 C) (Oral)   Resp 16   Ht 5\' 3"  (1.6 m)   Wt 91.3 kg   LMP  (LMP Unknown)   SpO2 99%   BMI 35.66 kg/m   General:  Alert, oriented, calm, in no acute distress, jaundice Eyes: EOMI,  clear conjuctivae, white sclerea Neck: supple, no masses, trachea mildline  Cardiovascular: RRR, no murmurs or rubs, no peripheral edema  Respiratory: clear to auscultation bilaterally, no wheezes, no crackles  Abdomen: soft, nontender, nondistended, normal bowel tones heard  Skin: dry, no rashes  Musculoskeletal: no joint effusions, normal range of motion  Psychiatric: appropriate affect, normal speech  Neurologic: extraocular muscles intact, clear speech, moving all extremities with intact sensorium          Labs on Admission:  Basic Metabolic Panel: Recent Labs  Lab 04/13/22 1052 04/17/22 0841  NA 140 141  K 3.3* 3.7  CL 109 108  CO2 23 24  GLUCOSE 129* 101*  BUN 11 13  CREATININE 0.77 0.81  CALCIUM 8.6* 9.2  MG 1.9  --    Liver Function Tests: Recent Labs  Lab 04/13/22 1052 04/17/22 0841  AST 135* 134*  ALT 92* 103*  ALKPHOS 161* 215*  BILITOT 6.7* 8.0*  PROT 6.0* 6.4*  ALBUMIN 3.2* 3.5   No results for input(s): "LIPASE", "AMYLASE" in the last 168 hours. No results for input(s): "AMMONIA" in the last 168 hours. CBC: Recent Labs  Lab 04/13/22 1052 04/17/22 1645  WBC 3.9* 5.0  NEUTROABS 2.9 3.6  HGB 10.7* 10.7*  HCT 31.6* 32.7*  MCV 100.0 102.5*  PLT 109* 111*   Cardiac Enzymes: No results for input(s): "CKTOTAL", "CKMB", "CKMBINDEX", "TROPONINI" in the last 168 hours.  BNP (last 3 results) No results for input(s): "BNP" in the last 8760 hours.  ProBNP (last 3 results) No results for input(s): "PROBNP" in the last 8760 hours.  CBG: No results for input(s): "GLUCAP" in the last 168 hours.  Radiological Exams on Admission: No results found.  Assessment/Plan Principal Problem:   Biliary obstruction -unfortunately this to pathology ligament from her known rectal cancer,, she is not suffering any other sequelae of her obstruction other than rising LFTs at this time and very intermittent abdominal discomfort. -Inpatient admission -Pain and nausea  control as needed -Will hydrate gently with some IV fluids -Regular diet this evening, n.p.o. after midnight for ERCP with GI in the morning Active Problems:   Hypertension   Hypothyroid   Rectal cancer [id number] (1/23 LN) s/p neoadj chemoXRT, robotic LAR resection & diverting loop ileostomy 12/21/2017  DVT prophylaxis: Lovenox   Code Status: Full  Consults called: None  Admission status: Inpatient   The appropriate patient status for this patient is INPATIENT. Inpatient status is judged to be reasonable and necessary in order to provide the required intensity of service to ensure the patient's safety. The patient's presenting symptoms, physical exam findings, and initial radiographic and laboratory data in the context of their chronic comorbidities is felt to place them at high risk for further clinical deterioration. Furthermore, it is not anticipated that the patient will be medically stable for discharge from the hospital within 2 midnights of admission.    I certify that at the point of admission it is my clinical judgment that the patient will require inpatient hospital care spanning beyond 2 midnights from the point of admission due to high intensity of service, high risk for further deterioration and high frequency of surveillance required   Time spent: 35 minutes  Rusty Villella Neva Seat MD [facility] Pager [phone]  If 7PM-7AM, please contact night-coverage [email or web address] Password [facility]  04/17/2022, 5:19 PM

## 2022-04-17 NOTE — Progress Notes (Signed)
Patient ID: Samantha Lutz, female   DOB: 1950-03-27, 72 y.o.   MRN: [id number]    Brief GI note;   Aware of admission  with obstructive jaundice . We will see in consult in am   Regular diet this evening and NPO in am   Pt has been scheduled for  ERCP / stent placement with Dr. Carlean Purl at 1 pm tomorrow

## 2022-04-17 NOTE — Progress Notes (Signed)
CRITICAL VALUE STICKER  CRITICAL VALUE: T. Bili =8.0  RECEIVER (on-site recipient of call):Isayah Ignasiak,RN  DATE & TIME NOTIFIED: 04/17/22 @ 0956  MESSENGER (representative from lab):Simona Huh  MD NOTIFIED: Ned Card, NP  TIME OF NOTIFICATION:10:00  RESPONSE: Admission to hospital

## 2022-04-18 ENCOUNTER — Inpatient Hospital Stay (HOSPITAL_COMMUNITY): Payer: Medicare Other

## 2022-04-18 ENCOUNTER — Inpatient Hospital Stay (HOSPITAL_COMMUNITY): Payer: Medicare Other | Admitting: Anesthesiology

## 2022-04-18 ENCOUNTER — Encounter (HOSPITAL_COMMUNITY): Payer: Self-pay | Admitting: Internal Medicine

## 2022-04-18 ENCOUNTER — Encounter (HOSPITAL_COMMUNITY)
Admission: AD | Disposition: A | Discharge status: Home / Self Care | Payer: Self-pay | Source: Ambulatory Visit | Attending: Internal Medicine

## 2022-04-18 DIAGNOSIS — I10 Essential (primary) hypertension: Secondary | ICD-10-CM | POA: Diagnosis not present

## 2022-04-18 DIAGNOSIS — K831 Obstruction of bile duct: Secondary | ICD-10-CM | POA: Diagnosis not present

## 2022-04-18 DIAGNOSIS — C20 Malignant neoplasm of rectum: Secondary | ICD-10-CM | POA: Diagnosis not present

## 2022-04-18 DIAGNOSIS — E039 Hypothyroidism, unspecified: Secondary | ICD-10-CM

## 2022-04-18 DIAGNOSIS — D638 Anemia in other chronic diseases classified elsewhere: Secondary | ICD-10-CM

## 2022-04-18 DIAGNOSIS — C787 Secondary malignant neoplasm of liver and intrahepatic bile duct: Secondary | ICD-10-CM

## 2022-04-18 HISTORY — PX: BILIARY STENT PLACEMENT: SHX5538

## 2022-04-18 HISTORY — PX: ERCP: SHX5425

## 2022-04-18 LAB — COMPREHENSIVE METABOLIC PANEL
ALT: 93 U/L — ABNORMAL HIGH (ref 0–44)
AST: 140 U/L — ABNORMAL HIGH (ref 15–41)
Albumin: 2.7 g/dL — ABNORMAL LOW (ref 3.5–5.0)
Alkaline Phosphatase: 168 U/L — ABNORMAL HIGH (ref 38–126)
Anion gap: 8 (ref 5–15)
BUN: 12 mg/dL (ref 8–23)
CO2: 23 mmol/L (ref 22–32)
Calcium: 7.9 mg/dL — ABNORMAL LOW (ref 8.9–10.3)
Chloride: 108 mmol/L (ref 98–111)
Creatinine, Ser: 0.68 mg/dL (ref 0.44–1.00)
GFR, Estimated: >60 mL/min (ref >60)
Glucose, Bld: 111 mg/dL — ABNORMAL HIGH (ref 70–99)
Potassium: 3.5 mmol/L (ref 3.5–5.1)
Sodium: 139 mmol/L (ref 135–145)
Total Bilirubin: 7.8 mg/dL — ABNORMAL HIGH (ref 0.3–1.2)
Total Protein: 5.5 g/dL — ABNORMAL LOW (ref 6.5–8.1)

## 2022-04-18 LAB — CBC
HCT: 28.6 % — ABNORMAL LOW (ref 36.0–46.0)
Hemoglobin: 9.3 g/dL — ABNORMAL LOW (ref 12.0–15.0)
MCH: 33.5 pg (ref 26.0–34.0)
MCHC: 32.5 g/dL (ref 30.0–36.0)
MCV: 102.9 fL — ABNORMAL HIGH (ref 80.0–100.0)
Platelets: 92 10*3/uL — ABNORMAL LOW (ref 150–400)
RBC: 2.78 MIL/uL — ABNORMAL LOW (ref 3.87–5.11)
RDW: 18.2 % — ABNORMAL HIGH (ref 11.5–15.5)
WBC: 2.4 10*3/uL — ABNORMAL LOW (ref 4.0–10.5)
nRBC: 0 % (ref 0.0–0.2)

## 2022-04-18 SURGERY — ENDOSCOPIC RETROGRADE CHOLANGIOPANCREATOGRAPHY (ERCP)
Anesthesia: General

## 2022-04-18 MED ORDER — GLUCAGON HCL RDNA (DIAGNOSTIC) 1 MG IJ SOLR
INTRAMUSCULAR | Status: AC
  Filled 2022-04-18: qty 1

## 2022-04-18 MED ORDER — ROCURONIUM BROMIDE 100 MG/10ML IV SOLN
INTRAVENOUS | Status: DC | PRN
  Administered 2022-04-18: 50 mg via INTRAVENOUS

## 2022-04-18 MED ORDER — CHLORHEXIDINE GLUCONATE CLOTH 2 % EX PADS
6.0000 | MEDICATED_PAD | Freq: Every day | CUTANEOUS | Status: DC
  Administered 2022-04-18 – 2022-05-04 (×16): 6 via TOPICAL

## 2022-04-18 MED ORDER — SODIUM CHLORIDE 0.9 % IV SOLN: 1.5000 g | Freq: Once | INTRAVENOUS | Status: DC

## 2022-04-18 MED ORDER — DICLOFENAC SUPPOSITORY 100 MG: 100.0000 mg | Freq: Once | RECTAL | Status: DC

## 2022-04-18 MED ORDER — CIPROFLOXACIN IN D5W 400 MG/200ML IV SOLN
INTRAVENOUS | Status: AC
  Filled 2022-04-18: qty 200

## 2022-04-18 MED ORDER — POTASSIUM CHLORIDE 2 MEQ/ML IV SOLN
INTRAVENOUS | Status: DC
  Filled 2022-04-18 (×4): qty 1000

## 2022-04-18 MED ORDER — PHENYLEPHRINE HCL (PRESSORS) 10 MG/ML IV SOLN
INTRAVENOUS | Status: DC | PRN
  Administered 2022-04-18: 80 ug via INTRAVENOUS

## 2022-04-18 MED ORDER — ENOXAPARIN SODIUM 60 MG/0.6ML IJ SOSY
50.0000 mg | PREFILLED_SYRINGE | INTRAMUSCULAR | Status: DC
  Administered 2022-04-18 – 2022-04-19 (×2): 50 mg via SUBCUTANEOUS
  Filled 2022-04-18 (×2): qty 0.6

## 2022-04-18 MED ORDER — SODIUM CHLORIDE 0.9 % IV SOLN
1.5000 g | Freq: Four times a day (QID) | INTRAVENOUS | Status: DC
  Administered 2022-04-18 – 2022-04-25 (×28): 1.5 g via INTRAVENOUS
  Filled 2022-04-18 (×29): qty 4

## 2022-04-18 MED ORDER — PROPOFOL 10 MG/ML IV BOLUS
INTRAVENOUS | Status: DC | PRN
  Administered 2022-04-18: 50 mg via INTRAVENOUS
  Administered 2022-04-18: 150 mg via INTRAVENOUS

## 2022-04-18 MED ORDER — LIDOCAINE HCL (CARDIAC) PF 100 MG/5ML IV SOSY
PREFILLED_SYRINGE | INTRAVENOUS | Status: DC | PRN
  Administered 2022-04-18: 60 mg via INTRAVENOUS

## 2022-04-18 MED ORDER — FENTANYL CITRATE (PF) 100 MCG/2ML IJ SOLN
INTRAMUSCULAR | Status: AC
  Filled 2022-04-18: qty 2

## 2022-04-18 MED ORDER — SUGAMMADEX SODIUM 200 MG/2ML IV SOLN
INTRAVENOUS | Status: DC | PRN
  Administered 2022-04-18: 200 mg via INTRAVENOUS

## 2022-04-18 MED ORDER — DICLOFENAC SUPPOSITORY 100 MG
RECTAL | Status: AC
  Filled 2022-04-18: qty 1

## 2022-04-18 MED ORDER — DEXAMETHASONE SODIUM PHOSPHATE 4 MG/ML IJ SOLN
INTRAMUSCULAR | Status: DC | PRN
  Administered 2022-04-18: 4 mg via INTRAVENOUS

## 2022-04-18 MED ORDER — ENOXAPARIN SODIUM 60 MG/0.6ML IJ SOSY: 50.0000 mg | PREFILLED_SYRINGE | INTRAMUSCULAR | Status: DC

## 2022-04-18 MED ORDER — POTASSIUM CHLORIDE 2 MEQ/ML IV SOLN: INTRAVENOUS | Status: DC

## 2022-04-18 MED ORDER — SODIUM CHLORIDE 0.9 % IV SOLN
INTRAVENOUS | Status: DC | PRN
  Administered 2022-04-18: 25 mL

## 2022-04-18 MED ORDER — DICLOFENAC SUPPOSITORY 100 MG
RECTAL | Status: DC | PRN
  Administered 2022-04-18: 100 mg via RECTAL

## 2022-04-18 MED ORDER — POTASSIUM CHLORIDE 2 MEQ/ML IV SOLN
Freq: Once | INTRAVENOUS | Status: AC
  Filled 2022-04-18: qty 1000

## 2022-04-18 MED ORDER — FENTANYL CITRATE (PF) 100 MCG/2ML IJ SOLN
INTRAMUSCULAR | Status: DC | PRN
  Administered 2022-04-18 (×2): 50 ug via INTRAVENOUS

## 2022-04-18 MED ORDER — LACTATED RINGERS IV SOLN: INTRAVENOUS | Status: DC | PRN

## 2022-04-18 NOTE — Assessment & Plan Note (Signed)
Continue Synthroid °

## 2022-04-18 NOTE — Assessment & Plan Note (Addendum)
-   Underwent MRI liver on 04/14/2022 showing high-grade malignant biliary hilar stricture - s/p ERCP 3/19 with localized biliary strictures found in the hepatitic duct system from extrahepatic mestastasis.  Strictures malignant appearing.  Treated with stent placement.  - s/p unasyn course on admission   - removal of biliary stent 3/22 - s/p external biliary drain in right hepatic bile duct 3/22 - CT abd with contrast 3/26 with R sided perc biliary drain with formed pigtail in central right hepatic ducts.  Diminished R sided biliary ductal dilatation.  Similar appearance of L sided biliary ductal dilatation.  Ill defined hypodensity in central liver.  - GI now signed off - s/p fluoroscopy guided right sided approach biliary drainage catheter placed in IR on 04/26/2022; removal of initial biliary drain -Continue flushing drain 3 times daily  - TB has been uptrending along with remainder LFTs suggesting obstruction still. Radiology still following; potential need for repeat cholangiogram if labs continue to worsen  - WBC now uptrending on 3/31 and biliary fluid culture noted with Enterobacter; started on cefepime on 3/31; likely treat 5-7 days?  - persistent lower abd pain and persistent elevation of liver enzymes; repeat CT 4/1 shows cholelithiasis with wall thickening and edema; new small subcapsular fluid collection superior to entrance site of PTC cath - lipase normal; case discussed with GI and surgery - no additional GI rec's, appreciate re-eval; signed off 4/2 - surgery rec's no CCY but if ongoing pain, then possibly a perc drain; IR also following - for now, seems LFTs finally starting to downtrend; continue monitoring CMP and clinically for acute chole

## 2022-04-18 NOTE — Consult Note (Addendum)
Consultation  Referring Provider: Ladell Pier, MD Primary Care Physician:  Andre Lefort, FNP Primary Gastroenterologist:  Dr.Danis  Reason for Consultation:   obstructive jaundice/ metastatic rectal cancer  HPI: Samantha Lutz is a 72 y.o. female, established with Dr. Loletha Carrow, who was admitted to the hospital yesterday after outpatient labs revealed obstructive jaundice.  Patient has been under the care of Dr. Benay Spice since 2019 with metastatic rectal cancer.  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  There is evidence of lymphovascular invasion.  Since that time she has undergone adjuvant chemotherapy.  She had ileostomy takedown in June 2020.  Was found to have metastatic lung nodules in 2021 and has been on chemotherapy since.  Most recently on Lonsurf was initiated in January 2024.  At that time had shown mild progression of her lung disease. Noted earlier this month to have mild elevation of LFTs and then underwent outpatient CT and then MRI on 04/14/2022 which did show a hypoattenuating liver mass measuring 7 x 1.9 cm in the region of the biliary hilum new from CT of August 2022 but in the same location as a hypermetabolic liver mass initially seen in August 2021.  This is felt consistent with metastatic disease.  There is evidence of high-grade stricture.  She was started on Cipro as an outpatient on 315 but says she did not take that.  Labs were repeated and she was seen by oncology yesterday and sent for admission last p.m. She says she has been feeling okay, last took her oral chemo on 04/11/2022. Says she has noticed some abdominal pain intermittently over the past month located in the right upper abdomen and sometimes radiating across the upper abdomen this has not been severe and has been intermittent.  No associated nausea or vomiting  has been able to eat.  She has not had any documented fever chills or sweats.  She did notice that her urine became dark and that has been progressive over the past week and she says her granddaughter noticed that her eyes looked a bit yellow about 5 days ago.  Labs today T. bili 7.8/alk phos 68/AST 140/ALT 93 Albumin 2.7 BUN 12/creatinine 0.68 WBC 2.4/hemoglobin 9.3/hematocrit 28.6/MCV 102/platelets 92 INR 1.1  She is not on any anticoagulation, no antibiotics as yet.   Past Medical History:  Diagnosis Date   Anemia    remote hx.   Arthritis    Bi lat knee   Cancer ([place]) 07/2017   Rectal Cancer    Fatty liver    ultrasound December 2012   Hypothyroid 01/16/2013 dx   Irregular heartbeat    PER PATIENT REPORT; ONSET 20+YEARS AGO SAW CARDIOLOGY AT THE TIME C/O "ITS BEAT REALLY FAST FOR A MINUTE AND THEN STOPPED " DENIES ANY OTHER CARDIAC SX';  REPORTS CARDIO DID ECHO WHICH WAS NEGATIVE;  NOW COMES AND GOES ;    Lichen sclerosus    steroids as needed   Temporary low platelet count ([place])    SEE LAST LABS IN EPIC    Past Surgical History:  Procedure Laterality Date   [place]  08/09/2017   danis - polyps   ILEOSTOMY CLOSURE N/A 07/10/2018   Procedure: ILEOSTOMY LOOP TAKEDOWN WITH TAP BLOCK;  Surgeon: [name], MD;  Location: [facility];  Service: General;  Laterality: N/A;   IR IMAGING GUIDED PORT INSERTION  09/29/2019   OSTOMY N/A 12/21/2017   Procedure: DIVERTING LOOP OSTOMY;  Surgeon: [name], MD;  Location: [facility];  Service: General;  Laterality: N/A;   PROCTOSCOPY N/A 12/21/2017   Procedure: RIGID PROCTOSCOPY;  Surgeon: [name], MD;  Location: [facility];  Service: General;  Laterality: N/A;   XI ROBOTIC ASSISTED LOWER ANTERIOR RESECTION N/A 12/21/2017   Procedure: XI ROBOTIC ASSISTED LOWER ANTERIOR RESECTION;  Surgeon: [name], MD;  Location: [facility];  Service: General;  Laterality: N/A;    Prior to  Admission medications   Medication Sig Start Date End Date Taking? Authorizing Provider  albuterol (PROVENTIL HFA) 108 (90 Base) MCG/ACT inhaler Inhale 2 puffs into the lungs every 6 (six) hours as needed for wheezing or shortness of breath. 08/03/20  Yes Owens Shark, NP  bisacodyl (DULCOLAX) 5 MG EC tablet Take 5 mg by mouth daily as needed for mild constipation or moderate constipation.   Yes [provider]  levothyroxine (SYNTHROID) 50 MCG tablet Take 1 tablet by mouth once daily Patient taking differently: Take 50 mcg by mouth daily before breakfast. 02/06/22  Yes Ladell Pier, MD  lidocaine-prilocaine (EMLA) cream Apply 1 Application topically as needed. Apply to port site 1-2 hours prior to use Patient taking differently: Apply 1 Application topically as needed (for port access- one to two hours prior to accessing). 12/12/21  Yes Owens Shark, NP  magic mouthwash SOLN Take 5 mLs by mouth 4 (four) times daily as needed for mouth pain. 5 ml swish and spit 4 times a daily as needed for mouth pain 09/14/20  Yes Ladell Pier, MD  magnesium oxide (MAG-OX) 400 (240 Mg) MG tablet Take 1 tablet (400 mg total) by mouth 2 (two) times daily. 02/22/22  Yes Ladell Pier, MD  Nutritional Supplements (ENSURE ORIGINAL) LIQD Take 237 mLs by mouth See admin instructions. Drink 237 ml's (1 CHOCOLATE shake) by mouth once a day   Yes [provider]  potassium chloride (MICRO-K) 10 MEQ CR capsule Take 10 mEq by mouth daily.   Yes [provider]  predniSONE (DELTASONE) 5 MG tablet Take 1 tablet by mouth once daily with breakfast 03/15/22  Yes Ladell Pier, MD  prochlorperazine (COMPAZINE) 10 MG tablet Take 1 tablet (10 mg total) by mouth every 6 (six) hours as needed for nausea or vomiting. 03/01/21  Yes Owens Shark, NP  SYSTANE ULTRA PF 0.4-0.3 % SOLN Place 1 drop into both eyes 3 (three) times daily as needed (for dryness).   Yes [provider]  ciprofloxacin  (CIPRO) 250 MG tablet Take 1 tablet (250 mg total) by mouth 2 (two) times daily for 7 days. Patient not taking: Reported on 04/17/2022 04/14/22 04/21/22  Owens Shark, NP  nystatin (MYCOSTATIN/NYSTOP) powder Apply 1 Application topically 3 (three) times daily. Patient not taking: Reported on 04/17/2022 12/12/21   Owens Shark, NP  Spacer/Aero-Holding Chambers (AEROCHAMBER MV) inhaler Use as instructed 12/31/19   Ladell Pier, MD  trifluridine-tipiracil (LONSURF) 20-8.19 MG tablet Take 2 tablets (40 mg Trifluridine) twice daily, take within 1 hr after AM & PM meals on days 1-5, 8-12. Repeat every 28 days. Start cycle on 04/10/22 Patient not taking: Reported on 04/17/2022 04/04/22   Ladell Pier, MD    Current Facility-Administered Medications  Medication Dose Route Frequency Provider Last Rate Last Admin   0.9 %  NaCl with KCl 20 mEq/ L  infusion   Intravenous Continuous Hollice Gong, Mir M, MD 50 mL/hr at 04/18/22 0321 Infusion Verify at 04/18/22 0321   acetaminophen (TYLENOL) tablet 650 mg  650 mg Oral Q6H PRN Hollice Gong, Mir M, MD       Or   acetaminophen (TYLENOL) suppository 650 mg  650 mg Rectal Q6H PRN Hollice Gong, Mir M, MD       Chlorhexidine Gluconate Cloth 2 % PADS 6 each  6 each Topical Daily Dwyane Dee, MD       enoxaparin (LOVENOX) injection 40 mg  40 mg Subcutaneous Q24H Hollice Gong, Mir M, MD   40 mg at 04/17/22 2207   levothyroxine (SYNTHROID) tablet 50 mcg  50 mcg Oral Daily Hollice Gong, Mir M, MD   50 mcg at 04/18/22 0520   ondansetron (ZOFRAN) tablet 4 mg  4 mg Oral Q6H PRN Hollice Gong, Mir M, MD       Or   ondansetron [facility]) injection 4 mg  4 mg Intravenous Q6H PRN Hollice Gong, Mir M, MD       oxyCODONE (Oxy IR/ROXICODONE) immediate release tablet 5 mg  5 mg Oral Q4H PRN Hollice Gong, Mir M, MD       predniSONE (DELTASONE) tablet 5 mg  5 mg Oral Q breakfast Hollice Gong, Mir M, MD   5 mg at 04/18/22 [id number]   traZODone (DESYREL) tablet 25 mg  25 mg Oral QHS PRN Hollice Gong, Mir M,  MD       Facility-Administered Medications Ordered in Other Encounters  Medication Dose Route Frequency Provider Last Rate Last Admin   heparin lock flush 100 unit/mL  500 Units Intracatheter Once Betsy Coder B, MD       sodium chloride flush (NS) 0.9 % injection 10 mL  10 mL Intravenous PRN Owens Shark, NP   10 mL at 05/04/21 0958   sodium chloride flush (NS) 0.9 % injection 10 mL  10 mL Intracatheter Once Ladell Pier, MD        Allergies as of 04/17/2022 - Review Complete 04/17/2022  Allergen Reaction Noted   Epinephrine Palpitations 07/26/2017   Losartan Hives 01/11/2012   Latex Itching and Other (See Comments) 04/17/2022   Vectibix [panitumumab] Other (See Comments) and Cough 11/01/2021    Family History  Problem Relation Age of Onset   Diabetes Mother    Hypertension Mother    COPD Mother    Glaucoma Father 64   Stroke Maternal Grandmother 30   Hypertension Maternal Grandmother    Rheum arthritis Sister 7   Hypertension Daughter    Breast cancer Cousin    Colon cancer Neg Hx    Esophageal cancer Neg Hx    Liver cancer Neg Hx    Pancreatic cancer Neg Hx    Stomach cancer Neg Hx    Rectal cancer Neg Hx     Social History   Socioeconomic History   Marital status: Widowed    Spouse name: Not on file   Number of children: Not on file   Years of education: Not on file   Highest education level: Not on file  Occupational History   Not on file  Tobacco Use   Smoking status: Never   Smokeless tobacco: Never  Vaping Use   Vaping Use: Never used  Substance and Sexual Activity   Alcohol use: No   Drug use: No   Sexual activity: Yes    Birth control/protection: Post-menopausal  Other Topics Concern   Not on file  Social History Narrative   Not on file   Social Determinants of Health   Financial Resource Strain: Low Risk  (11/01/2021)   Overall Financial Resource Strain (CARDIA)    Difficulty of Paying Living Expenses: Not hard at all  Food  Insecurity: No Food Insecurity (11/01/2021)   Hunger Vital Sign    Worried About Running Out of Food in the Last Year: Never true    Ran Out of Food in the Last Year: Never true  Transportation Needs: No Transportation Needs (11/01/2021)   PRAPARE - Hydrologist (Medical): No    Lack of Transportation (Non-Medical): No  Physical Activity: Not on file  Stress: Not on file  Social Connections: Socially Isolated (11/01/2021)   Social Connection and Isolation Panel [NHANES]    Frequency of Communication with Friends and Family: More than three times a week    Frequency of Social Gatherings with Friends and Family: More than three times a week    Attends Religious Services: Never    Marine scientist or Organizations: No    Attends Archivist Meetings: Never    Marital Status: Widowed  Intimate Partner Violence: Not At Risk (11/01/2021)   Humiliation, Afraid, Rape, and Kick questionnaire    Fear of Current or Ex-Partner: No    Emotionally Abused: No    Physically Abused: No    Sexually Abused: No    Review of Systems: Pertinent positive and negative review of systems were noted in the above HPI section.  All other review of systems was otherwise negative. n.  Physical Exam: Vital signs in last 24 hours: Temp:  [97.9 F (36.6 C)-98.4 F (36.9 C)] 98.3 F (36.8 C) (03/19 0457) Pulse Rate:  [76-89] 77 (03/19 0457) Resp:  [16-18] 18 (03/19 0457) BP: (129-150)/(61-87) 142/64 (03/19 0457) SpO2:  [97 %-100 %] 98 % (03/19 0457) Weight:  [91.3 kg-91.4 kg] 91.3 kg (03/18 1702) Last BM Date : 04/17/22 General:   Alert,  Well-developed, well-nourished, older white female pleasant and cooperative in NAD Head:  Normocephalic and atraumatic. Eyes:  Sclera icteric.   Conjunctiva pink. Ears:  Normal auditory acuity. Nose:  No deformity, discharge,  or lesions. Mouth:  No deformity or lesions.   Neck:  Supple; no masses or thyromegaly. Lungs:  Clear  throughout to auscultation.   No wheezes, crackles, or rhonchi.  Heart:  Regular rate and rhythm; no murmurs, clicks, rubs,  or gallops. Abdomen:  Soft,, obese, there are some mild tenderness in the right upper quadrant no guarding or rebound no palpable mass or hepatosplenomegaly, midline incisional scar from ileostomy Rectal; not done today Msk:  Symmetrical without gross deformities. . Pulses:  Normal pulses noted. Extremities:  Without clubbing or edema. Neurologic:  Alert and  oriented x4;  grossly normal neurologically. Skin:  Intact without significant lesions or rashes.. Psych:  Alert and cooperative. Normal mood and affect.  Intake/Output from previous day: 03/18 0701 - 03/19 0700 In: 552.1 [P.O.:240; I.V.:312.1] Out: -  Intake/Output this shift: No intake/output data recorded.  Lab Results: Recent Labs    04/17/22 1645 04/17/22 1934 04/18/22 0528  WBC 5.0 3.6* 2.4*  HGB 10.7* 9.9* 9.3*  HCT 32.7* 30.2* 28.6*  PLT 111* 109* 92*   BMET Recent Labs    04/17/22 0841 04/17/22 1934 04/18/22 0528  NA 141  --  139  K 3.7  --  3.5  CL 108  --  108  CO2 24  --  23  GLUCOSE 101*  --  111*  BUN 13  --  12  CREATININE 0.81 0.51 0.68  CALCIUM 9.2  --  7.9*   LFT Recent Labs    04/18/22 0528  PROT 5.5*  ALBUMIN 2.7*  AST 140*  ALT 93*  ALKPHOS 168*  BILITOT 7.8*   PT/INR Recent Labs    04/17/22 0841  LABPROT 13.9  INR 1.1     IMPRESSION:  #58 72 year old white female initially diagnosed with rectal cancer 2019, status post chemotherapy radiation then low anterior resection and ileostomy November 2019, T3Y PN 10 with lymphovascular invasion. She has been managed aggressively, did develop metastatic lung nodules in 2021 for which she has been on several courses of chemotherapy.  Most recently on Lonsurf initiated January 2024 for mild progression of lung disease Noted to have elevated LFTs last week, imaging with MRI of the liver shows what appears to be a  hypoattenuating central liver mass located at the hilum and associated with a high-grade stricture.  Admitted yesterday with progressive jaundice.  Has been having some mild intermittent right upper quadrant and upper abdominal pain, no nausea or vomiting, no fever chills or sweats. Last oral chemo 04/13/2022  #2 mild pancytopenia likely chemo related #3 Port-A-Cath in place #4.  Hypothyroidism  Plan; keep n.p.o. Patient has been scheduled for ERCP and stent placement with Dr. Carlean Purl for early this afternoon.  Procedure was discussed in detail with the patient this morning including indications risk and benefits.  We reviewed biliary images, and discussed potential complications of pancreatitis and procedure failure.  She is agreeable to proceed. Would plan to leave on IV Unasyn postprocedure given her mild leukopenia  Repeat labs in a.m. Hold Lovenox tonight GI will follow with you    Amy Esterwood PA-C 04/18/2022, 8:30 AM    [facility] Attending   I have taken an interval history, reviewed the chart and examined the patient. I agree with the Advanced Practitioner's note, impression and recommendations.   The patient needs ERCP and stent placement to alleviate obstructive jaundice. The risks and benefits as well as alternatives of endoscopic procedure(s) have been discussed and reviewed. All questions answered. The patient agrees to proceed.  Gatha Mayer, MD, [facility] See Shea Evans on call - gastroenterology for best contact person 04/18/2022 12:57 PM

## 2022-04-18 NOTE — Transfer of Care (Signed)
Immediate Anesthesia Transfer of Care Note  Patient: JORJA CHAU  Procedure(s) Performed: ENDOSCOPIC RETROGRADE CHOLANGIOPANCREATOGRAPHY (ERCP) BILIARY DILITATION BILIARY STENT PLACEMENT  Patient Location: PACU and Endoscopy Unit  Anesthesia Type:General  Level of Consciousness: awake, alert , oriented, and patient cooperative  Airway & Oxygen Therapy: Patient Spontanous Breathing and Patient connected to nasal cannula oxygen  Post-op Assessment: Report given to RN, Post -op Vital signs reviewed and stable, and Patient moving all extremities  Post vital signs: Reviewed and stable  Last Vitals:  Vitals Value Taken Time  BP 172/61 04/18/22 1501  Temp    Pulse 84 04/18/22 1502  Resp 12 04/18/22 1502  SpO2 100 % 04/18/22 1502  Vitals shown include unvalidated device data.  Last Pain:  Vitals:   04/18/22 1230  TempSrc: Temporal  PainSc: 0-No pain         Complications: No notable events documented.

## 2022-04-18 NOTE — Anesthesia Procedure Notes (Signed)
Procedure Name: Intubation Date/Time: 04/18/2022 1:38 PM  Performed by: Randye Lobo, CRNAPre-anesthesia Checklist: Patient identified, Emergency Drugs available, Suction available and Patient being monitored Patient Re-evaluated:Patient Re-evaluated prior to induction Oxygen Delivery Method: Circle System Utilized Preoxygenation: Pre-oxygenation with 100% oxygen Induction Type: IV induction Ventilation: Mask ventilation without difficulty Laryngoscope Size: Mac Grade View: Grade I Tube type: Oral Tube size: 7.0 mm Number of attempts: 1 Airway Equipment and Method: Stylet and Oral airway Placement Confirmation: ETT inserted through vocal cords under direct vision, positive ETCO2 and breath sounds checked- equal and bilateral Secured at: 22 cm Tube secured with: Tape Dental Injury: Teeth and Oropharynx as per pre-operative assessment

## 2022-04-18 NOTE — Assessment & Plan Note (Signed)
-   relatively controlled -No home meds noted on med rec

## 2022-04-18 NOTE — Assessment & Plan Note (Signed)
-   Follows closely outpatient with oncology; has been needed with Lonsurf; cycle 2 placed on hold recently due to hyperTB

## 2022-04-18 NOTE — Anesthesia Postprocedure Evaluation (Signed)
Anesthesia Post Note  Patient: Samantha Lutz  Procedure(s) Performed: ENDOSCOPIC RETROGRADE CHOLANGIOPANCREATOGRAPHY (ERCP) [place]     Patient location during evaluation: PACU Anesthesia Type: General Level of consciousness: awake and alert and oriented Pain management: pain level controlled Vital Signs Assessment: post-procedure vital signs reviewed and stable Respiratory status: spontaneous breathing, nonlabored ventilation and respiratory function stable Cardiovascular status: blood pressure returned to baseline and stable Postop Assessment: no apparent nausea or vomiting Anesthetic complications: no   No notable events documented.  Last Vitals:  Vitals:   04/18/22 1500 04/18/22 1510  BP: (!) 172/61 (!) 148/56  Pulse: 84 83  Resp: 13 11  Temp: 36.7 C   SpO2: 100% 98%    Last Pain:  Vitals:   04/18/22 1520  TempSrc:   PainSc: 0-No pain                 Eavan Gonterman A.

## 2022-04-18 NOTE — Hospital Course (Addendum)
Samantha Lutz is a 72 yo female with PMH rectal cancer with liver metastasis who presented with mild abdominal discomfort and outpatient elevated total bilirubin.  This has been trended and found to not be resolving.  MRI liver was obtained on 04/14/2022 which showed underlying hypoenhancing central liver mass measuring 2.7 x 1.9 cm compatible with solitary recurrent liver metastasis.  Also showed underlying associated high-grade malignant biliary hilar stricture.  She was admitted for GI evaluation and expedited endoscopy for further evaluation.  She underwent ERCP and stent placement 3/19. Hospitalization complicated by post ERCP pancreatitis and rising LFT's. GI removed biliary stent on 3/22 and IR was consulted for external biliary drain. She underwent fluoro guided right sided transhepatic biliary drainage catheter with IR on 04/26/2022.

## 2022-04-18 NOTE — Progress Notes (Signed)
Patient was rubbing eyes, pt instructed to no rub eyes and stopped, red marks noted under bilateral eyes

## 2022-04-18 NOTE — Anesthesia Preprocedure Evaluation (Addendum)
Anesthesia Evaluation  Patient identified by MRN, date of birth, ID band Patient awake    Reviewed: Allergy & Precautions, NPO status , Patient's Chart, lab work & pertinent test results  Airway Mallampati: I  TM Distance: >3 FB Neck ROM: Full    Dental  (+) Teeth Intact, Dental Advisory Given   Pulmonary neg pulmonary ROS   Pulmonary exam normal breath sounds clear to auscultation       Cardiovascular hypertension, Normal cardiovascular exam Rhythm:Regular Rate:Normal     Neuro/Psych negative neurological ROS  negative psych ROS   GI/Hepatic negative GI ROS, Neg liver ROS,,,  Endo/Other  Hypothyroidism    Renal/GU negative Renal ROS  negative genitourinary   Musculoskeletal  (+) Arthritis ,    Abdominal   Peds  Hematology  (+) Blood dyscrasia, anemia Lab Results      Component                Value               Date                      WBC                      2.4 (L)             04/18/2022                HGB                      9.3 (L)             04/18/2022                HCT                      28.6 (L)            04/18/2022                MCV                      102.9 (H)           04/18/2022                PLT                      92 (L)              04/18/2022              Anesthesia Other Findings Metastatic rectal CA with biliary obstruction  Reproductive/Obstetrics                             Anesthesia Physical Anesthesia Plan  ASA: 3  Anesthesia Plan: General   Post-op Pain Management:    Induction: Intravenous  PONV Risk Score and Plan: 3 and Dexamethasone, Ondansetron and Treatment may vary due to age or medical condition  Airway Management Planned: Oral ETT  Additional Equipment:   Intra-op Plan:   Post-operative Plan: Extubation in OR  Informed Consent: I have reviewed the patients History and Physical, chart, labs and discussed the procedure including  the risks, benefits and alternatives for the proposed anesthesia with the patient or authorized representative who has indicated his/her understanding and acceptance.  Dental advisory given  Plan Discussed with: CRNA  Anesthesia Plan Comments:        Anesthesia Quick Evaluation

## 2022-04-18 NOTE — Op Note (Addendum)
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 Sherrill Medicines:                General Anesthesia, diclofenac 100 mg pr + on Unasyn Complications:            No immediate complications. Estimated Blood Loss:     Estimated blood loss was minimal. Procedure:                Pre-Anesthesia Assessment:                           - Prior to the procedure, a History and Physical                            was performed, and patient medications and                            allergies were reviewed. The patient's tolerance of                            previous anesthesia was also reviewed. The risks                            and benefits of the procedure and the sedation                            options and risks were discussed with the patient.                            All questions were answered, and informed consent                            was obtained. Prior Anticoagulants: The patient                            last took Lovenox (enoxaparin) 1 day prior to the                            procedure. ASA Grade Assessment: III - A patient                            with severe systemic disease. After reviewing the  risks and benefits, the patient was deemed in                            satisfactory condition to undergo the procedure.                           After obtaining informed consent, the scope was                            passed under direct vision. Throughout the                            procedure, the patient's blood  pressure, pulse, and                            oxygen saturations were monitored continuously. The                            [employer] single use                            duodenoscope was introduced through the mouth, and                            used to inject contrast into and used to inject                            contrast into the bile duct. The ERCP was somewhat                            difficult due to extrinsic compression. The patient                            tolerated the procedure well. Scope In: Scope Out: Findings:      The scout film was normal. Esophagus not seen well. Stomach and duodenum       grossly normal. No bile draining. papilla NL. Cannulated deeply into CBD       w/ wirre. Wire did go into pancreatic duct first. in the hilar area,       after contrast injection via sphincterotome there was paucity of       contrast into left intrahepatics, there was a dilated duct that tracked       to the right as well (suspect it is the cystic duct) and there were       normal caliber ducts in mid-section of liver that filled. Wire passed       into the left area above strictured area and contrast injected and       showed some mildly dilated ducts. Initially placed 8.5 Fr x 15 mm stent       but it protruded too far into duodenum so removed. Then dilated the       strictured area with 4 mm x 2 cm balloon. After that placed 12 x 8.5 fr       plastric stent with proximal tip just across strictured area. No bile  drainage but contrast did drain. The Other dilated and non dilated ducts       described did not really drain. Wire and catheter went into these areas       and i did not suspect obstruction or stricture there but lack of       drainage raises that ?. Gallbladder did fill at times but not       well-evaluated. No pancreatogram. 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 The strictures were malignant                            appearing. These strictures were treated with stent                            placement. Moderate Sedation:      Not Applicable - Patient had care per Anesthesia. Recommendation:           - return to floor - If ok tomorrow will stop Unasyn                           OK to use Lovenox at 0000                           May need MRCP to better understand this situation -                            the stent is into left system and just across the                            stricture (short distance) and that part of liver                            is atrophied. There is a dilated cystic duct                            9suspected)and some intrahepatic right liver and                            other more normal or mildly dilated ducts on right                            remaining without clear obstruction but did not                            drain well.                           recheck LFT's and regroup  tomorrow. Procedure Code(s):        --- Professional ---                           [phone], Endoscopic retrograde                            cholangiopancreatography (ERCP); with placement of                            endoscopic stent into biliary or pancreatic duct,                            including pre- and post-dilation and guide wire                            passage, when performed, including sphincterotomy,                            when performed, each stent Diagnosis Code(s):        --- Professional ---                           K83.1, Obstruction of bile duct                           R17, Unspecified jaundice CPT copyright 2022 American [employer] Association. All rights reserved. The codes documented in this report are preliminary and upon coder review may  be revised to meet current compliance requirements. Gatha Mayer,  MD 04/18/2022 3:17:06 PM This report has been signed electronically. Number of Addenda: 0

## 2022-04-18 NOTE — Progress Notes (Signed)
Progress Note    Samantha Lutz   [id number]  DOB: 1951/01/23  DOA: 04/17/2022     1 PCP: Andre Lefort, FNP  Initial CC: elevated total [facility] Course: Ms. Saeteurn is a 72 yo female with PMH rectal cancer with liver metastasis who presented with mild abdominal discomfort and outpatient elevated total bilirubin.  This has been trended and found to not be resolving.  MRI liver was obtained on 04/14/2022 which showed underlying hypoenhancing central liver mass measuring 2.7 x 1.9 cm compatible with solitary recurrent liver metastasis.  Also showed underlying associated high-grade malignant biliary hilar stricture.  She was admitted for GI evaluation and expedited endoscopy for further evaluation.  Interval History:  Resting in bed comfortably when seen this morning.  Very mild abdominal discomfort.  Denies any nausea/vomiting.  Understands plans are for EGD later today.  Assessment and Plan: * Biliary obstruction - Underwent MRI liver on 04/14/2022 showing high-grade malignant biliary hilar stricture - GI aware and patient undergoing EGD on 04/18/2022.  Follow-up findings -Continue on Unasyn postprocedure per GI  Rectal cancer ypT3ypN1a (1/23 LN) s/p neoadj chemoXRT, robotic LAR resection & diverting loop ileostomy 12/21/2017 - Follows closely outpatient with oncology; has been needed with Lonsurf; cycle 2 placed on hold recently due to hyperTB  Hypothyroid - Continue Synthroid  Hypertension - relatively controlled -No home meds noted on med rec   Old records reviewed in assessment of this patient  Antimicrobials: Unasyn 04/18/22 >> current   DVT prophylaxis:  SCDs Start: 04/17/22 1934   Code Status:   Code Status: Full Code  Mobility Assessment (last 72 hours)     Mobility Assessment     Row Name 04/17/22 1940 04/17/22 1700         Does patient have an order for bedrest or is patient medically unstable No - Continue assessment No - Continue  assessment      What is the highest level of mobility based on the progressive mobility assessment? Level 6 (Walks independently in room and hall) - Balance while walking in room without assist - Complete Level 6 (Walks independently in room and hall) - Balance while walking in room without assist - Complete               Barriers to discharge:  Disposition Plan:  Home Status is: Inpt  Objective: Blood pressure (!) 142/64, pulse 77, temperature 98.3 F (36.8 C), temperature source Oral, resp. rate 18, height 5\' 3"  (1.6 m), weight 91.3 kg, SpO2 98 %.  Examination:  Physical Exam Constitutional:      Appearance: Normal appearance.  HENT:     Head: Normocephalic and atraumatic.     Mouth/Throat:     Mouth: Mucous membranes are moist.  Eyes:     Extraocular Movements: Extraocular movements intact.  Cardiovascular:     Rate and Rhythm: Normal rate and regular rhythm.  Pulmonary:     Effort: Pulmonary effort is normal.     Breath sounds: Normal breath sounds.  Abdominal:     General: Bowel sounds are normal. There is no distension.     Palpations: Abdomen is soft.     Tenderness: There is no abdominal tenderness.  Musculoskeletal:        General: Normal range of motion.     Cervical back: Normal range of motion and neck supple.  Skin:    General: Skin is warm and dry.  Neurological:     General: No focal deficit present.  Mental Status: She is alert.  Psychiatric:        Mood and Affect: Mood normal.        Behavior: Behavior normal.      Consultants:  GI  Procedures:    Data Reviewed: Results for orders placed or performed during the hospital encounter of 04/17/22 (from the past 24 hour(s))  CBC with Differential/Platelet     Status: Abnormal   Collection Time: 04/17/22  4:45 PM  Result Value Ref Range   WBC 5.0 4.0 - 10.5 K/uL   RBC 3.19 (L) 3.87 - 5.11 MIL/uL   Hemoglobin 10.7 (L) 12.0 - 15.0 g/dL   HCT 32.7 (L) 36.0 - 46.0 %   MCV 102.5 (H) 80.0 -  100.0 fL   MCH 33.5 26.0 - 34.0 pg   MCHC 32.7 30.0 - 36.0 g/dL   RDW 17.9 (H) 11.5 - 15.5 %   Platelets 111 (L) 150 - 400 K/uL   nRBC 0.0 0.0 - 0.2 %   Neutrophils Relative % 72 %   Neutro Abs 3.6 1.7 - 7.7 K/uL   Lymphocytes Relative 12 %   Lymphs Abs 0.6 (L) 0.7 - 4.0 K/uL   Monocytes Relative 12 %   Monocytes Absolute 0.6 0.1 - 1.0 K/uL   Eosinophils Relative 2 %   Eosinophils Absolute 0.1 0.0 - 0.5 K/uL   Basophils Relative 1 %   Basophils Absolute 0.0 0.0 - 0.1 K/uL   Immature Granulocytes 1 %   Abs Immature Granulocytes 0.04 0.00 - 0.07 K/uL  CBC     Status: Abnormal   Collection Time: 04/17/22  7:34 PM  Result Value Ref Range   WBC 3.6 (L) 4.0 - 10.5 K/uL   RBC 2.95 (L) 3.87 - 5.11 MIL/uL   Hemoglobin 9.9 (L) 12.0 - 15.0 g/dL   HCT 30.2 (L) 36.0 - 46.0 %   MCV 102.4 (H) 80.0 - 100.0 fL   MCH 33.6 26.0 - 34.0 pg   MCHC 32.8 30.0 - 36.0 g/dL   RDW 17.8 (H) 11.5 - 15.5 %   Platelets 109 (L) 150 - 400 K/uL   nRBC 0.0 0.0 - 0.2 %  Creatinine, serum     Status: None   Collection Time: 04/17/22  7:34 PM  Result Value Ref Range   Creatinine, Ser 0.51 0.44 - 1.00 mg/dL   GFR, Estimated >60 >60 mL/min  Comprehensive metabolic panel     Status: Abnormal   Collection Time: 04/18/22  5:28 AM  Result Value Ref Range   Sodium 139 135 - 145 mmol/L   Potassium 3.5 3.5 - 5.1 mmol/L   Chloride 108 98 - 111 mmol/L   CO2 23 22 - 32 mmol/L   Glucose, Bld 111 (H) 70 - 99 mg/dL   BUN 12 8 - 23 mg/dL   Creatinine, Ser 0.68 0.44 - 1.00 mg/dL   Calcium 7.9 (L) 8.9 - 10.3 mg/dL   Total Protein 5.5 (L) 6.5 - 8.1 g/dL   Albumin 2.7 (L) 3.5 - 5.0 g/dL   AST 140 (H) 15 - 41 U/L   ALT 93 (H) 0 - 44 U/L   Alkaline Phosphatase 168 (H) 38 - 126 U/L   Total Bilirubin 7.8 (H) 0.3 - 1.2 mg/dL   GFR, Estimated >60 >60 mL/min   Anion gap 8 5 - 15  CBC     Status: Abnormal   Collection Time: 04/18/22  5:28 AM  Result Value Ref Range   WBC 2.4 (L) 4.0 -  10.5 K/uL   RBC 2.78 (L) 3.87 - 5.11  MIL/uL   Hemoglobin 9.3 (L) 12.0 - 15.0 g/dL   HCT 28.6 (L) 36.0 - 46.0 %   MCV 102.9 (H) 80.0 - 100.0 fL   MCH 33.5 26.0 - 34.0 pg   MCHC 32.5 30.0 - 36.0 g/dL   RDW 18.2 (H) 11.5 - 15.5 %   Platelets 92 (L) 150 - 400 K/uL   nRBC 0.0 0.0 - 0.2 %    I have reviewed pertinent nursing notes, vitals, labs, and images as necessary. I have ordered labwork to follow up on as indicated.  I have reviewed the last notes from staff over past 24 hours. I have discussed patient's care plan and test results with nursing staff, CM/SW, and other staff as appropriate.  Time spent: Greater than 50% of the 55 minute visit was spent in counseling/coordination of care for the patient as laid out in the A&P.   LOS: 1 day   Dwyane Dee, MD [facility] 04/18/2022, 11:48 AM

## 2022-04-19 ENCOUNTER — Other Ambulatory Visit: Payer: Self-pay

## 2022-04-19 DIAGNOSIS — C787 Secondary malignant neoplasm of liver and intrahepatic bile duct: Secondary | ICD-10-CM | POA: Diagnosis not present

## 2022-04-19 DIAGNOSIS — C20 Malignant neoplasm of rectum: Secondary | ICD-10-CM | POA: Diagnosis not present

## 2022-04-19 DIAGNOSIS — D61810 Antineoplastic chemotherapy induced pancytopenia: Secondary | ICD-10-CM | POA: Diagnosis not present

## 2022-04-19 DIAGNOSIS — K859 Acute pancreatitis without necrosis or infection, unspecified: Secondary | ICD-10-CM

## 2022-04-19 DIAGNOSIS — K9189 Other postprocedural complications and disorders of digestive system: Secondary | ICD-10-CM

## 2022-04-19 DIAGNOSIS — K831 Obstruction of bile duct: Secondary | ICD-10-CM | POA: Diagnosis not present

## 2022-04-19 DIAGNOSIS — I8500 Esophageal varices without bleeding: Secondary | ICD-10-CM | POA: Diagnosis not present

## 2022-04-19 LAB — COMPREHENSIVE METABOLIC PANEL
ALT: 106 U/L — ABNORMAL HIGH (ref 0–44)
AST: 148 U/L — ABNORMAL HIGH (ref 15–41)
Albumin: 2.8 g/dL — ABNORMAL LOW (ref 3.5–5.0)
Alkaline Phosphatase: 190 U/L — ABNORMAL HIGH (ref 38–126)
Anion gap: 9 (ref 5–15)
BUN: 13 mg/dL (ref 8–23)
CO2: 24 mmol/L (ref 22–32)
Calcium: 8.8 mg/dL — ABNORMAL LOW (ref 8.9–10.3)
Chloride: 107 mmol/L (ref 98–111)
Creatinine, Ser: 0.55 mg/dL (ref 0.44–1.00)
GFR, Estimated: >60 mL/min (ref >60)
Glucose, Bld: 130 mg/dL — ABNORMAL HIGH (ref 70–99)
Potassium: 4.3 mmol/L (ref 3.5–5.1)
Sodium: 140 mmol/L (ref 135–145)
Total Bilirubin: 12.7 mg/dL — ABNORMAL HIGH (ref 0.3–1.2)
Total Protein: 6.1 g/dL — ABNORMAL LOW (ref 6.5–8.1)

## 2022-04-19 LAB — CBC WITH DIFFERENTIAL/PLATELET
Abs Immature Granulocytes: 0.04 10*3/uL (ref 0.00–0.07)
Basophils Absolute: 0 10*3/uL (ref 0.0–0.1)
Basophils Relative: 0 %
Eosinophils Absolute: 0 10*3/uL (ref 0.0–0.5)
Eosinophils Relative: 0 %
HCT: 31.9 % — ABNORMAL LOW (ref 36.0–46.0)
Hemoglobin: 10.5 g/dL — ABNORMAL LOW (ref 12.0–15.0)
Immature Granulocytes: 1 %
Lymphocytes Relative: 5 %
Lymphs Abs: 0.3 10*3/uL — ABNORMAL LOW (ref 0.7–4.0)
MCH: 33.8 pg (ref 26.0–34.0)
MCHC: 32.9 g/dL (ref 30.0–36.0)
MCV: 102.6 fL — ABNORMAL HIGH (ref 80.0–100.0)
Monocytes Absolute: 0.4 10*3/uL (ref 0.1–1.0)
Monocytes Relative: 7 %
Neutro Abs: 5.1 10*3/uL (ref 1.7–7.7)
Neutrophils Relative %: 87 %
Platelets: 106 10*3/uL — ABNORMAL LOW (ref 150–400)
RBC: 3.11 MIL/uL — ABNORMAL LOW (ref 3.87–5.11)
RDW: 18.1 % — ABNORMAL HIGH (ref 11.5–15.5)
WBC: 5.9 10*3/uL (ref 4.0–10.5)
nRBC: 0 % (ref 0.0–0.2)

## 2022-04-19 LAB — LIPASE, BLOOD: Lipase: 6048 U/L — ABNORMAL HIGH (ref 11–51)

## 2022-04-19 MED ORDER — BOOST / RESOURCE BREEZE PO LIQD CUSTOM
1.0000 | Freq: Three times a day (TID) | ORAL | Status: DC
  Administered 2022-04-19 – 2022-05-02 (×10): 1 via ORAL

## 2022-04-19 MED ORDER — PROCHLORPERAZINE EDISYLATE 10 MG/2ML IJ SOLN
10.0000 mg | Freq: Four times a day (QID) | INTRAMUSCULAR | Status: AC | PRN
  Administered 2022-04-20 – 2022-04-27 (×2): 10 mg via INTRAVENOUS
  Filled 2022-04-19 (×2): qty 2

## 2022-04-19 MED ORDER — PROMETHAZINE HCL 25 MG PO TABS: 25.0000 mg | ORAL_TABLET | Freq: Four times a day (QID) | ORAL | Status: DC | PRN

## 2022-04-19 MED ORDER — ALUM & MAG HYDROXIDE-SIMETH 200-200-20 MG/5ML PO SUSP
30.0000 mL | ORAL | Status: DC | PRN
  Administered 2022-04-19 – 2022-04-30 (×3): 30 mL via ORAL
  Filled 2022-04-19 (×4): qty 30

## 2022-04-19 MED ORDER — SODIUM CHLORIDE 0.9 % IV SOLN
6.2500 mg | Freq: Four times a day (QID) | INTRAVENOUS | Status: DC | PRN
  Administered 2022-04-19: 6.25 mg via INTRAVENOUS
  Filled 2022-04-19 (×8): qty 0.25

## 2022-04-19 MED ORDER — PROMETHAZINE HCL 25 MG RE SUPP: 25.0000 mg | Freq: Four times a day (QID) | RECTAL | Status: DC | PRN

## 2022-04-19 NOTE — TOC Progression Note (Signed)
Transition of Care [facility]) - Progression Note    Patient Details  Name: Samantha Lutz MRN: [id number] Date of Birth: Apr 30, 1950  Transition of Care [facility]) CM/SW [place], RN Phone [phone]  04/19/2022, 4:08 PM  Clinical Narrative:     Transition of Care [facility]) Screening Note   Patient Details  Name: Samantha Lutz Date of Birth: 1950-04-14   Transition of Care [facility]) CM/SW Contact:    Angelita Ingles, RN Phone Number: 04/19/2022, 4:09 PM    Transition of Care Department [facility]) has reviewed patient and no TOC needs have been identified at this time. We will continue to monitor patient advancement through interdisciplinary progression rounds. If new patient transition needs arise, please place a TOC consult.          Expected Discharge Plan and Services                                               Social Determinants of Health (SDOH) Interventions SDOH Screenings   Food Insecurity: No Food Insecurity (04/18/2022)  Housing: Low Risk  (04/18/2022)  Transportation Needs: No Transportation Needs (04/18/2022)  Utilities: Not At Risk (04/18/2022)  Depression (PHQ2-9): Low Risk  (11/01/2018)  Financial Resource Strain: Low Risk  (11/01/2021)  Social Connections: Socially Isolated (11/01/2021)  Tobacco Use: Low Risk  (04/18/2022)    Readmission Risk Interventions     No data to display

## 2022-04-19 NOTE — Progress Notes (Signed)
Initial Nutrition Assessment  DOCUMENTATION CODES:   Obesity unspecified  INTERVENTION:   -Boost Breeze po TID, each supplement provides 250 kcal and 9 grams of protein   NUTRITION DIAGNOSIS:   Increased nutrient needs related to chronic illness as evidenced by estimated needs.  GOAL:   Patient will meet greater than or equal to 90% of their needs  MONITOR:   PO intake, Supplement acceptance, Diet advancement, Weight trends, Labs, I & O's  REASON FOR ASSESSMENT:   Malnutrition Screening Tool    ASSESSMENT:   72 yo female with PMH rectal cancer with liver metastasis who presented with mild abdominal discomfort and outpatient elevated total bilirubin.  MRI liver was obtained on 04/14/2022 which showed underlying hypoenhancing central liver mass measuring 2.7 x 1.9 cm compatible with solitary recurrent liver metastasis.  Also showed underlying associated high-grade malignant biliary hilar stricture.  3/19: ERCP, s/p biliary stent placement  Patient currently ordered clear liquid diet. Pt states she was told she couldn't have anything this morning d/t GI ordering MRCP.  Pt is having abdominal pain and some nausea. Was willing to try protein supplements. She does drink Ensure at home but the original version with lower protein as she doesn't like higher protein supplements. Will order Boost Breeze for later.  Pt reports no appetite for weeks. Prior to this, she was trying to eat 3 meals a day but doesn't eat full meals. Her appetite changed after her colon surgery in 2019.  Last meal was on 3/18, cheeseburger ,fries, jello and some cake.   Per weight records, pt has lost 10 lbs since 11/01/21 (4% wt loss x 5 months, insignificant for time frame).  Medications: Lactated ringers, Zofran  Labs reviewed.  NUTRITION - FOCUSED PHYSICAL EXAM:  Flowsheet Row Most Recent Value  Orbital Region Mild depletion  [bruising]  Upper Arm Region No depletion  Thoracic and Lumbar Region No  depletion  Buccal Region No depletion  Temple Region No depletion  Clavicle Bone Region No depletion  Clavicle and Acromion Bone Region No depletion  Scapular Bone Region No depletion  Dorsal Hand No depletion  Patellar Region No depletion  Anterior Thigh Region No depletion  Posterior Calf Region No depletion  Edema (RD Assessment) None  Hair Reviewed  [thinning]  Eyes Reviewed  Mouth Reviewed  Skin Reviewed  [ecchymosis]  Nails Reviewed       Diet Order:   Diet Order             Diet clear liquid Room service appropriate? Yes; Fluid consistency: Thin  Diet effective now                   EDUCATION NEEDS:   No education needs have been identified at this time  Skin:  Skin Assessment: Reviewed RN Assessment  Last BM:  3/16  Height:   Ht Readings from Last 1 Encounters:  04/18/22 5\' 3"  (1.6 m)    Weight:   Wt Readings from Last 1 Encounters:  04/18/22 91.3 kg    BMI:  Body mass index is 35.66 kg/m.  Estimated Nutritional Needs:   Kcal:  1550-1750  Protein:  75-90g  Fluid:  1.7L/day  Clayton Bibles, MS, RD, LDN Inpatient Clinical Dietitian Contact information available via Amion

## 2022-04-19 NOTE — Progress Notes (Signed)
PROGRESS NOTE    Samantha Lutz  [id number] DOB: Sep 23, 1950 DOA: 04/17/2022 PCP: Andre Lefort, FNP  No chief complaint on file.   Brief Narrative:   Samantha Lutz is Samantha Lutz 72 yo female with PMH rectal cancer with liver metastasis who presented with mild abdominal discomfort and outpatient elevated total bilirubin.  This has been trended and found to not be resolving.  MRI liver was obtained on 04/14/2022 which showed underlying hypoenhancing central liver mass measuring 2.7 x 1.9 cm compatible with solitary recurrent liver metastasis.  Also showed underlying associated high-grade malignant biliary hilar stricture.  She was admitted for GI evaluation and expedited endoscopy for further evaluation.  Assessment & Plan:   Principal Problem:   Biliary obstruction Active Problems:   Rectal cancer ypT3ypN1a (1/23 LN) s/p neoadj chemoXRT, robotic LAR resection & diverting loop ileostomy 12/21/2017   Hypertension   Hypothyroid  Biliary obstruction - Underwent MRI liver on 04/14/2022 showing high-grade malignant biliary hilar stricture - s/p ERCP 3/19 with localized biliary strictures found in the hepatitic duct system from extrahepatic mestastasis.  Strictures malignant appearing.  Treated with stent palcement.  - unasyn, per GI    Rectal cancer ypT3ypN1a (1/23 LN) s/p neoadj chemoXRT, robotic LAR resection & diverting loop ileostomy 12/21/2017 - Follows closely outpatient with oncology; has been needed with Lonsurf; cycle 2 placed on hold recently due to LFT's   Hypothyroid - Continue Synthroid   Hypertension - recently more elevated - will continue to monitor for now, consider adding meds (treat pain, discomfort first)    DVT prophylaxis: lovenox Code Status: full Family Communication: none Disposition:   Status is: Inpatient Remains inpatient appropriate because: worsening LFT's, further GI recs pending   Consultants:  GI  Procedures:  ERCP  Antimicrobials:   Anti-infectives (From admission, onward)    Start     Dose/Rate Route Frequency Ordered Stop   04/18/22 1230  ampicillin-sulbactam (UNASYN) 1.5 g in sodium chloride 0.9 % 100 mL IVPB  Status:  Discontinued        1.5 g 200 mL/hr over 30 Minutes Intravenous  Once 04/18/22 1227 04/18/22 1531   04/18/22 1100  ampicillin-sulbactam (UNASYN) 1.5 g in sodium chloride 0.9 % 100 mL IVPB        1.5 g 200 mL/hr over 30 Minutes Intravenous Every 6 hours 04/18/22 0958         Subjective: No new complaints, discussed need for MRCP   Objective: Vitals:   04/18/22 1510 04/18/22 2052 04/19/22 0438 04/19/22 1337  BP: (!) 148/56 (!) 152/77 (!) 154/79 (!) 181/80  Pulse: 83 75 71 79  Resp: 11 16 16 18   Temp:  98.1 F (36.7 C) 97.9 F (36.6 C) 98.3 F (36.8 C)  TempSrc:  Oral Oral Oral  SpO2: 98% 95% 97% 100%  Weight:      Height:        Intake/Output Summary (Last 24 hours) at 04/19/2022 1408 Last data filed at 04/19/2022 1019 Gross per 24 hour  Intake 2730.46 ml  Output 0 ml  Net 2730.46 ml   Filed Weights   04/17/22 1702 04/18/22 1230  Weight: 91.3 kg 91.3 kg    Examination:  General exam: Appears calm and comfortable  Respiratory system: unlabored. Cardiovascular system: RRR Gastrointestinal system: mildly diffusely TTP Central nervous system: Alert and oriented. No focal neurological deficits. Extremities: no LEE   Data Reviewed: I have personally reviewed following labs and imaging studies  CBC: Recent Labs  Lab 04/13/22 1052 04/17/22  1645 04/17/22 1934 04/18/22 0528 04/19/22 0500  WBC 3.9* 5.0 3.6* 2.4* 5.9  NEUTROABS 2.9 3.6  --   --  5.1  HGB 10.7* 10.7* 9.9* 9.3* 10.5*  HCT 31.6* 32.7* 30.2* 28.6* 31.9*  MCV 100.0 102.5* 102.4* 102.9* 102.6*  PLT 109* 111* 109* 92* 106*    Basic Metabolic Panel: Recent Labs  Lab 04/13/22 1052 04/17/22 0841 04/17/22 1934 04/18/22 0528 04/19/22 0500  NA 140 141  --  139 140  K 3.3* 3.7  --  3.5 4.3  CL 109 108  --   108 107  CO2 23 24  --  23 24  GLUCOSE 129* 101*  --  111* 130*  BUN 11 13  --  12 13  CREATININE 0.77 0.81 0.51 0.68 0.55  CALCIUM 8.6* 9.2  --  7.9* 8.8*  MG 1.9  --   --   --   --     GFR: Estimated Creatinine Clearance: 67.2 mL/min (by C-G formula based on SCr of 0.55 mg/dL).  Liver Function Tests: Recent Labs  Lab 04/13/22 1052 04/17/22 0841 04/18/22 0528 04/19/22 0500  AST 135* 134* 140* 148*  ALT 92* 103* 93* 106*  ALKPHOS 161* 215* 168* 190*  BILITOT 6.7* 8.0* 7.8* 12.7*  PROT 6.0* 6.4* 5.5* 6.1*  ALBUMIN 3.2* 3.5 2.7* 2.8*    CBG: No results for input(s): "GLUCAP" in the last 168 hours.   No results found for this or any previous visit (from the past 240 hour(s)).       Radiology Studies: DG ERCP  Result Date: 04/18/2022 CLINICAL DATA:  Liver mass. EXAM: ERCP TECHNIQUE: Multiple spot images obtained with the fluoroscopic device and submitted for interpretation post-procedure. FLUOROSCOPY TIME: FLUOROSCOPY TIME 30 minutes, 40 seconds (434.4 mGy) COMPARISON:  CT abdomen pelvis-04/13/2022; abdominal MRI-04/14/2022 FINDINGS: Ten spot intraoperative fluoroscopic images of the right upper abdominal quadrant during ERCP are provided for review Initial image demonstrates an ERCP probe overlying the right upper abdominal quadrant. Subsequent images demonstrate selective cannulation and opacification of the common bile duct which appears nondilated. There is tapered narrowing/subtotal occlusion of the common hepatic duct at the level of the biliary hilum with subsequent opacification of the left intrahepatic biliary system and ultimately with internal biliary stent placement. There is minimal opacification of the cystic duct with partial opacification of the gallbladder. IMPRESSION: ERCP with suspected malignant subtotal occlusion of the intrahepatic biliary tree at the level of the biliary hilum with subsequent biliary stent placement. These images were submitted for  radiologic interpretation only. Please see the procedural report for the amount of contrast and the fluoroscopy time utilized. Electronically Signed   By: Sandi Mariscal M.D.   On: 04/18/2022 15:12   DG C-Arm 1-60 Min-No Report  Result Date: 04/18/2022 Fluoroscopy was utilized by the requesting physician.  No radiographic interpretation.        Scheduled Meds:  Chlorhexidine Gluconate Cloth  6 each Topical Daily   diclofenac  100 mg Rectal Once   enoxaparin (LOVENOX) injection  50 mg Subcutaneous Q24H   levothyroxine  50 mcg Oral Daily   predniSONE  5 mg Oral Q breakfast   Continuous Infusions:  ampicillin-sulbactam (UNASYN) IV 1.5 g (04/19/22 1125)   lactated ringers 1,000 mL with potassium chloride 20 mEq infusion 75 mL/hr at 04/19/22 0413   promethazine (PHENERGAN) injection (IM or IVPB)       LOS: 2 days    Time spent: over 30 min    Fayrene Helper, MD  [facility]   To contact the attending provider between 7A-7P or the covering provider during after hours 7P-7A, please log into the web site [email or web address] and access using universal [place] password for that web site. If you do not have the password, please call the hospital operator.  04/19/2022, 2:08 PM

## 2022-04-19 NOTE — Progress Notes (Addendum)
Daily Progress Note  DOA: 04/17/2022 Hospital Day: 3 Chief Complaint: metastatic colon cancer   ASSESSMENT   72 yo female with the following:  Rectal cancer diagnosed in 2019 s/p chemotherapy / radiation and LAR and ileostomy. 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   Progressive jaundice secondary to liver mets / malignant biliary stricuture.   MRI liver  showing new liver mass c/w hypoenhancing 2.7 x 1.9 cm solitary recurrent liver metastasis. Associated high-grade malignant biliary hilar stricture. Underwent ERCP on 3/19 with findings of a malignancy appearing biliary stricture in hepatic duct from extrahepatic metastasis. Stent placed. Today: alk phos is 190. Tbili up from 7.6 12.7, liver enzymes about the same. Having mid upper abdominal pain today   PLAN  -Still on Unasyn for now -MRCP has been ordered to further evaluate bile duct abnormalities seen on ERCP yesterday . -Am liver tests --------------------------------------------------------------------------------------    [facility] Attending   I have taken an interval history, reviewed the chart and examined the patient. I agree with the Advanced Practitioner's note, impression and recommendations and the following:  She has post-ERCP pancreatitis by sxs and signs Bili can go uop after stent placement initially - will have to see how that goes in next couple of days but I am not confident stent is going to treat all obstruction. Am hoping I can get more info from MRCP.  Will increase LR to help treat pancreatitis F/U labs and MRCP Pain/emesis Tx  Gatha Mayer, MD, [facility] See Shea Evans on call - gastroenterology for best contact person 04/19/2022 8:20 PM  Subjective / New events:  Around 10 pm last night she began having mid upper abdominal pain radiating through to her back with associated nausea. Still having same discomfort this am but pain meds do help   Lab  Results: Recent Labs    04/17/22 1934 04/18/22 0528 04/19/22 0500  WBC 3.6* 2.4* 5.9  HGB 9.9* 9.3* 10.5*  HCT 30.2* 28.6* 31.9*  PLT 109* 92* 106*   BMET Recent Labs    04/17/22 0841 04/17/22 1934 04/18/22 0528 04/19/22 0500  NA 141  --  139 140  K 3.7  --  3.5 4.3  CL 108  --  108 107  CO2 24  --  23 24  GLUCOSE 101*  --  111* 130*  BUN 13  --  12 13  CREATININE 0.81 0.51 0.68 0.55  CALCIUM 9.2  --  7.9* 8.8*   LFT Recent Labs    04/19/22 0500  PROT 6.1*  ALBUMIN 2.8*  AST 148*  ALT 106*  ALKPHOS 190*  BILITOT 12.7*   PT/INR Recent Labs    04/17/22 0841  LABPROT 13.9  INR 1.1   Lab Results  Component Value Date   LIPASE 6,048 (H) 04/19/2022     Scheduled inpatient medications:   Chlorhexidine Gluconate Cloth  6 each Topical Daily   diclofenac  100 mg Rectal Once   enoxaparin (LOVENOX) injection  50 mg Subcutaneous Q24H   levothyroxine  50 mcg Oral Daily   predniSONE  5 mg Oral Q breakfast   Continuous inpatient infusions:   ampicillin-sulbactam (UNASYN) IV 1.5 g (04/19/22 0538)   lactated ringers 1,000 mL with potassium chloride 20 mEq infusion 75 mL/hr at 04/19/22 0413   PRN inpatient medications: acetaminophen **OR** acetaminophen, alum & mag hydroxide-simeth, ondansetron **OR** ondansetron (ZOFRAN) IV, oxyCODONE, traZODone  Vital signs in last 24 hours: Temp:  [97.9 F (36.6  C)-98.2 F (36.8 C)] 97.9 F (36.6 C) (03/20 0438) Pulse Rate:  [71-88] 71 (03/20 0438) Resp:  [11-20] 16 (03/20 0438) BP: (148-180)/(56-79) 154/79 (03/20 0438) SpO2:  [95 %-100 %] 97 % (03/20 0438) Weight:  [91.3 kg] 91.3 kg (03/19 1230) Last BM Date : 04/15/22 (Pt stated.)  Intake/Output Summary (Last 24 hours) at 04/19/2022 1049 Last data filed at 04/19/2022 1019 Gross per 24 hour  Intake 2730.46 ml  Output 0 ml  Net 2730.46 ml    Intake/Output from previous day: 03/19 0701 - 03/20 0700 In: 2490.5 [I.V.:2190.5; IV Piggyback:300] Out: 0  Intake/Output  this shift: Total I/O In: 240 [P.O.:240] Out: -    Physical Exam:  General: Alert female in NAD Heart:  Regular rate and rhythm.  Pulmonary: Normal respiratory effort Abdomen: Soft, nondistended, nontender. Normal bowel sounds. Extremities: No lower extremity edema  Neurologic: Alert and oriented Psych: Pleasant. Cooperative. Insight appears normal.         LOS: 2 days   [name] ,NP 04/19/2022, 10:49 AM

## 2022-04-19 NOTE — Progress Notes (Signed)
Mobility Specialist - Progress Note   04/19/22 1214  Mobility  Activity Ambulated with assistance in hallway  Level of Assistance Independent after set-up  Assistive Device Front wheel walker  Distance Ambulated (ft) 250 ft  Activity Response Tolerated well  Mobility Referral Yes  $Mobility charge 1 Mobility   Pt received in room standing and agreeable to mobility. C/o nausea prior to ambulating but still eager to ambulate. At EOS pt sated the nausea was still the same. No other complaints during session. Pt to bed after session w/ all needs met.   Dr. [facility]

## 2022-04-20 ENCOUNTER — Inpatient Hospital Stay (HOSPITAL_COMMUNITY): Payer: Medicare Other

## 2022-04-20 DIAGNOSIS — K831 Obstruction of bile duct: Secondary | ICD-10-CM | POA: Diagnosis not present

## 2022-04-20 DIAGNOSIS — K9189 Other postprocedural complications and disorders of digestive system: Secondary | ICD-10-CM | POA: Diagnosis not present

## 2022-04-20 DIAGNOSIS — K859 Acute pancreatitis without necrosis or infection, unspecified: Secondary | ICD-10-CM | POA: Diagnosis not present

## 2022-04-20 LAB — LIPASE, BLOOD: Lipase: 1042 U/L — ABNORMAL HIGH (ref 11–51)

## 2022-04-20 LAB — COMPREHENSIVE METABOLIC PANEL
ALT: 136 U/L — ABNORMAL HIGH (ref 0–44)
AST: 212 U/L — ABNORMAL HIGH (ref 15–41)
Albumin: 2.7 g/dL — ABNORMAL LOW (ref 3.5–5.0)
Alkaline Phosphatase: 223 U/L — ABNORMAL HIGH (ref 38–126)
Anion gap: 10 (ref 5–15)
BUN: 11 mg/dL (ref 8–23)
CO2: 21 mmol/L — ABNORMAL LOW (ref 22–32)
Calcium: 8.1 mg/dL — ABNORMAL LOW (ref 8.9–10.3)
Chloride: 99 mmol/L (ref 98–111)
Creatinine, Ser: 0.39 mg/dL — ABNORMAL LOW (ref 0.44–1.00)
GFR, Estimated: >60 mL/min (ref >60)
Glucose, Bld: 98 mg/dL (ref 70–99)
Potassium: 4.1 mmol/L (ref 3.5–5.1)
Sodium: 130 mmol/L — ABNORMAL LOW (ref 135–145)
Total Bilirubin: 19.1 mg/dL (ref 0.3–1.2)
Total Protein: 6 g/dL — ABNORMAL LOW (ref 6.5–8.1)

## 2022-04-20 LAB — CBC
HCT: 34.6 % — ABNORMAL LOW (ref 36.0–46.0)
Hemoglobin: 11.6 g/dL — ABNORMAL LOW (ref 12.0–15.0)
MCH: 33.7 pg (ref 26.0–34.0)
MCHC: 33.5 g/dL (ref 30.0–36.0)
MCV: 100.6 fL — ABNORMAL HIGH (ref 80.0–100.0)
Platelets: 102 10*3/uL — ABNORMAL LOW (ref 150–400)
RBC: 3.44 MIL/uL — ABNORMAL LOW (ref 3.87–5.11)
RDW: 18.9 % — ABNORMAL HIGH (ref 11.5–15.5)
WBC: 11.6 10*3/uL — ABNORMAL HIGH (ref 4.0–10.5)
nRBC: 0 % (ref 0.0–0.2)

## 2022-04-20 MED ORDER — GADOBUTROL 1 MMOL/ML IV SOLN
9.0000 mL | Freq: Once | INTRAVENOUS | Status: AC | PRN
  Administered 2022-04-20: 9 mL via INTRAVENOUS

## 2022-04-20 MED ORDER — LACTATED RINGERS IV SOLN: INTRAVENOUS | Status: DC

## 2022-04-20 MED ORDER — HYDROMORPHONE HCL 1 MG/ML IJ SOLN
0.5000 mg | INTRAMUSCULAR | Status: DC | PRN
  Administered 2022-04-21 – 2022-05-04 (×13): 0.5 mg via INTRAVENOUS
  Filled 2022-04-20 (×13): qty 0.5

## 2022-04-20 NOTE — H&P (View-Only) (Signed)
Daily Progress Note  DOA: 04/17/2022 Hospital Day: 4 Chief Complaint: Jaundice   ASSESSMENT    72 yo female with the following:   Rectal cancer diagnosed in 2019 s/p chemotherapy / radiation and LAR and ileostomy. 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    Progressive jaundice secondary to liver mets / malignant biliary stricuture.   S/p ERCP 3/19 with findings of a malignancy appearing biliary stricture in hepatic duct from extrahepatic metastasis. Stent placed across stricture in left system . There were dilated ducts in the right system of unclear etiology so MRCP ordered to try and figure things out. Becoming more cholestatic with bilirubin up to 19 today.   WBC up slightly to 11.6. On Unasyn  Addendum: MRCP reviewed.  The biliary stent appears to extend from the duodenum to the vicinity of a central mass near the confluence of the right and left hepatic ducts. There is mild right and left intrahepatic biliary dilatation indicating that the stent may possibly not be fully traversing the mass, or may only be partially traversing the mass.   Patient will need EGD today to remove the biliary stent. The risks and benefits of EGD with possible biopsies were discussed with the patient who agrees to proceed.    Post-ERCP pancreatitis.  Hct 34% Cr 0.39 Getting LR at 100 ml /hr Upper abdominal pain has improved compared to yesterday   PLAN   Awaiting MRCP results  -------------------------------------------------------------------------------------    [place] [facility] Attending   I have taken an interval history, reviewed the chart and examined the patient. I agree with the Advanced Practitioner's note, impression and recommendations.    Will remove biliary stent tomorrow Have consulted IR to help treat hilar obstruction  Samantha Mayer, MD, [facility] See Shea Evans on call - gastroenterology for best contact person 04/20/2022 5:37  PM   Subjective / New events:  Upper abdominal pain not resolved but is better compared to yesterday. No N/V. Tolerating clears   Lab Results: Recent Labs    04/18/22 0528 04/19/22 0500 04/20/22 0500  WBC 2.4* 5.9 11.6*  HGB 9.3* 10.5* 11.6*  HCT 28.6* 31.9* 34.6*  PLT 92* 106* 102*   BMET Recent Labs    04/18/22 0528 04/19/22 0500 04/20/22 0500  NA 139 140 130*  K 3.5 4.3 4.1  CL 108 107 99  CO2 23 24 21*  GLUCOSE 111* 130* 98  BUN 12 13 11   CREATININE 0.68 0.55 0.39*  CALCIUM 7.9* 8.8* 8.1*   LFT Recent Labs    04/20/22 0500  PROT 6.0*  ALBUMIN 2.7*  AST 212*  ALT 136*  ALKPHOS 223*  BILITOT 19.1*   PT/INR No results for input(s): "LABPROT", "INR" in the last 72 hours.   Scheduled inpatient medications:   Chlorhexidine Gluconate Cloth  6 each Topical Daily   diclofenac  100 mg Rectal Once   enoxaparin (LOVENOX) injection  50 mg Subcutaneous Q24H   feeding supplement  1 Container Oral TID BM   levothyroxine  50 mcg Oral Daily   predniSONE  5 mg Oral Q breakfast   Continuous inpatient infusions:   ampicillin-sulbactam (UNASYN) IV 1.5 g (04/20/22 0721)   lactated ringers 1,000 mL with potassium chloride 20 mEq infusion 100 mL/hr at 04/20/22 0347   PRN inpatient medications: acetaminophen **OR** acetaminophen, alum & mag hydroxide-simeth, ondansetron **OR** ondansetron (ZOFRAN) IV, oxyCODONE, prochlorperazine, traZODone  Vital signs in last 24 hours: Temp:  [98.3 F (36.8 C)-98.7 F (  37.1 C)] 98.3 F (36.8 C) (03/21 0516) Pulse Rate:  [76-87] 87 (03/21 0516) Resp:  [16-18] 18 (03/21 0516) BP: (136-181)/(64-80) 136/64 (03/21 0516) SpO2:  [99 %-100 %] 100 % (03/21 0516) Last BM Date : 04/15/22 (per patient)  Intake/Output Summary (Last 24 hours) at 04/20/2022 0854 Last data filed at 04/20/2022 0347 Gross per 24 hour  Intake 2690.54 ml  Output --  Net 2690.54 ml    Intake/Output from previous day: 03/20 0701 - 03/21 0700 In: 2690.5  [P.O.:480; I.V.:1810.5; IV Piggyback:400] Out: -  Intake/Output this shift: No intake/output data recorded.   Physical Exam:  General: Alert female in NAD Heart:  Regular rate and rhythm.  Pulmonary: Normal respiratory effort Abdomen: Soft, nondistended, nontender. Normal bowel sounds. Extremities: No lower extremity edema  Neurologic: Alert and oriented Psych: Pleasant. Cooperative. Insight appears normal.    Principal Problem:   Biliary obstruction Active Problems:   Hypertension   Hypothyroid   Rectal cancer ypT3ypN1a (1/23 LN) s/p neoadj chemoXRT, robotic LAR resection & diverting loop ileostomy 12/21/2017   Post-ERCP acute pancreatitis     LOS: 3 days   Samantha Lutz ,NP 04/20/2022, 8:54 AM

## 2022-04-20 NOTE — Care Management Important Message (Signed)
Important Message  Patient Details IM Letter placed in room due to patient gone for MRI Name: Samantha Lutz MRN: [id number] Date of Birth: 23-Nov-1950   Medicare Important Message Given:  Yes     Kerin Salen 04/20/2022, 10:33 AM

## 2022-04-20 NOTE — Progress Notes (Addendum)
PROGRESS NOTE    BRYLIE MCALOON  [id number] DOB: [birth year] DOA: 04/17/2022 PCP: Andre Lefort, FNP  No chief complaint on file.   Brief Narrative:   Ms. Cale is Anner Baity 72 yo female with PMH rectal cancer with liver metastasis who presented with mild abdominal discomfort and outpatient elevated total bilirubin.  This has been trended and found to not be resolving.  MRI liver was obtained on 04/14/2022 which showed underlying hypoenhancing central liver mass measuring 2.7 x 1.9 cm compatible with solitary recurrent liver metastasis.  Also showed underlying associated high-grade malignant biliary hilar stricture.  She was admitted for GI evaluation and expedited endoscopy for further evaluation.  Assessment & Plan:   Principal Problem:   Biliary obstruction Active Problems:   Rectal cancer ypT3ypN1a (1/23 LN) s/p neoadj chemoXRT, robotic LAR resection & diverting loop ileostomy 12/21/2017   Hypertension   Hypothyroid   Post-ERCP acute pancreatitis  Biliary obstruction - Underwent MRI liver on 04/14/2022 showing high-grade malignant biliary hilar stricture - s/p ERCP 3/19 with localized biliary strictures found in the hepatitic duct system from extrahepatic mestastasis.  Strictures malignant appearing.  Treated with stent placement.  - unasyn, per GI  - MRCP with biliary stent extending from duodenum to vicinity of central mass near the confluence of the R and L hepatic ducts - mild right and L intrahepatic biliary dilatation indicates that the stent may possibly not be transversing the mass or maybe only partially transversing the mass - L hepatic lobe biliary dilatation is mildly improved but not resolved.   - rising LFT's (bili 19.1, AST 212, ALT 136) GI planning to remove biliary stent  Post ERCP Pancreatitis - continue IVF and pain management    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 Vein Thrombosis - noted on imaging, will discuss with radiology and GI  Rectal cancer ypT3ypN1a (1/23 LN)  s/p neoadj chemoXRT, robotic LAR resection & diverting loop ileostomy 12/21/2017 - Follows closely outpatient with oncology; has been needed with [facility]; cycle 2 placed on hold recently due to LFT's   Hypothyroid - Continue Synthroid   Hypertension - recently more elevated - will continue to monitor for now, consider adding meds (treat pain, discomfort first)  T8 Compression Fracture - noted, follow    DVT prophylaxis: lovenox Code Status: full Family Communication: none Disposition:   Status is: Inpatient Remains inpatient appropriate because: worsening LFT's, further GI recs pending   Consultants:  GI  Procedures:  ERCP  Antimicrobials:  Anti-infectives (From admission, onward)    Start     Dose/Rate Route Frequency Ordered Stop   04/18/22 1230  ampicillin-sulbactam (UNASYN) 1.5 g in sodium chloride 0.9 % 100 mL IVPB  Status:  Discontinued        1.5 g 200 mL/hr over 30 Minutes Intravenous  Once 04/18/22 1227 04/18/22 1531   04/18/22 1100  ampicillin-sulbactam (UNASYN) 1.5 g in sodium chloride 0.9 % 100 mL IVPB        1.5 g 200 mL/hr over 30 Minutes Intravenous Every 6 hours 04/18/22 0958         Subjective: Notes persistent abdominal pain  Objective: Vitals:   04/19/22 1337 04/19/22 2146 04/20/22 0516 04/20/22 1326  BP: (!) 181/80 (!) 169/79 136/64 (!) 157/66  Pulse: 79 76 87 95  Resp: 18 16 18 18   Temp: 98.3 F (36.8 C) 98.7 F (37.1 C) 98.3 F (36.8 C) 98.6 F (37 C)  TempSrc: Oral Oral Oral Oral  SpO2: 100% 99% 100% 98%  Weight:  Height:        Intake/Output Summary (Last 24 hours) at 04/20/2022 1538 Last data filed at 04/20/2022 0347 Gross per 24 hour  Intake 2210.54 ml  Output --  Net 2210.54 ml   Filed Weights   04/17/22 1702 04/18/22 1230  Weight: 91.3 kg 91.3 kg    Examination:  General: No acute distress. Cardiovascular: RRR Lungs: unlabored Abdomen: epigastric TTP Neurological: Alert and oriented 3. Moves all  extremities 4 with equal strength. Cranial nerves II through XII grossly intact. Extremities: No clubbing or cyanosis. No edema.  Data Reviewed: I have personally reviewed following labs and imaging studies  CBC: Recent Labs  Lab 04/17/22 1645 04/17/22 1934 04/18/22 0528 04/19/22 0500 04/20/22 0500  WBC 5.0 3.6* 2.4* 5.9 11.6*  NEUTROABS 3.6  --   --  5.1  --   HGB 10.7* 9.9* 9.3* 10.5* 11.6*  HCT 32.7* 30.2* 28.6* 31.9* 34.6*  MCV 102.5* 102.4* 102.9* 102.6* 100.6*  PLT 111* 109* 92* 106* 102*    Basic Metabolic Panel: Recent Labs  Lab 04/17/22 0841 04/17/22 1934 04/18/22 0528 04/19/22 0500 04/20/22 0500  NA 141  --  139 140 130*  K 3.7  --  3.5 4.3 4.1  CL 108  --  108 107 99  CO2 24  --  23 24 21*  GLUCOSE 101*  --  111* 130* 98  BUN 13  --  12 13 11   CREATININE 0.81 0.51 0.68 0.55 0.39*  CALCIUM 9.2  --  7.9* 8.8* 8.1*    GFR: Estimated Creatinine Clearance: 67.2 mL/min (Oronde Hallenbeck) (by C-G formula based on SCr of 0.39 mg/dL (L)).  Liver Function Tests: Recent Labs  Lab 04/17/22 0841 04/18/22 0528 04/19/22 0500 04/20/22 0500  AST 134* 140* 148* 212*  ALT 103* 93* 106* 136*  ALKPHOS 215* 168* 190* 223*  BILITOT 8.0* 7.8* 12.7* 19.1*  PROT 6.4* 5.5* 6.1* 6.0*  ALBUMIN 3.5 2.7* 2.8* 2.7*    CBG: No results for input(s): "GLUCAP" in the last 168 hours.   No results found for this or any previous visit (from the past 240 hour(s)).       Radiology Studies: MR ABDOMEN MRCP W WO CONTAST  Result Date: 04/20/2022 CLINICAL DATA:  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 Heavily T2-weighted images of the biliary and pancreatic ducts were obtained, and three-dimensional MRCP images were rendered by  post processing. CONTRAST:  61mL GADAVIST GADOBUTROL 1 MMOL/ML IV SOLN COMPARISON:  ERCP from 04/18/2022 and MRI abdomen through 04/14/2022 FINDINGS: Despite efforts by the technologist and patient, motion artifact is present on today's exam and could not be eliminated. This reduces exam sensitivity and specificity. Lower chest: Stable bilateral pulmonary nodules and right basilar atelectasis along with small right pleural effusion. Mild cardiomegaly. Uphill paraesophageal varices. Hepatobiliary: Dependent gallstones in the gallbladder. The biliary stent is difficult to visualize and is probably best seen on the axial postcontrast images extending from the duodenum up to the biliary tree to the confluence of the right and left hepatic ducts. The stent is not well visualized extending into either the right or left bile duct, and seems to terminate in the vicinity of Cailyn Houdek central mass which is near the confluence of the right and left hepatic ducts. This mass has indistinct borders complicated  by differences in the degree of enhancement of the left hepatic lobe in Bernerd Terhune right hepatic lobe, with delayed enhancement of the left hepatic lobe compared to the right, but is thought to measure at least about 4.0 by 1.7 cm on image 30 of series 17. There is mild right (particularly right posterior) and left intrahepatic biliary dilatation indicating that the stent may possibly not be fully traversing the mass, or may only be partially traversing the mass. The degree of left hepatic lobe biliary dilatation is mildly improved from 04/14/2022 but not resolved. No well-defined left portal vein enhancement, suspicious for left portal vein thrombosis. Pancreas: Mild pancreatic and peripancreatic edema, correlate with lipase levels to assess for pancreatitis. Spleen: The spleen measures 12.5 by 5.3 by 9.9 cm (volume = 340 cm^3). No significant focal splenic lesion. Adrenals/Urinary Tract:  Unremarkable Stomach/Bowel: Unremarkable  Vascular/Lymphatic: Varices along the gastric fundus. Suspicion for left portal vein thrombosis as noted in the liver section above. The right portal vein, main portal vein, splenic vein, and SMV appear patent. Esophageal and gastric varices indicative of portal venous hypertension. Abdominal aortic atherosclerosis. Other:  Trace fluid in the left paracolic gutter. Musculoskeletal: Suspected superior endplate compression fracture at T8, not readily visible on 01/25/2022, likely subacute. IMPRESSION: 1. The biliary stent appears to extend from the duodenum to the vicinity of Lief Palmatier central mass near the confluence of the right and left hepatic ducts. There is mild right and left intrahepatic biliary dilatation indicating that the stent may possibly not be fully traversing the mass, or may only be partially traversing the mass. The degree of left hepatic lobe biliary dilatation is mildly improved from 04/14/2022 but not resolved. 2. No well-defined enhancement in the left portal vein, suspicious for left portal vein thrombosis. 3. Mild pancreatic and peripancreatic edema, correlate with lipase levels to assess for pancreatitis. 4. Stable bilateral pulmonary nodules and right basilar atelectasis along with small right pleural effusion. Mild cardiomegaly. 5. Superior endplate compression fracture at T8, not readily visible on 01/25/2022, likely subacute. 6. Uphill paraesophageal and gastric varices indicating portal venous hypertension. 7. Trace fluid in the left paracolic gutter. 8. Cholelithiasis. Electronically Signed   By: Van Clines M.D.   On: 04/20/2022 09:59   MR 3D Recon At Scanner  Result Date: 04/20/2022 CLINICAL DATA:  Rectal cancer metastatic to the lungs and liver, ongoing chemotherapy, assessment of biliary obstruction EXAM: MRI ABDOMEN WITHOUT AND WITH CONTRAST (INCLUDING MRCP) TECHNIQUE: Multiplanar multisequence MR imaging of the abdomen was performed both before and after the administration of  intravenous contrast. Heavily T2-weighted images of the biliary and pancreatic ducts were obtained, and three-dimensional MRCP images were rendered by post processing. CONTRAST:  26mL GADAVIST GADOBUTROL 1 MMOL/ML IV SOLN COMPARISON:  ERCP from 04/18/2022 and MRI abdomen through 04/14/2022 FINDINGS: Despite efforts by the technologist and patient, motion artifact is present on today's exam and could not be eliminated. This reduces exam sensitivity and specificity. Lower chest: Stable bilateral pulmonary nodules and right basilar atelectasis along with small right pleural effusion. Mild cardiomegaly. Uphill paraesophageal varices. Hepatobiliary: Dependent gallstones in the gallbladder. The biliary stent is difficult to visualize and is probably best seen on the axial postcontrast images extending from the duodenum up to the biliary tree to the confluence of the right and left hepatic ducts. The stent is not well visualized extending into either the right or left bile duct, and seems to terminate in the vicinity of Delsa Walder central mass which is near the confluence of the  right and left hepatic ducts. This mass has indistinct borders complicated by differences in the degree of enhancement of the left hepatic lobe in Joban Colledge right hepatic lobe, with delayed enhancement of the left hepatic lobe compared to the right, but is thought to measure at least about 4.0 by 1.7 cm on image 30 of series 17. There is mild right (particularly right posterior) and left intrahepatic biliary dilatation indicating that the stent may possibly not be fully traversing the mass, or may only be partially traversing the mass. The degree of left hepatic lobe biliary dilatation is mildly improved from 04/14/2022 but not resolved. No well-defined left portal vein enhancement, suspicious for left portal vein thrombosis. Pancreas: Mild pancreatic and peripancreatic edema, correlate with lipase levels to assess for pancreatitis. Spleen: The spleen measures 12.5  by 5.3 by 9.9 cm (volume = 340 cm^3). No significant focal splenic lesion. Adrenals/Urinary Tract:  Unremarkable Stomach/Bowel: Unremarkable Vascular/Lymphatic: Varices along the gastric fundus. Suspicion for left portal vein thrombosis as noted in the liver section above. The right portal vein, main portal vein, splenic vein, and SMV appear patent. Esophageal and gastric varices indicative of portal venous hypertension. Abdominal aortic atherosclerosis. Other:  Trace fluid in the left paracolic gutter. Musculoskeletal: Suspected superior endplate compression fracture at T8, not readily visible on 01/25/2022, likely subacute. IMPRESSION: 1. The biliary stent appears to extend from the duodenum to the vicinity of Suann Klier central mass near the confluence of the right and left hepatic ducts. There is mild right and left intrahepatic biliary dilatation indicating that the stent may possibly not be fully traversing the mass, or may only be partially traversing the mass. The degree of left hepatic lobe biliary dilatation is mildly improved from 04/14/2022 but not resolved. 2. No well-defined enhancement in the left portal vein, suspicious for left portal vein thrombosis. 3. Mild pancreatic and peripancreatic edema, correlate with lipase levels to assess for pancreatitis. 4. Stable bilateral pulmonary nodules and right basilar atelectasis along with small right pleural effusion. Mild cardiomegaly. 5. Superior endplate compression fracture at T8, not readily visible on 01/25/2022, likely subacute. 6. Uphill paraesophageal and gastric varices indicating portal venous hypertension. 7. Trace fluid in the left paracolic gutter. 8. Cholelithiasis. Electronically Signed   By: Van Clines M.D.   On: 04/20/2022 09:59        Scheduled Meds:  Chlorhexidine Gluconate Cloth  6 each Topical Daily   diclofenac  100 mg Rectal Once   enoxaparin (LOVENOX) injection  50 mg Subcutaneous Q24H   feeding supplement  1 Container Oral  TID BM   levothyroxine  50 mcg Oral Daily   predniSONE  5 mg Oral Q breakfast   Continuous Infusions:  ampicillin-sulbactam (UNASYN) IV 1.5 g (04/20/22 1155)   lactated ringers 1,000 mL with potassium chloride 20 mEq infusion 100 mL/hr at 04/20/22 0347     LOS: 3 days    Time spent: over 30 min    Fayrene Helper, MD [facility]   To contact the attending provider between 7A-7P or the covering provider during after hours 7P-7A, please log into the web site [email or web address] and access using universal [place] password for that web site. If you do not have the password, please call the hospital operator.  04/20/2022, 3:38 PM

## 2022-04-20 NOTE — Progress Notes (Addendum)
Daily Progress Note  DOA: 04/17/2022 Hospital Day: 4 Chief Complaint: Jaundice   ASSESSMENT    72 yo female with the following:   Rectal cancer diagnosed in 2019 s/p chemotherapy / radiation and LAR and ileostomy. 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    Progressive jaundice secondary to liver mets / malignant biliary stricuture.   S/p ERCP 3/19 with findings of a malignancy appearing biliary stricture in hepatic duct from extrahepatic metastasis. Stent placed across stricture in left system . There were dilated ducts in the right system of unclear etiology so MRCP ordered to try and figure things out. Becoming more cholestatic with bilirubin up to 19 today.   WBC up slightly to 11.6. On Unasyn  Addendum: MRCP reviewed.  The biliary stent appears to extend from the duodenum to the vicinity of a central mass near the confluence of the right and left hepatic ducts. There is mild right and left intrahepatic biliary dilatation indicating that the stent may possibly not be fully traversing the mass, or may only be partially traversing the mass.   Patient will need EGD today to remove the biliary stent. The risks and benefits of EGD with possible biopsies were discussed with the patient who agrees to proceed.    Post-ERCP pancreatitis.  Hct 34% Cr 0.39 Getting LR at 100 ml /hr Upper abdominal pain has improved compared to yesterday   PLAN   Awaiting MRCP results  -------------------------------------------------------------------------------------    [facility] Attending   I have taken an interval history, reviewed the chart and examined the patient. I agree with the Advanced Practitioner's note, impression and recommendations.    Will remove biliary stent tomorrow Have consulted IR to help treat hilar obstruction  Gatha Mayer, MD, [facility] See Shea Evans on call - gastroenterology for best contact person 04/20/2022 5:37  PM   Subjective / New events:  Upper abdominal pain not resolved but is better compared to yesterday. No N/V. Tolerating clears   Lab Results: Recent Labs    04/18/22 0528 04/19/22 0500 04/20/22 0500  WBC 2.4* 5.9 11.6*  HGB 9.3* 10.5* 11.6*  HCT 28.6* 31.9* 34.6*  PLT 92* 106* 102*   BMET Recent Labs    04/18/22 0528 04/19/22 0500 04/20/22 0500  NA 139 140 130*  K 3.5 4.3 4.1  CL 108 107 99  CO2 23 24 21*  GLUCOSE 111* 130* 98  BUN 12 13 11   CREATININE 0.68 0.55 0.39*  CALCIUM 7.9* 8.8* 8.1*   LFT Recent Labs    04/20/22 0500  PROT 6.0*  ALBUMIN 2.7*  AST 212*  ALT 136*  ALKPHOS 223*  BILITOT 19.1*   PT/INR No results for input(s): "LABPROT", "INR" in the last 72 hours.   Scheduled inpatient medications:   Chlorhexidine Gluconate Cloth  6 each Topical Daily   diclofenac  100 mg Rectal Once   enoxaparin (LOVENOX) injection  50 mg Subcutaneous Q24H   feeding supplement  1 Container Oral TID BM   levothyroxine  50 mcg Oral Daily   predniSONE  5 mg Oral Q breakfast   Continuous inpatient infusions:   ampicillin-sulbactam (UNASYN) IV 1.5 g (04/20/22 0721)   lactated ringers 1,000 mL with potassium chloride 20 mEq infusion 100 mL/hr at 04/20/22 0347   PRN inpatient medications: acetaminophen **OR** acetaminophen, alum & mag hydroxide-simeth, ondansetron **OR** ondansetron (ZOFRAN) IV, oxyCODONE, prochlorperazine, traZODone  Vital signs in last 24 hours: Temp:  [98.3 F (36.8 C)-98.7 F (  37.1 C)] 98.3 F (36.8 C) (03/21 0516) Pulse Rate:  [76-87] 87 (03/21 0516) Resp:  [16-18] 18 (03/21 0516) BP: (136-181)/(64-80) 136/64 (03/21 0516) SpO2:  [99 %-100 %] 100 % (03/21 0516) Last BM Date : 04/15/22 (per patient)  Intake/Output Summary (Last 24 hours) at 04/20/2022 0854 Last data filed at 04/20/2022 0347 Gross per 24 hour  Intake 2690.54 ml  Output --  Net 2690.54 ml    Intake/Output from previous day: 03/20 0701 - 03/21 0700 In: 2690.5  [P.O.:480; I.V.:1810.5; IV Piggyback:400] Out: -  Intake/Output this shift: No intake/output data recorded.   Physical Exam:  General: Alert female in NAD Heart:  Regular rate and rhythm.  Pulmonary: Normal respiratory effort Abdomen: Soft, nondistended, nontender. Normal bowel sounds. Extremities: No lower extremity edema  Neurologic: Alert and oriented Psych: Pleasant. Cooperative. Insight appears normal.    Principal Problem:   Biliary obstruction Active Problems:   Hypertension   Hypothyroid   Rectal cancer ypT3ypN1a (1/23 LN) s/p neoadj chemoXRT, robotic LAR resection & diverting loop ileostomy 12/21/2017   Post-ERCP acute pancreatitis     LOS: 3 days   [name] ,NP 04/20/2022, 8:54 AM

## 2022-04-21 ENCOUNTER — Encounter (HOSPITAL_COMMUNITY): Payer: Self-pay | Admitting: Internal Medicine

## 2022-04-21 ENCOUNTER — Inpatient Hospital Stay (HOSPITAL_COMMUNITY): Payer: Medicare Other | Admitting: Anesthesiology

## 2022-04-21 ENCOUNTER — Encounter (HOSPITAL_COMMUNITY)
Admission: AD | Disposition: A | Discharge status: Home / Self Care | Payer: Self-pay | Source: Ambulatory Visit | Attending: Internal Medicine

## 2022-04-21 ENCOUNTER — Inpatient Hospital Stay (HOSPITAL_COMMUNITY): Payer: Medicare Other

## 2022-04-21 DIAGNOSIS — K831 Obstruction of bile duct: Secondary | ICD-10-CM | POA: Diagnosis not present

## 2022-04-21 DIAGNOSIS — I10 Essential (primary) hypertension: Secondary | ICD-10-CM | POA: Diagnosis not present

## 2022-04-21 DIAGNOSIS — T859XXA Unspecified complication of internal prosthetic device, implant and graft, initial encounter: Secondary | ICD-10-CM

## 2022-04-21 DIAGNOSIS — D638 Anemia in other chronic diseases classified elsewhere: Secondary | ICD-10-CM | POA: Diagnosis not present

## 2022-04-21 DIAGNOSIS — J449 Chronic obstructive pulmonary disease, unspecified: Secondary | ICD-10-CM | POA: Diagnosis not present

## 2022-04-21 DIAGNOSIS — T859XXD Unspecified complication of internal prosthetic device, implant and graft, subsequent encounter: Secondary | ICD-10-CM

## 2022-04-21 DIAGNOSIS — T183XXA Foreign body in small intestine, initial encounter: Secondary | ICD-10-CM

## 2022-04-21 HISTORY — PX: STENT REMOVAL: SHX6421

## 2022-04-21 HISTORY — PX: ESOPHAGOGASTRODUODENOSCOPY (EGD) WITH PROPOFOL: SHX5813

## 2022-04-21 HISTORY — PX: IR PERCUTANEOUS TRANSHEPATIC CHOLANGIOGRAM: IMG6042

## 2022-04-21 LAB — CBC WITH DIFFERENTIAL/PLATELET
Abs Immature Granulocytes: 1.47 10*3/uL — ABNORMAL HIGH (ref 0.00–0.07)
Basophils Absolute: 0 10*3/uL (ref 0.0–0.1)
Basophils Relative: 0 %
Eosinophils Absolute: 0 10*3/uL (ref 0.0–0.5)
Eosinophils Relative: 0 %
HCT: 31.3 % — ABNORMAL LOW (ref 36.0–46.0)
Hemoglobin: 10.5 g/dL — ABNORMAL LOW (ref 12.0–15.0)
Immature Granulocytes: 8 %
Lymphocytes Relative: 2 %
Lymphs Abs: 0.4 10*3/uL — ABNORMAL LOW (ref 0.7–4.0)
MCH: 34.1 pg — ABNORMAL HIGH (ref 26.0–34.0)
MCHC: 33.5 g/dL (ref 30.0–36.0)
MCV: 101.6 fL — ABNORMAL HIGH (ref 80.0–100.0)
Monocytes Absolute: 1.6 10*3/uL — ABNORMAL HIGH (ref 0.1–1.0)
Monocytes Relative: 9 %
Neutro Abs: 14.2 10*3/uL — ABNORMAL HIGH (ref 1.7–7.7)
Neutrophils Relative %: 81 %
Platelets: 85 10*3/uL — ABNORMAL LOW (ref 150–400)
RBC: 3.08 MIL/uL — ABNORMAL LOW (ref 3.87–5.11)
RDW: 19.6 % — ABNORMAL HIGH (ref 11.5–15.5)
WBC: 17.7 10*3/uL — ABNORMAL HIGH (ref 4.0–10.5)
nRBC: 0 % (ref 0.0–0.2)

## 2022-04-21 LAB — BASIC METABOLIC PANEL
Anion gap: 9 (ref 5–15)
BUN: 18 mg/dL (ref 8–23)
CO2: 21 mmol/L — ABNORMAL LOW (ref 22–32)
Calcium: 7.8 mg/dL — ABNORMAL LOW (ref 8.9–10.3)
Chloride: 99 mmol/L (ref 98–111)
Creatinine, Ser: 0.68 mg/dL (ref 0.44–1.00)
GFR, Estimated: >60 mL/min (ref >60)
Glucose, Bld: 106 mg/dL — ABNORMAL HIGH (ref 70–99)
Potassium: 4.1 mmol/L (ref 3.5–5.1)
Sodium: 129 mmol/L — ABNORMAL LOW (ref 135–145)

## 2022-04-21 LAB — PHOSPHORUS: Phosphorus: 4.2 mg/dL (ref 2.5–4.6)

## 2022-04-21 LAB — HEPATIC FUNCTION PANEL
ALT: 111 U/L — ABNORMAL HIGH (ref 0–44)
AST: 148 U/L — ABNORMAL HIGH (ref 15–41)
Albumin: 2.4 g/dL — ABNORMAL LOW (ref 3.5–5.0)
Alkaline Phosphatase: 168 U/L — ABNORMAL HIGH (ref 38–126)
Bilirubin, Direct: 13.6 mg/dL — ABNORMAL HIGH (ref 0.0–0.2)
Indirect Bilirubin: 7.1 mg/dL — ABNORMAL HIGH (ref 0.3–0.9)
Total Bilirubin: 20.7 mg/dL (ref 0.3–1.2)
Total Protein: 5.5 g/dL — ABNORMAL LOW (ref 6.5–8.1)

## 2022-04-21 LAB — MAGNESIUM: Magnesium: 1.8 mg/dL (ref 1.7–2.4)

## 2022-04-21 LAB — LIPASE, BLOOD: Lipase: 162 U/L — ABNORMAL HIGH (ref 11–51)

## 2022-04-21 LAB — BRAIN NATRIURETIC PEPTIDE: B Natriuretic Peptide: 164.9 pg/mL — ABNORMAL HIGH (ref 0.0–100.0)

## 2022-04-21 SURGERY — ESOPHAGOGASTRODUODENOSCOPY (EGD) WITH PROPOFOL
Anesthesia: Monitor Anesthesia Care

## 2022-04-21 MED ORDER — FENTANYL CITRATE (PF) 100 MCG/2ML IJ SOLN
INTRAMUSCULAR | Status: AC | PRN
  Administered 2022-04-21: 25 ug via INTRAVENOUS
  Administered 2022-04-21: 50 ug via INTRAVENOUS
  Administered 2022-04-21 (×3): 25 ug via INTRAVENOUS
  Administered 2022-04-21: 50 ug via INTRAVENOUS

## 2022-04-21 MED ORDER — LIDOCAINE HCL 1 % IJ SOLN
INTRAMUSCULAR | Status: AC
  Administered 2022-04-21: 20 mL
  Filled 2022-04-21: qty 20

## 2022-04-21 MED ORDER — IOHEXOL 300 MG/ML  SOLN
50.0000 mL | Freq: Once | INTRAMUSCULAR | Status: AC | PRN
  Administered 2022-04-21: 50 mL

## 2022-04-21 MED ORDER — PROPOFOL 500 MG/50ML IV EMUL
INTRAVENOUS | Status: DC | PRN
  Administered 2022-04-21: 130 ug/kg/min via INTRAVENOUS

## 2022-04-21 MED ORDER — LIDOCAINE 2% (20 MG/ML) 5 ML SYRINGE
INTRAMUSCULAR | Status: DC | PRN
  Administered 2022-04-21: 100 mg via INTRAVENOUS

## 2022-04-21 MED ORDER — FENTANYL CITRATE (PF) 100 MCG/2ML IJ SOLN
INTRAMUSCULAR | Status: AC
  Filled 2022-04-21: qty 2

## 2022-04-21 MED ORDER — LIDOCAINE-EPINEPHRINE 1 %-1:100000 IJ SOLN
INTRAMUSCULAR | Status: AC
  Administered 2022-04-21: 4 mL via INTRADERMAL
  Filled 2022-04-21: qty 1

## 2022-04-21 MED ORDER — PROPOFOL 10 MG/ML IV BOLUS
INTRAVENOUS | Status: DC | PRN
  Administered 2022-04-21: 20 mg via INTRAVENOUS
  Administered 2022-04-21: 10 mg via INTRAVENOUS
  Administered 2022-04-21: 20 mg via INTRAVENOUS

## 2022-04-21 MED ORDER — IOHEXOL 300 MG/ML  SOLN
50.0000 mL | Freq: Once | INTRAMUSCULAR | Status: AC | PRN
  Administered 2022-04-21: 10 mL

## 2022-04-21 MED ORDER — LIDOCAINE HCL 1 % IJ SOLN
INTRAMUSCULAR | Status: AC
  Administered 2022-04-21: 20 mL via INTRADERMAL
  Filled 2022-04-21: qty 20

## 2022-04-21 MED ORDER — MIDAZOLAM HCL 2 MG/2ML IJ SOLN
INTRAMUSCULAR | Status: AC
  Filled 2022-04-21: qty 2

## 2022-04-21 MED ORDER — PROPOFOL 500 MG/50ML IV EMUL
INTRAVENOUS | Status: AC
  Filled 2022-04-21: qty 50

## 2022-04-21 MED ORDER — MIDAZOLAM HCL 2 MG/2ML IJ SOLN
INTRAMUSCULAR | Status: AC | PRN
  Administered 2022-04-21 (×2): .5 mg via INTRAVENOUS
  Administered 2022-04-21: 1 mg via INTRAVENOUS
  Administered 2022-04-21: .5 mg via INTRAVENOUS
  Administered 2022-04-21: 1 mg via INTRAVENOUS
  Administered 2022-04-21: .5 mg via INTRAVENOUS

## 2022-04-21 SURGICAL SUPPLY — 13 items
BLOCK BITE 60FR ADLT L/F BLUE (MISCELLANEOUS) ×2
ELECT REM PT RETURN 9FT ADLT (ELECTROSURGICAL)
FORCEP RJ3 GP 1.8X160 W-NEEDLE (CUTTING FORCEPS)
FORCEPS BIOP RAD 4 LRG CAP 4 (CUTTING FORCEPS)
NEEDLE SCLEROTHERAPY 25GX240 (NEEDLE)
PROBE APC STR FIRE (PROBE)
PROBE INJECTION GOLD (MISCELLANEOUS)
PROBE INJECTION GOLD 7FR (MISCELLANEOUS)
SNARE SHORT THROW 13M SML OVAL (MISCELLANEOUS)
SYR 50ML LL SCALE MARK (SYRINGE)
TUBING ENDO SMARTCAP PENTAX (MISCELLANEOUS) ×4
TUBING IRRIGATION ENDOGATOR (MISCELLANEOUS) ×2
WATER STERILE IRR 1000ML POUR (IV SOLUTION)

## 2022-04-21 NOTE — Op Note (Signed)
[facility] Patient Name: Samantha Lutz Procedure Date: 04/21/2022 MRN: [id number] Attending MD: Gatha Mayer , MD, [id number] Date of Birth: 21-Jul-1950 CSN: [id number] Age: 72 Admit Type: Inpatient Procedure:                Upper GI endoscopy Indications:              Foreign body in the small bowel - problematic                            biliary stent - not treating biliary obstruction                            from hilar tumor Providers:                Gatha Mayer, MD, Vista Lawman, RN, Cherylynn Ridges, Technician Referring MD:             Izola Price. Sherrill Medicines:                Monitored Anesthesia Care Complications:            No immediate complications. Estimated Blood Loss:     Estimated blood loss: none. Procedure:                Pre-Anesthesia Assessment:                           - Prior to the procedure, a History and Physical                            was performed, and patient medications and                            allergies were reviewed. The patient's tolerance of                            previous anesthesia was also reviewed. The risks                            and benefits of the procedure and the sedation                            options and risks were discussed with the patient.                            All questions were answered, and informed consent                            was obtained. Prior Anticoagulants: The patient                            last took Lovenox (enoxaparin) 2 days prior to the  procedure. ASA Grade Assessment: III - A patient                            with severe systemic disease. After reviewing the                            risks and benefits, the patient was deemed in                            satisfactory condition to undergo the procedure.                           After obtaining informed consent, the endoscope was                             passed under direct vision. Throughout the                            procedure, the patient's blood pressure, pulse, and                            oxygen saturations were monitored continuously. The                            [id number] Olympus endoscope was introduced                            through the mouth, and advanced to the second part                            of duodenum. The upper GI endoscopy was                            accomplished without difficulty. The patient                            tolerated the procedure well. Scope In: Scope Out: Findings:      A foreign body was found at the major papilla. biliary stent placed       earlier this wee. Removal of the stent was accomplished with a snare and       fluoroscopy guidance - entire stent removed - confirmed by fluoro and       retrieval      The exam was otherwise without abnormality. Impression:               - Duodenal foreign body. biliary stent. Removal was                            successful.                           - The examination was otherwise normal. Moderate Sedation:      Not Applicable - Patient had care per Anesthesia. Recommendation:           - Patient to go to  IR for percutaneous biliary drain Procedure Code(s):        --- Professional ---                           [phone], Esophagogastroduodenoscopy, flexible,                            transoral; with removal of foreign body(s) Diagnosis Code(s):        --- Professional ---                           [id number], Foreign body in small intestine, initial                            encounter CPT copyright 2022 [employer]. All rights reserved. The codes documented in this report are preliminary and upon coder review may  be revised to meet current compliance requirements. Gatha Mayer, MD 04/21/2022 1:18:52 PM This report has been signed electronically. Number of Addenda: 0

## 2022-04-21 NOTE — Procedures (Signed)
Interventional Radiology Procedure Note  Procedure: Right hepatic external drain placement  Indication: Biliary obstruction  Findings: Please refer to procedural dictation for full description.  Complications: None  EBL: < 10 mL  Miachel Roux, MD [phone]

## 2022-04-21 NOTE — Progress Notes (Signed)
PROGRESS NOTE    Samantha Lutz  [id number] DOB: 1950-02-02 DOA: 04/17/2022 PCP: Andre Lefort, FNP  No chief complaint on file.   Brief Narrative:   Ms. Irigoyen is Samantha Lutz 72 yo female with PMH rectal cancer with liver metastasis who presented with mild abdominal discomfort and outpatient elevated total bilirubin.  This has been trended and found to not be resolving.  MRI liver was obtained on 04/14/2022 which showed underlying hypoenhancing central liver mass measuring 2.7 x 1.9 cm compatible with solitary recurrent liver metastasis.  Also showed underlying associated high-grade malignant biliary hilar stricture.  She was admitted for GI evaluation and expedited endoscopy for further evaluation.  Assessment & Plan:   Principal Problem:   Biliary obstruction Active Problems:   Rectal cancer ypT3ypN1a (1/23 LN) s/p neoadj chemoXRT, robotic LAR resection & diverting loop ileostomy 12/21/2017   Hypertension   Hypothyroid   Post-ERCP acute pancreatitis   Disorder of bile duct stent  Biliary obstruction - Underwent MRI liver on 04/14/2022 showing high-grade malignant biliary hilar stricture - s/p ERCP 3/19 with localized biliary strictures found in the hepatitic duct system from extrahepatic mestastasis.  Strictures malignant appearing.  Treated with stent placement.  - unasyn, per GI  - MRCP with biliary stent extending from duodenum to vicinity of central mass near the confluence of the R and L hepatic ducts - mild right and L intrahepatic biliary dilatation indicates that the stent may possibly not be transversing the mass or maybe only partially transversing the mass - L hepatic lobe biliary dilatation is mildly improved but not resolved.   - bili rising today, AST/ALT/alk phos mild downtrend - removal of biliary stent 3/22 - plan for IR for perc biliary drain  Post ERCP Pancreatitis - seems improved, will follow    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 Vein Thrombosis - no need for  anticoagulation per discussion with oncology  Leukocytosis  Fever - due to above, continue abx - last fever 3/21 PM  Hyponatremia - hold LR - will continue to monitor  Rectal cancer ypT3ypN1a (1/23 LN) s/p neoadj chemoXRT, robotic LAR resection & diverting loop ileostomy 12/21/2017 - Follows closely outpatient with oncology; has been needed with [facility]; cycle 2 placed on hold recently due to LFT's   Hypothyroid - Continue Synthroid   Hypertension - recently more elevated - will continue to monitor for now, consider adding meds (treat pain, discomfort first)  T8 Compression Fracture - noted, follow    DVT prophylaxis: lovenox Code Status: full Family Communication: none Disposition:   Status is: Inpatient Remains inpatient appropriate because: worsening LFT's, further GI recs pending   Consultants:  GI  Procedures:  ERCP  Antimicrobials:  Anti-infectives (From admission, onward)    Start     Dose/Rate Route Frequency Ordered Stop   04/18/22 1230  ampicillin-sulbactam (UNASYN) 1.5 g in sodium chloride 0.9 % 100 mL IVPB  Status:  Discontinued        1.5 g 200 mL/hr over 30 Minutes Intravenous  Once 04/18/22 1227 04/18/22 1531   04/18/22 1100  ampicillin-sulbactam (UNASYN) 1.5 g in sodium chloride 0.9 % 100 mL IVPB        1.5 g 200 mL/hr over 30 Minutes Intravenous Every 6 hours 04/18/22 0958         Subjective: Pain improved today  Objective: Vitals:   04/21/22 1316 04/21/22 1320 04/21/22 1330 04/21/22 1352  BP: (!) 133/53 (!) 121/38 (!) 142/55 130/65  Pulse: (!) 102 100 95 93  Resp: (!)  25 (!) 23 (!) 22 18  Temp: 98.3 F (36.8 C)   98.7 F (37.1 C)  TempSrc: Temporal   Oral  SpO2: 100% 99% 96% 97%  Weight:      Height:        Intake/Output Summary (Last 24 hours) at 04/21/2022 1358 Last data filed at 04/21/2022 1313 Gross per 24 hour  Intake 1716.78 ml  Output --  Net 1716.78 ml   Filed Weights   04/17/22 1702 04/18/22 1230  Weight: 91.3  kg 91.3 kg    Examination:  General: No acute distress. Cardiovascular: RRR Lungs: unlabored Abdomen: mild epigastric TTP  Neurological: Alert and oriented 3. Moves all extremities 4 with equal strength. Cranial nerves II through XII grossly intact. Extremities: No clubbing or cyanosis. No edema.   Data Reviewed: I have personally reviewed following labs and imaging studies  CBC: Recent Labs  Lab 04/17/22 1645 04/17/22 1934 04/18/22 0528 04/19/22 0500 04/20/22 0500 04/21/22 0542  WBC 5.0 3.6* 2.4* 5.9 11.6* 17.7*  NEUTROABS 3.6  --   --  5.1  --  14.2*  HGB 10.7* 9.9* 9.3* 10.5* 11.6* 10.5*  HCT 32.7* 30.2* 28.6* 31.9* 34.6* 31.3*  MCV 102.5* 102.4* 102.9* 102.6* 100.6* 101.6*  PLT 111* 109* 92* 106* 102* 85*    Basic Metabolic Panel: Recent Labs  Lab 04/17/22 0841 04/17/22 1934 04/18/22 0528 04/19/22 0500 04/20/22 0500 04/21/22 0542  NA 141  --  139 140 130* 129*  K 3.7  --  3.5 4.3 4.1 4.1  CL 108  --  108 107 99 99  CO2 24  --  23 24 21* 21*  GLUCOSE 101*  --  111* 130* 98 106*  BUN 13  --  12 13 11 18   CREATININE 0.81 0.51 0.68 0.55 0.39* 0.68  CALCIUM 9.2  --  7.9* 8.8* 8.1* 7.8*  MG  --   --   --   --   --  1.8  PHOS  --   --   --   --   --  4.2    GFR: Estimated Creatinine Clearance: 67.2 mL/min (by C-G formula based on SCr of 0.68 mg/dL).  Liver Function Tests: Recent Labs  Lab 04/17/22 0841 04/18/22 0528 04/19/22 0500 04/20/22 0500 04/21/22 0542  AST 134* 140* 148* 212* 148*  ALT 103* 93* 106* 136* 111*  ALKPHOS 215* 168* 190* 223* 168*  BILITOT 8.0* 7.8* 12.7* 19.1* 20.7*  PROT 6.4* 5.5* 6.1* 6.0* 5.5*  ALBUMIN 3.5 2.7* 2.8* 2.7* 2.4*    CBG: No results for input(s): "GLUCAP" in the last 168 hours.   No results found for this or any previous visit (from the past 240 hour(s)).       Radiology Studies: DG C-Arm 1-60 Min-No Report  Result Date: 04/21/2022 Fluoroscopy was utilized by the requesting physician.  No radiographic  interpretation.   MR ABDOMEN MRCP W WO CONTAST  Result Date: 04/20/2022 CLINICAL DATA:  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 Heavily T2-weighted images of the biliary and pancreatic ducts were obtained, and three-dimensional MRCP images were rendered by post processing. CONTRAST:  46mL GADAVIST GADOBUTROL 1 MMOL/ML IV SOLN COMPARISON:  ERCP from 04/18/2022 and MRI abdomen through 04/14/2022 FINDINGS: Despite efforts by the technologist and patient, motion artifact is  present on today's exam and could not be eliminated. This reduces exam sensitivity and specificity. Lower chest: Stable bilateral pulmonary nodules and right basilar atelectasis along with small right pleural effusion. Mild cardiomegaly. Uphill paraesophageal varices. Hepatobiliary: Dependent gallstones in the gallbladder. The biliary stent is difficult to visualize and is probably best seen on the axial postcontrast images extending from the duodenum up to the biliary tree to the confluence of the right and left hepatic ducts. The stent is not well visualized extending into either the right or left bile duct, and seems to terminate in the vicinity of Teagon Kron central mass which is near the confluence of the right and left hepatic ducts. This mass has indistinct borders complicated by differences in the degree of enhancement of the left hepatic lobe in Kamika Goodloe right hepatic lobe, with delayed enhancement of the left hepatic lobe compared to the right, but is thought to measure at least about 4.0 by 1.7 cm on image 30 of series 17. There is mild right (particularly right posterior) and left intrahepatic biliary dilatation indicating that the stent may possibly not be fully traversing the mass, or  may only be partially traversing the mass. The degree of left hepatic lobe biliary dilatation is mildly improved from 04/14/2022 but not resolved. No well-defined left portal vein enhancement, suspicious for left portal vein thrombosis. Pancreas: Mild pancreatic and peripancreatic edema, correlate with lipase levels to assess for pancreatitis. Spleen: The spleen measures 12.5 by 5.3 by 9.9 cm (volume = 340 cm^3). No significant focal splenic lesion. Adrenals/Urinary Tract:  Unremarkable Stomach/Bowel: Unremarkable Vascular/Lymphatic: Varices along the gastric fundus. Suspicion for left portal vein thrombosis as noted in the liver section above. The right portal vein, main portal vein, splenic vein, and SMV appear patent. Esophageal and gastric varices indicative of portal venous hypertension. Abdominal aortic atherosclerosis. Other:  Trace fluid in the left paracolic gutter. Musculoskeletal: Suspected superior endplate compression fracture at T8, not readily visible on 01/25/2022, likely subacute. IMPRESSION: 1. The biliary stent appears to extend from the duodenum to the vicinity of Hiroto Saltzman central mass near the confluence of the right and left hepatic ducts. There is mild right and left intrahepatic biliary dilatation indicating that the stent may possibly not be fully traversing the mass, or may only be partially traversing the mass. The degree of left hepatic lobe biliary dilatation is mildly improved from 04/14/2022 but not resolved. 2. No well-defined enhancement in the left portal vein, suspicious for left portal vein thrombosis. 3. Mild pancreatic and peripancreatic edema, correlate with lipase levels to assess for pancreatitis. 4. Stable bilateral pulmonary nodules and right basilar atelectasis along with small right pleural effusion. Mild cardiomegaly. 5. Superior endplate compression fracture at T8, not readily visible on 01/25/2022, likely subacute. 6. Uphill paraesophageal and gastric varices indicating  portal venous hypertension. 7. Trace fluid in the left paracolic gutter. 8. Cholelithiasis. Electronically Signed   By: Van Clines M.D.   On: 04/20/2022 09:59   MR 3D Recon At Scanner  Result Date: 04/20/2022 CLINICAL DATA:  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 Heavily T2-weighted images of the biliary and pancreatic ducts were obtained, and three-dimensional MRCP images were rendered by post processing. CONTRAST:  39mL GADAVIST GADOBUTROL 1 MMOL/ML IV SOLN COMPARISON:  ERCP from 04/18/2022 and MRI abdomen through 04/14/2022  FINDINGS: Despite efforts by the technologist and patient, motion artifact is present on today's exam and could not be eliminated. This reduces exam sensitivity and specificity. Lower chest: Stable bilateral pulmonary nodules and right basilar atelectasis along with small right pleural effusion. Mild cardiomegaly. Uphill paraesophageal varices. Hepatobiliary: Dependent gallstones in the gallbladder. The biliary stent is difficult to visualize and is probably best seen on the axial postcontrast images extending from the duodenum up to the biliary tree to the confluence of the right and left hepatic ducts. The stent is not well visualized extending into either the right or left bile duct, and seems to terminate in the vicinity of Adalae Baysinger central mass which is near the confluence of the right and left hepatic ducts. This mass has indistinct borders complicated by differences in the degree of enhancement of the left hepatic lobe in Lean Fayson right hepatic lobe, with delayed enhancement of the left hepatic lobe compared to the right, but is thought to measure at least about 4.0 by 1.7 cm on image 30 of series 17. There is mild right  (particularly right posterior) and left intrahepatic biliary dilatation indicating that the stent may possibly not be fully traversing the mass, or may only be partially traversing the mass. The degree of left hepatic lobe biliary dilatation is mildly improved from 04/14/2022 but not resolved. No well-defined left portal vein enhancement, suspicious for left portal vein thrombosis. Pancreas: Mild pancreatic and peripancreatic edema, correlate with lipase levels to assess for pancreatitis. Spleen: The spleen measures 12.5 by 5.3 by 9.9 cm (volume = 340 cm^3). No significant focal splenic lesion. Adrenals/Urinary Tract:  Unremarkable Stomach/Bowel: Unremarkable Vascular/Lymphatic: Varices along the gastric fundus. Suspicion for left portal vein thrombosis as noted in the liver section above. The right portal vein, main portal vein, splenic vein, and SMV appear patent. Esophageal and gastric varices indicative of portal venous hypertension. Abdominal aortic atherosclerosis. Other:  Trace fluid in the left paracolic gutter. Musculoskeletal: Suspected superior endplate compression fracture at T8, not readily visible on 01/25/2022, likely subacute. IMPRESSION: 1. The biliary stent appears to extend from the duodenum to the vicinity of Lori-Ann Lindfors central mass near the confluence of the right and left hepatic ducts. There is mild right and left intrahepatic biliary dilatation indicating that the stent may possibly not be fully traversing the mass, or may only be partially traversing the mass. The degree of left hepatic lobe biliary dilatation is mildly improved from 04/14/2022 but not resolved. 2. No well-defined enhancement in the left portal vein, suspicious for left portal vein thrombosis. 3. Mild pancreatic and peripancreatic edema, correlate with lipase levels to assess for pancreatitis. 4. Stable bilateral pulmonary nodules and right basilar atelectasis along with small right pleural effusion. Mild cardiomegaly. 5. Superior  endplate compression fracture at T8, not readily visible on 01/25/2022, likely subacute. 6. Uphill paraesophageal and gastric varices indicating portal venous hypertension. 7. Trace fluid in the left paracolic gutter. 8. Cholelithiasis. Electronically Signed   By: Van Clines M.D.   On: 04/20/2022 09:59        Scheduled Meds:  Chlorhexidine Gluconate Cloth  6 each Topical Daily   feeding supplement  1 Container Oral TID BM   levothyroxine  50 mcg Oral Daily   predniSONE  5 mg Oral Q breakfast   Continuous Infusions:  ampicillin-sulbactam (UNASYN) IV 1.5 g (04/21/22 0542)   lactated ringers 125 mL/hr at 04/21/22 1252     LOS: 4 days    Time spent: over 30 min  Fayrene Helper, MD [facility]   To contact the attending provider between 7A-7P or the covering provider during after hours 7P-7A, please log into the web site [email or web address] and access using universal [place] password for that web site. If you do not have the password, please call the hospital operator.  04/21/2022, 1:58 PM

## 2022-04-21 NOTE — Progress Notes (Signed)
IP PROGRESS NOTE  Subjective:   Samantha Lutz was admitted on 04/17/2022 with obstructive jaundice.  She underwent an ERCP on 04/18/2022 with placement of a bile duct stent.  Obstruction was noted in the central liver.  She reports upper abdominal pain since hospital admission.  The pain is better this morning.  She has dark urine.  Objective: Vital signs in last 24 hours: Blood pressure (!) 137/53, pulse 95, temperature 98.5 F (36.9 C), temperature source Oral, resp. rate 16, height 5\' 3"  (1.6 m), weight 201 lb 4.5 oz (91.3 kg), SpO2 97 %.  Intake/Output from previous day: 03/21 0701 - 03/22 0700 In: 1516.8 [I.V.:1116.8; IV Piggyback:400] Out: -   Physical Exam:  HEENT: No thrush, ecchymosis at the left palate scleral icterus Lungs: Decreased breath sounds at the right posterior chest, no respiratory distress Cardiac: Regular rate and rhythm Abdomen: Soft, no hepatomegaly, tender in the mid and right upper abdomen Extremities: No leg edema Skin: Jaundice, ecchymoses over the arms and around the eyes  Portacath/PICC-without erythema  Lab Results: Recent Labs    04/20/22 0500 04/21/22 0542  WBC 11.6* 17.7*  HGB 11.6* 10.5*  HCT 34.6* 31.3*  PLT 102* 85*    BMET Recent Labs    04/19/22 0500 04/20/22 0500  NA 140 130*  K 4.3 4.1  CL 107 99  CO2 24 21*  GLUCOSE 130* 98  BUN 13 11  CREATININE 0.55 0.39*  CALCIUM 8.8* 8.1*    Lab Results  Component Value Date   CEA1 <1.00 06/01/2020   CEA <1.00 06/01/2020    Studies/Results: MR ABDOMEN MRCP W WO CONTAST  Result Date: 04/20/2022 CLINICAL DATA:  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 Heavily T2-weighted images of the biliary and pancreatic ducts  were obtained, and three-dimensional MRCP images were rendered by post processing. CONTRAST:  69mL GADAVIST GADOBUTROL 1 MMOL/ML IV SOLN COMPARISON:  ERCP from 04/18/2022 and MRI abdomen through 04/14/2022 FINDINGS: Despite efforts by the technologist and patient, motion artifact is present on today's exam and could not be eliminated. This reduces exam sensitivity and specificity. Lower chest: Stable bilateral pulmonary nodules and right basilar atelectasis along with small right pleural effusion. Mild cardiomegaly. Uphill paraesophageal varices. Hepatobiliary: Dependent gallstones in the gallbladder. The biliary stent is difficult to visualize and is probably best seen on the axial postcontrast images extending from the duodenum up to the biliary tree to the confluence of the right and left hepatic ducts. The stent is not well visualized extending into either the right or left bile duct, and seems to terminate in the vicinity of a central mass which is near the confluence of the right and left hepatic ducts. This mass has indistinct borders complicated by differences in the degree of enhancement of the left hepatic lobe in a right hepatic lobe, with delayed enhancement of the left hepatic lobe compared to the right, but is thought to measure at least about 4.0 by 1.7 cm on image 30 of series 17. There is mild right (particularly right posterior) and left intrahepatic biliary dilatation indicating that the stent may possibly not be fully traversing the mass, or may only be partially traversing the mass. The degree of left hepatic lobe biliary dilatation is mildly improved from 04/14/2022 but not resolved. No well-defined left portal  vein enhancement, suspicious for left portal vein thrombosis. Pancreas: Mild pancreatic and peripancreatic edema, correlate with lipase levels to assess for pancreatitis. Spleen: The spleen measures 12.5 by 5.3 by 9.9 cm (volume = 340 cm^3). No significant focal splenic lesion.  Adrenals/Urinary Tract:  Unremarkable Stomach/Bowel: Unremarkable Vascular/Lymphatic: Varices along the gastric fundus. Suspicion for left portal vein thrombosis as noted in the liver section above. The right portal vein, main portal vein, splenic vein, and SMV appear patent. Esophageal and gastric varices indicative of portal venous hypertension. Abdominal aortic atherosclerosis. Other:  Trace fluid in the left paracolic gutter. Musculoskeletal: Suspected superior endplate compression fracture at T8, not readily visible on 01/25/2022, likely subacute. IMPRESSION: 1. The biliary stent appears to extend from the duodenum to the vicinity of a central mass near the confluence of the right and left hepatic ducts. There is mild right and left intrahepatic biliary dilatation indicating that the stent may possibly not be fully traversing the mass, or may only be partially traversing the mass. The degree of left hepatic lobe biliary dilatation is mildly improved from 04/14/2022 but not resolved. 2. No well-defined enhancement in the left portal vein, suspicious for left portal vein thrombosis. 3. Mild pancreatic and peripancreatic edema, correlate with lipase levels to assess for pancreatitis. 4. Stable bilateral pulmonary nodules and right basilar atelectasis along with small right pleural effusion. Mild cardiomegaly. 5. Superior endplate compression fracture at T8, not readily visible on 01/25/2022, likely subacute. 6. Uphill paraesophageal and gastric varices indicating portal venous hypertension. 7. Trace fluid in the left paracolic gutter. 8. Cholelithiasis. Electronically Signed   By: Van Clines M.D.   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 Heavily T2-weighted images of the biliary and pancreatic ducts were obtained, and three-dimensional MRCP images were rendered by post processing. CONTRAST:  73mL GADAVIST GADOBUTROL 1 MMOL/ML IV SOLN COMPARISON:  ERCP from 04/18/2022 and MRI abdomen through 04/14/2022 FINDINGS: Despite efforts by the technologist and patient, motion artifact is present on today's exam and could not be eliminated. This reduces exam sensitivity and specificity. Lower chest: Stable bilateral pulmonary nodules and right basilar atelectasis along with small right pleural effusion. Mild cardiomegaly. Uphill paraesophageal varices. Hepatobiliary: Dependent gallstones in the gallbladder. The biliary stent is difficult to visualize and is probably best seen on the axial postcontrast images extending from the duodenum up to the biliary tree to the confluence of the right and left hepatic ducts. The stent is not well visualized extending into either the right or left bile duct, and seems to terminate in the vicinity of a central mass which is near the confluence of the right and left hepatic ducts. This mass has indistinct borders complicated by differences in the degree of enhancement of the left hepatic lobe in a right hepatic lobe, with delayed enhancement of the left hepatic lobe compared to the right, but is thought to measure at least about 4.0 by 1.7 cm on image 30 of series 17. There is mild right (particularly right posterior) and left intrahepatic biliary dilatation indicating that the stent may possibly not be fully traversing the mass, or may only be partially traversing the mass. The degree of left hepatic lobe biliary dilatation is  mildly improved from 04/14/2022 but not resolved. No well-defined left portal vein enhancement, suspicious for left portal vein thrombosis. Pancreas: Mild pancreatic and peripancreatic edema, correlate with lipase  levels to assess for pancreatitis. Spleen: The spleen measures 12.5 by 5.3 by 9.9 cm (volume = 340 cm^3). No significant focal splenic lesion. Adrenals/Urinary Tract:  Unremarkable Stomach/Bowel: Unremarkable Vascular/Lymphatic: Varices along the gastric fundus. Suspicion for left portal vein thrombosis as noted in the liver section above. The right portal vein, main portal vein, splenic vein, and SMV appear patent. Esophageal and gastric varices indicative of portal venous hypertension. Abdominal aortic atherosclerosis. Other:  Trace fluid in the left paracolic gutter. Musculoskeletal: Suspected superior endplate compression fracture at T8, not readily visible on 01/25/2022, likely subacute. IMPRESSION: 1. The biliary stent appears to extend from the duodenum to the vicinity of a central mass near the confluence of the right and left hepatic ducts. There is mild right and left intrahepatic biliary dilatation indicating that the stent may possibly not be fully traversing the mass, or may only be partially traversing the mass. The degree of left hepatic lobe biliary dilatation is mildly improved from 04/14/2022 but not resolved. 2. No well-defined enhancement in the left portal vein, suspicious for left portal vein thrombosis. 3. Mild pancreatic and peripancreatic edema, correlate with lipase levels to assess for pancreatitis. 4. Stable bilateral pulmonary nodules and right basilar atelectasis along with small right pleural effusion. Mild cardiomegaly. 5. Superior endplate compression fracture at T8, not readily visible on 01/25/2022, likely subacute. 6. Uphill paraesophageal and gastric varices indicating portal venous hypertension. 7. Trace fluid in the left paracolic gutter. 8. Cholelithiasis. Electronically Signed   By: Van Clines M.D.   On: 04/20/2022 09:59    Medications: I have reviewed the patient's current medications.  Assessment/Plan: Rectal cancer Mass at 7 cm from the anal verge on  colonoscopy 08/09/2017, biopsy revealed invasive adenocarcinoma Staging CTs 08/17/2017-no evidence of metastatic disease, asymmetric thickening in the mid rectum MR pelvis 09/01/2017, T3N0 lesion beginning at 6.3 cm from the anal sphincter Radiation/Xeloda initiated 09/17/2017, completed 10/25/2017 Low anterior resection/diverting ileostomy 12/21/2017,ypT3,ypN1a tumor.  Lymphovascular invasion present, intact mismatch repair protein expression, treatment effect present (TRS 1).  Mismatch repair protein IHC normal; Foundation 1-KRAS/NRAS wild-type, microsatellite status and tumor mutational burden could not be determined. Cycle 1 adjuvant Xeloda beginning 01/21/2018 Cycle 2 adjuvant Xeloda beginning 02/11/2018 Xeloda discontinued after cycle 2 secondary to patient preference Ileostomy takedown 07/10/2018 CTs 09/07/2018- multiple live small pulmonary nodules concerning for metastatic disease CT chest 12/10/2018-multiple bilateral lung nodules, some have increased in size CT chest 04/08/2019-mild enlargement of bilateral lung nodules Status post SBRT multiple lung nodules 05/06/2019, 05/08/2019, 05/13/2019 CT chest 08/28/2019-improvement and resolution in majority of right-sided pulmonary nodules, a superior segment right lower lobe nodule has increased, no new right-sided nodules.  Progressive enlargement of left-sided pulmonary nodules PET scan 09/09/2019-hypermetabolic right lower lobe nodule, 2 hypermetabolic left upper lobe nodules with an additional 0.7 cm left upper lobe nodule below PET resolution, groundglass opacity in the mid to right lower lobe with associated hypermetabolism consistent with postradiation change, hypermetabolic central segment 4A liver lesion Cycle 1 FOLFOX 10/14/2019 Cycle  2FOLFOX 11/11/2019, 5-FU and oxaliplatin dose reduced secondary to neutropenia and thrombocytopenia following cycle one, G-CSF declined Cycle 3 FOLFOX 11/26/2019 Cycle 4 FOLFOX 12/11/2019, oxaliplatin held due to  neutropenia and thrombocytopenia Cycle 5 FOLFOX 12/31/2019 Cycle 6 FOLFOX 01/14/2020 (oxaliplatin held, 5-fluorouracil dose reduced due to mucositis) CT chest 01/27/2020-improvement in left lung nodules, progressive airspace  disease with traction bronchiectasis throughout the right lung, previously noted right lung nodules are obscured, mildly enlarged right paratracheal lymph nodes-not hypermetabolic on prior PET, likely reactive Cycle 7 FOLFOX 01/28/2020 Cycle 8 FOLFOX 02/11/2020 (oxaliplatin held due to neutropenia and thrombocytopenia) Cycle 9 FOLFOX 02/25/2020 (oxaliplatin held due to thrombocytopenia) Cycle 10 FOLFOX 03/11/2020 (oxaliplatin held secondary to neuropathy) Cycle 11 FOLFOX 03/25/2020 (oxaliplatin held due to neuropathy) CT chest 04/05/2020-no new or progressive findings.  Interval evolution of presumed postradiation scarring in the right perihilar lung with decrease in the more diffuse groundglass opacity seen previously.  No substantial change in left lung nodules. 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  No definite CT findings to suggest recurrent tumor.  Stable small left upper lobe pulmonary nodules.  No new or progressive findings.  Stable small right paratracheal lymph nodes.  Stable area of irregular low-attenuation in hepatic segment 4A, site of known prior hepatic metastatic lesion.  No new or progressive findings. Cycle 18 5-fluorouracil 07/06/2020 Cycle 19 5-fluorouracil 07/20/2020 Cycle 20 5-fluorouracil 08/03/2020 Cycle 21 5-fluorouracil 08/17/2020  Cycle 22 5-fluorouracil 08/31/2020 Cycle 23 5-fluorouracil 09/14/2020 CT chest 09/24/2020-increased number and size of pulmonary nodules  bilaterally.  Findings in the right upper lobe are concerning for potential lymphangitic spread of tumor.  Right upper lobe findings could also reflect evolving postradiation change. Cycle 1 FOLFIRI 10/12/2020 Cycle 2 FOLFIRI 11/08/2020, irinotecan dose reduced due to neutropenia and thrombocytopenia following cycle 1 Cycle 3 FOLFIRI 12/01/2020 Cycle 4 FOLFIRI 12/22/2020 CT chest 01/11/2021-similar bilateral pulmonary nodules.  Left upper lobe nodule has mildly decreased in size.  No new nodules. Cycle 5 FOLFIRI 01/12/2021 Cycle 6 FOLFIRI 02/03/2021 Cycle 7 FOLFIRI 03/01/2021 Cycle 8 FOLFIRI 03/22/2021 CT chest 04/08/2021-multiple small bilateral lung nodules not significantly changed.  No new nodules.  Unchanged enlarged pretracheal nodes.  Increase in fibrotic consolidation of the perihilar and inferior right lung. Cycle 9 FOLFIRI 04/12/2021 05/04/2021 treatment held due to fatigue Cycle 10 FOLFIRI 05/18/2021 06/07/2021-treatment held secondary to neutropenia Cycle 11 FOLFIRI 06/14/2021 Cycle 12 FOLFIRI 07/05/2021 Cycle 13 FOLFIRI 07/27/2021, irinotecan held 14 FOLFIRI 08/16/2021, irinotecan held CT chest 09/02/2021-enlargement of several lung nodules, no new nodules, stable postradiation changes in the right lower lobe and right middle lobe, gastroesophageal varices identified Cycle one 5-FU/Panitumumab 11/01/2021 Cycle two 5-FU/panitumumab 11/29/2021-Solu-Medrol and Pepcid prophylaxis added after she developed a cough during the panitumumab with cycle 1 Cycle 3 5-FU/panitumumab 12/12/2021 Cycle four 5-FU/panitumumab 12/27/2021 CT chest 01/25/2022-mild progression of metastatic disease to the lungs with increased size but stable number of metastatic lesions. Cycle 1 Lonsurf 02/27/2022 Cycle 2 Lonsurf 04/10/2022, placed on hold 04/13/2022 due to LFT abnormalities CT abdomen/pelvis 04/13/2022-persistent/chronic intrahepatic biliary dilatation most notable left hepatic lobe; vague area of low-attenuation near the  porta hepatis, MRI recommended. MRI liver 04/14/2022-hypoenhancing 2.7 x 1.9 cm central liver mass in the region of the biliary hilum.  Associated high-grade biliary stricture at the biliary hilum affecting the central right and left intrahepatic bile ducts.       2.   Hypothyroid   3.    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  Port-A-Cath placement 09/29/2019, interventional radiology   5.  Neutropenia and thrombocytopenia following cycle 1 FOLFOX-plan chemotherapy dose reductions, she declined G-CSF   6.  Mucositis following cycle 5 FOLFOX, 5-fluorouracil dose reduced with cycle 6 Mucositis following cycle 22 5-fluorouracil-Magic mouthwash added   7.  Right lung airspace disease/volume loss-likely toxicity from chest radiation, trial of prednisone 01/29/2020; dyspnea and cough improved 02/11/2020 Progressive cough 03/11/2020-prednisone resumed at a dose of 20 mg daily Cough and dyspnea improved 03/25/2020-prednisone taper to 15 mg daily Improved 04/06/2020-prednisone taper to 10 mg daily Stable 04/21/2020-prednisone taper to 5 mg daily   8.  Gastroesophageal varices   9.   Admission 04/17/2022 with obstructive jaundice secondary to a central liver mass ERCP 04/18/2022-biliary obstruction secondary to a central liver mass, status post stent placement MRI/MRCP-paraesophageal varices, or a stent extending from the duodenum to the confluence of the right and left hepatic duct stent does not extend into either the right or left duct, 4 x 1.7 cm central liver mass, suspicion for left portal vein thrombosis, mild pancreatic and peripancreatic edema, esophagus and gastric varices  10.  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  Pancreatitis   Ms. 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  She is admitted with biliary obstruction.  She underwent an ERCP and stent placement on 04/18/2022.  The bilirubin remains markedly  elevated and she appears to have developed post ERCP pancreatitis.  The lipase was lower yesterday and abdominal pain is improved today.  The MRI suggest the current stent is not relieving the biliary obstruction.  The low-grade fever last night and leukocytosis are likely related to biliary obstruction and pancreatitis.  She has chronic portal hypertension.  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  I do not recommend anticoagulation therapy.  The liver mass causing biliary obstruction is likely secondary to metastatic rectal cancer, but a cholangiocarcinoma is possible.  Recommendations: Management of bile duct obstruction per gastroenterology Systemic therapy for rectal cancer remains on hold Please call oncology as needed I will check on her 04/24/2022 outpatient follow-up will be scheduled at the Cancer center   LOS: 4 days   Betsy Coder, MD   04/21/2022, 7:28 AM

## 2022-04-21 NOTE — Interval H&P Note (Signed)
History and Physical Interval Note:  04/21/2022 12:42 PM  Samantha Lutz  has presented today for surgery, with the diagnosis of biliary obstruction. Remove bile duct stent.  The various methods of treatment have been discussed with the patient and family. After consideration of risks, benefits and other options for treatment, the patient has consented to  Procedure(s): ESOPHAGOGASTRODUODENOSCOPY (EGD) WITH PROPOFOL (N/A) as a surgical intervention.  The patient's history has been reviewed, patient examined, no change in status, stable for surgery.  I have reviewed the patient's chart and labs.  Questions were answered to the patient's satisfaction.     Silvano Rusk

## 2022-04-21 NOTE — Transfer of Care (Signed)
Immediate Anesthesia Transfer of Care Note  Patient: Samantha Lutz  Procedure(s) Performed: ESOPHAGOGASTRODUODENOSCOPY (EGD) WITH PROPOFOL STENT REMOVAL  Patient Location: PACU  Anesthesia Type:MAC  Level of Consciousness: sedated  Airway & Oxygen Therapy: Patient Spontanous Breathing and Patient connected to face mask oxygen  Post-op Assessment: Report given to RN and Post -op Vital signs reviewed and stable  Post vital signs: Reviewed and stable  Last Vitals:  Vitals Value Taken Time  BP    Temp    Pulse    Resp    SpO2      Last Pain:  Vitals:   04/21/22 1145  TempSrc: Temporal  PainSc: 0-No pain         Complications: No notable events documented.

## 2022-04-21 NOTE — Anesthesia Postprocedure Evaluation (Signed)
Anesthesia Post Note  Patient: Samantha Lutz  Procedure(s) Performed: ESOPHAGOGASTRODUODENOSCOPY (EGD) WITH PROPOFOL STENT REMOVAL     Patient location during evaluation: PACU Anesthesia Type: MAC Level of consciousness: awake and alert and oriented Pain management: pain level controlled Vital Signs Assessment: post-procedure vital signs reviewed and stable Respiratory status: spontaneous breathing, nonlabored ventilation and respiratory function stable Cardiovascular status: stable and blood pressure returned to baseline Postop Assessment: no apparent nausea or vomiting Anesthetic complications: no   No notable events documented.  Last Vitals:  Vitals:   04/21/22 1330 04/21/22 1352  BP: (!) 142/55 130/65  Pulse: 95 93  Resp: (!) 22 18  Temp:  37.1 C  SpO2: 96% 97%    Last Pain:  Vitals:   04/21/22 1352  TempSrc: Oral  PainSc:                  Jarrad Mclees A.

## 2022-04-21 NOTE — Anesthesia Preprocedure Evaluation (Addendum)
Anesthesia Evaluation  Patient identified by MRN, date of birth, ID band Patient awake    Reviewed: Allergy & Precautions, NPO status , Patient's Chart, lab work & pertinent test results  Airway Mallampati: I  TM Distance: >3 FB Neck ROM: Full    Dental  (+) Teeth Intact, Dental Advisory Given   Pulmonary COPD,  COPD inhaler   Pulmonary exam normal breath sounds clear to auscultation       Cardiovascular hypertension, Normal cardiovascular exam Rhythm:Regular Rate:Normal  Not currently on Rx for HTN  EKG 04/19/22 NSR with frequent PVC's   Neuro/Psych negative neurological ROS  negative psych ROS   GI/Hepatic Biliary obstruction- indwelling stent Elevated LFT's Hx/o rectal Ca S/P Low anterior resection, ostomy and closure   Endo/Other  Hypothyroidism  Obesity  Renal/GU negative Renal ROS  negative genitourinary   Musculoskeletal  (+) Arthritis , Osteoarthritis,    Abdominal   Peds  Hematology  (+) Blood dyscrasia, anemia Thrombocytopenia- Plt 85k    Anesthesia Other Findings Metastatic rectal CA with biliary obstruction  Reproductive/Obstetrics                              Anesthesia Physical Anesthesia Plan  ASA: 3  Anesthesia Plan: MAC   Post-op Pain Management:    Induction: Intravenous  PONV Risk Score and Plan: 3 and Ondansetron, Treatment may vary due to age or medical condition, Propofol infusion and Dexamethasone  Airway Management Planned: Natural Airway, Nasal Cannula and Simple Face Mask  Additional Equipment: None  Intra-op Plan:   Post-operative Plan: Extubation in OR  Informed Consent: I have reviewed the patients History and Physical, chart, labs and discussed the procedure including the risks, benefits and alternatives for the proposed anesthesia with the patient or authorized representative who has indicated his/her understanding and acceptance.      Dental advisory given  Plan Discussed with: CRNA  Anesthesia Plan Comments:         Anesthesia Quick Evaluation

## 2022-04-21 NOTE — Progress Notes (Signed)
Referring Physician(s): Gessner,C/Sherrill,B  Supervising Physician: Mir, Biochemist, clinical  Patient Status:  [facility] - In-pt  Chief Complaint: Abdominal/back pain, metastatic rectal cancer, biliary obstruction   Subjective: Pt known to IR team from port a cath placement on 09/29/19. She has a PMH sig for hypothyroidism, GE varices, pancreatitis, thrombocytopenia, portal HTN, anemia, arthritis, fatty liver, and metastatic rectal cancer initially diagnosed in 2019. She now has obstructive jaundice from biliary obstruction and underwent stent placement by GI on 04/18/22. Her bilirubin remains elevated at 20.7 and WBC currently 17.7(11.6), plts 85k, hgb 10.5, PT/INR nl, creat nl.  MRI abd/MRCP yesterday revealed:   1. The biliary stent appears to extend from the duodenum to the vicinity of a central mass near the confluence of the right and left hepatic ducts. There is mild right and left intrahepatic biliary dilatation indicating that the stent may possibly not be fully traversing the mass, or may only be partially traversing the mass. The degree of left hepatic lobe biliary dilatation is mildly improved from 04/14/2022 but not resolved. 2. No well-defined enhancement in the left portal vein, suspicious for left portal vein thrombosis. 3. Mild pancreatic and peripancreatic edema, correlate with lipase levels to assess for pancreatitis. 4. Stable bilateral pulmonary nodules and right basilar atelectasis along with small right pleural effusion. Mild cardiomegaly. 5. Superior endplate compression fracture at T8, not readily visible on 01/25/2022, likely subacute. 6. Uphill paraesophageal and gastric varices indicating portal venous hypertension. 7. Trace fluid in the left paracolic gutter. 8. Cholelithiasis.   She is scheduled for EGD today with biliary stent removal; request now received for PTC with biliary drain. She denies fever,HA,CP, worsening dyspnea, cough, N/V or bleeding; she does have  abd/back pain    Past Medical History:  Diagnosis Date   Anemia    remote hx.   Arthritis    Bi lat knee   Cancer ([place]) 07/2017   Rectal Cancer    Fatty liver    ultrasound December 2012   Hypothyroid 01/16/2013 dx   Irregular heartbeat    PER PATIENT REPORT; ONSET 20+YEARS AGO SAW CARDIOLOGY AT THE TIME C/O "ITS BEAT REALLY FAST FOR A MINUTE AND THEN STOPPED " DENIES ANY OTHER CARDIAC SX';  REPORTS CARDIO DID ECHO WHICH WAS NEGATIVE;  NOW COMES AND GOES ;    Lichen sclerosus    steroids as needed   Temporary low platelet count ([place])    SEE LAST LABS IN EPIC   Past Surgical History:  Procedure Laterality Date   APPENDECTOMY  1967   BILIARY STENT PLACEMENT N/A 04/18/2022   Procedure: BILIARY STENT PLACEMENT;  Surgeon: Gatha Mayer, MD;  Location: [facility];  Service: Gastroenterology;  Laterality: N/A;   [place]  08/09/2017   danis - polyps   ERCP N/A 04/18/2022   Procedure: ENDOSCOPIC RETROGRADE CHOLANGIOPANCREATOGRAPHY (ERCP);  Surgeon: Gatha Mayer, MD;  Location: [facility];  Service: Gastroenterology;  Laterality: N/A;   ILEOSTOMY CLOSURE N/A 07/10/2018   Procedure: ILEOSTOMY LOOP TAKEDOWN WITH TAP BLOCK;  Surgeon: [name], MD;  Location: [facility];  Service: General;  Laterality: N/A;   IR IMAGING GUIDED PORT INSERTION  09/29/2019   OSTOMY N/A 12/21/2017   Procedure: DIVERTING LOOP OSTOMY;  Surgeon: [name], MD;  Location: [facility];  Service: General;  Laterality: N/A;   PROCTOSCOPY N/A 12/21/2017   Procedure: RIGID PROCTOSCOPY;  Surgeon: [name], MD;  Location: [facility];  Service: General;  Laterality: N/A;  XI ROBOTIC ASSISTED LOWER ANTERIOR RESECTION N/A 12/21/2017   Procedure: XI ROBOTIC ASSISTED LOWER ANTERIOR RESECTION;  Surgeon: [name], MD;  Location: [facility];  Service: General;  Laterality: N/A;     Allergies: Epinephrine, Losartan, Latex, and Vectibix [panitumumab]  Medications: Prior to  Admission medications   Medication Sig Start Date End Date Taking? Authorizing Provider  albuterol (PROVENTIL HFA) 108 (90 Base) MCG/ACT inhaler Inhale 2 puffs into the lungs every 6 (six) hours as needed for wheezing or shortness of breath. 08/03/20  Yes Owens Shark, NP  bisacodyl (DULCOLAX) 5 MG EC tablet Take 5 mg by mouth daily as needed for mild constipation or moderate constipation.   Yes [provider]  levothyroxine (SYNTHROID) 50 MCG tablet Take 1 tablet by mouth once daily Patient taking differently: Take 50 mcg by mouth daily before breakfast. 02/06/22  Yes Ladell Pier, MD  lidocaine-prilocaine (EMLA) cream Apply 1 Application topically as needed. Apply to port site 1-2 hours prior to use Patient taking differently: Apply 1 Application topically as needed (for port access- one to two hours prior to accessing). 12/12/21  Yes Owens Shark, NP  magic mouthwash SOLN Take 5 mLs by mouth 4 (four) times daily as needed for mouth pain. 5 ml swish and spit 4 times a daily as needed for mouth pain 09/14/20  Yes Ladell Pier, MD  magnesium oxide (MAG-OX) 400 (240 Mg) MG tablet Take 1 tablet (400 mg total) by mouth 2 (two) times daily. 02/22/22  Yes Ladell Pier, MD  Nutritional Supplements (ENSURE ORIGINAL) LIQD Take 237 mLs by mouth See admin instructions. Drink 237 ml's (1 CHOCOLATE shake) by mouth once a day   Yes [provider]  potassium chloride (MICRO-K) 10 MEQ CR capsule Take 10 mEq by mouth daily.   Yes [provider]  predniSONE (DELTASONE) 5 MG tablet Take 1 tablet by mouth once daily with breakfast 03/15/22  Yes Ladell Pier, MD  prochlorperazine (COMPAZINE) 10 MG tablet Take 1 tablet (10 mg total) by mouth every 6 (six) hours as needed for nausea or vomiting. 03/01/21  Yes Owens Shark, NP  SYSTANE ULTRA PF 0.4-0.3 % SOLN Place 1 drop into both eyes 3 (three) times daily as needed (for dryness).   Yes [provider]  ciprofloxacin  (CIPRO) 250 MG tablet Take 1 tablet (250 mg total) by mouth 2 (two) times daily for 7 days. Patient not taking: Reported on 04/17/2022 04/14/22 04/21/22  Owens Shark, NP  nystatin (MYCOSTATIN/NYSTOP) powder Apply 1 Application topically 3 (three) times daily. Patient not taking: Reported on 04/17/2022 12/12/21   Owens Shark, NP  Spacer/Aero-Holding Chambers (AEROCHAMBER MV) inhaler Use as instructed 12/31/19   Ladell Pier, MD  trifluridine-tipiracil (LONSURF) 20-8.19 MG tablet Take 2 tablets (40 mg Trifluridine) twice daily, take within 1 hr after AM & PM meals on days 1-5, 8-12. Repeat every 28 days. Start cycle on 04/10/22 Patient not taking: Reported on 04/17/2022 04/04/22   Ladell Pier, MD     Vital Signs: BP (!) 137/53 (BP Location: Right Arm)   Pulse 95   Temp 98.5 F (36.9 C) (Oral)   Resp 16   Ht 5\' 3"  (1.6 m)   Wt 201 lb 4.5 oz (91.3 kg)   LMP  (LMP Unknown)   SpO2 97%   BMI 35.66 kg/m   Physical Exam: awake/alert; scleral icterus; scattered ecchymoses around arms/eyes ; chest- dim BS rt base, left clear; heart-  nl rate, occ ectopy; abd- soft,+BS, tender RUQ/epigastric regions; no LE edema  Imaging: MR ABDOMEN MRCP W WO CONTAST  Result Date: 04/20/2022 CLINICAL DATA:  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 Heavily T2-weighted images of the biliary and pancreatic ducts were obtained, and three-dimensional MRCP images were rendered by post processing. CONTRAST:  37mL GADAVIST GADOBUTROL 1 MMOL/ML IV SOLN COMPARISON:  ERCP from 04/18/2022 and MRI abdomen through 04/14/2022 FINDINGS: Despite efforts by the technologist and patient, motion artifact is present on today's exam and could not be eliminated. This  reduces exam sensitivity and specificity. Lower chest: Stable bilateral pulmonary nodules and right basilar atelectasis along with small right pleural effusion. Mild cardiomegaly. Uphill paraesophageal varices. Hepatobiliary: Dependent gallstones in the gallbladder. The biliary stent is difficult to visualize and is probably best seen on the axial postcontrast images extending from the duodenum up to the biliary tree to the confluence of the right and left hepatic ducts. The stent is not well visualized extending into either the right or left bile duct, and seems to terminate in the vicinity of a central mass which is near the confluence of the right and left hepatic ducts. This mass has indistinct borders complicated by differences in the degree of enhancement of the left hepatic lobe in a right hepatic lobe, with delayed enhancement of the left hepatic lobe compared to the right, but is thought to measure at least about 4.0 by 1.7 cm on image 30 of series 17. There is mild right (particularly right posterior) and left intrahepatic biliary dilatation indicating that the stent may possibly not be fully traversing the mass, or may only be partially traversing the mass. The degree of left hepatic lobe biliary dilatation is mildly improved from 04/14/2022 but not resolved. No well-defined left portal vein enhancement, suspicious for left portal vein thrombosis. Pancreas: Mild pancreatic and peripancreatic edema, correlate with lipase levels to assess for pancreatitis. Spleen: The spleen measures 12.5 by 5.3 by 9.9 cm (volume = 340 cm^3). No significant focal splenic lesion. Adrenals/Urinary Tract:  Unremarkable Stomach/Bowel: Unremarkable Vascular/Lymphatic: Varices along the gastric fundus. Suspicion for left portal vein thrombosis as noted in the liver section above. The right portal vein, main portal vein, splenic vein, and SMV appear patent. Esophageal and gastric varices indicative of portal venous hypertension.  Abdominal aortic atherosclerosis. Other:  Trace fluid in the left paracolic gutter. Musculoskeletal: Suspected superior endplate compression fracture at T8, not readily visible on 01/25/2022, likely subacute. IMPRESSION: 1. The biliary stent appears to extend from the duodenum to the vicinity of a central mass near the confluence of the right and left hepatic ducts. There is mild right and left intrahepatic biliary dilatation indicating that the stent may possibly not be fully traversing the mass, or may only be partially traversing the mass. The degree of left hepatic lobe biliary dilatation is mildly improved from 04/14/2022 but not resolved. 2. No well-defined enhancement in the left portal vein, suspicious for left portal vein thrombosis. 3. Mild pancreatic and peripancreatic edema, correlate with lipase levels to assess for pancreatitis. 4. Stable bilateral pulmonary nodules and right basilar atelectasis along with small right pleural effusion. Mild cardiomegaly. 5. Superior endplate compression fracture at T8, not readily visible on 01/25/2022, likely subacute. 6. Uphill paraesophageal and gastric varices indicating portal venous hypertension.  7. Trace fluid in the left paracolic gutter. 8. Cholelithiasis. Electronically Signed   By: Van Clines M.D.   On: 04/20/2022 09:59   MR 3D Recon At Scanner  Result Date: 04/20/2022 CLINICAL DATA:  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 Heavily T2-weighted images of the biliary and pancreatic ducts were obtained, and three-dimensional MRCP images were rendered by post processing. CONTRAST:  21mL GADAVIST GADOBUTROL 1 MMOL/ML IV SOLN COMPARISON:  ERCP from 04/18/2022 and MRI abdomen through  04/14/2022 FINDINGS: Despite efforts by the technologist and patient, motion artifact is present on today's exam and could not be eliminated. This reduces exam sensitivity and specificity. Lower chest: Stable bilateral pulmonary nodules and right basilar atelectasis along with small right pleural effusion. Mild cardiomegaly. Uphill paraesophageal varices. Hepatobiliary: Dependent gallstones in the gallbladder. The biliary stent is difficult to visualize and is probably best seen on the axial postcontrast images extending from the duodenum up to the biliary tree to the confluence of the right and left hepatic ducts. The stent is not well visualized extending into either the right or left bile duct, and seems to terminate in the vicinity of a central mass which is near the confluence of the right and left hepatic ducts. This mass has indistinct borders complicated by differences in the degree of enhancement of the left hepatic lobe in a right hepatic lobe, with delayed enhancement of the left hepatic lobe compared to the right, but is thought to measure at least about 4.0 by 1.7 cm on image 30 of series 17. There is mild right (particularly right posterior) and left intrahepatic biliary dilatation indicating that the stent may possibly not be fully traversing the mass, or may only be partially traversing the mass. The degree of left hepatic lobe biliary dilatation is mildly improved from 04/14/2022 but not resolved. No well-defined left portal vein enhancement, suspicious for left portal vein thrombosis. Pancreas: Mild pancreatic and peripancreatic edema, correlate with lipase levels to assess for pancreatitis. Spleen: The spleen measures 12.5 by 5.3 by 9.9 cm (volume = 340 cm^3). No significant focal splenic lesion. Adrenals/Urinary Tract:  Unremarkable Stomach/Bowel: Unremarkable Vascular/Lymphatic: Varices along the gastric fundus. Suspicion for left portal vein thrombosis as noted in the liver section above. The  right portal vein, main portal vein, splenic vein, and SMV appear patent. Esophageal and gastric varices indicative of portal venous hypertension. Abdominal aortic atherosclerosis. Other:  Trace fluid in the left paracolic gutter. Musculoskeletal: Suspected superior endplate compression fracture at T8, not readily visible on 01/25/2022, likely subacute. IMPRESSION: 1. The biliary stent appears to extend from the duodenum to the vicinity of a central mass near the confluence of the right and left hepatic ducts. There is mild right and left intrahepatic biliary dilatation indicating that the stent may possibly not be fully traversing the mass, or may only be partially traversing the mass. The degree of left hepatic lobe biliary dilatation is mildly improved from 04/14/2022 but not resolved. 2. No well-defined enhancement in the left portal vein, suspicious for left portal vein thrombosis. 3. Mild pancreatic and peripancreatic edema, correlate with lipase levels to assess for pancreatitis. 4. Stable bilateral pulmonary nodules and right basilar atelectasis along with small right pleural effusion. Mild cardiomegaly. 5. Superior endplate compression fracture at T8, not readily visible on 01/25/2022, likely  subacute. 6. Uphill paraesophageal and gastric varices indicating portal venous hypertension. 7. Trace fluid in the left paracolic gutter. 8. Cholelithiasis. Electronically Signed   By: Van Clines M.D.   On: 04/20/2022 09:59   DG ERCP  Result Date: 04/18/2022 CLINICAL DATA:  Liver mass. EXAM: ERCP TECHNIQUE: Multiple spot images obtained with the fluoroscopic device and submitted for interpretation post-procedure. FLUOROSCOPY TIME: FLUOROSCOPY TIME 30 minutes, 40 seconds (434.4 mGy) COMPARISON:  CT abdomen pelvis-04/13/2022; abdominal MRI-04/14/2022 FINDINGS: Ten spot intraoperative fluoroscopic images of the right upper abdominal quadrant during ERCP are provided for review Initial image demonstrates an  ERCP probe overlying the right upper abdominal quadrant. Subsequent images demonstrate selective cannulation and opacification of the common bile duct which appears nondilated. There is tapered narrowing/subtotal occlusion of the common hepatic duct at the level of the biliary hilum with subsequent opacification of the left intrahepatic biliary system and ultimately with internal biliary stent placement. There is minimal opacification of the cystic duct with partial opacification of the gallbladder. IMPRESSION: ERCP with suspected malignant subtotal occlusion of the intrahepatic biliary tree at the level of the biliary hilum with subsequent biliary stent placement. These images were submitted for radiologic interpretation only. Please see the procedural report for the amount of contrast and the fluoroscopy time utilized. Electronically Signed   By: Sandi Mariscal M.D.   On: 04/18/2022 15:12   DG C-Arm 1-60 Min-No Report  Result Date: 04/18/2022 Fluoroscopy was utilized by the requesting physician.  No radiographic interpretation.    Labs:  CBC: Recent Labs    04/18/22 0528 04/19/22 0500 04/20/22 0500 04/21/22 0542  WBC 2.4* 5.9 11.6* 17.7*  HGB 9.3* 10.5* 11.6* 10.5*  HCT 28.6* 31.9* 34.6* 31.3*  PLT 92* 106* 102* 85*    COAGS: Recent Labs    04/17/22 0841  INR 1.1    BMP: Recent Labs    04/18/22 0528 04/19/22 0500 04/20/22 0500 04/21/22 0542  NA 139 140 130* 129*  K 3.5 4.3 4.1 4.1  CL 108 107 99 99  CO2 23 24 21* 21*  GLUCOSE 111* 130* 98 106*  BUN 12 13 11 18   CALCIUM 7.9* 8.8* 8.1* 7.8*  CREATININE 0.68 0.55 0.39* 0.68  GFRNONAA >60 >60 >60 >60    LIVER FUNCTION TESTS: Recent Labs    04/18/22 0528 04/19/22 0500 04/20/22 0500 04/21/22 0542  BILITOT 7.8* 12.7* 19.1* 20.7*  AST 140* 148* 212* 148*  ALT 93* 106* 136* 111*  ALKPHOS 168* 190* 223* 168*  PROT 5.5* 6.1* 6.0* 5.5*  ALBUMIN 2.7* 2.8* 2.7* 2.4*    Assessment and Plan: Pt known to IR team from port  a cath placement on 09/29/19. She has a PMH sig for hypothyroidism, GE varices, pancreatitis, thrombocytopenia, portal HTN, anemia, arthritis, fatty liver, and metastatic rectal cancer initially diagnosed in 2019. She now has obstructive jaundice from biliary obstruction and underwent stent placement by GI on 04/18/22. Her bilirubin remains elevated at 20.7 and WBC currently 17.7(11.6), plts 85k, hgb 10.5, PT/INR nl, creat nl.  MRI abd/MRCP yesterday revealed:   1. The biliary stent appears to extend from the duodenum to the vicinity of a central mass near the confluence of the right and left hepatic ducts. There is mild right and left intrahepatic biliary dilatation indicating that the stent may possibly not be fully traversing the mass, or may only be partially traversing the mass. The degree of left hepatic lobe biliary dilatation is mildly improved from 04/14/2022 but not resolved. 2. No well-defined  enhancement in the left portal vein, suspicious for left portal vein thrombosis. 3. Mild pancreatic and peripancreatic edema, correlate with lipase levels to assess for pancreatitis. 4. Stable bilateral pulmonary nodules and right basilar atelectasis along with small right pleural effusion. Mild cardiomegaly. 5. Superior endplate compression fracture at T8, not readily visible on 01/25/2022, likely subacute. 6. Uphill paraesophageal and gastric varices indicating portal venous hypertension. 7. Trace fluid in the left paracolic gutter. 8. Cholelithiasis.   She is scheduled for EGD today with biliary stent removal; request now received for PTC with biliary drain. Imaging studies were reviewed by Dr. Dwaine Gale. Risks and benefits discussed with the patient including bleeding, infection, damage to adjacent structures, and sepsis. Currently on IV Unasyn.   All of the patient's questions were answered, patient is agreeable to proceed. Consent signed and in chart. Procedure tent scheduled for this  afternoon.       Electronically Signed: D. Rowe Robert, PA-C 04/21/2022, 9:46 AM   I spent a total of 25 minutes at the the patient's bedside AND on the patient's hospital floor or unit, greater than 50% of which was counseling/coordinating care for percutaneous transhepatic cholangiogram with biliary drain placement    Patient ID: Samantha Lutz, female   DOB: 1950/11/24, 72 y.o.   MRN: [id number]

## 2022-04-22 DIAGNOSIS — K859 Acute pancreatitis without necrosis or infection, unspecified: Secondary | ICD-10-CM | POA: Diagnosis not present

## 2022-04-22 DIAGNOSIS — K9189 Other postprocedural complications and disorders of digestive system: Secondary | ICD-10-CM | POA: Diagnosis not present

## 2022-04-22 DIAGNOSIS — K831 Obstruction of bile duct: Secondary | ICD-10-CM | POA: Diagnosis not present

## 2022-04-22 LAB — CBC WITH DIFFERENTIAL/PLATELET
Abs Immature Granulocytes: 0.15 10*3/uL — ABNORMAL HIGH (ref 0.00–0.07)
Basophils Absolute: 0 10*3/uL (ref 0.0–0.1)
Basophils Relative: 0 %
Eosinophils Absolute: 0 10*3/uL (ref 0.0–0.5)
Eosinophils Relative: 0 %
HCT: 27.9 % — ABNORMAL LOW (ref 36.0–46.0)
Hemoglobin: 9.3 g/dL — ABNORMAL LOW (ref 12.0–15.0)
Immature Granulocytes: 1 %
Lymphocytes Relative: 4 %
Lymphs Abs: 0.4 10*3/uL — ABNORMAL LOW (ref 0.7–4.0)
MCH: 34.6 pg — ABNORMAL HIGH (ref 26.0–34.0)
MCHC: 33.3 g/dL (ref 30.0–36.0)
MCV: 103.7 fL — ABNORMAL HIGH (ref 80.0–100.0)
Monocytes Absolute: 1.1 10*3/uL — ABNORMAL HIGH (ref 0.1–1.0)
Monocytes Relative: 10 %
Neutro Abs: 9.1 10*3/uL — ABNORMAL HIGH (ref 1.7–7.7)
Neutrophils Relative %: 85 %
Platelets: 86 10*3/uL — ABNORMAL LOW (ref 150–400)
RBC: 2.69 MIL/uL — ABNORMAL LOW (ref 3.87–5.11)
RDW: 19.8 % — ABNORMAL HIGH (ref 11.5–15.5)
WBC: 10.7 10*3/uL — ABNORMAL HIGH (ref 4.0–10.5)
nRBC: 0 % (ref 0.0–0.2)

## 2022-04-22 LAB — COMPREHENSIVE METABOLIC PANEL
ALT: 91 U/L — ABNORMAL HIGH (ref 0–44)
AST: 101 U/L — ABNORMAL HIGH (ref 15–41)
Albumin: 2 g/dL — ABNORMAL LOW (ref 3.5–5.0)
Alkaline Phosphatase: 150 U/L — ABNORMAL HIGH (ref 38–126)
Anion gap: 8 (ref 5–15)
BUN: 26 mg/dL — ABNORMAL HIGH (ref 8–23)
CO2: 24 mmol/L (ref 22–32)
Calcium: 7.8 mg/dL — ABNORMAL LOW (ref 8.9–10.3)
Chloride: 101 mmol/L (ref 98–111)
Creatinine, Ser: 0.64 mg/dL (ref 0.44–1.00)
GFR, Estimated: >60 mL/min (ref >60)
Glucose, Bld: 93 mg/dL (ref 70–99)
Potassium: 3.8 mmol/L (ref 3.5–5.1)
Sodium: 133 mmol/L — ABNORMAL LOW (ref 135–145)
Total Bilirubin: 20 mg/dL (ref 0.3–1.2)
Total Protein: 5.1 g/dL — ABNORMAL LOW (ref 6.5–8.1)

## 2022-04-22 LAB — MAGNESIUM: Magnesium: 1.9 mg/dL (ref 1.7–2.4)

## 2022-04-22 LAB — PHOSPHORUS: Phosphorus: 3.6 mg/dL (ref 2.5–4.6)

## 2022-04-22 MED ORDER — ENOXAPARIN SODIUM 40 MG/0.4ML IJ SOSY
40.0000 mg | PREFILLED_SYRINGE | INTRAMUSCULAR | Status: DC
  Administered 2022-04-22 – 2022-04-24 (×3): 40 mg via SUBCUTANEOUS
  Filled 2022-04-22 (×3): qty 0.4

## 2022-04-22 NOTE — Progress Notes (Signed)
PROGRESS NOTE    Samantha Lutz  [id number] DOB: 1950-05-25 DOA: 04/17/2022 PCP: Samantha Lefort, FNP  No chief complaint on file.   Brief Narrative:   [name] is Samantha Lutz 72 yo female with PMH rectal cancer with liver metastasis who presented with mild abdominal discomfort and outpatient elevated total bilirubin.  This has been trended and found to not be resolving.  MRI liver was obtained on 04/14/2022 which showed underlying hypoenhancing central liver mass measuring 2.7 x 1.9 cm compatible with solitary recurrent liver metastasis.  Also showed underlying associated high-grade malignant biliary hilar stricture.  She was admitted for GI evaluation and expedited endoscopy for further evaluation.  Assessment & Plan:   Principal Problem:   Biliary obstruction Active Problems:   Rectal cancer ypT3ypN1a (1/23 LN) s/p neoadj chemoXRT, robotic LAR resection & diverting loop ileostomy 12/21/2017   Hypertension   Hypothyroid   Post-ERCP acute pancreatitis   Disorder of bile duct stent  Biliary obstruction - Underwent MRI liver on 04/14/2022 showing high-grade malignant biliary hilar stricture - s/p ERCP 3/19 with localized biliary strictures found in the hepatitic duct system from extrahepatic mestastasis.  Strictures malignant appearing.  Treated with stent placement.  - unasyn, per GI  - MRCP with biliary stent extending from duodenum to vicinity of central mass near the confluence of the R and L hepatic ducts - mild right and L intrahepatic biliary dilatation indicates that the stent may possibly not be transversing the mass or maybe only partially transversing the mass - L hepatic lobe biliary dilatation is mildly improved but not resolved.   - bili relatively stable today, AST/ALT/alk phos downtrending - removal of biliary stent 3/22 - s/p external biliary drain in right hepatic bile duct 3/22, difficult placement, bleeding when drain/catheter not in place (due to intercostal  vessel), treated with liod with epi and pursestring suture - no bleeding with drain in place.  Per Rads, concern that hyperbili not due to biliary obstruction as CBD patent to level of duodenum, consider internalization if bilirubin improves with external drainage. - GI now signed off, may take time for bili to normalize  Post ERCP Pancreatitis - seems improved, will follow    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 Vein Thrombosis - no need for anticoagulation per discussion with oncology  Leukocytosis  Fever - due to above, continue abx - improving - last fever 3/21 PM  Hyponatremia - improved, hold further LR - will continue to monitor  Rectal cancer ypT3ypN1a (1/23 LN) s/p neoadj chemoXRT, robotic LAR resection & diverting loop ileostomy 12/21/2017 - Follows closely outpatient with oncology; has been needed with [facility]; cycle 2 placed on hold recently due to LFT's   Hypothyroid - Continue Synthroid   Hypertension - recently more elevated - will continue to monitor for now, consider adding meds (treat pain, discomfort first)  T8 Compression Fracture - noted, follow    DVT prophylaxis: lovenox Code Status: full Family Communication: none Disposition:   Status is: Inpatient Remains inpatient appropriate because: worsening LFT's, further GI recs pending   Consultants:  GI  Procedures:  ERCP  Antimicrobials:  Anti-infectives (From admission, onward)    Start     Dose/Rate Route Frequency Ordered Stop   04/18/22 1230  ampicillin-sulbactam (UNASYN) 1.5 g in sodium chloride 0.9 % 100 mL IVPB  Status:  Discontinued        1.5 g 200 mL/hr over 30 Minutes Intravenous  Once 04/18/22 1227 04/18/22 1531   04/18/22 1100  ampicillin-sulbactam (UNASYN) 1.5 g  in sodium chloride 0.9 % 100 mL IVPB        1.5 g 200 mL/hr over 30 Minutes Intravenous Every 6 hours 04/18/22 0958         Subjective: No new complaints  Objective: Vitals:   04/21/22 1800 04/21/22 2135 04/22/22 0542 04/22/22  1315  BP: (!) 143/51 (!) 125/56 (!) 120/55 134/64  Pulse: 95 96 86 83  Resp: 14 18 14 18   Temp:  97.9 F (36.6 C) 98.6 F (37 C) 98.2 F (36.8 C)  TempSrc:  Oral Oral Oral  SpO2: 99% 94% 93% 93%  Weight:   96.7 kg   Height:        Intake/Output Summary (Last 24 hours) at 04/22/2022 1347 Last data filed at 04/22/2022 1346 Gross per 24 hour  Intake 740 ml  Output 30 ml  Net 710 ml   Filed Weights   04/18/22 1230 04/21/22 1400 04/22/22 0542  Weight: 91.3 kg 95.5 kg 96.7 kg    Examination:  General: No acute distress. Cardiovascular: RRR Lungs: unlabored Abdomen: Soft, nontender, nondistended - perc drain Neurological: Alert and oriented 3. Moves all extremities 4 with equal strength. Cranial nerves II through XII grossly intact. Extremities: No clubbing or cyanosis. No edema.  Data Reviewed: I have personally reviewed following labs and imaging studies  CBC: Recent Labs  Lab 04/17/22 1645 04/17/22 1934 04/18/22 0528 04/19/22 0500 04/20/22 0500 04/21/22 0542 04/22/22 0849  WBC 5.0   < > 2.4* 5.9 11.6* 17.7* 10.7*  NEUTROABS 3.6  --   --  5.1  --  14.2* 9.1*  HGB 10.7*   < > 9.3* 10.5* 11.6* 10.5* 9.3*  HCT 32.7*   < > 28.6* 31.9* 34.6* 31.3* 27.9*  MCV 102.5*   < > 102.9* 102.6* 100.6* 101.6* 103.7*  PLT 111*   < > 92* 106* 102* 85* 86*   < > = values in this interval not displayed.    Basic Metabolic Panel: Recent Labs  Lab 04/18/22 0528 04/19/22 0500 04/20/22 0500 04/21/22 0542 04/22/22 0849  NA 139 140 130* 129* 133*  K 3.5 4.3 4.1 4.1 3.8  CL 108 107 99 99 101  CO2 23 24 21* 21* 24  GLUCOSE 111* 130* 98 106* 93  BUN 12 13 11 18  26*  CREATININE 0.68 0.55 0.39* 0.68 0.64  CALCIUM 7.9* 8.8* 8.1* 7.8* 7.8*  MG  --   --   --  1.8 1.9  PHOS  --   --   --  4.2 3.6    GFR: Estimated Creatinine Clearance: 69.3 mL/min (by C-G formula based on SCr of 0.64 mg/dL).  Liver Function Tests: Recent Labs  Lab 04/18/22 0528 04/19/22 0500 04/20/22 0500  04/21/22 0542 04/22/22 0849  AST 140* 148* 212* 148* 101*  ALT 93* 106* 136* 111* 91*  ALKPHOS 168* 190* 223* 168* 150*  BILITOT 7.8* 12.7* 19.1* 20.7* 20.0*  PROT 5.5* 6.1* 6.0* 5.5* 5.1*  ALBUMIN 2.7* 2.8* 2.7* 2.4* 2.0*    CBG: No results for input(s): "GLUCAP" in the last 168 hours.   No results found for this or any previous visit (from the past 240 hour(s)).       Radiology Studies: IR PERCUTANEOUS TRANSHEPATIC CHOLANGIOGRAM  Result Date: 04/22/2022 INDICATION: 72 year old woman with history metastatic rectal cancer causing biliary obstruction presents to IR for percutaneous cholangiogram and drain placement. Patient's bilirubin did not significantly improve status post ERCP and stent placement due to high position of obstructing hepatic metastasis. EXAM:  Percutaneous transhepatic cholangiogram and biliary drain placement MEDICATIONS: Patient currently on inpatient regiment of antibiotics. ANESTHESIA/SEDATION: Moderate (conscious) sedation was employed during this procedure. Deveion Denz total of Versed 4 mg and Fentanyl 200 mcg was administered intravenously by the radiology nurse. Total intra-service moderate Sedation Time: 63 minutes. The patient's level of consciousness and vital signs were monitored continuously by radiology nursing throughout the procedure under my direct supervision. FLUOROSCOPY: Radiation Exposure Index (as provided by the fluoroscopic device): [id number] mGy Kerma COMPLICATIONS: None immediate. PROCEDURE: Informed written consent was obtained from the patient after Eowyn Tabone thorough discussion of the procedural risks, benefits and alternatives. All questions were addressed. Maximal Sterile Barrier Technique was utilized including caps, mask, sterile gowns, sterile gloves, sterile drape, hand hygiene and skin antiseptic. Zhoe Catania timeout was performed prior to the initiation of the procedure. Patient positioned supine on the procedure table. The right upper quadrant and epigastric skin prepped  and draped in usual fashion. Sonographic evaluation of the liver demonstrated minimal biliary dilatation in the right hepatic lobe. The atrophic left hepatic lobe could not be visualized with ultrasound. Following local lidocaine administration, multiple attempts were made to access bile ducts in the right hepatic lobe with Mende Biswell 21 gauge needle, however most are unsuccessful. Access was particularly difficult due to overlying ribs and high position of liver. 21 gauge needle was advanced into the right hepatic lobe using ultrasound guidance and slowly retracted under fluoroscopy until bile return was noted. Contrast administered through the 21 gauge needle opacified the right hepatic ducts and showed patent common bile duct. Given the central location of the accessed duct, decision was made to access Florestine Carmical more peripheral right hepatic lobe duct utilizing Drevin Ortner 21 gauge needle. 21 gauge needle exchanged for transitional dilator set over 0.018 inch guidewire. Kumpe catheter and glidewire were advanced successfully across the right lobe bile duct stenosis, into the CBD, and eventually into the doudenum. Tract dilation was performed, however the internal external biliary drain could not be advanced into the liver due to the angle of access. Continuous bleeding was seen along the access tract when the catheter or drain were not in place. This is likely due to hemorrhage from an intercostal vessel. No pulsatile flow was identified. With drain across the access tract, there was no hemorrhage. 5 mL of lidocaine with epi was administered along the tract and pursestring suture was applied which showed no hemorrhage. Since internal external biliary drain could not be advanced, decision was made to leave on external biliary drain in the right hepatic lobe. 8.5 Leonides Schanz drain was formed in the right hepatic bile ducts. Drain was secured to skin with suture and connected to bag. IMPRESSION: 1. Successful insertion of 8.5 French  Dawson Mueller external biliary drain in right hepatic bile duct. 2. Placement was difficult due to position of patient's liver and minimal bile duct dilatation. 3. Slow continuous bleeding was present along the access tract when drain or catheter were not in place, likely originating from intercostal vessel. This was treated with lidocaine with epi and pursestring suture. With drain in place, there was no hemorrhage along the tract. PLAN: I suspect that the patient's hyperbilirubinemia is not due to biliary obstruction given that the common bile duct was patent to the level of the doudenum on percutaneous transhepatic cholangiogram. If the patient's bilirubinemia significantly improves with external drainage, internalization can be considered. Electronically Signed   By: Miachel Roux M.D.   On: 04/22/2022 07:43   DG C-Arm 1-60 Min-No Report  Result Date: 04/21/2022 Fluoroscopy was utilized by the requesting physician.  No radiographic interpretation.        Scheduled Meds:  Chlorhexidine Gluconate Cloth  6 each Topical Daily   feeding supplement  1 Container Oral TID BM   levothyroxine  50 mcg Oral Daily   predniSONE  5 mg Oral Q breakfast   Continuous Infusions:  ampicillin-sulbactam (UNASYN) IV 1.5 g (04/22/22 1246)     LOS: 5 days    Time spent: over 30 min    Fayrene Helper, MD [facility]   To contact the attending provider between 7A-7P or the covering provider during after hours 7P-7A, please log into the web site [email or web address] and access using universal [place] password for that web site. If you do not have the password, please call the hospital operator.  04/22/2022, 1:47 PM

## 2022-04-22 NOTE — Progress Notes (Signed)
   Patient Name: Samantha Lutz Date of Encounter: 04/22/2022, 9:11 AM     Assessment and Plan  Biliary obstruction at hilum due to metastatic rectal cancer Post-ERCP pancreatitis  S/p R PBD yesterday - images reviewed Pancreatitis clinically resolved  ----------------------------------------------------------------------------------------------------------------  May take a few days to see bili reduction - just got drain yesterday PM so not surprised it is similar  - may not need left drain since that lobe is atrophic Will advance diet as tol Signing off but call back if ?'s       Subjective  Some RUQ pain w/ mvt Abd nontender except at drain I removed biliary stent yesterday and she got PBD   Lab Results  Component Value Date   LIPASE 162 (H) 04/21/2022      Objective  BP (!) 120/55 (BP Location: Right Arm)   Pulse 86   Temp 98.6 F (37 C) (Oral)   Resp 14   Ht 5\' 3"  (1.6 m)   Wt 96.7 kg   LMP  (LMP Unknown)   SpO2 93%   BMI 37.76 kg/m  Jaundiced NAD PBD RUQ - bandaged, dark bile in bag Abd somewhat tender near drain o/w nontender  Recent Labs  Lab 04/17/22 0841 04/18/22 0528 04/19/22 0500 04/20/22 0500 04/21/22 0542  AST 134* 140* 148* 212* 148*  ALT 103* 93* 106* 136* 111*  ALKPHOS 215* 168* 190* 223* 168*  BILITOT 8.0* 7.8* 12.7* 19.1* 20.7*  PROT 6.4* 5.5* 6.1* 6.0* 5.5*  ALBUMIN 3.5 2.7* 2.8* 2.7* 2.4*  INR 1.1  --   --   --   --      Gatha Mayer, MD, [facility] [place] [facility] See AMION on call - gastroenterology for best contact person 04/22/2022 9:11 AM

## 2022-04-22 NOTE — Progress Notes (Addendum)
Referring Physician(s): Roni Bread   Patient Status:  [facility] - In-pt  Chief Complaint: Biliary obstruction  Subjective:  Metastatic rectal cancer with obstructive jaundice and s/p PTC and external biliary drain on 04/21/22.  Patient has less pain than yesterday after the procedure.  She mentioned multiple times that the procedure was painful and will need additional or different anesthesia if we need to repeat the biliary procedure.   Allergies: Epinephrine, Losartan, Latex, and Vectibix [panitumumab]  Medications: Prior to Admission medications   Medication Sig Start Date End Date Taking? Authorizing Provider  albuterol (PROVENTIL HFA) 108 (90 Base) MCG/ACT inhaler Inhale 2 puffs into the lungs every 6 (six) hours as needed for wheezing or shortness of breath. 08/03/20  Yes Owens Shark, NP  bisacodyl (DULCOLAX) 5 MG EC tablet Take 5 mg by mouth daily as needed for mild constipation or moderate constipation.   Yes [provider]  levothyroxine (SYNTHROID) 50 MCG tablet Take 1 tablet by mouth once daily Patient taking differently: Take 50 mcg by mouth daily before breakfast. 02/06/22  Yes Ladell Pier, MD  lidocaine-prilocaine (EMLA) cream Apply 1 Application topically as needed. Apply to port site 1-2 hours prior to use Patient taking differently: Apply 1 Application topically as needed (for port access- one to two hours prior to accessing). 12/12/21  Yes Owens Shark, NP  magic mouthwash SOLN Take 5 mLs by mouth 4 (four) times daily as needed for mouth pain. 5 ml swish and spit 4 times a daily as needed for mouth pain 09/14/20  Yes Ladell Pier, MD  magnesium oxide (MAG-OX) 400 (240 Mg) MG tablet Take 1 tablet (400 mg total) by mouth 2 (two) times daily. 02/22/22  Yes Ladell Pier, MD  Nutritional Supplements (ENSURE ORIGINAL) LIQD Take 237 mLs by mouth See admin instructions. Drink 237 ml's (1 CHOCOLATE shake) by mouth once a day   Yes [provider]  potassium chloride (MICRO-K) 10 MEQ CR capsule Take 10 mEq by mouth daily.   Yes [provider]  predniSONE (DELTASONE) 5 MG tablet Take 1 tablet by mouth once daily with breakfast 03/15/22  Yes Ladell Pier, MD  prochlorperazine (COMPAZINE) 10 MG tablet Take 1 tablet (10 mg total) by mouth every 6 (six) hours as needed for nausea or vomiting. 03/01/21  Yes Owens Shark, NP  SYSTANE ULTRA PF 0.4-0.3 % SOLN Place 1 drop into both eyes 3 (three) times daily as needed (for dryness).   Yes [provider]  nystatin (MYCOSTATIN/NYSTOP) powder Apply 1 Application topically 3 (three) times daily. Patient not taking: Reported on 04/17/2022 12/12/21   Owens Shark, NP  Spacer/Aero-Holding Chambers (AEROCHAMBER MV) inhaler Use as instructed 12/31/19   Ladell Pier, MD  trifluridine-tipiracil (LONSURF) 20-8.19 MG tablet Take 2 tablets (40 mg Trifluridine) twice daily, take within 1 hr after AM & PM meals on days 1-5, 8-12. Repeat every 28 days. Start cycle on 04/10/22 Patient not taking: Reported on 04/17/2022 04/04/22   Ladell Pier, MD     Vital Signs: BP 134/64 (BP Location: Right Arm)   Pulse 83   Temp 98.2 F (36.8 C) (Oral)   Resp 18   Ht 5\' 3"  (1.6 m)   Wt 213 lb 3 oz (96.7 kg)   LMP  (LMP Unknown)   SpO2 93%   BMI 37.76 kg/m   Physical Exam Constitutional:      Comments: Jaundice  Abdominal:  General: There is no distension.     Palpations: Abdomen is soft.     Tenderness: There is abdominal tenderness.     Comments: Biliary drain intact, light red fluid in gravity bag.  Neurological:     Mental Status: She is alert.     Imaging: IR PERCUTANEOUS TRANSHEPATIC CHOLANGIOGRAM  Result Date: 04/22/2022 INDICATION: 72 year old woman with history metastatic rectal cancer causing biliary obstruction presents to IR for percutaneous cholangiogram and drain placement. Patient's bilirubin did not significantly improve status post ERCP and stent  placement due to high position of obstructing hepatic metastasis. EXAM: Percutaneous transhepatic cholangiogram and biliary drain placement MEDICATIONS: Patient currently on inpatient regiment of antibiotics. ANESTHESIA/SEDATION: Moderate (conscious) sedation was employed during this procedure. A total of Versed 4 mg and Fentanyl 200 mcg was administered intravenously by the radiology nurse. Total intra-service moderate Sedation Time: 63 minutes. The patient's level of consciousness and vital signs were monitored continuously by radiology nursing throughout the procedure under my direct supervision. FLUOROSCOPY: Radiation Exposure Index (as provided by the fluoroscopic device): 123456 mGy Kerma COMPLICATIONS: None immediate. PROCEDURE: Informed written consent was obtained from the patient after a thorough discussion of the procedural risks, benefits and alternatives. All questions were addressed. Maximal Sterile Barrier Technique was utilized including caps, mask, sterile gowns, sterile gloves, sterile drape, hand hygiene and skin antiseptic. A timeout was performed prior to the initiation of the procedure. Patient positioned supine on the procedure table. The right upper quadrant and epigastric skin prepped and draped in usual fashion. Sonographic evaluation of the liver demonstrated minimal biliary dilatation in the right hepatic lobe. The atrophic left hepatic lobe could not be visualized with ultrasound. Following local lidocaine administration, multiple attempts were made to access bile ducts in the right hepatic lobe with a 21 gauge needle, however most are unsuccessful. Access was particularly difficult due to overlying ribs and high position of liver. 21 gauge needle was advanced into the right hepatic lobe using ultrasound guidance and slowly retracted under fluoroscopy until bile return was noted. Contrast administered through the 21 gauge needle opacified the right hepatic ducts and showed patent common  bile duct. Given the central location of the accessed duct, decision was made to access a more peripheral right hepatic lobe duct utilizing a 21 gauge needle. 21 gauge needle exchanged for transitional dilator set over 0.018 inch guidewire. Kumpe catheter and glidewire were advanced successfully across the right lobe bile duct stenosis, into the CBD, and eventually into the doudenum. Tract dilation was performed, however the internal external biliary drain could not be advanced into the liver due to the angle of access. Continuous bleeding was seen along the access tract when the catheter or drain were not in place. This is likely due to hemorrhage from an intercostal vessel. No pulsatile flow was identified. With drain across the access tract, there was no hemorrhage. 5 mL of lidocaine with epi was administered along the tract and pursestring suture was applied which showed no hemorrhage. Since internal external biliary drain could not be advanced, decision was made to leave on external biliary drain in the right hepatic lobe. 8.5 Leonides Schanz drain was formed in the right hepatic bile ducts. Drain was secured to skin with suture and connected to bag. IMPRESSION: 1. Successful insertion of 8.5 French Dawson Mueller external biliary drain in right hepatic bile duct. 2. Placement was difficult due to position of patient's liver and minimal bile duct dilatation. 3. Slow continuous bleeding was present along the access  tract when drain or catheter were not in place, likely originating from intercostal vessel. This was treated with lidocaine with epi and pursestring suture. With drain in place, there was no hemorrhage along the tract. PLAN: I suspect that the patient's hyperbilirubinemia is not due to biliary obstruction given that the common bile duct was patent to the level of the doudenum on percutaneous transhepatic cholangiogram. If the patient's bilirubinemia significantly improves with external drainage,  internalization can be considered. Electronically Signed   By: Miachel Roux M.D.   On: 04/22/2022 07:43   DG C-Arm 1-60 Min-No Report  Result Date: 04/21/2022 Fluoroscopy was utilized by the requesting physician.  No radiographic interpretation.   MR ABDOMEN MRCP W WO CONTAST  Result Date: 04/20/2022 CLINICAL DATA:  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 Heavily T2-weighted images of the biliary and pancreatic ducts were obtained, and three-dimensional MRCP images were rendered by post processing. CONTRAST:  37mL GADAVIST GADOBUTROL 1 MMOL/ML IV SOLN COMPARISON:  ERCP from 04/18/2022 and MRI abdomen through 04/14/2022 FINDINGS: Despite efforts by the technologist and patient, motion artifact is present on today's exam and could not be eliminated. This reduces exam sensitivity and specificity. Lower chest: Stable bilateral pulmonary nodules and right basilar atelectasis along with small right pleural effusion. Mild cardiomegaly. Uphill paraesophageal varices. Hepatobiliary: Dependent gallstones in the gallbladder. The biliary stent is difficult to visualize and is probably best seen on the axial postcontrast images extending from the duodenum up to the biliary tree to the confluence of the right and left hepatic ducts. The stent is not well visualized extending into either the right or left bile duct, and seems to terminate in the vicinity of a central mass which is near the confluence of the right and left hepatic ducts. This mass has indistinct borders complicated by differences in the degree of enhancement of the left hepatic lobe in a right hepatic lobe, with delayed enhancement of the left hepatic lobe compared to the right, but is thought  to measure at least about 4.0 by 1.7 cm on image 30 of series 17. There is mild right (particularly right posterior) and left intrahepatic biliary dilatation indicating that the stent may possibly not be fully traversing the mass, or may only be partially traversing the mass. The degree of left hepatic lobe biliary dilatation is mildly improved from 04/14/2022 but not resolved. No well-defined left portal vein enhancement, suspicious for left portal vein thrombosis. Pancreas: Mild pancreatic and peripancreatic edema, correlate with lipase levels to assess for pancreatitis. Spleen: The spleen measures 12.5 by 5.3 by 9.9 cm (volume = 340 cm^3). No significant focal splenic lesion. Adrenals/Urinary Tract:  Unremarkable Stomach/Bowel: Unremarkable Vascular/Lymphatic: Varices along the gastric fundus. Suspicion for left portal vein thrombosis as noted in the liver section above. The right portal vein, main portal vein, splenic vein, and SMV appear patent. Esophageal and gastric varices indicative of portal venous hypertension. Abdominal aortic atherosclerosis. Other:  Trace fluid in the left paracolic gutter. Musculoskeletal: Suspected superior endplate compression fracture at T8, not readily visible on 01/25/2022, likely subacute. IMPRESSION: 1. The biliary stent appears to extend from the duodenum to the vicinity of a central mass near the confluence of the right and left hepatic ducts. There is mild right and left intrahepatic biliary dilatation indicating that the  stent may possibly not be fully traversing the mass, or may only be partially traversing the mass. The degree of left hepatic lobe biliary dilatation is mildly improved from 04/14/2022 but not resolved. 2. No well-defined enhancement in the left portal vein, suspicious for left portal vein thrombosis. 3. Mild pancreatic and peripancreatic edema, correlate with lipase levels to assess for pancreatitis. 4. Stable bilateral pulmonary nodules and right basilar  atelectasis along with small right pleural effusion. Mild cardiomegaly. 5. Superior endplate compression fracture at T8, not readily visible on 01/25/2022, likely subacute. 6. Uphill paraesophageal and gastric varices indicating portal venous hypertension. 7. Trace fluid in the left paracolic gutter. 8. Cholelithiasis. Electronically Signed   By: Van Clines M.D.   On: 04/20/2022 09:59   MR 3D Recon At Scanner  Result Date: 04/20/2022 CLINICAL DATA:  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 Heavily T2-weighted images of the biliary and pancreatic ducts were obtained, and three-dimensional MRCP images were rendered by post processing. CONTRAST:  58mL GADAVIST GADOBUTROL 1 MMOL/ML IV SOLN COMPARISON:  ERCP from 04/18/2022 and MRI abdomen through 04/14/2022 FINDINGS: Despite efforts by the technologist and patient, motion artifact is present on today's exam and could not be eliminated. This reduces exam sensitivity and specificity. Lower chest: Stable bilateral pulmonary nodules and right basilar atelectasis along with small right pleural effusion. Mild cardiomegaly. Uphill paraesophageal varices. Hepatobiliary: Dependent gallstones in the gallbladder. The biliary stent is difficult to visualize and is probably best seen on the axial postcontrast images extending from the duodenum up to the biliary tree to the confluence of the right and left hepatic ducts. The stent is not well visualized extending into either the right or left bile duct, and seems to terminate in the vicinity of a central mass which is near the confluence of the right and left hepatic ducts. This mass has indistinct borders complicated by differences in the degree of  enhancement of the left hepatic lobe in a right hepatic lobe, with delayed enhancement of the left hepatic lobe compared to the right, but is thought to measure at least about 4.0 by 1.7 cm on image 30 of series 17. There is mild right (particularly right posterior) and left intrahepatic biliary dilatation indicating that the stent may possibly not be fully traversing the mass, or may only be partially traversing the mass. The degree of left hepatic lobe biliary dilatation is mildly improved from 04/14/2022 but not resolved. No well-defined left portal vein enhancement, suspicious for left portal vein thrombosis. Pancreas: Mild pancreatic and peripancreatic edema, correlate with lipase levels to assess for pancreatitis. Spleen: The spleen measures 12.5 by 5.3 by 9.9 cm (volume = 340 cm^3). No significant focal splenic lesion. Adrenals/Urinary Tract:  Unremarkable Stomach/Bowel: Unremarkable Vascular/Lymphatic: Varices along the gastric fundus. Suspicion for left portal vein thrombosis as noted in the liver section above. The right portal vein, main portal vein, splenic vein, and SMV appear patent. Esophageal and gastric varices indicative of portal venous hypertension. Abdominal aortic atherosclerosis. Other:  Trace fluid in the left paracolic gutter. Musculoskeletal: Suspected superior endplate compression fracture at T8, not readily visible on 01/25/2022, likely subacute. IMPRESSION: 1. The biliary stent appears to extend from the duodenum to the vicinity of a central mass near the confluence of the right and left hepatic ducts. There  is mild right and left intrahepatic biliary dilatation indicating that the stent may possibly not be fully traversing the mass, or may only be partially traversing the mass. The degree of left hepatic lobe biliary dilatation is mildly improved from 04/14/2022 but not resolved. 2. No well-defined enhancement in the left portal vein, suspicious for left portal vein thrombosis. 3. Mild  pancreatic and peripancreatic edema, correlate with lipase levels to assess for pancreatitis. 4. Stable bilateral pulmonary nodules and right basilar atelectasis along with small right pleural effusion. Mild cardiomegaly. 5. Superior endplate compression fracture at T8, not readily visible on 01/25/2022, likely subacute. 6. Uphill paraesophageal and gastric varices indicating portal venous hypertension. 7. Trace fluid in the left paracolic gutter. 8. Cholelithiasis. Electronically Signed   By: Van Clines M.D.   On: 04/20/2022 09:59   DG ERCP  Result Date: 04/18/2022 CLINICAL DATA:  Liver mass. EXAM: ERCP TECHNIQUE: Multiple spot images obtained with the fluoroscopic device and submitted for interpretation post-procedure. FLUOROSCOPY TIME: FLUOROSCOPY TIME 30 minutes, 40 seconds (434.4 mGy) COMPARISON:  CT abdomen pelvis-04/13/2022; abdominal MRI-04/14/2022 FINDINGS: Ten spot intraoperative fluoroscopic images of the right upper abdominal quadrant during ERCP are provided for review Initial image demonstrates an ERCP probe overlying the right upper abdominal quadrant. Subsequent images demonstrate selective cannulation and opacification of the common bile duct which appears nondilated. There is tapered narrowing/subtotal occlusion of the common hepatic duct at the level of the biliary hilum with subsequent opacification of the left intrahepatic biliary system and ultimately with internal biliary stent placement. There is minimal opacification of the cystic duct with partial opacification of the gallbladder. IMPRESSION: ERCP with suspected malignant subtotal occlusion of the intrahepatic biliary tree at the level of the biliary hilum with subsequent biliary stent placement. These images were submitted for radiologic interpretation only. Please see the procedural report for the amount of contrast and the fluoroscopy time utilized. Electronically Signed   By: Sandi Mariscal M.D.   On: 04/18/2022 15:12   DG  C-Arm 1-60 Min-No Report  Result Date: 04/18/2022 Fluoroscopy was utilized by the requesting physician.  No radiographic interpretation.    Labs:  CBC: Recent Labs    04/19/22 0500 04/20/22 0500 04/21/22 0542 04/22/22 0849  WBC 5.9 11.6* 17.7* 10.7*  HGB 10.5* 11.6* 10.5* 9.3*  HCT 31.9* 34.6* 31.3* 27.9*  PLT 106* 102* 85* 86*    COAGS: Recent Labs    04/17/22 0841  INR 1.1    BMP: Recent Labs    04/19/22 0500 04/20/22 0500 04/21/22 0542 04/22/22 0849  NA 140 130* 129* 133*  K 4.3 4.1 4.1 3.8  CL 107 99 99 101  CO2 24 21* 21* 24  GLUCOSE 130* 98 106* 93  BUN 13 11 18  26*  CALCIUM 8.8* 8.1* 7.8* 7.8*  CREATININE 0.55 0.39* 0.68 0.64  GFRNONAA >60 >60 >60 >60    LIVER FUNCTION TESTS: Recent Labs    04/19/22 0500 04/20/22 0500 04/21/22 0542 04/22/22 0849  BILITOT 12.7* 19.1* 20.7* 20.0*  AST 148* 212* 148* 101*  ALT 106* 136* 111* 91*  ALKPHOS 190* 223* 168* 150*  PROT 6.1* 6.0* 5.5* 5.1*  ALBUMIN 2.8* 2.7* 2.4* 2.0*    Assessment and Plan:  #1 Obstructive jaundice:  Status post placement of right external biliary drain.  Reportedly, only 30 ml of output since drain placement.  Bilirubin has not changed since placement of the drain.  Hgb slightly decreased after drain placement and not unexpected but need to follow.  Will continue  to follow labs and drain output.  Will likely need to internalize biliary drain next week.  IR to follow.       I spent a total of 15 Minutes at the the patient's bedside AND on the patient's hospital floor or unit, greater than 50% of which was counseling/coordinating care for the biliary drain.

## 2022-04-23 DIAGNOSIS — K831 Obstruction of bile duct: Secondary | ICD-10-CM | POA: Diagnosis not present

## 2022-04-23 LAB — CBC WITH DIFFERENTIAL/PLATELET
Abs Immature Granulocytes: 0.09 10*3/uL — ABNORMAL HIGH (ref 0.00–0.07)
Basophils Absolute: 0 10*3/uL (ref 0.0–0.1)
Basophils Relative: 0 %
Eosinophils Absolute: 0.1 10*3/uL (ref 0.0–0.5)
Eosinophils Relative: 2 %
HCT: 25.8 % — ABNORMAL LOW (ref 36.0–46.0)
Hemoglobin: 8.5 g/dL — ABNORMAL LOW (ref 12.0–15.0)
Immature Granulocytes: 1 %
Lymphocytes Relative: 6 %
Lymphs Abs: 0.4 10*3/uL — ABNORMAL LOW (ref 0.7–4.0)
MCH: 34 pg (ref 26.0–34.0)
MCHC: 32.9 g/dL (ref 30.0–36.0)
MCV: 103.2 fL — ABNORMAL HIGH (ref 80.0–100.0)
Monocytes Absolute: 1 10*3/uL (ref 0.1–1.0)
Monocytes Relative: 14 %
Neutro Abs: 5.5 10*3/uL (ref 1.7–7.7)
Neutrophils Relative %: 77 %
Platelets: 79 10*3/uL — ABNORMAL LOW (ref 150–400)
RBC: 2.5 MIL/uL — ABNORMAL LOW (ref 3.87–5.11)
RDW: 19.7 % — ABNORMAL HIGH (ref 11.5–15.5)
WBC: 7.1 10*3/uL (ref 4.0–10.5)
nRBC: 0 % (ref 0.0–0.2)

## 2022-04-23 LAB — HEMOGLOBIN AND HEMATOCRIT, BLOOD
HCT: 26.1 % — ABNORMAL LOW (ref 36.0–46.0)
Hemoglobin: 8.6 g/dL — ABNORMAL LOW (ref 12.0–15.0)

## 2022-04-23 LAB — COMPREHENSIVE METABOLIC PANEL
ALT: 73 U/L — ABNORMAL HIGH (ref 0–44)
AST: 81 U/L — ABNORMAL HIGH (ref 15–41)
Albumin: 1.9 g/dL — ABNORMAL LOW (ref 3.5–5.0)
Alkaline Phosphatase: 142 U/L — ABNORMAL HIGH (ref 38–126)
Anion gap: 7 (ref 5–15)
BUN: 26 mg/dL — ABNORMAL HIGH (ref 8–23)
CO2: 25 mmol/L (ref 22–32)
Calcium: 7.9 mg/dL — ABNORMAL LOW (ref 8.9–10.3)
Chloride: 102 mmol/L (ref 98–111)
Creatinine, Ser: 0.61 mg/dL (ref 0.44–1.00)
GFR, Estimated: >60 mL/min (ref >60)
Glucose, Bld: 98 mg/dL (ref 70–99)
Potassium: 3.5 mmol/L (ref 3.5–5.1)
Sodium: 134 mmol/L — ABNORMAL LOW (ref 135–145)
Total Bilirubin: 18.7 mg/dL (ref 0.3–1.2)
Total Protein: 4.9 g/dL — ABNORMAL LOW (ref 6.5–8.1)

## 2022-04-23 LAB — MAGNESIUM: Magnesium: 2.1 mg/dL (ref 1.7–2.4)

## 2022-04-23 LAB — PHOSPHORUS: Phosphorus: 3.7 mg/dL (ref 2.5–4.6)

## 2022-04-23 NOTE — Progress Notes (Signed)
Interventional Radiology Brief Note:  Called to unit and spoke with RN. No complaints overnight.  Biliary drain without issue, 52mL output overnight. Tbili down to 18. Vital signs otherwise stable.   Continue to monitor.    Brynda Greathouse [facility] Rad[facility].

## 2022-04-23 NOTE — Progress Notes (Addendum)
PROGRESS NOTE    [place] COURT[place]  [id number] DOB: Jul 25, 1950 DOA: 04/17/2022 PCP: Andre Lefort, FNP  No chief complaint on file.   Brief Narrative:   Ms. Saravia is Samantha Lutz 72 yo female with PMH rectal cancer with liver metastasis who presented with mild abdominal discomfort and outpatient elevated total bilirubin.  This has been trended and found to not be resolving.  MRI liver was obtained on 04/14/2022 which showed underlying hypoenhancing central liver mass measuring 2.7 x 1.9 cm compatible with solitary recurrent liver metastasis.  Also showed underlying associated high-grade malignant biliary hilar stricture.  She was admitted for GI evaluation and expedited endoscopy for further evaluation.  Assessment & Plan:   Principal Problem:   Biliary obstruction Active Problems:   Rectal cancer ypT3ypN1a (1/23 LN) s/p neoadj chemoXRT, robotic LAR resection & diverting loop ileostomy 12/21/2017   Hypertension   Hypothyroid   Post-ERCP acute pancreatitis   Disorder of bile duct stent  Biliary obstruction - Underwent MRI liver on 04/14/2022 showing high-grade malignant biliary hilar stricture - s/p ERCP 3/19 with localized biliary strictures found in the hepatitic duct system from extrahepatic mestastasis.  Strictures malignant appearing.  Treated with stent placement.  - unasyn, per GI  - MRCP with biliary stent extending from duodenum to vicinity of central mass near the confluence of the R and L hepatic ducts - mild right and L intrahepatic biliary dilatation indicates that the stent may possibly not be transversing the mass or maybe only partially transversing the mass - L hepatic lobe biliary dilatation is mildly improved but not resolved.   - bili mildly downtrending, AST/ALT/alk phos downtrending - removal of biliary stent 3/22 - s/p external biliary drain in right hepatic bile duct 3/22, difficult placement, bleeding when drain/catheter not in place (due to intercostal vessel),  treated with liod with epi and pursestring suture - no bleeding with drain in place.  Per Rads, concern that hyperbili not due to biliary obstruction as CBD patent to level of duodenum, consider internalization if bilirubin improves with external drainage. - GI now signed off, may take time for bili to normalize  Post ERCP Pancreatitis - seems improved, will follow    Anemia - downtrending, will follow  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 Vein Thrombosis - no need for anticoagulation per discussion with oncology  Leukocytosis  Fever - due to above, continue abx - improving - last fever 3/21 PM  Hyponatremia - improved, hold further LR - will continue to monitor  Rectal cancer ypT3ypN1a (1/23 LN) s/p neoadj chemoXRT, robotic LAR resection & diverting loop ileostomy 12/21/2017 - Follows closely outpatient with oncology; has been needed with Lons[facility]f; cycle 2 placed on hold recently due to LFT's   Hypothyroid - Continue Synthroid   Hypertension - recently more elevated - will continue to monitor for now, consider adding meds (treat pain, discomfort first)  T8 Compression Fracture - noted, follow    DVT prophylaxis: lovenox Code Status: full Family Communication: none Disposition:   Status is: Inpatient Remains inpatient appropriate because: worsening LFT's, further GI recs pending   Consultants:  GI  Procedures:  ERCP  Antimicrobials:  Anti-infectives (From admission, onward)    Start     Dose/Rate Route Frequency Ordered Stop   04/18/22 1230  ampicillin-sulbactam (UNASYN) 1.5 g in sodium chloride 0.9 % 100 mL IVPB  Status:  Discontinued        1.5 g 200 mL/hr over 30 Minutes Intravenous  Once 04/18/22 1227 04/18/22 1531   04/18/22 1100  ampicillin-sulbactam (UNASYN) 1.5 g in sodium chloride 0.9 % 100 mL IVPB        1.5 g 200 mL/hr over 30 Minutes Intravenous Every 6 hours 04/18/22 0958         Subjective: No complaints  Objective: Vitals:   04/22/22 0542 04/22/22  1315 04/22/22 2135 04/23/22 0530  BP: (!) 120/55 134/64 118/61 129/61  Pulse: 86 83 84 78  Resp: 14 18 14 14   Temp: 98.6 F (37 C) 98.2 F (36.8 C) 97.8 F (36.6 C) 98.1 F (36.7 C)  TempSrc: Oral Oral Oral Oral  SpO2: 93% 93% 92% 93%  Weight: 96.7 kg   97.6 kg  Height:        Intake/Output Summary (Last 24 hours) at 04/23/2022 1120 Last data filed at 04/23/2022 0600 Gross per 24 hour  Intake 540 ml  Output 70 ml  Net 470 ml   Filed Weights   04/21/22 1400 04/22/22 0542 04/23/22 0530  Weight: 95.5 kg 96.7 kg 97.6 kg    Examination:  General: No acute distress. jaundiced Cardiovascular: RRR Lungs: unlabored Abdomen: Soft, nontender, nondistended - drain with brownish output - blood/bruising around drain site Neurological: Alert and oriented 3. Moves all extremities 4 with equal strength. Cranial nerves II through XII grossly intact. Extremities: No clubbing or cyanosis. No edema.   Data Reviewed: I have personally reviewed following labs and imaging studies  CBC: Recent Labs  Lab 04/17/22 1645 04/17/22 1934 04/19/22 0500 04/20/22 0500 04/21/22 0542 04/22/22 0849 04/23/22 0542  WBC 5.0   < > 5.9 11.6* 17.7* 10.7* 7.1  NEUTROABS 3.6  --  5.1  --  14.2* 9.1* 5.5  HGB 10.7*   < > 10.5* 11.6* 10.5* 9.3* 8.5*  HCT 32.7*   < > 31.9* 34.6* 31.3* 27.9* 25.8*  MCV 102.5*   < > 102.6* 100.6* 101.6* 103.7* 103.2*  PLT 111*   < > 106* 102* 85* 86* 79*   < > = values in this interval not displayed.    Basic Metabolic Panel: Recent Labs  Lab 04/19/22 0500 04/20/22 0500 04/21/22 0542 04/22/22 0849 04/23/22 0542  NA 140 130* 129* 133* 134*  K 4.3 4.1 4.1 3.8 3.5  CL 107 99 99 101 102  CO2 24 21* 21* 24 25  GLUCOSE 130* 98 106* 93 98  BUN 13 11 18  26* 26*  CREATININE 0.55 0.39* 0.68 0.64 0.61  CALCIUM 8.8* 8.1* 7.8* 7.8* 7.9*  MG  --   --  1.8 1.9 2.1  PHOS  --   --  4.2 3.6 3.7    GFR: Estimated Creatinine Clearance: 69.7 mL/min (by C-G formula based on  SCr of 0.61 mg/dL).  Liver Function Tests: Recent Labs  Lab 04/19/22 0500 04/20/22 0500 04/21/22 0542 04/22/22 0849 04/23/22 0542  AST 148* 212* 148* 101* 81*  ALT 106* 136* 111* 91* 73*  ALKPHOS 190* 223* 168* 150* 142*  BILITOT 12.7* 19.1* 20.7* 20.0* 18.7*  PROT 6.1* 6.0* 5.5* 5.1* 4.9*  ALBUMIN 2.8* 2.7* 2.4* 2.0* 1.9*    CBG: No results for input(s): "GLUCAP" in the last 168 hours.   No results found for this or any previous visit (from the past 240 hour(s)).       Radiology Studies: IR PERCUTANEOUS TRANSHEPATIC CHOLANGIOGRAM  Result Date: 04/22/2022 INDICATION: 72 year old woman with history metastatic rectal cancer causing biliary obstruction presents to IR for percutaneous cholangiogram and drain placement. Patient's bilirubin did not significantly improve status post ERCP and stent placement due  to high position of obstructing hepatic metastasis. EXAM: Percutaneous transhepatic cholangiogram and biliary drain placement MEDICATIONS: Patient currently on inpatient regiment of antibiotics. ANESTHESIA/SEDATION: Moderate (conscious) sedation was employed during this procedure. Kirrah Mustin total of Versed 4 mg and Fentanyl 200 mcg was administered intravenously by the radiology nurse. Total intra-service moderate Sedation Time: 63 minutes. The patient's level of consciousness and vital signs were monitored continuously by radiology nursing throughout the procedure under my direct supervision. FLUOROSCOPY: Radiation Exposure Index (as provided by the fluoroscopic device): [id number] mGy Kerma COMPLICATIONS: None immediate. PROCEDURE: Informed written consent was obtained from the patient after Phoenyx Paulsen thorough discussion of the procedural risks, benefits and alternatives. All questions were addressed. Maximal Sterile Barrier Technique was utilized including caps, mask, sterile gowns, sterile gloves, sterile drape, hand hygiene and skin antiseptic. Tish Begin timeout was performed prior to the initiation of the  procedure. Patient positioned supine on the procedure table. The right upper quadrant and epigastric skin prepped and draped in usual fashion. Sonographic evaluation of the liver demonstrated minimal biliary dilatation in the right hepatic lobe. The atrophic left hepatic lobe could not be visualized with ultrasound. Following local lidocaine administration, multiple attempts were made to access bile ducts in the right hepatic lobe with Davida Falconi 21 gauge needle, however most are unsuccessful. Access was particularly difficult due to overlying ribs and high position of liver. 21 gauge needle was advanced into the right hepatic lobe using ultrasound guidance and slowly retracted under fluoroscopy until bile return was noted. Contrast administered through the 21 gauge needle opacified the right hepatic ducts and showed patent common bile duct. Given the central location of the accessed duct, decision was made to access Kalai Baca more peripheral right hepatic lobe duct utilizing Kharma Sampsel 21 gauge needle. 21 gauge needle exchanged for transitional dilator set over 0.018 inch guidewire. Kumpe catheter and glidewire were advanced successfully across the right lobe bile duct stenosis, into the CBD, and eventually into the doudenum. Tract dilation was performed, however the internal external biliary drain could not be advanced into the liver due to the angle of access. Continuous bleeding was seen along the access tract when the catheter or drain were not in place. This is likely due to hemorrhage from an intercostal vessel. No pulsatile flow was identified. With drain across the access tract, there was no hemorrhage. 5 mL of lidocaine with epi was administered along the tract and pursestring suture was applied which showed no hemorrhage. Since internal external biliary drain could not be advanced, decision was made to leave on external biliary drain in the right hepatic lobe. 8.5 Leonides Schanz drain was formed in the right hepatic bile  ducts. Drain was secured to skin with suture and connected to bag. IMPRESSION: 1. Successful insertion of 8.5 French Dawson Mueller external biliary drain in right hepatic bile duct. 2. Placement was difficult due to position of patient's liver and minimal bile duct dilatation. 3. Slow continuous bleeding was present along the access tract when drain or catheter were not in place, likely originating from intercostal vessel. This was treated with lidocaine with epi and pursestring suture. With drain in place, there was no hemorrhage along the tract. PLAN: I suspect that the patient's hyperbilirubinemia is not due to biliary obstruction given that the common bile duct was patent to the level of the doudenum on percutaneous transhepatic cholangiogram. If the patient's bilirubinemia significantly improves with external drainage, internalization can be considered. Electronically Signed   By: Miachel Roux M.D.   On: 04/22/2022 07:43  DG C-Arm 1-60 Min-No Report  Result Date: 04/21/2022 Fluoroscopy was utilized by the requesting physician.  No radiographic interpretation.        Scheduled Meds:  Chlorhexidine Gluconate Cloth  6 each Topical Daily   enoxaparin (LOVENOX) injection  40 mg Subcutaneous Q24H   feeding supplement  1 Container Oral TID BM   levothyroxine  50 mcg Oral Daily   predniSONE  5 mg Oral Q breakfast   Continuous Infusions:  ampicillin-sulbactam (UNASYN) IV 1.5 g (04/23/22 0531)     LOS: 6 days    Time spent: over 30 min    Fayrene Helper, MD [facility]   To contact the attending provider between 7A-7P or the covering provider during after hours 7P-7A, please log into the web site [email or web address] and access using universal Bell password for that web site. If you do not have the password, please call the hospital operator.  04/23/2022, 11:20 AM

## 2022-04-23 NOTE — Progress Notes (Signed)
Mobility Specialist - Progress Note   04/23/22 1151  Mobility  Activity Ambulated independently in hallway  Level of Assistance Independent  Assistive Device None  Distance Ambulated (ft) 500 ft  Activity Response Tolerated well  Mobility Referral Yes  $Mobility charge 1 Mobility   Pt received in bed and agreeable to mobility. No complaints during session. Pt to bed after session with all needs met.    [facility]

## 2022-04-24 DIAGNOSIS — K831 Obstruction of bile duct: Secondary | ICD-10-CM | POA: Diagnosis not present

## 2022-04-24 LAB — HEPATIC FUNCTION PANEL
ALT: 59 U/L — ABNORMAL HIGH (ref 0–44)
AST: 63 U/L — ABNORMAL HIGH (ref 15–41)
Albumin: 2 g/dL — ABNORMAL LOW (ref 3.5–5.0)
Alkaline Phosphatase: 156 U/L — ABNORMAL HIGH (ref 38–126)
Bilirubin, Direct: 7.5 mg/dL — ABNORMAL HIGH (ref 0.0–0.2)
Indirect Bilirubin: 5.3 mg/dL — ABNORMAL HIGH (ref 0.3–0.9)
Total Bilirubin: 12.8 mg/dL — ABNORMAL HIGH (ref 0.3–1.2)
Total Protein: 4.9 g/dL — ABNORMAL LOW (ref 6.5–8.1)

## 2022-04-24 LAB — BASIC METABOLIC PANEL
Anion gap: 10 (ref 5–15)
BUN: 20 mg/dL (ref 8–23)
CO2: 24 mmol/L (ref 22–32)
Calcium: 7.5 mg/dL — ABNORMAL LOW (ref 8.9–10.3)
Chloride: 100 mmol/L (ref 98–111)
Creatinine, Ser: 0.62 mg/dL (ref 0.44–1.00)
GFR, Estimated: >60 mL/min (ref >60)
Glucose, Bld: 88 mg/dL (ref 70–99)
Potassium: 3.4 mmol/L — ABNORMAL LOW (ref 3.5–5.1)
Sodium: 134 mmol/L — ABNORMAL LOW (ref 135–145)

## 2022-04-24 LAB — HEMOGLOBIN AND HEMATOCRIT, BLOOD
HCT: 24.9 % — ABNORMAL LOW (ref 36.0–46.0)
Hemoglobin: 8.3 g/dL — ABNORMAL LOW (ref 12.0–15.0)

## 2022-04-24 LAB — CBC
HCT: 24.5 % — ABNORMAL LOW (ref 36.0–46.0)
Hemoglobin: 8.1 g/dL — ABNORMAL LOW (ref 12.0–15.0)
MCH: 34 pg (ref 26.0–34.0)
MCHC: 33.1 g/dL (ref 30.0–36.0)
MCV: 102.9 fL — ABNORMAL HIGH (ref 80.0–100.0)
Platelets: 69 10*3/uL — ABNORMAL LOW (ref 150–400)
RBC: 2.38 MIL/uL — ABNORMAL LOW (ref 3.87–5.11)
RDW: 19.5 % — ABNORMAL HIGH (ref 11.5–15.5)
WBC: 3.8 10*3/uL — ABNORMAL LOW (ref 4.0–10.5)
nRBC: 0 % (ref 0.0–0.2)

## 2022-04-24 LAB — TYPE AND SCREEN
ABO/RH(D): A NEG
Antibody Screen: NEGATIVE

## 2022-04-24 LAB — PROTIME-INR
INR: 1.2 (ref 0.8–1.2)
Prothrombin Time: 15.1 seconds (ref 11.4–15.2)

## 2022-04-24 LAB — PHOSPHORUS: Phosphorus: 3.7 mg/dL (ref 2.5–4.6)

## 2022-04-24 LAB — MAGNESIUM: Magnesium: 2.2 mg/dL (ref 1.7–2.4)

## 2022-04-24 MED ORDER — SODIUM CHLORIDE 0.9% FLUSH
5.0000 mL | Freq: Three times a day (TID) | INTRAVENOUS | Status: DC
  Administered 2022-04-24 – 2022-05-04 (×14): 5 mL

## 2022-04-24 MED ORDER — POTASSIUM CHLORIDE CRYS ER 20 MEQ PO TBCR
40.0000 meq | EXTENDED_RELEASE_TABLET | Freq: Once | ORAL | Status: AC
  Administered 2022-04-24: 40 meq via ORAL
  Filled 2022-04-24: qty 2

## 2022-04-24 NOTE — Progress Notes (Signed)
PROGRESS NOTE    Samantha Lutz  [id number] DOB: [birth year] DOA: 04/17/2022 PCP: Andre Lefort, FNP  No chief complaint on file.   Brief Narrative:   [name] is Samantha Lutz 72 yo female with PMH rectal cancer with liver metastasis who presented with mild abdominal discomfort and outpatient elevated total bilirubin.  This has been trended and found to not be resolving.  MRI liver was obtained on 04/14/2022 which showed underlying hypoenhancing central liver mass measuring 2.7 x 1.9 cm compatible with solitary recurrent liver metastasis.  Also showed underlying associated high-grade malignant biliary hilar stricture.  She was admitted for GI evaluation and expedited endoscopy for further evaluation.  Assessment & Plan:   Principal Problem:   Biliary obstruction Active Problems:   Rectal cancer ypT3ypN1a (1/23 LN) s/p neoadj chemoXRT, robotic LAR resection & diverting loop ileostomy 12/21/2017   Hypertension   Hypothyroid   Post-ERCP acute pancreatitis   Disorder of bile duct stent  Biliary obstruction - Underwent MRI liver on 04/14/2022 showing high-grade malignant biliary hilar stricture - s/p ERCP 3/19 with localized biliary strictures found in the hepatitic duct system from extrahepatic mestastasis.  Strictures malignant appearing.  Treated with stent placement.  - unasyn, per GI  - MRCP with biliary stent extending from duodenum to vicinity of central mass near the confluence of the R and L hepatic ducts - mild right and L intrahepatic biliary dilatation indicates that the stent may possibly not be transversing the mass or maybe only partially transversing the mass - L hepatic lobe biliary dilatation is mildly improved but not resolved.   - bili mildly downtrending, AST/ALT/alk phos downtrending - removal of biliary stent 3/22 - s/p external biliary drain in right hepatic bile duct 3/22, difficult placement, bleeding when drain/catheter not in place (due to intercostal vessel),  treated with liod with epi and pursestring suture - no bleeding with drain in place.  Per Rads, concern that hyperbili not due to biliary obstruction as CBD patent to level of duodenum, consider internalization if bilirubin improves with external drainage. - bili downtrending  - GI now signed off, may take time for bili to normalize  Post ERCP Pancreatitis - seems improved, will follow    Anemia - downtrending, will follow - repeat H/H, type and screen   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 Vein Thrombosis - no need for anticoagulation per discussion with oncology  Leukocytosis  Fever - due to above, continue abx - improving - last fever 3/21 PM  Hyponatremia - improved, hold further LR - will continue to monitor  Rectal cancer ypT3ypN1a (1/23 LN) s/p neoadj chemoXRT, robotic LAR resection & diverting loop ileostomy 12/21/2017 - Follows closely outpatient with oncology; has been needed with Lonsurf; cycle 2 placed on hold recently due to LFT's   Hypothyroid - Continue Synthroid   Hypertension - recently more elevated - will continue to monitor for now, consider adding meds (treat pain, discomfort first)  T8 Compression Fracture - noted, follow    DVT prophylaxis: lovenox Code Status: full Family Communication: none Disposition:   Status is: Inpatient Remains inpatient appropriate because: worsening LFT's, further GI recs pending   Consultants:  GI  Procedures:  ERCP  Antimicrobials:  Anti-infectives (From admission, onward)    Start     Dose/Rate Route Frequency Ordered Stop   04/18/22 1230  ampicillin-sulbactam (UNASYN) 1.5 g in sodium chloride 0.9 % 100 mL IVPB  Status:  Discontinued        1.5 g 200 mL/hr over 30 Minutes  Intravenous  Once 04/18/22 1227 04/18/22 1531   04/18/22 1100  ampicillin-sulbactam (UNASYN) 1.5 g in sodium chloride 0.9 % 100 mL IVPB        1.5 g 200 mL/hr over 30 Minutes Intravenous Every 6 hours 04/18/22 0958         Subjective: Feels  better  Objective: Vitals:   04/23/22 1411 04/23/22 2110 04/24/22 0512 04/24/22 1306  BP: 117/60 (!) 138/57 (!) 107/50 (!) 119/56  Pulse: 82 81 73 79  Resp: 16 14 14 20   Temp: 98 F (36.7 C) 99 F (37.2 C) 98 F (36.7 C) 98.4 F (36.9 C)  TempSrc: Oral Oral Oral Oral  SpO2: 94% 92% 95% 97%  Weight:   95.4 kg   Height:        Intake/Output Summary (Last 24 hours) at 04/24/2022 1359 Last data filed at 04/24/2022 1022 Gross per 24 hour  Intake 360 ml  Output 135 ml  Net 225 ml   Filed Weights   04/22/22 0542 04/23/22 0530 04/24/22 0512  Weight: 96.7 kg 97.6 kg 95.4 kg    Examination:  General: No acute distress. Cardiovascular: RRR Lungs: unlabored Abdomen: Soft, nontender, nondistended - perc drain Neurological: Alert and oriented 3. Moves all extremities 4 Cranial nerves II through XII grossly intact. Extremities: No clubbing or cyanosis. No edema.   Data Reviewed: I have personally reviewed following labs and imaging studies  CBC: Recent Labs  Lab 04/17/22 1645 04/17/22 1934 04/19/22 0500 04/20/22 0500 04/21/22 0542 04/22/22 0849 04/23/22 0542 04/23/22 1647 04/24/22 0500  WBC 5.0   < > 5.9 11.6* 17.7* 10.7* 7.1  --  3.8*  NEUTROABS 3.6  --  5.1  --  14.2* 9.1* 5.5  --   --   HGB 10.7*   < > 10.5* 11.6* 10.5* 9.3* 8.5* 8.6* 8.1*  HCT 32.7*   < > 31.9* 34.6* 31.3* 27.9* 25.8* 26.1* 24.5*  MCV 102.5*   < > 102.6* 100.6* 101.6* 103.7* 103.2*  --  102.9*  PLT 111*   < > 106* 102* 85* 86* 79*  --  69*   < > = values in this interval not displayed.    Basic Metabolic Panel: Recent Labs  Lab 04/20/22 0500 04/21/22 0542 04/22/22 0849 04/23/22 0542 04/24/22 0500  NA 130* 129* 133* 134* 134*  K 4.1 4.1 3.8 3.5 3.4*  CL 99 99 101 102 100  CO2 21* 21* 24 25 24   GLUCOSE 98 106* 93 98 88  BUN 11 18 26* 26* 20  CREATININE 0.39* 0.68 0.64 0.61 0.62  CALCIUM 8.1* 7.8* 7.8* 7.9* 7.5*  MG  --  1.8 1.9 2.1 2.2  PHOS  --  4.2 3.6 3.7 3.7    GFR: Estimated  Creatinine Clearance: 68.8 mL/min (by C-G formula based on SCr of 0.62 mg/dL).  Liver Function Tests: Recent Labs  Lab 04/20/22 0500 04/21/22 0542 04/22/22 0849 04/23/22 0542 04/24/22 0500  AST 212* 148* 101* 81* 63*  ALT 136* 111* 91* 73* 59*  ALKPHOS 223* 168* 150* 142* 156*  BILITOT 19.1* 20.7* 20.0* 18.7* 12.8*  PROT 6.0* 5.5* 5.1* 4.9* 4.9*  ALBUMIN 2.7* 2.4* 2.0* 1.9* 2.0*    CBG: No results for input(s): "GLUCAP" in the last 168 hours.   No results found for this or any previous visit (from the past 240 hour(s)).       Radiology Studies: No results found.      Scheduled Meds:  Chlorhexidine Gluconate Cloth  6 each  Topical Daily   enoxaparin (LOVENOX) injection  40 mg Subcutaneous Q24H   feeding supplement  1 Container Oral TID BM   levothyroxine  50 mcg Oral Daily   predniSONE  5 mg Oral Q breakfast   Continuous Infusions:  ampicillin-sulbactam (UNASYN) IV 1.5 g (04/24/22 1249)     LOS: 7 days    Time spent: over 30 min    Fayrene Helper, MD [facility]   To contact the attending provider between 7A-7P or the covering provider during after hours 7P-7A, please log into the web site [email or web address] and access using universal  password for that web site. If you do not have the password, please call the hospital operator.  04/24/2022, 1:59 PM

## 2022-04-24 NOTE — Plan of Care (Signed)
Discussed with patient plan of care for the evening, pain management and medications with some teach back displayed.  Family in the room with her and she aloud me to update them as well.  Problem: Education: Goal: Knowledge of General Education information will improve Description: Including pain rating scale, medication(s)/side effects and non-pharmacologic comfort measures Outcome: Progressing   Problem: Health Behavior/Discharge Planning: Goal: Ability to manage health-related needs will improve Outcome: Progressing   Problem: Activity: Goal: Risk for activity intolerance will decrease Outcome: Progressing

## 2022-04-24 NOTE — Care Management Important Message (Signed)
Important Message  Patient Details IM Letter given. Name: Samantha Lutz MRN: [id number] Date of Birth: [birth year]   Medicare Important Message Given:  Yes     Kerin Salen 04/24/2022, 10:56 AM

## 2022-04-24 NOTE — Progress Notes (Signed)
Referring Physician(s): Ladell Pier  Supervising Physician: Sandi Mariscal  Patient Status:  [facility] - In-pt  Chief Complaint: Metastatic rectal cancer with biliary obstruction. S/p percutaneous cholangiogram with 8.5 Fr external biliary drain placed into the right hepatic bile duct by Dr. Dwaine Gale 04/21/22.    Subjective: Patient in bed awake/alert. Female visitor in the room. The RN was also in the room to draw labs. Patient feels better but there has been a mild-moderate amount of leaking/oozing from the drain site.   Allergies: Epinephrine, Losartan, Latex, and Vectibix [panitumumab]  Medications: Prior to Admission medications   Medication Sig Start Date End Date Taking? Authorizing Provider  albuterol (PROVENTIL HFA) 108 (90 Base) MCG/ACT inhaler Inhale 2 puffs into the lungs every 6 (six) hours as needed for wheezing or shortness of breath. 08/03/20  Yes Owens Shark, NP  bisacodyl (DULCOLAX) 5 MG EC tablet Take 5 mg by mouth daily as needed for mild constipation or moderate constipation.   Yes [provider]  levothyroxine (SYNTHROID) 50 MCG tablet Take 1 tablet by mouth once daily Patient taking differently: Take 50 mcg by mouth daily before breakfast. 02/06/22  Yes Ladell Pier, MD  lidocaine-prilocaine (EMLA) cream Apply 1 Application topically as needed. Apply to port site 1-2 hours prior to use Patient taking differently: Apply 1 Application topically as needed (for port access- one to two hours prior to accessing). 12/12/21  Yes Owens Shark, NP  magic mouthwash SOLN Take 5 mLs by mouth 4 (four) times daily as needed for mouth pain. 5 ml swish and spit 4 times a daily as needed for mouth pain 09/14/20  Yes Ladell Pier, MD  magnesium oxide (MAG-OX) 400 (240 Mg) MG tablet Take 1 tablet (400 mg total) by mouth 2 (two) times daily. 02/22/22  Yes Ladell Pier, MD  Nutritional Supplements (ENSURE ORIGINAL) LIQD Take 237 mLs by mouth See admin instructions. Drink 237  ml's (1 CHOCOLATE shake) by mouth once a day   Yes [provider]  potassium chloride (MICRO-K) 10 MEQ CR capsule Take 10 mEq by mouth daily.   Yes [provider]  predniSONE (DELTASONE) 5 MG tablet Take 1 tablet by mouth once daily with breakfast 03/15/22  Yes Ladell Pier, MD  prochlorperazine (COMPAZINE) 10 MG tablet Take 1 tablet (10 mg total) by mouth every 6 (six) hours as needed for nausea or vomiting. 03/01/21  Yes Owens Shark, NP  SYSTANE ULTRA PF 0.4-0.3 % SOLN Place 1 drop into both eyes 3 (three) times daily as needed (for dryness).   Yes [provider]  nystatin (MYCOSTATIN/NYSTOP) powder Apply 1 Application topically 3 (three) times daily. Patient not taking: Reported on 04/17/2022 12/12/21   Owens Shark, NP  Spacer/Aero-Holding Chambers (AEROCHAMBER MV) inhaler Use as instructed 12/31/19   Ladell Pier, MD  trifluridine-tipiracil (LONSURF) 20-8.19 MG tablet Take 2 tablets (40 mg Trifluridine) twice daily, take within 1 hr after AM & PM meals on days 1-5, 8-12. Repeat every 28 days. Start cycle on 04/10/22 Patient not taking: Reported on 04/17/2022 04/04/22   Ladell Pier, MD     Vital Signs: BP (!) 107/50 (BP Location: Right Arm)   Pulse 73   Temp 98 F (36.7 C) (Oral)   Resp 14   Ht 5\' 3"  (1.6 m)   Wt 210 lb 5.1 oz (95.4 kg)   LMP  (LMP Unknown)   SpO2 95%   BMI 37.26 kg/m   Physical  Exam Constitutional:      General: She is not in acute distress. Pulmonary:     Effort: Pulmonary effort is normal.  Abdominal:     Palpations: Abdomen is soft.     Tenderness: There is no abdominal tenderness.     Comments: RUQ drain to gravity. Approximately 30 ml of bile in gravity bag.   Skin:    General: Skin is warm and dry.     Coloration: Skin is jaundiced.  Neurological:     Mental Status: She is alert and oriented to person, place, and time.     Imaging: IR PERCUTANEOUS TRANSHEPATIC CHOLANGIOGRAM  Result Date:  04/22/2022 INDICATION: 72 year old woman with history metastatic rectal cancer causing biliary obstruction presents to IR for percutaneous cholangiogram and drain placement. Patient's bilirubin did not significantly improve status post ERCP and stent placement due to high position of obstructing hepatic metastasis. EXAM: Percutaneous transhepatic cholangiogram and biliary drain placement MEDICATIONS: Patient currently on inpatient regiment of antibiotics. ANESTHESIA/SEDATION: Moderate (conscious) sedation was employed during this procedure. A total of Versed 4 mg and Fentanyl 200 mcg was administered intravenously by the radiology nurse. Total intra-service moderate Sedation Time: 63 minutes. The patient's level of consciousness and vital signs were monitored continuously by radiology nursing throughout the procedure under my direct supervision. FLUOROSCOPY: Radiation Exposure Index (as provided by the fluoroscopic device): 123456 mGy Kerma COMPLICATIONS: None immediate. PROCEDURE: Informed written consent was obtained from the patient after a thorough discussion of the procedural risks, benefits and alternatives. All questions were addressed. Maximal Sterile Barrier Technique was utilized including caps, mask, sterile gowns, sterile gloves, sterile drape, hand hygiene and skin antiseptic. A timeout was performed prior to the initiation of the procedure. Patient positioned supine on the procedure table. The right upper quadrant and epigastric skin prepped and draped in usual fashion. Sonographic evaluation of the liver demonstrated minimal biliary dilatation in the right hepatic lobe. The atrophic left hepatic lobe could not be visualized with ultrasound. Following local lidocaine administration, multiple attempts were made to access bile ducts in the right hepatic lobe with a 21 gauge needle, however most are unsuccessful. Access was particularly difficult due to overlying ribs and high position of liver. 21 gauge  needle was advanced into the right hepatic lobe using ultrasound guidance and slowly retracted under fluoroscopy until bile return was noted. Contrast administered through the 21 gauge needle opacified the right hepatic ducts and showed patent common bile duct. Given the central location of the accessed duct, decision was made to access a more peripheral right hepatic lobe duct utilizing a 21 gauge needle. 21 gauge needle exchanged for transitional dilator set over 0.018 inch guidewire. Kumpe catheter and glidewire were advanced successfully across the right lobe bile duct stenosis, into the CBD, and eventually into the doudenum. Tract dilation was performed, however the internal external biliary drain could not be advanced into the liver due to the angle of access. Continuous bleeding was seen along the access tract when the catheter or drain were not in place. This is likely due to hemorrhage from an intercostal vessel. No pulsatile flow was identified. With drain across the access tract, there was no hemorrhage. 5 mL of lidocaine with epi was administered along the tract and pursestring suture was applied which showed no hemorrhage. Since internal external biliary drain could not be advanced, decision was made to leave on external biliary drain in the right hepatic lobe. 8.5 Leonides Schanz drain was formed in the right hepatic bile ducts.  Drain was secured to skin with suture and connected to bag. IMPRESSION: 1. Successful insertion of 8.5 French Dawson Mueller external biliary drain in right hepatic bile duct. 2. Placement was difficult due to position of patient's liver and minimal bile duct dilatation. 3. Slow continuous bleeding was present along the access tract when drain or catheter were not in place, likely originating from intercostal vessel. This was treated with lidocaine with epi and pursestring suture. With drain in place, there was no hemorrhage along the tract. PLAN: I suspect that the  patient's hyperbilirubinemia is not due to biliary obstruction given that the common bile duct was patent to the level of the doudenum on percutaneous transhepatic cholangiogram. If the patient's bilirubinemia significantly improves with external drainage, internalization can be considered. Electronically Signed   By: Miachel Roux M.D.   On: 04/22/2022 07:43   DG C-Arm 1-60 Min-No Report  Result Date: 04/21/2022 Fluoroscopy was utilized by the requesting physician.  No radiographic interpretation.    Labs:  CBC: Recent Labs    04/21/22 0542 04/22/22 0849 04/23/22 0542 04/23/22 1647 04/24/22 0500  WBC 17.7* 10.7* 7.1  --  3.8*  HGB 10.5* 9.3* 8.5* 8.6* 8.1*  HCT 31.3* 27.9* 25.8* 26.1* 24.5*  PLT 85* 86* 79*  --  69*    COAGS: Recent Labs    04/17/22 0841 04/24/22 0500  INR 1.1 1.2    BMP: Recent Labs    04/21/22 0542 04/22/22 0849 04/23/22 0542 04/24/22 0500  NA 129* 133* 134* 134*  K 4.1 3.8 3.5 3.4*  CL 99 101 102 100  CO2 21* 24 25 24   GLUCOSE 106* 93 98 88  BUN 18 26* 26* 20  CALCIUM 7.8* 7.8* 7.9* 7.5*  CREATININE 0.68 0.64 0.61 0.62  GFRNONAA >60 >60 >60 >60    LIVER FUNCTION TESTS: Recent Labs    04/21/22 0542 04/22/22 0849 04/23/22 0542 04/24/22 0500  BILITOT 20.7* 20.0* 18.7* 12.8*  AST 148* 101* 81* 63*  ALT 111* 91* 73* 59*  ALKPHOS 168* 150* 142* 156*  PROT 5.5* 5.1* 4.9* 4.9*  ALBUMIN 2.4* 2.0* 1.9* 2.0*    Assessment and Plan:  Metastatic rectal cancer with biliary obstruction. S/p percutaneous cholangiogram with 8.5 Fr external biliary drain placed into the right hepatic bile duct by Dr. Dwaine Gale 04/21/22.    Patient is afebrile and without leukocytosis. Down trending bilirubin level - 12.8 today. Patient also with a drop in hemoglobin possibly requiring a transfusion. Drain insertion site with a mild-moderate amount of leaking requiring several dressing changes today. IR to possibly plan for drain exchange/upsize with potential  internalization of the drain.   Drain Location: RUQ Size: 8.5 Fr Date of placement: 04/21/22  Currently to: Drain collection device: gravity 24 hour output:  Output by Drain (mL) 04/22/22 0700 - 04/22/22 1459 04/22/22 1500 - 04/22/22 2259 04/22/22 2300 - 04/23/22 0659 04/23/22 0700 - 04/23/22 1459 04/23/22 1500 - 04/23/22 2259 04/23/22 2300 - 04/24/22 0659 04/24/22 0700 - 04/24/22 1140  Biliary Tube Cook slip-coat 8 Fr. RUQ 30  50  75 60     Interval imaging/drain manipulation:  None  Current examination: Flushes/aspirates easily.  Insertion site unremarkable. Suture and stat lock in place. Dressed appropriately.   Plan: Continue TID flushes with 5 cc NS. Record output Q shift. Dressing changes QD or PRN if soiled.  Call IR APP or on call IR MD if difficulty flushing or sudden change in drain output.  Repeat imaging/possible drain injection once output <  10 mL/QD (excluding flush material). Consideration for drain removal if output is < 10 mL/QD (excluding flush material), pending discussion with the providing surgical service.  Discharge planning: Please contact IR APP or on call IR MD prior to patient d/c to ensure appropriate follow up plans are in place. Typically patient will follow up with IR clinic 10-14 days post d/c for repeat imaging/possible drain injection. IR scheduler will contact patient with date/time of appointment. Patient will need to flush drain QD with 5 cc NS, record output QD, dressing changes every 2-3 days or earlier if soiled.   IR will continue to follow - please call with questions or concerns.  Electronically Signed: Soyla Dryer, AGACNP-BC [phone] 04/24/2022, 9:06 AM   I spent a total of 15 Minutes at the the patient's bedside AND on the patient's hospital floor or unit, greater than 50% of which was counseling/coordinating care for external biliary drain.

## 2022-04-24 NOTE — Progress Notes (Signed)
IP PROGRESS NOTE  Subjective:   Samantha Lutz underwent underwent removal of the biliary stent on 04/21/2022.  She underwent insertion of a right hepatic duct external biliary drain on 04/21/2022.  A cholangiogram confirmed patency of the common bile duct.  An internal/external drain could not be placed.  She reports mild discomfort in the upper abdomen.  No other complaint.  Objective: Vital signs in last 24 hours: Blood pressure (!) 119/56, pulse 79, temperature 98.4 F (36.9 C), temperature source Oral, resp. rate 20, height 5\' 3"  (1.6 m), weight 210 lb 5.1 oz (95.4 kg), SpO2 97 %.  Intake/Output from previous day: 03/24 0701 - 03/25 0700 In: -  Out: 135 [Drains:135]  Physical Exam:  HEENT: Scleral icterus Lungs: Decreased breath sounds at the right posterior chest, with an inspiratory rub no respiratory distress Cardiac: Regular rate and rhythm Abdomen: Soft, no hepatomegaly, no tenderness in mid and right upper abdomen, right costal biliary drain Extremities: No leg edema Skin: Jaundice, ecchymoses over the arms and around the eyes  Portacath/PICC-without erythema  Lab Results: Recent Labs    04/23/22 0542 04/23/22 1647 04/24/22 0500  WBC 7.1  --  3.8*  HGB 8.5* 8.6* 8.1*  HCT 25.8* 26.1* 24.5*  PLT 79*  --  69*    BMET Recent Labs    04/23/22 0542 04/24/22 0500  NA 134* 134*  K 3.5 3.4*  CL 102 100  CO2 25 24  GLUCOSE 98 88  BUN 26* 20  CREATININE 0.61 0.62  CALCIUM 7.9* 7.5*    Lab Results  Component Value Date   CEA1 <1.00 06/01/2020   CEA <1.00 06/01/2020    Studies/Results: No results found.  Medications: I have reviewed the patient's current medications.  Assessment/Plan: Rectal cancer Mass at 7 cm from the anal verge on colonoscopy 08/09/2017, biopsy revealed invasive adenocarcinoma Staging CTs 08/17/2017-no evidence of metastatic disease, asymmetric thickening in the mid rectum MR pelvis 09/01/2017, T3N0 lesion beginning at 6.3 cm from the  anal sphincter Radiation/Xeloda initiated 09/17/2017, completed 10/25/2017 Low anterior resection/diverting ileostomy 12/21/2017,ypT3,ypN1a tumor.  Lymphovascular invasion present, intact mismatch repair protein expression, treatment effect present (TRS 1).  Mismatch repair protein IHC normal; Foundation 1-KRAS/NRAS wild-type, microsatellite status and tumor mutational burden could not be determined. Cycle 1 adjuvant Xeloda beginning 01/21/2018 Cycle 2 adjuvant Xeloda beginning 02/11/2018 Xeloda discontinued after cycle 2 secondary to patient preference Ileostomy takedown 07/10/2018 CTs 09/07/2018- multiple live small pulmonary nodules concerning for metastatic disease CT chest 12/10/2018-multiple bilateral lung nodules, some have increased in size CT chest 04/08/2019-mild enlargement of bilateral lung nodules Status post SBRT multiple lung nodules 05/06/2019, 05/08/2019, 05/13/2019 CT chest 08/28/2019-improvement and resolution in majority of right-sided pulmonary nodules, a superior segment right lower lobe nodule has increased, no new right-sided nodules.  Progressive enlargement of left-sided pulmonary nodules PET scan 09/09/2019-hypermetabolic right lower lobe nodule, 2 hypermetabolic left upper lobe nodules with an additional 0.7 cm left upper lobe nodule below PET resolution, groundglass opacity in the mid to right lower lobe with associated hypermetabolism consistent with postradiation change, hypermetabolic central segment 4A liver lesion Cycle 1 FOLFOX 10/14/2019 Cycle  2FOLFOX 11/11/2019, 5-FU and oxaliplatin dose reduced secondary to neutropenia and thrombocytopenia following cycle one, G-CSF declined Cycle 3 FOLFOX 11/26/2019 Cycle 4 FOLFOX 12/11/2019, oxaliplatin held due to neutropenia and thrombocytopenia Cycle 5 FOLFOX 12/31/2019 Cycle 6 FOLFOX 01/14/2020 (oxaliplatin held, 5-fluorouracil dose reduced due to mucositis) CT chest 01/27/2020-improvement in left lung nodules, progressive airspace  disease with traction bronchiectasis throughout the right  lung, previously noted right lung nodules are obscured, mildly enlarged right paratracheal lymph nodes-not hypermetabolic on prior PET, likely reactive Cycle 7 FOLFOX 01/28/2020 Cycle 8 FOLFOX 02/11/2020 (oxaliplatin held due to neutropenia and thrombocytopenia) Cycle 9 FOLFOX 02/25/2020 (oxaliplatin held due to thrombocytopenia) Cycle 10 FOLFOX 03/11/2020 (oxaliplatin held secondary to neuropathy) Cycle 11 FOLFOX 03/25/2020 (oxaliplatin held due to neuropathy) CT chest 04/05/2020-no new or progressive findings.  Interval evolution of presumed postradiation scarring in the right perihilar lung with decrease in the more diffuse groundglass opacity seen previously.  No substantial change in left lung nodules. 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  No definite CT findings to suggest recurrent tumor.  Stable small left upper lobe pulmonary nodules.  No new or progressive findings.  Stable small right paratracheal lymph nodes.  Stable area of irregular low-attenuation in hepatic segment 4A, site of known prior hepatic metastatic lesion.  No new or progressive findings. Cycle 18 5-fluorouracil 07/06/2020 Cycle 19 5-fluorouracil 07/20/2020 Cycle 20 5-fluorouracil 08/03/2020 Cycle 21 5-fluorouracil 08/17/2020  Cycle 22 5-fluorouracil 08/31/2020 Cycle 23 5-fluorouracil 09/14/2020 CT chest 09/24/2020-increased number and size of pulmonary nodules bilaterally.  Findings in the right upper lobe are concerning for potential lymphangitic spread of tumor.  Right upper lobe findings could also reflect evolving postradiation change. Cycle 1 FOLFIRI 10/12/2020 Cycle 2 FOLFIRI  11/08/2020, irinotecan dose reduced due to neutropenia and thrombocytopenia following cycle 1 Cycle 3 FOLFIRI 12/01/2020 Cycle 4 FOLFIRI 12/22/2020 CT chest 01/11/2021-similar bilateral pulmonary nodules.  Left upper lobe nodule has mildly decreased in size.  No new nodules. Cycle 5 FOLFIRI 01/12/2021 Cycle 6 FOLFIRI 02/03/2021 Cycle 7 FOLFIRI 03/01/2021 Cycle 8 FOLFIRI 03/22/2021 CT chest 04/08/2021-multiple small bilateral lung nodules not significantly changed.  No new nodules.  Unchanged enlarged pretracheal nodes.  Increase in fibrotic consolidation of the perihilar and inferior right lung. Cycle 9 FOLFIRI 04/12/2021 05/04/2021 treatment held due to fatigue Cycle 10 FOLFIRI 05/18/2021 06/07/2021-treatment held secondary to neutropenia Cycle 11 FOLFIRI 06/14/2021 Cycle 12 FOLFIRI 07/05/2021 Cycle 13 FOLFIRI 07/27/2021, irinotecan held 14 FOLFIRI 08/16/2021, irinotecan held CT chest 09/02/2021-enlargement of several lung nodules, no new nodules, stable postradiation changes in the right lower lobe and right middle lobe, gastroesophageal varices identified Cycle one 5-FU/Panitumumab 11/01/2021 Cycle two 5-FU/panitumumab 11/29/2021-Solu-Medrol and Pepcid prophylaxis added after she developed a cough during the panitumumab with cycle 1 Cycle 3 5-FU/panitumumab 12/12/2021 Cycle four 5-FU/panitumumab 12/27/2021 CT chest 01/25/2022-mild progression of metastatic disease to the lungs with increased size but stable number of metastatic lesions. Cycle 1 Lonsurf 02/27/2022 Cycle 2 Lonsurf 04/10/2022, placed on hold 04/13/2022 due to LFT abnormalities CT abdomen/pelvis 04/13/2022-persistent/chronic intrahepatic biliary dilatation most notable left hepatic lobe; vague area of low-attenuation near the porta hepatis, MRI recommended. MRI liver 04/14/2022-hypoenhancing 2.7 x 1.9 cm central liver mass in the region of the biliary hilum.  Associated high-grade biliary stricture at the biliary hilum affecting the central right  and left intrahepatic bile ducts.       2.   Hypothyroid   3.    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  Port-A-Cath placement 09/29/2019, interventional radiology   5.  Neutropenia and thrombocytopenia following cycle 1 FOLFOX-plan chemotherapy dose reductions, she declined G-CSF   6.  Mucositis following cycle 5 FOLFOX, 5-fluorouracil dose  reduced with cycle 6 Mucositis following cycle 22 5-fluorouracil-Magic mouthwash added   7.  Right lung airspace disease/volume loss-likely toxicity from chest radiation, trial of prednisone 01/29/2020; dyspnea and cough improved 02/11/2020 Progressive cough 03/11/2020-prednisone resumed at a dose of 20 mg daily Cough and dyspnea improved 03/25/2020-prednisone taper to 15 mg daily Improved 04/06/2020-prednisone taper to 10 mg daily Stable 04/21/2020-prednisone taper to 5 mg daily   8.  Gastroesophageal varices   9.   Admission 04/17/2022 with obstructive jaundice secondary to a central liver mass ERCP 04/18/2022-biliary obstruction secondary to a central liver mass, status post stent placement MRI/MRCP-paraesophageal varices, or a stent extending from the duodenum to the confluence of the right and left hepatic duct stent does not extend into either the right or left duct, 4 x 1.7 cm central liver mass, suspicion for left portal vein thrombosis, mild pancreatic and peripancreatic edema, esophagus and gastric varices EGD removal of biliary stent 04/21/2022 Placement of right external biliary drain 04/21/2022  10.  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  Pancreatitis   Samantha Lutz has metastatic rectal cancer.  She is admitted with biliary obstruction.  She underwent an ERCP and stent placement on 04/18/2022.  The bilirubin remains markedly elevated and she appears to have developed post ERCP pancreatitis.  T The MRI suggest the current stent is not relieving the biliary  obstruction. She underwent placement of a right external biliary drain 04/21/2022.  The bilirubin is improved today. The hemoglobin is lower, likely secondary to bleeding from the biliary drain procedure and phlebotomy.   She has chronic portal hypertension.  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  I do not recommend anticoagulation therapy.   Recommendations: Continue external biliary drainage, consider attempt at internalizing the biliary stent per IR Systemic therapy for rectal cancer remains on hold Outpatient follow-up will be scheduled at the Cancer center   LOS: 7 days   Betsy Coder, MD   04/24/2022, 5:15 PM

## 2022-04-24 NOTE — Progress Notes (Signed)
Mobility Specialist - Progress Note   04/24/22 1418  Mobility  Activity Ambulated with assistance in hallway  Level of Assistance Standby assist, set-up cues, supervision of patient - no hands on  Assistive Device None  Distance Ambulated (ft) 500 ft  Activity Response Tolerated well  Mobility Referral Yes  $Mobility charge 1 Mobility   Pt received in bed and agreed to mobility, with no c/o pain nor discomfort during session. Pt returned to bed with all needs met and family in room.   Roderick Pee Mobility Specialist

## 2022-04-25 ENCOUNTER — Inpatient Hospital Stay (HOSPITAL_COMMUNITY): Payer: Medicare Other

## 2022-04-25 ENCOUNTER — Encounter (HOSPITAL_COMMUNITY): Payer: Self-pay | Admitting: Internal Medicine

## 2022-04-25 ENCOUNTER — Other Ambulatory Visit (HOSPITAL_COMMUNITY): Payer: Self-pay

## 2022-04-25 DIAGNOSIS — K831 Obstruction of bile duct: Secondary | ICD-10-CM | POA: Diagnosis not present

## 2022-04-25 LAB — CBC
HCT: 24.2 % — ABNORMAL LOW (ref 36.0–46.0)
Hemoglobin: 8.1 g/dL — ABNORMAL LOW (ref 12.0–15.0)
MCH: 34.5 pg — ABNORMAL HIGH (ref 26.0–34.0)
MCHC: 33.5 g/dL (ref 30.0–36.0)
MCV: 103 fL — ABNORMAL HIGH (ref 80.0–100.0)
Platelets: 77 10*3/uL — ABNORMAL LOW (ref 150–400)
RBC: 2.35 MIL/uL — ABNORMAL LOW (ref 3.87–5.11)
RDW: 19.2 % — ABNORMAL HIGH (ref 11.5–15.5)
WBC: 3.8 10*3/uL — ABNORMAL LOW (ref 4.0–10.5)
nRBC: 0 % (ref 0.0–0.2)

## 2022-04-25 LAB — HEPATIC FUNCTION PANEL
ALT: 47 U/L — ABNORMAL HIGH (ref 0–44)
AST: 52 U/L — ABNORMAL HIGH (ref 15–41)
Albumin: 2 g/dL — ABNORMAL LOW (ref 3.5–5.0)
Alkaline Phosphatase: 148 U/L — ABNORMAL HIGH (ref 38–126)
Bilirubin, Direct: 5.1 mg/dL — ABNORMAL HIGH (ref 0.0–0.2)
Indirect Bilirubin: 4.3 mg/dL — ABNORMAL HIGH (ref 0.3–0.9)
Total Bilirubin: 9.4 mg/dL — ABNORMAL HIGH (ref 0.3–1.2)
Total Protein: 4.8 g/dL — ABNORMAL LOW (ref 6.5–8.1)

## 2022-04-25 LAB — BASIC METABOLIC PANEL
Anion gap: 7 (ref 5–15)
BUN: 16 mg/dL (ref 8–23)
CO2: 23 mmol/L (ref 22–32)
Calcium: 7.7 mg/dL — ABNORMAL LOW (ref 8.9–10.3)
Chloride: 105 mmol/L (ref 98–111)
Creatinine, Ser: 0.61 mg/dL (ref 0.44–1.00)
GFR, Estimated: >60 mL/min (ref >60)
Glucose, Bld: 110 mg/dL — ABNORMAL HIGH (ref 70–99)
Potassium: 3.5 mmol/L (ref 3.5–5.1)
Sodium: 135 mmol/L (ref 135–145)

## 2022-04-25 LAB — MAGNESIUM: Magnesium: 2 mg/dL (ref 1.7–2.4)

## 2022-04-25 LAB — PHOSPHORUS: Phosphorus: 3.4 mg/dL (ref 2.5–4.6)

## 2022-04-25 MED ORDER — ENOXAPARIN SODIUM 40 MG/0.4ML IJ SOSY: 40.0000 mg | PREFILLED_SYRINGE | INTRAMUSCULAR | Status: DC

## 2022-04-25 MED ORDER — HEPARIN SOD (PORK) LOCK FLUSH 100 UNIT/ML IV SOLN
INTRAVENOUS | Status: AC
  Filled 2022-04-25: qty 5

## 2022-04-25 MED ORDER — FUROSEMIDE 10 MG/ML IJ SOLN: 40.0000 mg | Freq: Every day | INTRAMUSCULAR | Status: DC

## 2022-04-25 MED ORDER — POTASSIUM CHLORIDE CRYS ER 20 MEQ PO TBCR
40.0000 meq | EXTENDED_RELEASE_TABLET | ORAL | Status: AC
  Administered 2022-04-25 (×2): 40 meq via ORAL
  Filled 2022-04-25 (×2): qty 2

## 2022-04-25 MED ORDER — IOHEXOL 300 MG/ML  SOLN
100.0000 mL | Freq: Once | INTRAMUSCULAR | Status: AC | PRN
  Administered 2022-04-25: 100 mL via INTRAVENOUS

## 2022-04-25 MED ORDER — FUROSEMIDE 10 MG/ML IJ SOLN
40.0000 mg | Freq: Once | INTRAMUSCULAR | Status: AC
  Administered 2022-04-25: 40 mg via INTRAVENOUS
  Filled 2022-04-25: qty 4

## 2022-04-25 MED ORDER — SODIUM CHLORIDE (PF) 0.9 % IJ SOLN
INTRAMUSCULAR | Status: AC
  Filled 2022-04-25: qty 50

## 2022-04-25 MED ORDER — SODIUM CHLORIDE 0.9 % IV SOLN
2.0000 g | INTRAVENOUS | Status: AC
  Filled 2022-04-25 (×2): qty 2

## 2022-04-25 MED ORDER — HEPARIN SOD (PORK) LOCK FLUSH 100 UNIT/ML IV SOLN
500.0000 [IU] | Freq: Once | INTRAVENOUS | Status: AC
  Administered 2022-04-25: 500 [IU] via INTRAVENOUS

## 2022-04-25 NOTE — Progress Notes (Signed)
PROGRESS NOTE    Samantha Lutz  [id number] DOB: 21-Jul-1950 DOA: 04/17/2022 PCP: Samantha Lefort, FNP  No chief complaint on file.   Brief Narrative:   [name] is Samantha Lutz 72 yo female with PMH rectal cancer with liver metastasis who presented with mild abdominal discomfort and outpatient elevated total bilirubin.  This has been trended and found to not be resolving.  MRI liver was obtained on 04/14/2022 which showed underlying hypoenhancing central liver mass measuring 2.7 x 1.9 cm compatible with solitary recurrent liver metastasis.  Also showed underlying associated high-grade malignant biliary hilar stricture.  She was admitted for GI evaluation and expedited endoscopy for further evaluation.  S/p ERCP and stent placement 3/19.  Hospitalization complicated by post ERCP pancreatitis and rising LFT's.  GI removed biliary stent on 3/22 and IR was consulted for external biliary drain.  IR now following and planning additional procedure 3/27.    Assessment & Plan:   Principal Problem:   Biliary obstruction Active Problems:   Rectal cancer ypT3ypN1a (1/23 LN) s/p neoadj chemoXRT, robotic LAR resection & diverting loop ileostomy 12/21/2017   Hypertension   Hypothyroid   Post-ERCP acute pancreatitis   Disorder of bile duct stent  Biliary obstruction - Underwent MRI liver on 04/14/2022 showing high-grade malignant biliary hilar stricture - s/p ERCP 3/19 with localized biliary strictures found in the hepatitic duct system from extrahepatic mestastasis.  Strictures malignant appearing.  Treated with stent placement.  - s/p unasyn - MRCP with biliary stent extending from duodenum to vicinity of central mass near the confluence of the R and L hepatic ducts - mild right and L intrahepatic biliary dilatation indicates that the stent may possibly not be transversing the mass or maybe only partially transversing the mass - L hepatic lobe biliary dilatation is mildly improved but not resolved.    - bili mildly downtrending, AST/ALT/alk phos downtrending - removal of biliary stent 3/22 - s/p external biliary drain in right hepatic bile duct 3/22, difficult placement, bleeding when drain/catheter not in place (due to intercostal vessel), treated with liod with epi and pursestring suture - no bleeding with drain in place.  Per Rads, concern that hyperbili not due to biliary obstruction as CBD patent to level of duodenum, consider internalization if bilirubin improves with external drainage. - CT abd with contrast 3/26 with R sided perc biliary drain with formed pigtail in central right hepatic ducts.  Diminished R sided biliary ductal dilatation.  Similar appearance of L sided biliary ductal dilatation.  Ill defined hypodensity in central liver.   - plan for cholangiogram, potential internalization of biliary drain, potential new bilary drain placement 3/27 per IR - GI now signed off, may take time for bili to normalize  Post ERCP Pancreatitis - seems improved, will follow  -CT today with persistent pancreatitis, symptoms have improved  Volume Overload  Perihepatic and Perisplenic Ascites  Right Sided Pleural Effusion Follow CXR and echo  Will start some lasix  Strict I/O, daily weights - up 14 lbs since admission, net positive 10 L   Anemia - downtrending, will follow - relatively stable today (notably difficult placement of external biliary drain, bleeding noted) - will continue to trend  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 Vein Thrombosis - no need for anticoagulation per discussion with oncology  Leukocytosis  Fever - due to above, s/p abx - resolved  Hyponatremia - improved, hold further LR - will continue to monitor  Rectal cancer ypT3ypN1a (1/23 LN) s/p neoadj chemoXRT, robotic LAR resection &  diverting loop ileostomy 12/21/2017 - Follows closely outpatient with oncology; has been needed with [facility]; cycle 2 placed on hold recently due to LFT's   Hypothyroid - Continue Synthroid    Hypertension - follow, currently no BP meds  T8 Compression Fracture - noted, follow    DVT prophylaxis: lovenox Code Status: full Family Communication: none Disposition:   Status is: Inpatient Remains inpatient appropriate because: further IR procedures   Consultants:  GI  Procedures:  ERCP  Antimicrobials:  Anti-infectives (From admission, onward)    Start     Dose/Rate Route Frequency Ordered Stop   04/26/22 0730  cefOXitin (MEFOXIN) 2 g in sodium chloride 0.9 % 100 mL IVPB        2 g 200 mL/hr over 30 Minutes Intravenous On call 04/25/22 1555 04/27/22 0730   04/18/22 1230  ampicillin-sulbactam (UNASYN) 1.5 g in sodium chloride 0.9 % 100 mL IVPB  Status:  Discontinued        1.5 g 200 mL/hr over 30 Minutes Intravenous  Once 04/18/22 1227 04/18/22 1531   04/18/22 1100  ampicillin-sulbactam (UNASYN) 1.5 g in sodium chloride 0.9 % 100 mL IVPB  Status:  Discontinued        1.5 g 200 mL/hr over 30 Minutes Intravenous Every 6 hours 04/18/22 0958 04/25/22 0928       Subjective: No new complaints  Objective: Vitals:   04/24/22 2214 04/25/22 0500 04/25/22 0641 04/25/22 1316  BP: 136/62  (!) 126/59 (!) 125/57  Pulse: 76  82 75  Resp: 14  14 20   Temp: 98.3 F (36.8 C)  (!) 97.2 F (36.2 C) 98.3 F (36.8 C)  TempSrc: Oral  Oral Oral  SpO2: 97%  98% 98%  Weight:  96 kg 97.9 kg   Height:        Intake/Output Summary (Last 24 hours) at 04/25/2022 1624 Last data filed at 04/25/2022 1034 Gross per 24 hour  Intake 1485.07 ml  Output 75 ml  Net 1410.07 ml   Filed Weights   04/24/22 0512 04/25/22 0500 04/25/22 0641  Weight: 95.4 kg 96 kg 97.9 kg    Examination:  General: No acute distress. Cardiovascular: RRR Lungs: unlabored Abdomen: Soft, nontender, nondistended - perc drain Neurological: Alert and oriented 3. Moves all extremities 4 with equal strength. Cranial nerves II through XII grossly intact. Extremities: No clubbing or cyanosis. No  edema.  Data Reviewed: I have personally reviewed following labs and imaging studies  CBC: Recent Labs  Lab 04/19/22 0500 04/20/22 0500 04/21/22 0542 04/22/22 0849 04/23/22 0542 04/23/22 1647 04/24/22 0500 04/24/22 1854 04/25/22 0333  WBC 5.9   < > 17.7* 10.7* 7.1  --  3.8*  --  3.8*  NEUTROABS 5.1  --  14.2* 9.1* 5.5  --   --   --   --   HGB 10.5*   < > 10.5* 9.3* 8.5* 8.6* 8.1* 8.3* 8.1*  HCT 31.9*   < > 31.3* 27.9* 25.8* 26.1* 24.5* 24.9* 24.2*  MCV 102.6*   < > 101.6* 103.7* 103.2*  --  102.9*  --  103.0*  PLT 106*   < > 85* 86* 79*  --  69*  --  77*   < > = values in this interval not displayed.    Basic Metabolic Panel: Recent Labs  Lab 04/21/22 0542 04/22/22 0849 04/23/22 0542 04/24/22 0500 04/25/22 0333  NA 129* 133* 134* 134* 135  K 4.1 3.8 3.5 3.4* 3.5  CL 99 101 102 100 105  CO2 21* 24 25 24 23   GLUCOSE 106* 93 98 88 110*  BUN 18 26* 26* 20 16  CREATININE 0.68 0.64 0.61 0.62 0.61  CALCIUM 7.8* 7.8* 7.9* 7.5* 7.7*  MG 1.8 1.9 2.1 2.2 2.0  PHOS 4.2 3.6 3.7 3.7 3.4    GFR: Estimated Creatinine Clearance: 69.8 mL/min (by C-G formula based on SCr of 0.61 mg/dL).  Liver Function Tests: Recent Labs  Lab 04/21/22 0542 04/22/22 0849 04/23/22 0542 04/24/22 0500 04/25/22 0812  AST 148* 101* 81* 63* 52*  ALT 111* 91* 73* 59* 47*  ALKPHOS 168* 150* 142* 156* 148*  BILITOT 20.7* 20.0* 18.7* 12.8* 9.4*  PROT 5.5* 5.1* 4.9* 4.9* 4.8*  ALBUMIN 2.4* 2.0* 1.9* 2.0* 2.0*    CBG: No results for input(s): "GLUCAP" in the last 168 hours.   No results found for this or any previous visit (from the past 240 hour(s)).       Radiology Studies: CT ABDOMEN W CONTRAST  Result Date: 04/25/2022 CLINICAL DATA:  Metastatic colon cancer, biliary obstruction suspected, status post stent placement, pain around drainage site * Tracking Code: BO * EXAM: CT ABDOMEN WITH CONTRAST TECHNIQUE: Multidetector CT imaging of the abdomen was performed using the standard  protocol following bolus administration of intravenous contrast. RADIATION DOSE REDUCTION: This exam was performed according to the departmental dose-optimization program which includes automated exposure control, adjustment of the mA and/or kV according to patient size and/or use of iterative reconstruction technique. CONTRAST:  125mL OMNIPAQUE IOHEXOL 300 MG/ML  SOLN COMPARISON:  CT abdomen pelvis, 04/13/2022 FINDINGS: Lower chest: Moderate right pleural effusion associated atelectasis or consolidation, increased compared to prior examination. Underlying mass of the dependent right lung base, similar in appearance. Hepatobiliary: Interval placement of Trezure Cronk right-sided percutaneous biliary drain, with Bobbyjo Marulanda formed pigtail in the central right hepatic ducts (series 2, image 27). No fluid collection or other abnormality associated with the drain tract. Diminished right-sided biliary ductal dilatation on today's examination. Similar appearance of left-sided biliary ductal dilatation (series 2, image 23). No directly visualized obstipated metastasis however there is ill-defined hypodensity in the central liver (series 2, image 23). Numerous small gallstones and excreted contrast in the gallbladder. Pancreas: New inflammatory fat stranding about the pancreatic head and uncinate (series 2, image 41). No acute pancreatic fluid collection. Spleen: Unchanged splenomegaly, maximum span 14.4 cm. Adrenals/Urinary Tract: Adrenal glands are unremarkable. Kidneys are normal, without renal calculi, solid lesion, or hydronephrosis. Stomach/Bowel: Stomach is within normal limits. No evidence of bowel wall thickening, distention, or inflammatory changes. Vascular/Lymphatic: Aortic atherosclerosis. Splenic and gastroesophageal varices (series 2, image 21). No enlarged abdominal lymph nodes. Other: No abdominal wall hernia or abnormality. New small volume perihepatic and perisplenic ascites. Musculoskeletal: No acute or significant osseous  findings. IMPRESSION: 1. Interval placement of Kimberlye Dilger right-sided percutaneous biliary drain, with Maci Eickholt formed pigtail in the central right hepatic ducts. No fluid collection or other abnormality associated with the drain tract. 2. Diminished right-sided biliary ductal dilatation on today's examination. Similar appearance of left-sided biliary ductal dilatation. No directly visualized obstipating metastasis however there is ill-defined hypodensity in the central liver keeping with Anisten Tomassi known metastasis. 3. New inflammatory fat stranding about the pancreatic head and uncinate, consistent with acute pancreatitis. No acute pancreatic fluid collection. 4. New small volume perihepatic and perisplenic ascites. 5. Moderate right pleural effusion associated atelectasis or consolidation, increased compared to prior examination. Underlying mass of the dependent right lung base, similar in appearance. 6. Cholelithiasis. 7. Unchanged splenomegaly. 8. Splenic and gastroesophageal  varices. Aortic Atherosclerosis (ICD10-I70.0). Electronically Signed   By: Delanna Ahmadi M.D.   On: 04/25/2022 13:37        Scheduled Meds:  Chlorhexidine Gluconate Cloth  6 each Topical Daily   [START ON 04/26/2022] enoxaparin (LOVENOX) injection  40 mg Subcutaneous Q24H   feeding supplement  1 Container Oral TID BM   heparin lock flush       levothyroxine  50 mcg Oral Daily   predniSONE  5 mg Oral Q breakfast   sodium chloride (PF)       sodium chloride flush  5 mL Intracatheter Q8H   Continuous Infusions:  [START ON 04/26/2022] cefOXitin       LOS: 8 days    Time spent: over 30 min    Fayrene Helper, MD [facility]   To contact the attending provider between 7A-7P or the covering provider during after hours 7P-7A, please log into the web site [email or web address] and access using universal [place] password for that web site. If you do not have the password, please call the hospital operator.  04/25/2022, 4:24 PM

## 2022-04-25 NOTE — Progress Notes (Signed)
Nutrition Follow-up  DOCUMENTATION CODES:   Obesity unspecified  INTERVENTION:  Continue regular diet  Continue Boost Breeze po TID, each supplement provides 250 kcal and 9 grams of protein  NUTRITION DIAGNOSIS:   Increased nutrient needs related to chronic illness as evidenced by estimated needs.   GOAL:   Patient will meet greater than or equal to 90% of their needs  -goal unmet   MONITOR:   PO intake, Supplement acceptance, Diet advancement, Weight trends, Labs, I & O's  REASON FOR ASSESSMENT:   Malnutrition Screening Tool    ASSESSMENT:   72 yo female with PMH rectal cancer with liver metastasis who presented with mild abdominal discomfort and outpatient elevated total bilirubin.  MRI liver was obtained on 04/14/2022 which showed underlying hypoenhancing central liver mass measuring 2.7 x 1.9 cm compatible with solitary recurrent liver metastasis.  Also showed underlying associated high-grade malignant biliary hilar stricture.  Labs: Glu 110, alk phos 148, AST 52, ALT 47 Meds: deltasone, NS Wt: CBW 215.8#, admit wt 201.2#  -Wt gain likely r/t fluid shifts  PO: 83% avg meal intake x last 3 documented meals since diet has been upgraded to solid foods  I/O's: +9L   Patient unavailable for interview x2 attempts. Per chart review, she is tolerating a regular diet and eating adequately.   S/p removal of biliary stent 3/22  S/p insertion of R hepatic duct external biliary drain 3/22 (unable to place internally)   Diet Order:   Diet Order             Diet regular Room service appropriate? Yes; Fluid consistency: Thin  Diet effective now                   EDUCATION NEEDS:   No education needs have been identified at this time  Skin:  Skin Assessment: Reviewed RN Assessment  Last BM:  3/16  Height:   Ht Readings from Last 1 Encounters:  04/18/22 5\' 3"  (1.6 m)    Weight:   Wt Readings from Last 1 Encounters:  04/25/22 97.9 kg    BMI:  Body mass  index is 38.23 kg/m.  Estimated Nutritional Needs:   Kcal:  1550-1750  Protein:  75-90g  Fluid:  1.7L/day    Trey Paula, RDN, LDN  Clinical Nutrition

## 2022-04-25 NOTE — Progress Notes (Signed)
Mobility Specialist - Progress Note   04/25/22 1210  Mobility  Activity Ambulated independently in hallway  Level of Assistance Independent  Assistive Device None  Distance Ambulated (ft) 500 ft  Activity Response Tolerated well  Mobility Referral Yes  $Mobility charge 1 Mobility   Pt received in bed and agreeable to mobility. No complaints during session. Pt to bed after session with all needs met.    [facility]

## 2022-04-25 NOTE — Progress Notes (Signed)
Referring Physician(s): Ladell Pier  Supervising Physician: Sandi Mariscal  Patient Status:  [facility] - In-pt  Chief Complaint:  Abdominal pain, jaundice, metastatic rectal cancer , biliary obstruction  Subjective: Pt still with jaundice, RUQ discomfort, occ nausea; no fever/chills/resp issues   Allergies: Epinephrine, Losartan, Latex, and Vectibix [panitumumab]  Medications: Prior to Admission medications   Medication Sig Start Date End Date Taking? Authorizing Provider  albuterol (PROVENTIL HFA) 108 (90 Base) MCG/ACT inhaler Inhale 2 puffs into the lungs every 6 (six) hours as needed for wheezing or shortness of breath. 08/03/20  Yes Owens Shark, NP  bisacodyl (DULCOLAX) 5 MG EC tablet Take 5 mg by mouth daily as needed for mild constipation or moderate constipation.   Yes [provider]  levothyroxine (SYNTHROID) 50 MCG tablet Take 1 tablet by mouth once daily Patient taking differently: Take 50 mcg by mouth daily before breakfast. 02/06/22  Yes Ladell Pier, MD  lidocaine-prilocaine (EMLA) cream Apply 1 Application topically as needed. Apply to port site 1-2 hours prior to use Patient taking differently: Apply 1 Application topically as needed (for port access- one to two hours prior to accessing). 12/12/21  Yes Owens Shark, NP  magic mouthwash SOLN Take 5 mLs by mouth 4 (four) times daily as needed for mouth pain. 5 ml swish and spit 4 times a daily as needed for mouth pain 09/14/20  Yes Ladell Pier, MD  magnesium oxide (MAG-OX) 400 (240 Mg) MG tablet Take 1 tablet (400 mg total) by mouth 2 (two) times daily. 02/22/22  Yes Ladell Pier, MD  Nutritional Supplements (ENSURE ORIGINAL) LIQD Take 237 mLs by mouth See admin instructions. Drink 237 ml's (1 CHOCOLATE shake) by mouth once a day   Yes [provider]  potassium chloride (MICRO-K) 10 MEQ CR capsule Take 10 mEq by mouth daily.   Yes [provider]  predniSONE (DELTASONE) 5 MG tablet  Take 1 tablet by mouth once daily with breakfast 03/15/22  Yes Ladell Pier, MD  prochlorperazine (COMPAZINE) 10 MG tablet Take 1 tablet (10 mg total) by mouth every 6 (six) hours as needed for nausea or vomiting. 03/01/21  Yes Owens Shark, NP  SYSTANE ULTRA PF 0.4-0.3 % SOLN Place 1 drop into both eyes 3 (three) times daily as needed (for dryness).   Yes [provider]  nystatin (MYCOSTATIN/NYSTOP) powder Apply 1 Application topically 3 (three) times daily. Patient not taking: Reported on 04/17/2022 12/12/21   Owens Shark, NP  Spacer/Aero-Holding Chambers (AEROCHAMBER MV) inhaler Use as instructed 12/31/19   Ladell Pier, MD  trifluridine-tipiracil (LONSURF) 20-8.19 MG tablet Take 2 tablets (40 mg Trifluridine) twice daily, take within 1 hr after AM & PM meals on days 1-5, 8-12. Repeat every 28 days. Start cycle on 04/10/22 Patient not taking: Reported on 04/17/2022 04/04/22   Ladell Pier, MD     Vital Signs: BP (!) 125/57 (BP Location: Right Arm)   Pulse 75   Temp 98.3 F (36.8 C) (Oral)   Resp 20   Ht 5\' 3"  (1.6 m)   Wt 215 lb 13.3 oz (97.9 kg)   LMP  (LMP Unknown)   SpO2 98%   BMI 38.23 kg/m   Physical Exam awake/alert; chest-  dim BS rt base, left clear; rt chest port site clean and dry; heart- RRR; abd- soft,+BS, intact rt biliary drain with green bile in bag, OP 75 cc, insertion site mild- mod tender, bilat pretibial edema  Imaging: CT ABDOMEN W CONTRAST  Result Date: 04/25/2022 CLINICAL DATA:  Metastatic colon cancer, biliary obstruction suspected, status post stent placement, pain around drainage site * Tracking Code: BO * EXAM: CT ABDOMEN WITH CONTRAST TECHNIQUE: Multidetector CT imaging of the abdomen was performed using the standard protocol following bolus administration of intravenous contrast. RADIATION DOSE REDUCTION: This exam was performed according to the departmental dose-optimization program which includes automated exposure control, adjustment  of the mA and/or kV according to patient size and/or use of iterative reconstruction technique. CONTRAST:  137mL OMNIPAQUE IOHEXOL 300 MG/ML  SOLN COMPARISON:  CT abdomen pelvis, 04/13/2022 FINDINGS: Lower chest: Moderate right pleural effusion associated atelectasis or consolidation, increased compared to prior examination. Underlying mass of the dependent right lung base, similar in appearance. Hepatobiliary: Interval placement of a right-sided percutaneous biliary drain, with a formed pigtail in the central right hepatic ducts (series 2, image 27). No fluid collection or other abnormality associated with the drain tract. Diminished right-sided biliary ductal dilatation on today's examination. Similar appearance of left-sided biliary ductal dilatation (series 2, image 23). No directly visualized obstipated metastasis however there is ill-defined hypodensity in the central liver (series 2, image 23). Numerous small gallstones and excreted contrast in the gallbladder. Pancreas: New inflammatory fat stranding about the pancreatic head and uncinate (series 2, image 41). No acute pancreatic fluid collection. Spleen: Unchanged splenomegaly, maximum span 14.4 cm. Adrenals/Urinary Tract: Adrenal glands are unremarkable. Kidneys are normal, without renal calculi, solid lesion, or hydronephrosis. Stomach/Bowel: Stomach is within normal limits. No evidence of bowel wall thickening, distention, or inflammatory changes. Vascular/Lymphatic: Aortic atherosclerosis. Splenic and gastroesophageal varices (series 2, image 21). No enlarged abdominal lymph nodes. Other: No abdominal wall hernia or abnormality. New small volume perihepatic and perisplenic ascites. Musculoskeletal: No acute or significant osseous findings. IMPRESSION: 1. Interval placement of a right-sided percutaneous biliary drain, with a formed pigtail in the central right hepatic ducts. No fluid collection or other abnormality associated with the drain tract. 2.  Diminished right-sided biliary ductal dilatation on today's examination. Similar appearance of left-sided biliary ductal dilatation. No directly visualized obstipating metastasis however there is ill-defined hypodensity in the central liver keeping with a known metastasis. 3. New inflammatory fat stranding about the pancreatic head and uncinate, consistent with acute pancreatitis. No acute pancreatic fluid collection. 4. New small volume perihepatic and perisplenic ascites. 5. Moderate right pleural effusion associated atelectasis or consolidation, increased compared to prior examination. Underlying mass of the dependent right lung base, similar in appearance. 6. Cholelithiasis. 7. Unchanged splenomegaly. 8. Splenic and gastroesophageal varices. Aortic Atherosclerosis (ICD10-I70.0). Electronically Signed   By: Delanna Ahmadi M.D.   On: 04/25/2022 13:37   IR PERCUTANEOUS TRANSHEPATIC CHOLANGIOGRAM  Result Date: 04/22/2022 INDICATION: 72 year old woman with history metastatic rectal cancer causing biliary obstruction presents to IR for percutaneous cholangiogram and drain placement. Patient's bilirubin did not significantly improve status post ERCP and stent placement due to high position of obstructing hepatic metastasis. EXAM: Percutaneous transhepatic cholangiogram and biliary drain placement MEDICATIONS: Patient currently on inpatient regiment of antibiotics. ANESTHESIA/SEDATION: Moderate (conscious) sedation was employed during this procedure. A total of Versed 4 mg and Fentanyl 200 mcg was administered intravenously by the radiology nurse. Total intra-service moderate Sedation Time: 63 minutes. The patient's level of consciousness and vital signs were monitored continuously by radiology nursing throughout the procedure under my direct supervision. FLUOROSCOPY: Radiation Exposure Index (as provided by the fluoroscopic device): [id number] mGy Kerma COMPLICATIONS: None immediate. PROCEDURE: Informed written consent was  obtained from the  patient after a thorough discussion of the procedural risks, benefits and alternatives. All questions were addressed. Maximal Sterile Barrier Technique was utilized including caps, mask, sterile gowns, sterile gloves, sterile drape, hand hygiene and skin antiseptic. A timeout was performed prior to the initiation of the procedure. Patient positioned supine on the procedure table. The right upper quadrant and epigastric skin prepped and draped in usual fashion. Sonographic evaluation of the liver demonstrated minimal biliary dilatation in the right hepatic lobe. The atrophic left hepatic lobe could not be visualized with ultrasound. Following local lidocaine administration, multiple attempts were made to access bile ducts in the right hepatic lobe with a 21 gauge needle, however most are unsuccessful. Access was particularly difficult due to overlying ribs and high position of liver. 21 gauge needle was advanced into the right hepatic lobe using ultrasound guidance and slowly retracted under fluoroscopy until bile return was noted. Contrast administered through the 21 gauge needle opacified the right hepatic ducts and showed patent common bile duct. Given the central location of the accessed duct, decision was made to access a more peripheral right hepatic lobe duct utilizing a 21 gauge needle. 21 gauge needle exchanged for transitional dilator set over 0.018 inch guidewire. Kumpe catheter and glidewire were advanced successfully across the right lobe bile duct stenosis, into the CBD, and eventually into the doudenum. Tract dilation was performed, however the internal external biliary drain could not be advanced into the liver due to the angle of access. Continuous bleeding was seen along the access tract when the catheter or drain were not in place. This is likely due to hemorrhage from an intercostal vessel. No pulsatile flow was identified. With drain across the access tract, there was no  hemorrhage. 5 mL of lidocaine with epi was administered along the tract and pursestring suture was applied which showed no hemorrhage. Since internal external biliary drain could not be advanced, decision was made to leave on external biliary drain in the right hepatic lobe. 8.5 Leonides Schanz drain was formed in the right hepatic bile ducts. Drain was secured to skin with suture and connected to bag. IMPRESSION: 1. Successful insertion of 8.5 French Dawson Mueller external biliary drain in right hepatic bile duct. 2. Placement was difficult due to position of patient's liver and minimal bile duct dilatation. 3. Slow continuous bleeding was present along the access tract when drain or catheter were not in place, likely originating from intercostal vessel. This was treated with lidocaine with epi and pursestring suture. With drain in place, there was no hemorrhage along the tract. PLAN: I suspect that the patient's hyperbilirubinemia is not due to biliary obstruction given that the common bile duct was patent to the level of the doudenum on percutaneous transhepatic cholangiogram. If the patient's bilirubinemia significantly improves with external drainage, internalization can be considered. Electronically Signed   By: Miachel Roux M.D.   On: 04/22/2022 07:43    Labs:  CBC: Recent Labs    04/22/22 0849 04/23/22 0542 04/23/22 1647 04/24/22 0500 04/24/22 1854 04/25/22 0333  WBC 10.7* 7.1  --  3.8*  --  3.8*  HGB 9.3* 8.5* 8.6* 8.1* 8.3* 8.1*  HCT 27.9* 25.8* 26.1* 24.5* 24.9* 24.2*  PLT 86* 79*  --  69*  --  77*    COAGS: Recent Labs    04/17/22 0841 04/24/22 0500  INR 1.1 1.2    BMP: Recent Labs    04/22/22 0849 04/23/22 0542 04/24/22 0500 04/25/22 0333  NA 133* 134* 134*  135  K 3.8 3.5 3.4* 3.5  CL 101 102 100 105  CO2 24 25 24 23   GLUCOSE 93 98 88 110*  BUN 26* 26* 20 16  CALCIUM 7.8* 7.9* 7.5* 7.7*  CREATININE 0.64 0.61 0.62 0.61  GFRNONAA >60 >60 >60 >60    LIVER  FUNCTION TESTS: Recent Labs    04/22/22 0849 04/23/22 0542 04/24/22 0500 04/25/22 0812  BILITOT 20.0* 18.7* 12.8* 9.4*  AST 101* 81* 63* 52*  ALT 91* 73* 59* 47*  ALKPHOS 150* 142* 156* 148*  PROT 5.1* 4.9* 4.9* 4.8*  ALBUMIN 2.0* 1.9* 2.0* 2.0*    Assessment and Plan: Pt known to IR team from port a cath placement on 09/29/19. She has a PMH sig for hypothyroidism, GE varices, pancreatitis, thrombocytopenia, portal HTN, anemia, arthritis, fatty liver, and metastatic rectal cancer initially diagnosed in 2019. She now has obstructive jaundice from biliary obstruction and underwent stent placement by GI on 04/18/22; s/p stent removal on 3/22 for displacement; s/p rt ext biliary drain 3/22; afebrile, WBC 3.8, hgb 8.1, plts 77k, PT 15.1/ INR 1.2, creat 0.61, t bili 9.4(12.8); CT abd today:   1. Interval placement of a right-sided percutaneous biliary drain, with a formed pigtail in the central right hepatic ducts. No fluid collection or other abnormality associated with the drain tract. 2. Diminished right-sided biliary ductal dilatation on today's examination. Similar appearance of left-sided biliary ductal dilatation. No directly visualized obstipating metastasis however there is ill-defined hypodensity in the central liver keeping with a known metastasis. 3. New inflammatory fat stranding about the pancreatic head and uncinate, consistent with acute pancreatitis. No acute pancreatic fluid collection. 4. New small volume perihepatic and perisplenic ascites. 5. Moderate right pleural effusion associated atelectasis or consolidation, increased compared to prior examination. Underlying mass of the dependent right lung base, similar in appearance. 6. Cholelithiasis. 7. Unchanged splenomegaly. 8. Splenic and gastroesophageal varices.   Aortic Atherosclerosis   Latest imaging has been reviewed by Dr. Pascal Lux; plan is for cholangiogram, potential internalization of biliary drain, potential  new biliary drain placements tomorrow with anesthesia assistance.Risks and benefits discussed with the patient/daughter  including bleeding, infection, damage to adjacent structures, anesthesia related complications,  and sepsis.  All of the patient's questions were answered, patient is agreeable to proceed. Consent signed and in chart.    Electronically Signed: D. Rowe Robert, PA-C 04/25/2022, 3:23 PM   I spent a total of 20 minutes at the the patient's bedside AND on the patient's hospital floor or unit, greater than 50% of which was counseling/coordinating care for biliary drain    Patient ID: Samantha Lutz, female   DOB: 05-Apr-1950, 72 y.o.   MRN: [id number]

## 2022-04-26 ENCOUNTER — Inpatient Hospital Stay (HOSPITAL_COMMUNITY): Payer: Medicare Other | Admitting: Anesthesiology

## 2022-04-26 ENCOUNTER — Inpatient Hospital Stay (HOSPITAL_COMMUNITY): Payer: Medicare Other

## 2022-04-26 ENCOUNTER — Encounter (HOSPITAL_COMMUNITY)
Admission: AD | Disposition: A | Discharge status: Home / Self Care | Payer: Self-pay | Source: Ambulatory Visit | Attending: Internal Medicine

## 2022-04-26 ENCOUNTER — Encounter (HOSPITAL_COMMUNITY): Payer: Self-pay | Admitting: Internal Medicine

## 2022-04-26 DIAGNOSIS — I10 Essential (primary) hypertension: Secondary | ICD-10-CM

## 2022-04-26 DIAGNOSIS — J449 Chronic obstructive pulmonary disease, unspecified: Secondary | ICD-10-CM | POA: Diagnosis not present

## 2022-04-26 DIAGNOSIS — K831 Obstruction of bile duct: Secondary | ICD-10-CM | POA: Diagnosis not present

## 2022-04-26 DIAGNOSIS — C799 Secondary malignant neoplasm of unspecified site: Secondary | ICD-10-CM | POA: Diagnosis not present

## 2022-04-26 DIAGNOSIS — C20 Malignant neoplasm of rectum: Secondary | ICD-10-CM

## 2022-04-26 HISTORY — PX: RADIOLOGY WITH ANESTHESIA: SHX6223

## 2022-04-26 HISTORY — PX: IR CONVERT BILIARY DRAIN TO INT EXT BILIARY DRAIN: IMG6045

## 2022-04-26 SURGERY — IR WITH ANESTHESIA
Anesthesia: General

## 2022-04-26 MED ORDER — ALBUMIN HUMAN 5 % IV SOLN
INTRAVENOUS | Status: AC
  Filled 2022-04-26: qty 250

## 2022-04-26 MED ORDER — HYDRALAZINE HCL 20 MG/ML IJ SOLN: 5.0000 mg | Freq: Once | INTRAMUSCULAR | Status: AC

## 2022-04-26 MED ORDER — ORAL CARE MOUTH RINSE: 15.0000 mL | Freq: Once | OROMUCOSAL | Status: AC

## 2022-04-26 MED ORDER — LIDOCAINE HCL (PF) 2 % IJ SOLN
INTRAMUSCULAR | Status: AC
  Filled 2022-04-26: qty 5

## 2022-04-26 MED ORDER — HYDROMORPHONE HCL 1 MG/ML IJ SOLN
INTRAMUSCULAR | Status: AC
  Administered 2022-04-26: 0.5 mg via INTRAVENOUS
  Filled 2022-04-26: qty 1

## 2022-04-26 MED ORDER — AMISULPRIDE (ANTIEMETIC) 5 MG/2ML IV SOLN
10.0000 mg | Freq: Once | INTRAVENOUS | Status: AC
  Administered 2022-04-26: 10 mg via INTRAVENOUS

## 2022-04-26 MED ORDER — ENOXAPARIN SODIUM 40 MG/0.4ML IJ SOSY
40.0000 mg | PREFILLED_SYRINGE | INTRAMUSCULAR | Status: DC
  Administered 2022-04-27 – 2022-05-04 (×8): 40 mg via SUBCUTANEOUS
  Filled 2022-04-26 (×9): qty 0.4

## 2022-04-26 MED ORDER — DEXAMETHASONE SODIUM PHOSPHATE 10 MG/ML IJ SOLN
INTRAMUSCULAR | Status: AC
  Filled 2022-04-26: qty 1

## 2022-04-26 MED ORDER — AMISULPRIDE (ANTIEMETIC) 5 MG/2ML IV SOLN
INTRAVENOUS | Status: AC
  Filled 2022-04-26: qty 4

## 2022-04-26 MED ORDER — DEXAMETHASONE SODIUM PHOSPHATE 10 MG/ML IJ SOLN
INTRAMUSCULAR | Status: DC | PRN
  Administered 2022-04-26: 10 mg via INTRAVENOUS

## 2022-04-26 MED ORDER — PHENYLEPHRINE HCL-NACL 20-0.9 MG/250ML-% IV SOLN
INTRAVENOUS | Status: AC
  Filled 2022-04-26: qty 250

## 2022-04-26 MED ORDER — FENTANYL CITRATE PF 50 MCG/ML IJ SOSY
PREFILLED_SYRINGE | INTRAMUSCULAR | Status: AC
  Filled 2022-04-26: qty 1

## 2022-04-26 MED ORDER — LIDOCAINE-EPINEPHRINE 1 %-1:100000 IJ SOLN
INTRAMUSCULAR | Status: AC
  Filled 2022-04-26: qty 1

## 2022-04-26 MED ORDER — SODIUM CHLORIDE 0.9% FLUSH
5.0000 mL | Freq: Three times a day (TID) | INTRAVENOUS | Status: DC
  Administered 2022-04-26 – 2022-05-04 (×24): 5 mL

## 2022-04-26 MED ORDER — FENTANYL CITRATE (PF) 250 MCG/5ML IJ SOLN
INTRAMUSCULAR | Status: AC
  Filled 2022-04-26: qty 5

## 2022-04-26 MED ORDER — ROCURONIUM BROMIDE 10 MG/ML (PF) SYRINGE
PREFILLED_SYRINGE | INTRAVENOUS | Status: DC | PRN
  Administered 2022-04-26: 50 mg via INTRAVENOUS
  Administered 2022-04-26: 30 mg via INTRAVENOUS
  Administered 2022-04-26: 10 mg via INTRAVENOUS

## 2022-04-26 MED ORDER — PHENYLEPHRINE HCL-NACL 20-0.9 MG/250ML-% IV SOLN
INTRAVENOUS | Status: DC | PRN
  Administered 2022-04-26: 20 ug/min via INTRAVENOUS

## 2022-04-26 MED ORDER — HYDRALAZINE HCL 20 MG/ML IJ SOLN
INTRAMUSCULAR | Status: AC
  Administered 2022-04-26: 5 mg via INTRAVENOUS
  Filled 2022-04-26: qty 1

## 2022-04-26 MED ORDER — HYDRALAZINE HCL 20 MG/ML IJ SOLN: 5.0000 mg | Freq: Once | INTRAMUSCULAR | Status: DC

## 2022-04-26 MED ORDER — PROPOFOL 10 MG/ML IV BOLUS
INTRAVENOUS | Status: AC
  Filled 2022-04-26: qty 20

## 2022-04-26 MED ORDER — ONDANSETRON HCL 4 MG/2ML IJ SOLN
INTRAMUSCULAR | Status: DC | PRN
  Administered 2022-04-26: 4 mg via INTRAVENOUS

## 2022-04-26 MED ORDER — FENTANYL CITRATE PF 50 MCG/ML IJ SOSY
PREFILLED_SYRINGE | INTRAMUSCULAR | Status: AC
  Administered 2022-04-26: 50 ug via INTRAVENOUS
  Filled 2022-04-26: qty 2

## 2022-04-26 MED ORDER — ALBUMIN HUMAN 5 % IV SOLN: INTRAVENOUS | Status: DC | PRN

## 2022-04-26 MED ORDER — LIDOCAINE 2% (20 MG/ML) 5 ML SYRINGE
INTRAMUSCULAR | Status: DC | PRN
  Administered 2022-04-26: 20 mg via INTRAVENOUS

## 2022-04-26 MED ORDER — PROPOFOL 10 MG/ML IV BOLUS
INTRAVENOUS | Status: DC | PRN
  Administered 2022-04-26: 80 mg via INTRAVENOUS

## 2022-04-26 MED ORDER — FENTANYL CITRATE PF 50 MCG/ML IJ SOSY
25.0000 ug | PREFILLED_SYRINGE | INTRAMUSCULAR | Status: DC | PRN
  Administered 2022-04-26 (×2): 50 ug via INTRAVENOUS

## 2022-04-26 MED ORDER — ONDANSETRON HCL 4 MG/2ML IJ SOLN
INTRAMUSCULAR | Status: AC
  Filled 2022-04-26: qty 2

## 2022-04-26 MED ORDER — SUGAMMADEX SODIUM 200 MG/2ML IV SOLN
INTRAVENOUS | Status: DC | PRN
  Administered 2022-04-26: 200 mg via INTRAVENOUS

## 2022-04-26 MED ORDER — CHLORHEXIDINE GLUCONATE 0.12 % MT SOLN
15.0000 mL | Freq: Once | OROMUCOSAL | Status: AC
  Administered 2022-04-26: 15 mL via OROMUCOSAL

## 2022-04-26 MED ORDER — LACTATED RINGERS IV SOLN: INTRAVENOUS | Status: DC

## 2022-04-26 MED ORDER — FENTANYL CITRATE (PF) 250 MCG/5ML IJ SOLN
INTRAMUSCULAR | Status: DC | PRN
  Administered 2022-04-26 (×2): 50 ug via INTRAVENOUS

## 2022-04-26 MED ORDER — MIDAZOLAM HCL 2 MG/2ML IJ SOLN
INTRAMUSCULAR | Status: AC
  Filled 2022-04-26: qty 2

## 2022-04-26 MED ORDER — HYDROMORPHONE HCL 1 MG/ML IJ SOLN
0.2500 mg | INTRAMUSCULAR | Status: DC | PRN
  Administered 2022-04-26 (×2): 0.5 mg via INTRAVENOUS

## 2022-04-26 MED ORDER — IOHEXOL 300 MG/ML  SOLN
100.0000 mL | Freq: Once | INTRAMUSCULAR | Status: AC | PRN
  Administered 2022-04-26: 40 mL

## 2022-04-26 NOTE — Progress Notes (Addendum)
Progress Note    Samantha Lutz   [id number]  DOB: [birth year]  DOA: 04/17/2022     9 PCP: Andre Lefort, FNP  Initial CC: elevated total [facility] Course: Samantha Lutz is a 72 yo female with PMH rectal cancer with liver metastasis who presented with mild abdominal discomfort and outpatient elevated total bilirubin.  This has been trended and found to not be resolving.  MRI liver was obtained on 04/14/2022 which showed underlying hypoenhancing central liver mass measuring 2.7 x 1.9 cm compatible with solitary recurrent liver metastasis.  Also showed underlying associated high-grade malignant biliary hilar stricture.  She was admitted for GI evaluation and expedited endoscopy for further evaluation.  She underwent ERCP and stent placement 3/19. Hospitalization complicated by post ERCP pancreatitis and rising LFT's. GI removed biliary stent on 3/22 and IR was consulted for external biliary drain. She underwent fluoro guided right sided transhepatic biliary drainage catheter with IR on 04/26/2022.  Interval History:  No events overnight.  Seen in room after returning from IR for biliary drain.  Assessment and Plan: * Biliary obstruction - Underwent MRI liver on 04/14/2022 showing high-grade malignant biliary hilar stricture - s/p ERCP 3/19 with localized biliary strictures found in the hepatitic duct system from extrahepatic mestastasis.  Strictures malignant appearing.  Treated with stent placement.  - s/p unasyn - MRCP with biliary stent extending from duodenum to vicinity of central mass near the confluence of the R and L hepatic ducts - mild right and L intrahepatic biliary dilatation indicates that the stent may possibly not be transversing the mass or maybe only partially transversing the mass - L hepatic lobe biliary dilatation is mildly improved but not resolved.   - removal of biliary stent 3/22 - s/p external biliary drain in right hepatic bile duct 3/22, difficult  placement, bleeding when drain/catheter not in place (due to intercostal vessel), treated with liod with epi and pursestring suture - no bleeding with drain in place.  Per Rads, concern that hyperbili not due to biliary obstruction as CBD patent to level of duodenum, consider internalization if bilirubin improves with external drainage. - CT abd with contrast 3/26 with R sided perc biliary drain with formed pigtail in central right hepatic ducts.  Diminished R sided biliary ductal dilatation.  Similar appearance of L sided biliary ductal dilatation.  Ill defined hypodensity in central liver.   - GI now signed off, may take time for bili to normalize - s/p fluoroscopy guided right sided approach biliary drainage catheter placed in IR on 04/26/2022; removal of initial biliary drain  Rectal cancer [id number] (1/23 LN) s/p neoadj chemoXRT, robotic LAR resection & diverting loop ileostomy 12/21/2017 - Follows closely outpatient with oncology; has been needed with Lonsurf; cycle 2 placed on hold recently due to hyperTB  Post-ERCP acute pancreatitis - seems improved, will follow  -CT with persistent pancreatitis, symptoms have improved  Hypothyroid - Continue Synthroid  Hypertension - relatively controlled -No home meds noted on med rec   Volume Overload  Perihepatic and Perisplenic Ascites  Right Sided Pleural Effusion Follow CXR and echo  Will start some lasix  Strict I/O, daily weights - up 14 lbs since admission, net positive 10 L    Anemia - downtrending, will follow - relatively stable today (notably difficult placement of external biliary drain, bleeding noted) - will continue to trend   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 Vein Thrombosis - no need for anticoagulation per discussion with oncology   Leukocytosis  Fever -  due to above, s/p abx - resolved   Hyponatremia - improved, hold further LR   T8 Compression Fracture - noted, follow   Old records reviewed in assessment of this  patient  Antimicrobials: Unasyn 04/18/22 >> 04/25/22  DVT prophylaxis:  enoxaparin (LOVENOX) injection 40 mg Start: 04/27/22 1000 SCDs Start: 04/17/22 1934   Code Status:   Code Status: Full Code  Mobility Assessment (last 72 hours)     Mobility Assessment     Row Name 04/25/22 2015 04/25/22 0945 04/24/22 0930       Does patient have an order for bedrest or is patient medically unstable No - Continue assessment No - Continue assessment No - Continue assessment     What is the highest level of mobility based on the progressive mobility assessment? Level 6 (Walks independently in room and hall) - Balance while walking in room without assist - Complete Level 6 (Walks independently in room and hall) - Balance while walking in room without assist - Complete Level 6 (Walks independently in room and hall) - Balance while walking in room without assist - Complete              Barriers to discharge:  Disposition Plan:  Home Status is: Inpt  Objective: Blood pressure (!) 166/64, pulse 72, temperature 98 F (36.7 C), temperature source Oral, resp. rate 14, height 5\' 2"  (1.575 m), weight 97.8 kg, SpO2 92 %.  Examination:  Physical Exam Constitutional:      Appearance: Normal appearance.  HENT:     Head: Normocephalic and atraumatic.     Mouth/Throat:     Mouth: Mucous membranes are moist.  Eyes:     Extraocular Movements: Extraocular movements intact.  Cardiovascular:     Rate and Rhythm: Normal rate and regular rhythm.  Pulmonary:     Effort: Pulmonary effort is normal.     Breath sounds: Normal breath sounds.  Abdominal:     General: Bowel sounds are normal. There is no distension.     Palpations: Abdomen is soft.     Tenderness: There is no abdominal tenderness.     Comments: Right side noted with biliary drain in place  Musculoskeletal:        General: Normal range of motion.     Cervical back: Normal range of motion and neck supple.  Skin:    General: Skin is warm  and dry.  Neurological:     General: No focal deficit present.     Mental Status: She is alert.  Psychiatric:        Mood and Affect: Mood normal.        Behavior: Behavior normal.      Consultants:  GI IR  Procedures:    Data Reviewed: No results found for this or any previous visit (from the past 24 hour(s)).   I have reviewed pertinent nursing notes, vitals, labs, and images as necessary. I have ordered labwork to follow up on as indicated.  I have reviewed the last notes from staff over past 24 hours. I have discussed patient's care plan and test results with nursing staff, CM/SW, and other staff as appropriate.  Time spent: Greater than 50% of the 55 minute visit was spent in counseling/coordination of care for the patient as laid out in the A&P.   LOS: 9 days   Dwyane Dee, MD [facility] 04/26/2022, 4:13 PM

## 2022-04-26 NOTE — Sedation Documentation (Signed)
Patient under care of the anesthesia department. Please see their charting for vitals, body temperature and assessment. Anesthesia responsible for medication administration and hemodynamics.

## 2022-04-26 NOTE — Anesthesia Preprocedure Evaluation (Addendum)
Anesthesia Evaluation  Patient identified by MRN, date of birth, ID band Patient awake    Reviewed: Allergy & Precautions, NPO status , Patient's Chart, lab work & pertinent test results  Airway Mallampati: II  TM Distance: >3 FB Neck ROM: Full    Dental  (+) Teeth Intact, Dental Advisory Given   Pulmonary neg pulmonary ROS,  COPD inhaler   Pulmonary exam normal breath sounds clear to auscultation       Cardiovascular hypertension, Normal cardiovascular exam Rhythm:Regular Rate:Normal     Neuro/Psych negative neurological ROS  negative psych ROS   GI/Hepatic negative GI ROS,,,Biliary obstruction- indwelling stent Elevated LFT's Hx/o rectal Ca S/P Low anterior resection, ostomy and closure   Endo/Other  Hypothyroidism  Morbid obesity (BMI 40)  Renal/GU negative Renal ROS  negative genitourinary   Musculoskeletal  (+) Arthritis , Osteoarthritis,    Abdominal   Peds  Hematology  (+) Blood dyscrasia, anemia Lab Results      Component                Value               Date                      WBC                      3.8 (L)             04/25/2022                HGB                      8.1 (L)             04/25/2022                HCT                      24.2 (L)            04/25/2022                MCV                      103.0 (H)           04/25/2022                PLT                      77 (L)              04/25/2022              Anesthesia Other Findings Metastatic rectal CA  Reproductive/Obstetrics                              Anesthesia Physical Anesthesia Plan  ASA: 3  Anesthesia Plan: General   Post-op Pain Management: Tylenol PO (pre-op)*   Induction: Intravenous  PONV Risk Score and Plan: 3 and Ondansetron, Treatment may vary due to age or medical condition and Dexamethasone  Airway Management Planned: Oral ETT  Additional Equipment: None  Intra-op Plan:    Post-operative Plan: Extubation in OR  Informed Consent: I have reviewed the patients History and Physical, chart, labs and discussed the procedure including the risks, benefits and alternatives  for the proposed anesthesia with the patient or authorized representative who has indicated his/her understanding and acceptance.     Dental advisory given  Plan Discussed with: CRNA  Anesthesia Plan Comments:         Anesthesia Quick Evaluation

## 2022-04-26 NOTE — Anesthesia Procedure Notes (Signed)
Procedure Name: Intubation Date/Time: 04/26/2022 8:52 AM  Performed by: Jenne Campus, CRNAPre-anesthesia Checklist: Patient identified, Emergency Drugs available, Suction available and Patient being monitored Patient Re-evaluated:Patient Re-evaluated prior to induction Oxygen Delivery Method: Circle System Utilized Preoxygenation: Pre-oxygenation with 100% oxygen Induction Type: IV induction Ventilation: Mask ventilation without difficulty Laryngoscope Size: Miller and 2 Grade View: Grade I Tube type: Oral Tube size: 7.0 mm Number of attempts: 1 Airway Equipment and Method: Stylet and Oral airway Placement Confirmation: ETT inserted through vocal cords under direct vision, positive ETCO2 and breath sounds checked- equal and bilateral Secured at: 20.5 cm Tube secured with: Tape Dental Injury: Teeth and Oropharynx as per pre-operative assessment

## 2022-04-26 NOTE — Procedures (Signed)
Pre procedural Dx: Obstructive Juandice, Sub optimal biliary drain positioning. Post procedural Dx: Same  Successful fluoro guided placement of a right sided approach transhepatic 10 Fr biliary drainage catheter with end coiled and locked within the duodenum.  Biliary drain connected to gravity bag.  Removal of initial suboptimally positioned biliary drain.  Attempted though ultimately unsuccessful traversal into the left biliary system which was only minimally opacified centrally.  EBL: Trace Complications: None immediate  Ronny Bacon, MD Pager #: [phone]

## 2022-04-26 NOTE — Sedation Documentation (Signed)
Patient to PACU bay 9 with CRNA

## 2022-04-26 NOTE — Assessment & Plan Note (Addendum)
resolved 

## 2022-04-26 NOTE — Progress Notes (Signed)
MEDICATION-RELATED CONSULT NOTE   IR Procedure Consult - Anticoagulant/Antiplatelet PTA/Inpatient Med List Review by Pharmacist    Procedure: bili drain placement    Completed: 3/27 around 1pm  Post-Procedural bleeding risk per IR MD assessment:  standard  Antithrombotic medications on inpatient or PTA profile prior to procedure:   Lovenox 40mg  q24    Recommended restart time per IR Post-Procedure Guidelines:  Day + 1 (next AM)   Other considerations:      Plan:    Restart Lovenox 40mg  daily 3/28 AM   Adrian Saran, PharmD, BCPS Secure Chat if ?s 04/26/2022 1:54 PM

## 2022-04-26 NOTE — Transfer of Care (Signed)
Immediate Anesthesia Transfer of Care Note  Patient: Samantha Lutz  Procedure(s) Performed: PERCUTANEOUS TRANSHEPATIC  CHOLANGIOGRAM  Patient Location: PACU  Anesthesia Type:General  Level of Consciousness: oriented, drowsy, and patient cooperative  Airway & Oxygen Therapy: Patient Spontanous Breathing and Patient connected to face mask oxygen  Post-op Assessment: Report given to RN and Post -op Vital signs reviewed and stable  Post vital signs: Reviewed  Last Vitals:  Vitals Value Taken Time  BP 157/85 04/26/22 1120  Temp    Pulse 74 04/26/22 1124  Resp 19 04/26/22 1124  SpO2 100 % 04/26/22 1124  Vitals shown include unvalidated device data.  Last Pain:  Vitals:   04/26/22 0811  TempSrc:   PainSc: 0-No pain      Patients Stated Pain Goal: 2 ([id number])  Complications: No notable events documented.

## 2022-04-26 NOTE — Progress Notes (Addendum)
Referring Physician(s): Ladell Pier  Supervising Physician: Sandi Mariscal  Patient Status:  [facility] - In-pt  Chief Complaint:  Abdominal pain, jaundice, metastatic rectal cancer , biliary obstruction   Subjective: Pt without new c/o; remains jaundiced   Allergies: Epinephrine, Losartan, Latex, and Vectibix [panitumumab]  Medications: Prior to Admission medications   Medication Sig Start Date End Date Taking? Authorizing Provider  albuterol (PROVENTIL HFA) 108 (90 Base) MCG/ACT inhaler Inhale 2 puffs into the lungs every 6 (six) hours as needed for wheezing or shortness of breath. 08/03/20  Yes Owens Shark, NP  bisacodyl (DULCOLAX) 5 MG EC tablet Take 5 mg by mouth daily as needed for mild constipation or moderate constipation.   Yes [provider]  levothyroxine (SYNTHROID) 50 MCG tablet Take 1 tablet by mouth once daily Patient taking differently: Take 50 mcg by mouth daily before breakfast. 02/06/22  Yes Ladell Pier, MD  lidocaine-prilocaine (EMLA) cream Apply 1 Application topically as needed. Apply to port site 1-2 hours prior to use Patient taking differently: Apply 1 Application topically as needed (for port access- one to two hours prior to accessing). 12/12/21  Yes Owens Shark, NP  magic mouthwash SOLN Take 5 mLs by mouth 4 (four) times daily as needed for mouth pain. 5 ml swish and spit 4 times a daily as needed for mouth pain 09/14/20  Yes Ladell Pier, MD  magnesium oxide (MAG-OX) 400 (240 Mg) MG tablet Take 1 tablet (400 mg total) by mouth 2 (two) times daily. 02/22/22  Yes Ladell Pier, MD  Nutritional Supplements (ENSURE ORIGINAL) LIQD Take 237 mLs by mouth See admin instructions. Drink 237 ml's (1 CHOCOLATE shake) by mouth once a day   Yes [provider]  potassium chloride (MICRO-K) 10 MEQ CR capsule Take 10 mEq by mouth daily.   Yes [provider]  predniSONE (DELTASONE) 5 MG tablet Take 1 tablet by mouth once daily with  breakfast 03/15/22  Yes Ladell Pier, MD  prochlorperazine (COMPAZINE) 10 MG tablet Take 1 tablet (10 mg total) by mouth every 6 (six) hours as needed for nausea or vomiting. 03/01/21  Yes Owens Shark, NP  SYSTANE ULTRA PF 0.4-0.3 % SOLN Place 1 drop into both eyes 3 (three) times daily as needed (for dryness).   Yes [provider]  nystatin (MYCOSTATIN/NYSTOP) powder Apply 1 Application topically 3 (three) times daily. Patient not taking: Reported on 04/17/2022 12/12/21   Owens Shark, NP  Spacer/Aero-Holding Chambers (AEROCHAMBER MV) inhaler Use as instructed 12/31/19   Ladell Pier, MD  trifluridine-tipiracil (LONSURF) 20-8.19 MG tablet Take 2 tablets (40 mg Trifluridine) twice daily, take within 1 hr after AM & PM meals on days 1-5, 8-12. Repeat every 28 days. Start cycle on 04/10/22 Patient not taking: Reported on 04/17/2022 04/04/22   Ladell Pier, MD     Vital Signs: BP (!) 122/52 (BP Location: Right Arm)   Pulse 77   Temp 97.8 F (36.6 C) (Oral)   Resp 18   Ht 5\' 2"  (1.575 m)   Wt 215 lb 9.8 oz (97.8 kg)   LMP  (LMP Unknown)   SpO2 96%   BMI 39.44 kg/m   Physical Exam: awake/alert; jaundiced/scleral icterus; chest- dim BS rt base, left clear; clean, intact rt chest port a cath:  heart- RRR; abd- soft,+BS,NT, rt biliary drain intact with green bile in bag; ext- no sig edema  Imaging: DG CHEST PORT 1 VIEW  Result Date:  04/25/2022 CLINICAL DATA:  Chest pain and shortness of breath EXAM: PORTABLE CHEST 1 VIEW COMPARISON:  12/31/2019, CT from earlier in the same day. FINDINGS: Cardiac shadow is stable. Right chest wall port is again noted. Left lung is clear with the exception of nodules in the upper lobe similar to that seen on previous exams. Right-sided pleural effusion with underlying consolidation is noted similar to that seen on CT from earlier in the same day. No other focal abnormality is noted. IMPRESSION: Persistent right-sided effusion and underlying  consolidation. Stable left upper lobe pulmonary nodules. Electronically Signed   By: Inez Catalina M.D.   On: 04/25/2022 19:18   CT ABDOMEN W CONTRAST  Result Date: 04/25/2022 CLINICAL DATA:  Metastatic colon cancer, biliary obstruction suspected, status post stent placement, pain around drainage site * Tracking Code: BO * EXAM: CT ABDOMEN WITH CONTRAST TECHNIQUE: Multidetector CT imaging of the abdomen was performed using the standard protocol following bolus administration of intravenous contrast. RADIATION DOSE REDUCTION: This exam was performed according to the departmental dose-optimization program which includes automated exposure control, adjustment of the mA and/or kV according to patient size and/or use of iterative reconstruction technique. CONTRAST:  115mL OMNIPAQUE IOHEXOL 300 MG/ML  SOLN COMPARISON:  CT abdomen pelvis, 04/13/2022 FINDINGS: Lower chest: Moderate right pleural effusion associated atelectasis or consolidation, increased compared to prior examination. Underlying mass of the dependent right lung base, similar in appearance. Hepatobiliary: Interval placement of a right-sided percutaneous biliary drain, with a formed pigtail in the central right hepatic ducts (series 2, image 27). No fluid collection or other abnormality associated with the drain tract. Diminished right-sided biliary ductal dilatation on today's examination. Similar appearance of left-sided biliary ductal dilatation (series 2, image 23). No directly visualized obstipated metastasis however there is ill-defined hypodensity in the central liver (series 2, image 23). Numerous small gallstones and excreted contrast in the gallbladder. Pancreas: New inflammatory fat stranding about the pancreatic head and uncinate (series 2, image 41). No acute pancreatic fluid collection. Spleen: Unchanged splenomegaly, maximum span 14.4 cm. Adrenals/Urinary Tract: Adrenal glands are unremarkable. Kidneys are normal, without renal calculi,  solid lesion, or hydronephrosis. Stomach/Bowel: Stomach is within normal limits. No evidence of bowel wall thickening, distention, or inflammatory changes. Vascular/Lymphatic: Aortic atherosclerosis. Splenic and gastroesophageal varices (series 2, image 21). No enlarged abdominal lymph nodes. Other: No abdominal wall hernia or abnormality. New small volume perihepatic and perisplenic ascites. Musculoskeletal: No acute or significant osseous findings. IMPRESSION: 1. Interval placement of a right-sided percutaneous biliary drain, with a formed pigtail in the central right hepatic ducts. No fluid collection or other abnormality associated with the drain tract. 2. Diminished right-sided biliary ductal dilatation on today's examination. Similar appearance of left-sided biliary ductal dilatation. No directly visualized obstipating metastasis however there is ill-defined hypodensity in the central liver keeping with a known metastasis. 3. New inflammatory fat stranding about the pancreatic head and uncinate, consistent with acute pancreatitis. No acute pancreatic fluid collection. 4. New small volume perihepatic and perisplenic ascites. 5. Moderate right pleural effusion associated atelectasis or consolidation, increased compared to prior examination. Underlying mass of the dependent right lung base, similar in appearance. 6. Cholelithiasis. 7. Unchanged splenomegaly. 8. Splenic and gastroesophageal varices. Aortic Atherosclerosis (ICD10-I70.0). Electronically Signed   By: Delanna Ahmadi M.D.   On: 04/25/2022 13:37    Labs:  CBC: Recent Labs    04/22/22 0849 04/23/22 0542 04/23/22 1647 04/24/22 0500 04/24/22 1854 04/25/22 0333  WBC 10.7* 7.1  --  3.8*  --  3.8*  HGB 9.3* 8.5* 8.6* 8.1* 8.3* 8.1*  HCT 27.9* 25.8* 26.1* 24.5* 24.9* 24.2*  PLT 86* 79*  --  69*  --  77*    COAGS: Recent Labs    04/17/22 0841 04/24/22 0500  INR 1.1 1.2    BMP: Recent Labs    04/22/22 0849 04/23/22 0542  04/24/22 0500 04/25/22 0333  NA 133* 134* 134* 135  K 3.8 3.5 3.4* 3.5  CL 101 102 100 105  CO2 24 25 24 23   GLUCOSE 93 98 88 110*  BUN 26* 26* 20 16  CALCIUM 7.8* 7.9* 7.5* 7.7*  CREATININE 0.64 0.61 0.62 0.61  GFRNONAA >60 >60 >60 >60    LIVER FUNCTION TESTS: Recent Labs    04/22/22 0849 04/23/22 0542 04/24/22 0500 04/25/22 0812  BILITOT 20.0* 18.7* 12.8* 9.4*  AST 101* 81* 63* 52*  ALT 91* 73* 59* 47*  ALKPHOS 150* 142* 156* 148*  PROT 5.1* 4.9* 4.9* 4.8*  ALBUMIN 2.0* 1.9* 2.0* 2.0*    Assessment and Plan: Pt known to IR team from port a cath placement on 09/29/19. She has a PMH sig for hypothyroidism, GE varices, pancreatitis, thrombocytopenia, portal HTN, anemia, arthritis, fatty liver, and metastatic rectal cancer initially diagnosed in 2019. She now has obstructive jaundice from biliary obstruction and underwent stent placement by GI on 04/18/22; s/p stent removal on 3/22 for displacement; s/p rt ext biliary drain 3/22; afebrile, 3/26 labs include WBC 3.8, hgb 8.1, plts 77k, PT 15.1/ INR 1.2, creat 0.61, t bili 9.4(12.8); CT abd yesterday:    1. Interval placement of a right-sided percutaneous biliary drain, with a formed pigtail in the central right hepatic ducts. No fluid collection or other abnormality associated with the drain tract. 2. Diminished right-sided biliary ductal dilatation on today's examination. Similar appearance of left-sided biliary ductal dilatation. No directly visualized obstipating metastasis however there is ill-defined hypodensity in the central liver keeping with a known metastasis. 3. New inflammatory fat stranding about the pancreatic head and uncinate, consistent with acute pancreatitis. No acute pancreatic fluid collection. 4. New small volume perihepatic and perisplenic ascites. 5. Moderate right pleural effusion associated atelectasis or consolidation, increased compared to prior examination. Underlying mass of the dependent right  lung base, similar in appearance. 6. Cholelithiasis. 7. Unchanged splenomegaly. 8. Splenic and gastroesophageal varices.   Aortic Atherosclerosis    Latest imaging has been reviewed by Dr. Pascal Lux; plan is for cholangiogram, potential internalization of biliary drain, potential new biliary drain placements today with anesthesia assistance.Risks and benefits discussed with the patient/daughter  including bleeding, infection, damage to adjacent structures, anesthesia related complications,  and sepsis.   All of the patient's questions were answered, patient is agreeable to proceed. Consent signed and in chart   Electronically Signed: D. Rowe Robert, PA-C 04/26/2022, 8:19 AM   I spent a total of 20 minutes at the the patient's bedside AND on the patient's hospital floor or unit, greater than 50% of which was counseling/coordinating care for biliary drain    Patient ID: Samantha Lutz, female   DOB: 03-Sep-1950, 72 y.o.   MRN: [id number]

## 2022-04-27 ENCOUNTER — Inpatient Hospital Stay (HOSPITAL_COMMUNITY): Payer: Medicare Other

## 2022-04-27 ENCOUNTER — Encounter (HOSPITAL_COMMUNITY): Payer: Self-pay | Admitting: Interventional Radiology

## 2022-04-27 DIAGNOSIS — R008 Other abnormalities of heart beat: Secondary | ICD-10-CM | POA: Diagnosis not present

## 2022-04-27 DIAGNOSIS — K831 Obstruction of bile duct: Secondary | ICD-10-CM | POA: Diagnosis not present

## 2022-04-27 LAB — COMPREHENSIVE METABOLIC PANEL
ALT: 45 U/L — ABNORMAL HIGH (ref 0–44)
AST: 66 U/L — ABNORMAL HIGH (ref 15–41)
Albumin: 1.9 g/dL — ABNORMAL LOW (ref 3.5–5.0)
Alkaline Phosphatase: 193 U/L — ABNORMAL HIGH (ref 38–126)
Anion gap: 8 (ref 5–15)
BUN: 14 mg/dL (ref 8–23)
CO2: 23 mmol/L (ref 22–32)
Calcium: 7.4 mg/dL — ABNORMAL LOW (ref 8.9–10.3)
Chloride: 105 mmol/L (ref 98–111)
Creatinine, Ser: 0.51 mg/dL (ref 0.44–1.00)
GFR, Estimated: >60 mL/min (ref >60)
Glucose, Bld: 105 mg/dL — ABNORMAL HIGH (ref 70–99)
Potassium: 3.7 mmol/L (ref 3.5–5.1)
Sodium: 136 mmol/L (ref 135–145)
Total Bilirubin: 12.5 mg/dL — ABNORMAL HIGH (ref 0.3–1.2)
Total Protein: 4.7 g/dL — ABNORMAL LOW (ref 6.5–8.1)

## 2022-04-27 LAB — CBC WITH DIFFERENTIAL/PLATELET
Abs Immature Granulocytes: 0.21 10*3/uL — ABNORMAL HIGH (ref 0.00–0.07)
Basophils Absolute: 0 10*3/uL (ref 0.0–0.1)
Basophils Relative: 0 %
Eosinophils Absolute: 0 10*3/uL (ref 0.0–0.5)
Eosinophils Relative: 0 %
HCT: 27 % — ABNORMAL LOW (ref 36.0–46.0)
Hemoglobin: 8.9 g/dL — ABNORMAL LOW (ref 12.0–15.0)
Immature Granulocytes: 3 %
Lymphocytes Relative: 8 %
Lymphs Abs: 0.6 10*3/uL — ABNORMAL LOW (ref 0.7–4.0)
MCH: 34.8 pg — ABNORMAL HIGH (ref 26.0–34.0)
MCHC: 33 g/dL (ref 30.0–36.0)
MCV: 105.5 fL — ABNORMAL HIGH (ref 80.0–100.0)
Monocytes Absolute: 0.8 10*3/uL (ref 0.1–1.0)
Monocytes Relative: 12 %
Neutro Abs: 5.3 10*3/uL (ref 1.7–7.7)
Neutrophils Relative %: 77 %
Platelets: 115 10*3/uL — ABNORMAL LOW (ref 150–400)
RBC: 2.56 MIL/uL — ABNORMAL LOW (ref 3.87–5.11)
RDW: 19.1 % — ABNORMAL HIGH (ref 11.5–15.5)
WBC: 6.9 10*3/uL (ref 4.0–10.5)
nRBC: 0 % (ref 0.0–0.2)

## 2022-04-27 LAB — ECHOCARDIOGRAM COMPLETE
Area-P 1/2: 3.42 cm2
Calc EF: 79.2 %
Height: 62 in
S' Lateral: 2.2 cm
Single Plane A2C EF: 78.7 %
Single Plane A4C EF: 81.5 %
Weight: 3453.29 oz

## 2022-04-27 LAB — MAGNESIUM: Magnesium: 1.8 mg/dL (ref 1.7–2.4)

## 2022-04-27 MED ORDER — PANTOPRAZOLE SODIUM 40 MG PO TBEC
40.0000 mg | DELAYED_RELEASE_TABLET | Freq: Every day | ORAL | Status: DC
  Administered 2022-04-27 – 2022-05-04 (×8): 40 mg via ORAL
  Filled 2022-04-27 (×8): qty 1

## 2022-04-27 NOTE — Progress Notes (Signed)
  Echocardiogram 2D Echocardiogram has been performed.  Samantha Lutz 04/27/2022, 8:44 AM

## 2022-04-27 NOTE — Progress Notes (Signed)
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 Authorizing Provider  albuterol (PROVENTIL HFA) 108 (90 Base) MCG/ACT inhaler Inhale 2 puffs into the lungs every 6 (six) hours as needed for wheezing or shortness of breath. 08/03/20  Yes Owens Shark, NP  bisacodyl (DULCOLAX) 5 MG EC tablet Take 5 mg by mouth daily as needed for mild constipation or moderate constipation.   Yes [provider]  levothyroxine (SYNTHROID) 50 MCG tablet Take 1 tablet by mouth once daily Patient taking differently: Take 50 mcg by mouth daily before breakfast. 02/06/22  Yes Ladell Pier, MD  lidocaine-prilocaine (EMLA) cream Apply 1 Application topically as needed. Apply to port site 1-2 hours prior to use Patient taking differently: Apply 1 Application topically as needed (for port access- one to two hours prior to accessing). 12/12/21  Yes Owens Shark, NP  magic mouthwash SOLN Take 5 mLs by mouth 4 (four) times daily as needed for mouth pain. 5 ml swish and spit 4 times a daily as needed for mouth pain 09/14/20  Yes Ladell Pier, MD  magnesium oxide (MAG-OX) 400 (240 Mg) MG tablet Take 1 tablet (400 mg total) by mouth 2 (two) times daily. 02/22/22  Yes Ladell Pier, MD  Nutritional Supplements (ENSURE ORIGINAL) LIQD Take 237 mLs by mouth See admin instructions. Drink 237 ml's (1 CHOCOLATE shake) by mouth once a day   Yes [provider]  potassium chloride  (MICRO-K) 10 MEQ CR capsule Take 10 mEq by mouth daily.   Yes [provider]  predniSONE (DELTASONE) 5 MG tablet Take 1 tablet by mouth once daily with breakfast 03/15/22  Yes Ladell Pier, MD  prochlorperazine (COMPAZINE) 10 MG tablet Take 1 tablet (10 mg total) by mouth every 6 (six) hours as needed for nausea or vomiting. 03/01/21  Yes Owens Shark, NP  SYSTANE ULTRA PF 0.4-0.3 % SOLN Place 1 drop into both eyes 3 (three) times daily as needed (for dryness).   Yes [provider]  nystatin (MYCOSTATIN/NYSTOP) powder Apply 1 Application topically 3 (three) times daily. Patient not taking: Reported on 04/17/2022 12/12/21   Owens Shark, NP  Spacer/Aero-Holding Chambers (AEROCHAMBER MV) inhaler Use as instructed 12/31/19   Ladell Pier, MD  trifluridine-tipiracil (LONSURF) 20-8.19 MG tablet Take 2 tablets (40 mg Trifluridine) twice daily, take within 1 hr after AM & PM meals on days 1-5, 8-12. Repeat every 28 days. Start cycle on 04/10/22 Patient not taking: Reported on 04/17/2022 04/04/22   Ladell Pier, MD     Vital Signs: BP (!) 140/61 (BP Location: Right Arm)   Pulse 72   Temp 98.1 F (36.7 C) (Oral)   Resp 20   Ht 5\' 2"  (1.575 m)   Wt 215 lb 13.3 oz (97.9 kg)   LMP  (LMP Unknown)   SpO2 98%   BMI 39.48 kg/m   Physical Exam: awake/alert; jaundiced; rt biliary drain intact, insertion site ok, not sig tender; OP 220 cc green bile;  drain flushed without difficulty  Imaging: ECHOCARDIOGRAM COMPLETE  Result Date: 04/27/2022    ECHOCARDIOGRAM REPORT   Patient Name:   Samantha Lutz Date of Exam: 04/27/2022 Medical Rec #:  [id number]        Height:       62.0 in Accession #:    [id number]       Weight:       215.8 lb Date of Birth:  31-May-1950       BSA:          1.975 m Patient Age:    72 years         BP:           151/65 mmHg Patient Gender: F                HR:           74 bpm. Exam Location:  Inpatient Procedure: 2D Echo, 3D Echo, Cardiac Doppler, Color  Doppler and Strain Analysis Indications:    R00.8 Other abnormalities of heart beat  History:        Patient has no prior history of Echocardiogram examinations.                 Risk Factors:Hypertension. Metastatic cancer. Chemo.  Sonographer:    Roseanna Rainbow RDCS Referring Phys: [phone] A CALDWELL POWELL JR  Sonographer Comments: Technically difficult study due to poor echo windows, patient is obese, suboptimal subcostal window and suboptimal parasternal window. Image acquisition challenging due to patient body habitus. IMPRESSIONS  1. Left ventricular ejection fraction, by estimation, is 70 to 75%. The left ventricle has hyperdynamic function. The left ventricle has no regional wall motion abnormalities. Left ventricular diastolic parameters were normal.  2. Right ventricular systolic function is normal. The right ventricular size is normal. Tricuspid regurgitation signal is inadequate for assessing PA pressure.  3. The mitral valve is normal in structure. Trivial mitral valve regurgitation. No evidence of mitral stenosis.  4. The aortic valve was not well visualized. Aortic valve regurgitation is not visualized. No aortic stenosis is present.  5. The inferior vena cava is normal in size with greater than 50% respiratory variability, suggesting right atrial pressure of 3 mmHg. FINDINGS  Left Ventricle: Left ventricular ejection fraction, by estimation, is 70 to 75%. The left ventricle has hyperdynamic function. The left ventricle has no regional wall motion abnormalities. 3D ejection fraction reviewed and evaluated as part of the interpretation. Alternate measurement of EF is felt to be most reflective of LV function. The left ventricular internal cavity size was small. There is no left ventricular hypertrophy. Left ventricular diastolic parameters were normal. Right Ventricle: The right ventricular size is normal. No increase in right ventricular wall thickness. Right ventricular systolic function is normal.  Tricuspid regurgitation signal is inadequate for assessing PA pressure. Left Atrium: Left atrial size was normal in size. Right Atrium: Right atrial size was normal in size. Pericardium: There is no evidence of pericardial effusion. Mitral Valve: The mitral valve is normal in structure. Trivial mitral valve regurgitation. No evidence of mitral valve stenosis. Tricuspid Valve: The tricuspid valve is normal in structure. Tricuspid valve regurgitation is trivial. No evidence of tricuspid stenosis. Aortic Valve: The aortic valve was not well visualized. Aortic valve regurgitation is not visualized. No aortic stenosis is present. Pulmonic Valve: The pulmonic valve was normal in structure. Pulmonic valve regurgitation is not visualized. No evidence of pulmonic stenosis. Aorta: Linear echodensity in the ascending aorta most likely represents reverberation  artifact. The aortic root is normal in size and structure. Venous: The inferior vena cava is normal in size with greater than 50% respiratory variability, suggesting right atrial pressure of 3 mmHg. IAS/Shunts: The atrial septum is grossly normal.  LEFT VENTRICLE PLAX 2D LVIDd:         4.00 cm     Diastology LVIDs:         2.20 cm     LV e' medial:    7.51 cm/s LV PW:         1.00 cm     LV E/e' medial:  16.5 LV IVS:        0.90 cm     LV e' lateral:   7.83 cm/s LVOT diam:     2.10 cm     LV E/e' lateral: 15.8 LV SV:         94 LV SV Index:   47 LVOT Area:     3.46 cm  LV Volumes (MOD) LV vol d, MOD A2C: 58.3 ml LV vol d, MOD A4C: 56.3 ml LV vol s, MOD A2C: 12.4 ml LV vol s, MOD A4C: 10.4 ml LV SV MOD A2C:     45.9 ml LV SV MOD A4C:     56.3 ml LV SV MOD BP:      44.9 ml RIGHT VENTRICLE RV S prime:     13.10 cm/s TAPSE (M-mode): 2.0 cm LEFT ATRIUM             Index        RIGHT ATRIUM          Index LA diam:        3.40 cm 1.72 cm/m   RA Area:     8.52 cm LA Vol (A2C):   34.1 ml 17.27 ml/m  RA Volume:   14.00 ml 7.09 ml/m LA Vol (A4C):   31.2 ml 15.80 ml/m LA  Biplane Vol: 33.5 ml 16.97 ml/m  AORTIC VALVE LVOT Vmax:   118.00 cm/s LVOT Vmean:  79.500 cm/s LVOT VTI:    0.270 m  AORTA Ao Root diam: 2.80 cm Ao Asc diam:  3.30 cm MITRAL VALVE MV Area (PHT): 3.42 cm     SHUNTS MV Decel Time: 222 msec     Systemic VTI:  0.27 m MV E velocity: 124.00 cm/s  Systemic Diam: 2.10 cm MV A velocity: 128.00 cm/s MV E/A ratio:  0.97 Cherlynn Kaiser MD Electronically signed by Cherlynn Kaiser MD Signature Date/Time: 04/27/2022/2:32:06 PM    Final    IR CONVERT BILIARY DRAIN TO INT EXT BILIARY DRAIN  Result Date: 04/26/2022 INDICATION: History of metastatic colon cancer, now with malignant obstruction at the level of the biliary hilum. Patient underwent placement of an external biliary drainage catheter on 04/21/2022 however initial access was very challenging secondary to a combination lack of significant dilatation of the right intrahepatic biliary system as well as patient body habitus. Ultimately, a duct within the posterior aspect of the right lobe of the liver was cannulated however would not allow for placement of an internal/external drainage catheter secondary to angulation of the access site, confirmed on subsequent abdominal CT performed 04/25/2022. As such, patient presents today for attempted placement of a new right-sided biliary drainage catheter as well as potential placement of a left-sided biliary drainage catheter. EXAM: 1. FLUOROSCOPIC GUIDED PERCUTANEOUS TRANSHEPATIC CHOLANGIOGRAM AND BILIARY TUBE PLACEMENT VIA RIGHT-SIDED BILIARY APPROACH 2. ATTEMPTED THOUGH ULTIMATELY UNSUCCESSFUL TRAVERSAL INTO THE ATROPHIC LEFT INTRAHEPATIC BILIARY SYSTEM. COMPARISON:  CT abdomen and pelvis-04/25/2022; 04/13/2022; fluoroscopic images during right-sided external biliary drainage catheter placement-04/21/2022 MEDICATIONS: Ceftriaxone 2 g; the antibiotic was administered with an appropriate time frame prior to the initiation of the procedure CONTRAST:  48mL OMNIPAQUE IOHEXOL 300  MG/ML SOLN - administered into the biliary tree. ANESTHESIA/SEDATION: General anesthesia FLUOROSCOPY TIME:  [id number] mGy COMPLICATIONS: None immediate. TECHNIQUE: Informed written consent was obtained from the patient after a discussion of the risks, benefits and alternatives to treatment. Questions regarding the procedure were encouraged and answered. A timeout was performed prior to the initiation of the procedure. Patient was placed under general anesthesia via the anesthesia department Next, with the patient positioned slightly left lateral decubitus, the right upper abdominal quadrant was prepped and draped in the usual sterile fashion, and a sterile drape was applied covering the operative field. Maximum barrier sterile technique with sterile gowns and gloves were used for the procedure. A timeout was performed prior to the initiation of the procedure. The pre-existing suboptimally positioned right-sided external biliary drainage catheter was injected with a small amount of contrast demonstrating appropriate positioning with end coiled and locked within the central aspect of the right biliary tree with minimal opacification of the common bile duct. There is no definitive opacification of the left intrahepatic biliary system. Next, under direct fluoroscopic guidance, the access site of the suboptimally positioned biliary drainage catheter was targeted with a Simpson needle. Appropriate access to the biliary system was confirmed with a reflux of a small amount of bile as well as advancement of the Nitrex wire along the pre-existing drainage catheter to the level of the biliary hilum. The track was dilated with an Accustick set. A stiff glidewire and a 4 French angled glide catheter were advanced through the outer sheath of the Accustick set to the level of the horizontal segment of the duodenum. Contrast injection confirmed appropriate positioning. Next, the pre-existing biliary drainage catheter was removed  intact. At this time, the 4 French angled glide catheter was exchanged for a 7 [place], 35 cm radiopaque tip vascular sheath which was advanced to the level of the biliary hilum. Contrast injection demonstrated faint opacification of the central aspect of the left intrahepatic biliary tree. Despite prolonged efforts, the obstructing lesion at the level of the biliary hilum could not be traversed. Sonographic evaluation was performed of the abdomen and demonstrated a markedly atrophic left lobe of the liver, poorly visualized sonographically and noted to be immediately adjacent to the cardiac apex. There is consideration for direct sticking of the central aspect of the left lobe of the liver however this ultimately was deferred secondary to proximity of the cardiac apex. At this time, the vascular sheath was exchanged for a 10 [place] biliary drainage catheter with end coiled and locked within the duodenum with radiopaque side marker located at the level of the biliary hilum. Contrast injection was performed and the procedure was terminated The biliary drainage catheter was secured at the skin entrance site within interrupted suture, flushed with a small amount of saline and connected to a gravity bag. The patient was extubated and tolerated the procedure well without immediate postprocedural complication. FINDINGS: Contrast injection of pre-existing external biliary drainage catheter demonstrates appropriate positioning with end coiled and locked within the central aspect of the right biliary tree. There is minimal opacification of the common bile duct without definitive opacification of the left intrahepatic biliary tree. Under fluoroscopic guidance, the access site of the pre-existing biliary drainage catheter was cannulated at a more  favorable angle ultimately allowing placement of a internal/external 10 [place] biliary drainage catheter with end coiled within the horizontal segment of the duodenum and radiopaque  side marker at the level of the biliary hilum As above, there is faint opacification of the central aspect of the atrophic left intrahepatic biliary tree however this could not be successfully traversed percutaneously from the right-sided approach There was consideration for accessing the atrophic left lobe with ultrasound and/or fluoroscopic guidance however this ultimately was deferred secondary to pre-existing marked atrophy of the left lobe as well proximity of the cardiac apex. IMPRESSION: 1. Successful percutaneous placement of a new internal/external 10 [place] biliary drainage catheter via a more favorable approach with end coiled within the duodenum and radiopaque side marker at the level of the central aspect of the right intrahepatic biliary tree. 2. Faint opacification of the central most aspect of the atrophic left intrahepatic biliary system. The left intrahepatic biliary system was unable to be traversed percutaneously from the right-sided approach secondary to malignant obstruction of the level of the hilum. Consideration for either sonographic or fluoroscopic puncture of the atrophic left intrahepatic biliary tree was deferred secondary to marked atrophy of the left lobe of the liver and proximity of the cardiac apex. PLAN: Recommend maintaining the new internal/external biliary drainage catheter to external drainage realizing the output will be variable as some bile may be drain internally. Once the patient's bilirubin reaches a steady nadir, consideration for initiating a capping trial may be performed as indicated. Note, given central location of the biliary obstruction, the patient is likely NOT a candidate for internal biliary stent placement. Electronically Signed   By: Sandi Mariscal M.D.   On: 04/26/2022 18:03   DG CHEST PORT 1 VIEW  Result Date: 04/25/2022 CLINICAL DATA:  Chest pain and shortness of breath EXAM: PORTABLE CHEST 1 VIEW COMPARISON:  12/31/2019, CT from earlier in the same day.  FINDINGS: Cardiac shadow is stable. Right chest wall port is again noted. Left lung is clear with the exception of nodules in the upper lobe similar to that seen on previous exams. Right-sided pleural effusion with underlying consolidation is noted similar to that seen on CT from earlier in the same day. No other focal abnormality is noted. IMPRESSION: Persistent right-sided effusion and underlying consolidation. Stable left upper lobe pulmonary nodules. Electronically Signed   By: Inez Catalina M.D.   On: 04/25/2022 19:18   CT ABDOMEN W CONTRAST  Result Date: 04/25/2022 CLINICAL DATA:  Metastatic colon cancer, biliary obstruction suspected, status post stent placement, pain around drainage site * Tracking Code: BO * EXAM: CT ABDOMEN WITH CONTRAST TECHNIQUE: Multidetector CT imaging of the abdomen was performed using the standard protocol following bolus administration of intravenous contrast. RADIATION DOSE REDUCTION: This exam was performed according to the departmental dose-optimization program which includes automated exposure control, adjustment of the mA and/or kV according to patient size and/or use of iterative reconstruction technique. CONTRAST:  146mL OMNIPAQUE IOHEXOL 300 MG/ML  SOLN COMPARISON:  CT abdomen pelvis, 04/13/2022 FINDINGS: Lower chest: Moderate right pleural effusion associated atelectasis or consolidation, increased compared to prior examination. Underlying mass of the dependent right lung base, similar in appearance. Hepatobiliary: Interval placement of a right-sided percutaneous biliary drain, with a formed pigtail in the central right hepatic ducts (series 2, image 27). No fluid collection or other abnormality associated with the drain tract. Diminished right-sided biliary ductal dilatation on today's examination. Similar appearance of left-sided biliary ductal dilatation (series 2, image 23). No directly  visualized obstipated metastasis however there is ill-defined hypodensity in the  central liver (series 2, image 23). Numerous small gallstones and excreted contrast in the gallbladder. Pancreas: New inflammatory fat stranding about the pancreatic head and uncinate (series 2, image 41). No acute pancreatic fluid collection. Spleen: Unchanged splenomegaly, maximum span 14.4 cm. Adrenals/Urinary Tract: Adrenal glands are unremarkable. Kidneys are normal, without renal calculi, solid lesion, or hydronephrosis. Stomach/Bowel: Stomach is within normal limits. No evidence of bowel wall thickening, distention, or inflammatory changes. Vascular/Lymphatic: Aortic atherosclerosis. Splenic and gastroesophageal varices (series 2, image 21). No enlarged abdominal lymph nodes. Other: No abdominal wall hernia or abnormality. New small volume perihepatic and perisplenic ascites. Musculoskeletal: No acute or significant osseous findings. IMPRESSION: 1. Interval placement of a right-sided percutaneous biliary drain, with a formed pigtail in the central right hepatic ducts. No fluid collection or other abnormality associated with the drain tract. 2. Diminished right-sided biliary ductal dilatation on today's examination. Similar appearance of left-sided biliary ductal dilatation. No directly visualized obstipating metastasis however there is ill-defined hypodensity in the central liver keeping with a known metastasis. 3. New inflammatory fat stranding about the pancreatic head and uncinate, consistent with acute pancreatitis. No acute pancreatic fluid collection. 4. New small volume perihepatic and perisplenic ascites. 5. Moderate right pleural effusion associated atelectasis or consolidation, increased compared to prior examination. Underlying mass of the dependent right lung base, similar in appearance. 6. Cholelithiasis. 7. Unchanged splenomegaly. 8. Splenic and gastroesophageal varices. Aortic Atherosclerosis (ICD10-I70.0). Electronically Signed   By: Delanna Ahmadi M.D.   On: 04/25/2022 13:37     Labs:  CBC: Recent Labs    04/23/22 0542 04/23/22 1647 04/24/22 0500 04/24/22 1854 04/25/22 0333 04/27/22 0500  WBC 7.1  --  3.8*  --  3.8* 6.9  HGB 8.5*   < > 8.1* 8.3* 8.1* 8.9*  HCT 25.8*   < > 24.5* 24.9* 24.2* 27.0*  PLT 79*  --  69*  --  77* 115*   < > = values in this interval not displayed.    COAGS: Recent Labs    04/17/22 0841 04/24/22 0500  INR 1.1 1.2    BMP: Recent Labs    04/23/22 0542 04/24/22 0500 04/25/22 0333 04/27/22 0500  NA 134* 134* 135 136  K 3.5 3.4* 3.5 3.7  CL 102 100 105 105  CO2 25 24 23 23   GLUCOSE 98 88 110* 105*  BUN 26* 20 16 14   CALCIUM 7.9* 7.5* 7.7* 7.4*  CREATININE 0.61 0.62 0.61 0.51  GFRNONAA >60 >60 >60 >60    LIVER FUNCTION TESTS: Recent Labs    04/23/22 0542 04/24/22 0500 04/25/22 0812 04/27/22 0500  BILITOT 18.7* 12.8* 9.4* 12.5*  AST 81* 63* 52* 66*  ALT 73* 59* 47* 45*  ALKPHOS 142* 156* 148* 193*  PROT 4.9* 4.9* 4.8* 4.7*  ALBUMIN 1.9* 2.0* 2.0* 1.9*    Assessment and Plan: 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, biliary obstruction; s/p placement of right ext biliary drain 04/21/22 with subsequent removal and placement of new I/E rt biliary drain 04/26/22; the left intrahepatic biliary system was unable to be traversed percutaneously from the right-sided approach secondary to malignant obstruction of the level of the hilum; afebrile, WBC nl, hgb 8.9(8.1), t bili 12.5(9.4); as per Dr. Pascal Lux note yesterday-  Recommend maintaining the new internal/external biliary drainage catheter to external drainage realizing the output will be variable as some bile may be drain internally.   Once the patient's bilirubin reaches a  steady nadir, consideration for initiating a capping trial may be performed as indicated.   Note, given central location of the biliary obstruction, the patient is likely NOT a candidate for internal biliary stent placement.  Cont drain flushes every 8 hrs, dressing changes every 2-3 days,  lab checks; consider PPI/H2 blocker for epigastric discomfort  Electronically Signed: D. Rowe Robert, PA-C 04/27/2022, 3:11 PM   I spent a total of 15 Minutes at the the patient's bedside AND on the patient's hospital floor or unit, greater than 50% of which was counseling/coordinating care for biliary drain   Patient ID: Samantha Lutz, female   DOB: 07-Dec-1950, 72 y.o.   MRN: [id number]

## 2022-04-27 NOTE — Care Management Important Message (Signed)
Important Message  Patient Details IM Letter given. Name: STASI LANDMARK MRN: [id number] Date of Birth: 1950-03-20   Medicare Important Message Given:  Yes     Kerin Salen 04/27/2022, 9:21 AM

## 2022-04-27 NOTE — Anesthesia Postprocedure Evaluation (Signed)
Anesthesia Post Note  Patient: Samantha Lutz  Procedure(s) Performed: PERCUTANEOUS TRANSHEPATIC  CHOLANGIOGRAM     Patient location during evaluation: PACU Anesthesia Type: General Level of consciousness: awake and alert Pain management: pain level controlled Vital Signs Assessment: post-procedure vital signs reviewed and stable Respiratory status: spontaneous breathing, nonlabored ventilation, respiratory function stable and patient connected to nasal cannula oxygen Cardiovascular status: blood pressure returned to baseline and stable Postop Assessment: no apparent nausea or vomiting Anesthetic complications: no  No notable events documented.  Last Vitals:  Vitals:   04/26/22 2029 04/27/22 0517  BP: (!) 159/74 (!) 151/65  Pulse: 74 73  Resp: 18 18  Temp: 36.8 C 36.8 C  SpO2: 97% 98%    Last Pain:  Vitals:   04/27/22 0521  TempSrc:   PainSc: Asleep                 Nilo Fallin L Viki Carrera

## 2022-04-27 NOTE — Progress Notes (Signed)
Progress Note    Samantha Lutz   [id number]  DOB: [birth year]  DOA: 04/17/2022     10 PCP: Samantha Lefort, FNP  Initial CC: elevated total [facility] Course: [name] is a 72 yo female with PMH rectal cancer with liver metastasis who presented with mild abdominal discomfort and outpatient elevated total bilirubin.  This has been trended and found to not be resolving.  MRI liver was obtained on 04/14/2022 which showed underlying hypoenhancing central liver mass measuring 2.7 x 1.9 cm compatible with solitary recurrent liver metastasis.  Also showed underlying associated high-grade malignant biliary hilar stricture.  She was admitted for GI evaluation and expedited endoscopy for further evaluation.  She underwent ERCP and stent placement 3/19. Hospitalization complicated by post ERCP pancreatitis and rising LFT's. GI removed biliary stent on 3/22 and IR was consulted for external biliary drain. She underwent fluoro guided right sided transhepatic biliary drainage catheter with IR on 04/26/2022.  Interval History:  No events overnight.  Left-sided abdominal pain has continued to improve some.  Tolerating food as well.  Denies any nausea/vomiting. Right-sided biliary drain still having output.  Assessment and Plan: * Biliary obstruction - Underwent MRI liver on 04/14/2022 showing high-grade malignant biliary hilar stricture - s/p ERCP 3/19 with localized biliary strictures found in the hepatitic duct system from extrahepatic mestastasis.  Strictures malignant appearing.  Treated with stent placement.  - s/p unasyn - MRCP with biliary stent extending from duodenum to vicinity of central mass near the confluence of the R and L hepatic ducts - mild right and L intrahepatic biliary dilatation indicates that the stent may possibly not be transversing the mass or maybe only partially transversing the mass - L hepatic lobe biliary dilatation is mildly improved but not resolved.   -  removal of biliary stent 3/22 - s/p external biliary drain in right hepatic bile duct 3/22, difficult placement, bleeding when drain/catheter not in place (due to intercostal vessel), treated with liod with epi and pursestring suture - no bleeding with drain in place.  Per Rads, concern that hyperbili not due to biliary obstruction as CBD patent to level of duodenum, consider internalization if bilirubin improves with external drainage. - CT abd with contrast 3/26 with R sided perc biliary drain with formed pigtail in central right hepatic ducts.  Diminished R sided biliary ductal dilatation.  Similar appearance of L sided biliary ductal dilatation.  Ill defined hypodensity in central liver.   - GI now signed off, may take time for bili to normalize - s/p fluoroscopy guided right sided approach biliary drainage catheter placed in IR on 04/26/2022; removal of initial biliary drain -Continue flushing drain 3 times daily   Rectal cancer [id number] (1/23 LN) s/p neoadj chemoXRT, robotic LAR resection & diverting loop ileostomy 12/21/2017 - Follows closely outpatient with oncology; has been needed with [facility]; cycle 2 placed on hold recently due to hyperTB  Hypothyroid - Continue Synthroid  Hypertension - relatively controlled -No home meds noted on med rec  Post-ERCP acute pancreatitis-resolved as of 04/27/2022 -CT with persistent pancreatitis, symptoms have improved   Volume Overload  Perihepatic and Perisplenic Ascites  Right Sided Pleural Effusion Follow CXR and echo  Will start some lasix  Strict I/O, daily weights - up 14 lbs since admission, net positive 10 L    Anemia - downtrending, will follow - relatively stable today (notably difficult placement of external biliary drain, bleeding noted) - will continue to trend   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  Vein Thrombosis - no need for anticoagulation per discussion with oncology   Leukocytosis  Fever - due to above, s/p abx - resolved    Hyponatremia - improved, hold further LR   T8 Compression Fracture - noted, follow   Old records reviewed in assessment of this patient  Antimicrobials: Unasyn 04/18/22 >> 04/25/22  DVT prophylaxis:  enoxaparin (LOVENOX) injection 40 mg Start: 04/27/22 1000 SCDs Start: 04/17/22 1934   Code Status:   Code Status: Full Code  Mobility Assessment (last 72 hours)     Mobility Assessment     Row Name 04/27/22 [id number] 04/25/22 2015 04/25/22 0945       Does patient have an order for bedrest or is patient medically unstable No - Continue assessment No - Continue assessment No - Continue assessment     What is the highest level of mobility based on the progressive mobility assessment? Level 6 (Walks independently in room and hall) - Balance while walking in room without assist - Complete Level 6 (Walks independently in room and hall) - Balance while walking in room without assist - Complete Level 6 (Walks independently in room and hall) - Balance while walking in room without assist - Complete              Barriers to discharge:  Disposition Plan:  Home Status is: Inpt  Objective: Blood pressure (!) 151/65, pulse 73, temperature 98.2 F (36.8 C), temperature source Oral, resp. rate 18, height 5\' 2"  (1.575 m), weight 97.9 kg, SpO2 98 %.  Examination:  Physical Exam Constitutional:      Appearance: Normal appearance.  HENT:     Head: Normocephalic and atraumatic.     Mouth/Throat:     Mouth: Mucous membranes are moist.  Eyes:     Extraocular Movements: Extraocular movements intact.  Cardiovascular:     Rate and Rhythm: Normal rate and regular rhythm.  Pulmonary:     Effort: Pulmonary effort is normal.     Breath sounds: Normal breath sounds.  Abdominal:     General: Bowel sounds are normal. There is no distension.     Palpations: Abdomen is soft.     Tenderness: There is no abdominal tenderness.     Comments: Right side noted with biliary drain in place   Musculoskeletal:        General: Normal range of motion.     Cervical back: Normal range of motion and neck supple.  Skin:    General: Skin is warm and dry.  Neurological:     General: No focal deficit present.     Mental Status: She is alert.  Psychiatric:        Mood and Affect: Mood normal.        Behavior: Behavior normal.      Consultants:  GI IR  Procedures:    Data Reviewed: Results for orders placed or performed during the hospital encounter of 04/17/22 (from the past 24 hour(s))  CBC with Differential/Platelet     Status: Abnormal   Collection Time: 04/27/22  5:00 AM  Result Value Ref Range   WBC 6.9 4.0 - 10.5 K/uL   RBC 2.56 (L) 3.87 - 5.11 MIL/uL   Hemoglobin 8.9 (L) 12.0 - 15.0 g/dL   HCT 27.0 (L) 36.0 - 46.0 %   MCV 105.5 (H) 80.0 - 100.0 fL   MCH 34.8 (H) 26.0 - 34.0 pg   MCHC 33.0 30.0 - 36.0 g/dL   RDW 19.1 (H) 11.5 - 15.5 %  Platelets 115 (L) 150 - 400 K/uL   nRBC 0.0 0.0 - 0.2 %   Neutrophils Relative % 77 %   Neutro Abs 5.3 1.7 - 7.7 K/uL   Lymphocytes Relative 8 %   Lymphs Abs 0.6 (L) 0.7 - 4.0 K/uL   Monocytes Relative 12 %   Monocytes Absolute 0.8 0.1 - 1.0 K/uL   Eosinophils Relative 0 %   Eosinophils Absolute 0.0 0.0 - 0.5 K/uL   Basophils Relative 0 %   Basophils Absolute 0.0 0.0 - 0.1 K/uL   Immature Granulocytes 3 %   Abs Immature Granulocytes 0.21 (H) 0.00 - 0.07 K/uL  Comprehensive metabolic panel     Status: Abnormal   Collection Time: 04/27/22  5:00 AM  Result Value Ref Range   Sodium 136 135 - 145 mmol/L   Potassium 3.7 3.5 - 5.1 mmol/L   Chloride 105 98 - 111 mmol/L   CO2 23 22 - 32 mmol/L   Glucose, Bld 105 (H) 70 - 99 mg/dL   BUN 14 8 - 23 mg/dL   Creatinine, Ser 0.51 0.44 - 1.00 mg/dL   Calcium 7.4 (L) 8.9 - 10.3 mg/dL   Total Protein 4.7 (L) 6.5 - 8.1 g/dL   Albumin 1.9 (L) 3.5 - 5.0 g/dL   AST 66 (H) 15 - 41 U/L   ALT 45 (H) 0 - 44 U/L   Alkaline Phosphatase 193 (H) 38 - 126 U/L   Total Bilirubin 12.5 (H) 0.3 -  1.2 mg/dL   GFR, Estimated >60 >60 mL/min   Anion gap 8 5 - 15  Magnesium     Status: None   Collection Time: 04/27/22  5:00 AM  Result Value Ref Range   Magnesium 1.8 1.7 - 2.4 mg/dL     I have reviewed pertinent nursing notes, vitals, labs, and images as necessary. I have ordered labwork to follow up on as indicated.  I have reviewed the last notes from staff over past 24 hours. I have discussed patient's care plan and test results with nursing staff, CM/SW, and other staff as appropriate.  Time spent: Greater than 50% of the 55 minute visit was spent in counseling/coordination of care for the patient as laid out in the A&P.   LOS: 10 days   Dwyane Dee, MD [facility] 04/27/2022, 1:38 PM

## 2022-04-28 ENCOUNTER — Other Ambulatory Visit (HOSPITAL_COMMUNITY): Payer: Self-pay

## 2022-04-28 DIAGNOSIS — K831 Obstruction of bile duct: Secondary | ICD-10-CM | POA: Diagnosis not present

## 2022-04-28 LAB — COMPREHENSIVE METABOLIC PANEL
ALT: 53 U/L — ABNORMAL HIGH (ref 0–44)
AST: 76 U/L — ABNORMAL HIGH (ref 15–41)
Albumin: 2.3 g/dL — ABNORMAL LOW (ref 3.5–5.0)
Alkaline Phosphatase: 218 U/L — ABNORMAL HIGH (ref 38–126)
Anion gap: 8 (ref 5–15)
BUN: 11 mg/dL (ref 8–23)
CO2: 26 mmol/L (ref 22–32)
Calcium: 8.1 mg/dL — ABNORMAL LOW (ref 8.9–10.3)
Chloride: 101 mmol/L (ref 98–111)
Creatinine, Ser: 0.56 mg/dL (ref 0.44–1.00)
GFR, Estimated: >60 mL/min (ref >60)
Glucose, Bld: 100 mg/dL — ABNORMAL HIGH (ref 70–99)
Potassium: 4.1 mmol/L (ref 3.5–5.1)
Sodium: 135 mmol/L (ref 135–145)
Total Bilirubin: 17.3 mg/dL — ABNORMAL HIGH (ref 0.3–1.2)
Total Protein: 5.4 g/dL — ABNORMAL LOW (ref 6.5–8.1)

## 2022-04-28 LAB — CBC WITH DIFFERENTIAL/PLATELET
Abs Immature Granulocytes: 0.08 10*3/uL — ABNORMAL HIGH (ref 0.00–0.07)
Basophils Absolute: 0 10*3/uL (ref 0.0–0.1)
Basophils Relative: 0 %
Eosinophils Absolute: 0 10*3/uL (ref 0.0–0.5)
Eosinophils Relative: 1 %
HCT: 26.6 % — ABNORMAL LOW (ref 36.0–46.0)
Hemoglobin: 8.9 g/dL — ABNORMAL LOW (ref 12.0–15.0)
Immature Granulocytes: 2 %
Lymphocytes Relative: 7 %
Lymphs Abs: 0.4 10*3/uL — ABNORMAL LOW (ref 0.7–4.0)
MCH: 35.2 pg — ABNORMAL HIGH (ref 26.0–34.0)
MCHC: 33.5 g/dL (ref 30.0–36.0)
MCV: 105.1 fL — ABNORMAL HIGH (ref 80.0–100.0)
Monocytes Absolute: 0.7 10*3/uL (ref 0.1–1.0)
Monocytes Relative: 14 %
Neutro Abs: 3.9 10*3/uL (ref 1.7–7.7)
Neutrophils Relative %: 76 %
Platelets: 114 10*3/uL — ABNORMAL LOW (ref 150–400)
RBC: 2.53 MIL/uL — ABNORMAL LOW (ref 3.87–5.11)
RDW: 19.1 % — ABNORMAL HIGH (ref 11.5–15.5)
WBC: 5.2 10*3/uL (ref 4.0–10.5)
nRBC: 0 % (ref 0.0–0.2)

## 2022-04-28 LAB — MAGNESIUM: Magnesium: 2 mg/dL (ref 1.7–2.4)

## 2022-04-28 MED ORDER — BISACODYL 5 MG PO TBEC: 5.0000 mg | DELAYED_RELEASE_TABLET | Freq: Every day | ORAL | Status: DC | PRN

## 2022-04-28 NOTE — Progress Notes (Signed)
Mobility Specialist - Progress Note   04/28/22 1028  Mobility  Activity Ambulated with assistance in hallway  Level of Assistance Contact guard assist, steadying assist  Assistive Device None  Distance Ambulated (ft) 500 ft  Activity Response Tolerated well  Mobility Referral Yes  $Mobility charge 1 Mobility   Pt received in bed and agreed to mobility. Had no issues throughout session, pt returned to bed with all needs met.   Roderick Pee Mobility Specialist

## 2022-04-28 NOTE — Progress Notes (Addendum)
IP PROGRESS NOTE  Subjective:   Ms. Samantha Lutz underwent underwent placement of a new internal/external biliary drain with the end in the duodenum on 04/26/2022.  She continues to have intermittent right abdominal pain.  No other complaint. She is ambulating. Objective: Vital signs in last 24 hours: Blood pressure 135/62, pulse 88, temperature 98.2 F (36.8 C), temperature source Oral, resp. rate 17, height 5\' 2"  (1.575 m), weight 217 lb 13 oz (98.8 kg), SpO2 96 %.  Intake/Output from previous day: 03/28 0701 - 03/29 0700 In: 130 [P.O.:120] Out: 2055 [Urine:1900; Drains:155]  Physical Exam:  HEENT: Scleral icterus Lungs: Decreased breath sounds at the right posterior chest, no respiratory distress Cardiac: Regular rate and rhythm Abdomen: Soft, no hepatomegaly, no tenderness in the right upper abdomen, right chest biliary drain site with a surrounding ecchymosis  Extremities: No leg edema Skin: Jaundice  Portacath/PICC-without erythema  Lab Results: Recent Labs    04/27/22 0500 04/28/22 0500  WBC 6.9 5.2  HGB 8.9* 8.9*  HCT 27.0* 26.6*  PLT 115* 114*    BMET Recent Labs    04/27/22 0500 04/28/22 0500  NA 136 135  K 3.7 4.1  CL 105 101  CO2 23 26  GLUCOSE 105* 100*  BUN 14 11  CREATININE 0.51 0.56  CALCIUM 7.4* 8.1*    Lab Results  Component Value Date   CEA1 <1.00 06/01/2020   CEA <1.00 06/01/2020    Studies/Results: ECHOCARDIOGRAM COMPLETE  Result Date: 04/27/2022    ECHOCARDIOGRAM REPORT   Patient Name:   Samantha Lutz Date of Exam: 04/27/2022 Medical Rec #:  [id number]        Height:       62.0 in Accession #:    [id number]       Weight:       215.8 lb Date of Birth:  03/28/1950       BSA:          1.975 m Patient Age:    72 years         BP:           151/65 mmHg Patient Gender: F                HR:           74 bpm. Exam Location:  Inpatient Procedure: 2D Echo, 3D Echo, Cardiac Doppler, Color Doppler and Strain Analysis Indications:    R00.8 Other  abnormalities of heart beat  History:        Patient has no prior history of Echocardiogram examinations.                 Risk Factors:Hypertension. Metastatic cancer. Chemo.  Sonographer:    Roseanna Rainbow RDCS Referring Phys: [phone] A CALDWELL POWELL JR  Sonographer Comments: Technically difficult study due to poor echo windows, patient is obese, suboptimal subcostal window and suboptimal parasternal window. Image acquisition challenging due to patient body habitus. IMPRESSIONS  1. Left ventricular ejection fraction, by estimation, is 70 to 75%. The left ventricle has hyperdynamic function. The left ventricle has no regional wall motion abnormalities. Left ventricular diastolic parameters were normal.  2. Right ventricular systolic function is normal. The right ventricular size is normal. Tricuspid regurgitation signal is inadequate for assessing PA pressure.  3. The mitral valve is normal in structure. Trivial mitral valve regurgitation. No evidence of mitral stenosis.  4. The aortic valve was not well visualized. Aortic valve regurgitation is not visualized. No aortic stenosis is present.  5. The inferior vena cava is normal in size with greater than 50% respiratory variability, suggesting right atrial pressure of 3 mmHg. FINDINGS  Left Ventricle: Left ventricular ejection fraction, by estimation, is 70 to 75%. The left ventricle has hyperdynamic function. The left ventricle has no regional wall motion abnormalities. 3D ejection fraction reviewed and evaluated as part of the interpretation. Alternate measurement of EF is felt to be most reflective of LV function. The left ventricular internal cavity size was small. There is no left ventricular hypertrophy. Left ventricular diastolic parameters were normal. Right Ventricle: The right ventricular size is normal. No increase in right ventricular wall thickness. Right ventricular systolic function is normal. Tricuspid regurgitation signal is inadequate for assessing PA  pressure. Left Atrium: Left atrial size was normal in size. Right Atrium: Right atrial size was normal in size. Pericardium: There is no evidence of pericardial effusion. Mitral Valve: The mitral valve is normal in structure. Trivial mitral valve regurgitation. No evidence of mitral valve stenosis. Tricuspid Valve: The tricuspid valve is normal in structure. Tricuspid valve regurgitation is trivial. No evidence of tricuspid stenosis. Aortic Valve: The aortic valve was not well visualized. Aortic valve regurgitation is not visualized. No aortic stenosis is present. Pulmonic Valve: The pulmonic valve was normal in structure. Pulmonic valve regurgitation is not visualized. No evidence of pulmonic stenosis. Aorta: Linear echodensity in the ascending aorta most likely represents reverberation artifact. The aortic root is normal in size and structure. Venous: The inferior vena cava is normal in size with greater than 50% respiratory variability, suggesting right atrial pressure of 3 mmHg. IAS/Shunts: The atrial septum is grossly normal.  LEFT VENTRICLE PLAX 2D LVIDd:         4.00 cm     Diastology LVIDs:         2.20 cm     LV e' medial:    7.51 cm/s LV PW:         1.00 cm     LV E/e' medial:  16.5 LV IVS:        0.90 cm     LV e' lateral:   7.83 cm/s LVOT diam:     2.10 cm     LV E/e' lateral: 15.8 LV SV:         94 LV SV Index:   47 LVOT Area:     3.46 cm  LV Volumes (MOD) LV vol d, MOD A2C: 58.3 ml LV vol d, MOD A4C: 56.3 ml LV vol s, MOD A2C: 12.4 ml LV vol s, MOD A4C: 10.4 ml LV SV MOD A2C:     45.9 ml LV SV MOD A4C:     56.3 ml LV SV MOD BP:      44.9 ml RIGHT VENTRICLE RV S prime:     13.10 cm/s TAPSE (M-mode): 2.0 cm LEFT ATRIUM             Index        RIGHT ATRIUM          Index LA diam:        3.40 cm 1.72 cm/m   RA Area:     8.52 cm LA Vol (A2C):   34.1 ml 17.27 ml/m  RA Volume:   14.00 ml 7.09 ml/m LA Vol (A4C):   31.2 ml 15.80 ml/m LA Biplane Vol: 33.5 ml 16.97 ml/m  AORTIC VALVE LVOT Vmax:   118.00  cm/s LVOT Vmean:  79.500 cm/s LVOT VTI:    0.270 m  AORTA Ao Root diam: 2.80 cm Ao Asc diam:  3.30 cm MITRAL VALVE MV Area (PHT): 3.42 cm     SHUNTS MV Decel Time: 222 msec     Systemic VTI:  0.27 m MV E velocity: 124.00 cm/s  Systemic Diam: 2.10 cm MV A velocity: 128.00 cm/s MV E/A ratio:  0.97 Cherlynn Kaiser MD Electronically signed by Cherlynn Kaiser MD Signature Date/Time: 04/27/2022/2:32:06 PM    Final     Medications: I have reviewed the patient's current medications.  Assessment/Plan: Rectal cancer Mass at 7 cm from the anal verge on colonoscopy 08/09/2017, biopsy revealed invasive adenocarcinoma Staging CTs 08/17/2017-no evidence of metastatic disease, asymmetric thickening in the mid rectum MR pelvis 09/01/2017, T3N0 lesion beginning at 6.3 cm from the anal sphincter Radiation/Xeloda initiated 09/17/2017, completed 10/25/2017 Low anterior resection/diverting ileostomy 12/21/2017,ypT3,ypN1a tumor.  Lymphovascular invasion present, intact mismatch repair protein expression, treatment effect present (TRS 1).  Mismatch repair protein IHC normal; Foundation 1-KRAS/NRAS wild-type, microsatellite status and tumor mutational burden could not be determined. Cycle 1 adjuvant Xeloda beginning 01/21/2018 Cycle 2 adjuvant Xeloda beginning 02/11/2018 Xeloda discontinued after cycle 2 secondary to patient preference Ileostomy takedown 07/10/2018 CTs 09/07/2018- multiple live small pulmonary nodules concerning for metastatic disease CT chest 12/10/2018-multiple bilateral lung nodules, some have increased in size CT chest 04/08/2019-mild enlargement of bilateral lung nodules Status post SBRT multiple lung nodules 05/06/2019, 05/08/2019, 05/13/2019 CT chest 08/28/2019-improvement and resolution in majority of right-sided pulmonary nodules, a superior segment right lower lobe nodule has increased, no new right-sided nodules.  Progressive enlargement of left-sided pulmonary nodules PET scan 09/09/2019-hypermetabolic right  lower lobe nodule, 2 hypermetabolic left upper lobe nodules with an additional 0.7 cm left upper lobe nodule below PET resolution, groundglass opacity in the mid to right lower lobe with associated hypermetabolism consistent with postradiation change, hypermetabolic central segment 4A liver lesion Cycle 1 FOLFOX 10/14/2019 Cycle  2FOLFOX 11/11/2019, 5-FU and oxaliplatin dose reduced secondary to neutropenia and thrombocytopenia following cycle one, G-CSF declined Cycle 3 FOLFOX 11/26/2019 Cycle 4 FOLFOX 12/11/2019, oxaliplatin held due to neutropenia and thrombocytopenia Cycle 5 FOLFOX 12/31/2019 Cycle 6 FOLFOX 01/14/2020 (oxaliplatin held, 5-fluorouracil dose reduced due to mucositis) CT chest 01/27/2020-improvement in left lung nodules, progressive airspace disease with traction bronchiectasis throughout the right lung, previously noted right lung nodules are obscured, mildly enlarged right paratracheal lymph nodes-not hypermetabolic on prior PET, likely reactive Cycle 7 FOLFOX 01/28/2020 Cycle 8 FOLFOX 02/11/2020 (oxaliplatin held due to neutropenia and thrombocytopenia) Cycle 9 FOLFOX 02/25/2020 (oxaliplatin held due to thrombocytopenia) Cycle 10 FOLFOX 03/11/2020 (oxaliplatin held secondary to neuropathy) Cycle 11 FOLFOX 03/25/2020 (oxaliplatin held due to neuropathy) CT chest 04/05/2020-no new or progressive findings.  Interval evolution of presumed postradiation scarring in the right perihilar lung with decrease in the more diffuse groundglass opacity seen previously.  No substantial change in left lung nodules. 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  No definite CT findings to suggest recurrent tumor.   Stable small left upper lobe pulmonary nodules.  No new or progressive findings.  Stable small right paratracheal lymph nodes.  Stable area of irregular low-attenuation in hepatic segment 4A, site of known prior hepatic metastatic lesion.  No new or progressive findings. Cycle 18 5-fluorouracil 07/06/2020 Cycle 19 5-fluorouracil 07/20/2020 Cycle 20 5-fluorouracil 08/03/2020 Cycle 21 5-fluorouracil  08/17/2020  Cycle 22 5-fluorouracil 08/31/2020 Cycle 23 5-fluorouracil 09/14/2020 CT chest 09/24/2020-increased number and size of pulmonary nodules bilaterally.  Findings in the right upper lobe are concerning for potential lymphangitic spread of tumor.  Right upper lobe findings could also reflect evolving postradiation change. Cycle 1 FOLFIRI 10/12/2020 Cycle 2 FOLFIRI 11/08/2020, irinotecan dose reduced due to neutropenia and thrombocytopenia following cycle 1 Cycle 3 FOLFIRI 12/01/2020 Cycle 4 FOLFIRI 12/22/2020 CT chest 01/11/2021-similar bilateral pulmonary nodules.  Left upper lobe nodule has mildly decreased in size.  No new nodules. Cycle 5 FOLFIRI 01/12/2021 Cycle 6 FOLFIRI 02/03/2021 Cycle 7 FOLFIRI 03/01/2021 Cycle 8 FOLFIRI 03/22/2021 CT chest 04/08/2021-multiple small bilateral lung nodules not significantly changed.  No new nodules.  Unchanged enlarged pretracheal nodes.  Increase in fibrotic consolidation of the perihilar and inferior right lung. Cycle 9 FOLFIRI 04/12/2021 05/04/2021 treatment held due to fatigue Cycle 10 FOLFIRI 05/18/2021 06/07/2021-treatment held secondary to neutropenia Cycle 11 FOLFIRI 06/14/2021 Cycle 12 FOLFIRI 07/05/2021 Cycle 13 FOLFIRI 07/27/2021, irinotecan held 14 FOLFIRI 08/16/2021, irinotecan held CT chest 09/02/2021-enlargement of several lung nodules, no new nodules, stable postradiation changes in the right lower lobe and right middle lobe, gastroesophageal varices identified Cycle one 5-FU/Panitumumab 11/01/2021 Cycle two 5-FU/panitumumab 11/29/2021-Solu-Medrol and Pepcid  prophylaxis added after she developed a cough during the panitumumab with cycle 1 Cycle 3 5-FU/panitumumab 12/12/2021 Cycle four 5-FU/panitumumab 12/27/2021 CT chest 01/25/2022-mild progression of metastatic disease to the lungs with increased size but stable number of metastatic lesions. Cycle 1 Lonsurf 02/27/2022 Cycle 2 Lonsurf 04/10/2022, placed on hold 04/13/2022 due to LFT abnormalities CT abdomen/pelvis 04/13/2022-persistent/chronic intrahepatic biliary dilatation most notable left hepatic lobe; vague area of low-attenuation near the porta hepatis, MRI recommended. MRI liver 04/14/2022-hypoenhancing 2.7 x 1.9 cm central liver mass in the region of the biliary hilum.  Associated high-grade biliary stricture at the biliary hilum affecting the central right and left intrahepatic bile ducts.       2.   Hypothyroid   3.    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  Port-A-Cath placement 09/29/2019, interventional radiology   5.  Neutropenia and thrombocytopenia following cycle 1 FOLFOX-plan chemotherapy dose reductions, she declined G-CSF   6.  Mucositis following cycle 5 FOLFOX, 5-fluorouracil dose reduced with cycle 6 Mucositis following cycle 22 5-fluorouracil-Magic mouthwash added   7.  Right lung airspace disease/volume loss-likely toxicity from chest radiation, trial of prednisone 01/29/2020; dyspnea and cough improved 02/11/2020 Progressive cough 03/11/2020-prednisone resumed at a dose of 20 mg daily Cough and dyspnea improved 03/25/2020-prednisone taper to 15 mg daily Improved 04/06/2020-prednisone taper to 10 mg daily Stable 04/21/2020-prednisone taper to 5 mg daily   8.  Gastroesophageal varices   9.   Admission 04/17/2022 with obstructive jaundice secondary to a central liver mass ERCP 04/18/2022-biliary obstruction secondary to a central liver mass, status post stent placement MRI/MRCP-paraesophageal varices, or a stent extending from the duodenum  to the confluence of the right and left hepatic duct stent does not extend into either the right or left duct, 4 x 1.7 cm central liver mass, suspicion for left portal vein thrombosis, mild pancreatic and peripancreatic edema, esophagus and gastric varices EGD removal of biliary stent 04/21/2022 Placement of right external biliary drain 04/21/2022 Placement of new right external/internal biliary drain 04/26/2022  10.  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  She is admitted with biliary obstruction.  She has undergone placement  of multiple biliary stents.  There is persistent marked elevation of the bilirubin, higher since the new drain was placed on 04/26/2022.    The biliary obstruction appears to be related to tumor at the hilum.  This is most likely related to metastatic rectal cancer, but she could have a cholangiocarcinoma.  The solid lung nodules visible on the March CT abdomen/pelvis appears stable.   She has chronic portal hypertension.  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  I do not recommend anticoagulation therapy.  Mild thrombocytopenia is likely secondary to portal hypertension and recent chemotherapy.   Recommendations: Continue biliary drainage, consider repeat cholangiogram by IR Systemic therapy for rectal cancer will remain on hold until the bilirubin is lower Please call oncology as needed, I will check on her 05/01/2022   LOS: 11 days   Betsy Coder, MD   04/28/2022, 2:01 PM

## 2022-04-28 NOTE — Progress Notes (Signed)
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 Authorizing Provider  albuterol (PROVENTIL HFA) 108 (90 Base) MCG/ACT inhaler Inhale 2 puffs into the lungs every 6 (six) hours as needed for wheezing or shortness of breath. 08/03/20  Yes Owens Shark, NP  bisacodyl (DULCOLAX) 5 MG EC tablet Take 5 mg by mouth daily as needed for mild constipation or moderate constipation.   Yes [provider]  levothyroxine (SYNTHROID) 50 MCG tablet Take 1 tablet by mouth once daily Patient taking differently: Take 50 mcg by mouth daily before breakfast. 02/06/22  Yes Ladell Pier, MD  lidocaine-prilocaine (EMLA) cream Apply 1 Application topically as needed. Apply to port site 1-2 hours prior to use Patient taking differently: Apply 1 Application topically as needed (for port access- one to two hours prior to accessing). 12/12/21  Yes Owens Shark, NP  magic mouthwash SOLN Take 5 mLs by mouth 4 (four) times daily as needed for mouth pain. 5 ml swish and spit 4 times a daily as needed for mouth pain 09/14/20  Yes Ladell Pier, MD  magnesium oxide (MAG-OX) 400 (240 Mg) MG tablet Take 1 tablet (400 mg total) by mouth 2 (two) times daily. 02/22/22  Yes Ladell Pier, MD  Nutritional Supplements (ENSURE ORIGINAL) LIQD Take 237 mLs by mouth See admin instructions. Drink 237 ml's (1 CHOCOLATE shake) by mouth once a day   Yes [provider]  potassium chloride (MICRO-K) 10 MEQ CR capsule Take 10 mEq by mouth daily.   Yes [provider]  predniSONE (DELTASONE) 5 MG tablet Take 1 tablet by mouth once daily with breakfast 03/15/22  Yes Ladell Pier, MD  prochlorperazine (COMPAZINE) 10 MG tablet Take 1 tablet (10 mg total) by mouth every 6 (six) hours as needed for nausea or vomiting. 03/01/21  Yes Owens Shark, NP  SYSTANE ULTRA PF 0.4-0.3 % SOLN Place 1 drop into both eyes 3 (three) times daily as needed (for dryness).   Yes [provider]  nystatin (MYCOSTATIN/NYSTOP) powder Apply 1 Application topically 3 (three) times daily. Patient not taking: Reported on 04/17/2022 12/12/21   Owens Shark, NP  Spacer/Aero-Holding Chambers (AEROCHAMBER MV) inhaler Use as instructed 12/31/19   Ladell Pier, MD  trifluridine-tipiracil (LONSURF) 20-8.19 MG tablet Take 2 tablets (40 mg Trifluridine) twice daily, take within 1 hr after AM & PM meals on days 1-5, 8-12. Repeat every 28 days. Start cycle on 04/10/22 Patient not taking: Reported on 04/17/2022 04/04/22   Ladell Pier, MD     Vital Signs: BP (!) 133/56 (BP Location: Right Arm)   Pulse 87   Temp 98.3 F (36.8 C) (Oral)   Resp 18   Ht 5\' 2"  (1.575 m)   Wt 217 lb 13 oz (98.8 kg)   LMP  (LMP Unknown)   SpO2 100%   BMI 39.84 kg/m   Physical Exam awake/alert; remains jaundiced with scleral icterus; rt biliary drain intact, insertion site ok, not sig tender, OP 155 cc green bile, drain flushes ok  Imaging: ECHOCARDIOGRAM COMPLETE  Result Date: 04/27/2022  ECHOCARDIOGRAM REPORT   Patient Name:   Samantha Lutz Date of Exam: 04/27/2022 Medical Rec #:  [id number]        Height:       62.0 in Accession #:    [id number]       Weight:       215.8 lb Date of Birth:  [birth year]       BSA:          1.975 m Patient Age:    72 years         BP:           151/65 mmHg Patient Gender: F                HR:           74 bpm. Exam Location:  Inpatient Procedure: 2D Echo, 3D Echo, Cardiac Doppler, Color Doppler and Strain Analysis Indications:    R00.8 Other  abnormalities of heart beat  History:        Patient has no prior history of Echocardiogram examinations.                 Risk Factors:Hypertension. Metastatic cancer. Chemo.  Sonographer:    Roseanna Rainbow RDCS Referring Phys: [phone] A CALDWELL POWELL JR  Sonographer Comments: Technically difficult study due to poor echo windows, patient is obese, suboptimal subcostal window and suboptimal parasternal window. Image acquisition challenging due to patient body habitus. IMPRESSIONS  1. Left ventricular ejection fraction, by estimation, is 70 to 75%. The left ventricle has hyperdynamic function. The left ventricle has no regional wall motion abnormalities. Left ventricular diastolic parameters were normal.  2. Right ventricular systolic function is normal. The right ventricular size is normal. Tricuspid regurgitation signal is inadequate for assessing PA pressure.  3. The mitral valve is normal in structure. Trivial mitral valve regurgitation. No evidence of mitral stenosis.  4. The aortic valve was not well visualized. Aortic valve regurgitation is not visualized. No aortic stenosis is present.  5. The inferior vena cava is normal in size with greater than 50% respiratory variability, suggesting right atrial pressure of 3 mmHg. FINDINGS  Left Ventricle: Left ventricular ejection fraction, by estimation, is 70 to 75%. The left ventricle has hyperdynamic function. The left ventricle has no regional wall motion abnormalities. 3D ejection fraction reviewed and evaluated as part of the interpretation. Alternate measurement of EF is felt to be most reflective of LV function. The left ventricular internal cavity size was small. There is no left ventricular hypertrophy. Left ventricular diastolic parameters were normal. Right Ventricle: The right ventricular size is normal. No increase in right ventricular wall thickness. Right ventricular systolic function is normal. Tricuspid regurgitation signal is inadequate for assessing PA  pressure. Left Atrium: Left atrial size was normal in size. Right Atrium: Right atrial size was normal in size. Pericardium: There is no evidence of pericardial effusion. Mitral Valve: The mitral valve is normal in structure. Trivial mitral valve regurgitation. No evidence of mitral valve stenosis. Tricuspid Valve: The tricuspid valve is normal in structure. Tricuspid valve regurgitation is trivial. No evidence of tricuspid stenosis. Aortic Valve: The aortic valve was not well visualized. Aortic valve regurgitation is not visualized. No aortic stenosis is present. Pulmonic Valve: The pulmonic valve was normal in structure. Pulmonic valve regurgitation is not visualized. No evidence of pulmonic stenosis. Aorta: Linear echodensity in the ascending aorta most likely represents reverberation artifact. The aortic root is normal in size and structure. Venous: The inferior vena cava  is normal in size with greater than 50% respiratory variability, suggesting right atrial pressure of 3 mmHg. IAS/Shunts: The atrial septum is grossly normal.  LEFT VENTRICLE PLAX 2D LVIDd:         4.00 cm     Diastology LVIDs:         2.20 cm     LV e' medial:    7.51 cm/s LV PW:         1.00 cm     LV E/e' medial:  16.5 LV IVS:        0.90 cm     LV e' lateral:   7.83 cm/s LVOT diam:     2.10 cm     LV E/e' lateral: 15.8 LV SV:         94 LV SV Index:   47 LVOT Area:     3.46 cm  LV Volumes (MOD) LV vol d, MOD A2C: 58.3 ml LV vol d, MOD A4C: 56.3 ml LV vol s, MOD A2C: 12.4 ml LV vol s, MOD A4C: 10.4 ml LV SV MOD A2C:     45.9 ml LV SV MOD A4C:     56.3 ml LV SV MOD BP:      44.9 ml RIGHT VENTRICLE RV S prime:     13.10 cm/s TAPSE (M-mode): 2.0 cm LEFT ATRIUM             Index        RIGHT ATRIUM          Index LA diam:        3.40 cm 1.72 cm/m   RA Area:     8.52 cm LA Vol (A2C):   34.1 ml 17.27 ml/m  RA Volume:   14.00 ml 7.09 ml/m LA Vol (A4C):   31.2 ml 15.80 ml/m LA Biplane Vol: 33.5 ml 16.97 ml/m  AORTIC VALVE LVOT Vmax:   118.00  cm/s LVOT Vmean:  79.500 cm/s LVOT VTI:    0.270 m  AORTA Ao Root diam: 2.80 cm Ao Asc diam:  3.30 cm MITRAL VALVE MV Area (PHT): 3.42 cm     SHUNTS MV Decel Time: 222 msec     Systemic VTI:  0.27 m MV E velocity: 124.00 cm/s  Systemic Diam: 2.10 cm MV A velocity: 128.00 cm/s MV E/A ratio:  0.97 Cherlynn Kaiser MD Electronically signed by Cherlynn Kaiser MD Signature Date/Time: 04/27/2022/2:32:06 PM    Final    IR CONVERT BILIARY DRAIN TO INT EXT BILIARY DRAIN  Result Date: 04/26/2022 INDICATION: History of metastatic colon cancer, now with malignant obstruction at the level of the biliary hilum. Patient underwent placement of an external biliary drainage catheter on 04/21/2022 however initial access was very challenging secondary to a combination lack of significant dilatation of the right intrahepatic biliary system as well as patient body habitus. Ultimately, a duct within the posterior aspect of the right lobe of the liver was cannulated however would not allow for placement of an internal/external drainage catheter secondary to angulation of the access site, confirmed on subsequent abdominal CT performed 04/25/2022. As such, patient presents today for attempted placement of a new right-sided biliary drainage catheter as well as potential placement of a left-sided biliary drainage catheter. EXAM: 1. FLUOROSCOPIC GUIDED PERCUTANEOUS TRANSHEPATIC CHOLANGIOGRAM AND BILIARY TUBE PLACEMENT VIA RIGHT-SIDED BILIARY APPROACH 2. ATTEMPTED THOUGH ULTIMATELY UNSUCCESSFUL TRAVERSAL INTO THE ATROPHIC LEFT INTRAHEPATIC BILIARY SYSTEM. COMPARISON:  CT abdomen and pelvis-04/25/2022; 04/13/2022; fluoroscopic images during right-sided external biliary drainage catheter placement-04/21/2022  MEDICATIONS: Ceftriaxone 2 g; the antibiotic was administered with an appropriate time frame prior to the initiation of the procedure CONTRAST:  35mL OMNIPAQUE IOHEXOL 300 MG/ML SOLN - administered into the biliary tree.  ANESTHESIA/SEDATION: General anesthesia FLUOROSCOPY TIME:  [id number] mGy COMPLICATIONS: None immediate. TECHNIQUE: Informed written consent was obtained from the patient after a discussion of the risks, benefits and alternatives to treatment. Questions regarding the procedure were encouraged and answered. A timeout was performed prior to the initiation of the procedure. Patient was placed under general anesthesia via the anesthesia department Next, with the patient positioned slightly left lateral decubitus, the right upper abdominal quadrant was prepped and draped in the usual sterile fashion, and a sterile drape was applied covering the operative field. Maximum barrier sterile technique with sterile gowns and gloves were used for the procedure. A timeout was performed prior to the initiation of the procedure. The pre-existing suboptimally positioned right-sided external biliary drainage catheter was injected with a small amount of contrast demonstrating appropriate positioning with end coiled and locked within the central aspect of the right biliary tree with minimal opacification of the common bile duct. There is no definitive opacification of the left intrahepatic biliary system. Next, under direct fluoroscopic guidance, the access site of the suboptimally positioned biliary drainage catheter was targeted with a Hague needle. Appropriate access to the biliary system was confirmed with a reflux of a small amount of bile as well as advancement of the Nitrex wire along the pre-existing drainage catheter to the level of the biliary hilum. The track was dilated with an Accustick set. A stiff glidewire and a 4 French angled glide catheter were advanced through the outer sheath of the Accustick set to the level of the horizontal segment of the duodenum. Contrast injection confirmed appropriate positioning. Next, the pre-existing biliary drainage catheter was removed intact. At this time, the 4 French angled glide  catheter was exchanged for a 7 [place], 35 cm radiopaque tip vascular sheath which was advanced to the level of the biliary hilum. Contrast injection demonstrated faint opacification of the central aspect of the left intrahepatic biliary tree. Despite prolonged efforts, the obstructing lesion at the level of the biliary hilum could not be traversed. Sonographic evaluation was performed of the abdomen and demonstrated a markedly atrophic left lobe of the liver, poorly visualized sonographically and noted to be immediately adjacent to the cardiac apex. There is consideration for direct sticking of the central aspect of the left lobe of the liver however this ultimately was deferred secondary to proximity of the cardiac apex. At this time, the vascular sheath was exchanged for a 10 [place] biliary drainage catheter with end coiled and locked within the duodenum with radiopaque side marker located at the level of the biliary hilum. Contrast injection was performed and the procedure was terminated The biliary drainage catheter was secured at the skin entrance site within interrupted suture, flushed with a small amount of saline and connected to a gravity bag. The patient was extubated and tolerated the procedure well without immediate postprocedural complication. FINDINGS: Contrast injection of pre-existing external biliary drainage catheter demonstrates appropriate positioning with end coiled and locked within the central aspect of the right biliary tree. There is minimal opacification of the common bile duct without definitive opacification of the left intrahepatic biliary tree. Under fluoroscopic guidance, the access site of the pre-existing biliary drainage catheter was cannulated at a more favorable angle ultimately allowing placement of a internal/external 10 [place] biliary drainage catheter with  end coiled within the horizontal segment of the duodenum and radiopaque side marker at the level of the biliary hilum As  above, there is faint opacification of the central aspect of the atrophic left intrahepatic biliary tree however this could not be successfully traversed percutaneously from the right-sided approach There was consideration for accessing the atrophic left lobe with ultrasound and/or fluoroscopic guidance however this ultimately was deferred secondary to pre-existing marked atrophy of the left lobe as well proximity of the cardiac apex. IMPRESSION: 1. Successful percutaneous placement of a new internal/external 10 [place] biliary drainage catheter via a more favorable approach with end coiled within the duodenum and radiopaque side marker at the level of the central aspect of the right intrahepatic biliary tree. 2. Faint opacification of the central most aspect of the atrophic left intrahepatic biliary system. The left intrahepatic biliary system was unable to be traversed percutaneously from the right-sided approach secondary to malignant obstruction of the level of the hilum. Consideration for either sonographic or fluoroscopic puncture of the atrophic left intrahepatic biliary tree was deferred secondary to marked atrophy of the left lobe of the liver and proximity of the cardiac apex. PLAN: Recommend maintaining the new internal/external biliary drainage catheter to external drainage realizing the output will be variable as some bile may be drain internally. Once the patient's bilirubin reaches a steady nadir, consideration for initiating a capping trial may be performed as indicated. Note, given central location of the biliary obstruction, the patient is likely NOT a candidate for internal biliary stent placement. Electronically Signed   By: Sandi Mariscal M.D.   On: 04/26/2022 18:03   DG CHEST PORT 1 VIEW  Result Date: 04/25/2022 CLINICAL DATA:  Chest pain and shortness of breath EXAM: PORTABLE CHEST 1 VIEW COMPARISON:  12/31/2019, CT from earlier in the same day. FINDINGS: Cardiac shadow is stable. Right chest  wall port is again noted. Left lung is clear with the exception of nodules in the upper lobe similar to that seen on previous exams. Right-sided pleural effusion with underlying consolidation is noted similar to that seen on CT from earlier in the same day. No other focal abnormality is noted. IMPRESSION: Persistent right-sided effusion and underlying consolidation. Stable left upper lobe pulmonary nodules. Electronically Signed   By: Inez Catalina M.D.   On: 04/25/2022 19:18   CT ABDOMEN W CONTRAST  Result Date: 04/25/2022 CLINICAL DATA:  Metastatic colon cancer, biliary obstruction suspected, status post stent placement, pain around drainage site * Tracking Code: BO * EXAM: CT ABDOMEN WITH CONTRAST TECHNIQUE: Multidetector CT imaging of the abdomen was performed using the standard protocol following bolus administration of intravenous contrast. RADIATION DOSE REDUCTION: This exam was performed according to the departmental dose-optimization program which includes automated exposure control, adjustment of the mA and/or kV according to patient size and/or use of iterative reconstruction technique. CONTRAST:  129mL OMNIPAQUE IOHEXOL 300 MG/ML  SOLN COMPARISON:  CT abdomen pelvis, 04/13/2022 FINDINGS: Lower chest: Moderate right pleural effusion associated atelectasis or consolidation, increased compared to prior examination. Underlying mass of the dependent right lung base, similar in appearance. Hepatobiliary: Interval placement of a right-sided percutaneous biliary drain, with a formed pigtail in the central right hepatic ducts (series 2, image 27). No fluid collection or other abnormality associated with the drain tract. Diminished right-sided biliary ductal dilatation on today's examination. Similar appearance of left-sided biliary ductal dilatation (series 2, image 23). No directly visualized obstipated metastasis however there is ill-defined hypodensity in the central liver (series 2,  image 23). Numerous  small gallstones and excreted contrast in the gallbladder. Pancreas: New inflammatory fat stranding about the pancreatic head and uncinate (series 2, image 41). No acute pancreatic fluid collection. Spleen: Unchanged splenomegaly, maximum span 14.4 cm. Adrenals/Urinary Tract: Adrenal glands are unremarkable. Kidneys are normal, without renal calculi, solid lesion, or hydronephrosis. Stomach/Bowel: Stomach is within normal limits. No evidence of bowel wall thickening, distention, or inflammatory changes. Vascular/Lymphatic: Aortic atherosclerosis. Splenic and gastroesophageal varices (series 2, image 21). No enlarged abdominal lymph nodes. Other: No abdominal wall hernia or abnormality. New small volume perihepatic and perisplenic ascites. Musculoskeletal: No acute or significant osseous findings. IMPRESSION: 1. Interval placement of a right-sided percutaneous biliary drain, with a formed pigtail in the central right hepatic ducts. No fluid collection or other abnormality associated with the drain tract. 2. Diminished right-sided biliary ductal dilatation on today's examination. Similar appearance of left-sided biliary ductal dilatation. No directly visualized obstipating metastasis however there is ill-defined hypodensity in the central liver keeping with a known metastasis. 3. New inflammatory fat stranding about the pancreatic head and uncinate, consistent with acute pancreatitis. No acute pancreatic fluid collection. 4. New small volume perihepatic and perisplenic ascites. 5. Moderate right pleural effusion associated atelectasis or consolidation, increased compared to prior examination. Underlying mass of the dependent right lung base, similar in appearance. 6. Cholelithiasis. 7. Unchanged splenomegaly. 8. Splenic and gastroesophageal varices. Aortic Atherosclerosis (ICD10-I70.0). Electronically Signed   By: Delanna Ahmadi M.D.   On: 04/25/2022 13:37    Labs:  CBC: Recent Labs    04/24/22 0500  04/24/22 1854 04/25/22 0333 04/27/22 0500 04/28/22 0500  WBC 3.8*  --  3.8* 6.9 5.2  HGB 8.1* 8.3* 8.1* 8.9* 8.9*  HCT 24.5* 24.9* 24.2* 27.0* 26.6*  PLT 69*  --  77* 115* 114*    COAGS: Recent Labs    04/17/22 0841 04/24/22 0500  INR 1.1 1.2    BMP: Recent Labs    04/24/22 0500 04/25/22 0333 04/27/22 0500 04/28/22 0500  NA 134* 135 136 135  K 3.4* 3.5 3.7 4.1  CL 100 105 105 101  CO2 24 23 23 26   GLUCOSE 88 110* 105* 100*  BUN 20 16 14 11   CALCIUM 7.5* 7.7* 7.4* 8.1*  CREATININE 0.62 0.61 0.51 0.56  GFRNONAA >60 >60 >60 >60    LIVER FUNCTION TESTS: Recent Labs    04/24/22 0500 04/25/22 0812 04/27/22 0500 04/28/22 0500  BILITOT 12.8* 9.4* 12.5* 17.3*  AST 63* 52* 66* 76*  ALT 59* 47* 45* 53*  ALKPHOS 156* 148* 193* 218*  PROT 4.9* 4.8* 4.7* 5.4*  ALBUMIN 2.0* 2.0* 1.9* 2.3*    Assessment and Plan: 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, biliary obstruction; s/p placement of right ext biliary drain 04/21/22 with subsequent removal and placement of new I/E rt biliary drain 04/26/22; the left intrahepatic biliary system was unable to be traversed percutaneously from the right-sided approach secondary to malignant obstruction of the level of the hilum; afebrile, WBC nl, hgb 8.9, creat nl, t bili 17.3(12.5), bile cx pend; as per Dr. Pascal Lux note ,   Recommend maintaining the new internal/external biliary drainage catheter to external drainage realizing the output will be variable as some bile may be drain internally.   Once the patient's bilirubin reaches a steady nadir, consideration for initiating a capping trial may be performed as indicated.   Note, given central location of the biliary obstruction, the patient is likely NOT a candidate for internal biliary stent placement.  Cont drain flushes every 8 hrs, dressing changes every 2-3 days, lab checks; above d/w Dr. Anselm Pancoast; if t bili cont to increase will set pt up for f/u cholangiogram   Electronically  Signed: D. Rowe Robert, PA-C 04/28/2022, 10:06 AM   I spent a total of 15 Minutes at the the patient's bedside AND on the patient's hospital floor or unit, greater than 50% of which was counseling/coordinating care for biliary drain    Patient ID: Samantha Lutz, female   DOB: 14-Jan-1951, 72 y.o.   MRN: [id number]

## 2022-04-28 NOTE — Progress Notes (Signed)
Progress Note    Samantha Lutz   [id number]  DOB: 1950/07/12  DOA: 04/17/2022     11 PCP: Andre Lefort, FNP  Initial CC: elevated total [facility] Course: Samantha Lutz is a 72 yo female with PMH rectal cancer with liver metastasis who presented with mild abdominal discomfort and outpatient elevated total bilirubin.  This has been trended and found to not be resolving.  MRI liver was obtained on 04/14/2022 which showed underlying hypoenhancing central liver mass measuring 2.7 x 1.9 cm compatible with solitary recurrent liver metastasis.  Also showed underlying associated high-grade malignant biliary hilar stricture.  She was admitted for GI evaluation and expedited endoscopy for further evaluation.  She underwent ERCP and stent placement 3/19. Hospitalization complicated by post ERCP pancreatitis and rising LFT's. GI removed biliary stent on 3/22 and IR was consulted for external biliary drain. She underwent fluoro guided right sided transhepatic biliary drainage catheter with IR on 04/26/2022.  Interval History:  No events overnight.  Still having some epigastric pain although states it got better with Maalox and Protonix yesterday.  Otherwise also states that she continues to have lingering right flank and left upper quadrant pains. Urine started turning dark yellow again as expected with her uptrending total bili.  No nausea/vomiting and has been eating moderately okay (doesn't like the food).   Assessment and Plan: * Biliary obstruction - Underwent MRI liver on 04/14/2022 showing high-grade malignant biliary hilar stricture - s/p ERCP 3/19 with localized biliary strictures found in the hepatitic duct system from extrahepatic mestastasis.  Strictures malignant appearing.  Treated with stent placement.  - s/p unasyn - MRCP with biliary stent extending from duodenum to vicinity of central mass near the confluence of the R and L hepatic ducts - mild right and L intrahepatic  biliary dilatation indicates that the stent may possibly not be transversing the mass or maybe only partially transversing the mass - L hepatic lobe biliary dilatation is mildly improved but not resolved.   - removal of biliary stent 3/22 - s/p external biliary drain in right hepatic bile duct 3/22, difficult placement, bleeding when drain/catheter not in place (due to intercostal vessel), treated with liod with epi and pursestring suture - no bleeding with drain in place.  Per Rads, concern that hyperbili not due to biliary obstruction as CBD patent to level of duodenum, consider internalization if bilirubin improves with external drainage. - CT abd with contrast 3/26 with R sided perc biliary drain with formed pigtail in central right hepatic ducts.  Diminished R sided biliary ductal dilatation.  Similar appearance of L sided biliary ductal dilatation.  Ill defined hypodensity in central liver.   - GI now signed off - s/p fluoroscopy guided right sided approach biliary drainage catheter placed in IR on 04/26/2022; removal of initial biliary drain -Continue flushing drain 3 times daily  - TB has been uptrending along with remainer LFTs suggesting obstruction still. Radiology still following; potential need for repeat cholangiogram if labs continue to worsen   Rectal cancer [id number] (1/23 LN) s/p neoadj chemoXRT, robotic LAR resection & diverting loop ileostomy 12/21/2017 - Follows closely outpatient with oncology; has been needed with Lonsurf; cycle 2 placed on hold recently due to hyperTB  Hypothyroid - Continue Synthroid  Hypertension - relatively controlled -No home meds noted on med rec  Post-ERCP acute pancreatitis-resolved as of 04/27/2022 -CT with persistent pancreatitis, symptoms have improved   Volume Overload  Perihepatic and Perisplenic Ascites  Right Sided Pleural Effusion  Follow CXR and echo  Will start some lasix  Strict I/O, daily weights - up 14 lbs since admission, net  positive 10 L    Anemia - downtrending, will follow - relatively stable today (notably difficult placement of external biliary drain, bleeding noted) - will continue to trend   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 Vein Thrombosis - no need for anticoagulation per discussion with oncology   Leukocytosis  Fever - due to above, s/p abx - resolved   Hyponatremia - improved, hold further LR   T8 Compression Fracture - noted, follow   Old records reviewed in assessment of this patient  Antimicrobials: Unasyn 04/18/22 >> 04/25/22  DVT prophylaxis:  enoxaparin (LOVENOX) injection 40 mg Start: 04/27/22 1000 SCDs Start: 04/17/22 1934   Code Status:   Code Status: Full Code  Mobility Assessment (last 72 hours)     Mobility Assessment     Row Name 04/27/22 0942 04/25/22 2015         Does patient have an order for bedrest or is patient medically unstable No - Continue assessment No - Continue assessment      What is the highest level of mobility based on the progressive mobility assessment? Level 6 (Walks independently in room and hall) - Balance while walking in room without assist - Complete Level 6 (Walks independently in room and hall) - Balance while walking in room without assist - Complete               Barriers to discharge:  Disposition Plan:  Home Status is: Inpt  Objective: Blood pressure 135/62, pulse 88, temperature 98.3 F (36.8 C), temperature source Oral, resp. rate 17, height 5\' 2"  (1.575 m), weight 98.8 kg, SpO2 96 %.  Examination:  Physical Exam Constitutional:      Appearance: Normal appearance.  HENT:     Head: Normocephalic and atraumatic.     Mouth/Throat:     Mouth: Mucous membranes are moist.  Eyes:     Extraocular Movements: Extraocular movements intact.  Cardiovascular:     Rate and Rhythm: Normal rate and regular rhythm.  Pulmonary:     Effort: Pulmonary effort is normal.     Breath sounds: Normal breath sounds.  Abdominal:     General: Bowel sounds  are normal. There is no distension.     Palpations: Abdomen is soft.     Tenderness: There is no abdominal tenderness.     Comments: Right side noted with biliary drain in place  Musculoskeletal:        General: Normal range of motion.     Cervical back: Normal range of motion and neck supple.  Skin:    General: Skin is warm and dry.  Neurological:     General: No focal deficit present.     Mental Status: She is alert.  Psychiatric:        Mood and Affect: Mood normal.        Behavior: Behavior normal.      Consultants:  GI IR  Procedures:  04/26/22: Successful fluoro guided placement of a right sided approach transhepatic 10 Fr biliary drainage catheter with end coiled and locked within the duodenum. Biliary drain connected to gravity bag.  Data Reviewed: Results for orders placed or performed during the hospital encounter of 04/17/22 (from the past 24 hour(s))  Body fluid culture w Gram Stain     Status: None (Preliminary result)   Collection Time: 04/27/22  3:28 PM   Specimen: BILE; Body Fluid  Result Value Ref Range   Specimen Description      BILE Performed at [facility] [address], [place], [place] [address]    Special Requests      Normal Performed at [facility], [place] [address] [place], [place] [address]    Gram Stain      NO WBC SEEN ABUNDANT GRAM NEGATIVE RODS FEW GRAM POSITIVE COCCI IN CHAINS RARE BUDDING YEAST SEEN    Culture      ABUNDANT GRAM NEGATIVE RODS SUSCEPTIBILITIES TO FOLLOW Performed at [facility], [place] [address] [place], [place] [address]    Report Status PENDING   CBC with Differential/Platelet     Status: Abnormal   Collection Time: 04/28/22  5:00 AM  Result Value Ref Range   WBC 5.2 4.0 - 10.5 K/uL   RBC 2.53 (L) 3.87 - 5.11 MIL/uL   Hemoglobin 8.9 (L) 12.0 - 15.0 g/dL   HCT 26.6 (L) 36.0 - 46.0 %   MCV 105.1 (H) 80.0 - 100.0 fL   MCH 35.2 (H) 26.0 - 34.0 pg   MCHC 33.5 30.0 -  36.0 g/dL   RDW 19.1 (H) 11.5 - 15.5 %   Platelets 114 (L) 150 - 400 K/uL   nRBC 0.0 0.0 - 0.2 %   Neutrophils Relative % 76 %   Neutro Abs 3.9 1.7 - 7.7 K/uL   Lymphocytes Relative 7 %   Lymphs Abs 0.4 (L) 0.7 - 4.0 K/uL   Monocytes Relative 14 %   Monocytes Absolute 0.7 0.1 - 1.0 K/uL   Eosinophils Relative 1 %   Eosinophils Absolute 0.0 0.0 - 0.5 K/uL   Basophils Relative 0 %   Basophils Absolute 0.0 0.0 - 0.1 K/uL   Immature Granulocytes 2 %   Abs Immature Granulocytes 0.08 (H) 0.00 - 0.07 K/uL  Comprehensive metabolic panel     Status: Abnormal   Collection Time: 04/28/22  5:00 AM  Result Value Ref Range   Sodium 135 135 - 145 mmol/L   Potassium 4.1 3.5 - 5.1 mmol/L   Chloride 101 98 - 111 mmol/L   CO2 26 22 - 32 mmol/L   Glucose, Bld 100 (H) 70 - 99 mg/dL   BUN 11 8 - 23 mg/dL   Creatinine, Ser 0.56 0.44 - 1.00 mg/dL   Calcium 8.1 (L) 8.9 - 10.3 mg/dL   Total Protein 5.4 (L) 6.5 - 8.1 g/dL   Albumin 2.3 (L) 3.5 - 5.0 g/dL   AST 76 (H) 15 - 41 U/L   ALT 53 (H) 0 - 44 U/L   Alkaline Phosphatase 218 (H) 38 - 126 U/L   Total Bilirubin 17.3 (H) 0.3 - 1.2 mg/dL   GFR, Estimated >60 >60 mL/min   Anion gap 8 5 - 15  Magnesium     Status: None   Collection Time: 04/28/22  5:00 AM  Result Value Ref Range   Magnesium 2.0 1.7 - 2.4 mg/dL     I have reviewed pertinent nursing notes, vitals, labs, and images as necessary. I have ordered labwork to follow up on as indicated.  I have reviewed the last notes from staff over past 24 hours. I have discussed patient's care plan and test results with nursing staff, CM/SW, and other staff as appropriate.  Time spent: Greater than 50% of the 55 minute visit was spent in counseling/coordination of care for the patient as laid out in the A&P.   LOS: 11 days  Dwyane Dee, MD [facility] 04/28/2022, 1:18 PM

## 2022-04-29 DIAGNOSIS — C20 Malignant neoplasm of rectum: Secondary | ICD-10-CM | POA: Diagnosis not present

## 2022-04-29 DIAGNOSIS — K831 Obstruction of bile duct: Secondary | ICD-10-CM | POA: Diagnosis not present

## 2022-04-29 LAB — CBC WITH DIFFERENTIAL/PLATELET
Abs Immature Granulocytes: 0.1 K/uL — ABNORMAL HIGH (ref 0.00–0.07)
Basophils Absolute: 0 K/uL (ref 0.0–0.1)
Basophils Relative: 0 %
Eosinophils Absolute: 0 K/uL (ref 0.0–0.5)
Eosinophils Relative: 0 %
HCT: 27.6 % — ABNORMAL LOW (ref 36.0–46.0)
Hemoglobin: 8.9 g/dL — ABNORMAL LOW (ref 12.0–15.0)
Immature Granulocytes: 1 %
Lymphocytes Relative: 4 %
Lymphs Abs: 0.3 K/uL — ABNORMAL LOW (ref 0.7–4.0)
MCH: 34.2 pg — ABNORMAL HIGH (ref 26.0–34.0)
MCHC: 32.2 g/dL (ref 30.0–36.0)
MCV: 106.2 fL — ABNORMAL HIGH (ref 80.0–100.0)
Monocytes Absolute: 0.7 K/uL (ref 0.1–1.0)
Monocytes Relative: 9 %
Neutro Abs: 6.9 K/uL (ref 1.7–7.7)
Neutrophils Relative %: 86 %
Platelets: 116 K/uL — ABNORMAL LOW (ref 150–400)
RBC: 2.6 MIL/uL — ABNORMAL LOW (ref 3.87–5.11)
RDW: 19.3 % — ABNORMAL HIGH (ref 11.5–15.5)
WBC: 8.1 K/uL (ref 4.0–10.5)
nRBC: 0 % (ref 0.0–0.2)

## 2022-04-29 LAB — COMPREHENSIVE METABOLIC PANEL WITH GFR
ALT: 53 U/L — ABNORMAL HIGH (ref 0–44)
AST: 74 U/L — ABNORMAL HIGH (ref 15–41)
Albumin: 2.2 g/dL — ABNORMAL LOW (ref 3.5–5.0)
Alkaline Phosphatase: 207 U/L — ABNORMAL HIGH (ref 38–126)
Anion gap: 9 (ref 5–15)
BUN: 14 mg/dL (ref 8–23)
CO2: 25 mmol/L (ref 22–32)
Calcium: 8.1 mg/dL — ABNORMAL LOW (ref 8.9–10.3)
Chloride: 99 mmol/L (ref 98–111)
Creatinine, Ser: 0.55 mg/dL (ref 0.44–1.00)
GFR, Estimated: >60 mL/min
Glucose, Bld: 112 mg/dL — ABNORMAL HIGH (ref 70–99)
Potassium: 4 mmol/L (ref 3.5–5.1)
Sodium: 133 mmol/L — ABNORMAL LOW (ref 135–145)
Total Bilirubin: 18.2 mg/dL (ref 0.3–1.2)
Total Protein: 5.6 g/dL — ABNORMAL LOW (ref 6.5–8.1)

## 2022-04-29 LAB — BODY FLUID CULTURE W GRAM STAIN
Gram Stain: NONE SEEN
Special Requests: NORMAL

## 2022-04-29 LAB — MAGNESIUM: Magnesium: 2 mg/dL (ref 1.7–2.4)

## 2022-04-29 MED ORDER — METHOCARBAMOL 1000 MG/10ML IJ SOLN
500.0000 mg | Freq: Three times a day (TID) | INTRAVENOUS | Status: AC
  Administered 2022-04-29 – 2022-05-01 (×6): 500 mg via INTRAVENOUS
  Filled 2022-04-29 (×6): qty 500

## 2022-04-29 MED ORDER — CYCLOBENZAPRINE HCL 5 MG PO TABS
5.0000 mg | ORAL_TABLET | Freq: Three times a day (TID) | ORAL | Status: DC | PRN
  Administered 2022-04-29 – 2022-05-01 (×3): 5 mg via ORAL
  Filled 2022-04-29 (×3): qty 1

## 2022-04-29 NOTE — Progress Notes (Signed)
Progress Note    Samantha Lutz   [id number]  DOB: 02-29-1950  DOA: 04/17/2022     12 PCP: Andre Lefort, FNP  Initial CC: elevated total [facility] Course: Ms. Palmertree is a 72 yo female with PMH rectal cancer with liver metastasis who presented with mild abdominal discomfort and outpatient elevated total bilirubin.  This has been trended and found to not be resolving.  MRI liver was obtained on 04/14/2022 which showed underlying hypoenhancing central liver mass measuring 2.7 x 1.9 cm compatible with solitary recurrent liver metastasis.  Also showed underlying associated high-grade malignant biliary hilar stricture.  She was admitted for GI evaluation and expedited endoscopy for further evaluation.  She underwent ERCP and stent placement 3/19. Hospitalization complicated by post ERCP pancreatitis and rising LFT's. GI removed biliary stent on 3/22 and IR was consulted for external biliary drain. She underwent fluoro guided right sided transhepatic biliary drainage catheter with IR on 04/26/2022.  Interval History:  Worsening abdominal pain overnight notably on the right side.  Almost felt like something "popped".  Biliary drain in place with sutures in place still.  With more description, she is also describing what sounds like muscle cramping/spasms.  Robaxin and Flexeril started today to see if helps any. No nausea or vomiting and tolerating diet fairly well still.  Assessment and Plan: * Biliary obstruction - Underwent MRI liver on 04/14/2022 showing high-grade malignant biliary hilar stricture - s/p ERCP 3/19 with localized biliary strictures found in the hepatitic duct system from extrahepatic mestastasis.  Strictures malignant appearing.  Treated with stent placement.  - s/p unasyn - MRCP with biliary stent extending from duodenum to vicinity of central mass near the confluence of the R and L hepatic ducts - mild right and L intrahepatic biliary dilatation indicates that  the stent may possibly not be transversing the mass or maybe only partially transversing the mass - L hepatic lobe biliary dilatation is mildly improved but not resolved.   - removal of biliary stent 3/22 - s/p external biliary drain in right hepatic bile duct 3/22, difficult placement, bleeding when drain/catheter not in place (due to intercostal vessel), treated with liod with epi and pursestring suture - no bleeding with drain in place.  Per Rads, concern that hyperbili not due to biliary obstruction as CBD patent to level of duodenum, consider internalization if bilirubin improves with external drainage. - CT abd with contrast 3/26 with R sided perc biliary drain with formed pigtail in central right hepatic ducts.  Diminished R sided biliary ductal dilatation.  Similar appearance of L sided biliary ductal dilatation.  Ill defined hypodensity in central liver.   - GI now signed off - s/p fluoroscopy guided right sided approach biliary drainage catheter placed in IR on 04/26/2022; removal of initial biliary drain -Continue flushing drain 3 times daily  - TB has been uptrending along with remainer LFTs suggesting obstruction still. Radiology still following; potential need for repeat cholangiogram if labs continue to worsen   Rectal cancer ypT3ypN1a (1/23 LN) s/p neoadj chemoXRT, robotic LAR resection & diverting loop ileostomy 12/21/2017 - Follows closely outpatient with oncology; has been needed with Lonsurf; cycle 2 placed on hold recently due to hyperTB  Hypothyroid - Continue Synthroid  Hypertension - relatively controlled -No home meds noted on med rec  Post-ERCP acute pancreatitis-resolved as of 04/27/2022 -CT with persistent pancreatitis, symptoms have improved   Volume Overload  Perihepatic and Perisplenic Ascites  Right Sided Pleural Effusion Follow CXR and echo  Will start some lasix  Strict I/O, daily weights - up 14 lbs since admission, net positive 10 L    Anemia -  downtrending, will follow - relatively stable today (notably difficult placement of external biliary drain, bleeding noted) - will continue to trend   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 Vein Thrombosis - no need for anticoagulation per discussion with oncology   Leukocytosis  Fever - due to above, s/p abx - resolved   Hyponatremia - improved, hold further LR   T8 Compression Fracture - noted, follow   Old records reviewed in assessment of this patient  Antimicrobials: Unasyn 04/18/22 >> 04/25/22  DVT prophylaxis:  enoxaparin (LOVENOX) injection 40 mg Start: 04/27/22 1000 SCDs Start: 04/17/22 1934   Code Status:   Code Status: Full Code  Mobility Assessment (last 72 hours)     Mobility Assessment     Row Name 04/27/22 [id number]           Does patient have an order for bedrest or is patient medically unstable No - Continue assessment       What is the highest level of mobility based on the progressive mobility assessment? Level 6 (Walks independently in room and hall) - Balance while walking in room without assist - Complete                Barriers to discharge:  Disposition Plan:  Home Status is: Inpt  Objective: Blood pressure 133/61, pulse 83, temperature 99.3 F (37.4 C), temperature source Oral, resp. rate 20, height 5\' 2"  (1.575 m), weight 98.8 kg, SpO2 98 %.  Examination:  Physical Exam Constitutional:      Appearance: Normal appearance.  HENT:     Head: Normocephalic and atraumatic.     Mouth/Throat:     Mouth: Mucous membranes are moist.  Eyes:     Extraocular Movements: Extraocular movements intact.  Cardiovascular:     Rate and Rhythm: Normal rate and regular rhythm.  Pulmonary:     Effort: Pulmonary effort is normal.     Breath sounds: Normal breath sounds.  Abdominal:     General: Bowel sounds are normal. There is no distension.     Palpations: Abdomen is soft.     Tenderness: There is no abdominal tenderness.     Comments: Right side noted with biliary  drain in place  Musculoskeletal:        General: Normal range of motion.     Cervical back: Normal range of motion and neck supple.  Skin:    General: Skin is warm and dry.  Neurological:     General: No focal deficit present.     Mental Status: She is alert.  Psychiatric:        Mood and Affect: Mood normal.        Behavior: Behavior normal.      Consultants:  GI IR  Procedures:  04/26/22: Successful fluoro guided placement of a right sided approach transhepatic 10 Fr biliary drainage catheter with end coiled and locked within the duodenum. Biliary drain connected to gravity bag.  Data Reviewed: Results for orders placed or performed during the hospital encounter of 04/17/22 (from the past 24 hour(s))  CBC with Differential/Platelet     Status: Abnormal   Collection Time: 04/29/22  6:11 AM  Result Value Ref Range   WBC 8.1 4.0 - 10.5 K/uL   RBC 2.60 (L) 3.87 - 5.11 MIL/uL   Hemoglobin 8.9 (L) 12.0 - 15.0 g/dL   HCT 27.6 (L) 36.0 -  46.0 %   MCV 106.2 (H) 80.0 - 100.0 fL   MCH 34.2 (H) 26.0 - 34.0 pg   MCHC 32.2 30.0 - 36.0 g/dL   RDW 19.3 (H) 11.5 - 15.5 %   Platelets 116 (L) 150 - 400 K/uL   nRBC 0.0 0.0 - 0.2 %   Neutrophils Relative % 86 %   Neutro Abs 6.9 1.7 - 7.7 K/uL   Lymphocytes Relative 4 %   Lymphs Abs 0.3 (L) 0.7 - 4.0 K/uL   Monocytes Relative 9 %   Monocytes Absolute 0.7 0.1 - 1.0 K/uL   Eosinophils Relative 0 %   Eosinophils Absolute 0.0 0.0 - 0.5 K/uL   Basophils Relative 0 %   Basophils Absolute 0.0 0.0 - 0.1 K/uL   Immature Granulocytes 1 %   Abs Immature Granulocytes 0.10 (H) 0.00 - 0.07 K/uL  Comprehensive metabolic panel     Status: Abnormal   Collection Time: 04/29/22  6:11 AM  Result Value Ref Range   Sodium 133 (L) 135 - 145 mmol/L   Potassium 4.0 3.5 - 5.1 mmol/L   Chloride 99 98 - 111 mmol/L   CO2 25 22 - 32 mmol/L   Glucose, Bld 112 (H) 70 - 99 mg/dL   BUN 14 8 - 23 mg/dL   Creatinine, Ser 0.55 0.44 - 1.00 mg/dL   Calcium 8.1 (L)  8.9 - 10.3 mg/dL   Total Protein 5.6 (L) 6.5 - 8.1 g/dL   Albumin 2.2 (L) 3.5 - 5.0 g/dL   AST 74 (H) 15 - 41 U/L   ALT 53 (H) 0 - 44 U/L   Alkaline Phosphatase 207 (H) 38 - 126 U/L   Total Bilirubin 18.2 (HH) 0.3 - 1.2 mg/dL   GFR, Estimated >60 >60 mL/min   Anion gap 9 5 - 15  Magnesium     Status: None   Collection Time: 04/29/22  6:11 AM  Result Value Ref Range   Magnesium 2.0 1.7 - 2.4 mg/dL     I have reviewed pertinent nursing notes, vitals, labs, and images as necessary. I have ordered labwork to follow up on as indicated.  I have reviewed the last notes from staff over past 24 hours. I have discussed patient's care plan and test results with nursing staff, CM/SW, and other staff as appropriate.  Time spent: Greater than 50% of the 55 minute visit was spent in counseling/coordination of care for the patient as laid out in the A&P.   LOS: 12 days   Dwyane Dee, MD [facility] 04/29/2022, 2:32 PM

## 2022-04-30 DIAGNOSIS — K831 Obstruction of bile duct: Secondary | ICD-10-CM | POA: Diagnosis not present

## 2022-04-30 LAB — CBC WITH DIFFERENTIAL/PLATELET
Abs Immature Granulocytes: 0.15 10*3/uL — ABNORMAL HIGH (ref 0.00–0.07)
Basophils Absolute: 0 10*3/uL (ref 0.0–0.1)
Basophils Relative: 0 %
Eosinophils Absolute: 0 10*3/uL (ref 0.0–0.5)
Eosinophils Relative: 0 %
HCT: 29.1 % — ABNORMAL LOW (ref 36.0–46.0)
Hemoglobin: 9.5 g/dL — ABNORMAL LOW (ref 12.0–15.0)
Immature Granulocytes: 1 %
Lymphocytes Relative: 3 %
Lymphs Abs: 0.4 10*3/uL — ABNORMAL LOW (ref 0.7–4.0)
MCH: 34.7 pg — ABNORMAL HIGH (ref 26.0–34.0)
MCHC: 32.6 g/dL (ref 30.0–36.0)
MCV: 106.2 fL — ABNORMAL HIGH (ref 80.0–100.0)
Monocytes Absolute: 1.2 10*3/uL — ABNORMAL HIGH (ref 0.1–1.0)
Monocytes Relative: 10 %
Neutro Abs: 10 10*3/uL — ABNORMAL HIGH (ref 1.7–7.7)
Neutrophils Relative %: 86 %
Platelets: 126 10*3/uL — ABNORMAL LOW (ref 150–400)
RBC: 2.74 MIL/uL — ABNORMAL LOW (ref 3.87–5.11)
RDW: 19 % — ABNORMAL HIGH (ref 11.5–15.5)
WBC: 11.8 10*3/uL — ABNORMAL HIGH (ref 4.0–10.5)
nRBC: 0 % (ref 0.0–0.2)

## 2022-04-30 LAB — COMPREHENSIVE METABOLIC PANEL
ALT: 55 U/L — ABNORMAL HIGH (ref 0–44)
AST: 71 U/L — ABNORMAL HIGH (ref 15–41)
Albumin: 2.3 g/dL — ABNORMAL LOW (ref 3.5–5.0)
Alkaline Phosphatase: 187 U/L — ABNORMAL HIGH (ref 38–126)
Anion gap: 9 (ref 5–15)
BUN: 15 mg/dL (ref 8–23)
CO2: 24 mmol/L (ref 22–32)
Calcium: 8.1 mg/dL — ABNORMAL LOW (ref 8.9–10.3)
Chloride: 98 mmol/L (ref 98–111)
Creatinine, Ser: 0.59 mg/dL (ref 0.44–1.00)
GFR, Estimated: >60 mL/min (ref >60)
Glucose, Bld: 126 mg/dL — ABNORMAL HIGH (ref 70–99)
Potassium: 3.8 mmol/L (ref 3.5–5.1)
Sodium: 131 mmol/L — ABNORMAL LOW (ref 135–145)
Total Bilirubin: 20.4 mg/dL (ref 0.3–1.2)
Total Protein: 6 g/dL — ABNORMAL LOW (ref 6.5–8.1)

## 2022-04-30 LAB — PROTIME-INR
INR: 1.4 — ABNORMAL HIGH (ref 0.8–1.2)
Prothrombin Time: 16.8 seconds — ABNORMAL HIGH (ref 11.4–15.2)

## 2022-04-30 LAB — MAGNESIUM: Magnesium: 2.2 mg/dL (ref 1.7–2.4)

## 2022-04-30 MED ORDER — SODIUM CHLORIDE 0.9 % IV SOLN
2.0000 g | Freq: Three times a day (TID) | INTRAVENOUS | Status: DC
  Administered 2022-04-30 – 2022-05-04 (×13): 2 g via INTRAVENOUS
  Filled 2022-04-30 (×13): qty 12.5

## 2022-04-30 MED ORDER — CALCIUM CARBONATE ANTACID 500 MG PO CHEW
2.0000 | CHEWABLE_TABLET | Freq: Three times a day (TID) | ORAL | Status: DC
  Administered 2022-04-30 – 2022-05-04 (×12): 400 mg via ORAL
  Filled 2022-04-30 (×12): qty 2

## 2022-04-30 MED ORDER — SODIUM CHLORIDE 0.9 % IV SOLN: INTRAVENOUS | Status: DC

## 2022-04-30 NOTE — Progress Notes (Signed)
Progress Note    DEDORAH SALOM   [id number]  DOB: 01/22/1951  DOA: 04/17/2022     13 PCP: Andre Lefort, FNP  Initial CC: elevated total [facility] Hospital Course: Ms. Knabb is a 72 yo female with PMH rectal cancer with liver metastasis who presented with mild abdominal discomfort and outpatient elevated total bilirubin.  This has been trended and found to not be resolving.  MRI liver was obtained on 04/14/2022 which showed underlying hypoenhancing central liver mass measuring 2.7 x 1.9 cm compatible with solitary recurrent liver metastasis.  Also showed underlying associated high-grade malignant biliary hilar stricture.  She was admitted for GI evaluation and expedited endoscopy for further evaluation.  She underwent ERCP and stent placement 3/19. Hospitalization complicated by post ERCP pancreatitis and rising LFT's. GI removed biliary stent on 3/22 and IR was consulted for external biliary drain. She underwent fluoro guided right sided transhepatic biliary drainage catheter with IR on 04/26/2022.  Interval History:  Epigastric pain still seems to be consistent with GERD (worse if laying flat and worse with OJ). Adding on tums today and rec'd continue maalox as needed.  Drain still having output but TB still climbing and does also have some lower L/R abd pain also. Still ambulating well and having BM and voiding well.   Assessment and Plan: * Biliary obstruction - Underwent MRI liver on 04/14/2022 showing high-grade malignant biliary hilar stricture - s/p ERCP 3/19 with localized biliary strictures found in the hepatitic duct system from extrahepatic mestastasis.  Strictures malignant appearing.  Treated with stent placement.  - s/p unasyn course on admission   - removal of biliary stent 3/22 - s/p external biliary drain in right hepatic bile duct 3/22 - CT abd with contrast 3/26 with R sided perc biliary drain with formed pigtail in central right hepatic ducts.  Diminished R  sided biliary ductal dilatation.  Similar appearance of L sided biliary ductal dilatation.  Ill defined hypodensity in central liver.  - GI now signed off - s/p fluoroscopy guided right sided approach biliary drainage catheter placed in IR on 04/26/2022; removal of initial biliary drain -Continue flushing drain 3 times daily  - TB has been uptrending along with remainder LFTs suggesting obstruction still. Radiology still following; potential need for repeat cholangiogram if labs continue to worsen  - WBC now uptrending on 3/31 and biliary fluid culture noted with Enterobacter; start on cefepime on 3/31  Rectal cancer ypT3ypN1a (1/23 LN) s/p neoadj chemoXRT, robotic LAR resection & diverting loop ileostomy 12/21/2017 - Follows closely outpatient with oncology; has been needed with Lonsurf; cycle 2 placed on hold recently due to hyperTB  Hypothyroid - Continue Synthroid  Hypertension - relatively controlled -No home meds noted on med rec  Post-ERCP acute pancreatitis-resolved as of 04/27/2022 -CT with persistent pancreatitis, symptoms have improved   Volume Overload Perihepatic and Perisplenic Ascites Right Sided Pleural Effusion - responded well to diuresis   Macrocytic Anemia - stable ~9-10 g/dL   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 Vein Thrombosis - no need for anticoagulation per discussion with oncology   Hyponatremia - stable    T8 Compression Fracture - noted, follow   Old records reviewed in assessment of this patient  Antimicrobials: Unasyn 04/18/22 >> 04/25/22 Cefepime 3/31 >> current   DVT prophylaxis:  enoxaparin (LOVENOX) injection 40 mg Start: 04/27/22 1000 SCDs Start: 04/17/22 1934   Code Status:   Code Status: Full Code  Mobility Assessment (last 72 hours)     Mobility Assessment  No documentation.           Barriers to discharge:  Disposition Plan:  Home Status is: Inpt  Objective: Blood pressure (!) 158/61, pulse 78, temperature 98 F (36.7 C),  temperature source Oral, resp. rate 18, height 5\' 2"  (1.575 m), weight 98.8 kg, SpO2 99 %.  Examination:  Physical Exam Constitutional:      Appearance: Normal appearance.  HENT:     Head: Normocephalic and atraumatic.     Mouth/Throat:     Mouth: Mucous membranes are moist.  Eyes:     Extraocular Movements: Extraocular movements intact.  Cardiovascular:     Rate and Rhythm: Normal rate and regular rhythm.  Pulmonary:     Effort: Pulmonary effort is normal.     Breath sounds: Normal breath sounds.  Abdominal:     General: Bowel sounds are normal. There is no distension.     Palpations: Abdomen is soft.     Tenderness: There is no abdominal tenderness.     Comments: Right side noted with biliary drain in place  Musculoskeletal:        General: Normal range of motion.     Cervical back: Normal range of motion and neck supple.  Skin:    General: Skin is warm and dry.  Neurological:     General: No focal deficit present.     Mental Status: She is alert.  Psychiatric:        Mood and Affect: Mood normal.        Behavior: Behavior normal.      Consultants:  GI IR  Procedures:  04/26/22: Successful fluoro guided placement of a right sided approach transhepatic 10 Fr biliary drainage catheter with end coiled and locked within the duodenum. Biliary drain connected to gravity bag.  Data Reviewed: Results for orders placed or performed during the hospital encounter of 04/17/22 (from the past 24 hour(s))  CBC with Differential/Platelet     Status: Abnormal   Collection Time: 04/30/22  5:00 AM  Result Value Ref Range   WBC 11.8 (H) 4.0 - 10.5 K/uL   RBC 2.74 (L) 3.87 - 5.11 MIL/uL   Hemoglobin 9.5 (L) 12.0 - 15.0 g/dL   HCT 29.1 (L) 36.0 - 46.0 %   MCV 106.2 (H) 80.0 - 100.0 fL   MCH 34.7 (H) 26.0 - 34.0 pg   MCHC 32.6 30.0 - 36.0 g/dL   RDW 19.0 (H) 11.5 - 15.5 %   Platelets 126 (L) 150 - 400 K/uL   nRBC 0.0 0.0 - 0.2 %   Neutrophils Relative % 86 %   Neutro Abs 10.0 (H)  1.7 - 7.7 K/uL   Lymphocytes Relative 3 %   Lymphs Abs 0.4 (L) 0.7 - 4.0 K/uL   Monocytes Relative 10 %   Monocytes Absolute 1.2 (H) 0.1 - 1.0 K/uL   Eosinophils Relative 0 %   Eosinophils Absolute 0.0 0.0 - 0.5 K/uL   Basophils Relative 0 %   Basophils Absolute 0.0 0.0 - 0.1 K/uL   Immature Granulocytes 1 %   Abs Immature Granulocytes 0.15 (H) 0.00 - 0.07 K/uL  Comprehensive metabolic panel     Status: Abnormal   Collection Time: 04/30/22  5:00 AM  Result Value Ref Range   Sodium 131 (L) 135 - 145 mmol/L   Potassium 3.8 3.5 - 5.1 mmol/L   Chloride 98 98 - 111 mmol/L   CO2 24 22 - 32 mmol/L   Glucose, Bld 126 (H) 70 - 99  mg/dL   BUN 15 8 - 23 mg/dL   Creatinine, Ser 0.59 0.44 - 1.00 mg/dL   Calcium 8.1 (L) 8.9 - 10.3 mg/dL   Total Protein 6.0 (L) 6.5 - 8.1 g/dL   Albumin 2.3 (L) 3.5 - 5.0 g/dL   AST 71 (H) 15 - 41 U/L   ALT 55 (H) 0 - 44 U/L   Alkaline Phosphatase 187 (H) 38 - 126 U/L   Total Bilirubin 20.4 (HH) 0.3 - 1.2 mg/dL   GFR, Estimated >60 >60 mL/min   Anion gap 9 5 - 15  Magnesium     Status: None   Collection Time: 04/30/22  5:00 AM  Result Value Ref Range   Magnesium 2.2 1.7 - 2.4 mg/dL  Protime-INR     Status: Abnormal   Collection Time: 04/30/22  5:00 AM  Result Value Ref Range   Prothrombin Time 16.8 (H) 11.4 - 15.2 seconds   INR 1.4 (H) 0.8 - 1.2     I have reviewed pertinent nursing notes, vitals, labs, and images as necessary. I have ordered labwork to follow up on as indicated.  I have reviewed the last notes from staff over past 24 hours. I have discussed patient's care plan and test results with nursing staff, CM/SW, and other staff as appropriate.  Time spent: Greater than 50% of the 55 minute visit was spent in counseling/coordination of care for the patient as laid out in the A&P.   LOS: 13 days   Dwyane Dee, MD [facility] 04/30/2022, 11:53 AM

## 2022-05-01 ENCOUNTER — Inpatient Hospital Stay (HOSPITAL_COMMUNITY): Payer: Medicare Other

## 2022-05-01 ENCOUNTER — Encounter (HOSPITAL_COMMUNITY): Payer: Self-pay | Admitting: Internal Medicine

## 2022-05-01 DIAGNOSIS — Z8500 Personal history of malignant neoplasm of unspecified digestive organ: Secondary | ICD-10-CM

## 2022-05-01 DIAGNOSIS — K8021 Calculus of gallbladder without cholecystitis with obstruction: Secondary | ICD-10-CM | POA: Diagnosis not present

## 2022-05-01 DIAGNOSIS — E039 Hypothyroidism, unspecified: Secondary | ICD-10-CM | POA: Diagnosis not present

## 2022-05-01 DIAGNOSIS — I10 Essential (primary) hypertension: Secondary | ICD-10-CM

## 2022-05-01 DIAGNOSIS — K831 Obstruction of bile duct: Secondary | ICD-10-CM | POA: Diagnosis not present

## 2022-05-01 LAB — COMPREHENSIVE METABOLIC PANEL
ALT: 49 U/L — ABNORMAL HIGH (ref 0–44)
AST: 61 U/L — ABNORMAL HIGH (ref 15–41)
Albumin: 2 g/dL — ABNORMAL LOW (ref 3.5–5.0)
Alkaline Phosphatase: 149 U/L — ABNORMAL HIGH (ref 38–126)
Anion gap: 7 (ref 5–15)
BUN: 13 mg/dL (ref 8–23)
CO2: 23 mmol/L (ref 22–32)
Calcium: 7.8 mg/dL — ABNORMAL LOW (ref 8.9–10.3)
Chloride: 101 mmol/L (ref 98–111)
Creatinine, Ser: 0.53 mg/dL (ref 0.44–1.00)
GFR, Estimated: >60 mL/min (ref >60)
Glucose, Bld: 120 mg/dL — ABNORMAL HIGH (ref 70–99)
Potassium: 3.9 mmol/L (ref 3.5–5.1)
Sodium: 131 mmol/L — ABNORMAL LOW (ref 135–145)
Total Bilirubin: 19.6 mg/dL (ref 0.3–1.2)
Total Protein: 5.4 g/dL — ABNORMAL LOW (ref 6.5–8.1)

## 2022-05-01 LAB — CBC WITH DIFFERENTIAL/PLATELET
Abs Immature Granulocytes: 0.18 10*3/uL — ABNORMAL HIGH (ref 0.00–0.07)
Basophils Absolute: 0 10*3/uL (ref 0.0–0.1)
Basophils Relative: 0 %
Eosinophils Absolute: 0 10*3/uL (ref 0.0–0.5)
Eosinophils Relative: 0 %
HCT: 26.9 % — ABNORMAL LOW (ref 36.0–46.0)
Hemoglobin: 8.8 g/dL — ABNORMAL LOW (ref 12.0–15.0)
Immature Granulocytes: 1 %
Lymphocytes Relative: 2 %
Lymphs Abs: 0.4 10*3/uL — ABNORMAL LOW (ref 0.7–4.0)
MCH: 35.1 pg — ABNORMAL HIGH (ref 26.0–34.0)
MCHC: 32.7 g/dL (ref 30.0–36.0)
MCV: 107.2 fL — ABNORMAL HIGH (ref 80.0–100.0)
Monocytes Absolute: 1.4 10*3/uL — ABNORMAL HIGH (ref 0.1–1.0)
Monocytes Relative: 9 %
Neutro Abs: 13.5 10*3/uL — ABNORMAL HIGH (ref 1.7–7.7)
Neutrophils Relative %: 88 %
Platelets: 108 10*3/uL — ABNORMAL LOW (ref 150–400)
RBC: 2.51 MIL/uL — ABNORMAL LOW (ref 3.87–5.11)
RDW: 18.6 % — ABNORMAL HIGH (ref 11.5–15.5)
WBC: 15.5 10*3/uL — ABNORMAL HIGH (ref 4.0–10.5)
nRBC: 0 % (ref 0.0–0.2)

## 2022-05-01 LAB — MAGNESIUM: Magnesium: 2.1 mg/dL (ref 1.7–2.4)

## 2022-05-01 LAB — LIPASE, BLOOD: Lipase: 27 U/L (ref 11–51)

## 2022-05-01 MED ORDER — SODIUM CHLORIDE (PF) 0.9 % IJ SOLN
INTRAMUSCULAR | Status: AC
  Filled 2022-05-01: qty 50

## 2022-05-01 MED ORDER — IOHEXOL 300 MG/ML  SOLN
100.0000 mL | Freq: Once | INTRAMUSCULAR | Status: AC | PRN
  Administered 2022-05-01: 100 mL via INTRAVENOUS

## 2022-05-01 NOTE — Progress Notes (Addendum)
Re-consult Note  DOA: 04/17/2022 Hospital Day: 15 Chief Complaint:  biliary obstruction in setting of metastatic rectal cancer     ASSESSMENT   72 yo female with the following:   Rectal cancer  ypT3ypN1a  s/p neoadj chemoXRT, robotic LAR resection & diverting loop ileostomy 12/21/2017.   Obstructive jaundice related to metastatic disease / biliary stricture.  3/22 >>  IR placement of right hepatic PBD.  3/27 >>  Internal / external drain was placed.   The left intrahepatic biliary system was unable to be traversed percutaneously from the right-sided approach secondary to malignant obstruction of the level of the hilum.  Interval history : Bilirubin increasing ( up to 19). Has leukocytosis.  CT scan today shows similar or slightly decreased intrahepatic biliary duct dilation: New small subcapsular fluid collection along the liver just superior to the entrance site of the PTC catheter. - The right ducts are drained / decompressed. Because of the central tumor, left ducts and some in anterior upper liver remain undrained, cut off from central ducts, but are not candidates for more bili drains.    ? Cholecystitis.  CT scan today shows Dilated gallbladder with stones and progressive wall thickening and wall edema.    PLAN   -Needs to be evaluated for acute cholecystitis. HIDA not ordered due to hyperbilirubinemia . Would ask that Surgery evaluate her.     Attending Physician Note   I have taken an interval history, reviewed the chart and examined the patient. I performed a substantive portion of this encounter, including complete performance of at least one of the key components, in conjunction with the APP. I agree with the APP's note, impression and recommendations with my edits. My additional impressions and recommendations are as follows.   Assessment: *Obstructive jaundice at hepatic duct level from metastatic rectal cancer with PBD in place. T bili has increased, now  19.6, however other LFTs are slightly improved.  CT AP today showed cholelithiasis, dilated GB, GB wall thickening, intrahepatic biliary dilation, PTC catheter in place, stable hepatic mass, scattered varices, splenomegaly, lung masses.  *Cholelithiasis, possible cholecystitis. GB wall thickening, GB dilation, RUQ pain increased over the past few days however today it has improved. WBC has increased to 15.5. *Metastatic rectal cancer  Recommendations: *Surgical consult - query cholecystitis. HIDA not ordered d/t markedly elevated total bilirubin. Continue Maxipime for now.  *Biliary obstruction at hepatic duct level, not amenable to ERCP/biliary stenting. No role for repeat ERCP. Left intrahepatic ducts and some anterior ducts were not able to be drained by PBD so LFTs/t bili may not improve or may improve very slowly. Further mgmt per IR and Oncology.  No additional GI recommendations.   Lucio Edward, MD [facility] See AM[facility], [facility], for our on call provider      Subjective / New events:   Has been having RUQ pain but today it is a little better. Appetite is okay. No N/V. She says the amount of bilious output is drainage bag is less compared to yesterday    05/01/22 CT  with contrast  MPRESSION: Indwelling right-sided PTC catheter. The level of the intrahepatic biliary duct dilatation is similar to slightly decreased. New small subcapsular fluid collection along the liver just superior to the entrance site of the PTC catheter.   Dilated gallbladder with stones. There is progressive wall thickening and wall edema. Please correlate with any clinical findings of acute cholecystitis. If needed dedicated workup such as HIDA scan could be considered  Stable appearance of the intrinsic liver mass segment 4 as well as mildly enlarged porta hepatic lymph node.   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 Trace ascites.   Lung masses  identified. Please correlate for history of metastatic disease to the chest. If needed additional chest CT dedicated workup can be performed when clinically appropriate. Persistent small right pleural effusio   Lab Results: Recent Labs    04/29/22 0611 04/30/22 0500 05/01/22 0500  WBC 8.1 11.8* 15.5*  HGB 8.9* 9.5* 8.8*  HCT 27.6* 29.1* 26.9*  PLT 116* 126* 108*   BMET Recent Labs    04/29/22 0611 04/30/22 0500 05/01/22 0500  NA 133* 131* 131*  K 4.0 3.8 3.9  CL 99 98 101  CO2 25 24 23   GLUCOSE 112* 126* 120*  BUN 14 15 13   CREATININE 0.55 0.59 0.53  CALCIUM 8.1* 8.1* 7.8*   LFT Recent Labs    05/01/22 0500  PROT 5.4*  ALBUMIN 2.0*  AST 61*  ALT 49*  ALKPHOS 149*  BILITOT 19.6*   PT/INR Recent Labs    04/30/22 0500  LABPROT 16.8*  INR 1.4*     Scheduled inpatient medications:   calcium carbonate  2 tablet Oral TID WC   Chlorhexidine Gluconate Cloth  6 each Topical Daily   enoxaparin (LOVENOX) injection  40 mg Subcutaneous Q24H   feeding supplement  1 Container Oral TID BM   levothyroxine  50 mcg Oral Daily   pantoprazole  40 mg Oral Daily   predniSONE  5 mg Oral Q breakfast   sodium chloride (PF)       sodium chloride flush  5 mL Intracatheter Q8H   sodium chloride flush  5 mL Intracatheter Q8H   Continuous inpatient infusions:   sodium chloride 75 mL/hr at 04/30/22 1107   ceFEPime (MAXIPIME) IV 2 g (05/01/22 0838)   PRN inpatient medications: acetaminophen **OR** acetaminophen, alum & mag hydroxide-simeth, bisacodyl, cyclobenzaprine, HYDROmorphone (DILAUDID) injection, ondansetron **OR** ondansetron (ZOFRAN) IV, oxyCODONE, sodium chloride (PF), traZODone  Vital signs in last 24 hours: Temp:  [98.1 F (36.7 C)-98.3 F (36.8 C)] 98.2 F (36.8 C) (04/01 1341) Pulse Rate:  [76-80] 76 (04/01 1341) Resp:  [16-20] 20 (04/01 1341) BP: (119-146)/(53-54) 119/53 (04/01 1341) SpO2:  [95 %-98 %] 98 % (04/01 1341) Last BM Date :  04/28/22  Intake/Output Summary (Last 24 hours) at 05/01/2022 1455 Last data filed at 05/01/2022 1400 Gross per 24 hour  Intake 2704.35 ml  Output 530 ml  Net 2174.35 ml    Intake/Output from previous day: 03/31 0701 - 04/01 0700 In: 1212.4 [P.O.:677; I.V.:305; IV Piggyback:220.3] Out: 500 [Urine:300; Drains:200] Intake/Output this shift: Total I/O In: 1969 [P.O.:240; I.V.:1424; Other:5; IV Piggyback:300] Out: 30 [Drains:30]   Physical Exam:  General: Alert female in NAD Heart:  Regular rate and rhythm.  Pulmonary: Normal respiratory effort Abdomen: Soft, nondistended, mild RUQ tenderness. Approx 100 cc bilious output in drainage bag.  Normal bowel sounds. Neurologic: Alert and oriented Psych: Pleasant. Cooperative. Insight appears normal.    Principal Problem:   Biliary obstruction Active Problems:   Hypertension   Hypothyroid   Rectal cancer ypT3ypN1a (1/23 LN) s/p neoadj chemoXRT, robotic LAR resection & diverting loop ileostomy 12/21/2017   Disorder of bile duct stent     LOS: 14 days   [name] ,NP 05/01/2022, 2:55 PM

## 2022-05-01 NOTE — Progress Notes (Signed)
Progress Note    Samantha Lutz   [id number]  DOB: [birth year]  DOA: 04/17/2022     14 PCP: Andre Lefort, FNP  Initial CC: elevated total [facility] Course: Samantha Lutz is a 72 yo female with PMH rectal cancer with liver metastasis who presented with mild abdominal discomfort and outpatient elevated total bilirubin.  This has been trended and found to not be resolving.  MRI liver was obtained on 04/14/2022 which showed underlying hypoenhancing central liver mass measuring 2.7 x 1.9 cm compatible with solitary recurrent liver metastasis.  Also showed underlying associated high-grade malignant biliary hilar stricture.  She was admitted for GI evaluation and expedited endoscopy for further evaluation.  She underwent ERCP and stent placement 3/19. Hospitalization complicated by post ERCP pancreatitis and rising LFT's. GI removed biliary stent on 3/22 and IR was consulted for external biliary drain. She underwent fluoro guided right sided transhepatic biliary drainage catheter with IR on 04/26/2022.  Interval History:  GERD sxms better but still having this persistent lower abd pain all across her lower abdomen. It is mild/moderate but doesn't prevent her from eating and she has been able to ambulate still and having bowel movements.  Repeat CT being pursued today as well.   Assessment and Plan: * Biliary obstruction - Underwent MRI liver on 04/14/2022 showing high-grade malignant biliary hilar stricture - s/p ERCP 3/19 with localized biliary strictures found in the hepatitic duct system from extrahepatic mestastasis.  Strictures malignant appearing.  Treated with stent placement.  - s/p unasyn course on admission   - removal of biliary stent 3/22 - s/p external biliary drain in right hepatic bile duct 3/22 - CT abd with contrast 3/26 with R sided perc biliary drain with formed pigtail in central right hepatic ducts.  Diminished R sided biliary ductal dilatation.  Similar  appearance of L sided biliary ductal dilatation.  Ill defined hypodensity in central liver.  - GI now signed off - s/p fluoroscopy guided right sided approach biliary drainage catheter placed in IR on 04/26/2022; removal of initial biliary drain -Continue flushing drain 3 times daily  - TB has been uptrending along with remainder LFTs suggesting obstruction still. Radiology still following; potential need for repeat cholangiogram if labs continue to worsen  - WBC now uptrending on 3/31 and biliary fluid culture noted with Enterobacter; start on cefepime on 3/31  - persistent lower abd pain and persistent elevation of liver enzymes; repeat CT 4/1 shows cholelithiasis with wall thickening and edema; new small subcapsular fluid collection superior to entrance site of PTC cath - will check lipase; if elevated may need to re-engage GI for possible repeat ECRP vs repeat MRCP as concern would be for obstructed gallstone  - follow up IR review of CT  Rectal cancer ypT3ypN1a (1/23 LN) s/p neoadj chemoXRT, robotic LAR resection & diverting loop ileostomy 12/21/2017 - Follows closely outpatient with oncology; has been needed with Lonsurf; cycle 2 placed on hold recently due to hyperTB  Hypothyroid - Continue Synthroid  Hypertension - relatively controlled -No home meds noted on med rec  Post-ERCP acute pancreatitis-resolved as of 04/27/2022 -CT with persistent pancreatitis, symptoms have improved   Volume Overload Perihepatic and Perisplenic Ascites Right Sided Pleural Effusion - responded well to diuresis   Macrocytic Anemia - stable ~9-10 g/dL   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 Vein Thrombosis - no need for anticoagulation per discussion with oncology   Hyponatremia - stable    T8 Compression Fracture - noted, follow   Old  records reviewed in assessment of this patient  Antimicrobials: Unasyn 04/18/22 >> 04/25/22 Cefepime 3/31 >> current   DVT prophylaxis:  enoxaparin (LOVENOX) injection 40 mg  Start: 04/27/22 1000 SCDs Start: 04/17/22 1934   Code Status:   Code Status: Full Code  Mobility Assessment (last 72 hours)     Mobility Assessment     Row Name 04/30/22 1535 04/30/22 0735         Does patient have an order for bedrest or is patient medically unstable No - Continue assessment No - Continue assessment      What is the highest level of mobility based on the progressive mobility assessment? Level 6 (Walks independently in room and hall) - Balance while walking in room without assist - Complete Level 6 (Walks independently in room and hall) - Balance while walking in room without assist - Complete               Barriers to discharge:  Disposition Plan:  Home Status is: Inpt  Objective: Blood pressure (!) 119/53, pulse 76, temperature 98.2 F (36.8 C), temperature source Oral, resp. rate 20, height 5\' 2"  (1.575 m), weight 98.8 kg, SpO2 98 %.  Examination:  Physical Exam Constitutional:      Appearance: Normal appearance.  HENT:     Head: Normocephalic and atraumatic.     Mouth/Throat:     Mouth: Mucous membranes are moist.  Eyes:     Extraocular Movements: Extraocular movements intact.  Cardiovascular:     Rate and Rhythm: Normal rate and regular rhythm.  Pulmonary:     Effort: Pulmonary effort is normal.     Breath sounds: Normal breath sounds.  Abdominal:     General: Bowel sounds are normal. There is no distension.     Palpations: Abdomen is soft.     Tenderness: There is no abdominal tenderness.     Comments: Right side noted with biliary drain in place  Musculoskeletal:        General: Normal range of motion.     Cervical back: Normal range of motion and neck supple.  Skin:    General: Skin is warm and dry.  Neurological:     General: No focal deficit present.     Mental Status: She is alert.  Psychiatric:        Mood and Affect: Mood normal.        Behavior: Behavior normal.      Consultants:  GI IR  Procedures:  04/26/22:  Successful fluoro guided placement of a right sided approach transhepatic 10 Fr biliary drainage catheter with end coiled and locked within the duodenum. Biliary drain connected to gravity bag.  Data Reviewed: Results for orders placed or performed during the hospital encounter of 04/17/22 (from the past 24 hour(s))  CBC with Differential/Platelet     Status: Abnormal   Collection Time: 05/01/22  5:00 AM  Result Value Ref Range   WBC 15.5 (H) 4.0 - 10.5 K/uL   RBC 2.51 (L) 3.87 - 5.11 MIL/uL   Hemoglobin 8.8 (L) 12.0 - 15.0 g/dL   HCT 26.9 (L) 36.0 - 46.0 %   MCV 107.2 (H) 80.0 - 100.0 fL   MCH 35.1 (H) 26.0 - 34.0 pg   MCHC 32.7 30.0 - 36.0 g/dL   RDW 18.6 (H) 11.5 - 15.5 %   Platelets 108 (L) 150 - 400 K/uL   nRBC 0.0 0.0 - 0.2 %   Neutrophils Relative % 88 %   Neutro Abs  13.5 (H) 1.7 - 7.7 K/uL   Lymphocytes Relative 2 %   Lymphs Abs 0.4 (L) 0.7 - 4.0 K/uL   Monocytes Relative 9 %   Monocytes Absolute 1.4 (H) 0.1 - 1.0 K/uL   Eosinophils Relative 0 %   Eosinophils Absolute 0.0 0.0 - 0.5 K/uL   Basophils Relative 0 %   Basophils Absolute 0.0 0.0 - 0.1 K/uL   Immature Granulocytes 1 %   Abs Immature Granulocytes 0.18 (H) 0.00 - 0.07 K/uL  Comprehensive metabolic panel     Status: Abnormal   Collection Time: 05/01/22  5:00 AM  Result Value Ref Range   Sodium 131 (L) 135 - 145 mmol/L   Potassium 3.9 3.5 - 5.1 mmol/L   Chloride 101 98 - 111 mmol/L   CO2 23 22 - 32 mmol/L   Glucose, Bld 120 (H) 70 - 99 mg/dL   BUN 13 8 - 23 mg/dL   Creatinine, Ser 0.53 0.44 - 1.00 mg/dL   Calcium 7.8 (L) 8.9 - 10.3 mg/dL   Total Protein 5.4 (L) 6.5 - 8.1 g/dL   Albumin 2.0 (L) 3.5 - 5.0 g/dL   AST 61 (H) 15 - 41 U/L   ALT 49 (H) 0 - 44 U/L   Alkaline Phosphatase 149 (H) 38 - 126 U/L   Total Bilirubin 19.6 (HH) 0.3 - 1.2 mg/dL   GFR, Estimated >60 >60 mL/min   Anion gap 7 5 - 15  Magnesium     Status: None   Collection Time: 05/01/22  5:00 AM  Result Value Ref Range   Magnesium 2.1 1.7  - 2.4 mg/dL     I have reviewed pertinent nursing notes, vitals, labs, and images as necessary. I have ordered labwork to follow up on as indicated.  I have reviewed the last notes from staff over past 24 hours. I have discussed patient's care plan and test results with nursing staff, CM/SW, and other staff as appropriate.  Time spent: Greater than 50% of the 55 minute visit was spent in counseling/coordination of care for the patient as laid out in the A&P.   LOS: 14 days   Dwyane Dee, MD [facility] 05/01/2022, 1:44 PM

## 2022-05-01 NOTE — Progress Notes (Signed)
Referring Physician(s): Ladell Pier  Supervising Physician: Arne Cleveland  Patient Status:  [facility] - In-pt  Chief Complaint: RUQ/epigastric pain, jaundice   Subjective: Pt remains sig jaundiced; also with epigastric/RUQ discomfort; denies N/V, fever/chills   Allergies: Epinephrine, Losartan, Latex, and Vectibix [panitumumab]  Medications: Prior to Admission medications   Medication Sig Start Date End Date Taking? Authorizing Provider  albuterol (PROVENTIL HFA) 108 (90 Base) MCG/ACT inhaler Inhale 2 puffs into the lungs every 6 (six) hours as needed for wheezing or shortness of breath. 08/03/20  Yes Owens Shark, NP  bisacodyl (DULCOLAX) 5 MG EC tablet Take 5 mg by mouth daily as needed for mild constipation or moderate constipation.   Yes [provider]  levothyroxine (SYNTHROID) 50 MCG tablet Take 1 tablet by mouth once daily Patient taking differently: Take 50 mcg by mouth daily before breakfast. 02/06/22  Yes Ladell Pier, MD  lidocaine-prilocaine (EMLA) cream Apply 1 Application topically as needed. Apply to port site 1-2 hours prior to use Patient taking differently: Apply 1 Application topically as needed (for port access- one to two hours prior to accessing). 12/12/21  Yes Owens Shark, NP  magic mouthwash SOLN Take 5 mLs by mouth 4 (four) times daily as needed for mouth pain. 5 ml swish and spit 4 times a daily as needed for mouth pain 09/14/20  Yes Ladell Pier, MD  magnesium oxide (MAG-OX) 400 (240 Mg) MG tablet Take 1 tablet (400 mg total) by mouth 2 (two) times daily. 02/22/22  Yes Ladell Pier, MD  Nutritional Supplements (ENSURE ORIGINAL) LIQD Take 237 mLs by mouth See admin instructions. Drink 237 ml's (1 CHOCOLATE shake) by mouth once a day   Yes [provider]  potassium chloride (MICRO-K) 10 MEQ CR capsule Take 10 mEq by mouth daily.   Yes [provider]  predniSONE (DELTASONE) 5 MG tablet Take 1 tablet by mouth once  daily with breakfast 03/15/22  Yes Ladell Pier, MD  prochlorperazine (COMPAZINE) 10 MG tablet Take 1 tablet (10 mg total) by mouth every 6 (six) hours as needed for nausea or vomiting. 03/01/21  Yes Owens Shark, NP  SYSTANE ULTRA PF 0.4-0.3 % SOLN Place 1 drop into both eyes 3 (three) times daily as needed (for dryness).   Yes [provider]  nystatin (MYCOSTATIN/NYSTOP) powder Apply 1 Application topically 3 (three) times daily. Patient not taking: Reported on 04/17/2022 12/12/21   Owens Shark, NP  Spacer/Aero-Holding Chambers (AEROCHAMBER MV) inhaler Use as instructed 12/31/19   Ladell Pier, MD  trifluridine-tipiracil (LONSURF) 20-8.19 MG tablet Take 2 tablets (40 mg Trifluridine) twice daily, take within 1 hr after AM & PM meals on days 1-5, 8-12. Repeat every 28 days. Start cycle on 04/10/22 Patient not taking: Reported on 04/17/2022 04/04/22   Ladell Pier, MD     Vital Signs: BP (!) 119/53 (BP Location: Right Arm)   Pulse 76   Temp 98.2 F (36.8 C) (Oral)   Resp 20   Ht 5\' 2"  (1.575 m)   Wt 217 lb 13 oz (98.8 kg)   LMP  (LMP Unknown)   SpO2 98%   BMI 39.84 kg/m   Physical Exam awake/alert; scleral icterus/jaundiced; biliary drain intact, insertion site ok, mild- mod tender; OP 200 cc yesterday green bile, drain flushed without difficulty  Imaging: CT ABDOMEN W CONTRAST  Result Date: 05/01/2022 CLINICAL DATA:  Biliary obstruction. Metastatic colon cancer. Stent placement. * Tracking Code: BO *  EXAM: CT ABDOMEN WITH CONTRAST TECHNIQUE: Multidetector CT imaging of the abdomen was performed using the standard protocol following bolus administration of intravenous contrast. RADIATION DOSE REDUCTION: This exam was performed according to the departmental dose-optimization program which includes automated exposure control, adjustment of the mA and/or kV according to patient size and/or use of iterative reconstruction technique. CONTRAST:  129mL OMNIPAQUE IOHEXOL 300  MG/ML  SOLN COMPARISON:  04/25/2022 CT FINDINGS: Lower chest: The majority of the chest was scanned as part of the examination from the level of the aortic arch down through the diaphragm. Chest port is seen. Heart is nonenlarged. Scattered vascular calcifications. 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 Slightly patulous esophagus. Lung nodules are identified. Example left upper lobe measuring 15 by 11 mm on series 6, image 28. There are additional larger areas along the right lower lobe but partially obscured by the adjacent lung opacity and small right pleural effusion. Dedicated chest CT may be useful to further delineate these findings in correlation to the patient's history of metastatic disease from colon cancer. Hepatobiliary: There has been placement of a right-sided indwelling PTC catheter with has distal pigtail extending into the second portion of the duodenal distally. There is a small subcapsular fluid collection just superior to the entrance site of the catheter on series 2, image 28 which is new from previous measuring 16 by 6 mm. There are areas of biliary ductal dilatation identified particularly left hepatic lobe. These are similar to slightly improved from the prior CT scan. No new areas of intrahepatic biliary ductal dilatation. Gallbladder once again is severely dilated with stones. There is also some wall thickening and edema today which is progressive from the prior examination. Central small liver mass identified in segment 4 on series 2, image 26 measuring 14 by 13 mm. Pancreas: Unremarkable. No pancreatic ductal dilatation or surrounding inflammatory changes. Spleen: Spleen is enlarged with a length of 13.9 cm. Previously 14.4 cm, similar. Adrenals/Urinary Tract: The adrenal glands are preserved. Kidneys are unremarkable. Stomach/Bowel: Visualized bowel is nondilated. Slight wall thickening along the duodenal, nonspecific. Surgical changes along the loops of bowel in the  right lower quadrant. Vascular/Lymphatic: Normal caliber aorta and IVC with some scattered vascular calcifications. There is a prominent lymph node along the portacaval region measuring 16 by 10 mm, mildly enlarged. In retrospect unchanged from prior. Few other adjacent small prominent nodes identified as well. Other: Mild free fluid.  No free intra-air. Musculoskeletal: Degenerative changes seen along the spine. IMPRESSION: Indwelling right-sided PTC catheter. The level of the intrahepatic biliary duct dilatation is similar to slightly decreased. New small subcapsular fluid collection along the liver just superior to the entrance site of the PTC catheter. Dilated gallbladder with stones. There is progressive wall thickening and wall edema. Please correlate with any clinical findings of acute cholecystitis. If needed dedicated workup such as HIDA scan could be considered Stable appearance of the intrinsic liver mass segment 4 as well as mildly enlarged porta hepatic lymph node. 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 Trace ascites. Lung masses identified. Please correlate for history of metastatic disease to the chest. If needed additional chest CT dedicated workup can be performed when clinically appropriate. Persistent small right pleural effusion. Electronically Signed   By: Jill Side M.D.   On: 05/01/2022 11:05    Labs:  CBC: Recent Labs    04/28/22 0500 04/29/22 0611 04/30/22 0500 05/01/22 0500  WBC 5.2 8.1 11.8* 15.5*  HGB 8.9* 8.9* 9.5*  8.8*  HCT 26.6* 27.6* 29.1* 26.9*  PLT 114* 116* 126* 108*    COAGS: Recent Labs    04/17/22 0841 04/24/22 0500 04/30/22 0500  INR 1.1 1.2 1.4*    BMP: Recent Labs    04/28/22 0500 04/29/22 0611 04/30/22 0500 05/01/22 0500  NA 135 133* 131* 131*  K 4.1 4.0 3.8 3.9  CL 101 99 98 101  CO2 26 25 24 23   GLUCOSE 100* 112* 126* 120*  BUN 11 14 15 13   CALCIUM 8.1* 8.1* 8.1* 7.8*   CREATININE 0.56 0.55 0.59 0.53  GFRNONAA >60 >60 >60 >60    LIVER FUNCTION TESTS: Recent Labs    04/28/22 0500 04/29/22 0611 04/30/22 0500 05/01/22 0500  BILITOT 17.3* 18.2* 20.4* 19.6*  AST 76* 74* 71* 61*  ALT 53* 53* 55* 49*  ALKPHOS 218* 207* 187* 149*  PROT 5.4* 5.6* 6.0* 5.4*  ALBUMIN 2.3* 2.2* 2.3* 2.0*    Assessment and Plan: 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 The level of the intrahepatic biliary duct dilatation is similar to slightly decreased. New small subcapsular fluid collection along the liver just superior to the entrance site of the PTC catheter.   Dilated gallbladder with stones. There is progressive wall thickening and wall edema. Please correlate with any clinical findings of acute cholecystitis. If needed dedicated workup such as HIDA scan could be considered   Stable appearance of the intrinsic liver mass segment 4 as well as mildly enlarged porta hepatic lymph node.   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 Trace ascites.   Lung masses identified. Please correlate for history of metastatic disease to the chest. If needed additional chest CT dedicated workup can be performed when clinically appropriate. Persistent small right pleural effusion.  Imaging studies were reviewed by Dr. Vernard Gambles- right ducts drained by catheter are decompressed. Because of central tumor, left ducts and some in anterior upper liver  remain undrained, cut off from central ducts, but not candidates for more bili drains; consider HIDA scan and/or surgery consult to address gallbladder   Electronically Signed: D. Rowe Robert, PA-C 05/01/2022, 1:46 PM   I spent a total of 15 Minutes at the the patient's bedside AND on the patient's hospital floor or unit, greater than 50% of which was counseling/coordinating care for biliary drain    Patient ID: Samantha Lutz, female   DOB: 05-15-1950, 72 y.o.   MRN: [id number]

## 2022-05-01 NOTE — Consult Note (Signed)
HPI: Samantha Lutz is an 72 y.o. female who is here for obstructive jaundice related metastatic rectal cancer.  She has been diagnosed with a malignant stricture at the level of the hilum.  ERCP and left sided stent was attempted on 3/19.  MRCP on 3/21 showed a central mass near the confluence of the right and left hepatic ducts.  IR placed R hepatic PBD on 3/22 and converted to internal/external drain on 3/27.  Her bilirubin continues to rise and is up to 19.6 today.  Wbc is also rising.  CT today showed worsening GB distention and edema and we were asked to evaluate for cholecystitis.    Past Medical History:  Diagnosis Date   Anemia    remote hx.   Arthritis    Bi lat knee   Cancer 07/2017   Rectal Cancer    Fatty liver    ultrasound December 2012   Hypothyroid 01/16/2013 dx   Irregular heartbeat    PER PATIENT REPORT; ONSET 20+YEARS AGO SAW CARDIOLOGY AT THE TIME C/O "ITS BEAT REALLY FAST FOR A MINUTE AND THEN STOPPED " DENIES ANY OTHER CARDIAC SX';  REPORTS CARDIO DID ECHO WHICH WAS NEGATIVE;  NOW COMES AND GOES ;    Lichen sclerosus    steroids as needed   Temporary low platelet count    SEE LAST LABS IN EPIC    Past Surgical History:  Procedure Laterality Date   APPENDECTOMY  1967   BILIARY STENT PLACEMENT N/A 04/18/2022   Procedure: BILIARY STENT PLACEMENT;  Surgeon: Gatha Mayer, MD;  [facility]: [facility];  Service: Gastroenterology;  Laterality: N/A;   [place]  08/09/2017   danis - polyps   ERCP N/A 04/18/2022   Procedure: ENDOSCOPIC RETROGRADE CHOLANGIOPANCREATOGRAPHY (ERCP);  Surgeon: Gatha Mayer, MD;  Location: [facility];  Service: Gastroenterology;  Laterality: N/A;   ESOPHAGOGASTRODUODENOSCOPY (EGD) WITH PROPOFOL N/A 04/21/2022   Procedure: ESOPHAGOGASTRODUODENOSCOPY (EGD) WITH PROPOFOL;  Surgeon: Gatha Mayer, MD;  [facility];  Service: Gastroenterology;  Laterality: N/A;   ILEOSTOMY CLOSURE N/A  07/10/2018   Procedure: ILEOSTOMY LOOP TAKEDOWN WITH TAP BLOCK;  Surgeon: [name], MD;  [facility];  Service: General;  Laterality: N/A;   IR CONVERT BILIARY DRAIN TO INT EXT BILIARY DRAIN  04/26/2022   IR IMAGING GUIDED PORT INSERTION  09/29/2019   IR PERCUTANEOUS TRANSHEPATIC CHOLANGIOGRAM  04/21/2022   OSTOMY N/A 12/21/2017   Procedure: DIVERTING LOOP OSTOMY;  Surgeon: [name], MD;  [facility];  Service: General;  Laterality: N/A;   PROCTOSCOPY N/A 12/21/2017   Procedure: RIGID PROCTOSCOPY;  Surgeon: [name], MD;  [facility];  Service: General;  Laterality: N/A;   RADIOLOGY WITH ANESTHESIA N/A 04/26/2022   Procedure: PERCUTANEOUS TRANSHEPATIC  CHOLANGIOGRAM;  Surgeon: Sandi Mariscal, MD;  [facility];  Service: Anesthesiology;  Laterality: N/A;   STENT REMOVAL  04/21/2022   Procedure: STENT REMOVAL;  Surgeon: Gatha Mayer, MD;  Location: [facility];  Service: Gastroenterology;;   XI ROBOTIC ASSISTED LOWER ANTERIOR RESECTION N/A 12/21/2017   Procedure: XI ROBOTIC ASSISTED LOWER ANTERIOR RESECTION;  Surgeon: [name], MD;  [facility];  Service: General;  Laterality: N/A;    Family History  Problem Relation Age of Onset   Diabetes Mother    Hypertension Mother    COPD Mother    Glaucoma Father 6   Stroke Maternal Grandmother 55   Hypertension Maternal Grandmother  Rheum arthritis Sister 62   Hypertension Daughter    Breast cancer Cousin    Colon cancer Neg Hx    Esophageal cancer Neg Hx    Liver cancer Neg Hx    Pancreatic cancer Neg Hx    Stomach cancer Neg Hx    Rectal cancer Neg Hx     Social:  reports that she has never smoked. She has never used smokeless tobacco. She reports that she does not drink alcohol and does not use drugs.  Allergies:  Allergies  Allergen Reactions   Epinephrine Palpitations   Losartan Hives   Latex Itching and Other (See Comments)    Skin redness   Vectibix [Panitumumab] Other (See Comments)  and Cough    Non-productive coughing with chest tightness.   Patient had hypersensitivity reaction to Vectibix. See progress note from 11/01/2021 at 4:33 PM. Patient able to complete infusion.     Medications: I have reviewed the patient's current medications.  Results for orders placed or performed during the hospital encounter of 04/17/22 (from the past 48 hour(s))  CBC with Differential/Platelet     Status: Abnormal   Collection Time: 04/30/22  5:00 AM  Result Value Ref Range   WBC 11.8 (H) 4.0 - 10.5 K/uL   RBC 2.74 (L) 3.87 - 5.11 MIL/uL   Hemoglobin 9.5 (L) 12.0 - 15.0 g/dL   HCT 29.1 (L) 36.0 - 46.0 %   MCV 106.2 (H) 80.0 - 100.0 fL   MCH 34.7 (H) 26.0 - 34.0 pg   MCHC 32.6 30.0 - 36.0 g/dL   RDW 19.0 (H) 11.5 - 15.5 %   Platelets 126 (L) 150 - 400 K/uL   nRBC 0.0 0.0 - 0.2 %   Neutrophils Relative % 86 %   Neutro Abs 10.0 (H) 1.7 - 7.7 K/uL   Lymphocytes Relative 3 %   Lymphs Abs 0.4 (L) 0.7 - 4.0 K/uL   Monocytes Relative 10 %   Monocytes Absolute 1.2 (H) 0.1 - 1.0 K/uL   Eosinophils Relative 0 %   Eosinophils Absolute 0.0 0.0 - 0.5 K/uL   Basophils Relative 0 %   Basophils Absolute 0.0 0.0 - 0.1 K/uL   Immature Granulocytes 1 %   Abs Immature Granulocytes 0.15 (H) 0.00 - 0.07 K/uL    Comment: Performed at [facility], [place] [address], [place], [place] [address]  Comprehensive metabolic panel     Status: Abnormal   Collection Time: 04/30/22  5:00 AM  Result Value Ref Range   Sodium 131 (L) 135 - 145 mmol/L   Potassium 3.8 3.5 - 5.1 mmol/L   Chloride 98 98 - 111 mmol/L   CO2 24 22 - 32 mmol/L   Glucose, Bld 126 (H) 70 - 99 mg/dL    Comment: Glucose reference range applies only to samples taken after fasting for at least 8 hours.   BUN 15 8 - 23 mg/dL   Creatinine, Ser 0.59 0.44 - 1.00 mg/dL   Calcium 8.1 (L) 8.9 - 10.3 mg/dL   Total Protein 6.0 (L) 6.5 - 8.1 g/dL   Albumin 2.3 (L) 3.5 - 5.0 g/dL   AST 71 (H) 15 - 41 U/L   ALT 55 (H) 0 - 44  U/L   Alkaline Phosphatase 187 (H) 38 - 126 U/L   Total Bilirubin 20.4 (HH) 0.3 - 1.2 mg/dL    Comment: CRITICAL VALUE NOTED. VALUE IS CONSISTENT WITH PREVIOUSLY REPORTED/CALLED VALUE   GFR, Estimated >60 >60 mL/min  Comment: (NOTE) Calculated using the CKD-EPI Creatinine Equation (2021)    Anion gap 9 5 - 15    Comment: Performed at [facility], [place] [address], [place], [place] [address]  Magnesium     Status: None   Collection Time: 04/30/22  5:00 AM  Result Value Ref Range   Magnesium 2.2 1.7 - 2.4 mg/dL    Comment: Performed at [facility], [place] [address], [place], [place] [address]  Protime-INR     Status: Abnormal   Collection Time: 04/30/22  5:00 AM  Result Value Ref Range   Prothrombin Time 16.8 (H) 11.4 - 15.2 seconds   INR 1.4 (H) 0.8 - 1.2    Comment: (NOTE) INR goal varies based on device and disease states. Performed at [facility], [place] [address] [place], [place] [address]   CBC with Differential/Platelet     Status: Abnormal   Collection Time: 05/01/22  5:00 AM  Result Value Ref Range   WBC 15.5 (H) 4.0 - 10.5 K/uL   RBC 2.51 (L) 3.87 - 5.11 MIL/uL   Hemoglobin 8.8 (L) 12.0 - 15.0 g/dL   HCT 26.9 (L) 36.0 - 46.0 %   MCV 107.2 (H) 80.0 - 100.0 fL   MCH 35.1 (H) 26.0 - 34.0 pg   MCHC 32.7 30.0 - 36.0 g/dL   RDW 18.6 (H) 11.5 - 15.5 %   Platelets 108 (L) 150 - 400 K/uL   nRBC 0.0 0.0 - 0.2 %   Neutrophils Relative % 88 %   Neutro Abs 13.5 (H) 1.7 - 7.7 K/uL   Lymphocytes Relative 2 %   Lymphs Abs 0.4 (L) 0.7 - 4.0 K/uL   Monocytes Relative 9 %   Monocytes Absolute 1.4 (H) 0.1 - 1.0 K/uL   Eosinophils Relative 0 %   Eosinophils Absolute 0.0 0.0 - 0.5 K/uL   Basophils Relative 0 %   Basophils Absolute 0.0 0.0 - 0.1 K/uL   Immature Granulocytes 1 %   Abs Immature Granulocytes 0.18 (H) 0.00 - 0.07 K/uL    Comment: Performed at [facility], [place] [address] [place], [place]  [address]  Comprehensive metabolic panel     Status: Abnormal   Collection Time: 05/01/22  5:00 AM  Result Value Ref Range   Sodium 131 (L) 135 - 145 mmol/L   Potassium 3.9 3.5 - 5.1 mmol/L   Chloride 101 98 - 111 mmol/L   CO2 23 22 - 32 mmol/L   Glucose, Bld 120 (H) 70 - 99 mg/dL    Comment: Glucose reference range applies only to samples taken after fasting for at least 8 hours.   BUN 13 8 - 23 mg/dL   Creatinine, Ser 0.53 0.44 - 1.00 mg/dL   Calcium 7.8 (L) 8.9 - 10.3 mg/dL   Total Protein 5.4 (L) 6.5 - 8.1 g/dL   Albumin 2.0 (L) 3.5 - 5.0 g/dL   AST 61 (H) 15 - 41 U/L   ALT 49 (H) 0 - 44 U/L   Alkaline Phosphatase 149 (H) 38 - 126 U/L   Total Bilirubin 19.6 (HH) 0.3 - 1.2 mg/dL    Comment: CRITICAL VALUE NOTED. VALUE IS CONSISTENT WITH PREVIOUSLY REPORTED/CALLED VALUE   GFR, Estimated >60 >60 mL/min    Comment: (NOTE) Calculated using the CKD-EPI Creatinine Equation (2021)    Anion gap 7 5 - 15    Comment: Performed at [facility], [place] [address], [place], [place] [address]  Magnesium     Status: None   Collection Time: 05/01/22  5:00 AM  Result Value Ref Range   Magnesium 2.1 1.7 - 2.4 mg/dL    Comment: Performed at [facility], [place] [address] [place], [place] [address]  Lipase, blood     Status: None   Collection Time: 05/01/22  5:00 AM  Result Value Ref Range   Lipase 27 11 - 51 U/L    Comment: Performed at [facility], [place] [address], [place], [place] [address]    CT ABDOMEN W CONTRAST  Result Date: 05/01/2022 CLINICAL DATA:  Biliary obstruction. Metastatic colon cancer. Stent placement. * Tracking Code: BO * EXAM: CT ABDOMEN WITH CONTRAST TECHNIQUE: Multidetector CT imaging of the abdomen was performed using the standard protocol following bolus administration of intravenous contrast. RADIATION DOSE REDUCTION: This exam was performed according to the departmental dose-optimization program which includes automated  exposure control, adjustment of the mA and/or kV according to patient size and/or use of iterative reconstruction technique. CONTRAST:  156mL OMNIPAQUE IOHEXOL 300 MG/ML  SOLN COMPARISON:  04/25/2022 CT FINDINGS: Lower chest: The majority of the chest was scanned as part of the examination from the level of the aortic arch down through the diaphragm. Chest port is seen. Heart is nonenlarged. Scattered vascular calcifications. 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 Slightly patulous esophagus. Lung nodules are identified. Example left upper lobe measuring 15 by 11 mm on series 6, image 28. There are additional larger areas along the right lower lobe but partially obscured by the adjacent lung opacity and small right pleural effusion. Dedicated chest CT may be useful to further delineate these findings in correlation to the patient's history of metastatic disease from colon cancer. Hepatobiliary: There has been placement of a right-sided indwelling PTC catheter with has distal pigtail extending into the second portion of the duodenal distally. There is a small subcapsular fluid collection just superior to the entrance site of the catheter on series 2, image 28 which is new from previous measuring 16 by 6 mm. There are areas of biliary ductal dilatation identified particularly left hepatic lobe. These are similar to slightly improved from the prior CT scan. No new areas of intrahepatic biliary ductal dilatation. Gallbladder once again is severely dilated with stones. There is also some wall thickening and edema today which is progressive from the prior examination. Central small liver mass identified in segment 4 on series 2, image 26 measuring 14 by 13 mm. Pancreas: Unremarkable. No pancreatic ductal dilatation or surrounding inflammatory changes. Spleen: Spleen is enlarged with a length of 13.9 cm. Previously 14.4 cm, similar. Adrenals/Urinary Tract: The adrenal glands are preserved. Kidneys are  unremarkable. Stomach/Bowel: Visualized bowel is nondilated. Slight wall thickening along the duodenal, nonspecific. Surgical changes along the loops of bowel in the right lower quadrant. Vascular/Lymphatic: Normal caliber aorta and IVC with some scattered vascular calcifications. There is a prominent lymph node along the portacaval region measuring 16 by 10 mm, mildly enlarged. In retrospect unchanged from prior. Few other adjacent small prominent nodes identified as well. Other: Mild free fluid.  No free intra-air. Musculoskeletal: Degenerative changes seen along the spine. IMPRESSION: Indwelling right-sided PTC catheter. The level of the intrahepatic biliary duct dilatation is similar to slightly decreased. New small subcapsular fluid collection along the liver just superior to the entrance site of the PTC catheter. Dilated gallbladder with stones. There is progressive wall thickening and wall edema. Please correlate with any clinical findings of acute  cholecystitis. If needed dedicated workup such as HIDA scan could be considered Stable appearance of the intrinsic liver mass segment 4 as well as mildly enlarged porta hepatic lymph node. 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 Trace ascites. Lung masses identified. Please correlate for history of metastatic disease to the chest. If needed additional chest CT dedicated workup can be performed when clinically appropriate. Persistent small right pleural effusion. Electronically Signed   By: Jill Side M.D.   On: 05/01/2022 11:05    ROS - all of the below systems have been reviewed with the patient and positives are indicated with bold text General: chills, fever or night sweats Eyes: blurry vision or double vision ENT: epistaxis or sore throat Allergy/Immunology: itchy/watery eyes or nasal congestion Hematologic/Lymphatic: bleeding problems, blood clots or swollen lymph nodes Endocrine: temperature  intolerance or unexpected weight changes Breast: new or changing breast lumps or nipple discharge Resp: cough, shortness of breath, or wheezing CV: chest pain or dyspnea on exertion GI: as per HPI GU: dysuria, trouble voiding, or hematuria MSK: joint pain or joint stiffness Neuro: TIA or stroke symptoms Derm: pruritus and skin lesion changes Psych: anxiety and depression  PE Blood pressure (!) 119/53, pulse 76, temperature 98.2 F (36.8 C), temperature source Oral, resp. rate 20, height 5\' 2"  (1.575 m), weight 98.8 kg, SpO2 98 %. Constitutional: NAD; conversant; no deformities Eyes: Moist conjunctiva; no lid lag; anicteric; PERRL Neck: Trachea midline; no thyromegaly Lungs: Normal respiratory effort; no tactile fremitus CV: RRR; no palpable thrills; no pitting edema GI: Abd soft, TTP RUQ; no palpable hepatosplenomegaly MSK: Normal range of motion of extremities; no clubbing/cyanosis Psychiatric: Appropriate affect; alert and oriented x3 Lymphatic: No palpable cervical or axillary lymphadenopathy  Results for orders placed or performed during the hospital encounter of 04/17/22 (from the past 48 hour(s))  CBC with Differential/Platelet     Status: Abnormal   Collection Time: 04/30/22  5:00 AM  Result Value Ref Range   WBC 11.8 (H) 4.0 - 10.5 K/uL   RBC 2.74 (L) 3.87 - 5.11 MIL/uL   Hemoglobin 9.5 (L) 12.0 - 15.0 g/dL   HCT 29.1 (L) 36.0 - 46.0 %   MCV 106.2 (H) 80.0 - 100.0 fL   MCH 34.7 (H) 26.0 - 34.0 pg   MCHC 32.6 30.0 - 36.0 g/dL   RDW 19.0 (H) 11.5 - 15.5 %   Platelets 126 (L) 150 - 400 K/uL   nRBC 0.0 0.0 - 0.2 %   Neutrophils Relative % 86 %   Neutro Abs 10.0 (H) 1.7 - 7.7 K/uL   Lymphocytes Relative 3 %   Lymphs Abs 0.4 (L) 0.7 - 4.0 K/uL   Monocytes Relative 10 %   Monocytes Absolute 1.2 (H) 0.1 - 1.0 K/uL   Eosinophils Relative 0 %   Eosinophils Absolute 0.0 0.0 - 0.5 K/uL   Basophils Relative 0 %   Basophils Absolute 0.0 0.0 - 0.1 K/uL   Immature Granulocytes  1 %   Abs Immature Granulocytes 0.15 (H) 0.00 - 0.07 K/uL    Comment: Performed at [facility], [place] [address] [place], [place] [address]  Comprehensive metabolic panel     Status: Abnormal   Collection Time: 04/30/22  5:00 AM  Result Value Ref Range   Sodium 131 (L) 135 - 145 mmol/L   Potassium 3.8 3.5 - 5.1 mmol/L   Chloride 98 98 - 111 mmol/L   CO2 24 22 - 32 mmol/L  Glucose, Bld 126 (H) 70 - 99 mg/dL    Comment: Glucose reference range applies only to samples taken after fasting for at least 8 hours.   BUN 15 8 - 23 mg/dL   Creatinine, Ser 0.59 0.44 - 1.00 mg/dL   Calcium 8.1 (L) 8.9 - 10.3 mg/dL   Total Protein 6.0 (L) 6.5 - 8.1 g/dL   Albumin 2.3 (L) 3.5 - 5.0 g/dL   AST 71 (H) 15 - 41 U/L   ALT 55 (H) 0 - 44 U/L   Alkaline Phosphatase 187 (H) 38 - 126 U/L   Total Bilirubin 20.4 (HH) 0.3 - 1.2 mg/dL    Comment: CRITICAL VALUE NOTED. VALUE IS CONSISTENT WITH PREVIOUSLY REPORTED/CALLED VALUE   GFR, Estimated >60 >60 mL/min    Comment: (NOTE) Calculated using the CKD-EPI Creatinine Equation (2021)    Anion gap 9 5 - 15    Comment: Performed at [facility], [place] [address], [place], [place] [address]  Magnesium     Status: None   Collection Time: 04/30/22  5:00 AM  Result Value Ref Range   Magnesium 2.2 1.7 - 2.4 mg/dL    Comment: Performed at [facility], [place] [address], [place], [place] [address]  Protime-INR     Status: Abnormal   Collection Time: 04/30/22  5:00 AM  Result Value Ref Range   Prothrombin Time 16.8 (H) 11.4 - 15.2 seconds   INR 1.4 (H) 0.8 - 1.2    Comment: (NOTE) INR goal varies based on device and disease states. Performed at [facility], [place] [address], [place], [place] [address]   CBC with Differential/Platelet     Status: Abnormal   Collection Time: 05/01/22  5:00 AM  Result Value Ref Range   WBC 15.5 (H) 4.0 - 10.5 K/uL   RBC 2.51 (L) 3.87 - 5.11 MIL/uL    Hemoglobin 8.8 (L) 12.0 - 15.0 g/dL   HCT 26.9 (L) 36.0 - 46.0 %   MCV 107.2 (H) 80.0 - 100.0 fL   MCH 35.1 (H) 26.0 - 34.0 pg   MCHC 32.7 30.0 - 36.0 g/dL   RDW 18.6 (H) 11.5 - 15.5 %   Platelets 108 (L) 150 - 400 K/uL   nRBC 0.0 0.0 - 0.2 %   Neutrophils Relative % 88 %   Neutro Abs 13.5 (H) 1.7 - 7.7 K/uL   Lymphocytes Relative 2 %   Lymphs Abs 0.4 (L) 0.7 - 4.0 K/uL   Monocytes Relative 9 %   Monocytes Absolute 1.4 (H) 0.1 - 1.0 K/uL   Eosinophils Relative 0 %   Eosinophils Absolute 0.0 0.0 - 0.5 K/uL   Basophils Relative 0 %   Basophils Absolute 0.0 0.0 - 0.1 K/uL   Immature Granulocytes 1 %   Abs Immature Granulocytes 0.18 (H) 0.00 - 0.07 K/uL    Comment: Performed at [facility], [place] [address], [place], [place] [address]  Comprehensive metabolic panel     Status: Abnormal   Collection Time: 05/01/22  5:00 AM  Result Value Ref Range   Sodium 131 (L) 135 - 145 mmol/L   Potassium 3.9 3.5 - 5.1 mmol/L   Chloride 101 98 - 111 mmol/L   CO2 23 22 - 32 mmol/L   Glucose, Bld 120 (H) 70 - 99 mg/dL    Comment: Glucose reference range applies only to samples taken after fasting for at least 8 hours.   BUN 13 8 - 23 mg/dL  Creatinine, Ser 0.53 0.44 - 1.00 mg/dL   Calcium 7.8 (L) 8.9 - 10.3 mg/dL   Total Protein 5.4 (L) 6.5 - 8.1 g/dL   Albumin 2.0 (L) 3.5 - 5.0 g/dL   AST 61 (H) 15 - 41 U/L   ALT 49 (H) 0 - 44 U/L   Alkaline Phosphatase 149 (H) 38 - 126 U/L   Total Bilirubin 19.6 (HH) 0.3 - 1.2 mg/dL    Comment: CRITICAL VALUE NOTED. VALUE IS CONSISTENT WITH PREVIOUSLY REPORTED/CALLED VALUE   GFR, Estimated >60 >60 mL/min    Comment: (NOTE) Calculated using the CKD-EPI Creatinine Equation (2021)    Anion gap 7 5 - 15    Comment: Performed at [facility], [place] [address], [place], [place] [address]  Magnesium     Status: None   Collection Time: 05/01/22  5:00 AM  Result Value Ref Range   Magnesium 2.1 1.7 - 2.4 mg/dL    Comment:  Performed at [facility], [place] [address], [place], [place] [address]  Lipase, blood     Status: None   Collection Time: 05/01/22  5:00 AM  Result Value Ref Range   Lipase 27 11 - 51 U/L    Comment: Performed at [facility], [place] [address], [place], [place] [address]    CT ABDOMEN W CONTRAST  Result Date: 05/01/2022 CLINICAL DATA:  Biliary obstruction. Metastatic colon cancer. Stent placement. * Tracking Code: BO * EXAM: CT ABDOMEN WITH CONTRAST TECHNIQUE: Multidetector CT imaging of the abdomen was performed using the standard protocol following bolus administration of intravenous contrast. RADIATION DOSE REDUCTION: This exam was performed according to the departmental dose-optimization program which includes automated exposure control, adjustment of the mA and/or kV according to patient size and/or use of iterative reconstruction technique. CONTRAST:  128mL OMNIPAQUE IOHEXOL 300 MG/ML  SOLN COMPARISON:  04/25/2022 CT FINDINGS: Lower chest: The majority of the chest was scanned as part of the examination from the level of the aortic arch down through the diaphragm. Chest port is seen. Heart is nonenlarged. Scattered vascular calcifications. 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 Slightly patulous esophagus. Lung nodules are identified. Example left upper lobe measuring 15 by 11 mm on series 6, image 28. There are additional larger areas along the right lower lobe but partially obscured by the adjacent lung opacity and small right pleural effusion. Dedicated chest CT may be useful to further delineate these findings in correlation to the patient's history of metastatic disease from colon cancer. Hepatobiliary: There has been placement of a right-sided indwelling PTC catheter with has distal pigtail extending into the second portion of the duodenal distally. There is a small subcapsular fluid collection just superior to the entrance site of the  catheter on series 2, image 28 which is new from previous measuring 16 by 6 mm. There are areas of biliary ductal dilatation identified particularly left hepatic lobe. These are similar to slightly improved from the prior CT scan. No new areas of intrahepatic biliary ductal dilatation. Gallbladder once again is severely dilated with stones. There is also some wall thickening and edema today which is progressive from the prior examination. Central small liver mass identified in segment 4 on series 2, image 26 measuring 14 by 13 mm. Pancreas: Unremarkable. No pancreatic ductal dilatation or surrounding inflammatory changes. Spleen: Spleen is enlarged with a length of 13.9 cm. Previously 14.4 cm, similar. Adrenals/Urinary Tract: The adrenal glands are preserved. Kidneys are unremarkable. Stomach/Bowel: Visualized bowel is  nondilated. Slight wall thickening along the duodenal, nonspecific. Surgical changes along the loops of bowel in the right lower quadrant. Vascular/Lymphatic: Normal caliber aorta and IVC with some scattered vascular calcifications. There is a prominent lymph node along the portacaval region measuring 16 by 10 mm, mildly enlarged. In retrospect unchanged from prior. Few other adjacent small prominent nodes identified as well. Other: Mild free fluid.  No free intra-air. Musculoskeletal: Degenerative changes seen along the spine. IMPRESSION: Indwelling right-sided PTC catheter. The level of the intrahepatic biliary duct dilatation is similar to slightly decreased. New small subcapsular fluid collection along the liver just superior to the entrance site of the PTC catheter. Dilated gallbladder with stones. There is progressive wall thickening and wall edema. Please correlate with any clinical findings of acute cholecystitis. If needed dedicated workup such as HIDA scan could be considered Stable appearance of the intrinsic liver mass segment 4 as well as mildly enlarged porta hepatic lymph node.  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 Trace ascites. Lung masses identified. Please correlate for history of metastatic disease to the chest. If needed additional chest CT dedicated workup can be performed when clinically appropriate. Persistent small right pleural effusion. Electronically Signed   By: Jill Side M.D.   On: 05/01/2022 11:05    I have personally reviewed the relevant labwork, CT scan, MRCP, ERCP report and consultation notes.  A/P: SHAMEL LAUWERS is an 72 y.o. female with metastatic rectal cancer and obstructive jaundice due to central liver mass.  Most recent CT shows minimal bile duct dilation but bilirubin continues to rise.  Will get a fractionated bilirubin and RUQ [place] to eval further.     I spent a total of 65 minutes in both face-to-face and non-face-to-face activities, excluding procedures performed, for this visit on the date of this encounter.  Rosario Adie, MD  [facility]

## 2022-05-02 ENCOUNTER — Inpatient Hospital Stay: Payer: Medicare Other

## 2022-05-02 ENCOUNTER — Inpatient Hospital Stay: Payer: Medicare Other | Admitting: Nurse Practitioner

## 2022-05-02 ENCOUNTER — Other Ambulatory Visit: Payer: Self-pay

## 2022-05-02 ENCOUNTER — Inpatient Hospital Stay (HOSPITAL_COMMUNITY): Payer: Medicare Other

## 2022-05-02 ENCOUNTER — Other Ambulatory Visit (HOSPITAL_COMMUNITY): Payer: Self-pay

## 2022-05-02 DIAGNOSIS — K8021 Calculus of gallbladder without cholecystitis with obstruction: Secondary | ICD-10-CM | POA: Diagnosis not present

## 2022-05-02 DIAGNOSIS — K831 Obstruction of bile duct: Secondary | ICD-10-CM | POA: Diagnosis not present

## 2022-05-02 DIAGNOSIS — E039 Hypothyroidism, unspecified: Secondary | ICD-10-CM | POA: Diagnosis not present

## 2022-05-02 DIAGNOSIS — Z8500 Personal history of malignant neoplasm of unspecified digestive organ: Secondary | ICD-10-CM | POA: Diagnosis not present

## 2022-05-02 DIAGNOSIS — C20 Malignant neoplasm of rectum: Secondary | ICD-10-CM | POA: Diagnosis not present

## 2022-05-02 DIAGNOSIS — D649 Anemia, unspecified: Secondary | ICD-10-CM

## 2022-05-02 DIAGNOSIS — I10 Essential (primary) hypertension: Secondary | ICD-10-CM | POA: Diagnosis not present

## 2022-05-02 LAB — COMPREHENSIVE METABOLIC PANEL
ALT: 43 U/L (ref 0–44)
AST: 48 U/L — ABNORMAL HIGH (ref 15–41)
Albumin: 1.8 g/dL — ABNORMAL LOW (ref 3.5–5.0)
Alkaline Phosphatase: 134 U/L — ABNORMAL HIGH (ref 38–126)
Anion gap: 6 (ref 5–15)
BUN: 17 mg/dL (ref 8–23)
CO2: 20 mmol/L — ABNORMAL LOW (ref 22–32)
Calcium: 7.7 mg/dL — ABNORMAL LOW (ref 8.9–10.3)
Chloride: 106 mmol/L (ref 98–111)
Creatinine, Ser: 0.54 mg/dL (ref 0.44–1.00)
GFR, Estimated: >60 mL/min (ref >60)
Glucose, Bld: 102 mg/dL — ABNORMAL HIGH (ref 70–99)
Potassium: 3.4 mmol/L — ABNORMAL LOW (ref 3.5–5.1)
Sodium: 132 mmol/L — ABNORMAL LOW (ref 135–145)
Total Bilirubin: 16.3 mg/dL — ABNORMAL HIGH (ref 0.3–1.2)
Total Protein: 5.2 g/dL — ABNORMAL LOW (ref 6.5–8.1)

## 2022-05-02 LAB — CBC WITH DIFFERENTIAL/PLATELET
Abs Immature Granulocytes: 0.05 10*3/uL (ref 0.00–0.07)
Basophils Absolute: 0 10*3/uL (ref 0.0–0.1)
Basophils Relative: 0 %
Eosinophils Absolute: 0.1 10*3/uL (ref 0.0–0.5)
Eosinophils Relative: 1 %
HCT: 23.5 % — ABNORMAL LOW (ref 36.0–46.0)
Hemoglobin: 7.6 g/dL — ABNORMAL LOW (ref 12.0–15.0)
Immature Granulocytes: 1 %
Lymphocytes Relative: 5 %
Lymphs Abs: 0.3 10*3/uL — ABNORMAL LOW (ref 0.7–4.0)
MCH: 35.2 pg — ABNORMAL HIGH (ref 26.0–34.0)
MCHC: 32.3 g/dL (ref 30.0–36.0)
MCV: 108.8 fL — ABNORMAL HIGH (ref 80.0–100.0)
Monocytes Absolute: 0.9 10*3/uL (ref 0.1–1.0)
Monocytes Relative: 13 %
Neutro Abs: 5.2 10*3/uL (ref 1.7–7.7)
Neutrophils Relative %: 80 %
Platelets: 85 10*3/uL — ABNORMAL LOW (ref 150–400)
RBC: 2.16 MIL/uL — ABNORMAL LOW (ref 3.87–5.11)
RDW: 18.3 % — ABNORMAL HIGH (ref 11.5–15.5)
WBC: 6.5 10*3/uL (ref 4.0–10.5)
nRBC: 0 % (ref 0.0–0.2)

## 2022-05-02 LAB — MAGNESIUM: Magnesium: 2.2 mg/dL (ref 1.7–2.4)

## 2022-05-02 MED ORDER — POTASSIUM CHLORIDE CRYS ER 20 MEQ PO TBCR
40.0000 meq | EXTENDED_RELEASE_TABLET | Freq: Once | ORAL | Status: AC
  Administered 2022-05-02: 40 meq via ORAL
  Filled 2022-05-02: qty 2

## 2022-05-02 NOTE — Progress Notes (Signed)
Progress Note    Samantha Lutz   [id number]  DOB: 19-Jul-1950  DOA: 04/17/2022     15 PCP: Andre Lefort, FNP  Initial CC: elevated total [facility] Course: Samantha Lutz is a 72 yo female with PMH rectal cancer with liver metastasis who presented with mild abdominal discomfort and outpatient elevated total bilirubin.  This has been trended and found to not be resolving.  MRI liver was obtained on 04/14/2022 which showed underlying hypoenhancing central liver mass measuring 2.7 x 1.9 cm compatible with solitary recurrent liver metastasis.  Also showed underlying associated high-grade malignant biliary hilar stricture.  She was admitted for GI evaluation and expedited endoscopy for further evaluation.  She underwent ERCP and stent placement 3/19. Hospitalization complicated by post ERCP pancreatitis and rising LFT's. GI removed biliary stent on 3/22 and IR was consulted for external biliary drain. She underwent fluoro guided right sided transhepatic biliary drainage catheter with IR on 04/26/2022.  Interval History:  No events overnight.  Seems to be slowly improving; less abdominal pain this morning.  Still able to ambulate and tolerating diet.  No nausea/vomiting.  Assessment and Plan: * Biliary obstruction - Underwent MRI liver on 04/14/2022 showing high-grade malignant biliary hilar stricture - s/p ERCP 3/19 with localized biliary strictures found in the hepatitic duct system from extrahepatic mestastasis.  Strictures malignant appearing.  Treated with stent placement.  - s/p unasyn course on admission   - removal of biliary stent 3/22 - s/p external biliary drain in right hepatic bile duct 3/22 - CT abd with contrast 3/26 with R sided perc biliary drain with formed pigtail in central right hepatic ducts.  Diminished R sided biliary ductal dilatation.  Similar appearance of L sided biliary ductal dilatation.  Ill defined hypodensity in central liver.  - GI now signed off -  s/p fluoroscopy guided right sided approach biliary drainage catheter placed in IR on 04/26/2022; removal of initial biliary drain -Continue flushing drain 3 times daily  - TB has been uptrending along with remainder LFTs suggesting obstruction still. Radiology still following; potential need for repeat cholangiogram if labs continue to worsen  - WBC now uptrending on 3/31 and biliary fluid culture noted with Enterobacter; started on cefepime on 3/31; likely treat 5-7 days?  - persistent lower abd pain and persistent elevation of liver enzymes; repeat CT 4/1 shows cholelithiasis with wall thickening and edema; new small subcapsular fluid collection superior to entrance site of PTC cath - lipase normal; case discussed with GI and surgery - no additional GI rec's, appreciate re-eval; signed off 4/2 - surgery rec's no CCY but if ongoing pain, then possibly a perc drain; IR also following - for now, seems LFTs finally starting to downtrend; continue monitoring CMP and clinically for acute chole   Rectal cancer [id number] (1/23 LN) s/p neoadj chemoXRT, robotic LAR resection & diverting loop ileostomy 12/21/2017 - Follows closely outpatient with oncology; has been needed with Lonsurf; cycle 2 placed on hold recently due to hyperTB  Hypothyroid - Continue Synthroid  Hypertension - relatively controlled -No home meds noted on med rec  Post-ERCP acute pancreatitis-resolved as of 04/27/2022 - resolved    Volume Overload Perihepatic and Perisplenic Ascites Right Sided Pleural Effusion - responded well to diuresis   Macrocytic Anemia - stable ~9-10 g/dL   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 Vein Thrombosis - no need for anticoagulation per discussion with oncology   Hyponatremia - stable    T8 Compression Fracture - noted, follow   Old  records reviewed in assessment of this patient  Antimicrobials: Unasyn 04/18/22 >> 04/25/22 Cefepime 3/31 >> current   DVT prophylaxis:  enoxaparin (LOVENOX) injection  40 mg Start: 04/27/22 1000 SCDs Start: 04/17/22 1934   Code Status:   Code Status: Full Code  Mobility Assessment (last 72 hours)     Mobility Assessment     Row Name 05/02/22 0930 04/30/22 1535 04/30/22 0735       Does patient have an order for bedrest or is patient medically unstable No - Continue assessment No - Continue assessment No - Continue assessment     What is the highest level of mobility based on the progressive mobility assessment? Level 6 (Walks independently in room and hall) - Balance while walking in room without assist - Complete Level 6 (Walks independently in room and hall) - Balance while walking in room without assist - Complete Level 6 (Walks independently in room and hall) - Balance while walking in room without assist - Complete              Barriers to discharge:  Disposition Plan:  Home Status is: Inpt  Objective: Blood pressure (!) 125/54, pulse 76, temperature 98.3 F (36.8 C), temperature source Oral, resp. rate 20, height 5\' 2"  (1.575 m), weight 93.6 kg, SpO2 100 %.  Examination:  Physical Exam Constitutional:      Appearance: Normal appearance.  HENT:     Head: Normocephalic and atraumatic.     Mouth/Throat:     Mouth: Mucous membranes are moist.  Eyes:     Extraocular Movements: Extraocular movements intact.  Cardiovascular:     Rate and Rhythm: Normal rate and regular rhythm.  Pulmonary:     Effort: Pulmonary effort is normal.     Breath sounds: Normal breath sounds.  Abdominal:     General: Bowel sounds are normal. There is no distension.     Palpations: Abdomen is soft.     Tenderness: There is no abdominal tenderness.     Comments: Right side noted with biliary drain in place  Musculoskeletal:        General: Normal range of motion.     Cervical back: Normal range of motion and neck supple.  Skin:    General: Skin is warm and dry.  Neurological:     General: No focal deficit present.     Mental Status: She is alert.   Psychiatric:        Mood and Affect: Mood normal.        Behavior: Behavior normal.      Consultants:  GI - signed off 4/2 IR General surgery - signed off 4/2  Procedures:  04/26/22: Successful fluoro guided placement of a right sided approach transhepatic 10 Fr biliary drainage catheter with end coiled and locked within the duodenum. Biliary drain connected to gravity bag.  Data Reviewed: Results for orders placed or performed during the hospital encounter of 04/17/22 (from the past 24 hour(s))  Comprehensive metabolic panel     Status: Abnormal   Collection Time: 05/02/22  5:00 AM  Result Value Ref Range   Sodium 132 (L) 135 - 145 mmol/L   Potassium 3.4 (L) 3.5 - 5.1 mmol/L   Chloride 106 98 - 111 mmol/L   CO2 20 (L) 22 - 32 mmol/L   Glucose, Bld 102 (H) 70 - 99 mg/dL   BUN 17 8 - 23 mg/dL   Creatinine, Ser 0.54 0.44 - 1.00 mg/dL   Calcium 7.7 (L) 8.9 -  10.3 mg/dL   Total Protein 5.2 (L) 6.5 - 8.1 g/dL   Albumin 1.8 (L) 3.5 - 5.0 g/dL   AST 48 (H) 15 - 41 U/L   ALT 43 0 - 44 U/L   Alkaline Phosphatase 134 (H) 38 - 126 U/L   Total Bilirubin 16.3 (H) 0.3 - 1.2 mg/dL   GFR, Estimated >60 >60 mL/min   Anion gap 6 5 - 15  CBC with Differential/Platelet     Status: Abnormal   Collection Time: 05/02/22  5:00 AM  Result Value Ref Range   WBC 6.5 4.0 - 10.5 K/uL   RBC 2.16 (L) 3.87 - 5.11 MIL/uL   Hemoglobin 7.6 (L) 12.0 - 15.0 g/dL   HCT 23.5 (L) 36.0 - 46.0 %   MCV 108.8 (H) 80.0 - 100.0 fL   MCH 35.2 (H) 26.0 - 34.0 pg   MCHC 32.3 30.0 - 36.0 g/dL   RDW 18.3 (H) 11.5 - 15.5 %   Platelets 85 (L) 150 - 400 K/uL   nRBC 0.0 0.0 - 0.2 %   Neutrophils Relative % 80 %   Neutro Abs 5.2 1.7 - 7.7 K/uL   Lymphocytes Relative 5 %   Lymphs Abs 0.3 (L) 0.7 - 4.0 K/uL   Monocytes Relative 13 %   Monocytes Absolute 0.9 0.1 - 1.0 K/uL   Eosinophils Relative 1 %   Eosinophils Absolute 0.1 0.0 - 0.5 K/uL   Basophils Relative 0 %   Basophils Absolute 0.0 0.0 - 0.1 K/uL   Immature  Granulocytes 1 %   Abs Immature Granulocytes 0.05 0.00 - 0.07 K/uL  Magnesium     Status: None   Collection Time: 05/02/22  5:00 AM  Result Value Ref Range   Magnesium 2.2 1.7 - 2.4 mg/dL     I have reviewed pertinent nursing notes, vitals, labs, and images as necessary. I have ordered labwork to follow up on as indicated.  I have reviewed the last notes from staff over past 24 hours. I have discussed patient's care plan and test results with nursing staff, CM/SW, and other staff as appropriate.  Time spent: Greater than 50% of the 55 minute visit was spent in counseling/coordination of care for the patient as laid out in the A&P.   LOS: 15 days   Dwyane Dee, MD [facility] 05/02/2022, 4:22 PM

## 2022-05-02 NOTE — Progress Notes (Signed)
Nutrition Follow-up  DOCUMENTATION CODES:   Obesity unspecified  INTERVENTION:   -D/c Boost Breeze -prefers Original Ensure from home  -RD to sign off at this time, please consult for further needs  NUTRITION DIAGNOSIS:   Increased nutrient needs related to chronic illness as evidenced by estimated needs.  Ongoing.  GOAL:   Patient will meet greater than or equal to 90% of their needs  Progressing.  MONITOR:   PO intake, Supplement acceptance, Diet advancement, Weight trends, Labs, I & O's  ASSESSMENT:   72 yo female with PMH rectal cancer with liver metastasis who presented with mild abdominal discomfort and outpatient elevated total bilirubin.  MRI liver was obtained on 04/14/2022 which showed underlying hypoenhancing central liver mass measuring 2.7 x 1.9 cm compatible with solitary recurrent liver metastasis.  Also showed underlying associated high-grade malignant biliary hilar stricture.  3/19: ERCP, s/p biliary stent placement  3/22: EGD 3/27: s/p biliary drain placement  Patient is currently consuming 100% of meals. Still having some pain but this is not affecting her appetite.  Pt not accepting Boost Breeze as she prefers Ensure from home.   Admission weight: 201 lbs Current weight: 206 lbs  Medications: Calcium carbonate, KLOR-CON  Labs reviewed: Low Na Low K  Diet Order:   Diet Order             Diet regular Fluid consistency: Thin  Diet effective now                   EDUCATION NEEDS:   No education needs have been identified at this time  Skin:  Skin Assessment: Skin Integrity Issues: Skin Integrity Issues:: Incisions Incisions: 3/22 abdomen  Last BM:  3/29  Height:   Ht Readings from Last 1 Encounters:  04/26/22 5\' 2"  (1.575 m)    Weight:   Wt Readings from Last 1 Encounters:  05/02/22 93.6 kg   BMI:  Body mass index is 37.74 kg/m.  Estimated Nutritional Needs:   Kcal:  1550-1750  Protein:  75-90g  Fluid:   1.7L/day  Clayton Bibles, MS, RD, LDN Inpatient Clinical Dietitian Contact information available via Amion

## 2022-05-02 NOTE — Progress Notes (Signed)
Referring Physician(s): Betsy Coder B  Supervising Physician: Mugweru,J  Patient Status:  [name] - In-pt  Chief Complaint: RUQ/epigastric pain, jaundice    Subjective: Pt remains jaundiced but t bili has now started to decrease slightly; she cont to have RUQ/epigastric pain, rated at 6/10; no fever/chills,N/V   Allergies: Epinephrine, Losartan, Latex, and Vectibix [panitumumab]  Medications: Prior to Admission medications   Medication Sig Start Date End Date Taking? Authorizing Provider  albuterol (PROVENTIL HFA) 108 (90 Base) MCG/ACT inhaler Inhale 2 puffs into the lungs every 6 (six) hours as needed for wheezing or shortness of breath. 08/03/20  Yes Owens Shark, NP  bisacodyl (DULCOLAX) 5 MG EC tablet Take 5 mg by mouth daily as needed for mild constipation or moderate constipation.   Yes [provider]  levothyroxine (SYNTHROID) 50 MCG tablet Take 1 tablet by mouth once daily Patient taking differently: Take 50 mcg by mouth daily before breakfast. 02/06/22  Yes Ladell Pier, MD  lidocaine-prilocaine (EMLA) cream Apply 1 Application topically as needed. Apply to port site 1-2 hours prior to use Patient taking differently: Apply 1 Application topically as needed (for port access- one to two hours prior to accessing). 12/12/21  Yes Owens Shark, NP  magic mouthwash SOLN Take 5 mLs by mouth 4 (four) times daily as needed for mouth pain. 5 ml swish and spit 4 times a daily as needed for mouth pain 09/14/20  Yes Ladell Pier, MD  magnesium oxide (MAG-OX) 400 (240 Mg) MG tablet Take 1 tablet (400 mg total) by mouth 2 (two) times daily. 02/22/22  Yes Ladell Pier, MD  Nutritional Supplements (ENSURE ORIGINAL) LIQD Take 237 mLs by mouth See admin instructions. Drink 237 ml's (1 CHOCOLATE shake) by mouth once a day   Yes [provider]  potassium chloride (MICRO-K) 10 MEQ CR capsule Take 10 mEq by mouth daily.   Yes [provider]  predniSONE  (DELTASONE) 5 MG tablet Take 1 tablet by mouth once daily with breakfast 03/15/22  Yes Ladell Pier, MD  prochlorperazine (COMPAZINE) 10 MG tablet Take 1 tablet (10 mg total) by mouth every 6 (six) hours as needed for nausea or vomiting. 03/01/21  Yes Owens Shark, NP  SYSTANE ULTRA PF 0.4-0.3 % SOLN Place 1 drop into both eyes 3 (three) times daily as needed (for dryness).   Yes [provider]  nystatin (MYCOSTATIN/NYSTOP) powder Apply 1 Application topically 3 (three) times daily. Patient not taking: Reported on 04/17/2022 12/12/21   Owens Shark, NP  Spacer/Aero-Holding Chambers (AEROCHAMBER MV) inhaler Use as instructed 12/31/19   Ladell Pier, MD  trifluridine-tipiracil (LONSURF) 20-8.19 MG tablet Take 2 tablets (40 mg Trifluridine) twice daily, take within 1 hr after AM & PM meals on days 1-5, 8-12. Repeat every 28 days. Start cycle on 04/10/22 Patient not taking: Reported on 04/17/2022 04/04/22   Ladell Pier, MD     Vital Signs: BP (!) 126/52 (BP Location: Right Arm)   Pulse 79   Temp 98.2 F (36.8 C) (Oral)   Resp 14   Ht 5\' 2"  (1.575 m)   Wt 206 lb 5.6 oz (93.6 kg)   LMP  (LMP Unknown)   SpO2 98%   BMI 37.74 kg/m   Physical Exam: awake/alert; biliary drain intact, insertion site with small amount drainage,area mild- mod tender to palpation, no erythema; OP 30+ cc bronze colored bile, about 50 cc in bag now; drain flushed without difficulty  Imaging:  US Abdomen Limited RUQ (LIVER/GB)  Result Date: 05/02/2022 CLINICAL DATA:  Cholelithiasis EXAM: ULTRASOUND ABDOMEN LIMITED RIGHT UPPER QUADRANT COMPARISON:  CT abdomen and pelvis dated 05/01/2022 FINDINGS: Gallbladder: Mild gallbladder mural thickening measures 4 mm. Sludge and stones within the gallbladder, predominantly lodged at the neck. No sonographic Murphy sign noted by sonographer. Partially imaged echogenic catheter near the hepatic hilum. Common bile duct: Diameter: 3 mm Liver: No focal lesion identified  in the right hepatic lobe. Left hepatic lobe is suboptimally evaluated due to overlying bowel gas. Portal vein is patent on color Doppler imaging with normal direction of blood flow towards the liver. Other: None. IMPRESSION: 1. Sludge and stones within the gallbladder, predominantly lodged at the neck. Mild gallbladder mural thickening measures 4 mm. No sonographic Murphy sign noted by sonographer. Findings are equivocal for acute cholecystitis. 2. No extrahepatic or intrahepatic right biliary ductal dilation. Left hepatic lobe is suboptimally evaluated due to overlying bowel gas. Electronically Signed   By: Darrin Nipper M.D.   On: 05/02/2022 08:25   CT ABDOMEN W CONTRAST  Result Date: 05/01/2022 CLINICAL DATA:  Biliary obstruction. Metastatic colon cancer. Stent placement. * Tracking Code: BO * EXAM: CT ABDOMEN WITH CONTRAST TECHNIQUE: Multidetector CT imaging of the abdomen was performed using the standard protocol following bolus administration of intravenous contrast. RADIATION DOSE REDUCTION: This exam was performed according to the departmental dose-optimization program which includes automated exposure control, adjustment of the mA and/or kV according to patient size and/or use of iterative reconstruction technique. CONTRAST:  172mL OMNIPAQUE IOHEXOL 300 MG/ML  SOLN COMPARISON:  04/25/2022 CT FINDINGS: Lower chest: The majority of the chest was scanned as part of the examination from the level of the aortic arch down through the diaphragm. Chest port is seen. Heart is nonenlarged. Scattered vascular calcifications. 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 Slightly patulous esophagus. Lung nodules are identified. Example left upper lobe measuring 15 by 11 mm on series 6, image 28. There are additional larger areas along the right lower lobe but partially obscured by the adjacent lung opacity and small right pleural effusion. Dedicated chest CT may be useful to further delineate these findings  in correlation to the patient's history of metastatic disease from colon cancer. Hepatobiliary: There has been placement of a right-sided indwelling PTC catheter with has distal pigtail extending into the second portion of the duodenal distally. There is a small subcapsular fluid collection just superior to the entrance site of the catheter on series 2, image 28 which is new from previous measuring 16 by 6 mm. There are areas of biliary ductal dilatation identified particularly left hepatic lobe. These are similar to slightly improved from the prior CT scan. No new areas of intrahepatic biliary ductal dilatation. Gallbladder once again is severely dilated with stones. There is also some wall thickening and edema today which is progressive from the prior examination. Central small liver mass identified in segment 4 on series 2, image 26 measuring 14 by 13 mm. Pancreas: Unremarkable. No pancreatic ductal dilatation or surrounding inflammatory changes. Spleen: Spleen is enlarged with a length of 13.9 cm. Previously 14.4 cm, similar. Adrenals/Urinary Tract: The adrenal glands are preserved. Kidneys are unremarkable. Stomach/Bowel: Visualized bowel is nondilated. Slight wall thickening along the duodenal, nonspecific. Surgical changes along the loops of bowel in the right lower quadrant. Vascular/Lymphatic: Normal caliber aorta and IVC with some scattered vascular calcifications. There is a prominent lymph node along the portacaval region measuring 16 by 10 mm, mildly enlarged. In retrospect  unchanged from prior. Few other adjacent small prominent nodes identified as well. Other: Mild free fluid.  No free intra-air. Musculoskeletal: Degenerative changes seen along the spine. IMPRESSION: Indwelling right-sided PTC catheter. The level of the intrahepatic biliary duct dilatation is similar to slightly decreased. New small subcapsular fluid collection along the liver just superior to the entrance site of the PTC catheter.  Dilated gallbladder with stones. There is progressive wall thickening and wall edema. Please correlate with any clinical findings of acute cholecystitis. If needed dedicated workup such as HIDA scan could be considered Stable appearance of the intrinsic liver mass segment 4 as well as mildly enlarged porta hepatic lymph node. 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 Trace ascites. Lung masses identified. Please correlate for history of metastatic disease to the chest. If needed additional chest CT dedicated workup can be performed when clinically appropriate. Persistent small right pleural effusion. Electronically Signed   By: Jill Side M.D.   On: 05/01/2022 11:05    Labs:  CBC: Recent Labs    04/29/22 0611 04/30/22 0500 05/01/22 0500 05/02/22 0500  WBC 8.1 11.8* 15.5* 6.5  HGB 8.9* 9.5* 8.8* 7.6*  HCT 27.6* 29.1* 26.9* 23.5*  PLT 116* 126* 108* 85*    COAGS: Recent Labs    04/17/22 0841 04/24/22 0500 04/30/22 0500  INR 1.1 1.2 1.4*    BMP: Recent Labs    04/29/22 0611 04/30/22 0500 05/01/22 0500 05/02/22 0500  NA 133* 131* 131* 132*  K 4.0 3.8 3.9 3.4*  CL 99 98 101 106  CO2 25 24 23  20*  GLUCOSE 112* 126* 120* 102*  BUN 14 15 13 17   CALCIUM 8.1* 8.1* 7.8* 7.7*  CREATININE 0.55 0.59 0.53 0.54  GFRNONAA >60 >60 >60 >60    LIVER FUNCTION TESTS: Recent Labs    04/29/22 0611 04/30/22 0500 05/01/22 0500 05/02/22 0500  BILITOT 18.2* 20.4* 19.6* 16.3*  AST 74* 71* 61* 48*  ALT 53* 55* 49* 43  ALKPHOS 207* 187* 149* 134*  PROT 5.6* 6.0* 5.4* 5.2*  ALBUMIN 2.2* 2.3* 2.0* 1.8*   Assessment and Plan: 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, biliary obstruction; s/p placement of right ext biliary drain 04/21/22 with subsequent removal and placement of new I/E rt biliary drain 04/26/22; the left intrahepatic biliary system was unable to be traversed percutaneously from the right-sided approach secondary to malignant  obstruction of the level of the hilum; afebrile,WBC nl, hgb 7.6(8.8), plts 85k(108k), K 3.4, creat nl, t bili 16.3(19.6); abd [place] today-   1. Sludge and stones within the gallbladder, predominantly lodged at the neck. Mild gallbladder mural thickening measures 4 mm. No sonographic Murphy sign noted by sonographer. Findings are equivocal for acute cholecystitis. 2. No extrahepatic or intrahepatic right biliary ductal dilation. Left hepatic lobe is suboptimally evaluated due to overlying bowel gas.  Case reviewed by Dr. Maryelizabeth Kaufmann- cont current tx for now, monitor labs; if pt develops intractable RUQ pain/WBC increases, fever develops may need to consider perc chole; pt not candidate for lap chole per [facility]    Electronically Signed: D. Rowe Robert, PA-C 05/02/2022, 2:12 PM   I spent a total of 15 Minutes at the the patient's bedside AND on the patient's hospital floor or unit, greater than 50% of which was counseling/coordinating care for biliary drain    Patient ID: Samantha Lutz, female   DOB: 11-Jun-1950, 72 y.o.   MRN: [id number]

## 2022-05-02 NOTE — Progress Notes (Signed)
6 Days Post-Op   Subjective/Chief Complaint: She is not having significant RUQ pain. Some when she moves but that's is currently.    Objective: Vital signs in last 24 hours: Temp:  [98.2 F (36.8 C)-98.7 F (37.1 C)] 98.2 F (36.8 C) (04/02 0448) Pulse Rate:  [76-79] 79 (04/02 0448) Resp:  [14-20] 14 (04/02 0448) BP: (115-126)/(44-53) 126/52 (04/02 0448) SpO2:  [98 %-99 %] 98 % (04/02 0448) Weight:  [93.6 kg] 93.6 kg (04/02 0448) Last BM Date : 04/28/22  Intake/Output from previous day: 04/01 0701 - 04/02 0700 In: 2209 [P.O.:480; I.V.:1424; IV Piggyback:300] Out: 255 [Urine:225; Drains:30] Intake/Output this shift: No intake/output data recorded.  Alert, calm, cooperative, jaundiced. Unlabored respirations LUQ drain with dark bilious output Abdomen is nontender currently  Lab Results:  Recent Labs    05/01/22 0500 05/02/22 0500  WBC 15.5* 6.5  HGB 8.8* 7.6*  HCT 26.9* 23.5*  PLT 108* 85*   BMET Recent Labs    05/01/22 0500 05/02/22 0500  NA 131* 132*  K 3.9 3.4*  CL 101 106  CO2 23 20*  GLUCOSE 120* 102*  BUN 13 17  CREATININE 0.53 0.54  CALCIUM 7.8* 7.7*   PT/INR Recent Labs    04/30/22 0500  LABPROT 16.8*  INR 1.4*   ABG No results for input(s): "PHART", "HCO3" in the last 72 hours.  Invalid input(s): "PCO2", "PO2"  Studies/Results: US Abdomen Limited RUQ (LIVER/GB)  Result Date: 05/02/2022 CLINICAL DATA:  Cholelithiasis EXAM: ULTRASOUND ABDOMEN LIMITED RIGHT UPPER QUADRANT COMPARISON:  CT abdomen and pelvis dated 05/01/2022 FINDINGS: Gallbladder: Mild gallbladder mural thickening measures 4 mm. Sludge and stones within the gallbladder, predominantly lodged at the neck. No sonographic Murphy sign noted by sonographer. Partially imaged echogenic catheter near the hepatic hilum. Common bile duct: Diameter: 3 mm Liver: No focal lesion identified in the right hepatic lobe. Left hepatic lobe is suboptimally evaluated due to overlying bowel gas. Portal  vein is patent on color Doppler imaging with normal direction of blood flow towards the liver. Other: None. IMPRESSION: 1. Sludge and stones within the gallbladder, predominantly lodged at the neck. Mild gallbladder mural thickening measures 4 mm. No sonographic Murphy sign noted by sonographer. Findings are equivocal for acute cholecystitis. 2. No extrahepatic or intrahepatic right biliary ductal dilation. Left hepatic lobe is suboptimally evaluated due to overlying bowel gas. Electronically Signed   By: Darrin Nipper M.D.   On: 05/02/2022 08:25   CT ABDOMEN W CONTRAST  Result Date: 05/01/2022 CLINICAL DATA:  Biliary obstruction. Metastatic colon cancer. Stent placement. * Tracking Code: BO * EXAM: CT ABDOMEN WITH CONTRAST TECHNIQUE: Multidetector CT imaging of the abdomen was performed using the standard protocol following bolus administration of intravenous contrast. RADIATION DOSE REDUCTION: This exam was performed according to the departmental dose-optimization program which includes automated exposure control, adjustment of the mA and/or kV according to patient size and/or use of iterative reconstruction technique. CONTRAST:  142mL OMNIPAQUE IOHEXOL 300 MG/ML  SOLN COMPARISON:  04/25/2022 CT FINDINGS: Lower chest: The majority of the chest was scanned as part of the examination from the level of the aortic arch down through the diaphragm. Chest port is seen. Heart is nonenlarged. Scattered vascular calcifications. 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 Slightly patulous esophagus. Lung nodules are identified. Example left upper lobe measuring 15 by 11 mm on series 6, image 28. There are additional larger areas along the right lower lobe but partially obscured by the adjacent lung opacity and small right pleural  effusion. Dedicated chest CT may be useful to further delineate these findings in correlation to the patient's history of metastatic disease from colon cancer. Hepatobiliary: There has  been placement of a right-sided indwelling PTC catheter with has distal pigtail extending into the second portion of the duodenal distally. There is a small subcapsular fluid collection just superior to the entrance site of the catheter on series 2, image 28 which is new from previous measuring 16 by 6 mm. There are areas of biliary ductal dilatation identified particularly left hepatic lobe. These are similar to slightly improved from the prior CT scan. No new areas of intrahepatic biliary ductal dilatation. Gallbladder once again is severely dilated with stones. There is also some wall thickening and edema today which is progressive from the prior examination. Central small liver mass identified in segment 4 on series 2, image 26 measuring 14 by 13 mm. Pancreas: Unremarkable. No pancreatic ductal dilatation or surrounding inflammatory changes. Spleen: Spleen is enlarged with a length of 13.9 cm. Previously 14.4 cm, similar. Adrenals/Urinary Tract: The adrenal glands are preserved. Kidneys are unremarkable. Stomach/Bowel: Visualized bowel is nondilated. Slight wall thickening along the duodenal, nonspecific. Surgical changes along the loops of bowel in the right lower quadrant. Vascular/Lymphatic: Normal caliber aorta and IVC with some scattered vascular calcifications. There is a prominent lymph node along the portacaval region measuring 16 by 10 mm, mildly enlarged. In retrospect unchanged from prior. Few other adjacent small prominent nodes identified as well. Other: Mild free fluid.  No free intra-air. Musculoskeletal: Degenerative changes seen along the spine. IMPRESSION: Indwelling right-sided PTC catheter. The level of the intrahepatic biliary duct dilatation is similar to slightly decreased. New small subcapsular fluid collection along the liver just superior to the entrance site of the PTC catheter. Dilated gallbladder with stones. There is progressive wall thickening and wall edema. Please correlate with  any clinical findings of acute cholecystitis. If needed dedicated workup such as HIDA scan could be considered Stable appearance of the intrinsic liver mass segment 4 as well as mildly enlarged porta hepatic lymph node. 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 Trace ascites. Lung masses identified. Please correlate for history of metastatic disease to the chest. If needed additional chest CT dedicated workup can be performed when clinically appropriate. Persistent small right pleural effusion. Electronically Signed   By: Jill Side M.D.   On: 05/01/2022 11:05    Anti-infectives: Anti-infectives (From admission, onward)    Start     Dose/Rate Route Frequency Ordered Stop   04/30/22 0900  ceFEPIme (MAXIPIME) 2 g in sodium chloride 0.9 % 100 mL IVPB        2 g 200 mL/hr over 30 Minutes Intravenous Every 8 hours 04/30/22 0803     04/26/22 0730  cefOXitin (MEFOXIN) 2 g in sodium chloride 0.9 % 100 mL IVPB        2 g 200 mL/hr over 30 Minutes Intravenous On call 04/25/22 1555 04/27/22 0730   04/18/22 1230  ampicillin-sulbactam (UNASYN) 1.5 g in sodium chloride 0.9 % 100 mL IVPB  Status:  Discontinued        1.5 g 200 mL/hr over 30 Minutes Intravenous  Once 04/18/22 1227 04/18/22 1531   04/18/22 1100  ampicillin-sulbactam (UNASYN) 1.5 g in sodium chloride 0.9 % 100 mL IVPB  Status:  Discontinued        1.5 g 200 mL/hr over 30 Minutes Intravenous Every 6 hours 04/18/22 0958 04/25/22 [id number]  Assessment/Plan: 72 y.o. female with metastatic rectal cancer and obstructive jaundice due to central liver mass, s/p internal/external biliary drain. Right system is drained by left could not be accessed per IR.  Clinically does not seem to have cholecysitis though CT shows dilated gb with stones and wall edema. [place] equivocal.   At this time would recommend against intervention regarding her gallbladder. If she does develop intractable RUQ pain and  cholecystitis is an issue, would recommend palliative perc chole tube as she is not a candidate for lap chole, based largely on anatomic concerns from the malignancy but also given liver function concerns. .   I discussed all this with her and she seems to understand. Surgery team will sign off, please call if needed.    LOS: 15 days    Clovis Riley 05/02/2022

## 2022-05-02 NOTE — Progress Notes (Addendum)
Daily Progress Note  DOA: 04/17/2022 Hospital Day: 23  Chief Complaint: biliary obstruction in setting of metastatic rectal cancer    ASSESSMENT   72 yo female with the following:    Rectal cancer  ypT3ypN1a  s/p neoadj chemoXRT, robotic LAR resection & diverting loop ileostomy 12/21/2017.    Obstructive jaundice 2/2 to metastatic disease / biliary stricture.  S/p  Internal / external drain.   The left intrahepatic biliary system was unable to be traversed percutaneously from the right-sided approach secondary to malignant obstruction but the right system is drained / decompressed. Not candidate for additional drains per IR. On 4/1 her t bili was up to 19 / WBC 15K.   CT scan showed indwelling right-sided PTC catheter with similar or decreased intrahepatic biliary duct dilatation , new small subcapsular fluid collection along the liver just superior to the entrance site of the PTC catheter.   Liver chemistries improved overnight. Tbili down to 16.  Alk phos and liver enzymes continues to slowly improve. WBC has normalized on Maxipime started 3/31 for + biliary culture (enterobacter cloacae).   ? Cholecystitis.   -CT scan shows splenomegaly , dilated gallbladder with stones and progressive wall thickening and wall edema.  Appreciate Surgery's input. [place] is equivocal for acute cholecystitis.  She isn't having significant RUQ pain and non tender on exam  Macrocytic anemia.  Hgb 7.6.   PLAN   Awaiting Surgery's opinion about presence of cholecystitis.    Attending Physician Note   I have taken an interval history, reviewed the chart and examined the patient. I performed a substantive portion of this encounter, including complete performance of at least one of the key components, in conjunction with the APP. I agree with the APP's note, impression and recommendations with my edits. My additional impressions and recommendations are as follows.   RUQ pain resolving. Abdominal  tenderness at drain site. WBC has normalized.   *Obstructive jaundice at hepatic duct level from metastatic rectal cancer with PBD in place. T bili has decreased to 16.3. Enterobacter cloacae biliary infection on Maxipime. Infection is a potential cause of her increased her t bili. Complete course of antibiotics. IR following. No additional GI recommendations.   *Cholelithiasis, possible cholecystitis. Complete course of antibiotics. If significant RUQ pain / tenderness recurs with imaging evidence of cholecystitis surgery recommends a perc chole tube.  *Metastatic rectal cancer, mgmt per oncology.   *Macrocytic anemia, per primary service   GI signing off. Outpatient GI follow up with Dr. Loletha Carrow as needed.   Lucio Edward, MD [facility] See [facility], [facility], for our on call provider     Subjective / New events:   Sleepy. No significant RUQ pain.     Imaging:  RUQ Ultrasound 1. Sludge and stones within the gallbladder, predominantly lodged at the neck. Mild gallbladder mural thickening measures 4 mm. No sonographic Murphy sign noted by sonographer. Findings are equivocal for acute cholecystitis. 2. No extrahepatic or intrahepatic right biliary ductal dilation. Left hepatic lobe is suboptimally evaluated due to overlying bowel gas.    Lab Results: Recent Labs    04/30/22 0500 05/01/22 0500 05/02/22 0500  WBC 11.8* 15.5* 6.5  HGB 9.5* 8.8* 7.6*  HCT 29.1* 26.9* 23.5*  PLT 126* 108* 85*   BMET Recent Labs    04/30/22 0500 05/01/22 0500 05/02/22 0500  NA 131* 131* 132*  K 3.8 3.9 3.4*  CL 98 101 106  CO2 24 23 20*  GLUCOSE 126* 120* 102*  BUN 15 13 17   CREATININE 0.59 0.53 0.54  CALCIUM 8.1* 7.8* 7.7*   LFT Recent Labs    05/02/22 0500  PROT 5.2*  ALBUMIN 1.8*  AST 48*  ALT 43  ALKPHOS 134*  BILITOT 16.3*   PT/INR Recent Labs    04/30/22 0500  LABPROT 16.8*  INR 1.4*     Scheduled inpatient medications:   calcium carbonate  2 tablet Oral TID WC    Chlorhexidine Gluconate Cloth  6 each Topical Daily   enoxaparin (LOVENOX) injection  40 mg Subcutaneous Q24H   feeding supplement  1 Container Oral TID BM   levothyroxine  50 mcg Oral Daily   pantoprazole  40 mg Oral Daily   potassium chloride  40 mEq Oral Once   predniSONE  5 mg Oral Q breakfast   sodium chloride flush  5 mL Intracatheter Q8H   sodium chloride flush  5 mL Intracatheter Q8H   Continuous inpatient infusions:   sodium chloride 75 mL/hr at 05/02/22 0452   ceFEPime (MAXIPIME) IV 2 g (05/02/22 0003)   PRN inpatient medications: acetaminophen **OR** acetaminophen, alum & mag hydroxide-simeth, bisacodyl, cyclobenzaprine, HYDROmorphone (DILAUDID) injection, ondansetron **OR** ondansetron (ZOFRAN) IV, oxyCODONE, traZODone  Vital signs in last 24 hours: Temp:  [98.2 F (36.8 C)-98.7 F (37.1 C)] 98.2 F (36.8 C) (04/02 0448) Pulse Rate:  [76-79] 79 (04/02 0448) Resp:  [14-20] 14 (04/02 0448) BP: (115-126)/(44-53) 126/52 (04/02 0448) SpO2:  [98 %-99 %] 98 % (04/02 0448) Weight:  [93.6 kg] 93.6 kg (04/02 0448) Last BM Date : 04/28/22  Intake/Output Summary (Last 24 hours) at 05/02/2022 0845 Last data filed at 05/02/2022 0458 Gross per 24 hour  Intake 2209 ml  Output 255 ml  Net 1954 ml    Intake/Output from previous day: 04/01 0701 - 04/02 0700 In: 2209 [P.O.:480; I.V.:1424; IV Piggyback:300] Out: 255 [Urine:225; Drains:30] Intake/Output this shift: No intake/output data recorded.   Physical Exam:  General: Alert female in NAD Heart:  Regular rate and rhythm.  Pulmonary: Normal respiratory effort Abdomen: Soft, nondistended, nontender. Normal bowel sounds. Approx 100 ml bilious output in drain Neurologic: Alert and oriented Psych: Pleasant. Cooperative. Insight appears normal.    Principal Problem:   Biliary obstruction Active Problems:   Hypertension   Hypothyroid   Rectal cancer ypT3ypN1a (1/23 LN) s/p neoadj chemoXRT, robotic LAR resection & diverting  loop ileostomy 12/21/2017   Disorder of bile duct stent     LOS: 15 days   Samantha Lutz ,NP 05/02/2022, 8:45 AM

## 2022-05-03 DIAGNOSIS — K831 Obstruction of bile duct: Secondary | ICD-10-CM | POA: Diagnosis not present

## 2022-05-03 LAB — COMPREHENSIVE METABOLIC PANEL
ALT: 40 U/L (ref 0–44)
AST: 56 U/L — ABNORMAL HIGH (ref 15–41)
Albumin: 1.7 g/dL — ABNORMAL LOW (ref 3.5–5.0)
Alkaline Phosphatase: 112 U/L (ref 38–126)
Anion gap: 5 (ref 5–15)
BUN: 14 mg/dL (ref 8–23)
CO2: 20 mmol/L — ABNORMAL LOW (ref 22–32)
Calcium: 7.6 mg/dL — ABNORMAL LOW (ref 8.9–10.3)
Chloride: 111 mmol/L (ref 98–111)
Creatinine, Ser: 0.58 mg/dL (ref 0.44–1.00)
GFR, Estimated: >60 mL/min (ref >60)
Glucose, Bld: 88 mg/dL (ref 70–99)
Potassium: 3.5 mmol/L (ref 3.5–5.1)
Sodium: 136 mmol/L (ref 135–145)
Total Bilirubin: 13.4 mg/dL — ABNORMAL HIGH (ref 0.3–1.2)
Total Protein: 4.9 g/dL — ABNORMAL LOW (ref 6.5–8.1)

## 2022-05-03 LAB — CBC WITH DIFFERENTIAL/PLATELET
Abs Immature Granulocytes: 0.04 10*3/uL (ref 0.00–0.07)
Abs Immature Granulocytes: 0.05 10*3/uL (ref 0.00–0.07)
Basophils Absolute: 0 10*3/uL (ref 0.0–0.1)
Basophils Absolute: 0 10*3/uL (ref 0.0–0.1)
Basophils Relative: 0 %
Basophils Relative: 0 %
Eosinophils Absolute: 0 10*3/uL (ref 0.0–0.5)
Eosinophils Absolute: 0 10*3/uL (ref 0.0–0.5)
Eosinophils Relative: 1 %
Eosinophils Relative: 1 %
HCT: 22.9 % — ABNORMAL LOW (ref 36.0–46.0)
HCT: 24.4 % — ABNORMAL LOW (ref 36.0–46.0)
Hemoglobin: 7.2 g/dL — ABNORMAL LOW (ref 12.0–15.0)
Hemoglobin: 7.8 g/dL — ABNORMAL LOW (ref 12.0–15.0)
Immature Granulocytes: 1 %
Immature Granulocytes: 1 %
Lymphocytes Relative: 9 %
Lymphocytes Relative: 5 %
Lymphs Abs: 0.3 10*3/uL — ABNORMAL LOW (ref 0.7–4.0)
Lymphs Abs: 0.2 10*3/uL — ABNORMAL LOW (ref 0.7–4.0)
MCH: 35 pg — ABNORMAL HIGH (ref 26.0–34.0)
MCH: 35 pg — ABNORMAL HIGH (ref 26.0–34.0)
MCHC: 31.4 g/dL (ref 30.0–36.0)
MCHC: 32 g/dL (ref 30.0–36.0)
MCV: 111.2 fL — ABNORMAL HIGH (ref 80.0–100.0)
MCV: 109.4 fL — ABNORMAL HIGH (ref 80.0–100.0)
Monocytes Absolute: 0.5 10*3/uL (ref 0.1–1.0)
Monocytes Absolute: 0.5 10*3/uL (ref 0.1–1.0)
Monocytes Relative: 14 %
Monocytes Relative: 12 %
Neutro Abs: 2.8 10*3/uL (ref 1.7–7.7)
Neutro Abs: 3.2 10*3/uL (ref 1.7–7.7)
Neutrophils Relative %: 75 %
Neutrophils Relative %: 81 %
Platelets: 92 10*3/uL — ABNORMAL LOW (ref 150–400)
Platelets: 113 10*3/uL — ABNORMAL LOW (ref 150–400)
RBC: 2.06 MIL/uL — ABNORMAL LOW (ref 3.87–5.11)
RBC: 2.23 MIL/uL — ABNORMAL LOW (ref 3.87–5.11)
RDW: 17.7 % — ABNORMAL HIGH (ref 11.5–15.5)
RDW: 17.6 % — ABNORMAL HIGH (ref 11.5–15.5)
WBC: 3.7 10*3/uL — ABNORMAL LOW (ref 4.0–10.5)
WBC: 3.9 10*3/uL — ABNORMAL LOW (ref 4.0–10.5)
nRBC: 0 % (ref 0.0–0.2)
nRBC: 0 % (ref 0.0–0.2)

## 2022-05-03 LAB — FOLATE: Folate: 10.9 ng/mL (ref >5.9)

## 2022-05-03 LAB — VITAMIN B12: Vitamin B-12: 342 pg/mL (ref 180–914)

## 2022-05-03 LAB — MAGNESIUM: Magnesium: 2.1 mg/dL (ref 1.7–2.4)

## 2022-05-03 MED ORDER — SIMETHICONE 80 MG PO CHEW
80.0000 mg | CHEWABLE_TABLET | Freq: Four times a day (QID) | ORAL | Status: DC | PRN
  Administered 2022-05-03: 80 mg via ORAL
  Filled 2022-05-03: qty 1

## 2022-05-03 NOTE — Progress Notes (Signed)
Referring Physician(s): Ladell Pier  Supervising Physician: Arne Cleveland  Patient Status:  [facility] - In-pt  Chief Complaint: RUQ/epigastric pain, jaundice    Subjective: Pt without new c/o; denies fever,N/V; has less abd discomfort, mainly exacerbated with movement   Allergies: Epinephrine, Losartan, Latex, and Vectibix [panitumumab]  Medications: Prior to Admission medications   Medication Sig Start Date End Date Taking? Authorizing Provider  albuterol (PROVENTIL HFA) 108 (90 Base) MCG/ACT inhaler Inhale 2 puffs into the lungs every 6 (six) hours as needed for wheezing or shortness of breath. 08/03/20  Yes Owens Shark, NP  bisacodyl (DULCOLAX) 5 MG EC tablet Take 5 mg by mouth daily as needed for mild constipation or moderate constipation.   Yes [provider]  levothyroxine (SYNTHROID) 50 MCG tablet Take 1 tablet by mouth once daily Patient taking differently: Take 50 mcg by mouth daily before breakfast. 02/06/22  Yes Ladell Pier, MD  lidocaine-prilocaine (EMLA) cream Apply 1 Application topically as needed. Apply to port site 1-2 hours prior to use Patient taking differently: Apply 1 Application topically as needed (for port access- one to two hours prior to accessing). 12/12/21  Yes Owens Shark, NP  magic mouthwash SOLN Take 5 mLs by mouth 4 (four) times daily as needed for mouth pain. 5 ml swish and spit 4 times a daily as needed for mouth pain 09/14/20  Yes Ladell Pier, MD  magnesium oxide (MAG-OX) 400 (240 Mg) MG tablet Take 1 tablet (400 mg total) by mouth 2 (two) times daily. 02/22/22  Yes Ladell Pier, MD  Nutritional Supplements (ENSURE ORIGINAL) LIQD Take 237 mLs by mouth See admin instructions. Drink 237 ml's (1 CHOCOLATE shake) by mouth once a day   Yes [provider]  potassium chloride (MICRO-K) 10 MEQ CR capsule Take 10 mEq by mouth daily.   Yes [provider]  predniSONE (DELTASONE) 5 MG tablet Take 1 tablet by  mouth once daily with breakfast 03/15/22  Yes Ladell Pier, MD  prochlorperazine (COMPAZINE) 10 MG tablet Take 1 tablet (10 mg total) by mouth every 6 (six) hours as needed for nausea or vomiting. 03/01/21  Yes Owens Shark, NP  SYSTANE ULTRA PF 0.4-0.3 % SOLN Place 1 drop into both eyes 3 (three) times daily as needed (for dryness).   Yes [provider]  nystatin (MYCOSTATIN/NYSTOP) powder Apply 1 Application topically 3 (three) times daily. Patient not taking: Reported on 04/17/2022 12/12/21   Owens Shark, NP  Spacer/Aero-Holding Chambers (AEROCHAMBER MV) inhaler Use as instructed 12/31/19   Ladell Pier, MD  trifluridine-tipiracil (LONSURF) 20-8.19 MG tablet Take 2 tablets (40 mg Trifluridine) twice daily, take within 1 hr after AM & PM meals on days 1-5, 8-12. Repeat every 28 days. Start cycle on 04/10/22 Patient not taking: Reported on 04/17/2022 04/04/22   Ladell Pier, MD     Vital Signs: BP 117/63 (BP Location: Left Arm)   Pulse 73   Temp 98.2 F (36.8 C) (Oral)   Resp 17   Ht 5\' 2"  (1.575 m)   Wt 209 lb 3.5 oz (94.9 kg)   LMP  (LMP Unknown)   SpO2 98%   BMI 38.27 kg/m   Physical Exam : awake/alert; biliary drain intact, insertion site ok, area mildly tender to palpation, no erythema; OP 350 cc bronze colored bile yesterday, about 40 cc today ,drain flushed without difficulty   Imaging: US Abdomen Limited RUQ (LIVER/GB)  Result Date: 05/02/2022 CLINICAL DATA:  Cholelithiasis EXAM: ULTRASOUND ABDOMEN LIMITED RIGHT UPPER QUADRANT COMPARISON:  CT abdomen and pelvis dated 05/01/2022 FINDINGS: Gallbladder: Mild gallbladder mural thickening measures 4 mm. Sludge and stones within the gallbladder, predominantly lodged at the neck. No sonographic Murphy sign noted by sonographer. Partially imaged echogenic catheter near the hepatic hilum. Common bile duct: Diameter: 3 mm Liver: No focal lesion identified in the right hepatic lobe. Left hepatic lobe is suboptimally  evaluated due to overlying bowel gas. Portal vein is patent on color Doppler imaging with normal direction of blood flow towards the liver. Other: None. IMPRESSION: 1. Sludge and stones within the gallbladder, predominantly lodged at the neck. Mild gallbladder mural thickening measures 4 mm. No sonographic Murphy sign noted by sonographer. Findings are equivocal for acute cholecystitis. 2. No extrahepatic or intrahepatic right biliary ductal dilation. Left hepatic lobe is suboptimally evaluated due to overlying bowel gas. Electronically Signed   By: Darrin Nipper M.D.   On: 05/02/2022 08:25   CT ABDOMEN W CONTRAST  Result Date: 05/01/2022 CLINICAL DATA:  Biliary obstruction. Metastatic colon cancer. Stent placement. * Tracking Code: BO * EXAM: CT ABDOMEN WITH CONTRAST TECHNIQUE: Multidetector CT imaging of the abdomen was performed using the standard protocol following bolus administration of intravenous contrast. RADIATION DOSE REDUCTION: This exam was performed according to the departmental dose-optimization program which includes automated exposure control, adjustment of the mA and/or kV according to patient size and/or use of iterative reconstruction technique. CONTRAST:  122mL OMNIPAQUE IOHEXOL 300 MG/ML  SOLN COMPARISON:  04/25/2022 CT FINDINGS: Lower chest: The majority of the chest was scanned as part of the examination from the level of the aortic arch down through the diaphragm. Chest port is seen. Heart is nonenlarged. Scattered vascular calcifications. 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 Slightly patulous esophagus. Lung nodules are identified. Example left upper lobe measuring 15 by 11 mm on series 6, image 28. There are additional larger areas along the right lower lobe but partially obscured by the adjacent lung opacity and small right pleural effusion. Dedicated chest CT may be useful to further delineate these findings in correlation to the patient's history of metastatic disease  from colon cancer. Hepatobiliary: There has been placement of a right-sided indwelling PTC catheter with has distal pigtail extending into the second portion of the duodenal distally. There is a small subcapsular fluid collection just superior to the entrance site of the catheter on series 2, image 28 which is new from previous measuring 16 by 6 mm. There are areas of biliary ductal dilatation identified particularly left hepatic lobe. These are similar to slightly improved from the prior CT scan. No new areas of intrahepatic biliary ductal dilatation. Gallbladder once again is severely dilated with stones. There is also some wall thickening and edema today which is progressive from the prior examination. Central small liver mass identified in segment 4 on series 2, image 26 measuring 14 by 13 mm. Pancreas: Unremarkable. No pancreatic ductal dilatation or surrounding inflammatory changes. Spleen: Spleen is enlarged with a length of 13.9 cm. Previously 14.4 cm, similar. Adrenals/Urinary Tract: The adrenal glands are preserved. Kidneys are unremarkable. Stomach/Bowel: Visualized bowel is nondilated. Slight wall thickening along the duodenal, nonspecific. Surgical changes along the loops of bowel in the right lower quadrant. Vascular/Lymphatic: Normal caliber aorta and IVC with some scattered vascular calcifications. There is a prominent lymph node along the portacaval region measuring 16 by 10 mm, mildly enlarged. In retrospect unchanged from prior. Few other adjacent small prominent nodes identified as well.  Other: Mild free fluid.  No free intra-air. Musculoskeletal: Degenerative changes seen along the spine. IMPRESSION: Indwelling right-sided PTC catheter. The level of the intrahepatic biliary duct dilatation is similar to slightly decreased. New small subcapsular fluid collection along the liver just superior to the entrance site of the PTC catheter. Dilated gallbladder with stones. There is progressive wall  thickening and wall edema. Please correlate with any clinical findings of acute cholecystitis. If needed dedicated workup such as HIDA scan could be considered Stable appearance of the intrinsic liver mass segment 4 as well as mildly enlarged porta hepatic lymph node. 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 Trace ascites. Lung masses identified. Please correlate for history of metastatic disease to the chest. If needed additional chest CT dedicated workup can be performed when clinically appropriate. Persistent small right pleural effusion. Electronically Signed   By: Jill Side M.D.   On: 05/01/2022 11:05    Labs:  CBC: Recent Labs    04/30/22 0500 05/01/22 0500 05/02/22 0500 05/03/22 0701  WBC 11.8* 15.5* 6.5 3.7*  HGB 9.5* 8.8* 7.6* 7.2*  HCT 29.1* 26.9* 23.5* 22.9*  PLT 126* 108* 85* 92*    COAGS: Recent Labs    04/17/22 0841 04/24/22 0500 04/30/22 0500  INR 1.1 1.2 1.4*    BMP: Recent Labs    04/30/22 0500 05/01/22 0500 05/02/22 0500 05/03/22 0701  NA 131* 131* 132* 136  K 3.8 3.9 3.4* 3.5  CL 98 101 106 111  CO2 24 23 20* 20*  GLUCOSE 126* 120* 102* 88  BUN 15 13 17 14   CALCIUM 8.1* 7.8* 7.7* 7.6*  CREATININE 0.59 0.53 0.54 0.58  GFRNONAA >60 >60 >60 >60    LIVER FUNCTION TESTS: Recent Labs    04/30/22 0500 05/01/22 0500 05/02/22 0500 05/03/22 0701  BILITOT 20.4* 19.6* 16.3* 13.4*  AST 71* 61* 48* 56*  ALT 55* 49* 43 40  ALKPHOS 187* 149* 134* 112  PROT 6.0* 5.4* 5.2* 4.9*  ALBUMIN 2.3* 2.0* 1.8* 1.7*    Assessment and Plan: 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, biliary obstruction, mild GB wall thickening/stones; s/p placement of right ext biliary drain 04/21/22 with subsequent removal and placement of new I/E rt biliary drain 04/26/22; the left intrahepatic biliary system was unable to be traversed percutaneously from the right-sided approach secondary to malignant obstruction of the level of the  hilum; afebrile , WBC 3.7, hgb 7.2(7.6), creat nl, t bili 13.4(16.3); cont current tx, monitor labs closely; if pt develops intractable RUQ pain/WBC increases, fever develops may need to consider perc chole; pt not candidate for lap chole per [facility]    Electronically Signed: D. Rowe Robert, PA-C 05/03/2022, 4:13 PM   I spent a total of 15 minutes at the the patient's bedside AND on the patient's hospital floor or unit, greater than 50% of which was counseling/coordinating care for biliary drain    Patient ID: Samantha Lutz, female   DOB: 1950-11-17, 72 y.o.   MRN: [id number]

## 2022-05-03 NOTE — Progress Notes (Signed)
PROGRESS NOTE    Samantha Lutz  [id number] DOB: July 10, 1950 DOA: 04/17/2022 PCP: Andre Lefort, FNP   Brief Narrative:  [name] is a 72 yo female with PMH rectal cancer with liver metastasis who presented with mild abdominal discomfort and outpatient elevated total bilirubin.  This has been trended and found to not be resolving.  MRI liver was obtained on 04/14/2022 which showed underlying hypoenhancing central liver mass measuring 2.7 x 1.9 cm compatible with solitary recurrent liver metastasis.  Also showed underlying associated high-grade malignant biliary hilar stricture.  She was admitted for GI evaluation and expedited endoscopy for further evaluation.   She underwent ERCP and stent placement 3/19. Hospitalization complicated by post ERCP pancreatitis and rising LFT's. GI removed biliary stent on 3/22 and IR was consulted for external biliary drain. She underwent fluoro guided right sided transhepatic biliary drainage catheter with IR on 04/26/2022.  Assessment & Plan:   Principal Problem:   Biliary obstruction Active Problems:   Rectal cancer ypT3ypN1a (1/23 LN) s/p neoadj chemoXRT, robotic LAR resection & diverting loop ileostomy 12/21/2017   Hypertension   Hypothyroid   Disorder of bile duct stent  Biliary obstruction - Underwent MRI liver on 04/14/2022 showing high-grade malignant biliary hilar stricture - s/p ERCP 3/19 with localized biliary strictures found in the hepatitic duct system from extrahepatic mestastasis.  Strictures malignant appearing.  Treated with stent placement.  - s/p unasyn course on admission   - removal of biliary stent 3/22 - s/p external biliary drain in right hepatic bile duct 3/22 - CT abd with contrast 3/26 with R sided perc biliary drain with formed pigtail in central right hepatic ducts.  Diminished R sided biliary ductal dilatation.  Similar appearance of L sided biliary ductal dilatation.  Ill defined hypodensity in central liver.   - GI now signed off - s/p fluoroscopy guided right sided approach biliary drainage catheter placed in IR on 04/26/2022; removal of initial biliary drain -Continue flushing drain 3 times daily  WBCs were uptrending and biliary fluid culture noted with Enterobacter; started on cefepime on 3/31; likely treat 5-7 days? Patient's bilirubin has finally started to come down.  - persistent lower abd pain and persistent elevation of liver enzymes; repeat CT 4/1 shows cholelithiasis with wall thickening and edema; new small subcapsular fluid collection superior to entrance site of PTC cath - lipase normal; case discussed with GI and surgery - no additional GI rec's, appreciate re-eval; signed off 4/2 - surgery rec's no CCY but if ongoing pain, then possibly a perc drain; IR also following.  Ultrasound equivocal for cholecystitis.  Patient's pain is improving and she has no nausea or vomiting.  She has no fever and leukocytosis improving as well so we will discontinue antibiotics and hold off to any percutaneous drain placement.  IR is following as well as general surgery.   Rectal cancer ypT3ypN1a (1/23 LN) s/p neoadj chemoXRT, robotic LAR resection & diverting loop ileostomy 12/21/2017 - Follows closely outpatient with oncology; has been needed with Lonsurf; cycle 2 placed on hold recently due to hyperTB   Hypothyroid - Continue Synthroid   Hypertension - relatively controlled -No home meds noted on med rec   Post-ERCP acute pancreatitis-resolved as of 04/27/2022 - resolved   Acute on chronic macrocytic anemia: Baseline hemoglobin appears to be around 9, this has been trending down and currently 7.2.  Has microcytosis.  Will check FOBT, B12 and folate.  Monitor every 12 hours and transfuse if less than 7.  Consent has been obtained from the patient if transfusion needed.  DVT prophylaxis: enoxaparin (LOVENOX) injection 40 mg Start: 04/27/22 1000 SCDs Start: 04/17/22 1934   Code Status: Full Code   Family Communication:  None present at bedside.  Plan of care discussed with patient in length and he/she verbalized understanding and agreed with it.  Status is: Inpatient Remains inpatient appropriate because: Still with some pain and dropping hemoglobin, observe overnight.   Estimated body mass index is 38.27 kg/m as calculated from the following:   Height as of this encounter: 5\' 2"  (1.575 m).   Weight as of this encounter: 94.9 kg.    Nutritional Assessment: Body mass index is 38.27 kg/m.Marland Kitchen Seen by dietician.  I agree with the assessment and plan as outlined below: Nutrition Status: Nutrition Problem: Increased nutrient needs Etiology: chronic illness Signs/Symptoms: estimated needs Interventions: Boost Breeze, Ensure Enlive (each supplement provides 350kcal and 20 grams of protein)  . Skin Assessment: I have examined the patient's skin and I agree with the wound assessment as performed by the wound care RN as outlined below:    Consultants:  General surgery, GI and IR  Procedures:  As above  Antimicrobials:  Anti-infectives (From admission, onward)    Start     Dose/Rate Route Frequency Ordered Stop   04/30/22 0900  ceFEPIme (MAXIPIME) 2 g in sodium chloride 0.9 % 100 mL IVPB        2 g 200 mL/hr over 30 Minutes Intravenous Every 8 hours 04/30/22 0803     04/26/22 0730  cefOXitin (MEFOXIN) 2 g in sodium chloride 0.9 % 100 mL IVPB        2 g 200 mL/hr over 30 Minutes Intravenous On call 04/25/22 1555 04/27/22 0730   04/18/22 1230  ampicillin-sulbactam (UNASYN) 1.5 g in sodium chloride 0.9 % 100 mL IVPB  Status:  Discontinued        1.5 g 200 mL/hr over 30 Minutes Intravenous  Once 04/18/22 1227 04/18/22 1531   04/18/22 1100  ampicillin-sulbactam (UNASYN) 1.5 g in sodium chloride 0.9 % 100 mL IVPB  Status:  Discontinued        1.5 g 200 mL/hr over 30 Minutes Intravenous Every 6 hours 04/18/22 0958 04/25/22 0928         Subjective: Patient seen and examined.   She says that she is slightly better but not at her baseline yet.  She still complains of right flank pain.  No other complaint.  Objective: Vitals:   05/02/22 0448 05/02/22 1437 05/02/22 2110 05/03/22 0417  BP: (!) 126/52 (!) 125/54 (!) 126/51 (!) 131/54  Pulse: 79 76 72 73  Resp: 14 20 17 17   Temp: 98.2 F (36.8 C) 98.3 F (36.8 C) 98.1 F (36.7 C) 97.7 F (36.5 C)  TempSrc: Oral Oral Oral Oral  SpO2: 98% 100% 100% 99%  Weight: 93.6 kg   94.9 kg  Height:        Intake/Output Summary (Last 24 hours) at 05/03/2022 1053 Last data filed at 05/03/2022 0920 Gross per 24 hour  Intake 3031.45 ml  Output 990 ml  Net 2041.45 ml   Filed Weights   04/28/22 0500 05/02/22 0448 05/03/22 0417  Weight: 98.8 kg 93.6 kg 94.9 kg    Examination:  General exam: Appears calm and comfortable, obese Respiratory system: Clear to auscultation. Respiratory effort normal. Cardiovascular system: S1 & S2 heard, RRR. No JVD, murmurs, rubs, gallops or clicks. No pedal edema. Gastrointestinal system: Abdomen is nondistended, soft and  has mild epigastric and right upper quadrant tenderness. No organomegaly or masses felt. Normal bowel sounds heard. Central nervous system: Alert and oriented. No focal neurological deficits. Extremities: Symmetric 5 x 5 power. Skin: No rashes, lesions or ulcers Psychiatry: Judgement and insight appear normal. Mood & affect appropriate.    Data Reviewed: I have personally reviewed following labs and imaging studies  CBC: Recent Labs  Lab 04/29/22 0611 04/30/22 0500 05/01/22 0500 05/02/22 0500 05/03/22 0701  WBC 8.1 11.8* 15.5* 6.5 3.7*  NEUTROABS 6.9 10.0* 13.5* 5.2 2.8  HGB 8.9* 9.5* 8.8* 7.6* 7.2*  HCT 27.6* 29.1* 26.9* 23.5* 22.9*  MCV 106.2* 106.2* 107.2* 108.8* 111.2*  PLT 116* 126* 108* 85* 92*   Basic Metabolic Panel: Recent Labs  Lab 04/29/22 0611 04/30/22 0500 05/01/22 0500 05/02/22 0500 05/03/22 0701  NA 133* 131* 131* 132* 136  K 4.0 3.8 3.9  3.4* 3.5  CL 99 98 101 106 111  CO2 25 24 23  20* 20*  GLUCOSE 112* 126* 120* 102* 88  BUN 14 15 13 17 14   CREATININE 0.55 0.59 0.53 0.54 0.58  CALCIUM 8.1* 8.1* 7.8* 7.7* 7.6*  MG 2.0 2.2 2.1 2.2 2.1   GFR: Estimated Creatinine Clearance: 67.2 mL/min (by C-G formula based on SCr of 0.58 mg/dL). Liver Function Tests: Recent Labs  Lab 04/29/22 0611 04/30/22 0500 05/01/22 0500 05/02/22 0500 05/03/22 0701  AST 74* 71* 61* 48* 56*  ALT 53* 55* 49* 43 40  ALKPHOS 207* 187* 149* 134* 112  BILITOT 18.2* 20.4* 19.6* 16.3* 13.4*  PROT 5.6* 6.0* 5.4* 5.2* 4.9*  ALBUMIN 2.2* 2.3* 2.0* 1.8* 1.7*   Recent Labs  Lab 05/01/22 0500  LIPASE 27   No results for input(s): "AMMONIA" in the last 168 hours. Coagulation Profile: Recent Labs  Lab 04/30/22 0500  INR 1.4*   Cardiac Enzymes: No results for input(s): "CKTOTAL", "CKMB", "CKMBINDEX", "TROPONINI" in the last 168 hours. BNP (last 3 results) No results for input(s): "PROBNP" in the last 8760 hours. HbA1C: No results for input(s): "HGBA1C" in the last 72 hours. CBG: No results for input(s): "GLUCAP" in the last 168 hours. Lipid Profile: No results for input(s): "CHOL", "HDL", "LDLCALC", "TRIG", "CHOLHDL", "LDLDIRECT" in the last 72 hours. Thyroid Function Tests: No results for input(s): "TSH", "T4TOTAL", "FREET4", "T3FREE", "THYROIDAB" in the last 72 hours. Anemia Panel: No results for input(s): "VITAMINB12", "FOLATE", "FERRITIN", "TIBC", "IRON", "RETICCTPCT" in the last 72 hours. Sepsis Labs: No results for input(s): "PROCALCITON", "LATICACIDVEN" in the last 168 hours.  Recent Results (from the past 240 hour(s))  Body fluid culture w Gram Stain     Status: None   Collection Time: 04/27/22  3:28 PM   Specimen: BILE; Body Fluid  Result Value Ref Range Status   Specimen Description   Final    BILE Performed at [facility] [address], [place], [place] [address]    Special Requests   Final     Normal Performed at [facility] [place] [address], [place], [place] [address]    Gram Stain   Final    NO WBC SEEN ABUNDANT GRAM NEGATIVE RODS FEW GRAM POSITIVE COCCI IN CHAINS RARE BUDDING YEAST SEEN Performed at [facility], [place] [address], [place], [place] [address]    Culture ABUNDANT ENTEROBACTER CLOACAE  Final   Report Status 04/29/2022 FINAL  Final   Organism ID, Bacteria ENTEROBACTER CLOACAE  Final      Susceptibility   Enterobacter cloacae -  MIC*    CEFEPIME <=0.12 SENSITIVE Sensitive     CEFTAZIDIME <=1 SENSITIVE Sensitive     CIPROFLOXACIN <=0.25 SENSITIVE Sensitive     GENTAMICIN <=1 SENSITIVE Sensitive     IMIPENEM 1 SENSITIVE Sensitive     TRIMETH/SULFA <=20 SENSITIVE Sensitive     PIP/TAZO <=4 SENSITIVE Sensitive     * ABUNDANT ENTEROBACTER CLOACAE     Radiology Studies: US Abdomen Limited RUQ (LIVER/GB)  Result Date: 05/02/2022 CLINICAL DATA:  Cholelithiasis EXAM: ULTRASOUND ABDOMEN LIMITED RIGHT UPPER QUADRANT COMPARISON:  CT abdomen and pelvis dated 05/01/2022 FINDINGS: Gallbladder: Mild gallbladder mural thickening measures 4 mm. Sludge and stones within the gallbladder, predominantly lodged at the neck. No sonographic Murphy sign noted by sonographer. Partially imaged echogenic catheter near the hepatic hilum. Common bile duct: Diameter: 3 mm Liver: No focal lesion identified in the right hepatic lobe. Left hepatic lobe is suboptimally evaluated due to overlying bowel gas. Portal vein is patent on color Doppler imaging with normal direction of blood flow towards the liver. Other: None. IMPRESSION: 1. Sludge and stones within the gallbladder, predominantly lodged at the neck. Mild gallbladder mural thickening measures 4 mm. No sonographic Murphy sign noted by sonographer. Findings are equivocal for acute cholecystitis. 2. No extrahepatic or intrahepatic right biliary ductal dilation. Left hepatic lobe is suboptimally evaluated due to overlying  bowel gas. Electronically Signed   By: Darrin Nipper M.D.   On: 05/02/2022 08:25    Scheduled Meds:  calcium carbonate  2 tablet Oral TID WC   Chlorhexidine Gluconate Cloth  6 each Topical Daily   enoxaparin (LOVENOX) injection  40 mg Subcutaneous Q24H   levothyroxine  50 mcg Oral Daily   pantoprazole  40 mg Oral Daily   predniSONE  5 mg Oral Q breakfast   sodium chloride flush  5 mL Intracatheter Q8H   sodium chloride flush  5 mL Intracatheter Q8H   Continuous Infusions:  sodium chloride 75 mL/hr at 05/02/22 2124   ceFEPime (MAXIPIME) IV 2 g (05/03/22 0920)     LOS: 16 days   Darliss Cheney, MD [facility]  05/03/2022, 10:53 AM   *Please note that this is a verbal dictation therefore any spelling or grammatical errors are due to the "[place] [employer]" system interpretation.  Please page via [place] and do not message via secure chat for urgent patient care matters. Secure chat can be used for non urgent patient care matters.  How to contact the [facility] Attending or Consulting provider [place] or covering provider during after hours [place], for this patient?  Check the care team in [facility] and look for a) attending/consulting [facility] provider listed and b) the [facility] team listed. Page or secure chat [phone]. Log into [email or web address] and use [place] universal password to access. If you do not have the password, please contact the hospital operator. Locate the [facility] provider you are looking for under [facility] and page to a number that you can be directly reached. If you still have difficulty reaching the provider, please page the [facility] (Director on Call) for the Hospitalists listed on amion for assistance.

## 2022-05-04 DIAGNOSIS — K831 Obstruction of bile duct: Secondary | ICD-10-CM | POA: Diagnosis not present

## 2022-05-04 LAB — COMPREHENSIVE METABOLIC PANEL
ALT: 45 U/L — ABNORMAL HIGH (ref 0–44)
AST: 68 U/L — ABNORMAL HIGH (ref 15–41)
Albumin: 1.6 g/dL — ABNORMAL LOW (ref 3.5–5.0)
Alkaline Phosphatase: 115 U/L (ref 38–126)
Anion gap: 4 — ABNORMAL LOW (ref 5–15)
BUN: 12 mg/dL (ref 8–23)
CO2: 19 mmol/L — ABNORMAL LOW (ref 22–32)
Calcium: 7.4 mg/dL — ABNORMAL LOW (ref 8.9–10.3)
Chloride: 112 mmol/L — ABNORMAL HIGH (ref 98–111)
Creatinine, Ser: 0.52 mg/dL (ref 0.44–1.00)
GFR, Estimated: >60 mL/min (ref >60)
Glucose, Bld: 94 mg/dL (ref 70–99)
Potassium: 3.3 mmol/L — ABNORMAL LOW (ref 3.5–5.1)
Sodium: 135 mmol/L (ref 135–145)
Total Bilirubin: 12 mg/dL — ABNORMAL HIGH (ref 0.3–1.2)
Total Protein: 5 g/dL — ABNORMAL LOW (ref 6.5–8.1)

## 2022-05-04 LAB — CBC WITH DIFFERENTIAL/PLATELET
Abs Immature Granulocytes: 0.06 10*3/uL (ref 0.00–0.07)
Basophils Absolute: 0 10*3/uL (ref 0.0–0.1)
Basophils Relative: 1 %
Eosinophils Absolute: 0.1 10*3/uL (ref 0.0–0.5)
Eosinophils Relative: 1 %
HCT: 23.4 % — ABNORMAL LOW (ref 36.0–46.0)
Hemoglobin: 7.4 g/dL — ABNORMAL LOW (ref 12.0–15.0)
Immature Granulocytes: 1 %
Lymphocytes Relative: 8 %
Lymphs Abs: 0.3 10*3/uL — ABNORMAL LOW (ref 0.7–4.0)
MCH: 34.9 pg — ABNORMAL HIGH (ref 26.0–34.0)
MCHC: 31.6 g/dL (ref 30.0–36.0)
MCV: 110.4 fL — ABNORMAL HIGH (ref 80.0–100.0)
Monocytes Absolute: 0.7 10*3/uL (ref 0.1–1.0)
Monocytes Relative: 17 %
Neutro Abs: 3 10*3/uL (ref 1.7–7.7)
Neutrophils Relative %: 72 %
Platelets: 113 10*3/uL — ABNORMAL LOW (ref 150–400)
RBC: 2.12 MIL/uL — ABNORMAL LOW (ref 3.87–5.11)
RDW: 17.2 % — ABNORMAL HIGH (ref 11.5–15.5)
WBC: 4.2 10*3/uL (ref 4.0–10.5)
nRBC: 0 % (ref 0.0–0.2)

## 2022-05-04 LAB — MAGNESIUM: Magnesium: 1.9 mg/dL (ref 1.7–2.4)

## 2022-05-04 MED ORDER — METRONIDAZOLE 500 MG PO TABS: 500.0000 mg | ORAL_TABLET | Freq: Three times a day (TID) | ORAL | 0 refills | Status: AC

## 2022-05-04 MED ORDER — POTASSIUM CHLORIDE CRYS ER 20 MEQ PO TBCR
40.0000 meq | EXTENDED_RELEASE_TABLET | Freq: Once | ORAL | Status: AC
  Administered 2022-05-04: 40 meq via ORAL
  Filled 2022-05-04: qty 2

## 2022-05-04 MED ORDER — HEPARIN SOD (PORK) LOCK FLUSH 100 UNIT/ML IV SOLN
500.0000 [IU] | Freq: Once | INTRAVENOUS | Status: AC
  Administered 2022-05-04: 500 [IU] via INTRAVENOUS
  Filled 2022-05-04: qty 5

## 2022-05-04 MED ORDER — CIPROFLOXACIN HCL 500 MG PO TABS: 500.0000 mg | ORAL_TABLET | Freq: Two times a day (BID) | ORAL | 0 refills | Status: AC

## 2022-05-04 NOTE — Progress Notes (Signed)
Teaching done at bedside with patient's daughter:  1.  How to monitor and inspect biliary tube  (report to MD increased/severe pain that is not relieved by medications, fever, redness or drainage around site, and ir tube becomes dislodged).  2.  How to irrigate the drain (Supplies provided. Verbal and written instructions provided).  3.  Empty the drainage bag and record the amount of output (Radiology Team provided a recording chart).  4.  How to change the dressing (dressing supplies provided).  Patient and her daughter were given the opportunity to ask questions and were able to teach back the instructions.  Phone numbers provided in case any further questions or issues.

## 2022-05-04 NOTE — Discharge Summary (Signed)
Physician Discharge Summary  Samantha Lutz [id number] DOB: Nov 01, 1950 DOA: 04/17/2022  PCP: Andre Lefort, FNP  Admit date: 04/17/2022 Discharge date: 05/04/2022 30 Day Unplanned Readmission Risk Score    Flowsheet Row Admission (Current) from 04/17/2022 in [facility]  30 Day Unplanned Readmission Risk Score (%) 23.21 Filed at 05/04/2022 0801       This score is the patient's risk of an unplanned readmission within 30 days of being discharged (0 -100%). The score is based on dignosis, age, lab data, medications, orders, and past utilization.   Low:  0-14.9   Medium: 15-21.9   High: 22-29.9   Extreme: 30 and above          Admitted From: Home Disposition: Home  Recommendations for Outpatient Follow-up:  Follow up with PCP in 1-2 weeks Please obtain BMP/CBC in one week Please follow up with your PCP on the following pending results: Unresulted Labs (From admission, onward)     Start     Ordered   05/03/22 1057  Occult blood card to lab, stool  Once,   R        05/03/22 1056   05/02/22 0500  Comprehensive metabolic panel  Daily,   R     Question:  Specimen collection method  Answer:  Unit=Unit collect   05/01/22 1547   05/02/22 0500  Magnesium  Daily,   R     Question:  Specimen collection method  Answer:  Unit=Unit collect   05/01/22 [place]: None Equipment/Devices: None  Discharge Condition: Stable CODE STATUS: Full code Diet recommendation: Cardiac  Subjective: Seen and examined.  She feels well.  Very mild right flank pain but no nausea or vomiting or any abdominal pain and she wants to go home.  Brief/Interim Summary: Samantha Lutz is a 72 yo female with PMH rectal cancer with liver metastasis who presented with mild abdominal discomfort and outpatient elevated total bilirubin.  MRI liver was obtained on 04/14/2022 which showed underlying hypoenhancing central liver mass measuring 2.7 x 1.9 cm compatible with  solitary recurrent liver metastasis.  Also showed underlying associated high-grade malignant biliary hilar stricture.  She was admitted for GI evaluation.    She underwent ERCP and stent placement 3/19. Hospitalization complicated by post ERCP pancreatitis and rising LFT's. GI removed biliary stent on 3/22 and IR was consulted for external biliary drain. She underwent fluoro guided right sided transhepatic biliary drainage catheter with IR on 04/26/2022.   Biliary obstruction - Underwent MRI liver on 04/14/2022 showing high-grade malignant biliary hilar stricture. s/p ERCP 3/19 with localized biliary strictures found in the hepatitic duct system from extrahepatic mestastasis.  Strictures malignant appearing.  Treated with stent placement.  - s/p unasyn course on admission   - removal of biliary stent 3/22 - s/p external biliary drain in right hepatic bile duct 3/22 - CT abd with contrast 3/26 with R sided perc biliary drain with formed pigtail in central right hepatic ducts.  Diminished R sided biliary ductal dilatation.  Similar appearance of L sided biliary ductal dilatation.  Ill defined hypodensity in central liver.  - GI now signed off - s/p fluoroscopy guided right sided approach biliary drainage catheter placed in IR on 04/26/2022; removal of initial biliary drain -Continue flushing drain 3 times daily  WBCs were uptrending and biliary fluid culture noted with Enterobacter; started on cefepime on 3/31; likely treat 5-7 days? Patient's  bilirubin has finally started to come down.   -Due to persistent lower abd pain and persistent elevation of liver enzymes; repeat CT 4/1 shows cholelithiasis with wall thickening and edema; new small subcapsular fluid collection superior to entrance site of PTC cath - lipase normal; case discussed with GI and surgery - no additional GI rec's, appreciate re-eval; signed off 4/2 - surgery rec's no CCY but if ongoing pain, then possibly a perc drain; IR also  followed.  Ultrasound equivocal for cholecystitis.  Patient's pain is improving and she has no nausea or vomiting.  She has no fever and leukocytosis improving as well.  Patient feels very comfortable and wants to go home.  She has received 5 days of antibiotics, as mentioned above, findings are equivocal for possible acute cholecystitis.  To be on the safe side, I will prescribe her 5 more days of oral ciprofloxacin and Flagyl to complete course for 10 days.  IR will follow-up with her and provide instructions about drain management.   Rectal cancer ypT3ypN1a (1/23 LN) s/p neoadj chemoXRT, robotic LAR resection & diverting loop ileostomy 12/21/2017 - Follows closely outpatient with oncology; has been needed with Lonsurf; cycle 2 placed on hold recently due to hyperTB   Hypothyroid - Continue Synthroid   Hypertension - relatively controlled -No home meds noted on med rec   Post-ERCP acute pancreatitis-resolved as of 04/27/2022 - resolved    Acute on chronic macrocytic anemia: Baseline hemoglobin appears to be around 9, this has been trending down and currently 7.2 yesterday but improved to 7.8 today, has macrocytosis, B12 and folate normal.  No reports of melena or hematochezia.  Discharge plan was discussed with patient and/or family member and they verbalized understanding and agreed with it.  Discharge Diagnoses:  Principal Problem:   Biliary obstruction Active Problems:   Rectal cancer ypT3ypN1a (1/23 LN) s/p neoadj chemoXRT, robotic LAR resection & diverting loop ileostomy 12/21/2017   Hypertension   Hypothyroid   Disorder of bile duct stent    Discharge Instructions   Allergies as of 05/04/2022       Reactions   Epinephrine Palpitations   Losartan Hives   Latex Itching, Other (See Comments)   Skin redness   Vectibix [panitumumab] Other (See Comments), Cough   Non-productive coughing with chest tightness.  Patient had hypersensitivity reaction to Vectibix. See progress note  from 11/01/2021 at 4:33 PM. Patient able to complete infusion.         Medication List     STOP taking these medications    Lonsurf 20-8.19 MG tablet Generic drug: trifluridine-tipiracil   nystatin powder Commonly known as: MYCOSTATIN/NYSTOP       TAKE these medications    AeroChamber MV inhaler Use as instructed   albuterol 108 (90 Base) MCG/ACT inhaler Commonly known as: Proventil HFA Inhale 2 puffs into the lungs every 6 (six) hours as needed for wheezing or shortness of breath.   bisacodyl 5 MG EC tablet Commonly known as: DULCOLAX Take 5 mg by mouth daily as needed for mild constipation or moderate constipation.   ciprofloxacin 500 MG tablet Commonly known as: Cipro Take 1 tablet (500 mg total) by mouth 2 (two) times daily for 5 days. What changed:  medication strength how much to take when to take this   Ensure Original Liqd Take 237 mLs by mouth See admin instructions. Drink 237 ml's (1 CHOCOLATE shake) by mouth once a day   levothyroxine 50 MCG tablet Commonly known as: SYNTHROID Take 1 tablet  by mouth once daily What changed: when to take this   lidocaine-prilocaine cream Commonly known as: EMLA Apply 1 Application topically as needed. Apply to port site 1-2 hours prior to use What changed:  reasons to take this additional instructions   magic mouthwash Soln Take 5 mLs by mouth 4 (four) times daily as needed for mouth pain. 5 ml swish and spit 4 times a daily as needed for mouth pain   magnesium oxide 400 (240 Mg) MG tablet Commonly known as: MAG-OX Take 1 tablet (400 mg total) by mouth 2 (two) times daily.   metroNIDAZOLE 500 MG tablet Commonly known as: Flagyl Take 1 tablet (500 mg total) by mouth 3 (three) times daily for 5 days.   potassium chloride 10 MEQ CR capsule Commonly known as: MICRO-K Take 10 mEq by mouth daily.   predniSONE 5 MG tablet Commonly known as: DELTASONE Take 1 tablet by mouth once daily with breakfast    prochlorperazine 10 MG tablet Commonly known as: COMPAZINE Take 1 tablet (10 mg total) by mouth every 6 (six) hours as needed for nausea or vomiting.   Systane Ultra PF 0.4-0.3 % Soln Generic drug: Polyethyl Glyc-Propyl Glyc PF Place 1 drop into both eyes 3 (three) times daily as needed (for dryness).        Follow-up Information     Andre Lefort, FNP Follow up in 1 week(s).   Specialty: Family Medicine Contact information: [address] [place] [address] [phone]                Allergies  Allergen Reactions   Epinephrine Palpitations   Losartan Hives   Latex Itching and Other (See Comments)    Skin redness   Vectibix [Panitumumab] Other (See Comments) and Cough    Non-productive coughing with chest tightness.   Patient had hypersensitivity reaction to Vectibix. See progress note from 11/01/2021 at 4:33 PM. Patient able to complete infusion.     Consultations: GI, general surgery and IR.   Procedures/Studies: US Abdomen Limited RUQ (LIVER/GB)  Result Date: 05/02/2022 CLINICAL DATA:  Cholelithiasis EXAM: ULTRASOUND ABDOMEN LIMITED RIGHT UPPER QUADRANT COMPARISON:  CT abdomen and pelvis dated 05/01/2022 FINDINGS: Gallbladder: Mild gallbladder mural thickening measures 4 mm. Sludge and stones within the gallbladder, predominantly lodged at the neck. No sonographic Murphy sign noted by sonographer. Partially imaged echogenic catheter near the hepatic hilum. Common bile duct: Diameter: 3 mm Liver: No focal lesion identified in the right hepatic lobe. Left hepatic lobe is suboptimally evaluated due to overlying bowel gas. Portal vein is patent on color Doppler imaging with normal direction of blood flow towards the liver. Other: None. IMPRESSION: 1. Sludge and stones within the gallbladder, predominantly lodged at the neck. Mild gallbladder mural thickening measures 4 mm. No sonographic Murphy sign noted by sonographer. Findings are equivocal for acute  cholecystitis. 2. No extrahepatic or intrahepatic right biliary ductal dilation. Left hepatic lobe is suboptimally evaluated due to overlying bowel gas. Electronically Signed   By: Darrin Nipper M.D.   On: 05/02/2022 08:25   CT ABDOMEN W CONTRAST  Result Date: 05/01/2022 CLINICAL DATA:  Biliary obstruction. Metastatic colon cancer. Stent placement. * Tracking Code: BO * EXAM: CT ABDOMEN WITH CONTRAST TECHNIQUE: Multidetector CT imaging of the abdomen was performed using the standard protocol following bolus administration of intravenous contrast. RADIATION DOSE REDUCTION: This exam was performed according to the departmental dose-optimization program which includes automated exposure control, adjustment of the mA and/or kV according to patient  size and/or use of iterative reconstruction technique. CONTRAST:  162mL OMNIPAQUE IOHEXOL 300 MG/ML  SOLN COMPARISON:  04/25/2022 CT FINDINGS: Lower chest: The majority of the chest was scanned as part of the examination from the level of the aortic arch down through the diaphragm. Chest port is seen. Heart is nonenlarged. Scattered vascular calcifications. 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 Slightly patulous esophagus. Lung nodules are identified. Example left upper lobe measuring 15 by 11 mm on series 6, image 28. There are additional larger areas along the right lower lobe but partially obscured by the adjacent lung opacity and small right pleural effusion. Dedicated chest CT may be useful to further delineate these findings in correlation to the patient's history of metastatic disease from colon cancer. Hepatobiliary: There has been placement of a right-sided indwelling PTC catheter with has distal pigtail extending into the second portion of the duodenal distally. There is a small subcapsular fluid collection just superior to the entrance site of the catheter on series 2, image 28 which is new from previous measuring 16 by 6 mm. There are areas of  biliary ductal dilatation identified particularly left hepatic lobe. These are similar to slightly improved from the prior CT scan. No new areas of intrahepatic biliary ductal dilatation. Gallbladder once again is severely dilated with stones. There is also some wall thickening and edema today which is progressive from the prior examination. Central small liver mass identified in segment 4 on series 2, image 26 measuring 14 by 13 mm. Pancreas: Unremarkable. No pancreatic ductal dilatation or surrounding inflammatory changes. Spleen: Spleen is enlarged with a length of 13.9 cm. Previously 14.4 cm, similar. Adrenals/Urinary Tract: The adrenal glands are preserved. Kidneys are unremarkable. Stomach/Bowel: Visualized bowel is nondilated. Slight wall thickening along the duodenal, nonspecific. Surgical changes along the loops of bowel in the right lower quadrant. Vascular/Lymphatic: Normal caliber aorta and IVC with some scattered vascular calcifications. There is a prominent lymph node along the portacaval region measuring 16 by 10 mm, mildly enlarged. In retrospect unchanged from prior. Few other adjacent small prominent nodes identified as well. Other: Mild free fluid.  No free intra-air. Musculoskeletal: Degenerative changes seen along the spine. IMPRESSION: Indwelling right-sided PTC catheter. The level of the intrahepatic biliary duct dilatation is similar to slightly decreased. New small subcapsular fluid collection along the liver just superior to the entrance site of the PTC catheter. Dilated gallbladder with stones. There is progressive wall thickening and wall edema. Please correlate with any clinical findings of acute cholecystitis. If needed dedicated workup such as HIDA scan could be considered Stable appearance of the intrinsic liver mass segment 4 as well as mildly enlarged porta hepatic lymph node. 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 Trace ascites.  Lung masses identified. Please correlate for history of metastatic disease to the chest. If needed additional chest CT dedicated workup can be performed when clinically appropriate. Persistent small right pleural effusion. Electronically Signed   By: Jill Side M.D.   On: 05/01/2022 11:05   ECHOCARDIOGRAM COMPLETE  Result Date: 04/27/2022    ECHOCARDIOGRAM REPORT   Patient Name:   Samantha Lutz Date of Exam: 04/27/2022 Medical Rec #:  [id number]        Height:       62.0 in Accession #:    [id number]       Weight:       215.8 lb Date of Birth:  09/21/1950       BSA:  1.975 m Patient Age:    25 years         BP:           151/65 mmHg Patient Gender: F                HR:           74 bpm. Exam Location:  Inpatient Procedure: 2D Echo, 3D Echo, Cardiac Doppler, Color Doppler and Strain Analysis Indications:    R00.8 Other abnormalities of heart beat  History:        Patient has no prior history of Echocardiogram examinations.                 Risk Factors:Hypertension. Metastatic cancer. Chemo.  Sonographer:    Roseanna Rainbow RDCS Referring Phys: [phone] A CALDWELL POWELL JR  Sonographer Comments: Technically difficult study due to poor echo windows, patient is obese, suboptimal subcostal window and suboptimal parasternal window. Image acquisition challenging due to patient body habitus. IMPRESSIONS  1. Left ventricular ejection fraction, by estimation, is 70 to 75%. The left ventricle has hyperdynamic function. The left ventricle has no regional wall motion abnormalities. Left ventricular diastolic parameters were normal.  2. Right ventricular systolic function is normal. The right ventricular size is normal. Tricuspid regurgitation signal is inadequate for assessing PA pressure.  3. The mitral valve is normal in structure. Trivial mitral valve regurgitation. No evidence of mitral stenosis.  4. The aortic valve was not well visualized. Aortic valve regurgitation is not visualized. No aortic stenosis is  present.  5. The inferior vena cava is normal in size with greater than 50% respiratory variability, suggesting right atrial pressure of 3 mmHg. FINDINGS  Left Ventricle: Left ventricular ejection fraction, by estimation, is 70 to 75%. The left ventricle has hyperdynamic function. The left ventricle has no regional wall motion abnormalities. 3D ejection fraction reviewed and evaluated as part of the interpretation. Alternate measurement of EF is felt to be most reflective of LV function. The left ventricular internal cavity size was small. There is no left ventricular hypertrophy. Left ventricular diastolic parameters were normal. Right Ventricle: The right ventricular size is normal. No increase in right ventricular wall thickness. Right ventricular systolic function is normal. Tricuspid regurgitation signal is inadequate for assessing PA pressure. Left Atrium: Left atrial size was normal in size. Right Atrium: Right atrial size was normal in size. Pericardium: There is no evidence of pericardial effusion. Mitral Valve: The mitral valve is normal in structure. Trivial mitral valve regurgitation. No evidence of mitral valve stenosis. Tricuspid Valve: The tricuspid valve is normal in structure. Tricuspid valve regurgitation is trivial. No evidence of tricuspid stenosis. Aortic Valve: The aortic valve was not well visualized. Aortic valve regurgitation is not visualized. No aortic stenosis is present. Pulmonic Valve: The pulmonic valve was normal in structure. Pulmonic valve regurgitation is not visualized. No evidence of pulmonic stenosis. Aorta: Linear echodensity in the ascending aorta most likely represents reverberation artifact. The aortic root is normal in size and structure. Venous: The inferior vena cava is normal in size with greater than 50% respiratory variability, suggesting right atrial pressure of 3 mmHg. IAS/Shunts: The atrial septum is grossly normal.  LEFT VENTRICLE PLAX 2D LVIDd:         4.00 cm      Diastology LVIDs:         2.20 cm     LV e' medial:    7.51 cm/s LV PW:  1.00 cm     LV E/e' medial:  16.5 LV IVS:        0.90 cm     LV e' lateral:   7.83 cm/s LVOT diam:     2.10 cm     LV E/e' lateral: 15.8 LV SV:         94 LV SV Index:   47 LVOT Area:     3.46 cm  LV Volumes (MOD) LV vol d, MOD A2C: 58.3 ml LV vol d, MOD A4C: 56.3 ml LV vol s, MOD A2C: 12.4 ml LV vol s, MOD A4C: 10.4 ml LV SV MOD A2C:     45.9 ml LV SV MOD A4C:     56.3 ml LV SV MOD BP:      44.9 ml RIGHT VENTRICLE RV S prime:     13.10 cm/s TAPSE (M-mode): 2.0 cm LEFT ATRIUM             Index        RIGHT ATRIUM          Index LA diam:        3.40 cm 1.72 cm/m   RA Area:     8.52 cm LA Vol (A2C):   34.1 ml 17.27 ml/m  RA Volume:   14.00 ml 7.09 ml/m LA Vol (A4C):   31.2 ml 15.80 ml/m LA Biplane Vol: 33.5 ml 16.97 ml/m  AORTIC VALVE LVOT Vmax:   118.00 cm/s LVOT Vmean:  79.500 cm/s LVOT VTI:    0.270 m  AORTA Ao Root diam: 2.80 cm Ao Asc diam:  3.30 cm MITRAL VALVE MV Area (PHT): 3.42 cm     SHUNTS MV Decel Time: 222 msec     Systemic VTI:  0.27 m MV E velocity: 124.00 cm/s  Systemic Diam: 2.10 cm MV A velocity: 128.00 cm/s MV E/A ratio:  0.97 Cherlynn Kaiser MD Electronically signed by Cherlynn Kaiser MD Signature Date/Time: 04/27/2022/2:32:06 PM    Final    IR CONVERT BILIARY DRAIN TO INT EXT BILIARY DRAIN  Result Date: 04/26/2022 INDICATION: History of metastatic colon cancer, now with malignant obstruction at the level of the biliary hilum. Patient underwent placement of an external biliary drainage catheter on 04/21/2022 however initial access was very challenging secondary to a combination lack of significant dilatation of the right intrahepatic biliary system as well as patient body habitus. Ultimately, a duct within the posterior aspect of the right lobe of the liver was cannulated however would not allow for placement of an internal/external drainage catheter secondary to angulation of the access site, confirmed on  subsequent abdominal CT performed 04/25/2022. As such, patient presents today for attempted placement of a new right-sided biliary drainage catheter as well as potential placement of a left-sided biliary drainage catheter. EXAM: 1. FLUOROSCOPIC GUIDED PERCUTANEOUS TRANSHEPATIC CHOLANGIOGRAM AND BILIARY TUBE PLACEMENT VIA RIGHT-SIDED BILIARY APPROACH 2. ATTEMPTED THOUGH ULTIMATELY UNSUCCESSFUL TRAVERSAL INTO THE ATROPHIC LEFT INTRAHEPATIC BILIARY SYSTEM. COMPARISON:  CT abdomen and pelvis-04/25/2022; 04/13/2022; fluoroscopic images during right-sided external biliary drainage catheter placement-04/21/2022 MEDICATIONS: Ceftriaxone 2 g; the antibiotic was administered with an appropriate time frame prior to the initiation of the procedure CONTRAST:  63mL OMNIPAQUE IOHEXOL 300 MG/ML SOLN - administered into the biliary tree. ANESTHESIA/SEDATION: General anesthesia FLUOROSCOPY TIME:  [id number] mGy COMPLICATIONS: None immediate. TECHNIQUE: Informed written consent was obtained from the patient after a discussion of the risks, benefits and alternatives to treatment. Questions regarding the procedure were encouraged and answered. A timeout was performed prior  to the initiation of the procedure. Patient was placed under general anesthesia via the anesthesia department Next, with the patient positioned slightly left lateral decubitus, the right upper abdominal quadrant was prepped and draped in the usual sterile fashion, and a sterile drape was applied covering the operative field. Maximum barrier sterile technique with sterile gowns and gloves were used for the procedure. A timeout was performed prior to the initiation of the procedure. The pre-existing suboptimally positioned right-sided external biliary drainage catheter was injected with a small amount of contrast demonstrating appropriate positioning with end coiled and locked within the central aspect of the right biliary tree with minimal opacification of the common bile  duct. There is no definitive opacification of the left intrahepatic biliary system. Next, under direct fluoroscopic guidance, the access site of the suboptimally positioned biliary drainage catheter was targeted with a [place]sted needle. Appropriate access to the biliary system was confirmed with a reflux of a small amount of bile as well as advancement of the Nitrex wire along the pre-existing drainage catheter to the level of the biliary hilum. The track was dilated with an Accustick set. A stiff glidewire and a 4 French angled glide catheter were advanced through the outer sheath of the Accustick set to the level of the horizontal segment of the duodenum. Contrast injection confirmed appropriate positioning. Next, the pre-existing biliary drainage catheter was removed intact. At this time, the 4 French angled glide catheter was exchanged for a 7 [place], 35 cm radiopaque tip vascular sheath which was advanced to the level of the biliary hilum. Contrast injection demonstrated faint opacification of the central aspect of the left intrahepatic biliary tree. Despite prolonged efforts, the obstructing lesion at the level of the biliary hilum could not be traversed. Sonographic evaluation was performed of the abdomen and demonstrated a markedly atrophic left lobe of the liver, poorly visualized sonographically and noted to be immediately adjacent to the cardiac apex. There is consideration for direct sticking of the central aspect of the left lobe of the liver however this ultimately was deferred secondary to proximity of the cardiac apex. At this time, the vascular sheath was exchanged for a 10 [place] biliary drainage catheter with end coiled and locked within the duodenum with radiopaque side marker located at the level of the biliary hilum. Contrast injection was performed and the procedure was terminated The biliary drainage catheter was secured at the skin entrance site within interrupted suture, flushed with a  small amount of saline and connected to a gravity bag. The patient was extubated and tolerated the procedure well without immediate postprocedural complication. FINDINGS: Contrast injection of pre-existing external biliary drainage catheter demonstrates appropriate positioning with end coiled and locked within the central aspect of the right biliary tree. There is minimal opacification of the common bile duct without definitive opacification of the left intrahepatic biliary tree. Under fluoroscopic guidance, the access site of the pre-existing biliary drainage catheter was cannulated at a more favorable angle ultimately allowing placement of a internal/external 10 [place] biliary drainage catheter with end coiled within the horizontal segment of the duodenum and radiopaque side marker at the level of the biliary hilum As above, there is faint opacification of the central aspect of the atrophic left intrahepatic biliary tree however this could not be successfully traversed percutaneously from the right-sided approach There was consideration for accessing the atrophic left lobe with ultrasound and/or fluoroscopic guidance however this ultimately was deferred secondary to pre-existing marked atrophy of the left lobe as  well proximity of the cardiac apex. IMPRESSION: 1. Successful percutaneous placement of a new internal/external 10 [place] biliary drainage catheter via a more favorable approach with end coiled within the duodenum and radiopaque side marker at the level of the central aspect of the right intrahepatic biliary tree. 2. Faint opacification of the central most aspect of the atrophic left intrahepatic biliary system. The left intrahepatic biliary system was unable to be traversed percutaneously from the right-sided approach secondary to malignant obstruction of the level of the hilum. Consideration for either sonographic or fluoroscopic puncture of the atrophic left intrahepatic biliary tree was deferred  secondary to marked atrophy of the left lobe of the liver and proximity of the cardiac apex. PLAN: Recommend maintaining the new internal/external biliary drainage catheter to external drainage realizing the output will be variable as some bile may be drain internally. Once the patient's bilirubin reaches a steady nadir, consideration for initiating a capping trial may be performed as indicated. Note, given central location of the biliary obstruction, the patient is likely NOT a candidate for internal biliary stent placement. Electronically Signed   By: Sandi Mariscal M.D.   On: 04/26/2022 18:03   DG CHEST PORT 1 VIEW  Result Date: 04/25/2022 CLINICAL DATA:  Chest pain and shortness of breath EXAM: PORTABLE CHEST 1 VIEW COMPARISON:  12/31/2019, CT from earlier in the same day. FINDINGS: Cardiac shadow is stable. Right chest wall port is again noted. Left lung is clear with the exception of nodules in the upper lobe similar to that seen on previous exams. Right-sided pleural effusion with underlying consolidation is noted similar to that seen on CT from earlier in the same day. No other focal abnormality is noted. IMPRESSION: Persistent right-sided effusion and underlying consolidation. Stable left upper lobe pulmonary nodules. Electronically Signed   By: Inez Catalina M.D.   On: 04/25/2022 19:18   CT ABDOMEN W CONTRAST  Result Date: 04/25/2022 CLINICAL DATA:  Metastatic colon cancer, biliary obstruction suspected, status post stent placement, pain around drainage site * Tracking Code: BO * EXAM: CT ABDOMEN WITH CONTRAST TECHNIQUE: Multidetector CT imaging of the abdomen was performed using the standard protocol following bolus administration of intravenous contrast. RADIATION DOSE REDUCTION: This exam was performed according to the departmental dose-optimization program which includes automated exposure control, adjustment of the mA and/or kV according to patient size and/or use of iterative reconstruction  technique. CONTRAST:  148mL OMNIPAQUE IOHEXOL 300 MG/ML  SOLN COMPARISON:  CT abdomen pelvis, 04/13/2022 FINDINGS: Lower chest: Moderate right pleural effusion associated atelectasis or consolidation, increased compared to prior examination. Underlying mass of the dependent right lung base, similar in appearance. Hepatobiliary: Interval placement of a right-sided percutaneous biliary drain, with a formed pigtail in the central right hepatic ducts (series 2, image 27). No fluid collection or other abnormality associated with the drain tract. Diminished right-sided biliary ductal dilatation on today's examination. Similar appearance of left-sided biliary ductal dilatation (series 2, image 23). No directly visualized obstipated metastasis however there is ill-defined hypodensity in the central liver (series 2, image 23). Numerous small gallstones and excreted contrast in the gallbladder. Pancreas: New inflammatory fat stranding about the pancreatic head and uncinate (series 2, image 41). No acute pancreatic fluid collection. Spleen: Unchanged splenomegaly, maximum span 14.4 cm. Adrenals/Urinary Tract: Adrenal glands are unremarkable. Kidneys are normal, without renal calculi, solid lesion, or hydronephrosis. Stomach/Bowel: Stomach is within normal limits. No evidence of bowel wall thickening, distention, or inflammatory changes. Vascular/Lymphatic: Aortic atherosclerosis. Splenic and gastroesophageal varices (series  2, image 21). No enlarged abdominal lymph nodes. Other: No abdominal wall hernia or abnormality. New small volume perihepatic and perisplenic ascites. Musculoskeletal: No acute or significant osseous findings. IMPRESSION: 1. Interval placement of a right-sided percutaneous biliary drain, with a formed pigtail in the central right hepatic ducts. No fluid collection or other abnormality associated with the drain tract. 2. Diminished right-sided biliary ductal dilatation on today's examination. Similar  appearance of left-sided biliary ductal dilatation. No directly visualized obstipating metastasis however there is ill-defined hypodensity in the central liver keeping with a known metastasis. 3. New inflammatory fat stranding about the pancreatic head and uncinate, consistent with acute pancreatitis. No acute pancreatic fluid collection. 4. New small volume perihepatic and perisplenic ascites. 5. Moderate right pleural effusion associated atelectasis or consolidation, increased compared to prior examination. Underlying mass of the dependent right lung base, similar in appearance. 6. Cholelithiasis. 7. Unchanged splenomegaly. 8. Splenic and gastroesophageal varices. Aortic Atherosclerosis (ICD10-I70.0). Electronically Signed   By: Delanna Ahmadi M.D.   On: 04/25/2022 13:37   IR PERCUTANEOUS TRANSHEPATIC CHOLANGIOGRAM  Result Date: 04/22/2022 INDICATION: 72 year old woman with history metastatic rectal cancer causing biliary obstruction presents to IR for percutaneous cholangiogram and drain placement. Patient's bilirubin did not significantly improve status post ERCP and stent placement due to high position of obstructing hepatic metastasis. EXAM: Percutaneous transhepatic cholangiogram and biliary drain placement MEDICATIONS: Patient currently on inpatient regiment of antibiotics. ANESTHESIA/SEDATION: Moderate (conscious) sedation was employed during this procedure. A total of Versed 4 mg and Fentanyl 200 mcg was administered intravenously by the radiology nurse. Total intra-service moderate Sedation Time: 63 minutes. The patient's level of consciousness and vital signs were monitored continuously by radiology nursing throughout the procedure under my direct supervision. FLUOROSCOPY: Radiation Exposure Index (as provided by the fluoroscopic device): [id number] mGy Kerma COMPLICATIONS: None immediate. PROCEDURE: Informed written consent was obtained from the patient after a thorough discussion of the procedural risks,  benefits and alternatives. All questions were addressed. Maximal Sterile Barrier Technique was utilized including caps, mask, sterile gowns, sterile gloves, sterile drape, hand hygiene and skin antiseptic. A timeout was performed prior to the initiation of the procedure. Patient positioned supine on the procedure table. The right upper quadrant and epigastric skin prepped and draped in usual fashion. Sonographic evaluation of the liver demonstrated minimal biliary dilatation in the right hepatic lobe. The atrophic left hepatic lobe could not be visualized with ultrasound. Following local lidocaine administration, multiple attempts were made to access bile ducts in the right hepatic lobe with a 21 gauge needle, however most are unsuccessful. Access was particularly difficult due to overlying ribs and high position of liver. 21 gauge needle was advanced into the right hepatic lobe using ultrasound guidance and slowly retracted under fluoroscopy until bile return was noted. Contrast administered through the 21 gauge needle opacified the right hepatic ducts and showed patent common bile duct. Given the central location of the accessed duct, decision was made to access a more peripheral right hepatic lobe duct utilizing a 21 gauge needle. 21 gauge needle exchanged for transitional dilator set over 0.018 inch guidewire. Kumpe catheter and glidewire were advanced successfully across the right lobe bile duct stenosis, into the CBD, and eventually into the doudenum. Tract dilation was performed, however the internal external biliary drain could not be advanced into the liver due to the angle of access. Continuous bleeding was seen along the access tract when the catheter or drain were not in place. This is likely due to hemorrhage from an  intercostal vessel. No pulsatile flow was identified. With drain across the access tract, there was no hemorrhage. 5 mL of lidocaine with epi was administered along the tract and  pursestring suture was applied which showed no hemorrhage. Since internal external biliary drain could not be advanced, decision was made to leave on external biliary drain in the right hepatic lobe. 8.5 Leonides Schanz drain was formed in the right hepatic bile ducts. Drain was secured to skin with suture and connected to bag. IMPRESSION: 1. Successful insertion of 8.5 French Dawson Mueller external biliary drain in right hepatic bile duct. 2. Placement was difficult due to position of patient's liver and minimal bile duct dilatation. 3. Slow continuous bleeding was present along the access tract when drain or catheter were not in place, likely originating from intercostal vessel. This was treated with lidocaine with epi and pursestring suture. With drain in place, there was no hemorrhage along the tract. PLAN: I suspect that the patient's hyperbilirubinemia is not due to biliary obstruction given that the common bile duct was patent to the level of the doudenum on percutaneous transhepatic cholangiogram. If the patient's bilirubinemia significantly improves with external drainage, internalization can be considered. Electronically Signed   By: Miachel Roux M.D.   On: 04/22/2022 07:43   DG C-Arm 1-60 Min-No Report  Result Date: 04/21/2022 Fluoroscopy was utilized by the requesting physician.  No radiographic interpretation.   MR ABDOMEN MRCP W WO CONTAST  Result Date: 04/20/2022 CLINICAL DATA:  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 Heavily T2-weighted images of the biliary and pancreatic ducts were obtained, and three-dimensional MRCP images were rendered by post processing. CONTRAST:  46mL GADAVIST GADOBUTROL 1 MMOL/ML IV SOLN  COMPARISON:  ERCP from 04/18/2022 and MRI abdomen through 04/14/2022 FINDINGS: Despite efforts by the technologist and patient, motion artifact is present on today's exam and could not be eliminated. This reduces exam sensitivity and specificity. Lower chest: Stable bilateral pulmonary nodules and right basilar atelectasis along with small right pleural effusion. Mild cardiomegaly. Uphill paraesophageal varices. Hepatobiliary: Dependent gallstones in the gallbladder. The biliary stent is difficult to visualize and is probably best seen on the axial postcontrast images extending from the duodenum up to the biliary tree to the confluence of the right and left hepatic ducts. The stent is not well visualized extending into either the right or left bile duct, and seems to terminate in the vicinity of a central mass which is near the confluence of the right and left hepatic ducts. This mass has indistinct borders complicated by differences in the degree of enhancement of the left hepatic lobe in a right hepatic lobe, with delayed enhancement of the left hepatic lobe compared to the right, but is thought to measure at least about 4.0 by 1.7 cm on image 30 of series 17. There is mild right (particularly right posterior) and left intrahepatic biliary dilatation indicating that the stent may possibly not be fully traversing the mass, or may only be partially traversing the mass. The degree of left hepatic lobe biliary dilatation is mildly improved from 04/14/2022 but not resolved. No well-defined left portal vein enhancement, suspicious for left portal vein thrombosis. Pancreas: Mild pancreatic and peripancreatic edema, correlate with lipase levels to assess for pancreatitis. Spleen: The spleen measures 12.5 by 5.3 by  9.9 cm (volume = 340 cm^3). No significant focal splenic lesion. Adrenals/Urinary Tract:  Unremarkable Stomach/Bowel: Unremarkable Vascular/Lymphatic: Varices along the gastric fundus. Suspicion for left portal  vein thrombosis as noted in the liver section above. The right portal vein, main portal vein, splenic vein, and SMV appear patent. Esophageal and gastric varices indicative of portal venous hypertension. Abdominal aortic atherosclerosis. Other:  Trace fluid in the left paracolic gutter. Musculoskeletal: Suspected superior endplate compression fracture at T8, not readily visible on 01/25/2022, likely subacute. IMPRESSION: 1. The biliary stent appears to extend from the duodenum to the vicinity of a central mass near the confluence of the right and left hepatic ducts. There is mild right and left intrahepatic biliary dilatation indicating that the stent may possibly not be fully traversing the mass, or may only be partially traversing the mass. The degree of left hepatic lobe biliary dilatation is mildly improved from 04/14/2022 but not resolved. 2. No well-defined enhancement in the left portal vein, suspicious for left portal vein thrombosis. 3. Mild pancreatic and peripancreatic edema, correlate with lipase levels to assess for pancreatitis. 4. Stable bilateral pulmonary nodules and right basilar atelectasis along with small right pleural effusion. Mild cardiomegaly. 5. Superior endplate compression fracture at T8, not readily visible on 01/25/2022, likely subacute. 6. Uphill paraesophageal and gastric varices indicating portal venous hypertension. 7. Trace fluid in the left paracolic gutter. 8. Cholelithiasis. Electronically Signed   By: Van Clines M.D.   On: 04/20/2022 09:59   MR 3D Recon At Scanner  Result Date: 04/20/2022 CLINICAL DATA:  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 Heavily T2-weighted images of the biliary and pancreatic  ducts were obtained, and three-dimensional MRCP images were rendered by post processing. CONTRAST:  68mL GADAVIST GADOBUTROL 1 MMOL/ML IV SOLN COMPARISON:  ERCP from 04/18/2022 and MRI abdomen through 04/14/2022 FINDINGS: Despite efforts by the technologist and patient, motion artifact is present on today's exam and could not be eliminated. This reduces exam sensitivity and specificity. Lower chest: Stable bilateral pulmonary nodules and right basilar atelectasis along with small right pleural effusion. Mild cardiomegaly. Uphill paraesophageal varices. Hepatobiliary: Dependent gallstones in the gallbladder. The biliary stent is difficult to visualize and is probably best seen on the axial postcontrast images extending from the duodenum up to the biliary tree to the confluence of the right and left hepatic ducts. The stent is not well visualized extending into either the right or left bile duct, and seems to terminate in the vicinity of a central mass which is near the confluence of the right and left hepatic ducts. This mass has indistinct borders complicated by differences in the degree of enhancement of the left hepatic lobe in a right hepatic lobe, with delayed enhancement of the left hepatic lobe compared to the right, but is thought to measure at least about 4.0 by 1.7 cm on image 30 of series 17. There is mild right (particularly right posterior) and left intrahepatic biliary dilatation indicating that the stent may possibly not be fully traversing the mass, or may only be partially traversing the mass. The degree of left hepatic lobe biliary dilatation is mildly improved from 04/14/2022 but not resolved. No well-defined left portal vein enhancement, suspicious for left portal vein thrombosis. Pancreas: Mild pancreatic and peripancreatic edema, correlate with lipase levels to  assess for pancreatitis. Spleen: The spleen measures 12.5 by 5.3 by 9.9 cm (volume = 340 cm^3). No significant focal splenic lesion.  Adrenals/Urinary Tract:  Unremarkable Stomach/Bowel: Unremarkable Vascular/Lymphatic: Varices along the gastric fundus. Suspicion for left portal vein thrombosis as noted in the liver section above. The right portal vein, main portal vein, splenic vein, and SMV appear patent. Esophageal and gastric varices indicative of portal venous hypertension. Abdominal aortic atherosclerosis. Other:  Trace fluid in the left paracolic gutter. Musculoskeletal: Suspected superior endplate compression fracture at T8, not readily visible on 01/25/2022, likely subacute. IMPRESSION: 1. The biliary stent appears to extend from the duodenum to the vicinity of a central mass near the confluence of the right and left hepatic ducts. There is mild right and left intrahepatic biliary dilatation indicating that the stent may possibly not be fully traversing the mass, or may only be partially traversing the mass. The degree of left hepatic lobe biliary dilatation is mildly improved from 04/14/2022 but not resolved. 2. No well-defined enhancement in the left portal vein, suspicious for left portal vein thrombosis. 3. Mild pancreatic and peripancreatic edema, correlate with lipase levels to assess for pancreatitis. 4. Stable bilateral pulmonary nodules and right basilar atelectasis along with small right pleural effusion. Mild cardiomegaly. 5. Superior endplate compression fracture at T8, not readily visible on 01/25/2022, likely subacute. 6. Uphill paraesophageal and gastric varices indicating portal venous hypertension. 7. Trace fluid in the left paracolic gutter. 8. Cholelithiasis. Electronically Signed   By: Van Clines M.D.   On: 04/20/2022 09:59   DG ERCP  Result Date: 04/18/2022 CLINICAL DATA:  Liver mass. EXAM: ERCP TECHNIQUE: Multiple spot images obtained with the fluoroscopic device and submitted for interpretation post-procedure. FLUOROSCOPY TIME: FLUOROSCOPY TIME 30 minutes, 40 seconds (434.4 mGy) COMPARISON:  CT abdomen  pelvis-04/13/2022; abdominal MRI-04/14/2022 FINDINGS: Ten spot intraoperative fluoroscopic images of the right upper abdominal quadrant during ERCP are provided for review Initial image demonstrates an ERCP probe overlying the right upper abdominal quadrant. Subsequent images demonstrate selective cannulation and opacification of the common bile duct which appears nondilated. There is tapered narrowing/subtotal occlusion of the common hepatic duct at the level of the biliary hilum with subsequent opacification of the left intrahepatic biliary system and ultimately with internal biliary stent placement. There is minimal opacification of the cystic duct with partial opacification of the gallbladder. IMPRESSION: ERCP with suspected malignant subtotal occlusion of the intrahepatic biliary tree at the level of the biliary hilum with subsequent biliary stent placement. These images were submitted for radiologic interpretation only. Please see the procedural report for the amount of contrast and the fluoroscopy time utilized. Electronically Signed   By: Sandi Mariscal M.D.   On: 04/18/2022 15:12   DG C-Arm 1-60 Min-No Report  Result Date: 04/18/2022 Fluoroscopy was utilized by the requesting physician.  No radiographic interpretation.   MR LIVER W WO CONTRAST  Result Date: 04/14/2022 CLINICAL DATA:  Metastatic rectal cancer with history of pulmonary and hepatic metastatic disease, with ongoing chemotherapy. New LFT abnormalities with elevated total bilirubin. EXAM: MRI ABDOMEN WITHOUT AND WITH CONTRAST TECHNIQUE: Multiplanar multisequence MR imaging of the abdomen was performed both before and after the administration of intravenous contrast. CONTRAST:  9.78mL GADAVIST GADOBUTROL 1 MMOL/ML IV SOLN COMPARISON:  04/13/2022 CT abdomen/pelvis. FINDINGS: Lower chest: Redemonstration of multiple left upper lobe solid pulmonary nodules up to 1.4 cm, not substantially changed. Redemonstration of sharply marginated geographic  consolidation at the right lung base, unchanged and compatible with postradiation change. Stable small  posterior right pleural effusion. Hepatobiliary: There is a hypoenhancing 2.7 x 1.9 cm central liver mass in the region of the biliary hilum (series 19/image 28), new from 09/24/2020 CT abdomen, in the same location as the hypermetabolic liver mass seen on 09/09/2019 PET-CT, compatible with recurrent liver metastasis. There is associated high-grade biliary stricture at the biliary hilum affecting the central right and left intrahepatic bile ducts (series 11/image 30). Moderate intrahepatic biliary ductal dilatation throughout the left liver lobe is chronic and similar to 09/24/2020 CT abdomen study with progressive atrophy of the left liver. Moderate intrahepatic biliary ductal dilatation in the posterior right liver and caudate lobes is worsened from 09/24/2020 CT abdomen. No hepatic steatosis. No additional liver masses. Multiple gallstones layering in the mildly distended gallbladder, largest 2.0 cm. No gallbladder wall thickening or pericholecystic fluid. Common bile duct diameter 4 mm. No choledocholithiasis. Pancreas: No pancreatic mass or duct dilation.  No pancreas divisum. Spleen: Normal size. No mass. Adrenals/Urinary Tract: Normal adrenals. No hydronephrosis. Normal kidneys with no renal mass. Stomach/Bowel: Normal non-distended stomach. Visualized small and large bowel is normal caliber, with no bowel wall thickening. Vascular/Lymphatic: Atherosclerotic nonaneurysmal abdominal aorta. Patent portal, splenic, hepatic and renal veins. No pathologically enlarged lymph nodes in the abdomen. Other: No abdominal ascites or focal fluid collection. Musculoskeletal: No aggressive appearing focal osseous lesions. IMPRESSION: 1. Hypoenhancing 2.7 x 1.9 cm central liver mass in the region of the biliary hilum, new from 09/24/2020 CT abdomen study, compatible with solitary recurrent liver metastasis. Associated  high-grade malignant biliary hilar stricture. Moderate intrahepatic biliary ductal dilatation in the posterior right liver and caudate lobes is worsened from 09/24/2020 CT abdomen study. Similar chronic moderate intrahepatic biliary ductal dilatation with associated lobar atrophy in the left liver lobe. 2. No additional sites of metastatic disease in the abdomen. 3. Stable left upper lobe pulmonary metastases. 4. Stable right lung base postradiation change and small posterior right pleural effusion. 5. Cholelithiasis. No evidence of choledocholithiasis. Common bile duct diameter 4 mm. These results will be called to the ordering clinician or representative by the Radiology Department at the imaging location. Electronically Signed   By: Ilona Sorrel M.D.   On: 04/14/2022 10:06   CT Abdomen Pelvis W Contrast  Result Date: 04/13/2022 CLINICAL DATA:  Elevated liver function studies. History of metastatic colorectal cancer. EXAM: CT ABDOMEN AND PELVIS WITH CONTRAST TECHNIQUE: Multidetector CT imaging of the abdomen and pelvis was performed using the standard protocol following bolus administration of intravenous contrast. RADIATION DOSE REDUCTION: This exam was performed according to the departmental dose-optimization program which includes automated exposure control, adjustment of the mA and/or kV according to patient size and/or use of iterative reconstruction technique. CONTRAST:  109mL OMNIPAQUE IOHEXOL 350 MG/ML SOLN COMPARISON:  CT 09/24/2020 FINDINGS: Lower chest: Stable 14 mm are left lung nodule on image number 4/4. Stable nodular mass in the right lower lobe measuring a maximum of 3.2 cm. 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 No new pulmonary nodules or pleural effusions. The heart is normal in size. No pericardial effusion. Hepatobiliary: Persistent/chronic intrahepatic biliary dilatation most notable in the left hepatic lobe. Vague area of low attenuation near  the porta hepatis measuring 15 mm on image 19/2. I do not see this for certain on the prior CT scan. Could not exclude a metastatic focus. MRI abdomen without and with contrast may be helpful for further evaluation. Numerous small rim calcified gallstones are noted the gallbladder but no findings for acute cholecystitis. No  common bile duct dilatation. Pancreas: No mass, inflammation or ductal dilatation. Spleen: Stable mild splenomegaly. 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 The portal vein is patent. Adrenals/Urinary Tract: The adrenal glands are normal. Both kidneys are normal. The bladder is unremarkable but is not well distended. Stomach/Bowel: The stomach, duodenum, small bowel and colon grossly normal. There are surgical changes from an APR with low anastomosis. Surgical changes also involving a right upper quadrant small bowel loop. Vascular/Lymphatic: The aorta is normal in caliber. Moderate atherosclerotic calcifications. The branch vessels are patent. The major venous structures are patent. No mesenteric or retroperitoneal or pelvic lymphadenopathy. Reproductive: The uterus and ovaries are unremarkable. Other: Stable postsurgical changes in the presacral space. No findings suspicious for recurrent tumor. Musculoskeletal: No significant bony findings. IMPRESSION: 1. Persistent/chronic intrahepatic biliary dilatation most notable in the left hepatic lobe. 2. Vague area of low attenuation near the porta hepatis. Could not exclude a metastatic focus. MRI abdomen without and with contrast may be helpful for further evaluation. 3. Cholelithiasis. 4. Stable postsurgical changes from an APR with low anastomosis. No findings suspicious for recurrent tumor. 5. Stable postsurgical changes in the presacral space. Electronically Signed   By: Marijo Sanes M.D.   On: 04/13/2022 13:11     Discharge Exam: Vitals:    05/03/22 2133 05/04/22 0603  BP: (!) 143/62 (!) 118/54  Pulse: 77 76  Resp: 14 14  Temp: 98.7 F (37.1 C) 98.1 F (36.7 C)  SpO2: 98% 99%   Vitals:   05/03/22 0417 05/03/22 1240 05/03/22 2133 05/04/22 0603  BP: (!) 131/54 117/63 (!) 143/62 (!) 118/54  Pulse: 73 73 77 76  Resp: 17 17 14 14   Temp: 97.7 F (36.5 C) 98.2 F (36.8 C) 98.7 F (37.1 C) 98.1 F (36.7 C)  TempSrc: Oral Oral Oral Oral  SpO2: 99% 98% 98% 99%  Weight: 94.9 kg   94.9 kg  Height:        General: Pt is alert, awake, not in acute distress Cardiovascular: RRR, S1/S2 +, no rubs, no gallops Respiratory: CTA bilaterally, no wheezing, no rhonchi Abdominal: Soft, NT, ND, bowel sounds + Extremities: no edema, no cyanosis    The results of significant diagnostics from this hospitalization (including imaging, microbiology, ancillary and laboratory) are listed below for reference.     Microbiology: Recent Results (from the past 240 hour(s))  Body fluid culture w Gram Stain     Status: None   Collection Time: 04/27/22  3:28 PM   Specimen: BILE; Body Fluid  Result Value Ref Range Status   Specimen Description   Final    BILE Performed at [facility] [address], [place], [place] [address]    Special Requests   Final    Normal Performed at [facility], [place] [address] [place], [place] [address]    Gram Stain   Final    NO WBC SEEN ABUNDANT GRAM NEGATIVE RODS FEW GRAM POSITIVE COCCI IN CHAINS RARE BUDDING YEAST SEEN Performed at [facility], [place] [address], [place], [place] [address]    Culture ABUNDANT ENTEROBACTER CLOACAE  Final   Report Status 04/29/2022 FINAL  Final   Organism ID, Bacteria ENTEROBACTER CLOACAE  Final      Susceptibility   Enterobacter cloacae - MIC*    CEFEPIME <=0.12 SENSITIVE Sensitive     CEFTAZIDIME <=1 SENSITIVE Sensitive     CIPROFLOXACIN <=0.25 SENSITIVE Sensitive  GENTAMICIN <=1 SENSITIVE Sensitive     IMIPENEM 1  SENSITIVE Sensitive     TRIMETH/SULFA <=20 SENSITIVE Sensitive     PIP/TAZO <=4 SENSITIVE Sensitive     * ABUNDANT ENTEROBACTER CLOACAE     Labs: BNP (last 3 results) Recent Labs    04/21/22 1835  BNP [id number]   Basic Metabolic Panel: Recent Labs  Lab 04/30/22 0500 05/01/22 0500 05/02/22 0500 05/03/22 0701 05/04/22 0601  NA 131* 131* 132* 136 135  K 3.8 3.9 3.4* 3.5 3.3*  CL 98 101 106 111 112*  CO2 24 23 20* 20* 19*  GLUCOSE 126* 120* 102* 88 94  BUN 15 13 17 14 12   CREATININE 0.59 0.53 0.54 0.58 0.52  CALCIUM 8.1* 7.8* 7.7* 7.6* 7.4*  MG 2.2 2.1 2.2 2.1 1.9   Liver Function Tests: Recent Labs  Lab 04/30/22 0500 05/01/22 0500 05/02/22 0500 05/03/22 0701 05/04/22 0601  AST 71* 61* 48* 56* 68*  ALT 55* 49* 43 40 45*  ALKPHOS 187* 149* 134* 112 115  BILITOT 20.4* 19.6* 16.3* 13.4* 12.0*  PROT 6.0* 5.4* 5.2* 4.9* 5.0*  ALBUMIN 2.3* 2.0* 1.8* 1.7* 1.6*   Recent Labs  Lab 05/01/22 0500  LIPASE 27   No results for input(s): "AMMONIA" in the last 168 hours. CBC: Recent Labs  Lab 05/01/22 0500 05/02/22 0500 05/03/22 0701 05/03/22 1550 05/04/22 0601  WBC 15.5* 6.5 3.7* 3.9* 4.2  NEUTROABS 13.5* 5.2 2.8 3.2 3.0  HGB 8.8* 7.6* 7.2* 7.8* 7.4*  HCT 26.9* 23.5* 22.9* 24.4* 23.4*  MCV 107.2* 108.8* 111.2* 109.4* 110.4*  PLT 108* 85* 92* 113* 113*   Cardiac Enzymes: No results for input(s): "CKTOTAL", "CKMB", "CKMBINDEX", "TROPONINI" in the last 168 hours. BNP: Invalid input(s): "POCBNP" CBG: No results for input(s): "GLUCAP" in the last 168 hours. D-Dimer No results for input(s): "DDIMER" in the last 72 hours. Hgb A1c No results for input(s): "HGBA1C" in the last 72 hours. Lipid Profile No results for input(s): "CHOL", "HDL", "LDLCALC", "TRIG", "CHOLHDL", "LDLDIRECT" in the last 72 hours. Thyroid function studies No results for input(s): "TSH", "T4TOTAL", "T3FREE", "THYROIDAB" in the last 72 hours.  Invalid input(s): "FREET3" Anemia work up Recent  Labs    05/03/22 1550  VITAMINB12 342  FOLATE 10.9   Urinalysis    Component Value Date/Time   COLORURINE YELLOW 01/04/2015 [place] 01/04/2015 1123   LABSPEC 1.025 01/04/2015 1123   PHURINE 5.0 01/04/2015 1123   GLUCOSEU NEGATIVE 01/04/2015 1123   HGBUR NEGATIVE 01/04/2015 [place] 01/04/2015 1123   KETONESUR NEGATIVE 01/04/2015 1123   UROBILINOGEN 0.2 01/04/2015 1123   NITRITE NEGATIVE 01/04/2015 [place] 01/04/2015 1123   Sepsis Labs Recent Labs  Lab 05/02/22 0500 05/03/22 0701 05/03/22 1550 05/04/22 0601  WBC 6.5 3.7* 3.9* 4.2   Microbiology Recent Results (from the past 240 hour(s))  Body fluid culture w Gram Stain     Status: None   Collection Time: 04/27/22  3:28 PM   Specimen: BILE; Body Fluid  Result Value Ref Range Status   Specimen Description   Final    BILE Performed at [facility], [place] [address] [place], [place] [address]    Special Requests   Final    Normal Performed at [facility] [place] [address], [place], [place] [address]    Gram Stain   Final    NO WBC SEEN ABUNDANT GRAM NEGATIVE RODS FEW GRAM POSITIVE COCCI IN  CHAINS RARE BUDDING YEAST SEEN Performed at [facility], [place] [address] [place], [place] [address]    Culture ABUNDANT ENTEROBACTER CLOACAE  Final   Report Status 04/29/2022 FINAL  Final   Organism ID, Bacteria ENTEROBACTER CLOACAE  Final      Susceptibility   Enterobacter cloacae - MIC*    CEFEPIME <=0.12 SENSITIVE Sensitive     CEFTAZIDIME <=1 SENSITIVE Sensitive     CIPROFLOXACIN <=0.25 SENSITIVE Sensitive     GENTAMICIN <=1 SENSITIVE Sensitive     IMIPENEM 1 SENSITIVE Sensitive     TRIMETH/SULFA <=20 SENSITIVE Sensitive     PIP/TAZO <=4 SENSITIVE Sensitive     * ABUNDANT ENTEROBACTER CLOACAE     Time coordinating discharge: Over 30 minutes  SIGNED:   Darliss Cheney, MD  [facility] 05/04/2022, 10:09  AM *Please note that this is a verbal dictation therefore any spelling or grammatical errors are due to the "[employer] One" system interpretation. If 7PM-7AM, please contact night-coverage [email or web address]

## 2022-05-04 NOTE — Progress Notes (Signed)
Referring Physician(s): Ladell Pier  Supervising Physician: Michaelle Birks  Patient Status:  [facility] - In-pt  Chief Complaint:  Back pain, jaundice  Subjective: Pt being dc'd home today with biliary drain in place; care instructions reviewed with pt; denies fever,N/V or worsening abd pain; has some mild back pain; remains jaundiced   Allergies: Epinephrine, Losartan, Latex, and Vectibix [panitumumab]  Medications: Prior to Admission medications   Medication Sig Start Date End Date Taking? Authorizing Provider  albuterol (PROVENTIL HFA) 108 (90 Base) MCG/ACT inhaler Inhale 2 puffs into the lungs every 6 (six) hours as needed for wheezing or shortness of breath. 08/03/20  Yes Owens Shark, NP  bisacodyl (DULCOLAX) 5 MG EC tablet Take 5 mg by mouth daily as needed for mild constipation or moderate constipation.   Yes [provider]  ciprofloxacin (CIPRO) 500 MG tablet Take 1 tablet (500 mg total) by mouth 2 (two) times daily for 5 days. 05/04/22 05/09/22 Yes Darliss Cheney, MD  levothyroxine (SYNTHROID) 50 MCG tablet Take 1 tablet by mouth once daily Patient taking differently: Take 50 mcg by mouth daily before breakfast. 02/06/22  Yes Ladell Pier, MD  lidocaine-prilocaine (EMLA) cream Apply 1 Application topically as needed. Apply to port site 1-2 hours prior to use Patient taking differently: Apply 1 Application topically as needed (for port access- one to two hours prior to accessing). 12/12/21  Yes Owens Shark, NP  magic mouthwash SOLN Take 5 mLs by mouth 4 (four) times daily as needed for mouth pain. 5 ml swish and spit 4 times a daily as needed for mouth pain 09/14/20  Yes Ladell Pier, MD  magnesium oxide (MAG-OX) 400 (240 Mg) MG tablet Take 1 tablet (400 mg total) by mouth 2 (two) times daily. 02/22/22  Yes Ladell Pier, MD  metroNIDAZOLE (FLAGYL) 500 MG tablet Take 1 tablet (500 mg total) by mouth 3 (three) times daily for 5 days. 05/04/22 05/09/22 Yes Pahwani,  Einar Grad, MD  Nutritional Supplements (ENSURE ORIGINAL) LIQD Take 237 mLs by mouth See admin instructions. Drink 237 ml's (1 CHOCOLATE shake) by mouth once a day   Yes [provider]  potassium chloride (MICRO-K) 10 MEQ CR capsule Take 10 mEq by mouth daily.   Yes [provider]  predniSONE (DELTASONE) 5 MG tablet Take 1 tablet by mouth once daily with breakfast 03/15/22  Yes Ladell Pier, MD  prochlorperazine (COMPAZINE) 10 MG tablet Take 1 tablet (10 mg total) by mouth every 6 (six) hours as needed for nausea or vomiting. 03/01/21  Yes Owens Shark, NP  SYSTANE ULTRA PF 0.4-0.3 % SOLN Place 1 drop into both eyes 3 (three) times daily as needed (for dryness).   Yes [provider]  nystatin (MYCOSTATIN/NYSTOP) powder Apply 1 Application topically 3 (three) times daily. Patient not taking: Reported on 04/17/2022 12/12/21   Owens Shark, NP  Spacer/Aero-Holding Chambers (AEROCHAMBER MV) inhaler Use as instructed 12/31/19   Ladell Pier, MD  trifluridine-tipiracil (LONSURF) 20-8.19 MG tablet Take 2 tablets (40 mg Trifluridine) twice daily, take within 1 hr after AM & PM meals on days 1-5, 8-12. Repeat every 28 days. Start cycle on 04/10/22 Patient not taking: Reported on 04/17/2022 04/04/22   Ladell Pier, MD     Vital Signs: BP (!) 118/54 (BP Location: Right Arm)   Pulse 76   Temp 98.1 F (36.7 C) (Oral)   Resp 14   Ht 5\' 2"  (1.575 m)   Wt 209  lb 3.5 oz (94.9 kg)   LMP  (LMP Unknown)   SpO2 99%   BMI 38.27 kg/m   Physical Exam awake/alert; biliary drain intact, insertion site ok, OP yesterday 540 cc bile, no hemobilia; drain flushed without difficulty  Imaging: US Abdomen Limited RUQ (LIVER/GB)  Result Date: 05/02/2022 CLINICAL DATA:  Cholelithiasis EXAM: ULTRASOUND ABDOMEN LIMITED RIGHT UPPER QUADRANT COMPARISON:  CT abdomen and pelvis dated 05/01/2022 FINDINGS: Gallbladder: Mild gallbladder mural thickening measures 4 mm. Sludge and stones within the  gallbladder, predominantly lodged at the neck. No sonographic Murphy sign noted by sonographer. Partially imaged echogenic catheter near the hepatic hilum. Common bile duct: Diameter: 3 mm Liver: No focal lesion identified in the right hepatic lobe. Left hepatic lobe is suboptimally evaluated due to overlying bowel gas. Portal vein is patent on color Doppler imaging with normal direction of blood flow towards the liver. Other: None. IMPRESSION: 1. Sludge and stones within the gallbladder, predominantly lodged at the neck. Mild gallbladder mural thickening measures 4 mm. No sonographic Murphy sign noted by sonographer. Findings are equivocal for acute cholecystitis. 2. No extrahepatic or intrahepatic right biliary ductal dilation. Left hepatic lobe is suboptimally evaluated due to overlying bowel gas. Electronically Signed   By: Darrin Nipper M.D.   On: 05/02/2022 08:25   CT ABDOMEN W CONTRAST  Result Date: 05/01/2022 CLINICAL DATA:  Biliary obstruction. Metastatic colon cancer. Stent placement. * Tracking Code: BO * EXAM: CT ABDOMEN WITH CONTRAST TECHNIQUE: Multidetector CT imaging of the abdomen was performed using the standard protocol following bolus administration of intravenous contrast. RADIATION DOSE REDUCTION: This exam was performed according to the departmental dose-optimization program which includes automated exposure control, adjustment of the mA and/or kV according to patient size and/or use of iterative reconstruction technique. CONTRAST:  167mL OMNIPAQUE IOHEXOL 300 MG/ML  SOLN COMPARISON:  04/25/2022 CT FINDINGS: Lower chest: The majority of the chest was scanned as part of the examination from the level of the aortic arch down through the diaphragm. Chest port is seen. Heart is nonenlarged. Scattered vascular calcifications. 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 Slightly patulous esophagus. Lung nodules are identified. Example left upper lobe measuring 15 by 11 mm on series 6,  image 28. There are additional larger areas along the right lower lobe but partially obscured by the adjacent lung opacity and small right pleural effusion. Dedicated chest CT may be useful to further delineate these findings in correlation to the patient's history of metastatic disease from colon cancer. Hepatobiliary: There has been placement of a right-sided indwelling PTC catheter with has distal pigtail extending into the second portion of the duodenal distally. There is a small subcapsular fluid collection just superior to the entrance site of the catheter on series 2, image 28 which is new from previous measuring 16 by 6 mm. There are areas of biliary ductal dilatation identified particularly left hepatic lobe. These are similar to slightly improved from the prior CT scan. No new areas of intrahepatic biliary ductal dilatation. Gallbladder once again is severely dilated with stones. There is also some wall thickening and edema today which is progressive from the prior examination. Central small liver mass identified in segment 4 on series 2, image 26 measuring 14 by 13 mm. Pancreas: Unremarkable. No pancreatic ductal dilatation or surrounding inflammatory changes. Spleen: Spleen is enlarged with a length of 13.9 cm. Previously 14.4 cm, similar. Adrenals/Urinary Tract: The adrenal glands are preserved. Kidneys are unremarkable. Stomach/Bowel: Visualized bowel is nondilated. Slight wall thickening along the  duodenal, nonspecific. Surgical changes along the loops of bowel in the right lower quadrant. Vascular/Lymphatic: Normal caliber aorta and IVC with some scattered vascular calcifications. There is a prominent lymph node along the portacaval region measuring 16 by 10 mm, mildly enlarged. In retrospect unchanged from prior. Few other adjacent small prominent nodes identified as well. Other: Mild free fluid.  No free intra-air. Musculoskeletal: Degenerative changes seen along the spine. IMPRESSION: Indwelling  right-sided PTC catheter. The level of the intrahepatic biliary duct dilatation is similar to slightly decreased. New small subcapsular fluid collection along the liver just superior to the entrance site of the PTC catheter. Dilated gallbladder with stones. There is progressive wall thickening and wall edema. Please correlate with any clinical findings of acute cholecystitis. If needed dedicated workup such as HIDA scan could be considered Stable appearance of the intrinsic liver mass segment 4 as well as mildly enlarged porta hepatic lymph node. 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 Trace ascites. Lung masses identified. Please correlate for history of metastatic disease to the chest. If needed additional chest CT dedicated workup can be performed when clinically appropriate. Persistent small right pleural effusion. Electronically Signed   By: Jill Side M.D.   On: 05/01/2022 11:05    Labs:  CBC: Recent Labs    05/02/22 0500 05/03/22 0701 05/03/22 1550 05/04/22 0601  WBC 6.5 3.7* 3.9* 4.2  HGB 7.6* 7.2* 7.8* 7.4*  HCT 23.5* 22.9* 24.4* 23.4*  PLT 85* 92* 113* 113*    COAGS: Recent Labs    04/17/22 0841 04/24/22 0500 04/30/22 0500  INR 1.1 1.2 1.4*    BMP: Recent Labs    05/01/22 0500 05/02/22 0500 05/03/22 0701 05/04/22 0601  NA 131* 132* 136 135  K 3.9 3.4* 3.5 3.3*  CL 101 106 111 112*  CO2 23 20* 20* 19*  GLUCOSE 120* 102* 88 94  BUN 13 17 14 12   CALCIUM 7.8* 7.7* 7.6* 7.4*  CREATININE 0.53 0.54 0.58 0.52  GFRNONAA >60 >60 >60 >60    LIVER FUNCTION TESTS: Recent Labs    05/01/22 0500 05/02/22 0500 05/03/22 0701 05/04/22 0601  BILITOT 19.6* 16.3* 13.4* 12.0*  AST 61* 48* 56* 68*  ALT 49* 43 40 45*  ALKPHOS 149* 134* 112 115  PROT 5.4* 5.2* 4.9* 5.0*  ALBUMIN 2.0* 1.8* 1.7* 1.6*    Assessment and Plan: 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, biliary obstruction, mild GB wall thickening/stones; s/p  placement of right ext biliary drain 04/21/22 with subsequent removal and placement of new I/E rt biliary drain 04/26/22; the left intrahepatic biliary system was unable to be traversed percutaneously from the right-sided approach secondary to malignant obstruction of the level of the hilum; afebrile , WBC nl, hgb 7.4(7.8), K 3.3, creat nl, t bili 12(13.4); as OP rec once daily flush of biliary drain with 5 cc sterile saline, output recording and dressing change every 2-3 days; pt will be scheduled for f/u labs and cholangiogram with possible drain exchange/upsizing on 4/11 at [facility]; above d/w pt; pt given instructions regarding above   Electronically Signed: D. Rowe Robert, PA-C 05/04/2022, 11:04 AM   I spent a total of 15 Minutes at the the patient's bedside AND on the patient's hospital floor or unit, greater than 50% of which was counseling/coordinating care for biliary drain    Patient ID: Samantha Lutz, female   DOB: 12-17-1950, 72 y.o.   MRN: [id number]

## 2022-05-05 ENCOUNTER — Other Ambulatory Visit: Payer: Self-pay | Admitting: *Deleted

## 2022-05-05 ENCOUNTER — Other Ambulatory Visit: Payer: Self-pay

## 2022-05-05 DIAGNOSIS — C20 Malignant neoplasm of rectum: Secondary | ICD-10-CM

## 2022-05-05 NOTE — Progress Notes (Signed)
Per Dr. Truett Perna: Being d/c from hospital today and needs lab/flush/OV week of 4/8. Scheduling message sent.

## 2022-05-06 ENCOUNTER — Other Ambulatory Visit: Payer: Self-pay

## 2022-05-08 ENCOUNTER — Other Ambulatory Visit (HOSPITAL_COMMUNITY): Payer: Self-pay

## 2022-05-09 NOTE — Progress Notes (Signed)
FMLA papers completed for family member.  Faxed and received confirmation.    Fax no. [phone]

## 2022-05-10 ENCOUNTER — Inpatient Hospital Stay: Payer: Medicare Other | Admitting: Nurse Practitioner

## 2022-05-10 ENCOUNTER — Inpatient Hospital Stay: Payer: Medicare Other | Attending: Oncology

## 2022-05-10 ENCOUNTER — Inpatient Hospital Stay: Payer: Medicare Other

## 2022-05-10 ENCOUNTER — Encounter: Payer: Self-pay | Admitting: Nurse Practitioner

## 2022-05-10 ENCOUNTER — Other Ambulatory Visit: Payer: Self-pay | Admitting: Radiology

## 2022-05-10 VITALS — BP 120/52 | Temp 98.3°F | Resp 18 | Wt 196.2 lb

## 2022-05-10 DIAGNOSIS — K831 Obstruction of bile duct: Secondary | ICD-10-CM | POA: Diagnosis not present

## 2022-05-10 DIAGNOSIS — C20 Malignant neoplasm of rectum: Secondary | ICD-10-CM

## 2022-05-10 DIAGNOSIS — I864 Gastric varices: Secondary | ICD-10-CM | POA: Insufficient documentation

## 2022-05-10 DIAGNOSIS — C7801 Secondary malignant neoplasm of right lung: Secondary | ICD-10-CM | POA: Diagnosis not present

## 2022-05-10 DIAGNOSIS — C787 Secondary malignant neoplasm of liver and intrahepatic bile duct: Secondary | ICD-10-CM | POA: Insufficient documentation

## 2022-05-10 DIAGNOSIS — E039 Hypothyroidism, unspecified: Secondary | ICD-10-CM | POA: Diagnosis not present

## 2022-05-10 DIAGNOSIS — K859 Acute pancreatitis without necrosis or infection, unspecified: Secondary | ICD-10-CM | POA: Insufficient documentation

## 2022-05-10 DIAGNOSIS — D6959 Other secondary thrombocytopenia: Secondary | ICD-10-CM | POA: Insufficient documentation

## 2022-05-10 DIAGNOSIS — C7802 Secondary malignant neoplasm of left lung: Secondary | ICD-10-CM | POA: Insufficient documentation

## 2022-05-10 DIAGNOSIS — Z95828 Presence of other vascular implants and grafts: Secondary | ICD-10-CM

## 2022-05-10 LAB — CMP (CANCER CENTER ONLY)
ALT: 48 U/L — ABNORMAL HIGH (ref 0–44)
AST: 82 U/L — ABNORMAL HIGH (ref 15–41)
Albumin: 2.9 g/dL — ABNORMAL LOW (ref 3.5–5.0)
Alkaline Phosphatase: 197 U/L — ABNORMAL HIGH (ref 38–126)
Anion gap: 8 (ref 5–15)
BUN: 9 mg/dL (ref 8–23)
CO2: 23 mmol/L (ref 22–32)
Calcium: 8.1 mg/dL — ABNORMAL LOW (ref 8.9–10.3)
Chloride: 109 mmol/L (ref 98–111)
Creatinine: 0.55 mg/dL (ref 0.44–1.00)
GFR, Estimated: >60 mL/min (ref >60)
Glucose, Bld: 115 mg/dL — ABNORMAL HIGH (ref 70–99)
Potassium: 3.1 mmol/L — ABNORMAL LOW (ref 3.5–5.1)
Sodium: 140 mmol/L (ref 135–145)
Total Bilirubin: 11.8 mg/dL (ref 0.3–1.2)
Total Protein: 5.4 g/dL — ABNORMAL LOW (ref 6.5–8.1)

## 2022-05-10 LAB — CBC WITH DIFFERENTIAL (CANCER CENTER ONLY)
Abs Immature Granulocytes: 0.03 10*3/uL (ref 0.00–0.07)
Basophils Absolute: 0 10*3/uL (ref 0.0–0.1)
Basophils Relative: 0 %
Eosinophils Absolute: 0 10*3/uL (ref 0.0–0.5)
Eosinophils Relative: 1 %
HCT: 27.9 % — ABNORMAL LOW (ref 36.0–46.0)
Hemoglobin: 9.2 g/dL — ABNORMAL LOW (ref 12.0–15.0)
Immature Granulocytes: 1 %
Lymphocytes Relative: 15 %
Lymphs Abs: 0.5 10*3/uL — ABNORMAL LOW (ref 0.7–4.0)
MCH: 35.5 pg — ABNORMAL HIGH (ref 26.0–34.0)
MCHC: 33 g/dL (ref 30.0–36.0)
MCV: 107.7 fL — ABNORMAL HIGH (ref 80.0–100.0)
Monocytes Absolute: 0.5 10*3/uL (ref 0.1–1.0)
Monocytes Relative: 13 %
Neutro Abs: 2.4 10*3/uL (ref 1.7–7.7)
Neutrophils Relative %: 70 %
Platelet Count: 104 10*3/uL — ABNORMAL LOW (ref 150–400)
RBC: 2.59 MIL/uL — ABNORMAL LOW (ref 3.87–5.11)
RDW: 16.8 % — ABNORMAL HIGH (ref 11.5–15.5)
WBC Count: 3.5 10*3/uL — ABNORMAL LOW (ref 4.0–10.5)
nRBC: 0 % (ref 0.0–0.2)

## 2022-05-10 LAB — SAMPLE TO BLOOD BANK

## 2022-05-10 MED ORDER — SODIUM CHLORIDE 0.9% FLUSH
10.0000 mL | Freq: Once | INTRAVENOUS | Status: AC
  Administered 2022-05-10: 10 mL

## 2022-05-10 MED ORDER — HEPARIN SOD (PORK) LOCK FLUSH 100 UNIT/ML IV SOLN
500.0000 [IU] | Freq: Once | INTRAVENOUS | Status: AC
  Administered 2022-05-10: 500 [IU]

## 2022-05-10 MED ORDER — OXYCODONE HCL 5 MG PO TABS
5.0000 mg | ORAL_TABLET | ORAL | 0 refills | Status: DC | PRN
Start: 2022-05-10 — End: 2022-06-01

## 2022-05-10 NOTE — Patient Instructions (Signed)

## 2022-05-10 NOTE — Progress Notes (Signed)
[facility] OFFICE PROGRESS NOTE   Diagnosis: Colon cancer  INTERVAL HISTORY:   Samantha Lutz returns for follow-up.  Energy level is poor.  She feels weak.  No fever.  Appetite is poor.  She has occasional nausea.  No shortness of breath.  Objective:  Vital signs in last 24 hours:  Blood pressure (!) 120/52, temperature 98.3 F (36.8 C), temperature source Oral, resp. rate 18, weight 196 lb 3.2 oz (89 kg), SpO2 100 %.    HEENT: Scleral icterus. Resp: Lungs clear bilaterally. Cardio: Regular rate and rhythm. GI: Abdomen soft and nontender.  Biliary drainage catheter with collection bag; drainage present in the bag and on the dressing.  Site appears unremarkable. Vascular: No leg edema. Neuro: Alert and oriented. Skin: Jaundice. Port-A-Cath without erythema.  Lab Results:  Lab Results  Component Value Date   WBC 3.5 (L) 05/10/2022   HGB 9.2 (L) 05/10/2022   HCT 27.9 (L) 05/10/2022   MCV 107.7 (H) 05/10/2022   PLT 104 (L) 05/10/2022   NEUTROABS 2.4 05/10/2022    Imaging:  No results found.  Medications: I have reviewed the patient's current medications.  Assessment/Plan: Rectal cancer Mass at 7 cm from the anal verge on colonoscopy 08/09/2017, biopsy revealed invasive adenocarcinoma Staging CTs 08/17/2017-no evidence of metastatic disease, asymmetric thickening in the mid rectum MR pelvis 09/01/2017, T3N0 lesion beginning at 6.3 cm from the anal sphincter Radiation/Xeloda initiated 09/17/2017, completed 10/25/2017 Low anterior resection/diverting ileostomy 12/21/2017,ypT3,ypN1a tumor.  Lymphovascular invasion present, intact mismatch repair protein expression, treatment effect present (TRS 1).  Mismatch repair protein IHC normal; Foundation 1-KRAS/NRAS wild-type, microsatellite status and tumor mutational burden could not be determined. Cycle 1 adjuvant Xeloda beginning 01/21/2018 Cycle 2 adjuvant Xeloda beginning 02/11/2018 Xeloda discontinued after cycle  2 secondary to patient preference Ileostomy takedown 07/10/2018 CTs 09/07/2018- multiple live small pulmonary nodules concerning for metastatic disease CT chest 12/10/2018-multiple bilateral lung nodules, some have increased in size CT chest 04/08/2019-mild enlargement of bilateral lung nodules Status post SBRT multiple lung nodules 05/06/2019, 05/08/2019, 05/13/2019 CT chest 08/28/2019-improvement and resolution in majority of right-sided pulmonary nodules, a superior segment right lower lobe nodule has increased, no new right-sided nodules.  Progressive enlargement of left-sided pulmonary nodules PET scan 09/09/2019-hypermetabolic right lower lobe nodule, 2 hypermetabolic left upper lobe nodules with an additional 0.7 cm left upper lobe nodule below PET resolution, groundglass opacity in the mid to right lower lobe with associated hypermetabolism consistent with postradiation change, hypermetabolic central segment 4A liver lesion Cycle 1 FOLFOX 10/14/2019 Cycle  2FOLFOX 11/11/2019, 5-FU and oxaliplatin dose reduced secondary to neutropenia and thrombocytopenia following cycle one, G-CSF declined Cycle 3 FOLFOX 11/26/2019 Cycle 4 FOLFOX 12/11/2019, oxaliplatin held due to neutropenia and thrombocytopenia Cycle 5 FOLFOX 12/31/2019 Cycle 6 FOLFOX 01/14/2020 (oxaliplatin held, 5-fluorouracil dose reduced due to mucositis) CT chest 01/27/2020-improvement in left lung nodules, progressive airspace disease with traction bronchiectasis throughout the right lung, previously noted right lung nodules are obscured, mildly enlarged right paratracheal lymph nodes-not hypermetabolic on prior PET, likely reactive Cycle 7 FOLFOX 01/28/2020 Cycle 8 FOLFOX 02/11/2020 (oxaliplatin held due to neutropenia and thrombocytopenia) Cycle 9 FOLFOX 02/25/2020 (oxaliplatin held due to thrombocytopenia) Cycle 10 FOLFOX 03/11/2020 (oxaliplatin held secondary to neuropathy) Cycle 11 FOLFOX 03/25/2020 (oxaliplatin held due to neuropathy) CT  chest 04/05/2020-no new or progressive findings.  Interval evolution of presumed postradiation scarring in the right perihilar lung with decrease in the more diffuse groundglass opacity seen previously.  No substantial change in left lung nodules. 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  No definite CT findings to suggest recurrent tumor.  Stable small left upper lobe pulmonary nodules.  No new or progressive findings.  Stable small right paratracheal lymph nodes.  Stable area of irregular low-attenuation in hepatic segment 4A, site of known prior hepatic metastatic lesion.  No new or progressive findings. Cycle 18 5-fluorouracil 07/06/2020 Cycle 19 5-fluorouracil 07/20/2020 Cycle 20 5-fluorouracil 08/03/2020 Cycle 21 5-fluorouracil 08/17/2020  Cycle 22 5-fluorouracil 08/31/2020 Cycle 23 5-fluorouracil 09/14/2020 CT chest 09/24/2020-increased number and size of pulmonary nodules bilaterally.  Findings in the right upper lobe are concerning for potential lymphangitic spread of tumor.  Right upper lobe findings could also reflect evolving postradiation change. Cycle 1 FOLFIRI 10/12/2020 Cycle 2 FOLFIRI 11/08/2020, irinotecan dose reduced due to neutropenia and thrombocytopenia following cycle 1 Cycle 3 FOLFIRI 12/01/2020 Cycle 4 FOLFIRI 12/22/2020 CT chest 01/11/2021-similar bilateral pulmonary nodules.  Left upper lobe nodule has mildly decreased in size.  No new nodules. Cycle 5 FOLFIRI 01/12/2021 Cycle 6 FOLFIRI 02/03/2021 Cycle 7 FOLFIRI 03/01/2021 Cycle 8 FOLFIRI 03/22/2021 CT chest 04/08/2021-multiple small bilateral lung nodules not significantly changed.  No new nodules.  Unchanged enlarged pretracheal nodes.   Increase in fibrotic consolidation of the perihilar and inferior right lung. Cycle 9 FOLFIRI 04/12/2021 05/04/2021 treatment held due to fatigue Cycle 10 FOLFIRI 05/18/2021 06/07/2021-treatment held secondary to neutropenia Cycle 11 FOLFIRI 06/14/2021 Cycle 12 FOLFIRI 07/05/2021 Cycle 13 FOLFIRI 07/27/2021, irinotecan held 14 FOLFIRI 08/16/2021, irinotecan held CT chest 09/02/2021-enlargement of several lung nodules, no new nodules, stable postradiation changes in the right lower lobe and right middle lobe, gastroesophageal varices identified Cycle one 5-FU/Panitumumab 11/01/2021 Cycle two 5-FU/panitumumab 11/29/2021-Solu-Medrol and Pepcid prophylaxis added after she developed a cough during the panitumumab with cycle 1 Cycle 3 5-FU/panitumumab 12/12/2021 Cycle four 5-FU/panitumumab 12/27/2021 CT chest 01/25/2022-mild progression of metastatic disease to the lungs with increased size but stable number of metastatic lesions. Cycle 1 Lonsurf 02/27/2022 Cycle 2 Lonsurf 04/10/2022, placed on hold 04/13/2022 due to LFT abnormalities CT abdomen/pelvis 04/13/2022-persistent/chronic intrahepatic biliary dilatation most notable left hepatic lobe; vague area of low-attenuation near the porta hepatis, MRI recommended. MRI liver 04/14/2022-hypoenhancing 2.7 x 1.9 cm central liver mass in the region of the biliary hilum.  Associated high-grade biliary stricture at the biliary hilum affecting the central right and left intrahepatic bile ducts.       2.   Hypothyroid   3.    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  Port-A-Cath placement 09/29/2019, interventional radiology   5.  Neutropenia and thrombocytopenia following cycle 1 FOLFOX-plan chemotherapy dose reductions, she declined G-CSF   6.  Mucositis following cycle 5 FOLFOX, 5-fluorouracil dose reduced with cycle 6 Mucositis following cycle 22 5-fluorouracil-Magic mouthwash added   7.  Right lung airspace disease/volume  loss-likely toxicity from chest radiation, trial of prednisone 01/29/2020; dyspnea and cough improved 02/11/2020 Progressive cough 03/11/2020-prednisone resumed at a dose of 20 mg daily Cough and dyspnea improved 03/25/2020-prednisone taper to 15 mg daily Improved 04/06/2020-prednisone taper to 10 mg daily Stable 04/21/2020-prednisone taper to 5 mg daily   8.  Gastroesophageal varices    9.   Admission 04/17/2022 with obstructive jaundice secondary to a central liver mass ERCP 04/18/2022-biliary obstruction secondary to a central liver mass,  status post stent placement MRI/MRCP-paraesophageal varices, or a stent extending from the duodenum to the confluence of the right and left hepatic duct stent does not extend into either the right or left duct, 4 x 1.7 cm central liver mass, suspicion for left portal vein thrombosis, mild pancreatic and peripancreatic edema, esophagus and gastric varices EGD removal of biliary stent 04/21/2022 Placement of right external biliary drain 04/21/2022 Placement of new right external/internal biliary drain 04/26/2022   10.  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  She has a biliary obstruction.  She has had multiple biliary stents placed.  Most recent drain was placed 04/26/2022.  Bilirubin appeared to be trending down on day of discharge, 05/04/2022.  Bilirubin today is 11.8, unchanged as compared to 05/04/2022.  She has an appointment with interventional radiology tomorrow.  She understands systemic therapy will remain on hold until the bilirubin is less than 2.  She will return for lab and follow-up in 2 weeks.  We are available to see her sooner if needed.  She understands to contact the office if she develops fever or chills.  Patient seen with Dr. Truett PernaSherrill.    Samantha Lutz ANP/GNP-BC   05/10/2022  3:18 PM This was a shared visit with Samantha Lutz.   Ms. Samantha Lutz was interviewed and examined.  She has persistent hyperbilirubinemia despite placement of several bile duct drains.  She is scheduled for evaluation in interventional radiology tomorrow.  Hopefully a new drain can be placed to relieve the biliary obstruction.  She understands we cannot deliver further systemic therapy unless the hyperbilirubinemia improves.  I was present for greater than 50% of today's visit I performed medical decision making.  Samantha BaleBrad Sherrill, MD

## 2022-05-11 ENCOUNTER — Ambulatory Visit (HOSPITAL_COMMUNITY)
Admit: 2022-05-11 | Discharge: 2022-05-11 | Disposition: A | Discharge status: Home / Self Care | Payer: Medicare Other | Attending: Radiology | Admitting: Radiology

## 2022-05-11 ENCOUNTER — Encounter (HOSPITAL_COMMUNITY): Payer: Self-pay

## 2022-05-11 ENCOUNTER — Ambulatory Visit (HOSPITAL_COMMUNITY)
Admission: RE | Admit: 2022-05-11 | Discharge: 2022-05-11 | Disposition: A | Discharge status: Home / Self Care | Payer: Medicare Other | Source: Ambulatory Visit | Attending: Radiology | Admitting: Radiology

## 2022-05-11 ENCOUNTER — Other Ambulatory Visit: Payer: Self-pay | Admitting: Radiology

## 2022-05-11 ENCOUNTER — Other Ambulatory Visit: Payer: Self-pay

## 2022-05-11 DIAGNOSIS — K831 Obstruction of bile duct: Secondary | ICD-10-CM | POA: Diagnosis present

## 2022-05-11 DIAGNOSIS — Z85048 Personal history of other malignant neoplasm of rectum, rectosigmoid junction, and anus: Secondary | ICD-10-CM | POA: Diagnosis not present

## 2022-05-11 DIAGNOSIS — C787 Secondary malignant neoplasm of liver and intrahepatic bile duct: Secondary | ICD-10-CM | POA: Diagnosis not present

## 2022-05-11 HISTORY — PX: IR CHOLANGIOGRAM EXISTING TUBE: IMG6040

## 2022-05-11 HISTORY — PX: IR BILIARY DRAIN PLACEMENT WITH CHOLANGIOGRAM: IMG6043

## 2022-05-11 HISTORY — PX: IR EXCHANGE BILIARY DRAIN: IMG6046

## 2022-05-11 LAB — CBC WITH DIFFERENTIAL/PLATELET
Abs Immature Granulocytes: 0.04 10*3/uL (ref 0.00–0.07)
Basophils Absolute: 0 10*3/uL (ref 0.0–0.1)
Basophils Relative: 0 %
Eosinophils Absolute: 0 10*3/uL (ref 0.0–0.5)
Eosinophils Relative: 1 %
HCT: 26.9 % — ABNORMAL LOW (ref 36.0–46.0)
Hemoglobin: 8.6 g/dL — ABNORMAL LOW (ref 12.0–15.0)
Immature Granulocytes: 1 %
Lymphocytes Relative: 12 %
Lymphs Abs: 0.4 10*3/uL — ABNORMAL LOW (ref 0.7–4.0)
MCH: 35.5 pg — ABNORMAL HIGH (ref 26.0–34.0)
MCHC: 32 g/dL (ref 30.0–36.0)
MCV: 111.2 fL — ABNORMAL HIGH (ref 80.0–100.0)
Monocytes Absolute: 0.4 10*3/uL (ref 0.1–1.0)
Monocytes Relative: 14 %
Neutro Abs: 2.2 10*3/uL (ref 1.7–7.7)
Neutrophils Relative %: 72 %
Platelets: 97 10*3/uL — ABNORMAL LOW (ref 150–400)
RBC: 2.42 MIL/uL — ABNORMAL LOW (ref 3.87–5.11)
RDW: 16.4 % — ABNORMAL HIGH (ref 11.5–15.5)
WBC: 3.1 10*3/uL — ABNORMAL LOW (ref 4.0–10.5)
nRBC: 0 % (ref 0.0–0.2)

## 2022-05-11 LAB — COMPREHENSIVE METABOLIC PANEL
ALT: 44 U/L (ref 0–44)
AST: 76 U/L — ABNORMAL HIGH (ref 15–41)
Albumin: 2 g/dL — ABNORMAL LOW (ref 3.5–5.0)
Alkaline Phosphatase: 157 U/L — ABNORMAL HIGH (ref 38–126)
Anion gap: 8 (ref 5–15)
BUN: 10 mg/dL (ref 8–23)
CO2: 24 mmol/L (ref 22–32)
Calcium: 7.8 mg/dL — ABNORMAL LOW (ref 8.9–10.3)
Chloride: 108 mmol/L (ref 98–111)
Creatinine, Ser: 0.58 mg/dL (ref 0.44–1.00)
GFR, Estimated: >60 mL/min (ref >60)
Glucose, Bld: 104 mg/dL — ABNORMAL HIGH (ref 70–99)
Potassium: 2.8 mmol/L — ABNORMAL LOW (ref 3.5–5.1)
Sodium: 140 mmol/L (ref 135–145)
Total Bilirubin: 9.9 mg/dL — ABNORMAL HIGH (ref 0.3–1.2)
Total Protein: 5.5 g/dL — ABNORMAL LOW (ref 6.5–8.1)

## 2022-05-11 LAB — PROTIME-INR
INR: 1.6 — ABNORMAL HIGH (ref 0.8–1.2)
Prothrombin Time: 19 seconds — ABNORMAL HIGH (ref 11.4–15.2)

## 2022-05-11 MED ORDER — FENTANYL CITRATE (PF) 100 MCG/2ML IJ SOLN
INTRAMUSCULAR | Status: AC
  Filled 2022-05-11: qty 2

## 2022-05-11 MED ORDER — SODIUM CHLORIDE 0.9 % IV SOLN
2.0000 g | Freq: Once | INTRAVENOUS | Status: AC
  Administered 2022-05-11: 2 g via INTRAVENOUS
  Filled 2022-05-11: qty 20

## 2022-05-11 MED ORDER — MIDAZOLAM HCL 2 MG/2ML IJ SOLN
INTRAMUSCULAR | Status: AC | PRN
  Administered 2022-05-11 (×5): 1 mg via INTRAVENOUS

## 2022-05-11 MED ORDER — IOHEXOL 300 MG/ML  SOLN
50.0000 mL | Freq: Once | INTRAMUSCULAR | Status: AC | PRN
  Administered 2022-05-11: 30 mL

## 2022-05-11 MED ORDER — OXYCODONE HCL 5 MG PO TABS
5.0000 mg | ORAL_TABLET | Freq: Once | ORAL | Status: AC
  Administered 2022-05-11: 5 mg via ORAL
  Filled 2022-05-11: qty 1

## 2022-05-11 MED ORDER — SODIUM CHLORIDE 0.9% FLUSH: 5.0000 mL | Freq: Three times a day (TID) | INTRAVENOUS | Status: DC

## 2022-05-11 MED ORDER — MIDAZOLAM HCL 2 MG/2ML IJ SOLN
INTRAMUSCULAR | Status: AC
  Filled 2022-05-11: qty 2

## 2022-05-11 MED ORDER — LIDOCAINE HCL 1 % IJ SOLN: 5.0000 mL | Freq: Once | INTRAMUSCULAR | Status: DC

## 2022-05-11 MED ORDER — SODIUM CHLORIDE 0.9 % IV SOLN: INTRAVENOUS | Status: DC

## 2022-05-11 MED ORDER — HEPARIN SOD (PORK) LOCK FLUSH 100 UNIT/ML IV SOLN
500.0000 [IU] | Freq: Once | INTRAVENOUS | Status: AC
  Administered 2022-05-11: 500 [IU] via INTRAVENOUS
  Filled 2022-05-11: qty 5

## 2022-05-11 MED ORDER — LIDOCAINE HCL 1 % IJ SOLN
INTRAMUSCULAR | Status: AC
  Filled 2022-05-11: qty 20

## 2022-05-11 MED ORDER — FENTANYL CITRATE (PF) 100 MCG/2ML IJ SOLN
INTRAMUSCULAR | Status: AC | PRN
  Administered 2022-05-11 (×5): 50 ug via INTRAVENOUS

## 2022-05-11 NOTE — H&P (Signed)
Chief Complaint: Patient was seen in consultation today for bili drain exchange   Referring Physician(s): Allred,Darrell K  Supervising Physician: Simonne Come  Patient Status: [facility] - Out-pt  History of Present Illness: Samantha Lutz is a 72 y.o. 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 Underwent (R)Ext bili drain placement on 3/22 followed by subsequent removal and placement of new (R) Int/Ext bili drain on 3/27, with improved T. Bili trends. She has developed some leakage of bili around the drain. She is scheduled for cholangiogram with intent to exchange/upsize bili drain.  PMHx, meds, labs, imaging, allergies reviewed. Feels well, no recent fevers, chills, illness. Has been NPO today as directed.   Past Medical History:  Diagnosis Date   Anemia    remote hx.   Arthritis    Bi lat knee   Cancer 07/2017   Rectal Cancer    Fatty liver    ultrasound December 2012   Hypothyroid 01/16/2013 dx   Irregular heartbeat    PER PATIENT REPORT; ONSET 20+YEARS AGO SAW CARDIOLOGY AT THE TIME C/O "ITS BEAT REALLY FAST FOR A MINUTE AND THEN STOPPED " DENIES ANY OTHER CARDIAC SX';  REPORTS CARDIO DID ECHO WHICH WAS NEGATIVE;  NOW COMES AND GOES ;    Lichen sclerosus    steroids as needed   Temporary low platelet count    SEE LAST LABS IN EPIC    Past Surgical History:  Procedure Laterality Date   APPENDECTOMY  1967   BILIARY STENT PLACEMENT N/A 04/18/2022   Procedure: BILIARY STENT PLACEMENT;  Surgeon: Iva Boop, MD;  Location: [facility];  Service: Gastroenterology;  Laterality: N/A;   CESAREAN SECTION  1976 & 1983   COLONOSCOPY  08/09/2017   danis - polyps   ERCP N/A 04/18/2022   Procedure: ENDOSCOPIC RETROGRADE CHOLANGIOPANCREATOGRAPHY (ERCP);  Surgeon: Iva Boop, MD;  Location: [facility];  Service: Gastroenterology;  Laterality: N/A;   ESOPHAGOGASTRODUODENOSCOPY (EGD) WITH PROPOFOL N/A 04/21/2022   Procedure:  ESOPHAGOGASTRODUODENOSCOPY (EGD) WITH PROPOFOL;  Surgeon: Iva Boop, MD;  Location: [facility];  Service: Gastroenterology;  Laterality: N/A;   ILEOSTOMY CLOSURE N/A 07/10/2018   Procedure: ILEOSTOMY LOOP TAKEDOWN WITH TAP BLOCK;  Surgeon: Karie Soda, MD;  Location: [facility];  Service: General;  Laterality: N/A;   IR CONVERT BILIARY DRAIN TO INT EXT BILIARY DRAIN  04/26/2022   IR IMAGING GUIDED PORT INSERTION  09/29/2019   IR PERCUTANEOUS TRANSHEPATIC CHOLANGIOGRAM  04/21/2022   OSTOMY N/A 12/21/2017   Procedure: DIVERTING LOOP OSTOMY;  Surgeon: Karie Soda, MD;  Location: [facility];  Service: General;  Laterality: N/A;   PROCTOSCOPY N/A 12/21/2017   Procedure: RIGID PROCTOSCOPY;  Surgeon: Karie Soda, MD;  Location: [facility];  Service: General;  Laterality: N/A;   RADIOLOGY WITH ANESTHESIA N/A 04/26/2022   Procedure: PERCUTANEOUS TRANSHEPATIC  CHOLANGIOGRAM;  Surgeon: Simonne Come, MD;  Location: [facility];  Service: Anesthesiology;  Laterality: N/A;   STENT REMOVAL  04/21/2022   Procedure: STENT REMOVAL;  Surgeon: Iva Boop, MD;  Location: [facility];  Service: Gastroenterology;;   XI ROBOTIC ASSISTED LOWER ANTERIOR RESECTION N/A 12/21/2017   Procedure: XI ROBOTIC ASSISTED LOWER ANTERIOR RESECTION;  Surgeon: Karie Soda, MD;  Location: [facility];  Service: General;  Laterality: N/A;    Allergies: Epinephrine, Losartan, Latex, and Vectibix [panitumumab]  Medications: Prior to Admission medications   Medication Sig Start Date End Date Taking? Authorizing Provider  levothyroxine (SYNTHROID) 50 MCG tablet Take 1  tablet by mouth once daily Patient taking differently: Take 50 mcg by mouth daily before breakfast. 02/06/22  Yes Ladene Artist, MD  lidocaine-prilocaine (EMLA) cream Apply 1 Application topically as needed. Apply to port site 1-2 hours prior to use Patient taking differently: Apply 1 Application topically as needed (for port access- one to two hours prior to accessing).  12/12/21  Yes Rana Snare, NP  magnesium oxide (MAG-OX) 400 (240 Mg) MG tablet Take 1 tablet (400 mg total) by mouth 2 (two) times daily. 02/22/22  Yes Ladene Artist, MD  Nutritional Supplements (ENSURE ORIGINAL) LIQD Take 237 mLs by mouth See admin instructions. Drink 237 ml's (1 CHOCOLATE shake) by mouth once a day   Yes [provider]  oxyCODONE (OXY IR/ROXICODONE) 5 MG immediate release tablet Take 1 tablet (5 mg total) by mouth every 4 (four) hours as needed for severe pain. 05/10/22  Yes Rana Snare, NP  potassium chloride (MICRO-K) 10 MEQ CR capsule Take 10 mEq by mouth daily.   Yes [provider]  predniSONE (DELTASONE) 5 MG tablet Take 1 tablet by mouth once daily with breakfast 03/15/22  Yes Ladene Artist, MD  albuterol (PROVENTIL HFA) 108 (90 Base) MCG/ACT inhaler Inhale 2 puffs into the lungs every 6 (six) hours as needed for wheezing or shortness of breath. 08/03/20   Rana Snare, NP  bisacodyl (DULCOLAX) 5 MG EC tablet Take 5 mg by mouth daily as needed for mild constipation or moderate constipation.    [provider]  magic mouthwash SOLN Take 5 mLs by mouth 4 (four) times daily as needed for mouth pain. 5 ml swish and spit 4 times a daily as needed for mouth pain 09/14/20   Ladene Artist, MD  prochlorperazine (COMPAZINE) 10 MG tablet Take 1 tablet (10 mg total) by mouth every 6 (six) hours as needed for nausea or vomiting. 03/01/21   Rana Snare, NP  Spacer/Aero-Holding Chambers (AEROCHAMBER MV) inhaler Use as instructed 12/31/19   Ladene Artist, MD  SYSTANE ULTRA PF 0.4-0.3 % SOLN Place 1 drop into both eyes 3 (three) times daily as needed (for dryness).    [provider]     Family History  Problem Relation Age of Onset   Diabetes Mother    Hypertension Mother    COPD Mother    Glaucoma Father 22   Stroke Maternal Grandmother 33   Hypertension Maternal Grandmother    Rheum arthritis Sister 25   Hypertension Daughter     Breast cancer Cousin    Colon cancer Neg Hx    Esophageal cancer Neg Hx    Liver cancer Neg Hx    Pancreatic cancer Neg Hx    Stomach cancer Neg Hx    Rectal cancer Neg Hx     Social History   Socioeconomic History   Marital status: Widowed    Spouse name: Not on file   Number of children: Not on file   Years of education: Not on file   Highest education level: Not on file  Occupational History   Not on file  Tobacco Use   Smoking status: Never   Smokeless tobacco: Never  Vaping Use   Vaping Use: Never used  Substance and Sexual Activity   Alcohol use: No   Drug use: No   Sexual activity: Yes    Birth control/protection: Post-menopausal  Other Topics Concern   Not on file  Social History Narrative   Not on  file   Social Determinants of Health   Financial Resource Strain: Low Risk  (11/01/2021)   Overall Financial Resource Strain (CARDIA)    Difficulty of Paying Living Expenses: Not hard at all  Food Insecurity: No Food Insecurity (04/18/2022)   Hunger Vital Sign    Worried About Running Out of Food in the Last Year: Never true    Ran Out of Food in the Last Year: Never true  Transportation Needs: No Transportation Needs (04/18/2022)   PRAPARE - Administrator, Civil Service (Medical): No    Lack of Transportation (Non-Medical): No  Physical Activity: Not on file  Stress: Not on file  Social Connections: Socially Isolated (11/01/2021)   Social Connection and Isolation Panel [NHANES]    Frequency of Communication with Friends and Family: More than three times a week    Frequency of Social Gatherings with Friends and Family: More than three times a week    Attends Religious Services: Never    Database administrator or Organizations: No    Attends Banker Meetings: Never    Marital Status: Widowed    Review of Systems: A 12 point ROS discussed and pertinent positives are indicated in the HPI above.  All other systems are negative.  Review  of Systems  Vital Signs: BP (!) 124/56   Pulse 79   Temp 98.4 F (36.9 C) (Oral)   Resp 18   Ht 5\' 2"  (1.575 m)   Wt 196 lb 3.4 oz (89 kg)   LMP  (LMP Unknown)   SpO2 100%   BMI 35.89 kg/m   Physical Exam HENT:     Mouth/Throat:     Mouth: Mucous membranes are moist.     Pharynx: Oropharynx is clear.  Cardiovascular:     Rate and Rhythm: Normal rate and regular rhythm.     Heart sounds: Normal heart sounds.  Pulmonary:     Effort: Pulmonary effort is normal. No respiratory distress.     Breath sounds: Normal breath sounds.  Abdominal:     General: Abdomen is flat. There is no distension.     Palpations: Abdomen is soft.     Comments: RUQ/lateral bili drain intact. Site with some evidence of bile leakage and staining. Thin yellow bile in bag.   Skin:    General: Skin is dry.     Coloration: Skin is jaundiced.  Neurological:     General: No focal deficit present.     Mental Status: She is oriented to person, place, and time.  Psychiatric:        Thought Content: Thought content normal.        Judgment: Judgment normal.     Imaging: US Abdomen Limited RUQ (LIVER/GB)  Result Date: 05/02/2022 CLINICAL DATA:  Cholelithiasis EXAM: ULTRASOUND ABDOMEN LIMITED RIGHT UPPER QUADRANT COMPARISON:  CT abdomen and pelvis dated 05/01/2022 FINDINGS: Gallbladder: Mild gallbladder mural thickening measures 4 mm. Sludge and stones within the gallbladder, predominantly lodged at the neck. No sonographic Murphy sign noted by sonographer. Partially imaged echogenic catheter near the hepatic hilum. Common bile duct: Diameter: 3 mm Liver: No focal lesion identified in the right hepatic lobe. Left hepatic lobe is suboptimally evaluated due to overlying bowel gas. Portal vein is patent on color Doppler imaging with normal direction of blood flow towards the liver. Other: None. IMPRESSION: 1. Sludge and stones within the gallbladder, predominantly lodged at the neck. Mild gallbladder mural thickening  measures 4 mm. No sonographic Eulah Pont  sign noted by sonographer. Findings are equivocal for acute cholecystitis. 2. No extrahepatic or intrahepatic right biliary ductal dilation. Left hepatic lobe is suboptimally evaluated due to overlying bowel gas. Electronically Signed   By: Agustin Cree M.D.   On: 05/02/2022 08:25   CT ABDOMEN W CONTRAST  Result Date: 05/01/2022 CLINICAL DATA:  Biliary obstruction. Metastatic colon cancer. Stent placement. * Tracking Code: BO * EXAM: CT ABDOMEN WITH CONTRAST TECHNIQUE: Multidetector CT imaging of the abdomen was performed using the standard protocol following bolus administration of intravenous contrast. RADIATION DOSE REDUCTION: This exam was performed according to the departmental dose-optimization program which includes automated exposure control, adjustment of the mA and/or kV according to patient size and/or use of iterative reconstruction technique. CONTRAST:  OMNIPAQUE IOHEXOL 300 MG/ML  SOLN COMPARISON:  04/25/2022 CT FINDINGS: Lower chest: The majority of the chest was scanned as part of the examination from the level of the aortic arch down through the diaphragm. Chest port is seen. Heart is nonenlarged. Scattered vascular calcifications. 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 Slightly patulous esophagus. Lung nodules are identified. Example left upper lobe measuring 15 by 11 mm on series 6, image 28. There are additional larger areas along the right lower lobe but partially obscured by the adjacent lung opacity and small right pleural effusion. Dedicated chest CT may be useful to further delineate these findings in correlation to the patient's history of metastatic disease from colon cancer. Hepatobiliary: There has been placement of a right-sided indwelling PTC catheter with has distal pigtail extending into the second portion of the duodenal distally. There is a small subcapsular fluid collection just superior to the entrance site of the  catheter on series 2, image 28 which is new from previous measuring 16 by 6 mm. There are areas of biliary ductal dilatation identified particularly left hepatic lobe. These are similar to slightly improved from the prior CT scan. No new areas of intrahepatic biliary ductal dilatation. Gallbladder once again is severely dilated with stones. There is also some wall thickening and edema today which is progressive from the prior examination. Central small liver mass identified in segment 4 on series 2, image 26 measuring 14 by 13 mm. Pancreas: Unremarkable. No pancreatic ductal dilatation or surrounding inflammatory changes. Spleen: Spleen is enlarged with a length of 13.9 cm. Previously 14.4 cm, similar. Adrenals/Urinary Tract: The adrenal glands are preserved. Kidneys are unremarkable. Stomach/Bowel: Visualized bowel is nondilated. Slight wall thickening along the duodenal, nonspecific. Surgical changes along the loops of bowel in the right lower quadrant. Vascular/Lymphatic: Normal caliber aorta and IVC with some scattered vascular calcifications. There is a prominent lymph node along the portacaval region measuring 16 by 10 mm, mildly enlarged. In retrospect unchanged from prior. Few other adjacent small prominent nodes identified as well. Other: Mild free fluid.  No free intra-air. Musculoskeletal: Degenerative changes seen along the spine. IMPRESSION: Indwelling right-sided PTC catheter. The level of the intrahepatic biliary duct dilatation is similar to slightly decreased. New small subcapsular fluid collection along the liver just superior to the entrance site of the PTC catheter. Dilated gallbladder with stones. There is progressive wall thickening and wall edema. Please correlate with any clinical findings of acute cholecystitis. If needed dedicated workup such as HIDA scan could be considered Stable appearance of the intrinsic liver mass segment 4 as well as mildly enlarged porta hepatic lymph node.  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 Trace ascites. Lung masses identified. Please correlate for  history of metastatic disease to the chest. If needed additional chest CT dedicated workup can be performed when clinically appropriate. Persistent small right pleural effusion. Electronically Signed   By: Karen Kays M.D.   On: 05/01/2022 11:05   ECHOCARDIOGRAM COMPLETE  Result Date: 04/27/2022    ECHOCARDIOGRAM REPORT   Patient Name:   SHANAN FITZPATRICK Date of Exam: 04/27/2022 Medical Rec #:  [id number]        Height:       62.0 in Accession #:    [id number]       Weight:       215.8 lb Date of Birth:  1950-08-06       BSA:          1.975 m Patient Age:    71 years         BP:           151/65 mmHg Patient Gender: F                HR:           74 bpm. Exam Location:  Inpatient Procedure: 2D Echo, 3D Echo, Cardiac Doppler, Color Doppler and Strain Analysis Indications:    R00.8 Other abnormalities of heart beat  History:        Patient has no prior history of Echocardiogram examinations.                 Risk Factors:Hypertension. Metastatic cancer. Chemo.  Sonographer:    Sheralyn Boatman RDCS Referring Phys: [phone] A CALDWELL POWELL JR  Sonographer Comments: Technically difficult study due to poor echo windows, patient is obese, suboptimal subcostal window and suboptimal parasternal window. Image acquisition challenging due to patient body habitus. IMPRESSIONS  1. Left ventricular ejection fraction, by estimation, is 70 to 75%. The left ventricle has hyperdynamic function. The left ventricle has no regional wall motion abnormalities. Left ventricular diastolic parameters were normal.  2. Right ventricular systolic function is normal. The right ventricular size is normal. Tricuspid regurgitation signal is inadequate for assessing PA pressure.  3. The mitral valve is normal in structure. Trivial mitral valve regurgitation. No evidence of mitral stenosis.  4. The  aortic valve was not well visualized. Aortic valve regurgitation is not visualized. No aortic stenosis is present.  5. The inferior vena cava is normal in size with greater than 50% respiratory variability, suggesting right atrial pressure of 3 mmHg. FINDINGS  Left Ventricle: Left ventricular ejection fraction, by estimation, is 70 to 75%. The left ventricle has hyperdynamic function. The left ventricle has no regional wall motion abnormalities. 3D ejection fraction reviewed and evaluated as part of the interpretation. Alternate measurement of EF is felt to be most reflective of LV function. The left ventricular internal cavity size was small. There is no left ventricular hypertrophy. Left ventricular diastolic parameters were normal. Right Ventricle: The right ventricular size is normal. No increase in right ventricular wall thickness. Right ventricular systolic function is normal. Tricuspid regurgitation signal is inadequate for assessing PA pressure. Left Atrium: Left atrial size was normal in size. Right Atrium: Right atrial size was normal in size. Pericardium: There is no evidence of pericardial effusion. Mitral Valve: The mitral valve is normal in structure. Trivial mitral valve regurgitation. No evidence of mitral valve stenosis. Tricuspid Valve: The tricuspid valve is normal in structure. Tricuspid valve regurgitation is trivial. No evidence of tricuspid stenosis. Aortic Valve: The aortic valve was not well visualized. Aortic valve regurgitation is not visualized. No aortic  stenosis is present. Pulmonic Valve: The pulmonic valve was normal in structure. Pulmonic valve regurgitation is not visualized. No evidence of pulmonic stenosis. Aorta: Linear echodensity in the ascending aorta most likely represents reverberation artifact. The aortic root is normal in size and structure. Venous: The inferior vena cava is normal in size with greater than 50% respiratory variability, suggesting right atrial pressure of 3  mmHg. IAS/Shunts: The atrial septum is grossly normal.  LEFT VENTRICLE PLAX 2D LVIDd:         4.00 cm     Diastology LVIDs:         2.20 cm     LV e' medial:    7.51 cm/s LV PW:         1.00 cm     LV E/e' medial:  16.5 LV IVS:        0.90 cm     LV e' lateral:   7.83 cm/s LVOT diam:     2.10 cm     LV E/e' lateral: 15.8 LV SV:         94 LV SV Index:   47 LVOT Area:     3.46 cm  LV Volumes (MOD) LV vol d, MOD A2C: 58.3 ml LV vol d, MOD A4C: 56.3 ml LV vol s, MOD A2C: 12.4 ml LV vol s, MOD A4C: 10.4 ml LV SV MOD A2C:     45.9 ml LV SV MOD A4C:     56.3 ml LV SV MOD BP:      44.9 ml RIGHT VENTRICLE RV S prime:     13.10 cm/s TAPSE (M-mode): 2.0 cm LEFT ATRIUM             Index        RIGHT ATRIUM          Index LA diam:        3.40 cm 1.72 cm/m   RA Area:     8.52 cm LA Vol (A2C):   34.1 ml 17.27 ml/m  RA Volume:   14.00 ml 7.09 ml/m LA Vol (A4C):   31.2 ml 15.80 ml/m LA Biplane Vol: 33.5 ml 16.97 ml/m  AORTIC VALVE LVOT Vmax:   118.00 cm/s LVOT Vmean:  79.500 cm/s LVOT VTI:    0.270 m  AORTA Ao Root diam: 2.80 cm Ao Asc diam:  3.30 cm MITRAL VALVE MV Area (PHT): 3.42 cm     SHUNTS MV Decel Time: 222 msec     Systemic VTI:  0.27 m MV E velocity: 124.00 cm/s  Systemic Diam: 2.10 cm MV A velocity: 128.00 cm/s MV E/A ratio:  0.97 Weston BrassGayatri Acharya MD Electronically signed by Weston BrassGayatri Acharya MD Signature Date/Time: 04/27/2022/2:32:06 PM    Final    IR CONVERT BILIARY DRAIN TO INT EXT BILIARY DRAIN  Result Date: 04/26/2022 INDICATION: History of metastatic colon cancer, now with malignant obstruction at the level of the biliary hilum. Patient underwent placement of an external biliary drainage catheter on 04/21/2022 however initial access was very challenging secondary to a combination lack of significant dilatation of the right intrahepatic biliary system as well as patient body habitus. Ultimately, a duct within the posterior aspect of the right lobe of the liver was cannulated however would not allow for  placement of an internal/external drainage catheter secondary to angulation of the access site, confirmed on subsequent abdominal CT performed 04/25/2022. As such, patient presents today for attempted placement of a new right-sided biliary drainage catheter as well as potential placement  of a left-sided biliary drainage catheter. EXAM: 1. FLUOROSCOPIC GUIDED PERCUTANEOUS TRANSHEPATIC CHOLANGIOGRAM AND BILIARY TUBE PLACEMENT VIA RIGHT-SIDED BILIARY APPROACH 2. ATTEMPTED THOUGH ULTIMATELY UNSUCCESSFUL TRAVERSAL INTO THE ATROPHIC LEFT INTRAHEPATIC BILIARY SYSTEM. COMPARISON:  CT abdomen and pelvis-04/25/2022; 04/13/2022; fluoroscopic images during right-sided external biliary drainage catheter placement-04/21/2022 MEDICATIONS: Ceftriaxone 2 g; the antibiotic was administered with an appropriate time frame prior to the initiation of the procedure CONTRAST:  40mL OMNIPAQUE IOHEXOL 300 MG/ML SOLN - administered into the biliary tree. ANESTHESIA/SEDATION: General anesthesia FLUOROSCOPY TIME:  863 mGy COMPLICATIONS: None immediate. TECHNIQUE: Informed written consent was obtained from the patient after a discussion of the risks, benefits and alternatives to treatment. Questions regarding the procedure were encouraged and answered. A timeout was performed prior to the initiation of the procedure. Patient was placed under general anesthesia via the anesthesia department Next, with the patient positioned slightly left lateral decubitus, the right upper abdominal quadrant was prepped and draped in the usual sterile fashion, and a sterile drape was applied covering the operative field. Maximum barrier sterile technique with sterile gowns and gloves were used for the procedure. A timeout was performed prior to the initiation of the procedure. The pre-existing suboptimally positioned right-sided external biliary drainage catheter was injected with a small amount of contrast demonstrating appropriate positioning with end coiled and  locked within the central aspect of the right biliary tree with minimal opacification of the common bile duct. There is no definitive opacification of the left intrahepatic biliary system. Next, under direct fluoroscopic guidance, the access site of the suboptimally positioned biliary drainage catheter was targeted with a 22 gauge Chiba needle. Appropriate access to the biliary system was confirmed with a reflux of a small amount of bile as well as advancement of the Nitrex wire along the pre-existing drainage catheter to the level of the biliary hilum. The track was dilated with an Accustick set. A stiff glidewire and a 4 French angled glide catheter were advanced through the outer sheath of the Accustick set to the level of the horizontal segment of the duodenum. Contrast injection confirmed appropriate positioning. Next, the pre-existing biliary drainage catheter was removed intact. At this time, the 4 French angled glide catheter was exchanged for a 7 [place], 35 cm radiopaque tip vascular sheath which was advanced to the level of the biliary hilum. Contrast injection demonstrated faint opacification of the central aspect of the left intrahepatic biliary tree. Despite prolonged efforts, the obstructing lesion at the level of the biliary hilum could not be traversed. Sonographic evaluation was performed of the abdomen and demonstrated a markedly atrophic left lobe of the liver, poorly visualized sonographically and noted to be immediately adjacent to the cardiac apex. There is consideration for direct sticking of the central aspect of the left lobe of the liver however this ultimately was deferred secondary to proximity of the cardiac apex. At this time, the vascular sheath was exchanged for a 10 [place] biliary drainage catheter with end coiled and locked within the duodenum with radiopaque side marker located at the level of the biliary hilum. Contrast injection was performed and the procedure was terminated The  biliary drainage catheter was secured at the skin entrance site within interrupted suture, flushed with a small amount of saline and connected to a gravity bag. The patient was extubated and tolerated the procedure well without immediate postprocedural complication. FINDINGS: Contrast injection of pre-existing external biliary drainage catheter demonstrates appropriate positioning with end coiled and locked within the central aspect of the right biliary  tree. There is minimal opacification of the common bile duct without definitive opacification of the left intrahepatic biliary tree. Under fluoroscopic guidance, the access site of the pre-existing biliary drainage catheter was cannulated at a more favorable angle ultimately allowing placement of a internal/external 10 [place] biliary drainage catheter with end coiled within the horizontal segment of the duodenum and radiopaque side marker at the level of the biliary hilum As above, there is faint opacification of the central aspect of the atrophic left intrahepatic biliary tree however this could not be successfully traversed percutaneously from the right-sided approach There was consideration for accessing the atrophic left lobe with ultrasound and/or fluoroscopic guidance however this ultimately was deferred secondary to pre-existing marked atrophy of the left lobe as well proximity of the cardiac apex. IMPRESSION: 1. Successful percutaneous placement of a new internal/external 10 [place] biliary drainage catheter via a more favorable approach with end coiled within the duodenum and radiopaque side marker at the level of the central aspect of the right intrahepatic biliary tree. 2. Faint opacification of the central most aspect of the atrophic left intrahepatic biliary system. The left intrahepatic biliary system was unable to be traversed percutaneously from the right-sided approach secondary to malignant obstruction of the level of the hilum. Consideration for  either sonographic or fluoroscopic puncture of the atrophic left intrahepatic biliary tree was deferred secondary to marked atrophy of the left lobe of the liver and proximity of the cardiac apex. PLAN: Recommend maintaining the new internal/external biliary drainage catheter to external drainage realizing the output will be variable as some bile may be drain internally. Once the patient's bilirubin reaches a steady nadir, consideration for initiating a capping trial may be performed as indicated. Note, given central location of the biliary obstruction, the patient is likely NOT a candidate for internal biliary stent placement. Electronically Signed   By: Simonne Come M.D.   On: 04/26/2022 18:03   DG CHEST PORT 1 VIEW  Result Date: 04/25/2022 CLINICAL DATA:  Chest pain and shortness of breath EXAM: PORTABLE CHEST 1 VIEW COMPARISON:  12/31/2019, CT from earlier in the same day. FINDINGS: Cardiac shadow is stable. Right chest wall port is again noted. Left lung is clear with the exception of nodules in the upper lobe similar to that seen on previous exams. Right-sided pleural effusion with underlying consolidation is noted similar to that seen on CT from earlier in the same day. No other focal abnormality is noted. IMPRESSION: Persistent right-sided effusion and underlying consolidation. Stable left upper lobe pulmonary nodules. Electronically Signed   By: Alcide Clever M.D.   On: 04/25/2022 19:18   CT ABDOMEN W CONTRAST  Result Date: 04/25/2022 CLINICAL DATA:  Metastatic colon cancer, biliary obstruction suspected, status post stent placement, pain around drainage site * Tracking Code: BO * EXAM: CT ABDOMEN WITH CONTRAST TECHNIQUE: Multidetector CT imaging of the abdomen was performed using the standard protocol following bolus administration of intravenous contrast. RADIATION DOSE REDUCTION: This exam was performed according to the departmental dose-optimization program which includes automated exposure  control, adjustment of the mA and/or kV according to patient size and/or use of iterative reconstruction technique. CONTRAST:  OMNIPAQUE IOHEXOL 300 MG/ML  SOLN COMPARISON:  CT abdomen pelvis, 04/13/2022 FINDINGS: Lower chest: Moderate right pleural effusion associated atelectasis or consolidation, increased compared to prior examination. Underlying mass of the dependent right lung base, similar in appearance. Hepatobiliary: Interval placement of a right-sided percutaneous biliary drain, with a formed pigtail in the central right hepatic ducts (  series 2, image 27). No fluid collection or other abnormality associated with the drain tract. Diminished right-sided biliary ductal dilatation on today's examination. Similar appearance of left-sided biliary ductal dilatation (series 2, image 23). No directly visualized obstipated metastasis however there is ill-defined hypodensity in the central liver (series 2, image 23). Numerous small gallstones and excreted contrast in the gallbladder. Pancreas: New inflammatory fat stranding about the pancreatic head and uncinate (series 2, image 41). No acute pancreatic fluid collection. Spleen: Unchanged splenomegaly, maximum span 14.4 cm. Adrenals/Urinary Tract: Adrenal glands are unremarkable. Kidneys are normal, without renal calculi, solid lesion, or hydronephrosis. Stomach/Bowel: Stomach is within normal limits. No evidence of bowel wall thickening, distention, or inflammatory changes. Vascular/Lymphatic: Aortic atherosclerosis. Splenic and gastroesophageal varices (series 2, image 21). No enlarged abdominal lymph nodes. Other: No abdominal wall hernia or abnormality. New small volume perihepatic and perisplenic ascites. Musculoskeletal: No acute or significant osseous findings. IMPRESSION: 1. Interval placement of a right-sided percutaneous biliary drain, with a formed pigtail in the central right hepatic ducts. No fluid collection or other abnormality associated with the  drain tract. 2. Diminished right-sided biliary ductal dilatation on today's examination. Similar appearance of left-sided biliary ductal dilatation. No directly visualized obstipating metastasis however there is ill-defined hypodensity in the central liver keeping with a known metastasis. 3. New inflammatory fat stranding about the pancreatic head and uncinate, consistent with acute pancreatitis. No acute pancreatic fluid collection. 4. New small volume perihepatic and perisplenic ascites. 5. Moderate right pleural effusion associated atelectasis or consolidation, increased compared to prior examination. Underlying mass of the dependent right lung base, similar in appearance. 6. Cholelithiasis. 7. Unchanged splenomegaly. 8. Splenic and gastroesophageal varices. Aortic Atherosclerosis (ICD10-I70.0). Electronically Signed   By: Jearld Lesch M.D.   On: 04/25/2022 13:37   IR PERCUTANEOUS TRANSHEPATIC CHOLANGIOGRAM  Result Date: 04/22/2022 INDICATION: 72 year old woman with history metastatic rectal cancer causing biliary obstruction presents to IR for percutaneous cholangiogram and drain placement. Patient's bilirubin did not significantly improve status post ERCP and stent placement due to high position of obstructing hepatic metastasis. EXAM: Percutaneous transhepatic cholangiogram and biliary drain placement MEDICATIONS: Patient currently on inpatient regiment of antibiotics. ANESTHESIA/SEDATION: Moderate (conscious) sedation was employed during this procedure. A total of Versed 4 mg and Fentanyl 200 mcg was administered intravenously by the radiology nurse. Total intra-service moderate Sedation Time: 63 minutes. The patient's level of consciousness and vital signs were monitored continuously by radiology nursing throughout the procedure under my direct supervision. FLUOROSCOPY: Radiation Exposure Index (as provided by the fluoroscopic device): 201 mGy Kerma COMPLICATIONS: None immediate. PROCEDURE: Informed  written consent was obtained from the patient after a thorough discussion of the procedural risks, benefits and alternatives. All questions were addressed. Maximal Sterile Barrier Technique was utilized including caps, mask, sterile gowns, sterile gloves, sterile drape, hand hygiene and skin antiseptic. A timeout was performed prior to the initiation of the procedure. Patient positioned supine on the procedure table. The right upper quadrant and epigastric skin prepped and draped in usual fashion. Sonographic evaluation of the liver demonstrated minimal biliary dilatation in the right hepatic lobe. The atrophic left hepatic lobe could not be visualized with ultrasound. Following local lidocaine administration, multiple attempts were made to access bile ducts in the right hepatic lobe with a 21 gauge needle, however most are unsuccessful. Access was particularly difficult due to overlying ribs and high position of liver. 21 gauge needle was advanced into the right hepatic lobe using ultrasound guidance and slowly retracted under fluoroscopy until bile  return was noted. Contrast administered through the 21 gauge needle opacified the right hepatic ducts and showed patent common bile duct. Given the central location of the accessed duct, decision was made to access a more peripheral right hepatic lobe duct utilizing a 21 gauge needle. 21 gauge needle exchanged for transitional dilator set over 0.018 inch guidewire. Kumpe catheter and glidewire were advanced successfully across the right lobe bile duct stenosis, into the CBD, and eventually into the doudenum. Tract dilation was performed, however the internal external biliary drain could not be advanced into the liver due to the angle of access. Continuous bleeding was seen along the access tract when the catheter or drain were not in place. This is likely due to hemorrhage from an intercostal vessel. No pulsatile flow was identified. With drain across the access tract,  there was no hemorrhage. 5 mL of lidocaine with epi was administered along the tract and pursestring suture was applied which showed no hemorrhage. Since internal external biliary drain could not be advanced, decision was made to leave on external biliary drain in the right hepatic lobe. 8.5 [place] Mask drain was formed in the right hepatic bile ducts. Drain was secured to skin with suture and connected to bag. IMPRESSION: 1. Successful insertion of 8.5 [place] Mueller external biliary drain in right hepatic bile duct. 2. Placement was difficult due to position of patient's liver and minimal bile duct dilatation. 3. Slow continuous bleeding was present along the access tract when drain or catheter were not in place, likely originating from intercostal vessel. This was treated with lidocaine with epi and pursestring suture. With drain in place, there was no hemorrhage along the tract. PLAN: I suspect that the patient's hyperbilirubinemia is not due to biliary obstruction given that the common bile duct was patent to the level of the doudenum on percutaneous transhepatic cholangiogram. If the patient's bilirubinemia significantly improves with external drainage, internalization can be considered. Electronically Signed   By: Acquanetta Belling M.D.   On: 04/22/2022 07:43   DG C-Arm 1-60 Min-No Report  Result Date: 04/21/2022 Fluoroscopy was utilized by the requesting physician.  No radiographic interpretation.   MR ABDOMEN MRCP W WO CONTAST  Result Date: 04/20/2022 CLINICAL DATA:  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 Heavily T2-weighted images of the biliary and pancreatic ducts were obtained, and three-dimensional MRCP images were  rendered by post processing. CONTRAST:  9mL GADAVIST GADOBUTROL 1 MMOL/ML IV SOLN COMPARISON:  ERCP from 04/18/2022 and MRI abdomen through 04/14/2022 FINDINGS: Despite efforts by the technologist and patient, motion artifact is present on today's exam and could not be eliminated. This reduces exam sensitivity and specificity. Lower chest: Stable bilateral pulmonary nodules and right basilar atelectasis along with small right pleural effusion. Mild cardiomegaly. Uphill paraesophageal varices. Hepatobiliary: Dependent gallstones in the gallbladder. The biliary stent is difficult to visualize and is probably best seen on the axial postcontrast images extending from the duodenum up to the biliary tree to the confluence of the right and left hepatic ducts. The stent is not well visualized extending into either the right or left bile duct, and seems to terminate in the vicinity of a central mass which is near the confluence of the right and left hepatic ducts. This mass has indistinct borders complicated by  differences in the degree of enhancement of the left hepatic lobe in a right hepatic lobe, with delayed enhancement of the left hepatic lobe compared to the right, but is thought to measure at least about 4.0 by 1.7 cm on image 30 of series 17. There is mild right (particularly right posterior) and left intrahepatic biliary dilatation indicating that the stent may possibly not be fully traversing the mass, or may only be partially traversing the mass. The degree of left hepatic lobe biliary dilatation is mildly improved from 04/14/2022 but not resolved. No well-defined left portal vein enhancement, suspicious for left portal vein thrombosis. Pancreas: Mild pancreatic and peripancreatic edema, correlate with lipase levels to assess for pancreatitis. Spleen: The spleen measures 12.5 by 5.3 by 9.9 cm (volume = 340 cm^3). No significant focal splenic lesion. Adrenals/Urinary Tract:  Unremarkable Stomach/Bowel:  Unremarkable Vascular/Lymphatic: Varices along the gastric fundus. Suspicion for left portal vein thrombosis as noted in the liver section above. The right portal vein, main portal vein, splenic vein, and SMV appear patent. Esophageal and gastric varices indicative of portal venous hypertension. Abdominal aortic atherosclerosis. Other:  Trace fluid in the left paracolic gutter. Musculoskeletal: Suspected superior endplate compression fracture at T8, not readily visible on 01/25/2022, likely subacute. IMPRESSION: 1. The biliary stent appears to extend from the duodenum to the vicinity of a central mass near the confluence of the right and left hepatic ducts. There is mild right and left intrahepatic biliary dilatation indicating that the stent may possibly not be fully traversing the mass, or may only be partially traversing the mass. The degree of left hepatic lobe biliary dilatation is mildly improved from 04/14/2022 but not resolved. 2. No well-defined enhancement in the left portal vein, suspicious for left portal vein thrombosis. 3. Mild pancreatic and peripancreatic edema, correlate with lipase levels to assess for pancreatitis. 4. Stable bilateral pulmonary nodules and right basilar atelectasis along with small right pleural effusion. Mild cardiomegaly. 5. Superior endplate compression fracture at T8, not readily visible on 01/25/2022, likely subacute. 6. Uphill paraesophageal and gastric varices indicating portal venous hypertension. 7. Trace fluid in the left paracolic gutter. 8. Cholelithiasis. Electronically Signed   By: Gaylyn Rong M.D.   On: 04/20/2022 09:59   MR 3D Recon At Scanner  Result Date: 04/20/2022 CLINICAL DATA:  Rectal cancer metastatic to the lungs and liver, ongoing chemotherapy, assessment of biliary obstruction EXAM: MRI ABDOMEN WITHOUT AND WITH CONTRAST (INCLUDING MRCP) TECHNIQUE: Multiplanar multisequence MR imaging of the abdomen was performed both before and after the  administration of intravenous contrast. Heavily T2-weighted images of the biliary and pancreatic ducts were obtained, and three-dimensional MRCP images were rendered by post processing. CONTRAST:  9mL GADAVIST GADOBUTROL 1 MMOL/ML IV SOLN COMPARISON:  ERCP from 04/18/2022 and MRI abdomen through 04/14/2022 FINDINGS: Despite efforts by the technologist and patient, motion artifact is present on today's exam and could not be eliminated. This reduces exam sensitivity and specificity. Lower chest: Stable bilateral pulmonary nodules and right basilar atelectasis along with small right pleural effusion. Mild cardiomegaly. Uphill paraesophageal varices. Hepatobiliary: Dependent gallstones in the gallbladder. The biliary stent is difficult to visualize and is probably best seen on the axial postcontrast images extending from the duodenum up to the biliary tree to the confluence of the right and left hepatic ducts. The stent is not well visualized extending into either the right or left bile duct, and seems to terminate in the vicinity of a central mass which is near the confluence of the right  and left hepatic ducts. This mass has indistinct borders complicated by differences in the degree of enhancement of the left hepatic lobe in a right hepatic lobe, with delayed enhancement of the left hepatic lobe compared to the right, but is thought to measure at least about 4.0 by 1.7 cm on image 30 of series 17. There is mild right (particularly right posterior) and left intrahepatic biliary dilatation indicating that the stent may possibly not be fully traversing the mass, or may only be partially traversing the mass. The degree of left hepatic lobe biliary dilatation is mildly improved from 04/14/2022 but not resolved. No well-defined left portal vein enhancement, suspicious for left portal vein thrombosis. Pancreas: Mild pancreatic and peripancreatic edema, correlate with lipase levels to assess for pancreatitis. Spleen: The  spleen measures 12.5 by 5.3 by 9.9 cm (volume = 340 cm^3). No significant focal splenic lesion. Adrenals/Urinary Tract:  Unremarkable Stomach/Bowel: Unremarkable Vascular/Lymphatic: Varices along the gastric fundus. Suspicion for left portal vein thrombosis as noted in the liver section above. The right portal vein, main portal vein, splenic vein, and SMV appear patent. Esophageal and gastric varices indicative of portal venous hypertension. Abdominal aortic atherosclerosis. Other:  Trace fluid in the left paracolic gutter. Musculoskeletal: Suspected superior endplate compression fracture at T8, not readily visible on 01/25/2022, likely subacute. IMPRESSION: 1. The biliary stent appears to extend from the duodenum to the vicinity of a central mass near the confluence of the right and left hepatic ducts. There is mild right and left intrahepatic biliary dilatation indicating that the stent may possibly not be fully traversing the mass, or may only be partially traversing the mass. The degree of left hepatic lobe biliary dilatation is mildly improved from 04/14/2022 but not resolved. 2. No well-defined enhancement in the left portal vein, suspicious for left portal vein thrombosis. 3. Mild pancreatic and peripancreatic edema, correlate with lipase levels to assess for pancreatitis. 4. Stable bilateral pulmonary nodules and right basilar atelectasis along with small right pleural effusion. Mild cardiomegaly. 5. Superior endplate compression fracture at T8, not readily visible on 01/25/2022, likely subacute. 6. Uphill paraesophageal and gastric varices indicating portal venous hypertension. 7. Trace fluid in the left paracolic gutter. 8. Cholelithiasis. Electronically Signed   By: Gaylyn Rong M.D.   On: 04/20/2022 09:59   DG ERCP  Result Date: 04/18/2022 CLINICAL DATA:  Liver mass. EXAM: ERCP TECHNIQUE: Multiple spot images obtained with the fluoroscopic device and submitted for interpretation post-procedure.  FLUOROSCOPY TIME: FLUOROSCOPY TIME 30 minutes, 40 seconds (434.4 mGy) COMPARISON:  CT abdomen pelvis-04/13/2022; abdominal MRI-04/14/2022 FINDINGS: Ten spot intraoperative fluoroscopic images of the right upper abdominal quadrant during ERCP are provided for review Initial image demonstrates an ERCP probe overlying the right upper abdominal quadrant. Subsequent images demonstrate selective cannulation and opacification of the common bile duct which appears nondilated. There is tapered narrowing/subtotal occlusion of the common hepatic duct at the level of the biliary hilum with subsequent opacification of the left intrahepatic biliary system and ultimately with internal biliary stent placement. There is minimal opacification of the cystic duct with partial opacification of the gallbladder. IMPRESSION: ERCP with suspected malignant subtotal occlusion of the intrahepatic biliary tree at the level of the biliary hilum with subsequent biliary stent placement. These images were submitted for radiologic interpretation only. Please see the procedural report for the amount of contrast and the fluoroscopy time utilized. Electronically Signed   By: Simonne Come M.D.   On: 04/18/2022 15:12   DG C-Arm 1-60 Min-No Report  Result Date: 04/18/2022 Fluoroscopy was utilized by the requesting physician.  No radiographic interpretation.   MR LIVER W WO CONTRAST  Result Date: 04/14/2022 CLINICAL DATA:  Metastatic rectal cancer with history of pulmonary and hepatic metastatic disease, with ongoing chemotherapy. New LFT abnormalities with elevated total bilirubin. EXAM: MRI ABDOMEN WITHOUT AND WITH CONTRAST TECHNIQUE: Multiplanar multisequence MR imaging of the abdomen was performed both before and after the administration of intravenous contrast. CONTRAST:  9.36mL GADAVIST GADOBUTROL 1 MMOL/ML IV SOLN COMPARISON:  04/13/2022 CT abdomen/pelvis. FINDINGS: Lower chest: Redemonstration of multiple left upper lobe solid pulmonary  nodules up to 1.4 cm, not substantially changed. Redemonstration of sharply marginated geographic consolidation at the right lung base, unchanged and compatible with postradiation change. Stable small posterior right pleural effusion. Hepatobiliary: There is a hypoenhancing 2.7 x 1.9 cm central liver mass in the region of the biliary hilum (series 19/image 28), new from 09/24/2020 CT abdomen, in the same location as the hypermetabolic liver mass seen on 09/09/2019 PET-CT, compatible with recurrent liver metastasis. There is associated high-grade biliary stricture at the biliary hilum affecting the central right and left intrahepatic bile ducts (series 11/image 30). Moderate intrahepatic biliary ductal dilatation throughout the left liver lobe is chronic and similar to 09/24/2020 CT abdomen study with progressive atrophy of the left liver. Moderate intrahepatic biliary ductal dilatation in the posterior right liver and caudate lobes is worsened from 09/24/2020 CT abdomen. No hepatic steatosis. No additional liver masses. Multiple gallstones layering in the mildly distended gallbladder, largest 2.0 cm. No gallbladder wall thickening or pericholecystic fluid. Common bile duct diameter 4 mm. No choledocholithiasis. Pancreas: No pancreatic mass or duct dilation.  No pancreas divisum. Spleen: Normal size. No mass. Adrenals/Urinary Tract: Normal adrenals. No hydronephrosis. Normal kidneys with no renal mass. Stomach/Bowel: Normal non-distended stomach. Visualized small and large bowel is normal caliber, with no bowel wall thickening. Vascular/Lymphatic: Atherosclerotic nonaneurysmal abdominal aorta. Patent portal, splenic, hepatic and renal veins. No pathologically enlarged lymph nodes in the abdomen. Other: No abdominal ascites or focal fluid collection. Musculoskeletal: No aggressive appearing focal osseous lesions. IMPRESSION: 1. Hypoenhancing 2.7 x 1.9 cm central liver mass in the region of the biliary hilum, new from  09/24/2020 CT abdomen study, compatible with solitary recurrent liver metastasis. Associated high-grade malignant biliary hilar stricture. Moderate intrahepatic biliary ductal dilatation in the posterior right liver and caudate lobes is worsened from 09/24/2020 CT abdomen study. Similar chronic moderate intrahepatic biliary ductal dilatation with associated lobar atrophy in the left liver lobe. 2. No additional sites of metastatic disease in the abdomen. 3. Stable left upper lobe pulmonary metastases. 4. Stable right lung base postradiation change and small posterior right pleural effusion. 5. Cholelithiasis. No evidence of choledocholithiasis. Common bile duct diameter 4 mm. These results will be called to the ordering clinician or representative by the Radiology Department at the imaging location. Electronically Signed   By: Delbert Phenix M.D.   On: 04/14/2022 10:06   CT Abdomen Pelvis W Contrast  Result Date: 04/13/2022 CLINICAL DATA:  Elevated liver function studies. History of metastatic colorectal cancer. EXAM: CT ABDOMEN AND PELVIS WITH CONTRAST TECHNIQUE: Multidetector CT imaging of the abdomen and pelvis was performed using the standard protocol following bolus administration of intravenous contrast. RADIATION DOSE REDUCTION: This exam was performed according to the departmental dose-optimization program which includes automated exposure control, adjustment of the mA and/or kV according to patient size and/or use of iterative reconstruction technique. CONTRAST:  80mL OMNIPAQUE IOHEXOL 350 MG/ML SOLN COMPARISON:  CT  09/24/2020 FINDINGS: Lower chest: Stable 14 mm are left lung nodule on image number 4/4. Stable nodular mass in the right lower lobe measuring a maximum of 3.2 cm. 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 No new pulmonary nodules or pleural effusions. The heart is normal in size. No pericardial effusion. Hepatobiliary: Persistent/chronic intrahepatic  biliary dilatation most notable in the left hepatic lobe. Vague area of low attenuation near the porta hepatis measuring 15 mm on image 19/2. I do not see this for certain on the prior CT scan. Could not exclude a metastatic focus. MRI abdomen without and with contrast may be helpful for further evaluation. Numerous small rim calcified gallstones are noted the gallbladder but no findings for acute cholecystitis. No common bile duct dilatation. Pancreas: No mass, inflammation or ductal dilatation. Spleen: Stable mild splenomegaly. 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 The portal vein is patent. Adrenals/Urinary Tract: The adrenal glands are normal. Both kidneys are normal. The bladder is unremarkable but is not well distended. Stomach/Bowel: The stomach, duodenum, small bowel and colon grossly normal. There are surgical changes from an APR with low anastomosis. Surgical changes also involving a right upper quadrant small bowel loop. Vascular/Lymphatic: The aorta is normal in caliber. Moderate atherosclerotic calcifications. The branch vessels are patent. The major venous structures are patent. No mesenteric or retroperitoneal or pelvic lymphadenopathy. Reproductive: The uterus and ovaries are unremarkable. Other: Stable postsurgical changes in the presacral space. No findings suspicious for recurrent tumor. Musculoskeletal: No significant bony findings. IMPRESSION: 1. Persistent/chronic intrahepatic biliary dilatation most notable in the left hepatic lobe. 2. Vague area of low attenuation near the porta hepatis. Could not exclude a metastatic focus. MRI abdomen without and with contrast may be helpful for further evaluation. 3. Cholelithiasis. 4. Stable postsurgical changes from an APR with low anastomosis. No findings suspicious for recurrent tumor. 5. Stable postsurgical changes in the presacral space. Electronically  Signed   By: Rudie Meyer M.D.   On: 04/13/2022 13:11    Labs:  CBC: Recent Labs    05/03/22 0701 05/03/22 1550 05/04/22 0601 05/10/22 1410  WBC 3.7* 3.9* 4.2 3.5*  HGB 7.2* 7.8* 7.4* 9.2*  HCT 22.9* 24.4* 23.4* 27.9*  PLT 92* 113* 113* 104*    COAGS: Recent Labs    04/17/22 0841 04/24/22 0500 04/30/22 0500  INR 1.1 1.2 1.4*    BMP: Recent Labs    05/02/22 0500 05/03/22 0701 05/04/22 0601 05/10/22 1410  NA 132* 136 135 140  K 3.4* 3.5 3.3* 3.1*  CL 106 111 112* 109  CO2 20* 20* 19* 23  GLUCOSE 102* 88 94 115*  BUN 17 14 12 9   CALCIUM 7.7* 7.6* 7.4* 8.1*  CREATININE 0.54 0.58 0.52 0.55  GFRNONAA >60 >60 >60 >60    LIVER FUNCTION TESTS: Recent Labs    05/02/22 0500 05/03/22 0701 05/04/22 0601 05/10/22 1410  BILITOT 16.3* 13.4* 12.0* 11.8*  AST 48* 56* 68* 82*  ALT 43 40 45* 48*  ALKPHOS 134* 112 115 197*  PROT 5.2* 4.9* 5.0* 5.4*  ALBUMIN 1.8* 1.7* 1.6* 2.9*     Assessment and Plan: Obstructive jaundice Plan for cholangiogram with exchange/upsize of biliary drain. Labs pending. Risks and benefits of cholangiogram/drain exchange discussed with the patient including, but not limited to bleeding, infection which may lead to sepsis or even death and damage to adjacent structures.  This interventional procedure involves the use of X-rays and because  of the nature of the planned procedure, it is possible that we will have prolonged use of X-ray fluoroscopy.  Potential radiation risks to you include (but are not limited to) the following: - A slightly elevated risk for cancer  several years later in life. This risk is typically less than 0.5% percent. This risk is low in comparison to the normal incidence of human cancer, which is 33% for women and 50% for men according to the [employer]. - Radiation induced injury can include skin redness, resembling a rash, tissue breakdown / ulcers and hair loss (which can be temporary or permanent).    The likelihood of either of these occurring depends on the difficulty of the procedure and whether you are sensitive to radiation due to previous procedures, disease, or genetic conditions.   IF your procedure requires a prolonged use of radiation, you will be notified and given written instructions for further action.  It is your responsibility to monitor the irradiated area for the 2 weeks following the procedure and to notify your physician if you are concerned that you have suffered a radiation induced injury.    All of the patient's questions were answered, patient is agreeable to proceed.  Consent signed and in chart.     Electronically Signed: Brayton El, PA-C 05/11/2022, 11:39 AM   I spent a total of 20 minutes in face to face in clinical consultation, greater than 50% of which was counseling/coordinating care for bili drain exchange

## 2022-05-11 NOTE — Procedures (Signed)
Pre procedural Dx: Malignant biliary obstruction Post procedural Dx: Same  Successful fluoro guided exchange and upsizing of now 12 Fr right sided internal/external biliary drain. Successful fluoro guided placement of a right to left external 8 Fr biliary drain with end terminating within the central aspect of the left intrahepatic biliary tree. Both drain connected to gravity bags.  EBL: Trace Complications: None immediate  Katherina Right, MD Pager #: [phone]

## 2022-05-12 ENCOUNTER — Other Ambulatory Visit (HOSPITAL_COMMUNITY): Payer: Self-pay | Admitting: Radiology

## 2022-05-12 ENCOUNTER — Other Ambulatory Visit: Payer: Self-pay

## 2022-05-12 ENCOUNTER — Other Ambulatory Visit: Payer: Self-pay | Admitting: Nurse Practitioner

## 2022-05-12 DIAGNOSIS — K831 Obstruction of bile duct: Secondary | ICD-10-CM

## 2022-05-12 DIAGNOSIS — C20 Malignant neoplasm of rectum: Secondary | ICD-10-CM

## 2022-05-15 ENCOUNTER — Encounter (HOSPITAL_COMMUNITY): Payer: Self-pay | Admitting: Radiology

## 2022-05-17 ENCOUNTER — Other Ambulatory Visit (HOSPITAL_COMMUNITY): Payer: Self-pay

## 2022-05-18 ENCOUNTER — Inpatient Hospital Stay: Payer: Medicare Other

## 2022-05-18 ENCOUNTER — Telehealth: Payer: Self-pay | Admitting: *Deleted

## 2022-05-18 DIAGNOSIS — C20 Malignant neoplasm of rectum: Secondary | ICD-10-CM | POA: Diagnosis not present

## 2022-05-18 LAB — CBC WITH DIFFERENTIAL (CANCER CENTER ONLY)
Abs Immature Granulocytes: 0.03 10*3/uL (ref 0.00–0.07)
Basophils Absolute: 0 10*3/uL (ref 0.0–0.1)
Basophils Relative: 1 %
Eosinophils Absolute: 0.1 10*3/uL (ref 0.0–0.5)
Eosinophils Relative: 1 %
HCT: 32.1 % — ABNORMAL LOW (ref 36.0–46.0)
Hemoglobin: 10.6 g/dL — ABNORMAL LOW (ref 12.0–15.0)
Immature Granulocytes: 1 %
Lymphocytes Relative: 14 %
Lymphs Abs: 0.6 10*3/uL — ABNORMAL LOW (ref 0.7–4.0)
MCH: 35.6 pg — ABNORMAL HIGH (ref 26.0–34.0)
MCHC: 33 g/dL (ref 30.0–36.0)
MCV: 107.7 fL — ABNORMAL HIGH (ref 80.0–100.0)
Monocytes Absolute: 0.6 10*3/uL (ref 0.1–1.0)
Monocytes Relative: 13 %
Neutro Abs: 3.2 10*3/uL (ref 1.7–7.7)
Neutrophils Relative %: 70 %
Platelet Count: 121 10*3/uL — ABNORMAL LOW (ref 150–400)
RBC: 2.98 MIL/uL — ABNORMAL LOW (ref 3.87–5.11)
RDW: 16.5 % — ABNORMAL HIGH (ref 11.5–15.5)
WBC Count: 4.5 10*3/uL (ref 4.0–10.5)
nRBC: 0 % (ref 0.0–0.2)

## 2022-05-18 LAB — CMP (CANCER CENTER ONLY)
ALT: 35 U/L (ref 0–44)
AST: 70 U/L — ABNORMAL HIGH (ref 15–41)
Albumin: 2.8 g/dL — ABNORMAL LOW (ref 3.5–5.0)
Alkaline Phosphatase: 136 U/L — ABNORMAL HIGH (ref 38–126)
Anion gap: 10 (ref 5–15)
BUN: 14 mg/dL (ref 8–23)
CO2: 26 mmol/L (ref 22–32)
Calcium: 8.2 mg/dL — ABNORMAL LOW (ref 8.9–10.3)
Chloride: 104 mmol/L (ref 98–111)
Creatinine: 0.72 mg/dL (ref 0.44–1.00)
GFR, Estimated: >60 mL/min (ref >60)
Glucose, Bld: 82 mg/dL (ref 70–99)
Potassium: 3.3 mmol/L — ABNORMAL LOW (ref 3.5–5.1)
Sodium: 140 mmol/L (ref 135–145)
Total Bilirubin: 11.5 mg/dL (ref 0.3–1.2)
Total Protein: 5.7 g/dL — ABNORMAL LOW (ref 6.5–8.1)

## 2022-05-18 NOTE — Telephone Encounter (Signed)
CRITICAL VALUE STICKER  CRITICAL VALUE: total bili = 11.5  RECEIVER (on-site recipient of call):Dilara Navarrete,RN  DATE & TIME NOTIFIED: 05/18/22 @ 1310  MESSENGER (representative from lab):Maurine Minister  MD NOTIFIED: Dr. Truett Perna  TIME OF NOTIFICATION: 1315  RESPONSE: F/U as scheduled and results forwarded to Dr. Grace Isaac in radiology. Left VM for patient that nurse will leave message on her MyChart re: labs.

## 2022-05-19 ENCOUNTER — Other Ambulatory Visit (HOSPITAL_COMMUNITY): Payer: Self-pay

## 2022-05-22 ENCOUNTER — Other Ambulatory Visit: Payer: Self-pay

## 2022-05-22 ENCOUNTER — Other Ambulatory Visit (HOSPITAL_COMMUNITY): Payer: Self-pay | Admitting: Interventional Radiology

## 2022-05-22 DIAGNOSIS — C189 Malignant neoplasm of colon, unspecified: Secondary | ICD-10-CM

## 2022-05-22 DIAGNOSIS — C20 Malignant neoplasm of rectum: Secondary | ICD-10-CM

## 2022-05-23 ENCOUNTER — Other Ambulatory Visit (HOSPITAL_COMMUNITY): Payer: Self-pay | Admitting: Interventional Radiology

## 2022-05-23 ENCOUNTER — Other Ambulatory Visit: Payer: Self-pay | Admitting: Oncology

## 2022-05-23 ENCOUNTER — Other Ambulatory Visit (HOSPITAL_COMMUNITY): Payer: Self-pay

## 2022-05-23 DIAGNOSIS — E039 Hypothyroidism, unspecified: Secondary | ICD-10-CM

## 2022-05-23 DIAGNOSIS — K831 Obstruction of bile duct: Secondary | ICD-10-CM

## 2022-05-23 DIAGNOSIS — C20 Malignant neoplasm of rectum: Secondary | ICD-10-CM

## 2022-05-24 ENCOUNTER — Other Ambulatory Visit: Payer: Self-pay

## 2022-05-24 ENCOUNTER — Other Ambulatory Visit: Payer: Self-pay | Admitting: Student

## 2022-05-24 ENCOUNTER — Other Ambulatory Visit: Payer: Self-pay | Admitting: Radiology

## 2022-05-24 DIAGNOSIS — K831 Obstruction of bile duct: Secondary | ICD-10-CM

## 2022-05-24 DIAGNOSIS — C20 Malignant neoplasm of rectum: Secondary | ICD-10-CM

## 2022-05-24 NOTE — H&P (Incomplete)
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  She is scheduled today for follow-up CT abdomen pelvis, biliary drain exchange and follow-up liver function tests. She denies fever, HA, CP, dyspnea, cough, abd pain, N/V or bleeding. She does have back pain, jaundice. She follows up with Dr. Truett Perna next week.      Past Medical History:  Diagnosis Date   Anemia    remote hx.   Arthritis    Bi lat knee   Cancer 07/2017   Rectal Cancer    Fatty liver    ultrasound December 2012   Hypothyroid 01/16/2013 dx   Irregular heartbeat    PER PATIENT REPORT; ONSET 20+YEARS AGO SAW CARDIOLOGY AT THE TIME C/O "ITS BEAT REALLY FAST FOR A MINUTE AND THEN STOPPED " DENIES ANY OTHER CARDIAC SX';  REPORTS CARDIO DID ECHO WHICH WAS NEGATIVE;  NOW COMES AND GOES ;    Lichen sclerosus    steroids as needed   Temporary low platelet count    SEE LAST LABS IN EPIC   Past Surgical History:  Procedure Laterality Date   APPENDECTOMY  1967   BILIARY STENT PLACEMENT N/A 04/18/2022   Procedure: BILIARY STENT PLACEMENT;  Surgeon: Iva Boop, MD;  Location: [facility];  Service: Gastroenterology;  Laterality: N/A;   CESAREAN SECTION  1976 & 1983   COLONOSCOPY  08/09/2017   danis - polyps   ERCP N/A 04/18/2022   Procedure: ENDOSCOPIC RETROGRADE CHOLANGIOPANCREATOGRAPHY (ERCP);  Surgeon:  Iva Boop, MD;  Location: [facility];  Service: Gastroenterology;  Laterality: N/A;   ESOPHAGOGASTRODUODENOSCOPY (EGD) WITH PROPOFOL N/A 04/21/2022   Procedure: ESOPHAGOGASTRODUODENOSCOPY (EGD) WITH PROPOFOL;  Surgeon: Iva Boop, MD;  Location: [facility];  Service: Gastroenterology;  Laterality: N/A;   ILEOSTOMY CLOSURE N/A 07/10/2018   Procedure: ILEOSTOMY LOOP TAKEDOWN WITH TAP BLOCK;  Surgeon: Karie Soda, MD;  Location: [facility];  Service: General;  Laterality: N/A;   IR BILIARY DRAIN PLACEMENT WITH CHOLANGIOGRAM  05/11/2022   IR CONVERT BILIARY DRAIN TO INT EXT BILIARY DRAIN  04/26/2022   IR EXCHANGE BILIARY DRAIN  05/11/2022   IR IMAGING GUIDED PORT INSERTION  09/29/2019   IR PERCUTANEOUS TRANSHEPATIC CHOLANGIOGRAM  04/21/2022   OSTOMY N/A 12/21/2017   Procedure: DIVERTING LOOP OSTOMY;  Surgeon: Karie Soda, MD;  Location: [facility];  Service: General;  Laterality: N/A;   PROCTOSCOPY N/A 12/21/2017   Procedure: RIGID PROCTOSCOPY;  Surgeon: Karie Soda, MD;  Location: [facility];  Service: General;  Laterality: N/A;   RADIOLOGY WITH ANESTHESIA N/A 04/26/2022   Procedure: PERCUTANEOUS TRANSHEPATIC  CHOLANGIOGRAM;  Surgeon: Simonne Come, MD;  Location: [facility];  Service: Anesthesiology;  Laterality: N/A;   STENT REMOVAL  04/21/2022   Procedure: STENT REMOVAL;  Surgeon: Iva Boop, MD;  Location: [facility];  Service: Gastroenterology;;   XI ROBOTIC ASSISTED LOWER ANTERIOR RESECTION N/A 12/21/2017   Procedure: XI ROBOTIC ASSISTED LOWER ANTERIOR RESECTION;  Surgeon: Karie Soda, MD;  Location:  [facility];  Service: General;  Laterality: N/A;      Allergies: Epinephrine, Losartan, Latex, and Vectibix [panitumumab]  Medications: Prior to Admission medications   Medication Sig Start Date End Date Taking? Authorizing Provider  albuterol (PROVENTIL HFA) 108 (90 Base) MCG/ACT inhaler Inhale 2 puffs into the lungs every 6 (six) hours as needed for wheezing or shortness of breath.  08/03/20   Rana Snare, NP  bisacodyl (DULCOLAX) 5 MG EC tablet Take 5 mg by mouth daily as needed for mild constipation or moderate constipation.    [provider]  levothyroxine (SYNTHROID) 50 MCG tablet Take 1 tablet by mouth once daily 05/23/22   Ladene Artist, MD  lidocaine-prilocaine (EMLA) cream Apply 1 Application topically as needed. Apply to port site 1-2 hours prior to use Patient taking differently: Apply 1 Application topically as needed (for port access- one to two hours prior to accessing). 12/12/21   Rana Snare, NP  magic mouthwash SOLN Take 5 mLs by mouth 4 (four) times daily as needed for mouth pain. 5 ml swish and spit 4 times a daily as needed for mouth pain 09/14/20   Ladene Artist, MD  magnesium oxide (MAG-OX) 400 (240 Mg) MG tablet Take 1 tablet (400 mg total) by mouth 2 (two) times daily. 02/22/22   Ladene Artist, MD  Nutritional Supplements (ENSURE ORIGINAL) LIQD Take 237 mLs by mouth See admin instructions. Drink 237 ml's (1 CHOCOLATE shake) by mouth once a day    [provider]  oxyCODONE (OXY IR/ROXICODONE) 5 MG immediate release tablet Take 1 tablet (5 mg total) by mouth every 4 (four) hours as needed for severe pain. 05/10/22   Rana Snare, NP  potassium chloride (MICRO-K) 10 MEQ CR capsule Take 10 mEq by mouth daily.    [provider]  predniSONE (DELTASONE) 5 MG tablet Take 1 tablet by mouth once daily with breakfast 03/15/22   Ladene Artist, MD  prochlorperazine (COMPAZINE) 10 MG tablet Take 1 tablet (10 mg total) by mouth every 6 (six) hours as needed for nausea or vomiting. 03/01/21   Rana Snare, NP  Spacer/Aero-Holding Chambers (AEROCHAMBER MV) inhaler Use as instructed 12/31/19   Ladene Artist, MD  SYSTANE ULTRA PF 0.4-0.3 % SOLN Place 1 drop into both eyes 3 (three) times daily as needed (for dryness).    [provider]     Vital Signs: Vitals:   05/25/22 1055  BP: 106/63  Pulse: 80  Resp: 18   Temp: 98 F (36.7 C)  SpO2: 98%        Code Status: FULL CODE  Physical Exam: awake/alert; jaundiced/scleral icterus;  chest-  dim BS rt base, left clear; intact rt chest wall port a cath; heart- RRR; abd- soft,+BS, intact rt biliary drains, green bile in bags, site NT; no LE edema   Imaging: No results found.  Labs:  CBC: Recent Labs    05/04/22 0601 05/10/22 1410 05/11/22 1145 05/18/22 1237  WBC 4.2 3.5* 3.1* 4.5  HGB 7.4* 9.2* 8.6* 10.6*  HCT 23.4* 27.9* 26.9* 32.1*  PLT 113* 104* 97* 121*    COAGS: Recent Labs    04/17/22 0841 04/24/22 0500 04/30/22 0500 05/11/22 1145  INR 1.1 1.2 1.4* 1.6*    BMP: Recent Labs    05/04/22 0601 05/10/22 1410 05/11/22 1145 05/18/22 1237  NA 135 140 140 140  K 3.3* 3.1* 2.8* 3.3*  CL 112* 109 108 104  CO2 19* 23  24 26  GLUCOSE 94 115* 104* 82  BUN CALCIUM 7.4* 8.1* 7.8* 8.2*  CREATININE 0.52 0.55 0.58 0.72  GFRNONAA >60 >60 >60 >60    LIVER FUNCTION TESTS: Recent Labs    05/04/22 0601 05/10/22 1410 05/11/22 1145 05/18/22 1237  BILITOT 12.0* 11.8* 9.9* 11.5*  AST 68* 82* 76* 70*  ALT 45* 48* 44 35  ALKPHOS 115 197* 157* 136*  PROT 5.0* 5.4* 5.5* 5.7*  ALBUMIN 1.6* 2.9* 2.0* 2.8*    Assessment and Plan: 72 yo female with history of 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  She presents now with rising total bilirubin, currently 11.5 and is scheduled today for follow-up CT abdomen pelvis, biliary drain exchange and repeat LFTs. Above plans d/w pt with her understanding and consent.    LABS PENDING   Electronically Signed: D. Jeananne Rama,  PA-C 05/24/2022, 2:56 PM   I spent a total of 20 minutes at the the patient's bedside AND on the patient's hospital floor or unit, greater than 50% of which was counseling/coordinating care for biliary drains

## 2022-05-25 ENCOUNTER — Ambulatory Visit (HOSPITAL_COMMUNITY)
Admission: RE | Admit: 2022-05-25 | Discharge: 2022-05-25 | Disposition: A | Discharge status: Home / Self Care | Payer: Medicare Other | Source: Ambulatory Visit | Attending: Interventional Radiology | Admitting: Interventional Radiology

## 2022-05-25 ENCOUNTER — Other Ambulatory Visit: Payer: Self-pay

## 2022-05-25 ENCOUNTER — Encounter (HOSPITAL_COMMUNITY): Payer: Self-pay

## 2022-05-25 ENCOUNTER — Inpatient Hospital Stay: Payer: Medicare Other

## 2022-05-25 ENCOUNTER — Inpatient Hospital Stay: Payer: Medicare Other | Admitting: Nurse Practitioner

## 2022-05-25 DIAGNOSIS — C20 Malignant neoplasm of rectum: Secondary | ICD-10-CM | POA: Insufficient documentation

## 2022-05-25 DIAGNOSIS — K831 Obstruction of bile duct: Secondary | ICD-10-CM | POA: Diagnosis present

## 2022-05-25 DIAGNOSIS — C787 Secondary malignant neoplasm of liver and intrahepatic bile duct: Secondary | ICD-10-CM | POA: Diagnosis present

## 2022-05-25 LAB — CBC WITH DIFFERENTIAL/PLATELET
Abs Immature Granulocytes: 0.02 10*3/uL (ref 0.00–0.07)
Basophils Absolute: 0 10*3/uL (ref 0.0–0.1)
Basophils Relative: 0 %
Eosinophils Absolute: 0 10*3/uL (ref 0.0–0.5)
Eosinophils Relative: 0 %
HCT: 34.1 % — ABNORMAL LOW (ref 36.0–46.0)
Hemoglobin: 11.3 g/dL — ABNORMAL LOW (ref 12.0–15.0)
Immature Granulocytes: 1 %
Lymphocytes Relative: 24 %
Lymphs Abs: 1 10*3/uL (ref 0.7–4.0)
MCH: 35.4 pg — ABNORMAL HIGH (ref 26.0–34.0)
MCHC: 33.1 g/dL (ref 30.0–36.0)
MCV: 106.9 fL — ABNORMAL HIGH (ref 80.0–100.0)
Monocytes Absolute: 0.6 10*3/uL (ref 0.1–1.0)
Monocytes Relative: 14 %
Neutro Abs: 2.5 10*3/uL (ref 1.7–7.7)
Neutrophils Relative %: 61 %
Platelets: 106 10*3/uL — ABNORMAL LOW (ref 150–400)
RBC: 3.19 MIL/uL — ABNORMAL LOW (ref 3.87–5.11)
RDW: 15.6 % — ABNORMAL HIGH (ref 11.5–15.5)
WBC: 4.1 10*3/uL (ref 4.0–10.5)
nRBC: 0 % (ref 0.0–0.2)

## 2022-05-25 LAB — COMPREHENSIVE METABOLIC PANEL
ALT: 39 U/L (ref 0–44)
AST: 78 U/L — ABNORMAL HIGH (ref 15–41)
Albumin: 2.4 g/dL — ABNORMAL LOW (ref 3.5–5.0)
Alkaline Phosphatase: 118 U/L (ref 38–126)
Anion gap: 11 (ref 5–15)
BUN: 13 mg/dL (ref 8–23)
CO2: 24 mmol/L (ref 22–32)
Calcium: 8.1 mg/dL — ABNORMAL LOW (ref 8.9–10.3)
Chloride: 101 mmol/L (ref 98–111)
Creatinine, Ser: 0.82 mg/dL (ref 0.44–1.00)
GFR, Estimated: >60 mL/min (ref >60)
Glucose, Bld: 89 mg/dL (ref 70–99)
Potassium: 2.7 mmol/L — CL (ref 3.5–5.1)
Sodium: 136 mmol/L (ref 135–145)
Total Bilirubin: 11.1 mg/dL — ABNORMAL HIGH (ref 0.3–1.2)
Total Protein: 6.1 g/dL — ABNORMAL LOW (ref 6.5–8.1)

## 2022-05-25 LAB — PROTIME-INR
INR: 1.5 — ABNORMAL HIGH (ref 0.8–1.2)
Prothrombin Time: 18 seconds — ABNORMAL HIGH (ref 11.4–15.2)

## 2022-05-25 MED ORDER — IOHEXOL 300 MG/ML  SOLN
100.0000 mL | Freq: Once | INTRAMUSCULAR | Status: AC | PRN
  Administered 2022-05-25: 100 mL via INTRAVENOUS

## 2022-05-25 MED ORDER — SODIUM CHLORIDE 0.9 % IV SOLN: 2.0000 g | Freq: Once | INTRAVENOUS | Status: DC

## 2022-05-25 MED ORDER — POTASSIUM CHLORIDE CRYS ER 20 MEQ PO TBCR
30.0000 meq | EXTENDED_RELEASE_TABLET | Freq: Once | ORAL | Status: AC
  Administered 2022-05-25: 30 meq via ORAL
  Filled 2022-05-25: qty 1

## 2022-05-25 MED ORDER — SODIUM CHLORIDE 0.9 % IV SOLN: INTRAVENOUS | Status: DC

## 2022-05-29 ENCOUNTER — Other Ambulatory Visit (HOSPITAL_COMMUNITY): Payer: Self-pay

## 2022-05-29 ENCOUNTER — Other Ambulatory Visit: Payer: Self-pay | Admitting: *Deleted

## 2022-05-29 DIAGNOSIS — C20 Malignant neoplasm of rectum: Secondary | ICD-10-CM

## 2022-05-29 NOTE — Progress Notes (Signed)
ref

## 2022-05-30 ENCOUNTER — Telehealth: Payer: Self-pay | Admitting: Oncology

## 2022-05-31 ENCOUNTER — Other Ambulatory Visit: Payer: Self-pay

## 2022-05-31 ENCOUNTER — Other Ambulatory Visit (HOSPITAL_COMMUNITY): Payer: Self-pay

## 2022-06-01 ENCOUNTER — Inpatient Hospital Stay: Payer: Medicare Other | Admitting: Oncology

## 2022-06-01 ENCOUNTER — Inpatient Hospital Stay: Payer: Medicare Other | Attending: Oncology

## 2022-06-01 ENCOUNTER — Inpatient Hospital Stay: Payer: Medicare Other

## 2022-06-01 VITALS — BP 126/87 | HR 80 | Temp 97.9°F | Resp 18 | Ht 62.0 in | Wt 175.0 lb

## 2022-06-01 DIAGNOSIS — Z95828 Presence of other vascular implants and grafts: Secondary | ICD-10-CM

## 2022-06-01 DIAGNOSIS — C7801 Secondary malignant neoplasm of right lung: Secondary | ICD-10-CM | POA: Insufficient documentation

## 2022-06-01 DIAGNOSIS — C787 Secondary malignant neoplasm of liver and intrahepatic bile duct: Secondary | ICD-10-CM | POA: Insufficient documentation

## 2022-06-01 DIAGNOSIS — I864 Gastric varices: Secondary | ICD-10-CM | POA: Insufficient documentation

## 2022-06-01 DIAGNOSIS — K859 Acute pancreatitis without necrosis or infection, unspecified: Secondary | ICD-10-CM | POA: Diagnosis not present

## 2022-06-01 DIAGNOSIS — E039 Hypothyroidism, unspecified: Secondary | ICD-10-CM | POA: Insufficient documentation

## 2022-06-01 DIAGNOSIS — C7802 Secondary malignant neoplasm of left lung: Secondary | ICD-10-CM | POA: Diagnosis not present

## 2022-06-01 DIAGNOSIS — D6959 Other secondary thrombocytopenia: Secondary | ICD-10-CM | POA: Insufficient documentation

## 2022-06-01 DIAGNOSIS — K1230 Oral mucositis (ulcerative), unspecified: Secondary | ICD-10-CM | POA: Diagnosis not present

## 2022-06-01 DIAGNOSIS — C20 Malignant neoplasm of rectum: Secondary | ICD-10-CM

## 2022-06-01 LAB — CBC WITH DIFFERENTIAL (CANCER CENTER ONLY)
Abs Immature Granulocytes: 0.02 10*3/uL (ref 0.00–0.07)
Basophils Absolute: 0 10*3/uL (ref 0.0–0.1)
Basophils Relative: 0 %
Eosinophils Absolute: 0.1 10*3/uL (ref 0.0–0.5)
Eosinophils Relative: 1 %
HCT: 36.6 % (ref 36.0–46.0)
Hemoglobin: 12.5 g/dL (ref 12.0–15.0)
Immature Granulocytes: 0 %
Lymphocytes Relative: 21 %
Lymphs Abs: 1.2 10*3/uL (ref 0.7–4.0)
MCH: 35.6 pg — ABNORMAL HIGH (ref 26.0–34.0)
MCHC: 34.2 g/dL (ref 30.0–36.0)
MCV: 104.3 fL — ABNORMAL HIGH (ref 80.0–100.0)
Monocytes Absolute: 0.8 10*3/uL (ref 0.1–1.0)
Monocytes Relative: 14 %
Neutro Abs: 3.8 10*3/uL (ref 1.7–7.7)
Neutrophils Relative %: 64 %
Platelet Count: 133 10*3/uL — ABNORMAL LOW (ref 150–400)
RBC: 3.51 MIL/uL — ABNORMAL LOW (ref 3.87–5.11)
RDW: 15.1 % (ref 11.5–15.5)
WBC Count: 5.9 10*3/uL (ref 4.0–10.5)
nRBC: 0 % (ref 0.0–0.2)

## 2022-06-01 LAB — CMP (CANCER CENTER ONLY)
ALT: 36 U/L (ref 0–44)
AST: 70 U/L — ABNORMAL HIGH (ref 15–41)
Albumin: 3.1 g/dL — ABNORMAL LOW (ref 3.5–5.0)
Alkaline Phosphatase: 127 U/L — ABNORMAL HIGH (ref 38–126)
Anion gap: 11 (ref 5–15)
BUN: 19 mg/dL (ref 8–23)
CO2: 23 mmol/L (ref 22–32)
Calcium: 8.6 mg/dL — ABNORMAL LOW (ref 8.9–10.3)
Chloride: 103 mmol/L (ref 98–111)
Creatinine: 0.94 mg/dL (ref 0.44–1.00)
GFR, Estimated: >60 mL/min (ref >60)
Glucose, Bld: 96 mg/dL (ref 70–99)
Potassium: 3.9 mmol/L (ref 3.5–5.1)
Sodium: 137 mmol/L (ref 135–145)
Total Bilirubin: 11.4 mg/dL (ref 0.3–1.2)
Total Protein: 6.2 g/dL — ABNORMAL LOW (ref 6.5–8.1)

## 2022-06-01 MED ORDER — SODIUM CHLORIDE 0.9% FLUSH
10.0000 mL | INTRAVENOUS | Status: DC | PRN
  Administered 2022-06-01: 10 mL via INTRAVENOUS

## 2022-06-01 MED ORDER — HEPARIN SOD (PORK) LOCK FLUSH 100 UNIT/ML IV SOLN
500.0000 [IU] | Freq: Once | INTRAVENOUS | Status: AC
  Administered 2022-06-01: 500 [IU] via INTRAVENOUS

## 2022-06-01 MED ORDER — OXYCODONE HCL 5 MG PO TABS
5.0000 mg | ORAL_TABLET | ORAL | 0 refills | Status: DC | PRN
Start: 2022-06-01 — End: 2022-07-17

## 2022-06-01 NOTE — Progress Notes (Signed)
Samantha Lutz   Diagnosis: Rectal cancer  INTERVAL HISTORY:   Samantha Lutz returns for a scheduled visit.  She is here with her daughters.  She reports pain in the right flank area where the pain is relieved with oxycodone.  The biliary drains are functioning.  She has a poor appetite and energy level. She was evaluated by Dr. Grace Isaac for a left-sided biliary drain on 05/25/2022.  He feels this would be a difficult procedure and may not relieve the hyperbilirubinemia.  She decided against the procedure. Objective:  Vital signs in last 24 hours:  Blood pressure 126/87, pulse 80, temperature 97.9 F (36.6 C), temperature source Oral, resp. rate 18, height 5\' 2"  (1.575 m), weight 175 lb (79.4 kg), SpO2 100 %.    HEENT: Oral and scleral icterus Resp: Decreased breath sounds with end inspiratory rhonchi at the right lower posterior chest, no respiratory distress Cardio: Regular rate and rhythm GI: No hepatosplenomegaly, nontender, right subcostal biliary drain site without evidence of infection Vascular: No leg edema Musculoskeletal: No tenderness at the right flank.  No mass or rash Skin: Jaundice  Portacath/PICC-without erythema  Lab Results:  Lab Results  Component Value Date   WBC 5.9 06/01/2022   HGB 12.5 06/01/2022   HCT 36.6 06/01/2022   MCV 104.3 (H) 06/01/2022   PLT 133 (L) 06/01/2022   NEUTROABS 3.8 06/01/2022    CMP  Lab Results  Component Value Date   NA 137 06/01/2022   K 3.9 06/01/2022   CL 103 06/01/2022   CO2 23 06/01/2022   GLUCOSE 96 06/01/2022   BUN 19 06/01/2022   CREATININE 0.94 06/01/2022   CALCIUM 8.6 (L) 06/01/2022   PROT 6.2 (L) 06/01/2022   ALBUMIN 3.1 (L) 06/01/2022   AST 70 (H) 06/01/2022   ALT 36 06/01/2022   ALKPHOS 127 (H) 06/01/2022   BILITOT 11.4 (HH) 06/01/2022   GFRNONAA >60 06/01/2022   GFRAA >60 10/28/2019    Lab Results  Component Value Date   CEA1 <1.00 06/01/2020   CEA <1.00 06/01/2020     Lab Results  Component Value Date   INR 1.5 (H) 05/25/2022   LABPROT 18.0 (H) 05/25/2022    Imaging:  No results found.  Medications: I have reviewed the patient's current medications.   Assessment/Plan: Rectal cancer Mass at 7 cm from the anal verge on colonoscopy 08/09/2017, biopsy revealed invasive adenocarcinoma Staging CTs 08/17/2017-no evidence of metastatic disease, asymmetric thickening in the mid rectum MR pelvis 09/01/2017, T3N0 lesion beginning at 6.3 cm from the anal sphincter Radiation/Xeloda initiated 09/17/2017, completed 10/25/2017 Low anterior resection/diverting ileostomy 12/21/2017,ypT3,ypN1a tumor.  Lymphovascular invasion present, intact mismatch repair protein expression, treatment effect present (TRS 1).  Mismatch repair protein IHC normal; Foundation 1-KRAS/NRAS wild-type, microsatellite status and tumor mutational burden could not be determined. Cycle 1 adjuvant Xeloda beginning 01/21/2018 Cycle 2 adjuvant Xeloda beginning 02/11/2018 Xeloda discontinued after cycle 2 secondary to patient preference Ileostomy takedown 07/10/2018 CTs 09/07/2018- multiple live small pulmonary nodules concerning for metastatic disease CT chest 12/10/2018-multiple bilateral lung nodules, some have increased in size CT chest 04/08/2019-mild enlargement of bilateral lung nodules Status post SBRT multiple lung nodules 05/06/2019, 05/08/2019, 05/13/2019 CT chest 08/28/2019-improvement and resolution in majority of right-sided pulmonary nodules, a superior segment right lower lobe nodule has increased, no new right-sided nodules.  Progressive enlargement of left-sided pulmonary nodules PET scan 09/09/2019-hypermetabolic right lower lobe nodule, 2 hypermetabolic left upper lobe nodules with an additional 0.7 cm left upper lobe  nodule below PET resolution, groundglass opacity in the mid to right lower lobe with associated hypermetabolism consistent with postradiation change, hypermetabolic central  segment 4A liver lesion Cycle 1 FOLFOX 10/14/2019 Cycle  2FOLFOX 11/11/2019, 5-FU and oxaliplatin dose reduced secondary to neutropenia and thrombocytopenia following cycle one, G-CSF declined Cycle 3 FOLFOX 11/26/2019 Cycle 4 FOLFOX 12/11/2019, oxaliplatin held due to neutropenia and thrombocytopenia Cycle 5 FOLFOX 12/31/2019 Cycle 6 FOLFOX 01/14/2020 (oxaliplatin held, 5-fluorouracil dose reduced due to mucositis) CT chest 01/27/2020-improvement in left lung nodules, progressive airspace disease with traction bronchiectasis throughout the right lung, previously noted right lung nodules are obscured, mildly enlarged right paratracheal lymph nodes-not hypermetabolic on prior PET, likely reactive Cycle 7 FOLFOX 01/28/2020 Cycle 8 FOLFOX 02/11/2020 (oxaliplatin held due to neutropenia and thrombocytopenia) Cycle 9 FOLFOX 02/25/2020 (oxaliplatin held due to thrombocytopenia) Cycle 10 FOLFOX 03/11/2020 (oxaliplatin held secondary to neuropathy) Cycle 11 FOLFOX 03/25/2020 (oxaliplatin held due to neuropathy) CT chest 04/05/2020-no new or progressive findings.  Interval evolution of presumed postradiation scarring in the right perihilar lung with decrease in the more diffuse groundglass opacity seen previously.  No substantial change in left lung nodules. 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  No definite CT findings to suggest recurrent tumor.  Stable small left upper lobe pulmonary nodules.  No new or progressive findings.  Stable small right paratracheal lymph nodes.  Stable area of irregular low-attenuation in hepatic segment 4A, site of known prior hepatic metastatic lesion.  No new or progressive  findings. Cycle 18 5-fluorouracil 07/06/2020 Cycle 19 5-fluorouracil 07/20/2020 Cycle 20 5-fluorouracil 08/03/2020 Cycle 21 5-fluorouracil 08/17/2020  Cycle 22 5-fluorouracil 08/31/2020 Cycle 23 5-fluorouracil 09/14/2020 CT chest 09/24/2020-increased number and size of pulmonary nodules bilaterally.  Findings in the right upper lobe are concerning for potential lymphangitic spread of tumor.  Right upper lobe findings could also reflect evolving postradiation change. Cycle 1 FOLFIRI 10/12/2020 Cycle 2 FOLFIRI 11/08/2020, irinotecan dose reduced due to neutropenia and thrombocytopenia following cycle 1 Cycle 3 FOLFIRI 12/01/2020 Cycle 4 FOLFIRI 12/22/2020 CT chest 01/11/2021-similar bilateral pulmonary nodules.  Left upper lobe nodule has mildly decreased in size.  No new nodules. Cycle 5 FOLFIRI 01/12/2021 Cycle 6 FOLFIRI 02/03/2021 Cycle 7 FOLFIRI 03/01/2021 Cycle 8 FOLFIRI 03/22/2021 CT chest 04/08/2021-multiple small bilateral lung nodules not significantly changed.  No new nodules.  Unchanged enlarged pretracheal nodes.  Increase in fibrotic consolidation of the perihilar and inferior right lung. Cycle 9 FOLFIRI 04/12/2021 05/04/2021 treatment held due to fatigue Cycle 10 FOLFIRI 05/18/2021 06/07/2021-treatment held secondary to neutropenia Cycle 11 FOLFIRI 06/14/2021 Cycle 12 FOLFIRI 07/05/2021 Cycle 13 FOLFIRI 07/27/2021, irinotecan held 14 FOLFIRI 08/16/2021, irinotecan held CT chest 09/02/2021-enlargement of several lung nodules, no new nodules, stable postradiation changes in the right lower lobe and right middle lobe, gastroesophageal varices identified Cycle one 5-FU/Panitumumab 11/01/2021 Cycle two 5-FU/panitumumab 11/29/2021-Solu-Medrol and Pepcid prophylaxis added after she developed a cough during the panitumumab with cycle 1 Cycle 3 5-FU/panitumumab 12/12/2021 Cycle four 5-FU/panitumumab 12/27/2021 CT chest 01/25/2022-mild progression of metastatic disease to the lungs with increased size but stable  number of metastatic lesions. Cycle 1 Lonsurf 02/27/2022 Cycle 2 Lonsurf 04/10/2022, placed on hold 04/13/2022 due to LFT abnormalities CT abdomen/pelvis 04/13/2022-persistent/chronic intrahepatic biliary dilatation most notable left hepatic lobe; vague area of low-attenuation near the porta hepatis, MRI recommended.  MRI liver 04/14/2022-hypoenhancing 2.7 x 1.9 cm central liver mass in the region of the biliary hilum.  Associated high-grade biliary stricture at the biliary hilum affecting the central right and left intrahepatic bile ducts. CT abdomen/pelvis 05/25/2022 mild left intraparotid duct dilation with an internal/external biliary drain unchanged, multifocal pulmonary metastases unchanged, trace right pleural effusion       2.   Hypothyroid   3.    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  Port-A-Cath placement 09/29/2019, interventional radiology   5.  Neutropenia and thrombocytopenia following cycle 1 FOLFOX-plan chemotherapy dose reductions, she declined G-CSF   6.  Mucositis following cycle 5 FOLFOX, 5-fluorouracil dose reduced with cycle 6 Mucositis following cycle 22 5-fluorouracil-Magic mouthwash added   7.  Right lung airspace disease/volume loss-likely toxicity from chest radiation, trial of prednisone 01/29/2020; dyspnea and cough improved 02/11/2020 Progressive cough 03/11/2020-prednisone resumed at a dose of 20 mg daily Cough and dyspnea improved 03/25/2020-prednisone taper to 15 mg daily Improved 04/06/2020-prednisone taper to 10 mg daily Stable 04/21/2020-prednisone taper to 5 mg daily   8.  Gastroesophageal varices    9.   Admission 04/17/2022 with obstructive jaundice secondary to a central liver mass ERCP 04/18/2022-biliary obstruction secondary to a central liver mass, status post stent placement MRI/MRCP-paraesophageal varices, or a stent extending from the duodenum to the confluence of the right and left hepatic duct stent does not extend  into either the right or left duct, 4 x 1.7 cm central liver mass, suspicion for left portal vein thrombosis, mild pancreatic and peripancreatic edema, esophagus and gastric varices EGD removal of biliary stent 04/21/2022 Placement of right external biliary drain 04/21/2022 Placement of new right external/internal biliary drain 04/26/2022   10.  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  Pancreatitis      Disposition: Samantha Lutz has metastatic rectal cancer.  She has persistent hyperbilirubinemia secondary to a central obstructing liver lesion.  A right biliary drain is in place, but this has not relieved the hyperbilirubinemia.  She had extensive discussions with Dr. Grace Isaac regarding an attempt at left biliary drain placement.  She has decided against this procedure due to the risk associated with the procedure and unclear outcome.  I discussed treatment options with Samantha Lutz and her daughters.  We discussed a trial of salvage systemic therapy versus hospice care.  She did not develop progressive disease while on FOLFOX.  I recommend repeat treatment with FOLFOX with the addition of bevacizumab.  We reviewed potential toxicities associated with this regimen including the chance of an allergic reaction to oxaliplatin and progressive neuropathy symptoms.  She does not want to make a decision today.  She would like to return in 3 weeks for further discussion.  She will continue oxycodone as needed for the flank pain.  I suspect the pain is related to pleural tumor in the right chest.  Thornton Papas, MD  06/01/2022  12:16 PM

## 2022-06-01 NOTE — Progress Notes (Signed)
Pt left accessed from lab/flush appointment. Kem Boroughs, RN notified and acknowledged

## 2022-06-01 NOTE — Progress Notes (Signed)
CRITICAL VALUE STICKER  CRITICAL VALUE: T. Bili 11.4  RECEIVER (on-site recipient of call):Jenaye Rickert,RN  DATE & TIME NOTIFIED: 06/01/22 @ 1155  MESSENGER (representative from lab):Maurine Minister  MD NOTIFIED: Dr. Truett Perna  TIME OF NOTIFICATION:1157  RESPONSE:  Stable

## 2022-06-02 ENCOUNTER — Other Ambulatory Visit: Payer: Self-pay

## 2022-06-06 ENCOUNTER — Other Ambulatory Visit (HOSPITAL_COMMUNITY): Payer: Self-pay

## 2022-06-07 ENCOUNTER — Encounter: Payer: Medicare Other | Admitting: Nutrition

## 2022-06-07 ENCOUNTER — Inpatient Hospital Stay: Payer: Medicare Other | Admitting: Nutrition

## 2022-06-07 NOTE — Progress Notes (Signed)
72 yo female diagnosed with Rectal cancer and followed by Dr. Truett Perna.  Treatment is pending patient's decision.  PMH includes Anemia and Hypothyroidism.  Medications include Dulcolax, Synthroid, MMW, Mag OX, Micro K, Prednisone and compazine.  Labs reviewed. Noted albumin 3.1  Height: 5'2". Weight: 175 pounds May 2. Weight of 217# on March 29. UBW: 200-210 pounds. BMI: 32.01  Patient reports her appetite is improving and she is starting to eat 3 meals daily instead of 2. The amounts of food are still small. She denies nausea, vomiting, constipation, and diarrhea. She drinks one Original Ensure daily. States she doesn't like the shakes that are too thick. She endorses quick unintentional wt loss.  Nutrition Diagnosis: Unintended wt loss related to cancer and associated treatments as evidenced by 19% wt loss in less than 3 months.  Intervention: Educated to consume smaller, more frequent meals and snacks with high calorie, high protein foods. Increase Ensure to TID between meals. Suggested she could switch to Ensure Plus or equivalent and thin down with milk. Emailed nutrition fact sheets.   Monitoring, Evaluation, Goals: Increase calories and protein to minimize further wt loss.  Next Visit: Will monitor for plan of care and follow as needed.

## 2022-06-08 ENCOUNTER — Other Ambulatory Visit: Payer: Self-pay

## 2022-06-13 ENCOUNTER — Other Ambulatory Visit: Payer: Self-pay

## 2022-06-21 ENCOUNTER — Inpatient Hospital Stay: Payer: Medicare Other

## 2022-06-21 ENCOUNTER — Encounter: Payer: Self-pay | Admitting: Nurse Practitioner

## 2022-06-21 ENCOUNTER — Other Ambulatory Visit: Payer: Self-pay | Admitting: *Deleted

## 2022-06-21 ENCOUNTER — Inpatient Hospital Stay: Payer: Medicare Other | Admitting: Nurse Practitioner

## 2022-06-21 DIAGNOSIS — C20 Malignant neoplasm of rectum: Secondary | ICD-10-CM

## 2022-06-21 MED ORDER — PROCHLORPERAZINE MALEATE 10 MG PO TABS
10.0000 mg | ORAL_TABLET | Freq: Four times a day (QID) | ORAL | 2 refills | Status: DC | PRN
Start: 2022-06-21 — End: 2022-07-20

## 2022-06-21 MED ORDER — PREDNISONE 5 MG PO TABS
5.0000 mg | ORAL_TABLET | Freq: Every day | ORAL | 2 refills | Status: DC
Start: 2022-06-21 — End: 2022-07-20

## 2022-06-22 ENCOUNTER — Other Ambulatory Visit (HOSPITAL_COMMUNITY): Payer: Self-pay

## 2022-06-22 ENCOUNTER — Other Ambulatory Visit: Payer: Self-pay | Admitting: Internal Medicine

## 2022-06-23 ENCOUNTER — Other Ambulatory Visit: Payer: Self-pay

## 2022-06-28 ENCOUNTER — Other Ambulatory Visit: Payer: Self-pay

## 2022-06-28 ENCOUNTER — Other Ambulatory Visit: Payer: Self-pay | Admitting: Radiology

## 2022-06-28 ENCOUNTER — Other Ambulatory Visit: Payer: Self-pay | Admitting: Internal Medicine

## 2022-06-28 DIAGNOSIS — K831 Obstruction of bile duct: Secondary | ICD-10-CM

## 2022-06-28 DIAGNOSIS — C20 Malignant neoplasm of rectum: Secondary | ICD-10-CM

## 2022-06-28 MED ORDER — ONDANSETRON HCL 8 MG PO TABS
8.0000 mg | ORAL_TABLET | Freq: Three times a day (TID) | ORAL | 1 refills | Status: DC | PRN
Start: 2022-06-28 — End: 2022-07-20

## 2022-06-29 ENCOUNTER — Encounter (HOSPITAL_COMMUNITY): Payer: Self-pay

## 2022-06-29 ENCOUNTER — Inpatient Hospital Stay (HOSPITAL_COMMUNITY)
Admission: EM | Admit: 2022-06-29 | Discharge: 2022-07-01 | DRG: 445 | Disposition: A | Discharge status: Hospice | Payer: Medicare Other | Attending: Internal Medicine | Admitting: Internal Medicine

## 2022-06-29 ENCOUNTER — Ambulatory Visit (HOSPITAL_COMMUNITY): Payer: Medicare Other

## 2022-06-29 ENCOUNTER — Inpatient Hospital Stay (HOSPITAL_COMMUNITY)
Admission: RE | Admit: 2022-06-29 | Discharge: 2022-06-29 | Disposition: A | Discharge status: Home / Self Care | Payer: Medicare Other | Source: Ambulatory Visit | Attending: Internal Medicine | Admitting: Internal Medicine

## 2022-06-29 DIAGNOSIS — Z8261 Family history of arthritis: Secondary | ICD-10-CM

## 2022-06-29 DIAGNOSIS — D689 Coagulation defect, unspecified: Secondary | ICD-10-CM | POA: Diagnosis present

## 2022-06-29 DIAGNOSIS — R7989 Other specified abnormal findings of blood chemistry: Secondary | ICD-10-CM | POA: Diagnosis present

## 2022-06-29 DIAGNOSIS — E871 Hypo-osmolality and hyponatremia: Secondary | ICD-10-CM | POA: Diagnosis present

## 2022-06-29 DIAGNOSIS — Z803 Family history of malignant neoplasm of breast: Secondary | ICD-10-CM

## 2022-06-29 DIAGNOSIS — Z823 Family history of stroke: Secondary | ICD-10-CM

## 2022-06-29 DIAGNOSIS — I10 Essential (primary) hypertension: Secondary | ICD-10-CM | POA: Diagnosis present

## 2022-06-29 DIAGNOSIS — T859XXA Unspecified complication of internal prosthetic device, implant and graft, initial encounter: Secondary | ICD-10-CM | POA: Diagnosis present

## 2022-06-29 DIAGNOSIS — Z79890 Hormone replacement therapy (postmenopausal): Secondary | ICD-10-CM

## 2022-06-29 DIAGNOSIS — K831 Obstruction of bile duct: Secondary | ICD-10-CM | POA: Diagnosis not present

## 2022-06-29 DIAGNOSIS — R627 Adult failure to thrive: Secondary | ICD-10-CM | POA: Diagnosis present

## 2022-06-29 DIAGNOSIS — Z7189 Other specified counseling: Secondary | ICD-10-CM

## 2022-06-29 DIAGNOSIS — C20 Malignant neoplasm of rectum: Secondary | ICD-10-CM | POA: Diagnosis present

## 2022-06-29 DIAGNOSIS — Z6841 Body Mass Index (BMI) 40.0 and over, adult: Secondary | ICD-10-CM

## 2022-06-29 DIAGNOSIS — Z79899 Other long term (current) drug therapy: Secondary | ICD-10-CM

## 2022-06-29 DIAGNOSIS — C7802 Secondary malignant neoplasm of left lung: Secondary | ICD-10-CM | POA: Diagnosis present

## 2022-06-29 DIAGNOSIS — Z888 Allergy status to other drugs, medicaments and biological substances status: Secondary | ICD-10-CM

## 2022-06-29 DIAGNOSIS — C7801 Secondary malignant neoplasm of right lung: Secondary | ICD-10-CM | POA: Diagnosis present

## 2022-06-29 DIAGNOSIS — R9431 Abnormal electrocardiogram [ECG] [EKG]: Secondary | ICD-10-CM | POA: Diagnosis present

## 2022-06-29 DIAGNOSIS — Z8249 Family history of ischemic heart disease and other diseases of the circulatory system: Secondary | ICD-10-CM

## 2022-06-29 DIAGNOSIS — Z515 Encounter for palliative care: Secondary | ICD-10-CM

## 2022-06-29 DIAGNOSIS — E861 Hypovolemia: Secondary | ICD-10-CM | POA: Diagnosis present

## 2022-06-29 DIAGNOSIS — E860 Dehydration: Secondary | ICD-10-CM | POA: Diagnosis present

## 2022-06-29 DIAGNOSIS — E039 Hypothyroidism, unspecified: Secondary | ICD-10-CM | POA: Diagnosis present

## 2022-06-29 DIAGNOSIS — Z83511 Family history of glaucoma: Secondary | ICD-10-CM

## 2022-06-29 DIAGNOSIS — N179 Acute kidney failure, unspecified: Principal | ICD-10-CM

## 2022-06-29 DIAGNOSIS — W19XXXA Unspecified fall, initial encounter: Secondary | ICD-10-CM

## 2022-06-29 DIAGNOSIS — Z91040 Latex allergy status: Secondary | ICD-10-CM

## 2022-06-29 DIAGNOSIS — Z833 Family history of diabetes mellitus: Secondary | ICD-10-CM

## 2022-06-29 DIAGNOSIS — Z825 Family history of asthma and other chronic lower respiratory diseases: Secondary | ICD-10-CM

## 2022-06-29 DIAGNOSIS — D6959 Other secondary thrombocytopenia: Secondary | ICD-10-CM | POA: Diagnosis present

## 2022-06-29 DIAGNOSIS — E872 Acidosis, unspecified: Secondary | ICD-10-CM | POA: Diagnosis present

## 2022-06-29 DIAGNOSIS — C787 Secondary malignant neoplasm of liver and intrahepatic bile duct: Secondary | ICD-10-CM | POA: Diagnosis present

## 2022-06-29 DIAGNOSIS — T148XXA Other injury of unspecified body region, initial encounter: Secondary | ICD-10-CM

## 2022-06-29 NOTE — ED Triage Notes (Signed)
Pt presents to ED for evaluation of mid to right back pain from mechanical fall earlier today .

## 2022-06-30 ENCOUNTER — Other Ambulatory Visit: Payer: Self-pay

## 2022-06-30 ENCOUNTER — Emergency Department (HOSPITAL_COMMUNITY): Payer: Medicare Other

## 2022-06-30 ENCOUNTER — Inpatient Hospital Stay (HOSPITAL_COMMUNITY): Payer: Medicare Other

## 2022-06-30 DIAGNOSIS — C787 Secondary malignant neoplasm of liver and intrahepatic bile duct: Secondary | ICD-10-CM | POA: Diagnosis present

## 2022-06-30 DIAGNOSIS — Z888 Allergy status to other drugs, medicaments and biological substances status: Secondary | ICD-10-CM | POA: Diagnosis not present

## 2022-06-30 DIAGNOSIS — E872 Acidosis, unspecified: Secondary | ICD-10-CM | POA: Diagnosis present

## 2022-06-30 DIAGNOSIS — Z833 Family history of diabetes mellitus: Secondary | ICD-10-CM | POA: Diagnosis not present

## 2022-06-30 DIAGNOSIS — E871 Hypo-osmolality and hyponatremia: Secondary | ICD-10-CM | POA: Diagnosis present

## 2022-06-30 DIAGNOSIS — D689 Coagulation defect, unspecified: Secondary | ICD-10-CM | POA: Diagnosis present

## 2022-06-30 DIAGNOSIS — E039 Hypothyroidism, unspecified: Secondary | ICD-10-CM | POA: Diagnosis present

## 2022-06-30 DIAGNOSIS — E861 Hypovolemia: Secondary | ICD-10-CM | POA: Diagnosis present

## 2022-06-30 DIAGNOSIS — I10 Essential (primary) hypertension: Secondary | ICD-10-CM | POA: Diagnosis present

## 2022-06-30 DIAGNOSIS — K831 Obstruction of bile duct: Secondary | ICD-10-CM | POA: Diagnosis present

## 2022-06-30 DIAGNOSIS — C7801 Secondary malignant neoplasm of right lung: Secondary | ICD-10-CM | POA: Diagnosis present

## 2022-06-30 DIAGNOSIS — Z79890 Hormone replacement therapy (postmenopausal): Secondary | ICD-10-CM | POA: Diagnosis not present

## 2022-06-30 DIAGNOSIS — Z8249 Family history of ischemic heart disease and other diseases of the circulatory system: Secondary | ICD-10-CM | POA: Diagnosis not present

## 2022-06-30 DIAGNOSIS — E860 Dehydration: Secondary | ICD-10-CM | POA: Diagnosis present

## 2022-06-30 DIAGNOSIS — D6959 Other secondary thrombocytopenia: Secondary | ICD-10-CM | POA: Diagnosis present

## 2022-06-30 DIAGNOSIS — Z515 Encounter for palliative care: Secondary | ICD-10-CM | POA: Diagnosis not present

## 2022-06-30 DIAGNOSIS — R627 Adult failure to thrive: Secondary | ICD-10-CM | POA: Diagnosis present

## 2022-06-30 DIAGNOSIS — Z91040 Latex allergy status: Secondary | ICD-10-CM | POA: Diagnosis not present

## 2022-06-30 DIAGNOSIS — Z6841 Body Mass Index (BMI) 40.0 and over, adult: Secondary | ICD-10-CM | POA: Diagnosis not present

## 2022-06-30 DIAGNOSIS — N179 Acute kidney failure, unspecified: Secondary | ICD-10-CM | POA: Diagnosis present

## 2022-06-30 DIAGNOSIS — C20 Malignant neoplasm of rectum: Secondary | ICD-10-CM | POA: Diagnosis present

## 2022-06-30 DIAGNOSIS — C7802 Secondary malignant neoplasm of left lung: Secondary | ICD-10-CM | POA: Diagnosis present

## 2022-06-30 DIAGNOSIS — R9431 Abnormal electrocardiogram [ECG] [EKG]: Secondary | ICD-10-CM | POA: Diagnosis present

## 2022-06-30 HISTORY — PX: IR EXCHANGE BILIARY DRAIN: IMG6046

## 2022-06-30 LAB — COMPREHENSIVE METABOLIC PANEL
ALT: 53 U/L — ABNORMAL HIGH (ref 0–44)
AST: 110 U/L — ABNORMAL HIGH (ref 15–41)
Albumin: 2.5 g/dL — ABNORMAL LOW (ref 3.5–5.0)
Alkaline Phosphatase: 135 U/L — ABNORMAL HIGH (ref 38–126)
Anion gap: 13 (ref 5–15)
BUN: 34 mg/dL — ABNORMAL HIGH (ref 8–23)
CO2: 19 mmol/L — ABNORMAL LOW (ref 22–32)
Calcium: 8.8 mg/dL — ABNORMAL LOW (ref 8.9–10.3)
Chloride: 100 mmol/L (ref 98–111)
Creatinine, Ser: 1.44 mg/dL — ABNORMAL HIGH (ref 0.44–1.00)
GFR, Estimated: 38 mL/min — ABNORMAL LOW (ref >60)
Glucose, Bld: 103 mg/dL — ABNORMAL HIGH (ref 70–99)
Potassium: 4.1 mmol/L (ref 3.5–5.1)
Sodium: 132 mmol/L — ABNORMAL LOW (ref 135–145)
Total Bilirubin: 22.1 mg/dL (ref 0.3–1.2)
Total Protein: 6.8 g/dL (ref 6.5–8.1)

## 2022-06-30 LAB — CBC WITH DIFFERENTIAL/PLATELET
Abs Immature Granulocytes: 0.04 10*3/uL (ref 0.00–0.07)
Basophils Absolute: 0 10*3/uL (ref 0.0–0.1)
Basophils Relative: 0 %
Eosinophils Absolute: 0 10*3/uL (ref 0.0–0.5)
Eosinophils Relative: 0 %
HCT: 37.7 % (ref 36.0–46.0)
Hemoglobin: 12.8 g/dL (ref 12.0–15.0)
Immature Granulocytes: 1 %
Lymphocytes Relative: 11 %
Lymphs Abs: 0.6 10*3/uL — ABNORMAL LOW (ref 0.7–4.0)
MCH: 34.5 pg — ABNORMAL HIGH (ref 26.0–34.0)
MCHC: 34 g/dL (ref 30.0–36.0)
MCV: 101.6 fL — ABNORMAL HIGH (ref 80.0–100.0)
Monocytes Absolute: 0.6 10*3/uL (ref 0.1–1.0)
Monocytes Relative: 13 %
Neutro Abs: 3.7 10*3/uL (ref 1.7–7.7)
Neutrophils Relative %: 75 %
Platelets: 129 10*3/uL — ABNORMAL LOW (ref 150–400)
RBC: 3.71 MIL/uL — ABNORMAL LOW (ref 3.87–5.11)
RDW: 14.1 % (ref 11.5–15.5)
WBC: 4.9 10*3/uL (ref 4.0–10.5)
nRBC: 0 % (ref 0.0–0.2)

## 2022-06-30 LAB — MAGNESIUM: Magnesium: 2.9 mg/dL — ABNORMAL HIGH (ref 1.7–2.4)

## 2022-06-30 LAB — PROTIME-INR
INR: 1.6 — ABNORMAL HIGH (ref 0.8–1.2)
Prothrombin Time: 19.4 seconds — ABNORMAL HIGH (ref 11.4–15.2)

## 2022-06-30 LAB — BRAIN NATRIURETIC PEPTIDE: B Natriuretic Peptide: 137.8 pg/mL — ABNORMAL HIGH (ref 0.0–100.0)

## 2022-06-30 MED ORDER — FENTANYL CITRATE (PF) 100 MCG/2ML IJ SOLN
INTRAMUSCULAR | Status: AC | PRN
  Administered 2022-06-30 (×2): 50 ug via INTRAVENOUS

## 2022-06-30 MED ORDER — SODIUM CHLORIDE 0.9 % IV SOLN: INTRAVENOUS | Status: DC

## 2022-06-30 MED ORDER — LIDOCAINE HCL 1 % IJ SOLN
INTRAMUSCULAR | Status: AC
  Filled 2022-06-30: qty 20

## 2022-06-30 MED ORDER — FENTANYL CITRATE PF 50 MCG/ML IJ SOSY
50.0000 ug | PREFILLED_SYRINGE | Freq: Once | INTRAMUSCULAR | Status: AC
  Administered 2022-06-30: 50 ug via INTRAVENOUS
  Filled 2022-06-30: qty 1

## 2022-06-30 MED ORDER — MIDAZOLAM HCL 2 MG/2ML IJ SOLN
INTRAMUSCULAR | Status: AC
  Filled 2022-06-30: qty 2

## 2022-06-30 MED ORDER — PROCHLORPERAZINE EDISYLATE 10 MG/2ML IJ SOLN
5.0000 mg | Freq: Four times a day (QID) | INTRAMUSCULAR | Status: DC | PRN
  Administered 2022-07-01: 5 mg via INTRAVENOUS
  Filled 2022-06-30: qty 2

## 2022-06-30 MED ORDER — SODIUM CHLORIDE 0.9% FLUSH
5.0000 mL | Freq: Three times a day (TID) | INTRAVENOUS | Status: DC
  Administered 2022-06-30 – 2022-07-01 (×2): 5 mL

## 2022-06-30 MED ORDER — POLYETHYLENE GLYCOL 3350 17 G PO PACK: 17.0000 g | PACK | Freq: Every day | ORAL | Status: DC | PRN

## 2022-06-30 MED ORDER — SODIUM CHLORIDE 0.9 % IV SOLN
2.0000 g | Freq: Once | INTRAVENOUS | Status: AC
  Administered 2022-06-30: 2 g via INTRAVENOUS
  Filled 2022-06-30: qty 20

## 2022-06-30 MED ORDER — SODIUM CHLORIDE 0.9% FLUSH: 10.0000 mL | Freq: Two times a day (BID) | INTRAVENOUS | Status: DC

## 2022-06-30 MED ORDER — CHLORHEXIDINE GLUCONATE CLOTH 2 % EX PADS
6.0000 | MEDICATED_PAD | Freq: Every day | CUTANEOUS | Status: DC
  Administered 2022-06-30 – 2022-07-01 (×2): 6 via TOPICAL

## 2022-06-30 MED ORDER — SODIUM CHLORIDE 0.9% FLUSH: 10.0000 mL | INTRAVENOUS | Status: DC | PRN

## 2022-06-30 MED ORDER — ALBUMIN HUMAN 25 % IV SOLN
25.0000 g | Freq: Four times a day (QID) | INTRAVENOUS | Status: AC
  Administered 2022-06-30 (×2): 25 g via INTRAVENOUS
  Filled 2022-06-30 (×2): qty 100

## 2022-06-30 MED ORDER — MIDAZOLAM HCL 2 MG/2ML IJ SOLN
INTRAMUSCULAR | Status: AC | PRN
  Administered 2022-06-30 (×2): 1 mg via INTRAVENOUS

## 2022-06-30 MED ORDER — OXYCODONE HCL 5 MG PO TABS
5.0000 mg | ORAL_TABLET | ORAL | Status: DC | PRN
  Administered 2022-06-30 (×2): 5 mg via ORAL
  Administered 2022-06-30: 10 mg via ORAL
  Administered 2022-07-01: 5 mg via ORAL
  Filled 2022-06-30 (×2): qty 1
  Filled 2022-06-30 (×2): qty 2

## 2022-06-30 MED ORDER — FENTANYL CITRATE (PF) 100 MCG/2ML IJ SOLN
INTRAMUSCULAR | Status: AC
  Filled 2022-06-30: qty 2

## 2022-06-30 MED ORDER — SODIUM CHLORIDE 0.9 % IV BOLUS (SEPSIS)
1000.0000 mL | Freq: Once | INTRAVENOUS | Status: AC
  Administered 2022-06-30: 1000 mL via INTRAVENOUS

## 2022-06-30 MED ORDER — LEVOTHYROXINE SODIUM 50 MCG PO TABS
50.0000 ug | ORAL_TABLET | Freq: Every day | ORAL | Status: DC
  Administered 2022-06-30 – 2022-07-01 (×2): 50 ug via ORAL
  Filled 2022-06-30 (×2): qty 1

## 2022-06-30 NOTE — Evaluation (Signed)
Occupational Therapy Evaluation Patient Details Name: Samantha Lutz MRN: [id number] DOB: [birth year] Today's Date: 06/30/2022   History of Present Illness 72 y.o. female who presented to [facility] ED from home after a mechanical fall with complaints of mid right-sided back pain and admitted 06/29/22 for acute kidney injury (AKI), suspect prerenal in the setting of dehydration from poor oral intake and intractable nausea and vomiting.  Past medical history significant for metastatic rectal cancer followed by Dr. Truett Perna, multifocal pulmonary metastasis with trace right pleural effusion, hypothyroidism, history of mild thrombocytopenia secondary to chemotherapy and radiation, gastroesophageal varices, history of obstructive jaundice secondary to central liver mass status post biliary stent placement and removal, esophageal and gastric varices status postplacement of right external biliary drain 04/21/2022, placement of new right external/internal biliary drain 04/26/2022, persistent hyperbilirubinemia secondary to a central obstructing liver lesion (despite right biliary drain in place, has not relieved hyperbilirubinemia)   Clinical Impression   The pt is presenting slightly below her baseline level of functioning for self-care management, given deconditioning, slight generalized weakness, and pain. She currently requires min guard assist for tasks, such as toileting at bathroom level, sit to stand, and grooming standing at the sink. She will benefit from further OT services to maximize her independence with ADLs and to facilitate her safe return home.       Recommendations for follow up therapy are one component of a multi-disciplinary discharge planning process, led by the attending physician.  Recommendations may be updated based on patient status, additional functional criteria and insurance authorization.   Assistance Recommended at Discharge Intermittent Supervision/Assistance  Patient can return home  with the following Assistance with cooking/housework;Assist for transportation;Help with stairs or ramp for entrance    Functional Status Assessment  Patient has had a recent decline in their functional status and demonstrates the ability to make significant improvements in function in a reasonable and predictable amount of time.  Equipment Recommendations  None recommended by OT    Recommendations for Other Services       Precautions / Restrictions Precautions Precautions: Fall Precaution Comments: 2 right biliary drains Restrictions Weight Bearing Restrictions: No      Mobility Bed Mobility Overal bed mobility: Needs Assistance Bed Mobility: Supine to Sit, Sit to Supine     Supine to sit: Supervision, HOB elevated Sit to supine: Supervision        Transfers Overall transfer level: Needs assistance Equipment used: Rolling walker (2 wheels) Transfers: Sit to/from Stand Sit to Stand: Min guard                  Balance     Sitting balance-Leahy Scale: Good         Standing balance comment: min guard with RW             ADL either performed or assessed with clinical judgement   ADL Overall ADL's : Needs assistance/impaired Eating/Feeding: Independent;Sitting   Grooming: Min guard;Standing Grooming Details (indicate cue type and reason): she performed hand washing standing at the sink.         Upper Body Dressing : Set up;Sitting Upper Body Dressing Details (indicate cue type and reason): simulated Lower Body Dressing: Min guard;Sit to/from stand   Toilet Transfer: Min guard;Ambulation;Rolling walker (2 wheels)   Toileting- Clothing Manipulation and Hygiene: Min guard;Sit to/from stand Toileting - Clothing Manipulation Details (indicate cue type and reason): She performed posterior and anterior hygiene standing at bathroom level.  Vision   Additional Comments: she correctly read the time depicted on the wall clock      Perception         Pertinent Vitals/Pain Pain Assessment Pain Assessment: 0-10 Pain Score: 6  Pain Location: back and between breasts Pain Intervention(s): Other (comment) (RN present in room and stated she will check on pain medication)     Hand Dominance Right   Extremity/Trunk Assessment Upper Extremity Assessment Upper Extremity Assessment: Overall WFL for tasks assessed   Lower Extremity Assessment Lower Extremity Assessment: Overall WFL for tasks assessed       Communication Communication Communication: No difficulties   Cognition Arousal/Alertness: Awake/alert Behavior During Therapy: WFL for tasks assessed/performed Overall Cognitive Status: Within Functional Limits for tasks assessed              General Comments: Oriented x4, able to follow commands without difficulty         Home Living Family/patient expects to be discharged to:: Private residence Living Arrangements: Alone   Type of Home: House Home Access: Level entry;Stairs to enter Entrance Stairs-Number of Steps: 4 Entrance Stairs-Rails: Left;Right Home Layout: One level     Bathroom Shower/Tub: Tub/shower unit         Home Equipment: Firefighter          Prior Functioning/Environment Prior Level of Function : Independent/Modified Independent             Mobility Comments:  (She did not use an AD for ambulation, however recently got a walker.) ADLs Comments:  (Pt was modified independent to independent with ADLs, cooking and cleaning.)        OT Problem List: Decreased strength;Decreased activity tolerance;Impaired balance (sitting and/or standing);Pain      OT Treatment/Interventions: Self-care/ADL training;Therapeutic exercise;Balance training;Therapeutic activities;Energy conservation;DME and/or AE instruction;Patient/family education    OT Goals(Current goals can be found in the care plan section) Acute Rehab OT Goals Patient Stated Goal: to be able to walk more  in her home, as well as do more in her kitchen OT Goal Formulation: With patient Time For Goal Achievement: 07/14/22 Potential to Achieve Goals: Good ADL Goals Pt Will Perform Grooming: with modified independence;standing Pt Will Perform Lower Body Dressing: with modified independence;sit to/from stand Pt Will Transfer to Toilet: with modified independence;ambulating Pt Will Perform Toileting - Clothing Manipulation and hygiene: with modified independence;sit to/from stand  OT Frequency: Min 1X/week       AM-PAC OT "6 Clicks" Daily Activity     Outcome Measure Help from another person eating meals?: None Help from another person taking care of personal grooming?: None Help from another person toileting, which includes using toliet, bedpan, or urinal?: A Little Help from another person bathing (including washing, rinsing, drying)?: A Little Help from another person to put on and taking off regular upper body clothing?: None Help from another person to put on and taking off regular lower body clothing?: A Little 6 Click Score: 21   End of Session Equipment Utilized During Treatment: Rolling walker (2 wheels);Gait belt Nurse Communication: Mobility status  Activity Tolerance: Patient tolerated treatment well Patient left: in bed;with call bell/phone within reach;with bed alarm set  OT Visit Diagnosis: Unsteadiness on feet (R26.81);History of falling (Z91.81)                Time: 1610-9604 OT Time Calculation (min): 23 min Charges:  OT General Charges $OT Visit: 1 Visit OT Evaluation $OT Eval Low Complexity: 1 Low OT Treatments $Self Care/Home Management :  8-22 mins    Reuben Likes, OTR/L 06/30/2022, 5:08 PM

## 2022-06-30 NOTE — Procedures (Signed)
Interventional Radiology Procedure Note  Procedure:   Image guided exchange of right int/ext biliary drain, new 60F, to gravity. Confirmed additional drain is not communicating with the left ducts to any meaningful degree. This was removed.  Confirmed that there is no [place] or fluoro window to the left liver for attempt at puncture.    Complications: None Recommendations:  - Maintain to gravity - Do not submerge - Routine line care  - OK to advance diet per primary order  Signed,  Yvone Neu. Loreta Ave, DO

## 2022-06-30 NOTE — Care Plan (Signed)
  PROGRESS NOTE    Samantha Lutz  ZOX:[id number] DOB: 01-21-1951 DOA: 06/29/2022 PCP: Deborah Chalk, FNP   Goals of care  -Discussed with patient and again with her daughter Delorise Jackson.  Patient does have a living well which we have asked to be presented to the hospital. -Will consult palliative care given patient's prognosis and limited clinical or surgical options per discussion with interventional radiology and oncology -Currently patient remains full code with full scope of care until patient and family indicate otherwise.  Azucena Fallen, DO [facility]  If 7PM-7AM, please contact night-coverage [email or web address]  06/30/2022, 2:58 PM

## 2022-06-30 NOTE — TOC Initial Note (Addendum)
Transition of Care [facility]) - Initial/Assessment Note    Patient Details  Name: Samantha Lutz MRN: [id number] Date of Birth: 04/05/1950  Transition of Care [facility]) CM/SW Contact:    Howell Rucks, RN Phone Number: 06/30/2022, 1:40 PM  Clinical Narrative:  Met with pt at bedside to introduce role of TOC/NCM and review for dc planning. PT recommendation for [facility] PT, pt agreeable, no preference. NCM will assist with [facility] PT, request sent out, await response. HH PT order and face to face request sent to MD.  [facility] will continue to follow.    - 2:09pm [facility], rep-Cheryl for [facility] PT.                 Expected Discharge Plan: Home w Home Health Services Barriers to Discharge: Continued Medical Work up   Patient Goals and CMS Choice Patient states their goals for this hospitalization and ongoing recovery are:: Home with [facility] PT CMS Medicare.gov Compare Post Acute Care list provided to:: Patient Choice offered to / list presented to : Patient      Expected Discharge Plan and Services   Discharge Planning Services: CM Consult                                          Prior Living Arrangements/Services   Lives with:: Self Patient language and need for interpreter reviewed:: Yes Do you feel safe going back to the place where you live?: Yes      Need for Family Participation in Patient Care: Yes (Comment) Care giver support system in place?: Yes (comment) Current home services: DME (walker) Criminal Activity/Legal Involvement Pertinent to Current Situation/Hospitalization: No - Comment as needed  Activities of Daily Living Home Assistive Devices/Equipment: None ADL Screening (condition at time of admission) Patient's cognitive ability adequate to safely complete daily activities?: Yes Is the patient deaf or have difficulty hearing?: No Does the patient have difficulty seeing, even when wearing glasses/contacts?: No Does the patient have difficulty concentrating, remembering, or  making decisions?: No Patient able to express need for assistance with ADLs?: Yes Does the patient have difficulty dressing or bathing?: No Independently performs ADLs?: Yes (appropriate for developmental age) Does the patient have difficulty walking or climbing stairs?: No Weakness of Legs: None Weakness of Arms/Hands: None  Permission Sought/Granted Permission sought to share information with : Case Manager Permission granted to share information with : Yes, Verbal Permission Granted  Share Information with NAME: Fannie Knee, RN           Emotional Assessment Appearance:: Appears stated age Attitude/Demeanor/Rapport: Gracious Affect (typically observed): Accepting Orientation: : Oriented to Self, Oriented to Place, Oriented to  Time, Oriented to Situation Alcohol / Substance Use: Not Applicable Psych Involvement: No (comment)  Admission diagnosis:  Hyperbilirubinemia [E80.6] Muscle strain [T14.8XXA] AKI (acute kidney injury) (HCC) [N17.9] Fall, initial encounter [W19.XXXA] Patient Active Problem List   Diagnosis Date Noted   AKI (acute kidney injury) (HCC) 06/30/2022   Disorder of bile duct stent 04/21/2022   Biliary obstruction 04/17/2022   Port-A-Cath in place 11/25/2019   Goals of care, counseling/discussion 10/02/2019   Lung metastasis 04/15/2019   Status post ileostomy takedown 07/10/2018 07/10/2018   Rectal cancer [id number] (1/23 LN) s/p neoadj chemoXRT, robotic LAR resection & diverting loop ileostomy 12/21/2017 09/04/2017   Hypothyroid 01/16/2013   Right knee pain    Elevated LFTs 01/03/2011   Hypertension 12/19/2010  Morbid obesity with body mass index of 40.0-49.9 (HCC)    PCP:  Deborah Chalk, FNP Pharmacy:   [facility] [address] [place] - [address] [place] [address] Phone: [phone] Fax: [phone]     Social Determinants of Health (SDOH) Social History: SDOH Screenings   Food  Insecurity: No Food Insecurity (06/30/2022)  Housing: Patient Declined (06/30/2022)  Transportation Needs: No Transportation Needs (06/30/2022)  Utilities: Not At Risk (06/30/2022)  Depression (PHQ2-9): Low Risk  (11/01/2018)  Financial Resource Strain: Low Risk  (11/01/2021)  Social Connections: Socially Isolated (11/01/2021)  Tobacco Use: Low Risk  (06/29/2022)   SDOH Interventions:     Readmission Risk Interventions    06/30/2022   11:34 AM  Readmission Risk Prevention Plan  Transportation Screening Complete  PCP or Specialist Appt within 5-7 Days Complete  Home Care Screening Complete  Medication Review (RN CM) Complete

## 2022-06-30 NOTE — Evaluation (Signed)
Physical Therapy Evaluation Patient Details Name: Samantha Lutz MRN: [id number] DOB: July 14, 1950 Today's Date: 06/30/2022  History of Present Illness  72 y.o. female who presented to [facility] ED from home after a mechanical fall with complaints of mid right-sided back pain and admitted 06/29/22 for acute kidney injury (AKI), suspect prerenal in the setting of dehydration from poor oral intake and intractable nausea and vomiting.  Past medical history significant for metastatic rectal cancer followed by Dr. Truett Perna, multifocal pulmonary metastasis with trace right pleural effusion, hypothyroidism, history of mild thrombocytopenia secondary to chemotherapy and radiation, gastroesophageal varices, history of obstructive jaundice secondary to central liver mass status post biliary stent placement and removal, esophageal and gastric varices status postplacement of right external biliary drain 04/21/2022, placement of new right external/internal biliary drain 04/26/2022, persistent hyperbilirubinemia secondary to a central obstructing liver lesion (despite right biliary drain in place, has not relieved hyperbilirubinemia)  Clinical Impression  Pt admitted with above diagnosis.  Pt currently with functional limitations due to the deficits listed below (see PT Problem List). Pt will benefit from acute skilled PT to increase their independence and safety with mobility to allow discharge.  Pt impressed by her ability to ambulate.  Pt reports she did have a fall at home and had back pain however no back pain present with mobilizing today.  Pt educated on safely using her RW as she had just received RW the day of her admission.  Pt feels safe to return home upon d/c and has her daughter checking in on her 2-3 times a day.        Recommendations for follow up therapy are one component of a multi-disciplinary discharge planning process, led by the attending physician.  Recommendations may be updated based on patient  status, additional functional criteria and insurance authorization.  Follow Up Recommendations       Assistance Recommended at Discharge PRN  Patient can return home with the following  Assistance with cooking/housework;Help with stairs or ramp for entrance    Equipment Recommendations None recommended by PT  Recommendations for Other Services       Functional Status Assessment Patient has had a recent decline in their functional status and demonstrates the ability to make significant improvements in function in a reasonable and predictable amount of time.     Precautions / Restrictions Precautions Precautions: Fall Precaution Comments: 2 right biliary drains      Mobility  Bed Mobility Overal bed mobility: Needs Assistance Bed Mobility: Supine to Sit     Supine to sit: Min guard, HOB elevated     General bed mobility comments: increased time and effort    Transfers Overall transfer level: Needs assistance Equipment used: Rolling walker (2 wheels) Transfers: Sit to/from Stand Sit to Stand: Min guard           General transfer comment: cues for hand placement    Ambulation/Gait Ambulation/Gait assistance: Min guard Gait Distance (Feet): 120 Feet Assistive device: Rolling walker (2 wheels) Gait Pattern/deviations: Step-through pattern, Decreased stride length Gait velocity: decr     General Gait Details: cues for RW positioning, posture; distance to tolerance  Stairs            Wheelchair Mobility    Modified Rankin (Stroke Patients Only)       Balance Overall balance assessment: History of Falls  Pertinent Vitals/Pain Pain Assessment Pain Assessment: No/denies pain    Home Living Family/patient expects to be discharged to:: Private residence Living Arrangements: Alone Available Help at Discharge: Family;Available PRN/intermittently Type of Home: House Home Access: Level  entry       Home Layout: One level Home Equipment: Agricultural consultant (2 wheels) Additional Comments: just started with RW the day of her fall and admission    Prior Function Prior Level of Function : Independent/Modified Independent             Mobility Comments: was ambulatory without assistive device, daughters requested pt start using RW and pt had just initiated RW day of admission ADLs Comments: no longer driving, daughter check in on pt to assist with any needs 2-3x daily     Hand Dominance        Extremity/Trunk Assessment        Lower Extremity Assessment Lower Extremity Assessment: Generalized weakness       Communication   Communication: No difficulties  Cognition Arousal/Alertness: Awake/alert Behavior During Therapy: WFL for tasks assessed/performed Overall Cognitive Status: Within Functional Limits for tasks assessed                                          General Comments      Exercises     Assessment/Plan    PT Assessment Patient needs continued PT services  PT Problem List Decreased strength;Decreased activity tolerance;Decreased mobility;Decreased balance;Decreased knowledge of use of DME       PT Treatment Interventions DME instruction;Gait training;Balance training;Therapeutic exercise;Functional mobility training;Therapeutic activities;Patient/family education    PT Goals (Current goals can be found in the Care Plan section)  Acute Rehab PT Goals PT Goal Formulation: With patient Time For Goal Achievement: 07/14/22 Potential to Achieve Goals: Good    Frequency Min 1X/week     Co-evaluation               AM-PAC PT "6 Clicks" Mobility  Outcome Measure Help needed turning from your back to your side while in a flat bed without using bedrails?: A Little Help needed moving from lying on your back to sitting on the side of a flat bed without using bedrails?: A Little Help needed moving to and from a bed to a  chair (including a wheelchair)?: A Little Help needed standing up from a chair using your arms (e.g., wheelchair or bedside chair)?: A Little Help needed to walk in hospital room?: A Little Help needed climbing 3-5 steps with a railing? : A Little 6 Click Score: 18    End of Session Equipment Utilized During Treatment: Gait belt Activity Tolerance: Patient tolerated treatment well Patient left: in chair;with call bell/phone within reach;with chair alarm set   PT Visit Diagnosis: Difficulty in walking, not elsewhere classified (R26.2)    Time: 1610-9604 PT Time Calculation (min) (ACUTE ONLY): 16 min   Charges:   PT Evaluation $PT Eval Low Complexity: 1 Low         Kati PT, DPT Physical Therapist Acute Rehabilitation Services Office: [phone]   Kati L Payson 06/30/2022, 1:10 PM

## 2022-06-30 NOTE — Progress Notes (Signed)
IP PROGRESS NOTE  Subjective:   Samantha Lutz is well-known to me with a history of metastatic rectal cancer.  She was admitted in March with hyperbilirubinemia.  The hyperbilirubinemia has persisted despite placement of multiple biliary drains.  She has been evaluated extensively by interventional radiology and there was no other good option for improving the hyperbilirubinemia.  We last saw her approximately 1 month ago.  We discussed salvage treatment with FOLFOX/bevacizumab if her performance status and bilirubin were improved.  She did not return for scheduled follow-up.  Samantha Lutz was admitted yesterday with failure to thrive.  She reports being very weak.  This has limited her ability to ambulate and care for herself in the home.  She reports not eating because she cannot prepare food.  She mitten pain at the mid back and low anterior chest.  She has intermittent nausea and vomiting.  She is having bowel movements. She has experienced falls at home.  She was able to ambulate with physical therapy today.   Objective: Vital signs in last 24 hours: Blood pressure (!) 94/42, pulse 71, temperature (!) 97.5 F (36.4 C), temperature source Oral, resp. rate 20, SpO2 100 %.  Intake/Output from previous day: 05/30 0701 - 05/31 0700 In: 1010.4 [P.O.:10; I.V.:0.4; IV Piggyback:1000] Out: -   Physical Exam:  HEENT: No thrush, scleral icterus Lungs: Clear bilaterally with decreased breath sounds at the right compared to the left chest, no respiratory distress Cardiac: Regular rate and rhythm Abdomen: No hepatosplenomegaly, nontender, biliary drains at the right anterolateral chest-blood-tinged gauze at the drain site Extremities: No leg edema Neurologic: Alert and oriented, the motor exam appears intact in the arms and legs bilaterally  Portacath/PICC-without erythema  Lab Results: Recent Labs    06/30/22 0050  WBC 4.9  HGB 12.8  HCT 37.7  PLT 129*    BMET Recent Labs     06/30/22 0050  NA 132*  K 4.1  CL 100  CO2 19*  GLUCOSE 103*  BUN 34*  CREATININE 1.44*  CALCIUM 8.8*    Lab Results  Component Value Date   CEA1 <1.00 06/01/2020   CEA <1.00 06/01/2020    Studies/Results: DG Thoracic Spine 2 View  Result Date: 06/30/2022 CLINICAL DATA:  Fall, upper back pain EXAM: THORACIC SPINE 2 VIEWS COMPARISON:  None Available. FINDINGS: There is no evidence of thoracic spine fracture. Alignment is normal. No other significant bone abnormalities are identified. Right upper quadrant biliary/gallbladder drains and right Port-A-Cath in place. IMPRESSION: No acute bony abnormality. Electronically Signed   By: Charlett Nose M.D.   On: 06/30/2022 01:47   DG Ribs Unilateral W/Chest Right  Result Date: 06/30/2022 CLINICAL DATA:  Fall EXAM: RIGHT RIBS AND CHEST - 3+ VIEW COMPARISON:  04/25/2022 FINDINGS: Right Port-A-Cath remains in place, unchanged. Biliary during and probable cholecystostomy tube project in the right upper quadrant. There is right base atelectasis. Mild elevation of the right hemidiaphragm. Stable nodules in the left upper lobe. Heart and mediastinal contours within normal limits. No effusion or pneumothorax. IMPRESSION: Mild elevation of the right hemidiaphragm with right base atelectasis. Left upper lobe nodules, unchanged. Electronically Signed   By: Charlett Nose M.D.   On: 06/30/2022 01:46    Medications: I have reviewed the patient's current medications.  Assessment/Plan:  Rectal cancer Mass at 7 cm from the anal verge on colonoscopy 08/09/2017, biopsy revealed invasive adenocarcinoma Staging CTs 08/17/2017-no evidence of metastatic disease, asymmetric thickening in the mid rectum MR pelvis 09/01/2017, T3N0 lesion beginning  at 6.3 cm from the anal sphincter Radiation/Xeloda initiated 09/17/2017, completed 10/25/2017 Low anterior resection/diverting ileostomy 12/21/2017,ypT3,ypN1a tumor.  Lymphovascular invasion present, intact mismatch repair protein  expression, treatment effect present (TRS 1).  Mismatch repair protein IHC normal; Foundation 1-KRAS/NRAS wild-type, microsatellite status and tumor mutational burden could not be determined. Cycle 1 adjuvant Xeloda beginning 01/21/2018 Cycle 2 adjuvant Xeloda beginning 02/11/2018 Xeloda discontinued after cycle 2 secondary to patient preference Ileostomy takedown 07/10/2018 CTs 09/07/2018- multiple live small pulmonary nodules concerning for metastatic disease CT chest 12/10/2018-multiple bilateral lung nodules, some have increased in size CT chest 04/08/2019-mild enlargement of bilateral lung nodules Status post SBRT multiple lung nodules 05/06/2019, 05/08/2019, 05/13/2019 CT chest 08/28/2019-improvement and resolution in majority of right-sided pulmonary nodules, a superior segment right lower lobe nodule has increased, no new right-sided nodules.  Progressive enlargement of left-sided pulmonary nodules PET scan 09/09/2019-hypermetabolic right lower lobe nodule, 2 hypermetabolic left upper lobe nodules with an additional 0.7 cm left upper lobe nodule below PET resolution, groundglass opacity in the mid to right lower lobe with associated hypermetabolism consistent with postradiation change, hypermetabolic central segment 4A liver lesion Cycle 1 FOLFOX 10/14/2019 Cycle  2FOLFOX 11/11/2019, 5-FU and oxaliplatin dose reduced secondary to neutropenia and thrombocytopenia following cycle one, G-CSF declined Cycle 3 FOLFOX 11/26/2019 Cycle 4 FOLFOX 12/11/2019, oxaliplatin held due to neutropenia and thrombocytopenia Cycle 5 FOLFOX 12/31/2019 Cycle 6 FOLFOX 01/14/2020 (oxaliplatin held, 5-fluorouracil dose reduced due to mucositis) CT chest 01/27/2020-improvement in left lung nodules, progressive airspace disease with traction bronchiectasis throughout the right lung, previously noted right lung nodules are obscured, mildly enlarged right paratracheal lymph nodes-not hypermetabolic on prior PET, likely reactive Cycle  7 FOLFOX 01/28/2020 Cycle 8 FOLFOX 02/11/2020 (oxaliplatin held due to neutropenia and thrombocytopenia) Cycle 9 FOLFOX 02/25/2020 (oxaliplatin held due to thrombocytopenia) Cycle 10 FOLFOX 03/11/2020 (oxaliplatin held secondary to neuropathy) Cycle 11 FOLFOX 03/25/2020 (oxaliplatin held due to neuropathy) CT chest 04/05/2020-no new or progressive findings.  Interval evolution of presumed postradiation scarring in the right perihilar lung with decrease in the more diffuse groundglass opacity seen previously.  No substantial change in left lung nodules. 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  No definite CT findings to suggest recurrent tumor.  Stable small left upper lobe pulmonary nodules.  No new or progressive findings.  Stable small right paratracheal lymph nodes.  Stable area of irregular low-attenuation in hepatic segment 4A, site of known prior hepatic metastatic lesion.  No new or progressive findings. Cycle 18 5-fluorouracil 07/06/2020 Cycle 19 5-fluorouracil 07/20/2020 Cycle 20 5-fluorouracil 08/03/2020 Cycle 21 5-fluorouracil 08/17/2020  Cycle 22 5-fluorouracil 08/31/2020 Cycle 23 5-fluorouracil 09/14/2020 CT chest 09/24/2020-increased number and size of pulmonary nodules bilaterally.  Findings in the right upper lobe are concerning for potential lymphangitic spread of tumor.  Right upper lobe findings could also reflect evolving postradiation change. Cycle 1 FOLFIRI 10/12/2020 Cycle 2 FOLFIRI 11/08/2020, irinotecan dose reduced due to neutropenia and thrombocytopenia following cycle 1 Cycle 3 FOLFIRI 12/01/2020 Cycle 4 FOLFIRI 12/22/2020 CT chest 01/11/2021-similar bilateral pulmonary nodules.  Left  upper lobe nodule has mildly decreased in size.  No new nodules. Cycle 5 FOLFIRI 01/12/2021 Cycle 6 FOLFIRI 02/03/2021 Cycle 7 FOLFIRI 03/01/2021 Cycle 8 FOLFIRI 03/22/2021 CT chest 04/08/2021-multiple small bilateral lung nodules not significantly changed.  No new nodules.  Unchanged enlarged pretracheal nodes.  Increase in fibrotic consolidation of the perihilar and inferior right lung. Cycle 9 FOLFIRI 04/12/2021 05/04/2021 treatment held due to fatigue Cycle 10 FOLFIRI 05/18/2021 06/07/2021-treatment held secondary to neutropenia Cycle 11 FOLFIRI 06/14/2021 Cycle 12 FOLFIRI 07/05/2021 Cycle 13 FOLFIRI 07/27/2021, irinotecan held 14 FOLFIRI 08/16/2021, irinotecan held CT chest 09/02/2021-enlargement of several lung nodules, no new nodules, stable postradiation changes in the right lower lobe and right middle lobe, gastroesophageal varices identified Cycle one 5-FU/Panitumumab 11/01/2021 Cycle two 5-FU/panitumumab 11/29/2021-Solu-Medrol and Pepcid prophylaxis added after she developed a cough during the panitumumab with cycle 1 Cycle 3 5-FU/panitumumab 12/12/2021 Cycle four 5-FU/panitumumab 12/27/2021 CT chest 01/25/2022-mild progression of metastatic disease to the lungs with increased size but stable number of metastatic lesions. Cycle 1 Lonsurf 02/27/2022 Cycle 2 Lonsurf 04/10/2022, placed on hold 04/13/2022 due to LFT abnormalities CT abdomen/pelvis 04/13/2022-persistent/chronic intrahepatic biliary dilatation most notable left hepatic lobe; vague area of low-attenuation near the porta hepatis, MRI recommended. MRI liver 04/14/2022-hypoenhancing 2.7 x 1.9 cm central liver mass in the region of the biliary hilum.  Associated high-grade biliary stricture at the biliary hilum affecting the central right and left intrahepatic bile ducts. CT abdomen/pelvis 05/25/2022 mild left intraparotid duct dilation with an internal/external biliary drain unchanged, multifocal pulmonary metastases unchanged, trace right pleural  effusion       2.   Hypothyroid   3.    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  Port-A-Cath placement 09/29/2019, interventional radiology   5.  Neutropenia and thrombocytopenia following cycle 1 FOLFOX-plan chemotherapy dose reductions, she declined G-CSF   6.  Mucositis following cycle 5 FOLFOX, 5-fluorouracil dose reduced with cycle 6 Mucositis following cycle 22 5-fluorouracil-Magic mouthwash added   7.  Right lung airspace disease/volume loss-likely toxicity from chest radiation, trial of prednisone 01/29/2020; dyspnea and cough improved 02/11/2020 Progressive cough 03/11/2020-prednisone resumed at a dose of 20 mg daily Cough and dyspnea improved 03/25/2020-prednisone taper to 15 mg daily Improved 04/06/2020-prednisone taper to 10 mg daily Stable 04/21/2020-prednisone taper to 5 mg daily   8.  Gastroesophageal varices    9.   Admission 04/17/2022 with obstructive jaundice secondary to a central liver mass ERCP 04/18/2022-biliary obstruction secondary to a central liver mass, status post stent placement MRI/MRCP-paraesophageal varices, or a stent extending from the duodenum to the confluence of the right and left hepatic duct stent does not extend into either the right or left duct, 4 x 1.7 cm central liver mass, suspicion for left portal vein thrombosis, mild pancreatic and peripancreatic edema, esophagus and gastric varices EGD removal of biliary stent 04/21/2022 Placement of right external biliary drain 04/21/2022 Placement of new right external/internal biliary drain 04/26/2022   10.  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  Post ERCP pancreatitis March 2024  12.  Admission 06/29/2022 with failure to thrive and persistent hyperbilirubinemia    Samantha Lutz has metastatic rectal cancer.  She has persistent bilirubinemia secondary to tumor at the hepatic hilum, likely secondary to metastatic rectal cancer  versus cholangiocarcinoma.  Multiple biliary drains have not relieve the hyperbilirubinemia.  She has been evaluated extensively by interventional radiology.  Interventionalist radiology plans to obtain a cholangiogram and exchange the right internal /external drain, and possibly place a left-sided drain today.  I discussed the poor prognosis with Samantha Lutz.  She has a poor performance status.  She will not be able to receive a trial of FOLFOX unless her performance status improves.  She  is a candidate for hospice care.  She agrees to home hospice care.  She would like to live at home as opposed to placement in a nursing or hospice facility.  She will discuss home care options with her family.  We discussed CPR and ACLS.  I recommended no CODE BLUE status.  She is unable to decide on a CODE STATUS today.  Recommendations:  Evaluate current biliary drain and options for improving the hyperbilirubinemia per interventional radiology [facility] referral for home hospice care, I will serve as the primary provider with hospice Continue PT and assess for her ability to return home Continue discussions regarding CODE STATUS Access Port-A-Cath Please call oncology as needed, I will check on her 07/03/2022 if she remains in the hospital Outpatient follow-up will be scheduled at the Cancer center     LOS: 0 days   Thornton Papas, MD   06/30/2022, 3:50 PM

## 2022-06-30 NOTE — Progress Notes (Signed)
PROGRESS NOTE    Samantha Lutz  ZOX:[id number] DOB: 23-Jan-1951 DOA: 06/29/2022 PCP: Deborah Chalk, FNP   Brief Narrative:  Samantha Lutz is a 72 y.o. female with medical history significant for metastatic rectal cancer followed by Dr. Truett Perna, multifocal pulmonary metastasis with trace right pleural effusion, hypothyroidism, history of mild thrombocytopenia secondary to chemotherapy and radiation, gastroesophageal varices, history of obstructive jaundice secondary to central liver mass status post biliary stent placement and removal, esophageal and gastric varices status postplacement of right external biliary drain 04/21/2022, placement of new right external/internal biliary drain 04/26/2022, persistent hyperbilirubinemia secondary to a central obstructing liver lesion (despite right biliary drain in place, has not relieved hyperbilirubinemia), who presented to ED from home after a mechanical fall/profound weakness.  Assessment & Plan:   Principal Problem:   Biliary obstruction Active Problems:   Rectal cancer ypT3ypN1a (1/23 LN) s/p neoadj chemoXRT, robotic LAR resection & diverting loop ileostomy 12/21/2017   Hypertension   Morbid obesity with body mass index of 40.0-49.9 (HCC)   Elevated LFTs   Hypothyroid   Goals of care, counseling/discussion   Disorder of bile duct stent   AKI (acute kidney injury) (HCC)  Goals of care  -Discussed with patient and again with her daughter Delorise Jackson.  Patient does have a living well which we have asked to be presented to the hospital. -Will consult palliative care given patient's prognosis and limited clinical or surgical options -Currently patient remains full code with full scope of care until patient and family indicate otherwise.  Acute on chronic biliary obstruction/jaundice Elevated liver panel -Secondary to metastatic rectal cancer with multiple procedures and evaluation for biliary drain placement, stent placement with continued  obstructive pathology and hyperbilirubinemia. -IR was consulted -potential biliary drain exchange versus upsizing in the next 24 hours -Discussed case with Dr. Truett Perna who indicates patient is not return to the clinic over the past few weeks for scheduled appointments.  Her prognosis remains poor.  AKI, suspect prerenal in the setting of dehydration from poor oral intake and intractable nausea and vomiting Renal function WNL at baseline Presented with creatinine of 1.44 GFR of 38. IVF until PO intake improves.   Hypovolemic hyponatremia Continue IV fluids, follow repeat labs   Non-anion gap metabolic acidosis secondary to renal insufficiency Likely multi factorial in the setting of uremia, dehydration, hyperbilirubinemia   Thrombocytopenia, chronic Secondary to above hepatopathy versus chemotherapy   QTc prolongation QTc 520 at intake, avoid QT prolonging medications as reasonable   Coagulopathy in the setting of above  INR 1.6 ; Platelet count 129 at intake Currently not on antiplatelets or anticoagulation No overt bleeding reported.  The patient denies hematochezia melena or hematemesis. *Clear if this will complicate interventional radiology procedure   Generalized fatigue, weakness, intractable nausea and vomiting Post mechanical fall, no fracture seen on imaging Supportive care IV antiemetics as needed PT OT assessment Fall precautions   Metastatic rectal cancer Management per hematology oncology, added to treatment teams. Pain control and bowel regimen   Hypothyroidism Levothyroxine  DVT prophylaxis: None in the setting of above Code Status: Full Family Communication: Discussed with patient and daughter over the phone  Status is: Inpatient  Dispo: The patient is from: Home              Anticipated d/c is to: To be determined              Anticipated d/c date is: 48 to 72 hours  Patient currently not medically stable for discharge  Consultants:   Interventional radiology  Procedures:  Potential biliary drain exchange/upsizing  Antimicrobials:  Ceftriaxone periprocedure  Subjective: No acute issues or events overnight denies vomiting diarrhea constipation headache fevers chills or chest pain.  Nausea ongoing but moderately controlled on medication  Objective: Vitals:   06/30/22 0300 06/30/22 0351 06/30/22 0811 06/30/22 1321  BP: (!) 115/56 (!) 134/59 (!) 104/48 (!) 94/42  Pulse: 75 81 79 71  Resp: 16 20 18 20   Temp: 98 F (36.7 C) 97.9 F (36.6 C) 97.9 F (36.6 C) (!) 97.5 F (36.4 C)  TempSrc: Oral Oral Oral Oral  SpO2: 100% 100% 100% 100%    Intake/Output Summary (Last 24 hours) at 06/30/2022 1430 Last data filed at 06/30/2022 1610 Gross per 24 hour  Intake 1010.38 ml  Output --  Net 1010.38 ml   There were no vitals filed for this visit.  Examination:  General:  Pleasantly resting in bed, No acute distress. HEENT: Sclera moderately icteric without injection. Neck:  Without mass or deformity.  Trachea is midline. Lungs:  Clear to auscultate bilaterally without rhonchi, wheeze, or rales. Heart:  Regular rate and rhythm.  Without murmurs, rubs, or gallops. Abdomen: Multiple drains noted as above, clean dry intact Extremities: Without cyanosis, clubbing, or obvious deformity. Vascular:  Dorsalis pedis and posterior tibial pulses palpable bilaterally. Skin: Jaundiced.   Data Reviewed: I have personally reviewed following labs and imaging studies  CBC: Recent Labs  Lab 06/30/22 0050  WBC 4.9  NEUTROABS 3.7  HGB 12.8  HCT 37.7  MCV 101.6*  PLT 129*   Basic Metabolic Panel: Recent Labs  Lab 06/30/22 0050  NA 132*  K 4.1  CL 100  CO2 19*  GLUCOSE 103*  BUN 34*  CREATININE 1.44*  CALCIUM 8.8*  MG 2.9*   GFR: CrCl cannot be calculated (Unknown ideal weight.). Liver Function Tests: Recent Labs  Lab 06/30/22 0050  AST 110*  ALT 53*  ALKPHOS 135*  BILITOT 22.1*  PROT 6.8  ALBUMIN 2.5*    Coagulation Profile: Recent Labs  Lab 06/30/22 0050  INR 1.6*   No results found for this or any previous visit (from the past 240 hour(s)).   Radiology Studies: DG Thoracic Spine 2 View  Result Date: 06/30/2022 CLINICAL DATA:  Fall, upper back pain EXAM: THORACIC SPINE 2 VIEWS COMPARISON:  None Available. FINDINGS: There is no evidence of thoracic spine fracture. Alignment is normal. No other significant bone abnormalities are identified. Right upper quadrant biliary/gallbladder drains and right Port-A-Cath in place. IMPRESSION: No acute bony abnormality. Electronically Signed   By: Charlett Nose M.D.   On: 06/30/2022 01:47   DG Ribs Unilateral W/Chest Right  Result Date: 06/30/2022 CLINICAL DATA:  Fall EXAM: RIGHT RIBS AND CHEST - 3+ VIEW COMPARISON:  04/25/2022 FINDINGS: Right Port-A-Cath remains in place, unchanged. Biliary during and probable cholecystostomy tube project in the right upper quadrant. There is right base atelectasis. Mild elevation of the right hemidiaphragm. Stable nodules in the left upper lobe. Heart and mediastinal contours within normal limits. No effusion or pneumothorax. IMPRESSION: Mild elevation of the right hemidiaphragm with right base atelectasis. Left upper lobe nodules, unchanged. Electronically Signed   By: Charlett Nose M.D.   On: 06/30/2022 01:46     Scheduled Meds:  levothyroxine  50 mcg Oral Q0600   Continuous Infusions:  sodium chloride 50 mL/hr at 06/30/22 0632   albumin human 25 g (06/30/22 9604)   cefTRIAXone (ROCEPHIN)  IV       LOS: 0 days    Time spent:    Azucena Fallen, DO [facility]  If 7PM-7AM, please contact night-coverage [email or web address]  06/30/2022, 2:30 PM

## 2022-06-30 NOTE — H&P (Addendum)
History and Physical  Samantha Lutz ZOX:[id number] DOB: [birth year] DOA: 06/29/2022  Referring physician: Dr. Bebe Shaggy, EDP  PCP: Deborah Chalk, FNP  Outpatient Specialists: Medical oncology, GI Patient coming from: Home.  Chief Complaint: Back pain from mechanical fall  HPI: Samantha Lutz is a 72 y.o. female with medical history significant for metastatic rectal cancer followed by Dr. Truett Perna, multifocal pulmonary metastasis with trace right pleural effusion, hypothyroidism, history of mild thrombocytopenia secondary to chemotherapy and radiation, gastroesophageal varices, history of obstructive jaundice secondary to central liver mass status post biliary stent placement and removal, esophageal and gastric varices status postplacement of right external biliary drain 04/21/2022, placement of new right external/internal biliary drain 04/26/2022, persistent hyperbilirubinemia secondary to a central obstructing liver lesion (despite right biliary drain in place, has not relieved hyperbilirubinemia), who presented to [facility] ED from home after a mechanical fall that occurred earlier today with complaints of mid right-sided back pain.  Associated with generalized weakness and intermittent nausea and vomiting.  The patient denies hematochezia melena or hematemesis.  In the ED, thoracic spine x-ray and unilateral x-ray of R chest ribs were nonacute.  Lab studies were remarkable for worsening elevated LFTs and T. bili 22.1 from 11.4, less than a month ago.  Lab studies were also notable for elevated creatinine 1.44 above her normal baseline.  The patient was due for a biliary drain exchange on 06/29/2022 which she missed due to presenting to the hospital.  EDP requested admission for further management of the patient's worsening hyperbilirubinemia and new AKI.  Admitted by [facility], hospitalist service, to telemetry unit as inpatient status.  ED Course: Temperature 97.9.  BP 124/59, pulse 81, respiratory 20,  saturation 100% on room air.  Serum sodium 132, serum bicarb 19, glucose 103, BUN 34, creatinine 1.44, magnesium 2.9, GFR 38, alkaline phosphatase 135, albumin 2.5, AST 110, ALT 53.  T. bili 22.1.  Platelet 129.  Review of Systems: Review of systems as noted in the HPI. All other systems reviewed and are negative.   Past Medical History:  Diagnosis Date   Anemia    remote hx.   Arthritis    Bi lat knee   Cancer (HCC) 07/2017   Rectal Cancer    Fatty liver    ultrasound December 2012   Hypothyroid 01/16/2013 dx   Irregular heartbeat    PER PATIENT REPORT; ONSET 20+YEARS AGO SAW CARDIOLOGY AT THE TIME C/O "ITS BEAT REALLY FAST FOR A MINUTE AND THEN STOPPED " DENIES ANY OTHER CARDIAC SX';  REPORTS CARDIO DID ECHO WHICH WAS NEGATIVE;  NOW COMES AND GOES ;    Lichen sclerosus    steroids as needed   Temporary low platelet count (HCC)    SEE LAST LABS IN EPIC   Past Surgical History:  Procedure Laterality Date   APPENDECTOMY  1967   BILIARY STENT PLACEMENT N/A 04/18/2022   Procedure: BILIARY STENT PLACEMENT;  Surgeon: Iva Boop, MD;  Location: [facility];  Service: Gastroenterology;  Laterality: N/A;   CESAREAN SECTION  1976 & 1983   COLONOSCOPY  08/09/2017   danis - polyps   ERCP N/A 04/18/2022   Procedure: ENDOSCOPIC RETROGRADE CHOLANGIOPANCREATOGRAPHY (ERCP);  Surgeon: Iva Boop, MD;  Location: [facility];  Service: Gastroenterology;  Laterality: N/A;   ESOPHAGOGASTRODUODENOSCOPY (EGD) WITH PROPOFOL N/A 04/21/2022   Procedure: ESOPHAGOGASTRODUODENOSCOPY (EGD) WITH PROPOFOL;  Surgeon: Iva Boop, MD;  Location: [facility];  Service: Gastroenterology;  Laterality: N/A;   ILEOSTOMY CLOSURE N/A 07/10/2018  Procedure: ILEOSTOMY LOOP TAKEDOWN WITH TAP BLOCK;  Surgeon: Karie Soda, MD;  Location: [facility];  Service: General;  Laterality: N/A;   IR BILIARY DRAIN PLACEMENT WITH CHOLANGIOGRAM  05/11/2022   IR CONVERT BILIARY DRAIN TO INT EXT BILIARY DRAIN  04/26/2022   IR  EXCHANGE BILIARY DRAIN  05/11/2022   IR IMAGING GUIDED PORT INSERTION  09/29/2019   IR PERCUTANEOUS TRANSHEPATIC CHOLANGIOGRAM  04/21/2022   OSTOMY N/A 12/21/2017   Procedure: DIVERTING LOOP OSTOMY;  Surgeon: Karie Soda, MD;  Location: [facility];  Service: General;  Laterality: N/A;   PROCTOSCOPY N/A 12/21/2017   Procedure: RIGID PROCTOSCOPY;  Surgeon: Karie Soda, MD;  Location: [facility];  Service: General;  Laterality: N/A;   RADIOLOGY WITH ANESTHESIA N/A 04/26/2022   Procedure: PERCUTANEOUS TRANSHEPATIC  CHOLANGIOGRAM;  Surgeon: Simonne Come, MD;  Location: [facility];  Service: Anesthesiology;  Laterality: N/A;   STENT REMOVAL  04/21/2022   Procedure: STENT REMOVAL;  Surgeon: Iva Boop, MD;  Location: [facility];  Service: Gastroenterology;;   XI ROBOTIC ASSISTED LOWER ANTERIOR RESECTION N/A 12/21/2017   Procedure: XI ROBOTIC ASSISTED LOWER ANTERIOR RESECTION;  Surgeon: Karie Soda, MD;  Location: [facility];  Service: General;  Laterality: N/A;    Social History:  reports that she has never smoked. She has never used smokeless tobacco. She reports that she does not drink alcohol and does not use drugs.   Allergies  Allergen Reactions   Epinephrine Palpitations   Losartan Hives   Latex Itching and Other (See Comments)    Skin redness   Vectibix [Panitumumab] Other (See Comments) and Cough    Non-productive coughing with chest tightness.   Patient had hypersensitivity reaction to Vectibix. See progress note from 11/01/2021 at 4:33 PM. Patient able to complete infusion.     Family History  Problem Relation Age of Onset   Diabetes Mother    Hypertension Mother    COPD Mother    Glaucoma Father 52   Stroke Maternal Grandmother 52   Hypertension Maternal Grandmother    Rheum arthritis Sister 41   Hypertension Daughter    Breast cancer Cousin    Colon cancer Neg Hx    Esophageal cancer Neg Hx    Liver cancer Neg Hx    Pancreatic cancer Neg Hx    Stomach cancer Neg Hx    Rectal  cancer Neg Hx       Prior to Admission medications   Medication Sig Start Date End Date Taking? Authorizing Provider  levothyroxine (SYNTHROID) 50 MCG tablet Take 1 tablet by mouth once daily 05/23/22  Yes Ladene Artist, MD  lidocaine-prilocaine (EMLA) cream Apply 1 Application topically as needed. Apply to port site 1-2 hours prior to use Patient taking differently: Apply 1 Application topically as needed (for port access- one to two hours prior to accessing). 12/12/21  Yes Rana Snare, NP  magnesium oxide (MAG-OX) 400 (240 Mg) MG tablet Take 1 tablet (400 mg total) by mouth 2 (two) times daily. 02/22/22  Yes Ladene Artist, MD  ondansetron (ZOFRAN) 8 MG tablet Take 1 tablet (8 mg total) by mouth every 8 (eight) hours as needed for nausea or vomiting. 06/28/22  Yes Ladene Artist, MD  oxyCODONE (OXY IR/ROXICODONE) 5 MG immediate release tablet Take 1-2 tablets (5-10 mg total) by mouth every 4 (four) hours as needed for severe pain. 06/01/22  Yes Ladene Artist, MD  potassium chloride (MICRO-K) 10 MEQ CR capsule Take 10 mEq by mouth in  the morning, at noon, and at bedtime.   Yes [provider]  predniSONE (DELTASONE) 5 MG tablet Take 1 tablet (5 mg total) by mouth daily with breakfast. 06/21/22  Yes Rana Snare, NP  prochlorperazine (COMPAZINE) 10 MG tablet Take 1 tablet (10 mg total) by mouth every 6 (six) hours as needed for nausea or vomiting. 06/21/22  Yes Rana Snare, NP  SYSTANE ULTRA PF 0.4-0.3 % SOLN Place 1 drop into both eyes 3 (three) times daily as needed (for dryness).   Yes [provider]  albuterol (PROVENTIL HFA) 108 (90 Base) MCG/ACT inhaler Inhale 2 puffs into the lungs every 6 (six) hours as needed for wheezing or shortness of breath. Patient not taking: Reported on 06/01/2022 08/03/20   Rana Snare, NP  magic mouthwash SOLN Take 5 mLs by mouth 4 (four) times daily as needed for mouth pain. 5 ml swish and spit 4 times a daily as needed for mouth  pain Patient not taking: Reported on 06/01/2022 09/14/20   Ladene Artist, MD  Nutritional Supplements (ENSURE ORIGINAL) LIQD Take 237 mLs by mouth See admin instructions. Drink 237 ml's (1 CHOCOLATE shake) by mouth once a day    [provider]  Spacer/Aero-Holding Chambers (AEROCHAMBER MV) inhaler Use as instructed Patient not taking: Reported on 06/01/2022 12/31/19   Ladene Artist, MD    Physical Exam: BP (!) 115/56 (BP Location: Left Arm)   Pulse 75   Temp 98 F (36.7 C) (Oral)   Resp 16   LMP  (LMP Unknown)   SpO2 100%   General: 72 y.o. year-old female frail-appearing in no acute distress.  Alert and oriented x3. Cardiovascular: Regular rate and rhythm with no rubs or gallops.  No thyromegaly or JVD noted.  No lower extremity edema. 2/4 pulses in all 4 extremities. Respiratory: Clear to auscultation with no wheezes or rales. Good inspiratory effort. Abdomen: Soft nontender nondistended with normal bowel sounds x4 quadrants. Muskuloskeletal: No cyanosis, clubbing or edema noted bilaterally Neuro: CN II-XII intact, strength, sensation, reflexes Skin: No ulcerative lesions noted or rashes.  Jaundice. Psychiatry: Judgement and insight appear normal. Mood is appropriate for condition and setting  Jaundice.        Labs on Admission:  Basic Metabolic Panel: Recent Labs  Lab 06/30/22 0050  NA 132*  K 4.1  CL 100  CO2 19*  GLUCOSE 103*  BUN 34*  CREATININE 1.44*  CALCIUM 8.8*  MG 2.9*   Liver Function Tests: Recent Labs  Lab 06/30/22 0050  AST 110*  ALT 53*  ALKPHOS 135*  BILITOT 22.1*  PROT 6.8  ALBUMIN 2.5*   No results for input(s): "LIPASE", "AMYLASE" in the last 168 hours. No results for input(s): "AMMONIA" in the last 168 hours. CBC: Recent Labs  Lab 06/30/22 0050  WBC 4.9  NEUTROABS 3.7  HGB 12.8  HCT 37.7  MCV 101.6*  PLT 129*   Cardiac Enzymes: No results for input(s): "CKTOTAL", "CKMB", "CKMBINDEX", "TROPONINI" in the last 168  hours.  BNP (last 3 results) Recent Labs    04/21/22 1835  BNP 164.9*    ProBNP (last 3 results) No results for input(s): "PROBNP" in the last 8760 hours.  CBG: No results for input(s): "GLUCAP" in the last 168 hours.  Radiological Exams on Admission: DG Thoracic Spine 2 View  Result Date: 06/30/2022 CLINICAL DATA:  Fall, upper back pain EXAM: THORACIC SPINE 2 VIEWS COMPARISON:  None Available. FINDINGS: There is no evidence of thoracic  spine fracture. Alignment is normal. No other significant bone abnormalities are identified. Right upper quadrant biliary/gallbladder drains and right Port-A-Cath in place. IMPRESSION: No acute bony abnormality. Electronically Signed   By: Charlett Nose M.D.   On: 06/30/2022 01:47   DG Ribs Unilateral W/Chest Right  Result Date: 06/30/2022 CLINICAL DATA:  Fall EXAM: RIGHT RIBS AND CHEST - 3+ VIEW COMPARISON:  04/25/2022 FINDINGS: Right Port-A-Cath remains in place, unchanged. Biliary during and probable cholecystostomy tube project in the right upper quadrant. There is right base atelectasis. Mild elevation of the right hemidiaphragm. Stable nodules in the left upper lobe. Heart and mediastinal contours within normal limits. No effusion or pneumothorax. IMPRESSION: Mild elevation of the right hemidiaphragm with right base atelectasis. Left upper lobe nodules, unchanged. Electronically Signed   By: Charlett Nose M.D.   On: 06/30/2022 01:46    EKG: I independently viewed the EKG done and my findings are as followed: Normal sinus rhythm rate of 76.  Nonspecific ST-T changes.  QTc 520.  Assessment/Plan Present on Admission:  AKI (acute kidney injury) (HCC)  Principal Problem:   AKI (acute kidney injury) (HCC)  AKI, suspect prerenal in the setting of dehydration from poor oral intake and intractable nausea and vomiting Renal function normal at baseline Presented with creatinine of 1.44 GFR of 38. Avoid nephrotoxic agents, dehydration and  hypotension Hypovolemic on exam IV albumin 25 g every 6 hours x 2 doses. IV fluid, NS at 50 cc/h x 2 days. Monitor urine output Repeat chemistry panel in the morning.  Hypovolemic hyponatremia Serum sodium 132 Continue NS at 50 cc/h x 2 days.  Anion gap metabolic acidosis secondary to renal insufficiency Serum bicarb 19, anion gap 13 Gentle IV fluid hydration Repeat chemistry panel in the morning.  Elevated liver chemistries, worsening Hyperbilirubinemia, worsening Suspect related to malignancy IR consulted to assess right-sided biliary drain. Avoid hepatotoxic agents.  Thrombocytopenia, chronic Possibly related to chemotherapy Platelet count 129 from 133.  QTc prolongation Admission twelve-lead EKG with QTc of 520. Avoid QTc prolonging agents Optimize magnesium and potassium levels Monitor on telemetry.  Coagulopathy INR 1.6 on 06/30/2022 Platelet count 129 Currently not on antiplatelets or anticoagulation No overt bleeding reported.  The patient denies hematochezia melena or hematemesis.  Generalized fatigue, weakness, intractable nausea and vomiting Post mechanical fall, no fracture seen on imaging Supportive care IV antiemetics as needed PT OT assessment Fall precautions  Metastatic rectal cancer Management per hematology oncology, added to treatment teams. Pain control and bowel regimen  Hypothyroidism Resume home levothyroxine    DVT prophylaxis: SCDs, pharmacological DVT prophylaxis was held off due to coagulopathy and risk of bleeding.  Code Status: Full code  Family Communication: Daughter at bedside.  Disposition Plan: Admitted to telemetry unit  Consults called: None, medical oncology, Dr. Truett Perna, added to treatment teams.  Admission status: Inpatient status.   Status is: Inpatient The patient requires at least 2 midnights for further evaluation and treatment of present condition.   Darlin Drop MD [facility] Pager  [phone]  If 7PM-7AM, please contact night-coverage [email or web address] Password [email or web address]  06/30/2022, 3:21 AM

## 2022-06-30 NOTE — Progress Notes (Signed)
Referring Physician(s): Hall,C  Supervising Physician: Gilmer Mor  Patient Status:  [facility] - In-pt  Chief Complaint: Metastatic colon cancer to liver/biliary obstruction   Subjective: Patient known to IR service from Port-A-Cath placement on 09/29/2019, right external biliary drain placement on 04/22/2022 with exchange for new internal/external right biliary drain on 04/26/2022, fluoroscopic guided exchange and upsizing of right sided internal/external biliary drain as well as placement of a right to left external biliary drain on 05/11/2022.  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  Prior imaging has revealed near complete resolution of previously noted right-sided intrahepatic biliary ductal dilatation with grossly unchanged left-sided intrahepatic biliary duct dilatation despite placement of a right to left external biliary drain catheter.  She has marked atrophy of the left hepatic lobe suggesting chronic obstructive process and placement of a left-sided biliary drain may not significantly impact bilirubin level.  She presented to the [facility], ED yesterday evening after mechanical fall at home with complaints of mild right-sided back pain, weakness, intermittent nausea /vomiting.  Thoracic spine plain x-ray was negative, chest x-ray revealed no effusion, pneumothorax or rib fracture. Current labs include normal WBC, hemoglobin 12.8, platelets 129K, creatinine 1.44, total bilirubin 22.1 up from 11.4  4 weeks ago, PT 19.4, INR 1.6.  Request now received from [facility] for biliary drain evaluation and exchange/upsizing.    Past Medical History:  Diagnosis Date   Anemia    remote hx.   Arthritis    Bi lat knee   Cancer (HCC) 07/2017   Rectal Cancer    Fatty liver    ultrasound  December 2012   Hypothyroid 01/16/2013 dx   Irregular heartbeat    PER PATIENT REPORT; ONSET 20+YEARS AGO SAW CARDIOLOGY AT THE TIME C/O "ITS BEAT REALLY FAST FOR A MINUTE AND THEN STOPPED " DENIES ANY OTHER CARDIAC SX';  REPORTS CARDIO DID ECHO WHICH WAS NEGATIVE;  NOW COMES AND GOES ;    Lichen sclerosus    steroids as needed   Temporary low platelet count (HCC)    SEE LAST LABS IN EPIC   Past Surgical History:  Procedure Laterality Date   APPENDECTOMY  1967   BILIARY STENT PLACEMENT N/A 04/18/2022   Procedure: BILIARY STENT PLACEMENT;  Surgeon: Iva Boop, MD;  Location: [facility];  Service: Gastroenterology;  Laterality: N/A;   CESAREAN SECTION  1976 & 1983   COLONOSCOPY  08/09/2017   danis - polyps   ERCP N/A 04/18/2022   Procedure: ENDOSCOPIC RETROGRADE CHOLANGIOPANCREATOGRAPHY (ERCP);  Surgeon: Iva Boop, MD;  Location: [facility];  Service: Gastroenterology;  Laterality: N/A;   ESOPHAGOGASTRODUODENOSCOPY (EGD) WITH PROPOFOL N/A 04/21/2022   Procedure: ESOPHAGOGASTRODUODENOSCOPY (EGD) WITH PROPOFOL;  Surgeon: Iva Boop, MD;  Location: [facility];  Service: Gastroenterology;  Laterality: N/A;   ILEOSTOMY CLOSURE N/A 07/10/2018   Procedure: ILEOSTOMY LOOP TAKEDOWN WITH TAP BLOCK;  Surgeon: Karie Soda, MD;  Location: [facility];  Service: General;  Laterality: N/A;   IR BILIARY DRAIN PLACEMENT WITH CHOLANGIOGRAM  05/11/2022   IR CONVERT BILIARY DRAIN TO INT EXT BILIARY DRAIN  04/26/2022   IR EXCHANGE BILIARY DRAIN  05/11/2022   IR IMAGING GUIDED PORT INSERTION  09/29/2019   IR PERCUTANEOUS TRANSHEPATIC CHOLANGIOGRAM  04/21/2022   OSTOMY N/A 12/21/2017   Procedure: DIVERTING LOOP OSTOMY;  Surgeon: Karie Soda, MD;  Location: [facility];  Service: General;  Laterality: N/A;   PROCTOSCOPY N/A 12/21/2017   Procedure: RIGID PROCTOSCOPY;  Surgeon: Karie Soda, MD;  Location: [facility];  Service: General;  Laterality: N/A;   RADIOLOGY WITH ANESTHESIA N/A 04/26/2022    Procedure: PERCUTANEOUS TRANSHEPATIC  CHOLANGIOGRAM;  Surgeon: Simonne Come, MD;  Location: [facility];  Service: Anesthesiology;  Laterality: N/A;   STENT REMOVAL  04/21/2022   Procedure: STENT REMOVAL;  Surgeon: Iva Boop, MD;  Location: [facility];  Service: Gastroenterology;;   XI ROBOTIC ASSISTED LOWER ANTERIOR RESECTION N/A 12/21/2017   Procedure: XI ROBOTIC ASSISTED LOWER ANTERIOR RESECTION;  Surgeon: Karie Soda, MD;  Location: [facility];  Service: General;  Laterality: N/A;      Allergies: Epinephrine, Losartan, Latex, and Vectibix [panitumumab]  Medications: Prior to Admission medications   Medication Sig Start Date End Date Taking? Authorizing Provider  levothyroxine (SYNTHROID) 50 MCG tablet Take 1 tablet by mouth once daily 05/23/22  Yes Ladene Artist, MD  lidocaine-prilocaine (EMLA) cream Apply 1 Application topically as needed. Apply to port site 1-2 hours prior to use Patient taking differently: Apply 1 Application topically as needed (for port access- one to two hours prior to accessing). 12/12/21  Yes Rana Snare, NP  magnesium oxide (MAG-OX) 400 (240 Mg) MG tablet Take 1 tablet (400 mg total) by mouth 2 (two) times daily. 02/22/22  Yes Ladene Artist, MD  ondansetron (ZOFRAN) 8 MG tablet Take 1 tablet (8 mg total) by mouth every 8 (eight) hours as needed for nausea or vomiting. 06/28/22  Yes Ladene Artist, MD  oxyCODONE (OXY IR/ROXICODONE) 5 MG immediate release tablet Take 1-2 tablets (5-10 mg total) by mouth every 4 (four) hours as needed for severe pain. 06/01/22  Yes Ladene Artist, MD  potassium chloride (MICRO-K) 10 MEQ CR capsule Take 10 mEq by mouth in the morning, at noon, and at bedtime.   Yes [provider]  predniSONE (DELTASONE) 5 MG tablet Take 1 tablet (5 mg total) by mouth daily with breakfast. 06/21/22  Yes Rana Snare, NP  prochlorperazine (COMPAZINE) 10 MG tablet Take 1 tablet (10 mg total) by mouth every 6 (six) hours as needed for  nausea or vomiting. 06/21/22  Yes Rana Snare, NP  SYSTANE ULTRA PF 0.4-0.3 % SOLN Place 1 drop into both eyes 3 (three) times daily as needed (for dryness).   Yes [provider]  albuterol (PROVENTIL HFA) 108 (90 Base) MCG/ACT inhaler Inhale 2 puffs into the lungs every 6 (six) hours as needed for wheezing or shortness of breath. Patient not taking: Reported on 06/01/2022 08/03/20   Rana Snare, NP  magic mouthwash SOLN Take 5 mLs by mouth 4 (four) times daily as needed for mouth pain. 5 ml swish and spit 4 times a daily as needed for mouth pain Patient not taking: Reported on 06/01/2022 09/14/20   Ladene Artist, MD  Nutritional Supplements (ENSURE ORIGINAL) LIQD Take 237 mLs by mouth See admin instructions. Drink 237 ml's (1 CHOCOLATE shake) by mouth once a day    [provider]  Spacer/Aero-Holding Chambers (AEROCHAMBER MV) inhaler Use as instructed Patient not taking: Reported on 06/01/2022 12/31/19   Ladene Artist, MD     Vital Signs: BP (!) 94/42 (BP Location: Left Arm)   Pulse 71   Temp (!) 97.5 F (36.4 C) (Oral)   Resp 20   LMP  (LMP Unknown)   SpO2 100%   Physical Exam;  awake, alert, jaundiced/scleral icterus; chest- sl dim BS bases; heart- RRR; abd- soft,+BS,NT; RUQ biliary drains intact, insertion site with some old blood on gauze; both drains flushed without difficulty, golden bile in bags  Imaging: DG Thoracic Spine 2 View  Result Date: 06/30/2022 CLINICAL DATA:  Fall, upper back pain EXAM: THORACIC SPINE 2 VIEWS COMPARISON:  None Available. FINDINGS: There is no evidence of thoracic spine fracture. Alignment is normal. No other significant bone abnormalities are identified. Right upper quadrant biliary/gallbladder drains and right Port-A-Cath in place. IMPRESSION: No acute bony abnormality. Electronically Signed   By: Charlett Nose M.D.   On: 06/30/2022 01:47   DG Ribs Unilateral W/Chest Right  Result Date: 06/30/2022 CLINICAL DATA:  Fall EXAM: RIGHT  RIBS AND CHEST - 3+ VIEW COMPARISON:  04/25/2022 FINDINGS: Right Port-A-Cath remains in place, unchanged. Biliary during and probable cholecystostomy tube project in the right upper quadrant. There is right base atelectasis. Mild elevation of the right hemidiaphragm. Stable nodules in the left upper lobe. Heart and mediastinal contours within normal limits. No effusion or pneumothorax. IMPRESSION: Mild elevation of the right hemidiaphragm with right base atelectasis. Left upper lobe nodules, unchanged. Electronically Signed   By: Charlett Nose M.D.   On: 06/30/2022 01:46    Labs:  CBC: Recent Labs    05/18/22 1237 05/25/22 1100 06/01/22 1059 06/30/22 0050  WBC 4.5 4.1 5.9 4.9  HGB 10.6* 11.3* 12.5 12.8  HCT 32.1* 34.1* 36.6 37.7  PLT 121* 106* 133* 129*    COAGS: Recent Labs    04/30/22 0500 05/11/22 1145 05/25/22 1100 06/30/22 0050  INR 1.4* 1.6* 1.5* 1.6*    BMP: Recent Labs    05/18/22 1237 05/25/22 1100 06/01/22 1059 06/30/22 0050  NA 140 136 137 132*  K 3.3* 2.7* 3.9 4.1  CL 104 101 103 100  CO2 26 24 23  19*  GLUCOSE 82 89 96 103*  BUN 14 13 19  34*  CALCIUM 8.2* 8.1* 8.6* 8.8*  CREATININE 0.72 0.82 0.94 1.44*  GFRNONAA >60 >60 >60 38*    LIVER FUNCTION TESTS: Recent Labs    05/18/22 1237 05/25/22 1100 06/01/22 1059 06/30/22 0050  BILITOT 11.5* 11.1* 11.4* 22.1*  AST 70* 78* 70* 110*  ALT 35 39 36 53*  ALKPHOS 136* 118 127* 135*  PROT 5.7* 6.1* 6.2* 6.8  ALBUMIN 2.8* 2.4* 3.1* 2.5*    Assessment and Plan: 72 yo female known to IR service from Port-A-Cath placement on 09/29/2019, right external biliary drain placement on 04/22/2022 with exchange for new internal/external right biliary drain on 04/26/2022, fluoroscopic guided exchange and upsizing of right sided internal/external biliary drain as well as placement of a right to left external biliary drain on 05/11/2022.  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  Prior imaging has revealed near complete resolution of previously noted right-sided intrahepatic biliary ductal dilatation with grossly unchanged left-sided intrahepatic biliary duct dilatation despite placement of a right to left external biliary drain catheter.  She has marked atrophy of the left hepatic lobe suggesting chronic obstructive process and placement of a left-sided biliary drain may not significantly impact bilirubin level.  She presented to the [facility], ED yesterday evening after mechanical fall at home with complaints of mild right-sided back pain, weakness, intermittent nausea /vomiting.  Thoracic spine plain x-ray was negative, chest x-ray  revealed no effusion, pneumothorax or rib fracture. Current labs include normal WBC, hemoglobin 12.8, platelets 129K, creatinine 1.44, total bilirubin 22.1 up from 11.4  4 weeks ago, PT 19.4, INR 1.6.  Request now received from [facility] for biliary drain evaluation and exchange/upsizing. She is afebrile, BP soft; case reviewed by Dr. Loreta Ave; plans d/w pt, consent signed; procedure tent scheduled for later today    Electronically Signed: D. Jeananne Rama, PA-C 06/30/2022, 1:50 PM   I spent a total of 20 minutes at the the patient's bedside AND on the patient's hospital floor or unit, greater than 50% of which was counseling/coordinating care for cholangiogram with biliary drain exchange/possible upsizing    Patient ID: Samantha Lutz, female   DOB: 16-May-1950, 72 y.o.   MRN: [id number]

## 2022-06-30 NOTE — TOC CM/SW Note (Addendum)
Transition of Care [facility]) - Inpatient Brief Assessment   Patient Details  Name: Samantha Lutz MRN: [id number] Date of Birth: [birth year]  Transition of Care [facility]) CM/SW Contact:    Howell Rucks, RN Phone Number: 06/30/2022, 11:36 AM   Clinical Narrative: Met with pt at bedside, introduced role of TOC/NCM, pt reports her Oncologist her is PCP, reports she lives alone has family support, has transport at discharge, home DME-walker.  TOC Brief Assessment completed. No TOC needs identified.    Transition of Care Asessment: Insurance and Status: Insurance coverage has been reviewed Patient has primary care physician: Yes (pt reports her Oncologist is her PCP) Home environment has been reviewed: Home, lives alone, has support from family/children Prior level of function:: Has a walker Prior/Current Home Services: No current home services Social Determinants of Health Reivew: SDOH reviewed no interventions necessary Readmission risk has been reviewed: Yes Transition of care needs: no transition of care needs at this time

## 2022-06-30 NOTE — ED Provider Notes (Signed)
[place] EMERGENCY DEPARTMENT AT [facility] Provider Note   CSN: [id number] Arrival date & time: 06/29/22  2345     History  Chief Complaint  Patient presents with   Back Pain    Samantha Lutz is a 72 y.o. female.   Back Pain Associated symptoms: no fever    Patient with history of metastatic rectal cancer, central obstructing liver lesion presents after a mechanical fall.  Patient reports this occurred on the morning of May 30.  She was going to the bathroom when she lost her balance and fell landing on her right side of her back.  No head injury or LOC.  No anterior chest or abdominal pain. Patient has biliary drain in place that is unchanged. She does report recent lack of appetite, nausea and vomiting over the past several days She does report generalized weakness.    Home Medications Prior to Admission medications   Medication Sig Start Date End Date Taking? Authorizing Provider  levothyroxine (SYNTHROID) 50 MCG tablet Take 1 tablet by mouth once daily 05/23/22  Yes Ladene Artist, MD  lidocaine-prilocaine (EMLA) cream Apply 1 Application topically as needed. Apply to port site 1-2 hours prior to use Patient taking differently: Apply 1 Application topically as needed (for port access- one to two hours prior to accessing). 12/12/21  Yes Rana Snare, NP  magnesium oxide (MAG-OX) 400 (240 Mg) MG tablet Take 1 tablet (400 mg total) by mouth 2 (two) times daily. 02/22/22  Yes Ladene Artist, MD  ondansetron (ZOFRAN) 8 MG tablet Take 1 tablet (8 mg total) by mouth every 8 (eight) hours as needed for nausea or vomiting. 06/28/22  Yes Ladene Artist, MD  oxyCODONE (OXY IR/ROXICODONE) 5 MG immediate release tablet Take 1-2 tablets (5-10 mg total) by mouth every 4 (four) hours as needed for severe pain. 06/01/22  Yes Ladene Artist, MD  potassium chloride (MICRO-K) 10 MEQ CR capsule Take 10 mEq by mouth in the morning, at noon, and at bedtime.   Yes [provider]  predniSONE (DELTASONE) 5 MG tablet Take 1 tablet (5 mg total) by mouth daily with breakfast. 06/21/22  Yes Rana Snare, NP  prochlorperazine (COMPAZINE) 10 MG tablet Take 1 tablet (10 mg total) by mouth every 6 (six) hours as needed for nausea or vomiting. 06/21/22  Yes Rana Snare, NP  SYSTANE ULTRA PF 0.4-0.3 % SOLN Place 1 drop into both eyes 3 (three) times daily as needed (for dryness).   Yes [provider]  albuterol (PROVENTIL HFA) 108 (90 Base) MCG/ACT inhaler Inhale 2 puffs into the lungs every 6 (six) hours as needed for wheezing or shortness of breath. Patient not taking: Reported on 06/01/2022 08/03/20   Rana Snare, NP  magic mouthwash SOLN Take 5 mLs by mouth 4 (four) times daily as needed for mouth pain. 5 ml swish and spit 4 times a daily as needed for mouth pain Patient not taking: Reported on 06/01/2022 09/14/20   Ladene Artist, MD  Nutritional Supplements (ENSURE ORIGINAL) LIQD Take 237 mLs by mouth See admin instructions. Drink 237 ml's (1 CHOCOLATE shake) by mouth once a day    [provider]  Spacer/Aero-Holding Chambers (AEROCHAMBER MV) inhaler Use as instructed Patient not taking: Reported on 06/01/2022 12/31/19   Ladene Artist, MD      Allergies    Epinephrine, Losartan, Latex, and Vectibix [panitumumab]    Review of Systems   Review of  Systems  Constitutional:  Negative for fever.  Gastrointestinal:  Positive for nausea and vomiting.  Musculoskeletal:  Positive for back pain.    Physical Exam Updated Vital Signs BP (!) 115/56 (BP Location: Left Arm)   Pulse 75   Temp 98 F (36.7 C) (Oral)   Resp 16   LMP  (LMP Unknown)   SpO2 100%  Physical Exam CONSTITUTIONAL: Chronically ill-appearing HEAD: Normocephalic/atraumatic ENMT: Mucous membranes moist NECK: supple no meningeal signs SPINE/BACK: No cervical spine tenderness.  Mild thoracic tenderness. Mild parathoracic tenderness noted on the right.  No lumbar  tenderness No bruising/crepitance/stepoffs noted to spine CV: S1/S2 noted, no murmurs/rubs/gallops noted LUNGS: Lungs are clear to auscultation bilaterally, no apparent distress ABDOMEN: soft, nontender NEURO: Pt is awake/alert/appropriate, moves all extremitiesx4.  No facial droop.  GCS 15 EXTREMITIES: pulses normal/equal, full ROM, no deformities SKIN: Jaundice noted PSYCH: no abnormalities of mood noted, alert and oriented to situation  ED Results / Procedures / Treatments   Labs (all labs ordered are listed, but only abnormal results are displayed) Labs Reviewed  CBC WITH DIFFERENTIAL/PLATELET - Abnormal; Notable for the following components:      Result Value   RBC 3.71 (*)    MCV 101.6 (*)    MCH 34.5 (*)    Platelets 129 (*)    Lymphs Abs 0.6 (*)    All other components within normal limits  COMPREHENSIVE METABOLIC PANEL - Abnormal; Notable for the following components:   Sodium 132 (*)    CO2 19 (*)    Glucose, Bld 103 (*)    BUN 34 (*)    Creatinine, Ser 1.44 (*)    Calcium 8.8 (*)    Albumin 2.5 (*)    AST 110 (*)    ALT 53 (*)    Alkaline Phosphatase 135 (*)    Total Bilirubin 22.1 (*)    GFR, Estimated 38 (*)    All other components within normal limits  PROTIME-INR - Abnormal; Notable for the following components:   Prothrombin Time 19.4 (*)    INR 1.6 (*)    All other components within normal limits  MAGNESIUM - Abnormal; Notable for the following components:   Magnesium 2.9 (*)    All other components within normal limits  BRAIN NATRIURETIC PEPTIDE    EKG EKG Interpretation  Date/Time:  Friday Jun 30 2022 00:58:01 EDT Ventricular Rate:  76 PR Interval:  153 QRS Duration: 58 QT Interval:  462 QTC Calculation: 520 R Axis:   17 Text Interpretation: Sinus rhythm Low voltage, precordial leads Abnormal R-wave progression, early transition Confirmed by Zadie Rhine ([id number]) on 06/30/2022 1:00:45 AM  Radiology DG Thoracic Spine 2 View  Result  Date: 06/30/2022 CLINICAL DATA:  Fall, upper back pain EXAM: THORACIC SPINE 2 VIEWS COMPARISON:  None Available. FINDINGS: There is no evidence of thoracic spine fracture. Alignment is normal. No other significant bone abnormalities are identified. Right upper quadrant biliary/gallbladder drains and right Port-A-Cath in place. IMPRESSION: No acute bony abnormality. Electronically Signed   By: Charlett Nose M.D.   On: 06/30/2022 01:47   DG Ribs Unilateral W/Chest Right  Result Date: 06/30/2022 CLINICAL DATA:  Fall EXAM: RIGHT RIBS AND CHEST - 3+ VIEW COMPARISON:  04/25/2022 FINDINGS: Right Port-A-Cath remains in place, unchanged. Biliary during and probable cholecystostomy tube project in the right upper quadrant. There is right base atelectasis. Mild elevation of the right hemidiaphragm. Stable nodules in the left upper lobe. Heart and mediastinal contours within normal limits. No  effusion or pneumothorax. IMPRESSION: Mild elevation of the right hemidiaphragm with right base atelectasis. Left upper lobe nodules, unchanged. Electronically Signed   By: Charlett Nose M.D.   On: 06/30/2022 01:46    Procedures Procedures    Medications Ordered in ED Medications  prochlorperazine (COMPAZINE) injection 5 mg (has no administration in time range)  polyethylene glycol (MIRALAX / GLYCOLAX) packet 17 g (has no administration in time range)  fentaNYL (SUBLIMAZE) injection 50 mcg (50 mcg Intravenous Given 06/30/22 0047)  sodium chloride 0.9 % bolus 1,000 mL (1,000 mLs Intravenous New Bag/Given 06/30/22 0146)    ED Course/ Medical Decision Making/ A&P Clinical Course as of 06/30/22 0317  Fri Jun 30, 2022  0251 Patient with complicated history presents after mechanical fall.  Fortunately there is no acute traumatic injuries noted on x-ray or on exam.  However she does report feeling increased weakness and nausea and vomiting.  Labs reveal acute kidney injury likely due to recent vomiting.  She is also noted to have  significant hyperbilirubinemia that is the highest that I can find.  She reports no new abdominal pain, and reports appropriate drainage from the biliary drain.  However it was scheduled to have biliary exchange done yesterday but she was unable to make the appointment.  This may be causing the hyperbilirubinemia. [DW]  4010 Due to her recent symptoms of generalized weakness and vomiting and abnormal labs, patient will be admitted.  She has been given IV fluids [DW]  0317 Discussed with Dr. Margo Aye for admission [DW]    Clinical Course User Index [DW] Zadie Rhine, MD                             Medical Decision Making Amount and/or Complexity of Data Reviewed Labs: ordered. Radiology: ordered. ECG/medicine tests: ordered.  Risk Prescription drug management. Decision regarding hospitalization.   This patient presents to the ED for concern of back pain after fall, this involves an extensive number of treatment options, and is a complaint that carries with it a high risk of complications and morbidity.  The differential diagnosis includes but is not limited to rib fracture, rib strain, muscle strain, thoracic spine fracture, pneumothorax  Comorbidities that complicate the patient evaluation: Patient's presentation is complicated by their history of rectal cancer with liver lesion  Social Determinants of Health: Patient's  mobility at baseline   increases the complexity of managing their presentation  Additional history obtained: Additional history obtained from family Records reviewed  oncology notes reviewed  Lab Tests: I Ordered, and personally interpreted labs.  The pertinent results include: Acute kidney injury, hyperbilirubinemia  Imaging Studies ordered: I ordered imaging studies including X-ray rib x-ray and thoracic spine   I independently visualized and interpreted imaging which showed no acute fractures I agree with the radiologist interpretation   Medicines ordered  and prescription drug management: I ordered medication including fentanyl for pain Reevaluation of the patient after these medicines showed that the patient    improved   Critical Interventions:   IV fluids  Consultations Obtained: I requested consultation with the admitting physician Dr. Margo Aye , and discussed  findings as well as pertinent plan - they recommend: Admit  Reevaluation: After the interventions noted above, I reevaluated the patient and found that they have :improved  Complexity of problems addressed: Patient's presentation is most consistent with  acute presentation with potential threat to life or bodily function  Disposition: After consideration of the  diagnostic results and the patient's response to treatment,  I feel that the patent would benefit from admission   .           Final Clinical Impression(s) / ED Diagnoses Final diagnoses:  AKI (acute kidney injury) (HCC)  Hyperbilirubinemia  Fall, initial encounter  Muscle strain    Rx / DC Orders ED Discharge Orders     None         Zadie Rhine, MD 06/30/22 [phone]

## 2022-07-01 DIAGNOSIS — K831 Obstruction of bile duct: Secondary | ICD-10-CM | POA: Diagnosis not present

## 2022-07-01 LAB — COMPREHENSIVE METABOLIC PANEL
ALT: 35 U/L (ref 0–44)
AST: 77 U/L — ABNORMAL HIGH (ref 15–41)
Albumin: 2.6 g/dL — ABNORMAL LOW (ref 3.5–5.0)
Alkaline Phosphatase: 91 U/L (ref 38–126)
Anion gap: 10 (ref 5–15)
BUN: 29 mg/dL — ABNORMAL HIGH (ref 8–23)
CO2: 19 mmol/L — ABNORMAL LOW (ref 22–32)
Calcium: 8.4 mg/dL — ABNORMAL LOW (ref 8.9–10.3)
Chloride: 106 mmol/L (ref 98–111)
Creatinine, Ser: 1.16 mg/dL — ABNORMAL HIGH (ref 0.44–1.00)
GFR, Estimated: 50 mL/min — ABNORMAL LOW (ref >60)
Glucose, Bld: 106 mg/dL — ABNORMAL HIGH (ref 70–99)
Potassium: 4 mmol/L (ref 3.5–5.1)
Sodium: 135 mmol/L (ref 135–145)
Total Bilirubin: 19.3 mg/dL (ref 0.3–1.2)
Total Protein: 5.5 g/dL — ABNORMAL LOW (ref 6.5–8.1)

## 2022-07-01 LAB — PROTIME-INR
INR: 2.3 — ABNORMAL HIGH (ref 0.8–1.2)
Prothrombin Time: 25.3 seconds — ABNORMAL HIGH (ref 11.4–15.2)

## 2022-07-01 LAB — CBC
HCT: 30.1 % — ABNORMAL LOW (ref 36.0–46.0)
Hemoglobin: 10.2 g/dL — ABNORMAL LOW (ref 12.0–15.0)
MCH: 34.9 pg — ABNORMAL HIGH (ref 26.0–34.0)
MCHC: 33.9 g/dL (ref 30.0–36.0)
MCV: 103.1 fL — ABNORMAL HIGH (ref 80.0–100.0)
Platelets: 61 10*3/uL — ABNORMAL LOW (ref 150–400)
RBC: 2.92 MIL/uL — ABNORMAL LOW (ref 3.87–5.11)
RDW: 14.3 % (ref 11.5–15.5)
WBC: 4 10*3/uL (ref 4.0–10.5)
nRBC: 0 % (ref 0.0–0.2)

## 2022-07-01 LAB — AMMONIA: Ammonia: 37 umol/L — ABNORMAL HIGH (ref 9–35)

## 2022-07-01 MED ORDER — HEPARIN SOD (PORK) LOCK FLUSH 100 UNIT/ML IV SOLN
500.0000 [IU] | INTRAVENOUS | Status: AC | PRN
  Administered 2022-07-01: 500 [IU]

## 2022-07-01 MED ORDER — POLYETHYLENE GLYCOL 3350 17 G PO PACK: 17.0000 g | PACK | Freq: Every day | ORAL | 0 refills | Status: DC | PRN

## 2022-07-01 NOTE — Discharge Summary (Signed)
Physician Discharge Summary  Samantha Lutz AVW:[id number] DOB: October 27, 1950 DOA: 06/29/2022  PCP: Deborah Chalk, FNP  Admit date: 06/29/2022 Discharge date: 07/01/2022  Admitted From: Home Disposition: Home  Recommendations for Outpatient Follow-up:  Follow up with home hospice as discussed  Discharge Condition: Guarded CODE STATUS: Full Diet recommendation: As tolerated  Brief/Interim Summary: Samantha Lutz is a 72 y.o. female with medical history significant for metastatic rectal cancer followed by Dr. Truett Perna, multifocal pulmonary metastasis with trace right pleural effusion, hypothyroidism, history of mild thrombocytopenia secondary to chemotherapy and radiation, gastroesophageal varices, history of obstructive jaundice secondary to central liver mass status post biliary stent placement and removal, esophageal and gastric varices status postplacement of right external biliary drain 04/21/2022, placement of new right external/internal biliary drain 04/26/2022, persistent hyperbilirubinemia secondary to a central obstructing liver lesion (despite right biliary drain in place, has not relieved hyperbilirubinemia), who presented to ED from home after a mechanical fall/profound weakness.   Patient admitted with markedly abnormal labs, limited intervention options but IR was able to upsize patient's drain and remove prior drain that was either poorly vs non-functioning.  Patient tolerated procedure well, bilirubin continues to downtrend, tolerating p.o. now.  Evaluated by PT OT with no further recommendations for placement other than ongoing therapy at home.  Patient had lengthy discussion with Dr. Truett Perna her oncologist and given limited options moving forward decision was made to transition to hospice at home.  At this time patient remains full code, close follow-up with outpatient hospice for ongoing care and management as discussed.  Discharge Diagnoses:  Principal Problem:   Biliary  obstruction Active Problems:   Rectal cancer [id number] (1/23 LN) s/p neoadj chemoXRT, robotic LAR resection & diverting loop ileostomy 12/21/2017   Hypertension   Morbid obesity with body mass index of 40.0-49.9 (HCC)   Elevated LFTs   Hypothyroid   Goals of care, counseling/discussion   Disorder of bile duct stent   AKI (acute kidney injury) (HCC)  Acute on chronic biliary obstruction/jaundice Elevated liver panel -Biliary drain upsized to 12Fr - additional drain removed as it was not functioning appropriately -Discussed case with Dr. Truett Perna who recommended home hospice given her prognosis remains poor with very limited options for treatment.   AKI, suspect prerenal in the setting of dehydration from poor oral intake and intractable nausea and vomiting Renal function WNL at baseline Presented with creatinine of 1.44 GFR of 38. P.o. intake improving appropriately   Hypovolemic hyponatremia Improving, continue increased p.o. intake now that nausea vomiting have resolved   Non-anion gap metabolic acidosis secondary to renal insufficiency Likely multi factorial in the setting of uremia, dehydration, hyperbilirubinemia   Thrombocytopenia, chronic Secondary to above hepatopathy versus chemotherapy   QTc prolongation QTc 520 at intake, avoid QT prolonging medications as reasonable   Coagulopathy in the setting of above  INR 1.6 ; Platelet count 129 at intake --> INR 2.3, platelet count 61 at discharge No overt bleeding reported.  The patient denies hematochezia melena or hematemesis.   Generalized fatigue, weakness, intractable nausea and vomiting Post mechanical fall, no fracture seen on imaging Supportive care Fall precautions   Metastatic rectal cancer Management per hematology oncology, added to treatment teams. Pain control and bowel regimen As discussed above per Dr. Truett Perna patient will transition to hospice given limited intervention options    Hypothyroidism Levothyroxine  Discharge Instructions   Allergies as of 07/01/2022       Reactions   Epinephrine Palpitations   Losartan Hives  Latex Itching, Other (See Comments)   Skin redness   Vectibix [panitumumab] Other (See Comments), Cough   Non-productive coughing with chest tightness.  Patient had hypersensitivity reaction to Vectibix. See progress note from 11/01/2021 at 4:33 PM. Patient able to complete infusion.         Medication List     STOP taking these medications    AeroChamber MV inhaler   albuterol 108 (90 Base) MCG/ACT inhaler Commonly known as: Proventil HFA   magic mouthwash Soln       TAKE these medications    Ensure Original Liqd Take 237 mLs by mouth See admin instructions. Drink 237 ml's (1 CHOCOLATE shake) by mouth once a day   levothyroxine 50 MCG tablet Commonly known as: SYNTHROID Take 1 tablet by mouth once daily   lidocaine-prilocaine cream Commonly known as: EMLA Apply 1 Application topically as needed. Apply to port site 1-2 hours prior to use What changed:  reasons to take this additional instructions   magnesium oxide 400 (240 Mg) MG tablet Commonly known as: MAG-OX Take 1 tablet (400 mg total) by mouth 2 (two) times daily.   ondansetron 8 MG tablet Commonly known as: ZOFRAN Take 1 tablet (8 mg total) by mouth every 8 (eight) hours as needed for nausea or vomiting.   oxyCODONE 5 MG immediate release tablet Commonly known as: Oxy IR/ROXICODONE Take 1-2 tablets (5-10 mg total) by mouth every 4 (four) hours as needed for severe pain.   polyethylene glycol 17 g packet Commonly known as: MIRALAX / GLYCOLAX Take 17 g by mouth daily as needed for mild constipation.   potassium chloride 10 MEQ CR capsule Commonly known as: MICRO-K Take 10 mEq by mouth in the morning, at noon, and at bedtime.   predniSONE 5 MG tablet Commonly known as: DELTASONE Take 1 tablet (5 mg total) by mouth daily with breakfast.    prochlorperazine 10 MG tablet Commonly known as: COMPAZINE Take 1 tablet (10 mg total) by mouth every 6 (six) hours as needed for nausea or vomiting.   Systane Ultra PF 0.4-0.3 % Soln Generic drug: Polyethyl Glyc-Propyl Glyc PF Place 1 drop into both eyes 3 (three) times daily as needed (for dryness).        Follow-up Information     [facility] Follow up.   Why: [facility]  Home health Physical Therapy Contact information: [address] [place] [address] [phone]                Allergies  Allergen Reactions   Epinephrine Palpitations   Losartan Hives   Latex Itching and Other (See Comments)    Skin redness   Vectibix [Panitumumab] Other (See Comments) and Cough    Non-productive coughing with chest tightness.   Patient had hypersensitivity reaction to Vectibix. See progress note from 11/01/2021 at 4:33 PM. Patient able to complete infusion.     Consultations: Oncology  Procedures/Studies: IR EXCHANGE BILIARY DRAIN  Result Date: 06/30/2022 INDICATION: 72 year old female with biliary obstruction. EXAM: IMAGE GUIDED BILIARY DRAIN EXCHANGE MEDICATIONS: None ANESTHESIA/SEDATION: Moderate (conscious) sedation was employed during this procedure. A total of Versed 2.0 mg and Fentanyl 25 mcg was administered intravenously by the radiology nurse. Total intra-service moderate Sedation Time: 20 minutes. The patient's level of consciousness and vital signs were monitored continuously by radiology nursing throughout the procedure under my direct supervision. FLUOROSCOPY: Radiation Exposure Index (as provided by the fluoroscopic device): 80 mGy Kerma COMPLICATIONS: None PROCEDURE: Informed written consent was obtained from  the patient after a thorough discussion of the procedural risks, benefits and alternatives. All questions were addressed. Maximal Sterile Barrier Technique was utilized including caps, mask, sterile gowns, sterile gloves, sterile drape, hand  hygiene and skin antiseptic. A timeout was performed prior to the initiation of the procedure. Patient was positioned supine on the image intensifier table. The patient was then prepped and draped in the usual sterile fashion. 1% lidocaine was used for local anesthesia at the site of drain insertion. Ultrasound survey was then performed of the left liver, identifying no adequate window for attempt at ultrasound-guided puncture of the left-sided ducts. Contrast was injected through the indwelling 12 [place] drain. The drain was ligated and wire was passed into the duodenum. An 8 French 35 cm sheath was placed. Contrast was injected through the left-sided drain identifying that there was no meaningful communication to the left-sided ducts. This drain was removed. We then attempted to navigate a Kumpe the catheter into the left-sided ducts through the 8 French sheath. Again, no retrograde pathway into the left-sided ducts was identified. Therefore, as there was no contrast opacifying the left-sided ducts, a fluoroscopic attempt at puncture of the left-sided ducts could not be performed. Routine exchange was then performed for a new 12 [place] biliary internal external drain. Once the drain was positioned contrast was injected and a final image was stored. Patient tolerated the procedure well and remained hemodynamically stable throughout. No complications were encountered and no significant blood loss. IMPRESSION: Status post routine exchange of a right-sided internal external biliary drain for a new 12 [place] drain. The images demonstrate no meaningful communication to the left-sided ducts, and so the secondary parallel externalized drain was removed. Signed, Yvone Neu. Miachel Roux, RPVI [facility] Radiology Electronically Signed   By: Gilmer Mor D.O.   On: 06/30/2022 18:40   DG Thoracic Spine 2 View  Result Date: 06/30/2022 CLINICAL DATA:  Fall, upper back  pain EXAM: THORACIC SPINE 2 VIEWS COMPARISON:  None Available. FINDINGS: There is no evidence of thoracic spine fracture. Alignment is normal. No other significant bone abnormalities are identified. Right upper quadrant biliary/gallbladder drains and right Port-A-Cath in place. IMPRESSION: No acute bony abnormality. Electronically Signed   By: Charlett Nose M.D.   On: 06/30/2022 01:47   DG Ribs Unilateral W/Chest Right  Result Date: 06/30/2022 CLINICAL DATA:  Fall EXAM: RIGHT RIBS AND CHEST - 3+ VIEW COMPARISON:  04/25/2022 FINDINGS: Right Port-A-Cath remains in place, unchanged. Biliary during and probable cholecystostomy tube project in the right upper quadrant. There is right base atelectasis. Mild elevation of the right hemidiaphragm. Stable nodules in the left upper lobe. Heart and mediastinal contours within normal limits. No effusion or pneumothorax. IMPRESSION: Mild elevation of the right hemidiaphragm with right base atelectasis. Left upper lobe nodules, unchanged. Electronically Signed   By: Charlett Nose M.D.   On: 06/30/2022 01:46     Subjective: No acute issues or events overnight tolerating p.o. well ambulating without much difficulty otherwise requesting discharge home which is certainly reasonable given improvement   Discharge Exam: Vitals:   06/30/22 2057 07/01/22 0415  BP: (!) 104/58 (!) 102/59  Pulse: 73 87  Resp: 16 16  Temp: 97.6 F (36.4 C) 97.9 F (36.6 C)  SpO2: 100% 100%   Vitals:   06/30/22 1849 06/30/22 2057 07/01/22 0415 07/01/22 0500  BP: (!) 101/56 (!) 104/58 (!) 102/59   Pulse: 82 73 87   Resp: 16 16 16  Temp: 97.7 F (36.5 C) 97.6 F (36.4 C) 97.9 F (36.6 C)   TempSrc: Oral Oral Oral   SpO2: 100% 100% 100%   Weight:    81.1 kg    General: Pt is alert, awake, not in acute distress Cardiovascular: RRR, S1/S2 +, no rubs, no gallops Respiratory: CTA bilaterally, no wheezing, no rhonchi Abdominal: Soft, NT, ND, bowel sounds + Extremities: no edema, no  cyanosis    The results of significant diagnostics from this hospitalization (including imaging, microbiology, ancillary and laboratory) are listed below for reference.     Microbiology: No results found for this or any previous visit (from the past 240 hour(s)).   Labs: BNP (last 3 results) Recent Labs    04/21/22 1835 06/30/22 0050  BNP 164.9* 137.8*   Basic Metabolic Panel: Recent Labs  Lab 06/30/22 0050 07/01/22 0424  NA 132* 135  K 4.1 4.0  CL 100 106  CO2 19* 19*  GLUCOSE 103* 106*  BUN 34* 29*  CREATININE 1.44* 1.16*  CALCIUM 8.8* 8.4*  MG 2.9*  --    Liver Function Tests: Recent Labs  Lab 06/30/22 0050 07/01/22 0424  AST 110* 77*  ALT 53* 35  ALKPHOS 135* 91  BILITOT 22.1* 19.3*  PROT 6.8 5.5*  ALBUMIN 2.5* 2.6*   No results for input(s): "LIPASE", "AMYLASE" in the last 168 hours. Recent Labs  Lab 07/01/22 0424  AMMONIA 37*   CBC: Recent Labs  Lab 06/30/22 0050 07/01/22 0424  WBC 4.9 4.0  NEUTROABS 3.7  --   HGB 12.8 10.2*  HCT 37.7 30.1*  MCV 101.6* 103.1*  PLT 129* 61*   Cardiac Enzymes: No results for input(s): "CKTOTAL", "CKMB", "CKMBINDEX", "TROPONINI" in the last 168 hours. BNP: Invalid input(s): "POCBNP" CBG: No results for input(s): "GLUCAP" in the last 168 hours. D-Dimer No results for input(s): "DDIMER" in the last 72 hours. Hgb A1c No results for input(s): "HGBA1C" in the last 72 hours. Lipid Profile No results for input(s): "CHOL", "HDL", "LDLCALC", "TRIG", "CHOLHDL", "LDLDIRECT" in the last 72 hours. Thyroid function studies No results for input(s): "TSH", "T4TOTAL", "T3FREE", "THYROIDAB" in the last 72 hours.  Invalid input(s): "FREET3" Anemia work up No results for input(s): "VITAMINB12", "FOLATE", "FERRITIN", "TIBC", "IRON", "RETICCTPCT" in the last 72 hours. Urinalysis    Component Value Date/Time   COLORURINE YELLOW 01/04/2015 1123   APPEARANCEUR CLEAR 01/04/2015 1123   LABSPEC 1.025 01/04/2015 1123    PHURINE 5.0 01/04/2015 1123   GLUCOSEU NEGATIVE 01/04/2015 1123   HGBUR NEGATIVE 01/04/2015 1123   BILIRUBINUR NEGATIVE 01/04/2015 1123   KETONESUR NEGATIVE 01/04/2015 1123   UROBILINOGEN 0.2 01/04/2015 1123   NITRITE NEGATIVE 01/04/2015 1123   LEUKOCYTESUR NEGATIVE 01/04/2015 1123   Sepsis Labs Recent Labs  Lab 06/30/22 0050 07/01/22 0424  WBC 4.9 4.0   Microbiology No results found for this or any previous visit (from the past 240 hour(s)).   Time coordinating discharge: Over 30 minutes  SIGNED:   Azucena Fallen, DO [facility] 07/01/2022, 11:48 AM Pager   If 7PM-7AM, please contact night-coverage [email or web address]

## 2022-07-01 NOTE — Plan of Care (Signed)
  Problem: Education: Goal: Knowledge of General Education information will improve Description: Including pain rating scale, medication(s)/side effects and non-pharmacologic comfort measures Outcome: Progressing   Problem: Clinical Measurements: Goal: Diagnostic test results will improve Outcome: Progressing   Problem: Safety: Goal: Ability to remain free from injury will improve Outcome: Progressing   

## 2022-07-01 NOTE — Progress Notes (Addendum)
Civil engineer, contracting [facility]) Hospital Liaison Note  Received request from Transitions of Care Manager,  Jaynie Collins, LCSW, for hospice services at home after discharge. Chart and patient information under review by [facility] physician.   Spoke with patient to initiate education related to hospice philosophy, services, and team approach to care. Patient verbalized understanding of information given. Per discussion, the plan is for patient to discharge home once cleared for DC.    DME needs discussed. Patient has the following equipment in the home: None per the patient.  Patient requests the following equipment for delivery:  Patient requests that the  AV nurse assess when she gets home for DME needs.    Please provide prescriptions at discharge as needed to ensure ongoing symptom management.    [facility] information and contact numbers given to family & above information shared with TOC.   Please call with any questions/concerns.    Thank you for the opportunity to participate in this patient's care.  [facility] Liaison [phone]

## 2022-07-03 ENCOUNTER — Telehealth: Payer: Self-pay | Admitting: *Deleted

## 2022-07-03 NOTE — Telephone Encounter (Signed)
Patient d/c from hospital to her home with Hospice. Attempted to reach her to offer her inpatient or video visit in next 2 weeks. Voice mail is full. Sent Mychart message with inquiry.

## 2022-07-04 ENCOUNTER — Other Ambulatory Visit (HOSPITAL_COMMUNITY): Payer: Self-pay

## 2022-07-05 ENCOUNTER — Inpatient Hospital Stay: Payer: Medicare Other

## 2022-07-05 ENCOUNTER — Inpatient Hospital Stay: Payer: Medicare Other | Admitting: Nurse Practitioner

## 2022-07-06 ENCOUNTER — Telehealth: Payer: Self-pay | Admitting: *Deleted

## 2022-07-06 NOTE — Telephone Encounter (Signed)
Left VM for daughter to ask Morrigan if she wants to see Dr. Truett Perna or Misty Stanley for OV or video visit. Have her call or send Mychart message w/her wishes.

## 2022-07-12 ENCOUNTER — Other Ambulatory Visit: Payer: Self-pay

## 2022-07-12 ENCOUNTER — Other Ambulatory Visit (HOSPITAL_COMMUNITY): Payer: Self-pay

## 2022-07-13 ENCOUNTER — Other Ambulatory Visit (HOSPITAL_COMMUNITY): Payer: Self-pay

## 2022-07-17 ENCOUNTER — Emergency Department (HOSPITAL_COMMUNITY): Payer: Medicare Other

## 2022-07-17 ENCOUNTER — Encounter (HOSPITAL_COMMUNITY): Payer: Self-pay | Admitting: Internal Medicine

## 2022-07-17 ENCOUNTER — Inpatient Hospital Stay (HOSPITAL_COMMUNITY)
Admission: EM | Admit: 2022-07-17 | Discharge: 2022-07-20 | DRG: 682 | Disposition: A | Discharge status: Hospice | Payer: Medicare Other | Attending: Internal Medicine | Admitting: Internal Medicine

## 2022-07-17 ENCOUNTER — Other Ambulatory Visit: Payer: Self-pay

## 2022-07-17 DIAGNOSIS — N170 Acute kidney failure with tubular necrosis: Principal | ICD-10-CM | POA: Diagnosis present

## 2022-07-17 DIAGNOSIS — Z825 Family history of asthma and other chronic lower respiratory diseases: Secondary | ICD-10-CM

## 2022-07-17 DIAGNOSIS — N179 Acute kidney failure, unspecified: Secondary | ICD-10-CM | POA: Diagnosis not present

## 2022-07-17 DIAGNOSIS — E860 Dehydration: Secondary | ICD-10-CM | POA: Diagnosis present

## 2022-07-17 DIAGNOSIS — C19 Malignant neoplasm of rectosigmoid junction: Secondary | ICD-10-CM | POA: Diagnosis present

## 2022-07-17 DIAGNOSIS — Z91040 Latex allergy status: Secondary | ICD-10-CM

## 2022-07-17 DIAGNOSIS — R4589 Other symptoms and signs involving emotional state: Secondary | ICD-10-CM

## 2022-07-17 DIAGNOSIS — E039 Hypothyroidism, unspecified: Secondary | ICD-10-CM | POA: Diagnosis present

## 2022-07-17 DIAGNOSIS — Z8249 Family history of ischemic heart disease and other diseases of the circulatory system: Secondary | ICD-10-CM

## 2022-07-17 DIAGNOSIS — Z823 Family history of stroke: Secondary | ICD-10-CM

## 2022-07-17 DIAGNOSIS — E440 Moderate protein-calorie malnutrition: Secondary | ICD-10-CM | POA: Insufficient documentation

## 2022-07-17 DIAGNOSIS — T50995A Adverse effect of other drugs, medicaments and biological substances, initial encounter: Secondary | ICD-10-CM | POA: Diagnosis present

## 2022-07-17 DIAGNOSIS — Z7189 Other specified counseling: Secondary | ICD-10-CM

## 2022-07-17 DIAGNOSIS — T451X5A Adverse effect of antineoplastic and immunosuppressive drugs, initial encounter: Secondary | ICD-10-CM | POA: Diagnosis present

## 2022-07-17 DIAGNOSIS — I8500 Esophageal varices without bleeding: Secondary | ICD-10-CM | POA: Diagnosis present

## 2022-07-17 DIAGNOSIS — E669 Obesity, unspecified: Secondary | ICD-10-CM | POA: Diagnosis present

## 2022-07-17 DIAGNOSIS — R627 Adult failure to thrive: Secondary | ICD-10-CM | POA: Diagnosis present

## 2022-07-17 DIAGNOSIS — D630 Anemia in neoplastic disease: Secondary | ICD-10-CM | POA: Diagnosis present

## 2022-07-17 DIAGNOSIS — K1230 Oral mucositis (ulcerative), unspecified: Secondary | ICD-10-CM | POA: Diagnosis present

## 2022-07-17 DIAGNOSIS — Z8261 Family history of arthritis: Secondary | ICD-10-CM

## 2022-07-17 DIAGNOSIS — C7800 Secondary malignant neoplasm of unspecified lung: Secondary | ICD-10-CM | POA: Diagnosis present

## 2022-07-17 DIAGNOSIS — Z711 Person with feared health complaint in whom no diagnosis is made: Secondary | ICD-10-CM

## 2022-07-17 DIAGNOSIS — I4891 Unspecified atrial fibrillation: Secondary | ICD-10-CM | POA: Diagnosis not present

## 2022-07-17 DIAGNOSIS — C799 Secondary malignant neoplasm of unspecified site: Secondary | ICD-10-CM | POA: Diagnosis present

## 2022-07-17 DIAGNOSIS — D6959 Other secondary thrombocytopenia: Secondary | ICD-10-CM | POA: Diagnosis present

## 2022-07-17 DIAGNOSIS — K831 Obstruction of bile duct: Secondary | ICD-10-CM | POA: Diagnosis present

## 2022-07-17 DIAGNOSIS — F419 Anxiety disorder, unspecified: Secondary | ICD-10-CM | POA: Diagnosis present

## 2022-07-17 DIAGNOSIS — K760 Fatty (change of) liver, not elsewhere classified: Secondary | ICD-10-CM | POA: Diagnosis present

## 2022-07-17 DIAGNOSIS — K766 Portal hypertension: Secondary | ICD-10-CM | POA: Diagnosis present

## 2022-07-17 DIAGNOSIS — Z83511 Family history of glaucoma: Secondary | ICD-10-CM

## 2022-07-17 DIAGNOSIS — J703 Chronic drug-induced interstitial lung disorders: Secondary | ICD-10-CM | POA: Diagnosis present

## 2022-07-17 DIAGNOSIS — R7989 Other specified abnormal findings of blood chemistry: Secondary | ICD-10-CM

## 2022-07-17 DIAGNOSIS — Z6832 Body mass index (BMI) 32.0-32.9, adult: Secondary | ICD-10-CM

## 2022-07-17 DIAGNOSIS — R160 Hepatomegaly, not elsewhere classified: Secondary | ICD-10-CM | POA: Diagnosis present

## 2022-07-17 DIAGNOSIS — Z515 Encounter for palliative care: Secondary | ICD-10-CM

## 2022-07-17 DIAGNOSIS — Z833 Family history of diabetes mellitus: Secondary | ICD-10-CM

## 2022-07-17 DIAGNOSIS — Z803 Family history of malignant neoplasm of breast: Secondary | ICD-10-CM

## 2022-07-17 DIAGNOSIS — R531 Weakness: Principal | ICD-10-CM

## 2022-07-17 DIAGNOSIS — M170 Bilateral primary osteoarthritis of knee: Secondary | ICD-10-CM | POA: Diagnosis present

## 2022-07-17 DIAGNOSIS — Z66 Do not resuscitate: Secondary | ICD-10-CM

## 2022-07-17 DIAGNOSIS — I864 Gastric varices: Secondary | ICD-10-CM | POA: Diagnosis present

## 2022-07-17 DIAGNOSIS — G893 Neoplasm related pain (acute) (chronic): Secondary | ICD-10-CM

## 2022-07-17 DIAGNOSIS — Z79890 Hormone replacement therapy (postmenopausal): Secondary | ICD-10-CM

## 2022-07-17 DIAGNOSIS — Z888 Allergy status to other drugs, medicaments and biological substances status: Secondary | ICD-10-CM

## 2022-07-17 DIAGNOSIS — Y842 Radiological procedure and radiotherapy as the cause of abnormal reaction of the patient, or of later complication, without mention of misadventure at the time of the procedure: Secondary | ICD-10-CM | POA: Diagnosis present

## 2022-07-17 DIAGNOSIS — Z79899 Other long term (current) drug therapy: Secondary | ICD-10-CM

## 2022-07-17 DIAGNOSIS — D709 Neutropenia, unspecified: Secondary | ICD-10-CM | POA: Diagnosis present

## 2022-07-17 DIAGNOSIS — L900 Lichen sclerosus et atrophicus: Secondary | ICD-10-CM | POA: Diagnosis present

## 2022-07-17 LAB — COMPREHENSIVE METABOLIC PANEL
ALT: 38 U/L (ref 0–44)
AST: 93 U/L — ABNORMAL HIGH (ref 15–41)
Albumin: 2.1 g/dL — ABNORMAL LOW (ref 3.5–5.0)
Alkaline Phosphatase: 124 U/L (ref 38–126)
Anion gap: 15 (ref 5–15)
BUN: 58 mg/dL — ABNORMAL HIGH (ref 8–23)
CO2: 12 mmol/L — ABNORMAL LOW (ref 22–32)
Calcium: 7.7 mg/dL — ABNORMAL LOW (ref 8.9–10.3)
Chloride: 106 mmol/L (ref 98–111)
Creatinine, Ser: 2.7 mg/dL — ABNORMAL HIGH (ref 0.44–1.00)
GFR, Estimated: 18 mL/min — ABNORMAL LOW (ref >60)
Glucose, Bld: 99 mg/dL (ref 70–99)
Potassium: 3.4 mmol/L — ABNORMAL LOW (ref 3.5–5.1)
Sodium: 133 mmol/L — ABNORMAL LOW (ref 135–145)
Total Bilirubin: 21.5 mg/dL (ref 0.3–1.2)
Total Protein: 5.4 g/dL — ABNORMAL LOW (ref 6.5–8.1)

## 2022-07-17 LAB — CBC WITH DIFFERENTIAL/PLATELET
Abs Immature Granulocytes: 0.05 10*3/uL (ref 0.00–0.07)
Basophils Absolute: 0 10*3/uL (ref 0.0–0.1)
Basophils Relative: 0 %
Eosinophils Absolute: 0 10*3/uL (ref 0.0–0.5)
Eosinophils Relative: 0 %
HCT: 28.9 % — ABNORMAL LOW (ref 36.0–46.0)
Hemoglobin: 10.2 g/dL — ABNORMAL LOW (ref 12.0–15.0)
Immature Granulocytes: 1 %
Lymphocytes Relative: 7 %
Lymphs Abs: 0.4 10*3/uL — ABNORMAL LOW (ref 0.7–4.0)
MCH: 34.7 pg — ABNORMAL HIGH (ref 26.0–34.0)
MCHC: 35.3 g/dL (ref 30.0–36.0)
MCV: 98.3 fL (ref 80.0–100.0)
Monocytes Absolute: 0.8 10*3/uL (ref 0.1–1.0)
Monocytes Relative: 14 %
Neutro Abs: 4.3 10*3/uL (ref 1.7–7.7)
Neutrophils Relative %: 78 %
Platelets: 113 10*3/uL — ABNORMAL LOW (ref 150–400)
RBC: 2.94 MIL/uL — ABNORMAL LOW (ref 3.87–5.11)
RDW: 15.9 % — ABNORMAL HIGH (ref 11.5–15.5)
WBC: 5.6 10*3/uL (ref 4.0–10.5)
nRBC: 0 % (ref 0.0–0.2)

## 2022-07-17 LAB — LIPASE, BLOOD: Lipase: 31 U/L (ref 11–51)

## 2022-07-17 LAB — LACTIC ACID, PLASMA
Lactic Acid, Venous: 1.6 mmol/L (ref 0.5–1.9)
Lactic Acid, Venous: 1.4 mmol/L (ref 0.5–1.9)

## 2022-07-17 LAB — LACTATE DEHYDROGENASE: LDH: 173 U/L (ref 98–192)

## 2022-07-17 MED ORDER — SENNOSIDES-DOCUSATE SODIUM 8.6-50 MG PO TABS: 1.0000 | ORAL_TABLET | Freq: Every evening | ORAL | Status: DC | PRN

## 2022-07-17 MED ORDER — ONDANSETRON HCL 4 MG PO TABS: 4.0000 mg | ORAL_TABLET | Freq: Four times a day (QID) | ORAL | Status: DC | PRN

## 2022-07-17 MED ORDER — OXYCODONE HCL 5 MG PO TABS
5.0000 mg | ORAL_TABLET | ORAL | Status: DC | PRN
  Administered 2022-07-18: 5 mg via ORAL
  Filled 2022-07-17: qty 1

## 2022-07-17 MED ORDER — ENSURE ENLIVE PO LIQD
237.0000 mL | Freq: Every day | ORAL | Status: DC
  Administered 2022-07-18: 237 mL via ORAL

## 2022-07-17 MED ORDER — SODIUM CHLORIDE 0.9 % IV BOLUS
500.0000 mL | Freq: Once | INTRAVENOUS | Status: AC
  Administered 2022-07-17: 500 mL via INTRAVENOUS

## 2022-07-17 MED ORDER — LACTATED RINGERS IV BOLUS
1000.0000 mL | Freq: Once | INTRAVENOUS | Status: AC
  Administered 2022-07-17: 1000 mL via INTRAVENOUS

## 2022-07-17 MED ORDER — LEVOTHYROXINE SODIUM 50 MCG PO TABS
50.0000 ug | ORAL_TABLET | Freq: Every day | ORAL | Status: DC
  Administered 2022-07-18 – 2022-07-19 (×2): 50 ug via ORAL
  Filled 2022-07-17 (×2): qty 1

## 2022-07-17 MED ORDER — ENOXAPARIN SODIUM 40 MG/0.4ML IJ SOSY: 40.0000 mg | PREFILLED_SYRINGE | INTRAMUSCULAR | Status: DC

## 2022-07-17 MED ORDER — ONDANSETRON HCL 4 MG/2ML IJ SOLN: 4.0000 mg | Freq: Four times a day (QID) | INTRAMUSCULAR | Status: DC | PRN

## 2022-07-17 MED ORDER — POLYETHYL GLYC-PROPYL GLYC PF 0.4-0.3 % OP SOLN: 1.0000 [drp] | Freq: Three times a day (TID) | OPHTHALMIC | Status: DC | PRN

## 2022-07-17 MED ORDER — ENOXAPARIN SODIUM 30 MG/0.3ML IJ SOSY
30.0000 mg | PREFILLED_SYRINGE | INTRAMUSCULAR | Status: DC
  Administered 2022-07-17 – 2022-07-18 (×2): 30 mg via SUBCUTANEOUS
  Filled 2022-07-17 (×2): qty 0.3

## 2022-07-17 MED ORDER — CHLORHEXIDINE GLUCONATE CLOTH 2 % EX PADS
6.0000 | MEDICATED_PAD | Freq: Every day | CUTANEOUS | Status: DC
  Administered 2022-07-17 – 2022-07-19 (×3): 6 via TOPICAL

## 2022-07-17 MED ORDER — ACETAMINOPHEN 325 MG PO TABS: 650.0000 mg | ORAL_TABLET | Freq: Four times a day (QID) | ORAL | Status: DC | PRN

## 2022-07-17 MED ORDER — SODIUM CHLORIDE 0.9% FLUSH
10.0000 mL | Freq: Two times a day (BID) | INTRAVENOUS | Status: DC
  Administered 2022-07-17 – 2022-07-19 (×4): 10 mL

## 2022-07-17 MED ORDER — ACETAMINOPHEN 650 MG RE SUPP: 650.0000 mg | Freq: Four times a day (QID) | RECTAL | Status: DC | PRN

## 2022-07-17 MED ORDER — SODIUM CHLORIDE 0.9 % IV SOLN: INTRAVENOUS | Status: DC

## 2022-07-17 MED ORDER — ALBUTEROL SULFATE (2.5 MG/3ML) 0.083% IN NEBU: 2.5000 mg | INHALATION_SOLUTION | RESPIRATORY_TRACT | Status: DC | PRN

## 2022-07-17 MED ORDER — SODIUM CHLORIDE 0.9% FLUSH: 10.0000 mL | INTRAVENOUS | Status: DC | PRN

## 2022-07-17 NOTE — ED Provider Notes (Signed)
[place] EMERGENCY DEPARTMENT AT [facility] Provider Note   CSN: [id number] Arrival date & time: 07/17/22  1057     History  Chief Complaint  Patient presents with   Weakness    Samantha Lutz is a 72 y.o. female.  HPI Patient with metastatic colon cancer presents with weakness, jaundice, ongoing abdominal and back pain. Last therapy was 2 months ago, she was hospitalized last month following similar worsening jaundice, notes that over the past 3 weeks she has become weaker, more jaundiced, and more pain.    Home Medications Prior to Admission medications   Medication Sig Start Date End Date Taking? Authorizing Provider  levothyroxine (SYNTHROID) 50 MCG tablet Take 1 tablet by mouth once daily 05/23/22   Ladene Artist, MD  lidocaine-prilocaine (EMLA) cream Apply 1 Application topically as needed. Apply to port site 1-2 hours prior to use Patient taking differently: Apply 1 Application topically as needed (for port access- one to two hours prior to accessing). 12/12/21   Rana Snare, NP  magnesium oxide (MAG-OX) 400 (240 Mg) MG tablet Take 1 tablet (400 mg total) by mouth 2 (two) times daily. 02/22/22   Ladene Artist, MD  Nutritional Supplements (ENSURE ORIGINAL) LIQD Take 237 mLs by mouth See admin instructions. Drink 237 ml's (1 CHOCOLATE shake) by mouth once a day    [provider]  ondansetron (ZOFRAN) 8 MG tablet Take 1 tablet (8 mg total) by mouth every 8 (eight) hours as needed for nausea or vomiting. 06/28/22   Ladene Artist, MD  oxyCODONE (OXY IR/ROXICODONE) 5 MG immediate release tablet Take 1-2 tablets (5-10 mg total) by mouth every 4 (four) hours as needed for severe pain. 06/01/22   Ladene Artist, MD  polyethylene glycol (MIRALAX / GLYCOLAX) 17 g packet Take 17 g by mouth daily as needed for mild constipation. 07/01/22   Azucena Fallen, MD  potassium chloride (MICRO-K) 10 MEQ CR capsule Take 10 mEq by mouth in the morning, at noon,  and at bedtime.    [provider]  predniSONE (DELTASONE) 5 MG tablet Take 1 tablet (5 mg total) by mouth daily with breakfast. 06/21/22   Rana Snare, NP  prochlorperazine (COMPAZINE) 10 MG tablet Take 1 tablet (10 mg total) by mouth every 6 (six) hours as needed for nausea or vomiting. 06/21/22   Rana Snare, NP  SYSTANE ULTRA PF 0.4-0.3 % SOLN Place 1 drop into both eyes 3 (three) times daily as needed (for dryness).    [provider]      Allergies    Epinephrine, Losartan, Latex, and Vectibix [panitumumab]    Review of Systems   Review of Systems  All other systems reviewed and are negative.   Physical Exam Updated Vital Signs BP (!) 114/58   Pulse 79   Temp 97.7 F (36.5 C) (Oral)   Resp 16   LMP  (LMP Unknown)   SpO2 100%  Physical Exam Vitals and nursing note reviewed.  Constitutional:      General: She is not in acute distress.    Appearance: She is well-developed. She is ill-appearing.  HENT:     Head: Normocephalic and atraumatic.  Eyes:     Conjunctiva/sclera: Conjunctivae normal.  Cardiovascular:     Rate and Rhythm: Normal rate and regular rhythm.  Pulmonary:     Effort: Pulmonary effort is normal. No respiratory distress.     Breath sounds: Normal breath sounds. No stridor.  Abdominal:     General: There is no distension.     Tenderness: There is abdominal tenderness.  Skin:    General: Skin is warm and dry.     Coloration: Skin is jaundiced.  Neurological:     Mental Status: She is alert and oriented to person, place, and time.     Cranial Nerves: No cranial nerve deficit.  Psychiatric:        Mood and Affect: Mood normal.     ED Results / Procedures / Treatments   Labs (all labs ordered are listed, but only abnormal results are displayed) Labs Reviewed  COMPREHENSIVE METABOLIC PANEL - Abnormal; Notable for the following components:      Result Value   Sodium 133 (*)    Potassium 3.4 (*)    CO2 12 (*)    BUN 58 (*)     Creatinine, Ser 2.70 (*)    Calcium 7.7 (*)    Total Protein 5.4 (*)    Albumin 2.1 (*)    AST 93 (*)    Total Bilirubin 21.5 (*)    GFR, Estimated 18 (*)    All other components within normal limits  CBC WITH DIFFERENTIAL/PLATELET - Abnormal; Notable for the following components:   RBC 2.94 (*)    Hemoglobin 10.2 (*)    HCT 28.9 (*)    MCH 34.7 (*)    RDW 15.9 (*)    Platelets 113 (*)    Lymphs Abs 0.4 (*)    All other components within normal limits  LACTIC ACID, PLASMA  LIPASE, BLOOD  LACTATE DEHYDROGENASE  LACTIC ACID, PLASMA  URINALYSIS, ROUTINE W REFLEX MICROSCOPIC    EKG None  Radiology CT ABDOMEN PELVIS WO CONTRAST  Result Date: 07/17/2022 CLINICAL DATA:  History of metastatic rectal cancer with biliary obstruction status post stent placement presenting with 2 weeks of generalized weakness. * Tracking Code: BO * EXAM: CT ABDOMEN AND PELVIS WITHOUT CONTRAST TECHNIQUE: Multidetector CT imaging of the abdomen and pelvis was performed following the standard protocol without IV contrast. RADIATION DOSE REDUCTION: This exam was performed according to the departmental dose-optimization program which includes automated exposure control, adjustment of the mA and/or kV according to patient size and/or use of iterative reconstruction technique. COMPARISON:  CT abdomen and pelvis dated 05/25/2022 FINDINGS: Lower chest: Partially imaged central venous catheter tip terminates in the right atrium. Similar right lower lobe mass. No pleural effusion or pneumothorax demonstrated. Partially imaged heart size is normal. Hepatobiliary: No focal hepatic lesions. Internal external biliary drain again seen with distal aspect of stent now extending to the third portion of the duodenum. No intra or extrahepatic biliary ductal dilation. Cholelithiasis. Pancreas: No focal lesions or main ductal dilation. Spleen: Normal in size without focal abnormality. Adrenals/Urinary Tract: No adrenal nodules. No  suspicious renal mass or hydronephrosis. Punctate nonobstructing bilateral renal stones. Trace nondependent gas within the urinary bladder. Stomach/Bowel: Normal appearance of the stomach. Postsurgical changes of the rectum with similar presacral soft tissue thickening/edema. Previously noted anal rectal wall thickening is suboptimally evaluated due to underdistention. Appendix is not discretely seen. Vascular/Lymphatic: Aortic atherosclerosis. Paraesophageal varices. No enlarged abdominal or pelvic lymph nodes. Reproductive: No adnexal masses. Other: No free fluid, fluid collection, or free air. Musculoskeletal: No acute or abnormal lytic or blastic osseous lesions. Multilevel degenerative changes of the partially imaged thoracic and lumbar spine. IMPRESSION: 1. Internal external biliary drain again seen with distal aspect of stent now extending to the third portion of the duodenum.  No intra or extrahepatic biliary ductal dilation. 2. Similar right lower lobe lung mass. 3. Postsurgical changes of the rectum with similar presacral soft tissue thickening/edema. Previously noted anorectal wall thickening is suboptimally evaluated due to underdistention. 4. Trace nondependent gas within the urinary bladder. Recommend correlation with history of recent instrumentation and urinalysis. 5.  Aortic Atherosclerosis (ICD10-I70.0). Electronically Signed   By: Agustin Cree M.D.   On: 07/17/2022 15:18    Procedures Procedures    Medications Ordered in ED Medications  lactated ringers bolus 1,000 mL (0 mLs Intravenous Stopped 07/17/22 1457)    ED Course/ Medical Decision Making/ A&P                             Medical Decision Making Ill-appearing elderly female with advanced colon cancer now presents with jaundice, abdominal pain, per chart review the patient had blockage of biliary drain previously and this may be contributing to today's presentation, though with worsening pain suspicion for progression of disease  versus other hepatobiliary dysfunction.  Patient had labs CT, chart review.  Amount and/or Complexity of Data Reviewed External Data Reviewed: notes. Labs: ordered. Decision-making details documented in ED Course. Radiology: ordered and independent interpretation performed. Decision-making details documented in ED Course.  Risk Prescription drug management. Decision regarding hospitalization. Diagnosis or treatment significantly limited by social determinants of health.  Brief discharge HPI from last hospitalization: Brief/Interim Summary: Samantha Lutz is a 72 y.o. female with medical history significant for metastatic rectal cancer followed by Dr. Truett Perna, multifocal pulmonary metastasis with trace right pleural effusion, hypothyroidism, history of mild thrombocytopenia secondary to chemotherapy and radiation, gastroesophageal varices, history of obstructive jaundice secondary to central liver mass status post biliary stent placement and removal, esophageal and gastric varices status postplacement of right external biliary drain 04/21/2022, placement of new right external/internal biliary drain 04/26/2022, persistent hyperbilirubinemia secondary to a central obstructing liver lesion (despite right biliary drain in place, has not relieved hyperbilirubinemia), who presented to ED from home after a mechanical fall/profound weakness.    Patient admitted with markedly abnormal labs, limited intervention options but IR was able to upsize patient's drain and remove prior drain that was either poorly vs non-functioning.  Patient tolerated procedure well, bilirubin continues to downtrend, tolerating p.o 3:27 PM Patient accompanied by her daughter. They are aware of all findings. Patient's CT without notable changes, and on repeat exam I reviewed the patient's biliary drain which continues to drain bilious fluid. This elderly female with metastatic colon cancer, chronic percutaneous biliary drain  presents with worsening jaundice, fatigue is found to have findings concerning for acute kidney injury as well as persistent hepatobiliary dysfunction.  Patient received fluid resuscitation in the ED, head CT, labs as above was admitted for further monitoring and management.        Final Clinical Impression(s) / ED Diagnoses Final diagnoses:  Weakness  AKI (acute kidney injury) (HCC)  Hyperbilirubinemia    Rx / DC Orders ED Discharge Orders     None         Gerhard Munch, MD 07/17/22 1528

## 2022-07-17 NOTE — ED Notes (Signed)
ED TO INPATIENT HANDOFF REPORT  ED Nurse Name and Phone #:    S Name/Age/Gender Samantha Lutz 72 y.o. female Room/Bed: WA25/WA25  Code Status   Code Status: Full Code  Home/SNF/Other Home Patient oriented to: self, place, time, and situation Is this baseline? Yes   Triage Complete: Triage complete  Chief Complaint AKI (acute kidney injury) (HCC) [N17.9]  Triage Note EMS reports from home, Pt c/o generalized weakness X 2 weeks. Daughters stated "hole in billiary bag." Hx of advanced colon CA.  BP 130/70 HR 82 RR 18 Sp02 99 RA CBG 112   Allergies Allergies  Allergen Reactions   Epinephrine Palpitations   Losartan Hives   Latex Itching and Other (See Comments)    Skin redness   Vectibix [Panitumumab] Other (See Comments) and Cough    Non-productive coughing with chest tightness.   Patient had hypersensitivity reaction to Vectibix. See progress note from 11/01/2021 at 4:33 PM. Patient able to complete infusion.     Level of Care/Admitting Diagnosis ED Disposition     ED Disposition  Admit   Condition  --   Comment  Hospital Area: [facility] [[id number]]  Level of Care: Med-Surg [16]  May place patient in observation at [facility] or [facility] if equivalent level of care is available:: Yes  Covid Evaluation: Asymptomatic - no recent exposure (last 10 days) testing not required  Diagnosis: AKI (acute kidney injury) [facility]) [[id number]]  Admitting Physician: Maryln Gottron [[id number]]  Attending Physician: Kirby Crigler, MIR [email or web address] [[id number]]          B Medical/Surgery History Past Medical History:  Diagnosis Date   Anemia    remote hx.   Arthritis    Bi lat knee   Cancer (HCC) 07/2017   Rectal Cancer    Fatty liver    ultrasound December 2012   Hypothyroid 01/16/2013 dx   Irregular heartbeat    PER PATIENT REPORT; ONSET 20+YEARS AGO SAW CARDIOLOGY AT THE TIME C/O "ITS BEAT REALLY FAST FOR A MINUTE AND THEN STOPPED " DENIES ANY OTHER  CARDIAC SX';  REPORTS CARDIO DID ECHO WHICH WAS NEGATIVE;  NOW COMES AND GOES ;    Lichen sclerosus    steroids as needed   Temporary low platelet count (HCC)    SEE LAST LABS IN EPIC   Past Surgical History:  Procedure Laterality Date   APPENDECTOMY  1967   BILIARY STENT PLACEMENT N/A 04/18/2022   Procedure: BILIARY STENT PLACEMENT;  Surgeon: Iva Boop, MD;  Location: [facility];  Service: Gastroenterology;  Laterality: N/A;   CESAREAN SECTION  1976 & 1983   COLONOSCOPY  08/09/2017   danis - polyps   ERCP N/A 04/18/2022   Procedure: ENDOSCOPIC RETROGRADE CHOLANGIOPANCREATOGRAPHY (ERCP);  Surgeon: Iva Boop, MD;  Location: [facility];  Service: Gastroenterology;  Laterality: N/A;   ESOPHAGOGASTRODUODENOSCOPY (EGD) WITH PROPOFOL N/A 04/21/2022   Procedure: ESOPHAGOGASTRODUODENOSCOPY (EGD) WITH PROPOFOL;  Surgeon: Iva Boop, MD;  Location: [facility];  Service: Gastroenterology;  Laterality: N/A;   ILEOSTOMY CLOSURE N/A 07/10/2018   Procedure: ILEOSTOMY LOOP TAKEDOWN WITH TAP BLOCK;  Surgeon: Karie Soda, MD;  Location: [facility];  Service: General;  Laterality: N/A;   IR BILIARY DRAIN PLACEMENT WITH CHOLANGIOGRAM  05/11/2022   IR CONVERT BILIARY DRAIN TO INT EXT BILIARY DRAIN  04/26/2022   IR EXCHANGE BILIARY DRAIN  05/11/2022   IR EXCHANGE BILIARY DRAIN  06/30/2022   IR IMAGING GUIDED PORT INSERTION  09/29/2019   IR  PERCUTANEOUS TRANSHEPATIC CHOLANGIOGRAM  04/21/2022   OSTOMY N/A 12/21/2017   Procedure: DIVERTING LOOP OSTOMY;  Surgeon: Karie Soda, MD;  Location: [facility];  Service: General;  Laterality: N/A;   PROCTOSCOPY N/A 12/21/2017   Procedure: RIGID PROCTOSCOPY;  Surgeon: Karie Soda, MD;  Location: [facility];  Service: General;  Laterality: N/A;   RADIOLOGY WITH ANESTHESIA N/A 04/26/2022   Procedure: PERCUTANEOUS TRANSHEPATIC  CHOLANGIOGRAM;  Surgeon: Simonne Come, MD;  Location: [facility];  Service: Anesthesiology;  Laterality: N/A;   STENT REMOVAL  04/21/2022    Procedure: STENT REMOVAL;  Surgeon: Iva Boop, MD;  Location: [facility];  Service: Gastroenterology;;   XI ROBOTIC ASSISTED LOWER ANTERIOR RESECTION N/A 12/21/2017   Procedure: XI ROBOTIC ASSISTED LOWER ANTERIOR RESECTION;  Surgeon: Karie Soda, MD;  Location: [facility];  Service: General;  Laterality: N/A;     A IV Location/Drains/Wounds Patient Lines/Drains/Airways Status     Active Line/Drains/Airways     Name Placement date Placement time Site Days   Implanted Port 09/29/19 Right Chest 09/29/19  1413  Chest  1022   Closed System Drain 1 Right;Lateral Abdomen Bulb (JP) 19 Fr. 12/21/17  1134  Abdomen  1669   Biliary Tube Cook slip-coat 12 Fr. RUQ 06/30/22  1822  RUQ  17   Colostomy RUQ 12/21/17  1231  RUQ  1669   Incision - 3 Ports Abdomen 1: Right;Lower 2: Right;Mid 3: Mid;Upper 12/21/17  0932  -- 1669            Intake/Output Last 24 hours No intake or output data in the 24 hours ending 07/17/22 1558  Labs/Imaging Results for orders placed or performed during the hospital encounter of 07/17/22 (from the past 48 hour(s))  Comprehensive metabolic panel     Status: Abnormal   Collection Time: 07/17/22 11:23 AM  Result Value Ref Range   Sodium 133 (L) 135 - 145 mmol/L   Potassium 3.4 (L) 3.5 - 5.1 mmol/L   Chloride 106 98 - 111 mmol/L   CO2 12 (L) 22 - 32 mmol/L   Glucose, Bld 99 70 - 99 mg/dL    Comment: Glucose reference range applies only to samples taken after fasting for at least 8 hours.   BUN 58 (H) 8 - 23 mg/dL   Creatinine, Ser 1.61 (H) 0.44 - 1.00 mg/dL    Comment: ICTERUS AT THIS LEVEL MAY AFFECT RESULT   Calcium 7.7 (L) 8.9 - 10.3 mg/dL   Total Protein 5.4 (L) 6.5 - 8.1 g/dL   Albumin 2.1 (L) 3.5 - 5.0 g/dL   AST 93 (H) 15 - 41 U/L   ALT 38 0 - 44 U/L   Alkaline Phosphatase 124 38 - 126 U/L   Total Bilirubin 21.5 (HH) 0.3 - 1.2 mg/dL    Comment: RESULT CONFIRMED BY MANUAL DILUTION CRITICAL RESULT CALLED TO, READ BACK BY AND VERIFIED WITH BLAIR,I.  RN AT 1305 07/17/22 MULLINS,T    GFR, Estimated 18 (L) >60 mL/min    Comment: (NOTE) Calculated using the CKD-EPI Creatinine Equation (2021)    Anion gap 15 5 - 15    Comment: Performed at [facility], [address], [place], [place] [address]  Lactic acid, plasma     Status: None   Collection Time: 07/17/22 11:23 AM  Result Value Ref Range   Lactic Acid, Venous 1.6 0.5 - 1.9 mmol/L    Comment: ICTERUS AT THIS LEVEL MAY AFFECT RESULT Performed at [facility], [address] [place], [place] [address]   Lipase, blood     Status: None   Collection Time: 07/17/22 11:23 AM  Result Value Ref Range   Lipase 31 11 - 51 U/L    Comment: Performed at [facility], [address] [place], [place] [address]  CBC with Differential     Status: Abnormal   Collection Time: 07/17/22 11:23 AM  Result Value Ref Range   WBC 5.6 4.0 - 10.5 K/uL   RBC 2.94 (L) 3.87 - 5.11 MIL/uL   Hemoglobin 10.2 (L) 12.0 - 15.0 g/dL   HCT 57.8 (L) 46.9 - 62.9 %   MCV 98.3 80.0 - 100.0 fL   MCH 34.7 (H) 26.0 - 34.0 pg   MCHC 35.3 30.0 - 36.0 g/dL   RDW 52.8 (H) 41.3 - 24.4 %   Platelets 113 (L) 150 - 400 K/uL   nRBC 0.0 0.0 - 0.2 %   Neutrophils Relative % 78 %   Neutro Abs 4.3 1.7 - 7.7 K/uL   Lymphocytes Relative 7 %   Lymphs Abs 0.4 (L) 0.7 - 4.0 K/uL   Monocytes Relative 14 %   Monocytes Absolute 0.8 0.1 - 1.0 K/uL   Eosinophils Relative 0 %   Eosinophils Absolute 0.0 0.0 - 0.5 K/uL   Basophils Relative 0 %   Basophils Absolute 0.0 0.0 - 0.1 K/uL   Immature Granulocytes 1 %   Abs Immature Granulocytes 0.05 0.00 - 0.07 K/uL    Comment: Performed at [facility], [address], [place], [place] [address]  Lactate dehydrogenase     Status: None   Collection Time: 07/17/22 11:23 AM  Result Value Ref Range   LDH 173 98 - 192 U/L    Comment: Performed at [facility], [address], [place], [place]  [address]   CT ABDOMEN PELVIS WO CONTRAST  Result Date: 07/17/2022 CLINICAL DATA:  History of metastatic rectal cancer with biliary obstruction status post stent placement presenting with 2 weeks of generalized weakness. * Tracking Code: BO * EXAM: CT ABDOMEN AND PELVIS WITHOUT CONTRAST TECHNIQUE: Multidetector CT imaging of the abdomen and pelvis was performed following the standard protocol without IV contrast. RADIATION DOSE REDUCTION: This exam was performed according to the departmental dose-optimization program which includes automated exposure control, adjustment of the mA and/or kV according to patient size and/or use of iterative reconstruction technique. COMPARISON:  CT abdomen and pelvis dated 05/25/2022 FINDINGS: Lower chest: Partially imaged central venous catheter tip terminates in the right atrium. Similar right lower lobe mass. No pleural effusion or pneumothorax demonstrated. Partially imaged heart size is normal. Hepatobiliary: No focal hepatic lesions. Internal external biliary drain again seen with distal aspect of stent now extending to the third portion of the duodenum. No intra or extrahepatic biliary ductal dilation. Cholelithiasis. Pancreas: No focal lesions or main ductal dilation. Spleen: Normal in size without focal abnormality. Adrenals/Urinary Tract: No adrenal nodules. No suspicious renal mass or hydronephrosis. Punctate nonobstructing bilateral renal stones. Trace nondependent gas within the urinary bladder. Stomach/Bowel: Normal appearance of the stomach. Postsurgical changes of the rectum with similar presacral soft tissue thickening/edema. Previously noted anal rectal wall thickening is suboptimally evaluated due to underdistention. Appendix is not discretely seen. Vascular/Lymphatic: Aortic atherosclerosis. Paraesophageal varices. No enlarged abdominal or pelvic lymph nodes. Reproductive: No adnexal masses. Other: No free fluid, fluid collection, or free air. Musculoskeletal: No  acute or abnormal lytic or blastic osseous lesions. Multilevel degenerative changes of the partially imaged  thoracic and lumbar spine. IMPRESSION: 1. Internal external biliary drain again seen with distal aspect of stent now extending to the third portion of the duodenum. No intra or extrahepatic biliary ductal dilation. 2. Similar right lower lobe lung mass. 3. Postsurgical changes of the rectum with similar presacral soft tissue thickening/edema. Previously noted anorectal wall thickening is suboptimally evaluated due to underdistention. 4. Trace nondependent gas within the urinary bladder. Recommend correlation with history of recent instrumentation and urinalysis. 5.  Aortic Atherosclerosis (ICD10-I70.0). Electronically Signed   By: Agustin Cree M.D.   On: 07/17/2022 15:18    Pending Labs Unresulted Labs (From admission, onward)     Start     Ordered   07/18/22 0500  CBC  Tomorrow morning,   R        07/17/22 1541   07/18/22 0500  Comprehensive metabolic panel  Tomorrow morning,   R        07/17/22 1541   07/17/22 1527  Urinalysis, Routine w reflex microscopic -Urine, Clean Catch  Once,   URGENT       Question:  Specimen Source  Answer:  Urine, Clean Catch   07/17/22 1526   07/17/22 1123  Lactic acid, plasma  Now then every 2 hours,   R (with STAT occurrences)      07/17/22 1123            Vitals/Pain Today's Vitals   07/17/22 1200 07/17/22 1230 07/17/22 1300 07/17/22 1511  BP: (!) 116/50 (!) 113/52 (!) 105/54 (!) 114/58  Pulse: 73 78 65 79  Resp:   16 16  Temp:    97.7 F (36.5 C)  TempSrc:    Oral  SpO2: 100% 100% 100% 100%  PainSc:        Isolation Precautions No active isolations  Medications Medications  oxyCODONE (Oxy IR/ROXICODONE) immediate release tablet 5-10 mg (has no administration in time range)  levothyroxine (SYNTHROID) tablet 50 mcg (has no administration in time range)  Ensure Original LIQD 237 mL (has no administration in time range)  Polyethyl  Glyc-Propyl Glyc PF 0.4-0.3 % SOLN 1 drop (has no administration in time range)  enoxaparin (LOVENOX) injection 40 mg (has no administration in time range)  0.9 %  sodium chloride infusion (has no administration in time range)  ondansetron (ZOFRAN) tablet 4 mg (has no administration in time range)    Or  ondansetron (ZOFRAN) injection 4 mg (has no administration in time range)  albuterol (PROVENTIL) (2.5 MG/3ML) 0.083% nebulizer solution 2.5 mg (has no administration in time range)  senna-docusate (Senokot-S) tablet 1 tablet (has no administration in time range)  acetaminophen (TYLENOL) tablet 650 mg (has no administration in time range)    Or  acetaminophen (TYLENOL) suppository 650 mg (has no administration in time range)  lactated ringers bolus 1,000 mL (0 mLs Intravenous Stopped 07/17/22 1457)    Mobility walks     Focused Assessments Weakness   R Recommendations: See Admitting Provider Note  Report given to:   Additional Notes: .

## 2022-07-17 NOTE — ED Triage Notes (Signed)
EMS reports from home, Pt c/o generalized weakness X 2 weeks. Daughters stated "hole in billiary bag." Hx of advanced colon CA.  BP 130/70 HR 82 RR 18 Sp02 99 RA CBG 112

## 2022-07-17 NOTE — ED Notes (Signed)
Patients port was accessed and blood work drawn from the same

## 2022-07-17 NOTE — H&P (Addendum)
History and Physical  Samantha Lutz WUJ:[id number] DOB: 07/03/1950 DOA: 07/17/2022  PCP: Deborah Chalk, FNP   Chief Complaint: leaking bag   HPI: Samantha Lutz is a 72 y.o. female with medical history significant for metastatic rectal cancer followed by Dr. Alcide Evener, multifocal pulmonary metastasis with trace right pleural effusion, hypothyroidism, esophageal varices, obstructive jaundice secondary to central liver mass with biliary stent placement being admitted to the hospital with dehydration and acute renal failure.  Initially had a right external biliary drain placed 04/21/2022, with replacement on 04/26/2022, with new right external/internal biliary drain which was upsized during her last hospital admission on 5/31.  Despite this, she has had persistent hyperbilirubinemia, and due to poor functional status she has not had any oncologic treatments of her metastatic rectal cancer for a couple of months.  Was last admitted on 5/31 and discharged home on 6/1, during that time she was hydrated and her anion and total bilirubin had improved slightly.  She was discharged home at that time without any increased level of care needs (taking care of at home by her family including her daughter), with plan to enroll in home hospice.  The patient tells me that her functional status since being at home has been stable.  She has not completed enrollment in home hospice because she does not feel ready.  She denies any fevers or chills, she intermittently has nausea and vomiting, she does not suffer from much chronic pain, she does however have a very poor appetite and says that she eats or drinks only intermittently.  She says that starting early yesterday, she and her daughter have noticed that the bag connected to her biliary drain is leaking.  This is why she presented to the hospital.  ED Course: On ER evaluation, the patient has been hemodynamically stable with BP on the low end.  Lab work was  performed, which shows acute renal failure, rising bilirubin.  CT of the abdomen and pelvis was performed this afternoon as well, without any acute findings, and detailed below.  She was given a liter bolus of LR, and hospitalist was contacted for admission.  Review of Systems: Please see HPI for pertinent positives and negatives. A complete 10 system review of systems are otherwise negative.  Past Medical History:  Diagnosis Date   Anemia    remote hx.   Arthritis    Bi lat knee   Cancer (HCC) 07/2017   Rectal Cancer    Fatty liver    ultrasound December 2012   Hypothyroid 01/16/2013 dx   Irregular heartbeat    PER PATIENT REPORT; ONSET 20+YEARS AGO SAW CARDIOLOGY AT THE TIME C/O "ITS BEAT REALLY FAST FOR A MINUTE AND THEN STOPPED " DENIES ANY OTHER CARDIAC SX';  REPORTS CARDIO DID ECHO WHICH WAS NEGATIVE;  NOW COMES AND GOES ;    Lichen sclerosus    steroids as needed   Temporary low platelet count (HCC)    SEE LAST LABS IN EPIC   Past Surgical History:  Procedure Laterality Date   APPENDECTOMY  1967   BILIARY STENT PLACEMENT N/A 04/18/2022   Procedure: BILIARY STENT PLACEMENT;  Surgeon: Iva Boop, MD;  Location: [facility];  Service: Gastroenterology;  Laterality: N/A;   CESAREAN SECTION  1976 & 1983   COLONOSCOPY  08/09/2017   danis - polyps   ERCP N/A 04/18/2022   Procedure: ENDOSCOPIC RETROGRADE CHOLANGIOPANCREATOGRAPHY (ERCP);  Surgeon: Iva Boop, MD;  Location: [facility];  Service: Gastroenterology;  Laterality: N/A;   ESOPHAGOGASTRODUODENOSCOPY (EGD) WITH PROPOFOL N/A 04/21/2022   Procedure: ESOPHAGOGASTRODUODENOSCOPY (EGD) WITH PROPOFOL;  Surgeon: Iva Boop, MD;  Location: [facility];  Service: Gastroenterology;  Laterality: N/A;   ILEOSTOMY CLOSURE N/A 07/10/2018   Procedure: ILEOSTOMY LOOP TAKEDOWN WITH TAP BLOCK;  Surgeon: Karie Soda, MD;  Location: [facility];  Service: General;  Laterality: N/A;   IR BILIARY DRAIN PLACEMENT WITH CHOLANGIOGRAM   05/11/2022   IR CONVERT BILIARY DRAIN TO INT EXT BILIARY DRAIN  04/26/2022   IR EXCHANGE BILIARY DRAIN  05/11/2022   IR EXCHANGE BILIARY DRAIN  06/30/2022   IR IMAGING GUIDED PORT INSERTION  09/29/2019   IR PERCUTANEOUS TRANSHEPATIC CHOLANGIOGRAM  04/21/2022   OSTOMY N/A 12/21/2017   Procedure: DIVERTING LOOP OSTOMY;  Surgeon: Karie Soda, MD;  Location: [facility];  Service: General;  Laterality: N/A;   PROCTOSCOPY N/A 12/21/2017   Procedure: RIGID PROCTOSCOPY;  Surgeon: Karie Soda, MD;  Location: [facility];  Service: General;  Laterality: N/A;   RADIOLOGY WITH ANESTHESIA N/A 04/26/2022   Procedure: PERCUTANEOUS TRANSHEPATIC  CHOLANGIOGRAM;  Surgeon: Simonne Come, MD;  Location: [facility];  Service: Anesthesiology;  Laterality: N/A;   STENT REMOVAL  04/21/2022   Procedure: STENT REMOVAL;  Surgeon: Iva Boop, MD;  Location: [facility];  Service: Gastroenterology;;   XI ROBOTIC ASSISTED LOWER ANTERIOR RESECTION N/A 12/21/2017   Procedure: XI ROBOTIC ASSISTED LOWER ANTERIOR RESECTION;  Surgeon: Karie Soda, MD;  Location: [facility];  Service: General;  Laterality: N/A;    Social History:  reports that she has never smoked. She has never used smokeless tobacco. She reports that she does not drink alcohol and does not use drugs.   Allergies  Allergen Reactions   Epinephrine Palpitations   Losartan Hives   Latex Itching and Other (See Comments)    Skin redness   Vectibix [Panitumumab] Other (See Comments) and Cough    Non-productive coughing with chest tightness.   Patient had hypersensitivity reaction to Vectibix. See progress note from 11/01/2021 at 4:33 PM. Patient able to complete infusion.     Family History  Problem Relation Age of Onset   Diabetes Mother    Hypertension Mother    COPD Mother    Glaucoma Father 38   Stroke Maternal Grandmother 13   Hypertension Maternal Grandmother    Rheum arthritis Sister 24   Hypertension Daughter    Breast cancer Cousin    Colon cancer Neg  Hx    Esophageal cancer Neg Hx    Liver cancer Neg Hx    Pancreatic cancer Neg Hx    Stomach cancer Neg Hx    Rectal cancer Neg Hx      Prior to Admission medications   Medication Sig Start Date End Date Taking? Authorizing Provider  levothyroxine (SYNTHROID) 50 MCG tablet Take 1 tablet by mouth once daily 05/23/22   Ladene Artist, MD  lidocaine-prilocaine (EMLA) cream Apply 1 Application topically as needed. Apply to port site 1-2 hours prior to use Patient taking differently: Apply 1 Application topically as needed (for port access- one to two hours prior to accessing). 12/12/21   Rana Snare, NP  magnesium oxide (MAG-OX) 400 (240 Mg) MG tablet Take 1 tablet (400 mg total) by mouth 2 (two) times daily. 02/22/22   Ladene Artist, MD  Nutritional Supplements (ENSURE ORIGINAL) LIQD Take 237 mLs by mouth See admin instructions. Drink 237 ml's (1 CHOCOLATE shake) by mouth once a day    [provider]  ondansetron (ZOFRAN) 8 MG tablet Take 1 tablet (8 mg total) by mouth every 8 (eight) hours as needed for nausea or vomiting. 06/28/22   Ladene Artist, MD  oxyCODONE (OXY IR/ROXICODONE) 5 MG immediate release tablet Take 1-2 tablets (5-10 mg total) by mouth every 4 (four) hours as needed for severe pain. 06/01/22   Ladene Artist, MD  polyethylene glycol (MIRALAX / GLYCOLAX) 17 g packet Take 17 g by mouth daily as needed for mild constipation. 07/01/22   Azucena Fallen, MD  potassium chloride (MICRO-K) 10 MEQ CR capsule Take 10 mEq by mouth in the morning, at noon, and at bedtime.    [provider]  predniSONE (DELTASONE) 5 MG tablet Take 1 tablet (5 mg total) by mouth daily with breakfast. 06/21/22   Rana Snare, NP  prochlorperazine (COMPAZINE) 10 MG tablet Take 1 tablet (10 mg total) by mouth every 6 (six) hours as needed for nausea or vomiting. 06/21/22   Rana Snare, NP  SYSTANE ULTRA PF 0.4-0.3 % SOLN Place 1 drop into both eyes 3 (three) times daily as needed  (for dryness).    [provider]    Physical Exam: BP (!) 114/58   Pulse 79   Temp 97.7 F (36.5 C) (Oral)   Resp 16   LMP  (LMP Unknown)   SpO2 100%   General:  Alert, oriented, calm,, chronically ill-appearing and impressively jaundiced Eyes: EOMI, clear conjuctivae, icteric sclerea Neck: supple, no masses, trachea mildline  Cardiovascular: RRR, no murmurs or rubs, no peripheral edema  Respiratory: clear to auscultation bilaterally, no wheezes, no crackles  Abdomen: soft, nontender, nondistended, normal bowel tones heard; there is an IR drain protruding from the right upper quadrant, with dark brown and bilious material in the bag. Skin: dry, no rashes  Musculoskeletal: no joint effusions, normal range of motion  Psychiatric: appropriate affect, normal speech  Neurologic: extraocular muscles intact, clear speech, moving all extremities with intact sensorium          Labs on Admission:  Basic Metabolic Panel: Recent Labs  Lab 07/17/22 1123  NA 133*  K 3.4*  CL 106  CO2 12*  GLUCOSE 99  BUN 58*  CREATININE 2.70*  CALCIUM 7.7*   Liver Function Tests: Recent Labs  Lab 07/17/22 1123  AST 93*  ALT 38  ALKPHOS 124  BILITOT 21.5*  PROT 5.4*  ALBUMIN 2.1*   Recent Labs  Lab 07/17/22 1123  LIPASE 31   No results for input(s): "AMMONIA" in the last 168 hours. CBC: Recent Labs  Lab 07/17/22 1123  WBC 5.6  NEUTROABS 4.3  HGB 10.2*  HCT 28.9*  MCV 98.3  PLT 113*   Cardiac Enzymes: No results for input(s): "CKTOTAL", "CKMB", "CKMBINDEX", "TROPONINI" in the last 168 hours.  BNP (last 3 results) Recent Labs    04/21/22 1835 06/30/22 0050  BNP 164.9* 137.8*    ProBNP (last 3 results) No results for input(s): "PROBNP" in the last 8760 hours.  CBG: No results for input(s): "GLUCAP" in the last 168 hours.  Radiological Exams on Admission: CT ABDOMEN PELVIS WO CONTRAST  Result Date: 07/17/2022 CLINICAL DATA:  History of metastatic rectal  cancer with biliary obstruction status post stent placement presenting with 2 weeks of generalized weakness. * Tracking Code: BO * EXAM: CT ABDOMEN AND PELVIS WITHOUT CONTRAST TECHNIQUE: Multidetector CT imaging of the abdomen and pelvis was performed following the standard protocol without IV contrast. RADIATION DOSE REDUCTION: This exam  was performed according to the departmental dose-optimization program which includes automated exposure control, adjustment of the mA and/or kV according to patient size and/or use of iterative reconstruction technique. COMPARISON:  CT abdomen and pelvis dated 05/25/2022 FINDINGS: Lower chest: Partially imaged central venous catheter tip terminates in the right atrium. Similar right lower lobe mass. No pleural effusion or pneumothorax demonstrated. Partially imaged heart size is normal. Hepatobiliary: No focal hepatic lesions. Internal external biliary drain again seen with distal aspect of stent now extending to the third portion of the duodenum. No intra or extrahepatic biliary ductal dilation. Cholelithiasis. Pancreas: No focal lesions or main ductal dilation. Spleen: Normal in size without focal abnormality. Adrenals/Urinary Tract: No adrenal nodules. No suspicious renal mass or hydronephrosis. Punctate nonobstructing bilateral renal stones. Trace nondependent gas within the urinary bladder. Stomach/Bowel: Normal appearance of the stomach. Postsurgical changes of the rectum with similar presacral soft tissue thickening/edema. Previously noted anal rectal wall thickening is suboptimally evaluated due to underdistention. Appendix is not discretely seen. Vascular/Lymphatic: Aortic atherosclerosis. Paraesophageal varices. No enlarged abdominal or pelvic lymph nodes. Reproductive: No adnexal masses. Other: No free fluid, fluid collection, or free air. Musculoskeletal: No acute or abnormal lytic or blastic osseous lesions. Multilevel degenerative changes of the partially imaged  thoracic and lumbar spine. IMPRESSION: 1. Internal external biliary drain again seen with distal aspect of stent now extending to the third portion of the duodenum. No intra or extrahepatic biliary ductal dilation. 2. Similar right lower lobe lung mass. 3. Postsurgical changes of the rectum with similar presacral soft tissue thickening/edema. Previously noted anorectal wall thickening is suboptimally evaluated due to underdistention. 4. Trace nondependent gas within the urinary bladder. Recommend correlation with history of recent instrumentation and urinalysis. 5.  Aortic Atherosclerosis (ICD10-I70.0). Electronically Signed   By: Agustin Cree M.D.   On: 07/17/2022 15:18    Assessment/Plan: This is an unfortunate 72 year old female with a history of GERD, hypothyroidism, metastatic rectal cancer with chronic hyperbilirubinemia due to central liver mass with right-sided biliary drain being admitted to the hospital with failure to thrive, clinical dehydration and acute renal failure.  Acute renal failure-on baseline normal renal function, this is due to dehydration and ATN from reduced oral intake. -Observation admission -Avoid nephrotoxins as able -Hydrate with normal saline at 75 cc/h -Recheck renal function in the morning  Chronically elevated T. bili-due to central liver obstruction from liver mass, continue biliary drain.  Note CT scan today without acute findings.  Will replace drainage bag.  Anemia of chronic disease-stable  Chronic thrombocytopenia-improved from previous  Failure to thrive-continue Ensure, RD consult, PT/OT  Hypothyroidism-Synthroid 50 mcg p.o. daily  Widely metastatic rectal cancer-fortunately this is her underlying primary issue, per most recent oncology notes, she is not a candidate for any further treatment, even of a palliative nature.  Plan at last discharge was for patient to enroll in [facility] home hospice but she has not felt ready to do so.  This afternoon,  patient tells me that she would like to remain a full code as she is still contemplative about her CODE STATUS, and goals of care.  Will consult palliative care for further guidance.  DVT prophylaxis: Lovenox     Code Status: Full Code  Consults called: None, her oncologist Dr. Alcide Evener was added to the treatment team.  Admission status: Observation  Time spent: 46 minutes  Saba Neuman Sharlette Dense MD [facility] Pager [phone]  If 7PM-7AM, please contact night-coverage [email or web address] Password [facility]  07/17/2022, 3:42 PM

## 2022-07-18 DIAGNOSIS — I4891 Unspecified atrial fibrillation: Secondary | ICD-10-CM | POA: Diagnosis not present

## 2022-07-18 DIAGNOSIS — D709 Neutropenia, unspecified: Secondary | ICD-10-CM | POA: Diagnosis present

## 2022-07-18 DIAGNOSIS — G893 Neoplasm related pain (acute) (chronic): Secondary | ICD-10-CM | POA: Diagnosis not present

## 2022-07-18 DIAGNOSIS — Z79890 Hormone replacement therapy (postmenopausal): Secondary | ICD-10-CM | POA: Diagnosis not present

## 2022-07-18 DIAGNOSIS — L900 Lichen sclerosus et atrophicus: Secondary | ICD-10-CM | POA: Diagnosis present

## 2022-07-18 DIAGNOSIS — E440 Moderate protein-calorie malnutrition: Secondary | ICD-10-CM | POA: Diagnosis present

## 2022-07-18 DIAGNOSIS — C19 Malignant neoplasm of rectosigmoid junction: Secondary | ICD-10-CM | POA: Diagnosis present

## 2022-07-18 DIAGNOSIS — C20 Malignant neoplasm of rectum: Secondary | ICD-10-CM

## 2022-07-18 DIAGNOSIS — Z7189 Other specified counseling: Secondary | ICD-10-CM

## 2022-07-18 DIAGNOSIS — K766 Portal hypertension: Secondary | ICD-10-CM | POA: Diagnosis present

## 2022-07-18 DIAGNOSIS — R627 Adult failure to thrive: Secondary | ICD-10-CM | POA: Diagnosis present

## 2022-07-18 DIAGNOSIS — D6959 Other secondary thrombocytopenia: Secondary | ICD-10-CM | POA: Diagnosis present

## 2022-07-18 DIAGNOSIS — C799 Secondary malignant neoplasm of unspecified site: Secondary | ICD-10-CM | POA: Diagnosis present

## 2022-07-18 DIAGNOSIS — R7989 Other specified abnormal findings of blood chemistry: Secondary | ICD-10-CM | POA: Diagnosis not present

## 2022-07-18 DIAGNOSIS — K831 Obstruction of bile duct: Secondary | ICD-10-CM | POA: Diagnosis present

## 2022-07-18 DIAGNOSIS — Z79899 Other long term (current) drug therapy: Secondary | ICD-10-CM

## 2022-07-18 DIAGNOSIS — Z8249 Family history of ischemic heart disease and other diseases of the circulatory system: Secondary | ICD-10-CM | POA: Diagnosis not present

## 2022-07-18 DIAGNOSIS — N179 Acute kidney failure, unspecified: Secondary | ICD-10-CM | POA: Diagnosis not present

## 2022-07-18 DIAGNOSIS — Y842 Radiological procedure and radiotherapy as the cause of abnormal reaction of the patient, or of later complication, without mention of misadventure at the time of the procedure: Secondary | ICD-10-CM | POA: Diagnosis present

## 2022-07-18 DIAGNOSIS — F419 Anxiety disorder, unspecified: Secondary | ICD-10-CM | POA: Diagnosis present

## 2022-07-18 DIAGNOSIS — I8500 Esophageal varices without bleeding: Secondary | ICD-10-CM | POA: Diagnosis present

## 2022-07-18 DIAGNOSIS — E039 Hypothyroidism, unspecified: Secondary | ICD-10-CM | POA: Diagnosis present

## 2022-07-18 DIAGNOSIS — R4589 Other symptoms and signs involving emotional state: Secondary | ICD-10-CM

## 2022-07-18 DIAGNOSIS — C7800 Secondary malignant neoplasm of unspecified lung: Secondary | ICD-10-CM | POA: Diagnosis present

## 2022-07-18 DIAGNOSIS — N170 Acute kidney failure with tubular necrosis: Secondary | ICD-10-CM | POA: Diagnosis present

## 2022-07-18 DIAGNOSIS — Z711 Person with feared health complaint in whom no diagnosis is made: Secondary | ICD-10-CM | POA: Diagnosis not present

## 2022-07-18 DIAGNOSIS — J703 Chronic drug-induced interstitial lung disorders: Secondary | ICD-10-CM | POA: Diagnosis present

## 2022-07-18 DIAGNOSIS — E860 Dehydration: Secondary | ICD-10-CM | POA: Diagnosis present

## 2022-07-18 DIAGNOSIS — Z515 Encounter for palliative care: Secondary | ICD-10-CM | POA: Diagnosis not present

## 2022-07-18 DIAGNOSIS — E669 Obesity, unspecified: Secondary | ICD-10-CM | POA: Diagnosis present

## 2022-07-18 DIAGNOSIS — R160 Hepatomegaly, not elsewhere classified: Secondary | ICD-10-CM | POA: Diagnosis present

## 2022-07-18 DIAGNOSIS — D630 Anemia in neoplastic disease: Secondary | ICD-10-CM | POA: Diagnosis present

## 2022-07-18 DIAGNOSIS — Z66 Do not resuscitate: Secondary | ICD-10-CM | POA: Diagnosis present

## 2022-07-18 LAB — CBC
HCT: 28.6 % — ABNORMAL LOW (ref 36.0–46.0)
Hemoglobin: 9.9 g/dL — ABNORMAL LOW (ref 12.0–15.0)
MCH: 33.9 pg (ref 26.0–34.0)
MCHC: 34.6 g/dL (ref 30.0–36.0)
MCV: 97.9 fL (ref 80.0–100.0)
Platelets: 107 10*3/uL — ABNORMAL LOW (ref 150–400)
RBC: 2.92 MIL/uL — ABNORMAL LOW (ref 3.87–5.11)
RDW: 15.9 % — ABNORMAL HIGH (ref 11.5–15.5)
WBC: 5 10*3/uL (ref 4.0–10.5)
nRBC: 0 % (ref 0.0–0.2)

## 2022-07-18 LAB — COMPREHENSIVE METABOLIC PANEL
AST: 103 U/L — ABNORMAL HIGH (ref 15–41)
Albumin: 2 g/dL — ABNORMAL LOW (ref 3.5–5.0)
Alkaline Phosphatase: 122 U/L (ref 38–126)
CO2: 15 mmol/L — ABNORMAL LOW (ref 22–32)
Calcium: 8.1 mg/dL — ABNORMAL LOW (ref 8.9–10.3)
Chloride: 106 mmol/L (ref 98–111)
Sodium: 133 mmol/L — ABNORMAL LOW (ref 135–145)
Total Bilirubin: 30.7 mg/dL (ref 0.3–1.2)

## 2022-07-18 MED ORDER — SODIUM CHLORIDE 0.9% FLUSH: 10.0000 mL | INTRAVENOUS | Status: DC | PRN

## 2022-07-18 MED ORDER — BOOST PLUS PO LIQD
237.0000 mL | Freq: Three times a day (TID) | ORAL | Status: DC
  Administered 2022-07-18: 237 mL via ORAL
  Filled 2022-07-18 (×3): qty 237

## 2022-07-18 NOTE — Consult Note (Signed)
Consultation Note Date: 07/18/2022   Patient Name: Samantha Lutz  DOB: 1950/05/28  MRN: [id number]  Age / Sex: 72 y.o., female   PCP: Deborah Chalk, FNP Referring Physician: Barnetta Chapel, MD  Reason for Consultation: Establishing goals of care     Chief Complaint/History of Present Illness:   Patient is a 72 year old female with a past medical history of metastatic rectal cancer, hypothyroidism, esophageal varices, and obstructive jaundice secondary to central liver mass with biliary stent placement who was admitted on 07/17/2022 for management of dehydration and acute renal failure.  Patient had recently been hospitalized and was recommended to return home with hospice at that time.  Patient followed by Dr. Truett Perna, oncology.  Palliative medicine team consulted to assist with complex medical decision making.  Extensive EMR review prior to presenting to bedside.  When presenting to bedside, patient laying comfortably in bed.  No family present at bedside.  Introduced myself and the role the palliative medicine team in patient's medical care.  Patient able to update me regarding discussion she had with Dr. Truett Perna, oncologist, this morning.  Patient is wanting to return home with "home care".  Upon further discussion of this, determined patient is wanting to return home with hospice support to focus on symptom management at the end of life.  Patient noted that she is no longer independent in activities and so unsure if her daughters can assist with care at home.  She asked that TOC got to her daughter, Samantha Lutz, about possibility of returning home with hospice and 24/7 support from family.  Acknowledged this.  Spent time providing emotional support via active listening.  Patient's only symptom concern at this time was abdominal pain.  Patient has oxycodone listed in EMR.  Noted would ask RN to bring dose.  Patient voiced appreciation for visit today.  Discussed care with IDT after  visit to coordinate.  Primary Diagnoses  Present on Admission:  AKI (acute kidney injury) (HCC)   Palliative Review of Systems: Abdominal pain  Past Medical History:  Diagnosis Date   Anemia    remote hx.   Arthritis    Bi lat knee   Cancer (HCC) 07/2017   Rectal Cancer    Fatty liver    ultrasound December 2012   Hypothyroid 01/16/2013 dx   Irregular heartbeat    PER PATIENT REPORT; ONSET 20+YEARS AGO SAW CARDIOLOGY AT THE TIME C/O "ITS BEAT REALLY FAST FOR A MINUTE AND THEN STOPPED " DENIES ANY OTHER CARDIAC SX';  REPORTS CARDIO DID ECHO WHICH WAS NEGATIVE;  NOW COMES AND GOES ;    Lichen sclerosus    steroids as needed   Temporary low platelet count (HCC)    SEE LAST LABS IN EPIC   Social History   Socioeconomic History   Marital status: Widowed    Spouse name: Not on file   Number of children: Not on file   Years of education: Not on file   Highest education level: Not on file  Occupational History   Not on file  Tobacco Use   Smoking status: Never   Smokeless tobacco: Never  Vaping Use   Vaping Use: Never used  Substance and Sexual Activity   Alcohol use: No   Drug use: No   Sexual activity: Yes    Birth control/protection: Post-menopausal  Other Topics Concern   Not on file  Social History Narrative   Not on file   Social Determinants of Health   Financial Resource Strain:  Low Risk  (11/01/2021)   Overall Financial Resource Strain (CARDIA)    Difficulty of Paying Living Expenses: Not hard at all  Food Insecurity: No Food Insecurity (07/17/2022)   Hunger Vital Sign    Worried About Running Out of Food in the Last Year: Never true    Ran Out of Food in the Last Year: Never true  Transportation Needs: No Transportation Needs (07/17/2022)   PRAPARE - Administrator, Civil Service (Medical): No    Lack of Transportation (Non-Medical): No  Physical Activity: Not on file  Stress: Not on file  Social Connections: Socially Isolated (11/01/2021)    Social Connection and Isolation Panel [NHANES]    Frequency of Communication with Friends and Family: More than three times a week    Frequency of Social Gatherings with Friends and Family: More than three times a week    Attends Religious Services: Never    Database administrator or Organizations: No    Attends Banker Meetings: Never    Marital Status: Widowed   Family History  Problem Relation Age of Onset   Diabetes Mother    Hypertension Mother    COPD Mother    Glaucoma Father 45   Stroke Maternal Grandmother 91   Hypertension Maternal Grandmother    Rheum arthritis Sister 94   Hypertension Daughter    Breast cancer Cousin    Colon cancer Neg Hx    Esophageal cancer Neg Hx    Liver cancer Neg Hx    Pancreatic cancer Neg Hx    Stomach cancer Neg Hx    Rectal cancer Neg Hx    Scheduled Meds:  Chlorhexidine Gluconate Cloth  6 each Topical Daily   enoxaparin (LOVENOX) injection  30 mg Subcutaneous Q24H   feeding supplement  237 mL Oral Daily   levothyroxine  50 mcg Oral Q0600   sodium chloride flush  10-40 mL Intracatheter Q12H   Continuous Infusions:  sodium chloride 100 mL/hr at 07/18/22 0057   PRN Meds:.acetaminophen **OR** acetaminophen, albuterol, ondansetron **OR** ondansetron (ZOFRAN) IV, oxyCODONE, senna-docusate, sodium chloride flush, sodium chloride flush Allergies  Allergen Reactions   Epinephrine Palpitations   Losartan Hives   Latex Itching and Other (See Comments)    Skin redness   Vectibix [Panitumumab] Other (See Comments) and Cough    Non-productive coughing with chest tightness.   Patient had hypersensitivity reaction to Vectibix. See progress note from 11/01/2021 at 4:33 PM. Patient able to complete infusion.    CBC:    Component Value Date/Time   WBC 5.0 07/18/2022 0331   HGB 9.9 (L) 07/18/2022 0331   HGB 12.5 06/01/2022 1059   HCT 28.6 (L) 07/18/2022 0331   PLT 107 (L) 07/18/2022 0331   PLT 133 (L) 06/01/2022 1059   MCV  97.9 07/18/2022 0331   NEUTROABS 4.3 07/17/2022 1123   LYMPHSABS 0.4 (L) 07/17/2022 1123   MONOABS 0.8 07/17/2022 1123   EOSABS 0.0 07/17/2022 1123   BASOSABS 0.0 07/17/2022 1123   Comprehensive Metabolic Panel:    Component Value Date/Time   NA 133 (L) 07/18/2022 0331   K 3.8 07/18/2022 0331   CL 106 07/18/2022 0331   CO2 15 (L) 07/18/2022 0331   BUN 60 (H) 07/18/2022 0331   CREATININE 2.55 (H) 07/18/2022 0331   CREATININE 0.94 06/01/2022 1059   GLUCOSE 83 07/18/2022 0331   CALCIUM 8.1 (L) 07/18/2022 0331   AST 103 (H) 07/18/2022 0331   AST 70 (H) 06/01/2022 1059  ALT 41 07/18/2022 0331   ALT 36 06/01/2022 1059   ALKPHOS 122 07/18/2022 0331   BILITOT 30.7 (HH) 07/18/2022 0331   BILITOT 11.4 (HH) 06/01/2022 1059   PROT 5.4 (L) 07/18/2022 0331   ALBUMIN 2.0 (L) 07/18/2022 0331    Physical Exam: Vital Signs: BP (!) 115/53 (BP Location: Left Arm)   Pulse 77   Temp 97.9 F (36.6 C)   Resp 18   Ht 5\' 2"  (1.575 m)   Wt 81.1 kg   LMP  (LMP Unknown)   SpO2 100%   BMI 32.70 kg/m  SpO2: SpO2: 100 % O2 Device: O2 Device: Room Air O2 Flow Rate:   Intake/output summary:  Intake/Output Summary (Last 24 hours) at 07/18/2022 0901 Last data filed at 07/18/2022 0300 Gross per 24 hour  Intake 1543.67 ml  Output 50 ml  Net 1493.67 ml   LBM: Last BM Date : 07/16/22 Baseline Weight: Weight: 81.1 kg Most recent weight: Weight: 81.1 kg  General: NAD, alert, chronically ill appearing Eyes: Scleral icterus HENT: dry mucous membranes Cardiovascular: RRR Respiratory: no increased work of breathing noted, not in respiratory distress Abdomen: distended Skin: Jaundiced Neuro: A&Ox4, following commands easily Psych: appropriately answers all questions          Palliative Performance Scale: 30%              Additional Data Reviewed: Recent Labs    07/17/22 1123 07/18/22 0331  WBC 5.6 5.0  HGB 10.2* 9.9*  PLT 113* 107*  NA 133* 133*  BUN 58* 60*  CREATININE 2.70* 2.55*     Imaging: CT ABDOMEN PELVIS WO CONTRAST CLINICAL DATA:  History of metastatic rectal cancer with biliary obstruction status post stent placement presenting with 2 weeks of generalized weakness. * Tracking Code: BO *  EXAM: CT ABDOMEN AND PELVIS WITHOUT CONTRAST  TECHNIQUE: Multidetector CT imaging of the abdomen and pelvis was performed following the standard protocol without IV contrast.  RADIATION DOSE REDUCTION: This exam was performed according to the departmental dose-optimization program which includes automated exposure control, adjustment of the mA and/or kV according to patient size and/or use of iterative reconstruction technique.  COMPARISON:  CT abdomen and pelvis dated 05/25/2022  FINDINGS: Lower chest: Partially imaged central venous catheter tip terminates in the right atrium. Similar right lower lobe mass. No pleural effusion or pneumothorax demonstrated. Partially imaged heart size is normal.  Hepatobiliary: No focal hepatic lesions. Internal external biliary drain again seen with distal aspect of stent now extending to the third portion of the duodenum. No intra or extrahepatic biliary ductal dilation. Cholelithiasis.  Pancreas: No focal lesions or main ductal dilation.  Spleen: Normal in size without focal abnormality.  Adrenals/Urinary Tract: No adrenal nodules. No suspicious renal mass or hydronephrosis. Punctate nonobstructing bilateral renal stones. Trace nondependent gas within the urinary bladder.  Stomach/Bowel: Normal appearance of the stomach. Postsurgical changes of the rectum with similar presacral soft tissue thickening/edema. Previously noted anal rectal wall thickening is suboptimally evaluated due to underdistention. Appendix is not discretely seen.  Vascular/Lymphatic: Aortic atherosclerosis. Paraesophageal varices. No enlarged abdominal or pelvic lymph nodes.  Reproductive: No adnexal masses.  Other: No free fluid, fluid  collection, or free air.  Musculoskeletal: No acute or abnormal lytic or blastic osseous lesions. Multilevel degenerative changes of the partially imaged thoracic and lumbar spine.  IMPRESSION: 1. Internal external biliary drain again seen with distal aspect of stent now extending to the third portion of the duodenum. No intra or extrahepatic biliary  ductal dilation. 2. Similar right lower lobe lung mass. 3. Postsurgical changes of the rectum with similar presacral soft tissue thickening/edema. Previously noted anorectal wall thickening is suboptimally evaluated due to underdistention. 4. Trace nondependent gas within the urinary bladder. Recommend correlation with history of recent instrumentation and urinalysis. 5.  Aortic Atherosclerosis (ICD10-I70.0).  Electronically Signed   By: Agustin Cree M.D.   On: 07/17/2022 15:18    I personally reviewed recent imaging.   Palliative Care Assessment and Plan Summary of Established Goals of Care and Medical Treatment Preferences   Patient is a 72 year old female with a past medical history of metastatic rectal cancer, hypothyroidism, esophageal varices, and obstructive jaundice secondary to central liver mass with biliary stent placement who was admitted on 07/17/2022 for management of dehydration and acute renal failure.  Patient had recently been hospitalized and was recommended to return home with hospice at that time.  Patient followed by Dr. Truett Perna, oncology.  Palliative medicine team consulted to assist with complex medical decision making.  # Complex medical decision making/goals of care  -Discussion with patient as described above in HPI. Patient has heard that are no further appropraite medical interventions directed towards her caner. Patient now agreeing to return home with hospice. Consult TOC to assist with coordination of care.   -  Code Status: Full Code   # Symptom management  -Pain, in setting of metastatic rectal  cancer   -Continue oxycodone 5-10mg  q4hrs prn   # Psycho-social/Spiritual Support:  - Support System: daughters  # Discharge Planning:  TBD. Patient would like to return home with hospice with 24/7 support from family. Appreciate TOC's assistance with coordination.   Thank you for allowing the palliative care team to participate in the care Delrae Sawyers.  Alvester Morin, DO Palliative Care Provider PMT # [phone]  If patient remains symptomatic despite maximum doses, please call PMT at [phone] between 0700 and 1900. Outside of these hours, please call attending, as PMT does not have night coverage.  This provider spent a total of 80 minutes providing patient's care.  Includes review of EMR, discussing care with other staff members involved in patient's medical care, obtaining relevant history and information from patient and/or patient's family, and personal review of imaging and lab work. Greater than 50% of the time was spent counseling and coordinating care related to the above assessment and plan.    *Please note that this is a verbal dictation therefore any spelling or grammatical errors are due to the "[employer] One" system interpretation.

## 2022-07-18 NOTE — Progress Notes (Signed)
Initial Nutrition Assessment  DOCUMENTATION CODES:   Non-severe (moderate) malnutrition in context of chronic illness, Obesity unspecified  INTERVENTION:   -Boost Plus TID- Each supplement provides 360kcal and 14g protein.    -Multivitamin with minerals daily   NUTRITION DIAGNOSIS:   Moderate Malnutrition related to chronic illness, cancer and cancer related treatments as evidenced by moderate fat depletion, mild muscle depletion, energy intake < or equal to 75% for > or equal to 1 month.  GOAL:   Patient will meet greater than or equal to 90% of their needs  MONITOR:   PO intake, Supplement acceptance, Labs, Weight trends, I & O's  REASON FOR ASSESSMENT:   Consult Assessment of nutrition requirement/status  ASSESSMENT:   72 y.o. female with medical history significant for metastatic rectal cancer followed by Dr. Alcide Evener, multifocal pulmonary metastasis with trace right pleural effusion, hypothyroidism, esophageal varices, obstructive jaundice secondary to central liver mass with biliary stent placement being admitted to the hospital with dehydration and acute renal failure.  Patient in room, no family at bedside. Pt reports she has been only consuming 1 meal day which may be a bowl of soup. Pt does consume Ensure, prefers lower protein supplements which seem thinner. She was told to add milk to her supplements of higher protein supplements but still prefers Original Ensure. Pt agreeable to trying Boost Plus to see if this is thinner in consistency.  Pt had not eaten anything yet today at time of visit.   Per weight records, pt has lost 32 lbs since 3/14 (15% wt loss x 3 months, significant for time frame).  Medications reviewed.  Labs reviewed: Low Na   NUTRITION - FOCUSED PHYSICAL EXAM:  Flowsheet Row Most Recent Value  Orbital Region Moderate depletion  Upper Arm Region Moderate depletion  Thoracic and Lumbar Region No depletion  Buccal Region Mild depletion   Temple Region Moderate depletion  Clavicle Bone Region Mild depletion  Clavicle and Acromion Bone Region Mild depletion  Scapular Bone Region Mild depletion  Dorsal Hand Mild depletion  Patellar Region No depletion  Anterior Thigh Region No depletion  Posterior Calf Region No depletion  Edema (RD Assessment) None  Hair Reviewed  Eyes Reviewed  Mouth Reviewed  Skin Reviewed  [jaundice]       Diet Order:   Diet Order             Diet regular Room service appropriate? Yes; Fluid consistency: Thin  Diet effective now                   EDUCATION NEEDS:   No education needs have been identified at this time  Skin:  Skin Assessment: Reviewed RN Assessment  Last BM:  6/16  Height:   Ht Readings from Last 1 Encounters:  07/17/22 5\' 2"  (1.575 m)    Weight:   Wt Readings from Last 1 Encounters:  07/17/22 81.1 kg    BMI:  Body mass index is 32.7 kg/m.  Estimated Nutritional Needs:   Kcal:  1500-1700  Protein:  75-85g  Fluid:  1.7L/day  Tilda Franco, MS, RD, LDN Inpatient Clinical Dietitian Contact information available via Amion

## 2022-07-18 NOTE — Progress Notes (Signed)
IP PROGRESS NOTE  Subjective:   Samantha Lutz is well-known to me with history of metastatic rectal cancer.  She was admitted last month with failure to thrive and persistent hyperbilirubinemia.  She underwent further evaluation by interventional radiology on 06/30/2022 and there was no option for draining the left-sided biliary system.  She underwent exchange of a right sided internal/external drain.  Samantha Lutz reports persistent weakness.  She is unable to care for herself in the home.  She presented to the emergency room yesterday with neurolyse weakness.  She reports being unable to perform her usual activities at home.  No pain.  No change in dyspnea.  Objective: Vital signs in last 24 hours: Blood pressure (!) 115/53, pulse 77, temperature 97.9 F (36.6 C), resp. rate 18, height 5\' 2"  (1.575 m), weight 178 lb 12.7 oz (81.1 kg), SpO2 100 %.  Intake/Output from previous day: 06/17 0701 - 06/18 0700 In: 1543.7 [I.V.:823.3; IV Piggyback:720.3] Out: 50 [Drains:50]  Physical Exam:  HEENT: Scleral icterus Lungs: Decreased breath sounds at the right lower posterior chest, no respiratory distress Cardiac: Regular rate and rhythm Abdomen: Right upper quadrant biliary drain, no hepatosplenomegaly, nontender Extremities: No leg edema Skin: Jaundice Neurologic: Alert and oriented  Portacath/PICC-without erythema  Lab Results: Recent Labs    07/17/22 1123 07/18/22 0331  WBC 5.6 5.0  HGB 10.2* 9.9*  HCT 28.9* 28.6*  PLT 113* 107*    BMET Recent Labs    07/17/22 1123 07/18/22 0331  NA 133* 133*  K 3.4* 3.8  CL 106 106  CO2 12* 15*  GLUCOSE 99 83  BUN 58* 60*  CREATININE 2.70* 2.55*  CALCIUM 7.7* 8.1*    Lab Results  Component Value Date   CEA1 <1.00 06/01/2020   CEA <1.00 06/01/2020    Studies/Results: CT ABDOMEN PELVIS WO CONTRAST  Result Date: 07/17/2022 CLINICAL DATA:  History of metastatic rectal cancer with biliary obstruction status post stent placement  presenting with 2 weeks of generalized weakness. * Tracking Code: BO * EXAM: CT ABDOMEN AND PELVIS WITHOUT CONTRAST TECHNIQUE: Multidetector CT imaging of the abdomen and pelvis was performed following the standard protocol without IV contrast. RADIATION DOSE REDUCTION: This exam was performed according to the departmental dose-optimization program which includes automated exposure control, adjustment of the mA and/or kV according to patient size and/or use of iterative reconstruction technique. COMPARISON:  CT abdomen and pelvis dated 05/25/2022 FINDINGS: Lower chest: Partially imaged central venous catheter tip terminates in the right atrium. Similar right lower lobe mass. No pleural effusion or pneumothorax demonstrated. Partially imaged heart size is normal. Hepatobiliary: No focal hepatic lesions. Internal external biliary drain again seen with distal aspect of stent now extending to the third portion of the duodenum. No intra or extrahepatic biliary ductal dilation. Cholelithiasis. Pancreas: No focal lesions or main ductal dilation. Spleen: Normal in size without focal abnormality. Adrenals/Urinary Tract: No adrenal nodules. No suspicious renal mass or hydronephrosis. Punctate nonobstructing bilateral renal stones. Trace nondependent gas within the urinary bladder. Stomach/Bowel: Normal appearance of the stomach. Postsurgical changes of the rectum with similar presacral soft tissue thickening/edema. Previously noted anal rectal wall thickening is suboptimally evaluated due to underdistention. Appendix is not discretely seen. Vascular/Lymphatic: Aortic atherosclerosis. Paraesophageal varices. No enlarged abdominal or pelvic lymph nodes. Reproductive: No adnexal masses. Other: No free fluid, fluid collection, or free air. Musculoskeletal: No acute or abnormal lytic or blastic osseous lesions. Multilevel degenerative changes of the partially imaged thoracic and lumbar spine. IMPRESSION: 1. Internal external  biliary drain again seen with distal aspect of stent now extending to the third portion of the duodenum. No intra or extrahepatic biliary ductal dilation. 2. Similar right lower lobe lung mass. 3. Postsurgical changes of the rectum with similar presacral soft tissue thickening/edema. Previously noted anorectal wall thickening is suboptimally evaluated due to underdistention. 4. Trace nondependent gas within the urinary bladder. Recommend correlation with history of recent instrumentation and urinalysis. 5.  Aortic Atherosclerosis (ICD10-I70.0). Electronically Signed   By: Agustin Cree M.D.   On: 07/17/2022 15:18    Medications: I have reviewed the patient's current medications.  Assessment/Plan:  Rectal cancer Mass at 7 cm from the anal verge on colonoscopy 08/09/2017, biopsy revealed invasive adenocarcinoma Staging CTs 08/17/2017-no evidence of metastatic disease, asymmetric thickening in the mid rectum MR pelvis 09/01/2017, T3N0 lesion beginning at 6.3 cm from the anal sphincter Radiation/Xeloda initiated 09/17/2017, completed 10/25/2017 Low anterior resection/diverting ileostomy 12/21/2017,ypT3,ypN1a tumor.  Lymphovascular invasion present, intact mismatch repair protein expression, treatment effect present (TRS 1).  Mismatch repair protein IHC normal; Foundation 1-KRAS/NRAS wild-type, microsatellite status and tumor mutational burden could not be determined. Cycle 1 adjuvant Xeloda beginning 01/21/2018 Cycle 2 adjuvant Xeloda beginning 02/11/2018 Xeloda discontinued after cycle 2 secondary to patient preference Ileostomy takedown 07/10/2018 CTs 09/07/2018- multiple live small pulmonary nodules concerning for metastatic disease CT chest 12/10/2018-multiple bilateral lung nodules, some have increased in size CT chest 04/08/2019-mild enlargement of bilateral lung nodules Status post SBRT multiple lung nodules 05/06/2019, 05/08/2019, 05/13/2019 CT chest 08/28/2019-improvement and resolution in majority of right-sided  pulmonary nodules, a superior segment right lower lobe nodule has increased, no new right-sided nodules.  Progressive enlargement of left-sided pulmonary nodules PET scan 09/09/2019-hypermetabolic right lower lobe nodule, 2 hypermetabolic left upper lobe nodules with an additional 0.7 cm left upper lobe nodule below PET resolution, groundglass opacity in the mid to right lower lobe with associated hypermetabolism consistent with postradiation change, hypermetabolic central segment 4A liver lesion Cycle 1 FOLFOX 10/14/2019 Cycle  2FOLFOX 11/11/2019, 5-FU and oxaliplatin dose reduced secondary to neutropenia and thrombocytopenia following cycle one, G-CSF declined Cycle 3 FOLFOX 11/26/2019 Cycle 4 FOLFOX 12/11/2019, oxaliplatin held due to neutropenia and thrombocytopenia Cycle 5 FOLFOX 12/31/2019 Cycle 6 FOLFOX 01/14/2020 (oxaliplatin held, 5-fluorouracil dose reduced due to mucositis) CT chest 01/27/2020-improvement in left lung nodules, progressive airspace disease with traction bronchiectasis throughout the right lung, previously noted right lung nodules are obscured, mildly enlarged right paratracheal lymph nodes-not hypermetabolic on prior PET, likely reactive Cycle 7 FOLFOX 01/28/2020 Cycle 8 FOLFOX 02/11/2020 (oxaliplatin held due to neutropenia and thrombocytopenia) Cycle 9 FOLFOX 02/25/2020 (oxaliplatin held due to thrombocytopenia) Cycle 10 FOLFOX 03/11/2020 (oxaliplatin held secondary to neuropathy) Cycle 11 FOLFOX 03/25/2020 (oxaliplatin held due to neuropathy) CT chest 04/05/2020-no new or progressive findings.  Interval evolution of presumed postradiation scarring in the right perihilar lung with decrease in the more diffuse groundglass opacity seen previously.  No substantial change in left lung nodules. 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  No definite CT findings to suggest recurrent tumor.  Stable small left upper lobe pulmonary nodules.  No new or progressive findings.  Stable small right paratracheal lymph nodes.  Stable area of irregular low-attenuation in hepatic segment 4A, site of known prior hepatic metastatic lesion.  No new or progressive  findings. Cycle 18 5-fluorouracil 07/06/2020 Cycle 19 5-fluorouracil 07/20/2020 Cycle 20 5-fluorouracil 08/03/2020 Cycle 21 5-fluorouracil 08/17/2020  Cycle 22 5-fluorouracil 08/31/2020 Cycle 23 5-fluorouracil 09/14/2020 CT chest 09/24/2020-increased number and size of pulmonary nodules bilaterally.  Findings in the right upper lobe are concerning for potential lymphangitic spread of tumor.  Right upper lobe findings could also reflect evolving postradiation change. Cycle 1 FOLFIRI 10/12/2020 Cycle 2 FOLFIRI 11/08/2020, irinotecan dose reduced due to neutropenia and thrombocytopenia following cycle 1 Cycle 3 FOLFIRI 12/01/2020 Cycle 4 FOLFIRI 12/22/2020 CT chest 01/11/2021-similar bilateral pulmonary nodules.  Left upper lobe nodule has mildly decreased in size.  No new nodules. Cycle 5 FOLFIRI 01/12/2021 Cycle 6 FOLFIRI 02/03/2021 Cycle 7 FOLFIRI 03/01/2021 Cycle 8 FOLFIRI 03/22/2021 CT chest 04/08/2021-multiple small bilateral lung nodules not significantly changed.  No new nodules.  Unchanged enlarged pretracheal nodes.  Increase in fibrotic consolidation of the perihilar and inferior right lung. Cycle 9 FOLFIRI 04/12/2021 05/04/2021 treatment held due to fatigue Cycle 10 FOLFIRI 05/18/2021 06/07/2021-treatment held secondary to neutropenia Cycle 11 FOLFIRI 06/14/2021 Cycle 12 FOLFIRI 07/05/2021 Cycle 13 FOLFIRI 07/27/2021, irinotecan held 14 FOLFIRI 08/16/2021, irinotecan held CT chest 09/02/2021-enlargement of several lung nodules, no new  nodules, stable postradiation changes in the right lower lobe and right middle lobe, gastroesophageal varices identified Cycle one 5-FU/Panitumumab 11/01/2021 Cycle two 5-FU/panitumumab 11/29/2021-Solu-Medrol and Pepcid prophylaxis added after she developed a cough during the panitumumab with cycle 1 Cycle 3 5-FU/panitumumab 12/12/2021 Cycle four 5-FU/panitumumab 12/27/2021 CT chest 01/25/2022-mild progression of metastatic disease to the lungs with increased size but stable number of metastatic lesions. Cycle 1 Lonsurf 02/27/2022 Cycle 2 Lonsurf 04/10/2022, placed on hold 04/13/2022 due to LFT abnormalities CT abdomen/pelvis 04/13/2022-persistent/chronic intrahepatic biliary dilatation most notable left hepatic lobe; vague area of low-attenuation near the porta hepatis, MRI recommended. MRI liver 04/14/2022-hypoenhancing 2.7 x 1.9 cm central liver mass in the region of the biliary hilum.  Associated high-grade biliary stricture at the biliary hilum affecting the central right and left intrahepatic bile ducts. CT abdomen/pelvis 05/25/2022 mild left intraparotid duct dilation with an internal/external biliary drain unchanged, multifocal pulmonary metastases unchanged, trace right pleural effusion CT abdomen/pelvis 07/17/2022-internal/external biliary drain, stable right lower lobe mass,       2.   Hypothyroid   3.    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  Port-A-Cath placement 09/29/2019, interventional radiology   5.  Neutropenia and thrombocytopenia following cycle 1 FOLFOX-plan chemotherapy dose reductions, she declined G-CSF   6.  Mucositis following cycle 5 FOLFOX, 5-fluorouracil dose reduced with cycle 6 Mucositis following cycle 22 5-fluorouracil-Magic mouthwash added   7.  Right lung airspace disease/volume loss-likely toxicity from chest radiation, trial of prednisone 01/29/2020; dyspnea and cough improved 02/11/2020 Progressive cough  03/11/2020-prednisone resumed at a dose of 20 mg daily Cough and dyspnea improved 03/25/2020-prednisone taper to 15 mg daily Improved 04/06/2020-prednisone taper to 10 mg daily Stable 04/21/2020-prednisone taper to 5 mg daily   8.  Gastroesophageal varices    9.   Admission 04/17/2022 with obstructive jaundice secondary to a central liver mass ERCP 04/18/2022-biliary obstruction secondary to a central liver mass, status post stent placement MRI/MRCP-paraesophageal varices, or a stent extending from the duodenum to the confluence of the right and left hepatic duct stent does not extend into either the right or left duct, 4 x 1.7 cm central liver mass, suspicion for left portal vein thrombosis, mild pancreatic and peripancreatic edema, esophagus and gastric varices EGD removal of biliary stent 04/21/2022 Placement  of right external biliary drain 04/21/2022 Placement of new right external/internal biliary drain 04/26/2022 06/30/2022-right biliary drain exchange, no option for draining the left-sided system   10.  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  Post ERCP pancreatitis March 2024  12.  Admission 06/29/2022 with failure to thrive and persistent hyperbilirubinemia 13.  Admission 07/17/2022 with failure to thrive and persistent hyperbilirubinemia, renal failure   Samantha Lutz has metastatic rectal cancer.  She has persistent hyperbilirubinemia despite multiple attempts at placement of biliary drains.  The hyperbilirubinemia appears to be related to a central liver mass.  She has been treated with multiple systemic therapies.  She is not a candidate for further chemotherapy due to the hyperbilirubinemia and her performance status.  He has new onset ovation of creatinine, likely secondary to dehydration.  I recommend hospice care.  We have discussed this in the past.  She has declined hospice prior to this hospital admission.  She would like to return home,  but she is not sure whether her daughters can stay with her.  She needs 24-hour care at home with family/hospice versus nursing facility placement.  I will plan to serve as the primary provider with hospice.  We discussed CODE STATUS.  She could not make a decision on CODE STATUS this morning.  Recommendations:  [facility] referral for home hospice care versus nursing facility placement with hospice Continue discussions regarding CODE STATUS Intravenous hydration while she is in the hospital        LOS: 0 days   Thornton Papas, MD   07/18/2022, 6:37 AM

## 2022-07-18 NOTE — Plan of Care (Signed)

## 2022-07-18 NOTE — Evaluation (Signed)
Physical Therapy Evaluation Patient Details Name: Samantha Lutz MRN: [id number] DOB: July 05, 1950 Today's Date: 07/18/2022  History of Present Illness  72 y.o. female who presented to [facility] from home after a mechanical fall with complaints of mid right-sided back pain  and abdominal pain and admitted 07/17/22 for acute kidney injury (AKI).  Past medical history significant for metastatic rectal cancer followed by Dr. Truett Perna, multifocal pulmonary metastasis with trace right pleural effusion, hypothyroidism, history of mild thrombocytopenia secondary to chemotherapy and radiation, gastroesophageal varices, history of obstructive jaundice secondary to central liver mass status post biliary stent placement and removal, esophageal and gastric varices status postplacement of right external biliary drain 04/21/2022, placement of new right external/internal biliary drain 04/26/2022, persistent hyperbilirubinemia secondary to a central obstructing liver lesion (despite right biliary drain in place, has not relieved hyperbilirubinemia). Pt family indicated hone in the biliary drain, drain changed, pt presents with Jaundice, abn labs and hepatobilary dysfunction with no recent changes on abdominal CT. Pt PMH includes but is not limited to:  esophageal varices, obstructive juandice due to liver mass, arthritis, pleural effusion, hypothyroidism  Clinical Impression   Pt admitted with above diagnosis.  Pt currently with functional limitations due to the deficits listed below (see PT Problem List). Pt in bed when PT arrived. Pt indicated no pain and required encouragement to get OOB. During the course of the evaluation MD and nursing staff arrived. Per MD pt to have hospice consultation.  Pt required mod A for supine to sit with HOB elevated, max A to scoot to EOB, F static sitting balance EOB with 1 UE support and B LE. Pt required min A for STS from EOB, recliner and commode. Pt required A for hygiene tasks. Gait  tolerance limited to fatigue with 18 and 15 feet in personal room, pt required min A to navigate RW and for obstacle avoidance. Pt left seated in recliner and all needs in place with nursing staff aware. Pt will benefit from acute skilled PT to increase their independence and safety with mobility to allow discharge.          Recommendations for follow up therapy are one component of a multi-disciplinary discharge planning process, led by the attending physician.  Recommendations may be updated based on patient status, additional functional criteria and insurance authorization.  Follow Up Recommendations       Assistance Recommended at Discharge Frequent or constant Supervision/Assistance  Patient can return home with the following  Assistance with cooking/housework;Help with stairs or ramp for entrance;A little help with walking and/or transfers;A lot of help with bathing/dressing/bathroom;Assist for transportation    Equipment Recommendations None recommended by PT (pt reports DME in home setting)  Recommendations for Other Services       Functional Status Assessment Patient has had a recent decline in their functional status and demonstrates the ability to make significant improvements in function in a reasonable and predictable amount of time.     Precautions / Restrictions Precautions Precautions: Fall Precaution Comments: right biliary drains and port a cath Restrictions Weight Bearing Restrictions: No      Mobility  Bed Mobility Overal bed mobility: Needs Assistance Bed Mobility: Supine to Sit, Sit to Supine     Supine to sit: HOB elevated, Min assist Sit to supine: Mod assist   General bed mobility comments: increased time, cues, use of hospital bed, max A to scoot in sitting to EOB    Transfers Overall transfer level: Needs assistance Equipment used: Rolling walker (2  wheels) Transfers: Sit to/from Stand Sit to Stand: Min assist           General transfer  comment: cues for UE placement    Ambulation/Gait Ambulation/Gait assistance: Min assist Gait Distance (Feet): 18 Feet Assistive device: Rolling walker (2 wheels) Gait Pattern/deviations: Step-through pattern, Decreased stride length Gait velocity: decr     General Gait Details: cues for RW positioning, proper distance from RW,  posture;  Stairs            Wheelchair Mobility    Modified Rankin (Stroke Patients Only)       Balance Overall balance assessment: History of Falls, Needs assistance Sitting-balance support: Feet supported Sitting balance-Leahy Scale: Fair     Standing balance support: Bilateral upper extremity supported, During functional activity, Reliant on assistive device for balance Standing balance-Leahy Scale: Poor Standing balance comment: min guard with RW                             Pertinent Vitals/Pain Pain Assessment Pain Assessment: No/denies pain    Home Living Family/patient expects to be discharged to:: Private residence Living Arrangements: Alone Available Help at Discharge: Family;Available PRN/intermittently Type of Home: House Home Access: Level entry;Stairs to enter Entrance Stairs-Rails: Left;Right Entrance Stairs-Number of Steps: 4   Home Layout: One level Home Equipment: Agricultural consultant (2 wheels) Additional Comments: her daughters stop by daily, however she stated they would not be able to stay with her around the clock    Prior Function Prior Level of Function : Independent/Modified Independent             Mobility Comments: She reported very recently starting to use a RW for ambulation. She reported 1 fall at home. ADLs Comments: The pt reported being modified independent to independent with ADLs pt stated sometimes needs assist for hygine s/p voiding bowels She does not drive anymore.     Hand Dominance   Dominant Hand: Right    Extremity/Trunk Assessment   Upper Extremity Assessment Upper  Extremity Assessment: Generalized weakness (BUE AROM WFL. BUE grip strength 4/5)    Lower Extremity Assessment Lower Extremity Assessment: Generalized weakness (hx of peripheral neuropathy)       Communication   Communication: No difficulties  Cognition Arousal/Alertness: Awake/alert Behavior During Therapy: WFL for tasks assessed/performed Overall Cognitive Status: No family/caregiver present to determine baseline cognitive functioning                                 General Comments: Oriented to person and month. Disoriented to year, which she reported to be "2018." She appeared to be with slightly delayed initiation. She also appeared to be with mild cognitive processing deficit. Able to follow simple commands consistently        General Comments      Exercises     Assessment/Plan    PT Assessment Patient needs continued PT services  PT Problem List Decreased strength;Decreased activity tolerance;Decreased mobility;Decreased balance;Decreased knowledge of use of DME       PT Treatment Interventions DME instruction;Gait training;Balance training;Therapeutic exercise;Functional mobility training;Therapeutic activities;Patient/family education    PT Goals (Current goals can be found in the Care Plan section)  Acute Rehab PT Goals Patient Stated Goal: to move better PT Goal Formulation: With patient Time For Goal Achievement: 08/01/22 Potential to Achieve Goals: Fair    Frequency Min 1X/week  Co-evaluation               AM-PAC PT "6 Clicks" Mobility  Outcome Measure Help needed turning from your back to your side while in a flat bed without using bedrails?: A Little Help needed moving from lying on your back to sitting on the side of a flat bed without using bedrails?: A Little Help needed moving to and from a bed to a chair (including a wheelchair)?: A Little Help needed standing up from a chair using your arms (e.g., wheelchair or bedside  chair)?: A Little Help needed to walk in hospital room?: A Little Help needed climbing 3-5 steps with a railing? : Total 6 Click Score: 16    End of Session Equipment Utilized During Treatment: Gait belt Activity Tolerance: Patient limited by fatigue Patient left: in chair;with call bell/phone within reach Nurse Communication: Mobility status PT Visit Diagnosis: Difficulty in walking, not elsewhere classified (R26.2)    Time: 1610-9604 PT Time Calculation (min) (ACUTE ONLY): 34 min   Charges:   PT Evaluation $PT Eval Moderate Complexity: 1 Mod PT Treatments $Gait Training: 8-22 mins        Johnny Bridge, PT Acute Rehab   Jacqualyn Posey 07/18/2022, 12:08 PM

## 2022-07-18 NOTE — Evaluation (Signed)
Occupational Therapy Evaluation Patient Details Name: Samantha Lutz MRN: [id number] DOB: 1950-12-30 Today's Date: 07/18/2022   History of Present Illness Samantha Lutz is a 72 yr old female with a history of metastatic rectal CA with multi-focal pulmonary metastasis. She was admitted to the hospital 07-17-22  with failure to thrive, dehydration, and acute renal failure. PMH: esophageal varices, obstructive juandice due to liver mass, arthritis, pleural effusion, hypothyroidism   Clinical Impression   The pt is currently presenting below her baseline level of functioning for self-care management, given deconditioning, fatigue with activity, compromised endurance, and reports of increased weakness and feelings of heaviness in her BLE. The pt required assist for bed mobility, sit to stand, lower body dressing, and toileting at bathroom level. She was noted to have some difficulty fully lifting and advancing her BLE, during out of bed activity, including ambulation, which the pt attributed to weakness.  She also required increased time and effort for most progressive activity. Without further OT services, she is at risk for further weakness and deconditioning, as well as restricted ADL participation. OT anticipates the pt would have difficulty managing her self-care & daily activities should she return home at discharge, therefore short-term SNF rehab may be warranted.      Recommendations for follow up therapy are one component of a multi-disciplinary discharge planning process, led by the attending physician.  Recommendations may be updated based on patient status, additional functional criteria and insurance authorization.   Assistance Recommended at Discharge Frequent or constant Supervision/Assistance  Patient can return home with the following Assistance with cooking/housework;Assist for transportation;Help with stairs or ramp for entrance;A little help with walking and/or transfers;A lot of help  with bathing/dressing/bathroom    Functional Status Assessment  Patient has had a recent decline in their functional status and demonstrates the ability to make significant improvements in function in a reasonable and predictable amount of time.  Equipment Recommendations  BSC/3in1       Precautions / Restrictions Precautions Precautions: Fall Restrictions Weight Bearing Restrictions: No      Mobility Bed Mobility Overal bed mobility: Needs Assistance       Supine to sit: HOB elevated, Min assist Sit to supine: Mod assist   General bed mobility comments: She required moderately increased time and effort, as well as assist for trunk when performing supine to sit. She needed assist for BLE, in order to perform sit to supine.    Transfers Overall transfer level: Needs assistance Equipment used: Rolling walker (2 wheels) Transfers: Sit to/from Stand Sit to Stand: Min assist           Balance       Sitting balance - Comments: static sitting-good. dynamic sitting-fair+     Standing balance-Leahy Scale: Poor           ADL either performed or assessed with clinical judgement   ADL Overall ADL's : Needs assistance/impaired Eating/Feeding: Set up;Sitting Eating/Feeding Details (indicate cue type and reason): based on clinical judgement Grooming: Set up;Supervision/safety Grooming Details (indicate cue type and reason): She performed hand washing in the semi-fowler's position.         Upper Body Dressing : Minimal assistance;Bed level Upper Body Dressing Details (indicate cue type and reason): She doffed a hospital gown, then donned another one at bed level. Lower Body Dressing: Maximal assistance Lower Body Dressing Details (indicate cue type and reason): She required significant assist to doff & donn her socks seated EOB. Toilet Transfer: Minimal assistance;Regular Toilet;Grab bars;Rolling walker (2  wheels);Ambulation Toilet Transfer Details (indicate cue type  and reason): She required verbal cues for walker placement and use of grab bar as needed. Toileting- Clothing Manipulation and Hygiene: Minimal assistance;Sit to/from stand Toileting - Clothing Manipulation Details (indicate cue type and reason): She performed seated hygiene with SBA. She required steadying assist in standing and assist for clothing management.             Vision   Additional Comments: She correctly read the time depicted on the wall clock.            Pertinent Vitals/Pain Pain Assessment Pain Assessment: No/denies pain     Hand Dominance Right   Extremity/Trunk Assessment Upper Extremity Assessment Upper Extremity Assessment: Generalized weakness (BUE AROM WFL. BUE grip strength 4/5)   Lower Extremity Assessment Lower Extremity Assessment: Generalized weakness       Communication Communication Communication: No difficulties   Cognition Arousal/Alertness: Awake/alert   Overall Cognitive Status: No family/caregiver present to determine baseline cognitive functioning      General Comments: Oriented to person and month. Disoriented to year, which she reported to be "2018." She appeared to be with slightly delayed initiation. She also appeared to be with mild cognitive processing deficit. Able to follow simple commands consistently                Home Living Family/patient expects to be discharged to:: Private residence Living Arrangements: Alone Available Help at Discharge: Family;Available PRN/intermittently Type of Home: House       Home Layout: One level     Bathroom Shower/Tub: Walk-in shower         Home Equipment: Agricultural consultant (2 wheels)   Additional Comments: her daughters stop by daily, however she stated they would not be able to stay with her around the clock      Prior Functioning/Environment Prior Level of Function : Independent/Modified Independent             Mobility Comments: She reported very recently starting  to use a RW for ambulation. She reported 1 fall at home. ADLs Comments: The pt reported being modified independent to independent with ADLs. She does not drive anymore.        OT Problem List: Decreased strength;Decreased activity tolerance;Impaired balance (sitting and/or standing);Decreased cognition      OT Treatment/Interventions: Self-care/ADL training;Therapeutic exercise;Balance training;Therapeutic activities;Energy conservation;DME and/or AE instruction;Patient/family education;Cognitive remediation/compensation    OT Goals(Current goals can be found in the care plan section) Acute Rehab OT Goals Patient Stated Goal: to get stronger OT Goal Formulation: With patient Time For Goal Achievement: 08/01/22 Potential to Achieve Goals: Good ADL Goals Pt Will Perform Grooming: with supervision;standing Pt Will Perform Upper Body Dressing: with set-up;sitting Pt Will Perform Lower Body Dressing: with supervision;sit to/from stand Pt Will Transfer to Toilet: with supervision;ambulating Pt Will Perform Toileting - Clothing Manipulation and hygiene: with supervision;sit to/from stand  OT Frequency: Min 1X/week       AM-PAC OT "6 Clicks" Daily Activity     Outcome Measure Help from another person eating meals?: A Little Help from another person taking care of personal grooming?: A Little Help from another person toileting, which includes using toliet, bedpan, or urinal?: A Little Help from another person bathing (including washing, rinsing, drying)?: A Lot Help from another person to put on and taking off regular upper body clothing?: A Little Help from another person to put on and taking off regular lower body clothing?: A Lot 6 Click Score: 16  End of Session Equipment Utilized During Treatment: Rolling walker (2 wheels);Gait belt Nurse Communication: Mobility status  Activity Tolerance: Patient limited by fatigue Patient left: in bed;with call bell/phone within reach;with bed  alarm set  OT Visit Diagnosis: Unsteadiness on feet (R26.81);Muscle weakness (generalized) (M62.81)                Time: 1610-9604 OT Time Calculation (min): 42 min Charges:  OT General Charges $OT Visit: 1 Visit OT Evaluation $OT Eval Moderate Complexity: 1 Mod OT Treatments $Self Care/Home Management : 8-22 mins $Therapeutic Activity: 8-22 mins    Reuben Likes, OTR/L 07/18/2022, 11:08 AM

## 2022-07-18 NOTE — TOC Initial Note (Signed)
Transition of Care [facility]) - Initial/Assessment Note    Patient Details  Name: Samantha Lutz MRN: [id number] Date of Birth: 04-23-1950  Transition of Care [facility]) CM/SW Contact:    Otelia Santee, LCSW Phone Number: 07/18/2022, 2:37 PM  Clinical Narrative:                 Pt recommended for home with hospice care. Pt agreeable to this. Pt requests to have disposition discussed with daughter. CSW left voicemail for Corie Chiquito and currently awaiting return call.   Expected Discharge Plan: Home w Hospice Care Barriers to Discharge: No Barriers Identified   Patient Goals and CMS Choice Patient states their goals for this hospitalization and ongoing recovery are:: To return home with hospice CMS Medicare.gov Compare Post Acute Care list provided to:: Patient Choice offered to / list presented to : Patient [place] ownership interest in [facility].provided to:: Patient    Expected Discharge Plan and Services In-house Referral: Hospice / Palliative Care, Clinical Social Work Discharge Planning Services: NA Post Acute Care Choice: Hospice Living arrangements for the past 2 months: Single Family Home                 DME Arranged: N/A DME Agency: NA                  Prior Living Arrangements/Services Living arrangements for the past 2 months: Single Family Home Lives with:: Adult Children Patient language and need for interpreter reviewed:: Yes Do you feel safe going back to the place where you live?: Yes      Need for Family Participation in Patient Care: Yes (Comment) Care giver support system in place?: Yes (comment) Current home services: DME [facility]) Criminal Activity/Legal Involvement Pertinent to Current Situation/Hospitalization: No - Comment as needed  Activities of Daily Living Home Assistive Devices/Equipment: None ADL Screening (condition at time of admission) Patient's cognitive ability adequate to safely complete daily activities?: Yes Is  the patient deaf or have difficulty hearing?: No Does the patient have difficulty seeing, even when wearing glasses/contacts?: No Does the patient have difficulty concentrating, remembering, or making decisions?: No Patient able to express need for assistance with ADLs?: Yes Does the patient have difficulty dressing or bathing?: No Independently performs ADLs?: Yes (appropriate for developmental age) Does the patient have difficulty walking or climbing stairs?: No Weakness of Legs: Both Weakness of Arms/Hands: Both  Permission Sought/Granted Permission sought to share information with : Family Supports Permission granted to share information with : Yes, Verbal Permission Granted  Share Information with NAME: Corie Chiquito     Permission granted to share info w Relationship: Daughter  Permission granted to share info w Contact Information: [phone]  Emotional Assessment Appearance:: Appears stated age Attitude/Demeanor/Rapport: Unable to Assess Affect (typically observed): Unable to Assess Orientation: : Oriented to Self, Oriented to Place, Oriented to  Time, Oriented to Situation Alcohol / Substance Use: Not Applicable Psych Involvement: No (comment)  Admission diagnosis:  Hyperbilirubinemia [E80.6] Weakness [R53.1] AKI (acute kidney injury) (HCC) [N17.9] Metastatic cancer (HCC) [C79.9] Patient Active Problem List   Diagnosis Date Noted   Malnutrition of moderate degree 07/18/2022   Metastatic cancer (HCC) 07/18/2022   AKI (acute kidney injury) (HCC) 06/30/2022   Disorder of bile duct stent 04/21/2022   Biliary obstruction 04/17/2022   Port-A-Cath in place 11/25/2019   Goals of care, counseling/discussion 10/02/2019   Status post ileostomy takedown 07/10/2018 07/10/2018   Rectal cancer [id number] (1/23 LN) s/p neoadj chemoXRT, robotic  LAR resection & diverting loop ileostomy 12/21/2017 09/04/2017   Hypothyroid 01/16/2013   Right knee pain    Elevated LFTs 01/03/2011    Hypertension 12/19/2010   Morbid obesity with body mass index of 40.0-49.9 (HCC)    PCP:  Deborah Chalk, FNP Pharmacy:   [facility] [address] [place] - [address] [place] [address] Phone: [phone] Fax: [phone]     Social Determinants of Health (SDOH) Social History: SDOH Screenings   Food Insecurity: No Food Insecurity (07/17/2022)  Housing: Patient Declined (07/17/2022)  Transportation Needs: No Transportation Needs (07/17/2022)  Utilities: Not At Risk (07/17/2022)  Depression (PHQ2-9): Low Risk  (11/01/2018)  Financial Resource Strain: Low Risk  (11/01/2021)  Social Connections: Socially Isolated (11/01/2021)  Tobacco Use: Low Risk  (07/17/2022)   SDOH Interventions:     Readmission Risk Interventions    06/30/2022   11:34 AM  Readmission Risk Prevention Plan  Transportation Screening Complete  PCP or Specialist Appt within 5-7 Days Complete  Home Care Screening Complete  Medication Review (RN CM) Complete

## 2022-07-18 NOTE — Progress Notes (Signed)
PROGRESS NOTE    Samantha Lutz  ZOX:[id number] DOB: [birth year] DOA: 07/17/2022 PCP: Deborah Chalk, FNP  Outpatient Specialists:     Brief Narrative:  Patient is a 72 year old female with metastatic rectal cancer, hypothyroidism, esophageal varices, and obstructive jaundice secondary to central liver mass, s/p biliary stenting.  Patient was admitted with failure to thrive.  Input from the oncology team and palliative care team is appreciated.  Plan is to discharge patient home with home hospice.  07/18/2022: Patient seen alongside patient's nurse.  Patient remains very weak.  Patient remains very jaundiced.  No fever or chills.  No nausea or vomiting endorsed.  Worsening bilirubin is noted.   Assessment & Plan:   Principal Problem:   AKI (acute kidney injury) (HCC) Active Problems:   Malnutrition of moderate degree   Metastatic cancer (HCC)   High risk medication use   Cancer associated pain   Need for emotional support   Counseling and coordination of care   Palliative care encounter   Acute renal failure: -Suspect prerenal.  Cannot rule out combined prerenal and ATN. -Serum creatinine has improved from 2.7-2.55. -Continue IV fluids. -Continue to monitor renal function and electrolytes closely. -Avoid nephrotoxins as able -Dose all medications, considering impaired renal function. -Keep MAP greater than 65 mmHg.   Chronically elevated bilirubin: -Due to central liver obstruction from liver mass, continue biliary drain.   -CT scan today without acute findings.   -.  Recommendations noted.   Anemia of chronic disease-stable   Chronic thrombocytopenia-improved from previous   Failure to thrive-continue Ensure, RD consult, PT/OT   Hypothyroidism-Synthroid 50 mcg p.o. daily   Widely metastatic rectal cancer: -Oncology input is appreciated. -Palliative care input is appreciated. -Patient will be discharged home with home hospice. -Input from [facility] team is  appreciated.   DVT prophylaxis: Subcutaneous Lovenox. Code Status: Full code.  CODE STATUS is being addressed. Family Communication:  Disposition Plan: Home with home hospice.   Consultants:  Oncology. Palliative care team.  Procedures:  None.  Antimicrobials:  None.   Subjective: Patient is weak.  Objective: Vitals:   07/17/22 1900 07/17/22 2057 07/18/22 0328 07/18/22 1402  BP:  (!) 96/49 (!) 115/53 (!) 117/49  Pulse:  70 77 73  Resp:  16 18 16   Temp:  97.8 F (36.6 C) 97.9 F (36.6 C)   TempSrc:  Oral    SpO2:  100% 100% 100%  Weight: 81.1 kg     Height: 5\' 2"  (1.575 m)       Intake/Output Summary (Last 24 hours) at 07/18/2022 1627 Last data filed at 07/18/2022 0300 Gross per 24 hour  Intake 1543.67 ml  Output 50 ml  Net 1493.67 ml   Filed Weights   07/17/22 1900  Weight: 81.1 kg    Examination:  General exam: Appears calm and comfortable, jaundiced and weak looking. Respiratory system: Clear to auscultation. . Cardiovascular system: S1 & S2 heard.   Gastrointestinal system: Abdomen is obese, soft and nontender.   Central nervous system: Awake and alert.  Patient moves all extremities.  Extremities: No leg edema.  Data Reviewed: I have personally reviewed following labs and imaging studies  CBC: Recent Labs  Lab 07/17/22 1123 07/18/22 0331  WBC 5.6 5.0  NEUTROABS 4.3  --   HGB 10.2* 9.9*  HCT 28.9* 28.6*  MCV 98.3 97.9  PLT 113* 107*   Basic Metabolic Panel: Recent Labs  Lab 07/17/22 1123 07/18/22 0331  NA 133* 133*  K 3.4* 3.8  CL 106 106  CO2 12* 15*  GLUCOSE 99 83  BUN 58* 60*  CREATININE 2.70* 2.55*  CALCIUM 7.7* 8.1*   GFR: Estimated Creatinine Clearance: 19.4 mL/min (A) (by C-G formula based on SCr of 2.55 mg/dL (H)). Liver Function Tests: Recent Labs  Lab 07/17/22 1123 07/18/22 0331  AST 93* 103*  ALT 38 41  ALKPHOS 124 122  BILITOT 21.5* 30.7*  PROT 5.4* 5.4*  ALBUMIN 2.1* 2.0*   Recent Labs  Lab  07/17/22 1123  LIPASE 31   No results for input(s): "AMMONIA" in the last 168 hours. Coagulation Profile: No results for input(s): "INR", "PROTIME" in the last 168 hours. Cardiac Enzymes: No results for input(s): "CKTOTAL", "CKMB", "CKMBINDEX", "TROPONINI" in the last 168 hours. BNP (last 3 results) No results for input(s): "PROBNP" in the last 8760 hours. HbA1C: No results for input(s): "HGBA1C" in the last 72 hours. CBG: No results for input(s): "GLUCAP" in the last 168 hours. Lipid Profile: No results for input(s): "CHOL", "HDL", "LDLCALC", "TRIG", "CHOLHDL", "LDLDIRECT" in the last 72 hours. Thyroid Function Tests: No results for input(s): "TSH", "T4TOTAL", "FREET4", "T3FREE", "THYROIDAB" in the last 72 hours. Anemia Panel: No results for input(s): "VITAMINB12", "FOLATE", "FERRITIN", "TIBC", "IRON", "RETICCTPCT" in the last 72 hours. Urine analysis:    Component Value Date/Time   COLORURINE YELLOW 01/04/2015 1123   APPEARANCEUR CLEAR 01/04/2015 1123   LABSPEC 1.025 01/04/2015 1123   PHURINE 5.0 01/04/2015 1123   GLUCOSEU NEGATIVE 01/04/2015 1123   HGBUR NEGATIVE 01/04/2015 1123   BILIRUBINUR NEGATIVE 01/04/2015 1123   KETONESUR NEGATIVE 01/04/2015 1123   UROBILINOGEN 0.2 01/04/2015 1123   NITRITE NEGATIVE 01/04/2015 1123   LEUKOCYTESUR NEGATIVE 01/04/2015 1123   Sepsis Labs: @LABRCNTIP (procalcitonin:4,lacticidven:4)  )No results found for this or any previous visit (from the past 240 hour(s)).       Radiology Studies: CT ABDOMEN PELVIS WO CONTRAST  Result Date: 07/17/2022 CLINICAL DATA:  History of metastatic rectal cancer with biliary obstruction status post stent placement presenting with 2 weeks of generalized weakness. * Tracking Code: BO * EXAM: CT ABDOMEN AND PELVIS WITHOUT CONTRAST TECHNIQUE: Multidetector CT imaging of the abdomen and pelvis was performed following the standard protocol without IV contrast. RADIATION DOSE REDUCTION: This exam was performed  according to the departmental dose-optimization program which includes automated exposure control, adjustment of the mA and/or kV according to patient size and/or use of iterative reconstruction technique. COMPARISON:  CT abdomen and pelvis dated 05/25/2022 FINDINGS: Lower chest: Partially imaged central venous catheter tip terminates in the right atrium. Similar right lower lobe mass. No pleural effusion or pneumothorax demonstrated. Partially imaged heart size is normal. Hepatobiliary: No focal hepatic lesions. Internal external biliary drain again seen with distal aspect of stent now extending to the third portion of the duodenum. No intra or extrahepatic biliary ductal dilation. Cholelithiasis. Pancreas: No focal lesions or main ductal dilation. Spleen: Normal in size without focal abnormality. Adrenals/Urinary Tract: No adrenal nodules. No suspicious renal mass or hydronephrosis. Punctate nonobstructing bilateral renal stones. Trace nondependent gas within the urinary bladder. Stomach/Bowel: Normal appearance of the stomach. Postsurgical changes of the rectum with similar presacral soft tissue thickening/edema. Previously noted anal rectal wall thickening is suboptimally evaluated due to underdistention. Appendix is not discretely seen. Vascular/Lymphatic: Aortic atherosclerosis. Paraesophageal varices. No enlarged abdominal or pelvic lymph nodes. Reproductive: No adnexal masses. Other: No free fluid, fluid collection, or free air. Musculoskeletal: No acute or abnormal lytic or blastic osseous lesions. Multilevel degenerative changes of the partially imaged  thoracic and lumbar spine. IMPRESSION: 1. Internal external biliary drain again seen with distal aspect of stent now extending to the third portion of the duodenum. No intra or extrahepatic biliary ductal dilation. 2. Similar right lower lobe lung mass. 3. Postsurgical changes of the rectum with similar presacral soft tissue thickening/edema. Previously  noted anorectal wall thickening is suboptimally evaluated due to underdistention. 4. Trace nondependent gas within the urinary bladder. Recommend correlation with history of recent instrumentation and urinalysis. 5.  Aortic Atherosclerosis (ICD10-I70.0). Electronically Signed   By: Agustin Cree M.D.   On: 07/17/2022 15:18        Scheduled Meds:  Chlorhexidine Gluconate Cloth  6 each Topical Daily   enoxaparin (LOVENOX) injection  30 mg Subcutaneous Q24H   lactose free nutrition  237 mL Oral TID WC   levothyroxine  50 mcg Oral Q0600   sodium chloride flush  10-40 mL Intracatheter Q12H   Continuous Infusions:  sodium chloride 100 mL/hr at 07/18/22 0057     LOS: 0 days    Time spent: 35 minutes.    Berton Mount, MD  [facility] Pager #: [phone] 7PM-7AM contact night coverage as above

## 2022-07-19 ENCOUNTER — Inpatient Hospital Stay (HOSPITAL_COMMUNITY): Payer: Medicare Other

## 2022-07-19 DIAGNOSIS — Z66 Do not resuscitate: Secondary | ICD-10-CM

## 2022-07-19 DIAGNOSIS — R7989 Other specified abnormal findings of blood chemistry: Secondary | ICD-10-CM

## 2022-07-19 DIAGNOSIS — I4891 Unspecified atrial fibrillation: Secondary | ICD-10-CM

## 2022-07-19 DIAGNOSIS — Z711 Person with feared health complaint in whom no diagnosis is made: Secondary | ICD-10-CM

## 2022-07-19 DIAGNOSIS — N179 Acute kidney failure, unspecified: Secondary | ICD-10-CM | POA: Diagnosis not present

## 2022-07-19 LAB — COMPREHENSIVE METABOLIC PANEL
ALT: 41 U/L (ref 0–44)
ALT: 39 U/L (ref 0–44)
AST: 92 U/L — ABNORMAL HIGH (ref 15–41)
Albumin: 1.9 g/dL — ABNORMAL LOW (ref 3.5–5.0)
Alkaline Phosphatase: 116 U/L (ref 38–126)
Anion gap: 12 (ref 5–15)
Anion gap: 11 (ref 5–15)
BUN: 60 mg/dL — ABNORMAL HIGH (ref 8–23)
BUN: 52 mg/dL — ABNORMAL HIGH (ref 8–23)
CO2: 13 mmol/L — ABNORMAL LOW (ref 22–32)
Calcium: 7.7 mg/dL — ABNORMAL LOW (ref 8.9–10.3)
Chloride: 110 mmol/L (ref 98–111)
Creatinine, Ser: 2.55 mg/dL — ABNORMAL HIGH (ref 0.44–1.00)
Creatinine, Ser: 2.14 mg/dL — ABNORMAL HIGH (ref 0.44–1.00)
GFR, Estimated: 19 mL/min — ABNORMAL LOW (ref >60)
GFR, Estimated: 24 mL/min — ABNORMAL LOW (ref >60)
Glucose, Bld: 83 mg/dL (ref 70–99)
Glucose, Bld: 90 mg/dL (ref 70–99)
Potassium: 3.8 mmol/L (ref 3.5–5.1)
Potassium: 3.2 mmol/L — ABNORMAL LOW (ref 3.5–5.1)
Sodium: 134 mmol/L — ABNORMAL LOW (ref 135–145)
Total Bilirubin: 26.1 mg/dL (ref 0.3–1.2)
Total Protein: 5.4 g/dL — ABNORMAL LOW (ref 6.5–8.1)
Total Protein: 5 g/dL — ABNORMAL LOW (ref 6.5–8.1)

## 2022-07-19 LAB — CBC
HCT: 25.8 % — ABNORMAL LOW (ref 36.0–46.0)
Hemoglobin: 9 g/dL — ABNORMAL LOW (ref 12.0–15.0)
MCH: 34.7 pg — ABNORMAL HIGH (ref 26.0–34.0)
MCHC: 34.9 g/dL (ref 30.0–36.0)
MCV: 99.6 fL (ref 80.0–100.0)
Platelets: 111 10*3/uL — ABNORMAL LOW (ref 150–400)
RBC: 2.59 MIL/uL — ABNORMAL LOW (ref 3.87–5.11)
RDW: 16.5 % — ABNORMAL HIGH (ref 11.5–15.5)
WBC: 5.5 10*3/uL (ref 4.0–10.5)
nRBC: 0 % (ref 0.0–0.2)

## 2022-07-19 LAB — URINALYSIS, ROUTINE W REFLEX MICROSCOPIC
Glucose, UA: NEGATIVE mg/dL
Ketones, ur: NEGATIVE mg/dL
Nitrite: POSITIVE — AB
Protein, ur: NEGATIVE mg/dL
Specific Gravity, Urine: 1.013 (ref 1.005–1.030)
WBC, UA: >50 WBC/hpf (ref 0–5)
pH: 5 (ref 5.0–8.0)

## 2022-07-19 LAB — TROPONIN I (HIGH SENSITIVITY): Troponin I (High Sensitivity): 1075 ng/L (ref <18)

## 2022-07-19 LAB — CORTISOL: Cortisol, Plasma: 35 ug/dL

## 2022-07-19 LAB — MAGNESIUM: Magnesium: 1.6 mg/dL — ABNORMAL LOW (ref 1.7–2.4)

## 2022-07-19 LAB — PHOSPHORUS: Phosphorus: 3.4 mg/dL (ref 2.5–4.6)

## 2022-07-19 LAB — TSH: TSH: 2.553 u[IU]/mL (ref 0.350–4.500)

## 2022-07-19 MED ORDER — ALUM & MAG HYDROXIDE-SIMETH 200-200-20 MG/5ML PO SUSP
30.0000 mL | Freq: Four times a day (QID) | ORAL | Status: DC | PRN
  Administered 2022-07-19: 30 mL via ORAL
  Filled 2022-07-19: qty 30

## 2022-07-19 MED ORDER — MAGNESIUM SULFATE 2 GM/50ML IV SOLN: 2.0000 g | Freq: Once | INTRAVENOUS | Status: DC

## 2022-07-19 MED ORDER — ORAL CARE MOUTH RINSE: 15.0000 mL | OROMUCOSAL | Status: DC | PRN

## 2022-07-19 MED ORDER — SODIUM CHLORIDE 0.9 % IV BOLUS
1000.0000 mL | Freq: Once | INTRAVENOUS | Status: AC
  Administered 2022-07-19: 1000 mL via INTRAVENOUS

## 2022-07-19 MED ORDER — ALBUMIN HUMAN 25 % IV SOLN
50.0000 g | Freq: Once | INTRAVENOUS | Status: DC
  Filled 2022-07-19: qty 200

## 2022-07-19 MED ORDER — HALOPERIDOL LACTATE 5 MG/ML IJ SOLN: 1.0000 mg | INTRAMUSCULAR | Status: DC | PRN

## 2022-07-19 MED ORDER — FENTANYL CITRATE PF 50 MCG/ML IJ SOSY
12.5000 ug | PREFILLED_SYRINGE | Freq: Once | INTRAMUSCULAR | Status: AC
  Administered 2022-07-19: 12.5 ug via INTRAVENOUS
  Filled 2022-07-19: qty 1

## 2022-07-19 MED ORDER — MIDODRINE HCL 5 MG PO TABS
5.0000 mg | ORAL_TABLET | ORAL | Status: AC
  Administered 2022-07-19: 5 mg via ORAL
  Filled 2022-07-19: qty 1

## 2022-07-19 MED ORDER — LORAZEPAM 2 MG/ML IJ SOLN: 0.5000 mg | INTRAMUSCULAR | Status: DC | PRN

## 2022-07-19 MED ORDER — POTASSIUM CHLORIDE CRYS ER 20 MEQ PO TBCR
40.0000 meq | EXTENDED_RELEASE_TABLET | Freq: Once | ORAL | Status: AC
  Administered 2022-07-19: 40 meq via ORAL
  Filled 2022-07-19: qty 2

## 2022-07-19 MED ORDER — GLYCOPYRROLATE 0.2 MG/ML IJ SOLN: 0.2000 mg | INTRAMUSCULAR | Status: DC | PRN

## 2022-07-19 MED ORDER — METOPROLOL TARTRATE 5 MG/5ML IV SOLN: 2.5000 mg | INTRAVENOUS | Status: DC | PRN

## 2022-07-19 MED ORDER — POTASSIUM CHLORIDE CRYS ER 20 MEQ PO TBCR: 40.0000 meq | EXTENDED_RELEASE_TABLET | Freq: Two times a day (BID) | ORAL | Status: DC

## 2022-07-19 MED ORDER — ALBUMIN HUMAN 25 % IV SOLN
25.0000 g | Freq: Once | INTRAVENOUS | Status: AC
  Administered 2022-07-19: 25 g via INTRAVENOUS
  Filled 2022-07-19: qty 100

## 2022-07-19 MED ORDER — POTASSIUM CHLORIDE CRYS ER 20 MEQ PO TBCR
40.0000 meq | EXTENDED_RELEASE_TABLET | Freq: Every day | ORAL | Status: DC
  Administered 2022-07-19: 40 meq via ORAL
  Filled 2022-07-19: qty 2

## 2022-07-19 MED ORDER — BIOTENE DRY MOUTH MT LIQD
15.0000 mL | OROMUCOSAL | Status: DC | PRN
Start: 2022-07-19 — End: 2022-07-19

## 2022-07-19 MED ORDER — HYDROMORPHONE HCL 1 MG/ML IJ SOLN
0.5000 mg | INTRAMUSCULAR | Status: DC | PRN
  Administered 2022-07-19 – 2022-07-20 (×5): 0.5 mg via INTRAVENOUS
  Filled 2022-07-19: qty 1
  Filled 2022-07-19 (×3): qty 0.5
  Filled 2022-07-19: qty 1

## 2022-07-19 MED ORDER — POLYVINYL ALCOHOL 1.4 % OP SOLN: 1.0000 [drp] | Freq: Four times a day (QID) | OPHTHALMIC | Status: DC | PRN

## 2022-07-19 NOTE — Progress Notes (Signed)
    Patient Name: Samantha Lutz           DOB: [birth year]  MRN: [id number]      Admission Date: 07/17/2022  Attending Provider: Barnetta Chapel, MD  Primary Diagnosis: AKI (acute kidney injury) [facility])   Level of care: Med-Surg    CROSS COVER NOTE   Date of Service   07/19/2022   Samantha Lutz, 72 y.o. female, was admitted on 07/17/2022 for AKI (acute kidney injury) (HCC).    HPI/Events of Note   Hypotension, SBP 60's. BP checked in multiple sites.  Patient is A/O x4, asymptomatic.  EKG revealed new onset afib RVR, HR sustaining 140-160's.  Denies CP, SOB, palpitations, lightheaded, abdominal discomfort, N/V  Plan: Currently receiving 1L fluid bolus. Now SBP 100's Midodrine 5mg  ordered, can also trial IV albumin If SBP > 110, can trial lopressor IV for uncontrolled HR> 120 Replace potassium, mag, phosph as needed    0520- end of Afib, no intervention required. HR 80-90.  Patient now endorsing non radiating, pressure- like pain located mid chest, 5/10. Pain is most prominent when taking in breath. Denies heartburn, abdominal pain, reflux, palpitations. Pain cannot be reproduced with palpation.   Plan:  Troponin EKG- NSR with no ST segment changes Chest xray  Maalox, Fentanyl (low dose)     Interventions/ Plan   Bolus, 1 L Midodrine, 5 mg IV Albumin  12- lead EKG Chest xray- pending Labs Troponin- pending   Maalox, Fentanyl         Anthoney Harada, DNP, ACNPC- AG [facility]

## 2022-07-19 NOTE — Progress Notes (Signed)
PROGRESS NOTE    Samantha Lutz  ZOX:[id number] DOB: [birth year] DOA: 07/17/2022 PCP: Deborah Chalk, FNP    Brief Narrative:  72 year old female with metastatic rectal cancer, hypothyroidism, esophageal varices, and obstructive jaundice secondary to central liver mass, s/p biliary stenting.  Patient was admitted with failure to thrive, found to have acute renal failure, worsening hepatic function.   Assessment & Plan:   Principal Problem:   AKI (acute kidney injury) (HCC) Active Problems:   Malnutrition of moderate degree   Metastatic cancer (HCC)   High risk medication use   Cancer associated pain   Need for emotional support   Counseling and coordination of care   Palliative care encounter   Hyperbilirubinemia   Concern about end of life   DNR (do not resuscitate)   Elevated troponin   Acute renal failure Suspect prerenal.  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   CT scan of abdomen with persistent finding as above  Elevated troponin Possibly demand ischemia versus NSTEMI EKG with no acute ST changes Neurology consulted, appreciate recs No further interventions noted, switched to comfort care   Anemia of chronic disease   Chronic thrombocytopenia   Failure to thrive   Hypothyroidism   Widely metastatic rectal cancer Goals of care discussion Due to overall poor prognosis, palliative consulted, patient is now DNR and switched to comfort care Plan for likely residential hospice   DVT prophylaxis: Subcutaneous Lovenox. Code Status: DNR Family Communication: None at bedside Disposition Plan: Likely residential hospice   Consultants:  Oncology. Palliative care team Cardiology  Procedures:  None.  Antimicrobials:   None.   Subjective: Early this am, patient noted to drop BP to 60 systolic, was given IV bolus, midodrine with some improvement. Also noted to be in A-fib with RVR, converted back to sinus rhythm, rate controlled without any intervention.  Continues to report chest pain.  Due to worsening/poor prognosis, patient switched to comfort care with plans for residential hospice.  Objective: Vitals:   07/18/22 1402 07/18/22 2007 07/19/22 0400 07/19/22 0540  BP: (!) 117/49 108/61 109/77 (!) 112/49  Pulse: 73 78 (!) 108 92  Resp: 16 18 18    Temp:  (!) 97.5 F (36.4 C) 97.6 F (36.4 C)   TempSrc:   Oral   SpO2: 100% 100% 100%   Weight:      Height:        Intake/Output Summary (Last 24 hours) at 07/19/2022 1854 Last data filed at 07/19/2022 1032 Gross per 24 hour  Intake 1381.56 ml  Output 320 ml  Net 1061.56 ml   Filed Weights   07/17/22 1900  Weight: 81.1 kg    Examination: General: Very lethargic, unable to stay awake for conversations, severely jaundiced Cardiovascular: S1, S2 present Respiratory: Diminished breath sounds bilaterally, increased work of breathing noted Abdomen: Soft, nontender, nondistended, bowel sounds present Musculoskeletal: No bilateral pedal edema noted Skin: Jaundiced Psychiatry: Unable to assess   Data Reviewed: I have personally reviewed following labs and imaging studies  CBC: Recent Labs  Lab 07/17/22 1123 07/18/22 0331 07/19/22 0934  WBC 5.6 5.0 5.5  NEUTROABS 4.3  --   --   HGB 10.2* 9.9* 9.0*  HCT 28.9* 28.6* 25.8*  MCV 98.3 97.9 99.6  PLT 113* 107* 111*   Basic  Metabolic Panel: Recent Labs  Lab 07/17/22 1123 07/18/22 0331 07/19/22 0343 07/19/22 0934  NA 133* 133* 134*  --   K 3.4* 3.8 3.2*  --   CL 106 106 110  --   CO2 12* 15* 13*  --   GLUCOSE 99 83 90  --   BUN 58* 60* 52*  --   CREATININE 2.70* 2.55* 2.14*  --   CALCIUM 7.7* 8.1* 7.7*  --   MG  --   --   --  1.6*  PHOS  --   --   --  3.4   GFR: Estimated  Creatinine Clearance: 23.1 mL/min (A) (by C-G formula based on SCr of 2.14 mg/dL (H)). Liver Function Tests: Recent Labs  Lab 07/17/22 1123 07/18/22 0331 07/19/22 0343  AST 93* 103* 92*  ALT 38 41 39  ALKPHOS 124 122 116  BILITOT 21.5* 30.7* 26.1*  PROT 5.4* 5.4* 5.0*  ALBUMIN 2.1* 2.0* 1.9*   Recent Labs  Lab 07/17/22 1123  LIPASE 31   No results for input(s): "AMMONIA" in the last 168 hours. Coagulation Profile: No results for input(s): "INR", "PROTIME" in the last 168 hours. Cardiac Enzymes: No results for input(s): "CKTOTAL", "CKMB", "CKMBINDEX", "TROPONINI" in the last 168 hours. BNP (last 3 results) No results for input(s): "PROBNP" in the last 8760 hours. HbA1C: No results for input(s): "HGBA1C" in the last 72 hours. CBG: No results for input(s): "GLUCAP" in the last 168 hours. Lipid Profile: No results for input(s): "CHOL", "HDL", "LDLCALC", "TRIG", "CHOLHDL", "LDLDIRECT" in the last 72 hours. Thyroid Function Tests: Recent Labs    07/19/22 0934  TSH 2.553   Anemia Panel: No results for input(s): "VITAMINB12", "FOLATE", "FERRITIN", "TIBC", "IRON", "RETICCTPCT" in the last 72 hours. Urine analysis:    Component Value Date/Time   COLORURINE AMBER (A) 07/19/2022 1027   APPEARANCEUR CLOUDY (A) 07/19/2022 1027   LABSPEC 1.013 07/19/2022 1027   PHURINE 5.0 07/19/2022 1027   GLUCOSEU NEGATIVE 07/19/2022 1027   GLUCOSEU NEGATIVE 01/04/2015 1123   HGBUR LARGE (A) 07/19/2022 1027   BILIRUBINUR MODERATE (A) 07/19/2022 1027   KETONESUR NEGATIVE 07/19/2022 1027   PROTEINUR NEGATIVE 07/19/2022 1027   UROBILINOGEN 0.2 01/04/2015 1123   NITRITE POSITIVE (A) 07/19/2022 1027   LEUKOCYTESUR MODERATE (A) 07/19/2022 1027   Sepsis Labs: @LABRCNTIP (procalcitonin:4,lacticidven:4)  )No results found for this or any previous visit (from the past 240 hour(s)).       Radiology Studies: DG Chest Port 1 View  Result Date: 07/19/2022 CLINICAL DATA:  72 year old female  with history of chest pain and increasing shortness of breath. History of metastatic rectal cancer. EXAM: PORTABLE CHEST 1 VIEW COMPARISON:  Chest x-ray 06/30/2022. FINDINGS: Right internal jugular single-lumen power porta cath with tip terminating in the right atrium. Percutaneous biliary drain projecting over the right upper quadrant of the abdomen, partially imaged. Elevation of the right hemidiaphragm. Increasing opacity throughout the right mid to lower hemithorax which may reflect worsening areas of atelectasis and/or consolidation, along with increasing moderate right pleural effusion. Left lung appears clear. No pneumothorax. No evidence of pulmonary edema. Heart size is mildly enlarged. The patient is rotated to the right on today's exam, resulting in distortion of the mediastinal contours and reduced diagnostic sensitivity and specificity for mediastinal pathology. IMPRESSION: 1. Worsening aeration throughout the right mid to lower lung, which may reflect increasing atelectasis and/or consolidation in the underlying lung and enlarging moderate right pleural effusion. Metastatic disease to this region is not excluded. 2. Support  apparatus, as above. Electronically Signed   By: Trudie Reed M.D.   On: 07/19/2022 06:07        Andreas Newport, MD [facility]  7PM-7AM contact night coverage as above

## 2022-07-19 NOTE — Progress Notes (Signed)
Daily Progress Note   Patient Name: Samantha Lutz       Date: 07/19/2022 DOB: 22-Nov-1950  Age: 72 y.o. MRN#: [id number] Attending Physician: Briant Cedar, MD Primary Care Physician: Deborah Chalk, FNP Admit Date: 07/17/2022 Length of Stay: 1 day  Reason for Consultation/Follow-up: Establishing goals of care  Subjective:   CC: Patient is in pain and having increased work of breathing. Following up regarding complex medical decision making and symptom management.   Subjective:  Reviewed EMR prior to presenting to bedside.  Also discussed care with bedside RN for updates.  Patient's medical status has continued to rapidly deteriorate.  Patient hypotensive with worsening lab values.  Patient in multisystem organ failure.  Patient being transferred to ICU at family's request.  Able to visit with patient once she was transferred to the ICU.  Also able to discuss care with patient's daughter, Samantha Lutz.  Updated patient and daughter that patient is dying.  Discussed transition to comfort focused care at this time.  Patient agreeing with this as she notes she is in pain and has increased work of breathing. Patient wants to be comfortable at the end of her life. Daughter tearful though also agreeing with this.  Patient's other daughter is currently coming into the hospital to visit.  Discussed worry that with patient's deterioration, may not be able to make at home with hospice.  May need to instead consider inpatient hospice.  Will transition to comfort focused care at this time to determine patient's symptom management needs.  Provided emotional support via active listening.  All questions answered at that time.  Noted palliative medicine team will continue to follow along with patient's medical journey.  Updated care team including oncologist about transition to comfort focused care at this time.  Review of Systems Pain, shortness of breath  Objective:   Vital Signs:  BP (!) 112/49 (BP  Location: Left Leg)   Pulse 92   Temp 97.6 F (36.4 C) (Oral)   Resp 18   Ht 5\' 2"  (1.575 m)   Wt 81.1 kg   LMP  (LMP Unknown)   SpO2 100%   BMI 32.70 kg/m   Physical Exam: General: NAD, awake, chronically ill appearing Eyes: Scleral icterus HENT: dry mucous membranes Cardiovascular: Tachycardia noted Respiratory: increased work of breathing noted, not in respiratory distress Abdomen: distended Skin: Jaundiced Neuro: Awake, interactive  Imaging:  I personally reviewed recent imaging.   Assessment & Plan:   Assessment: Patient is a 72 year old female with a past medical history of metastatic rectal cancer, hypothyroidism, esophageal varices, and obstructive jaundice secondary to central liver mass with biliary stent placement who was admitted on 07/17/2022 for management of dehydration and acute renal failure. Patient had recently been hospitalized and was recommended to return home with hospice at that time. Patient followed by Dr. Truett Perna, oncology. Palliative medicine team consulted to assist with complex medical decision making.   Recommendations/Plan: # Complex medical decision making/goals of care:  # Complex medical decision making/goals of care  -Spoke with patient and her daughter Samantha Lutz at bedside regarding patient's worsening medical status. Patient is in multisystem organ failure and dying. Discussed transitioning to comfort focused care at this time with patient and family agreed with. Expressed concern patient will not be stable enough for home with hospice based on current assessment; may need to consider inpatient hospice if stable.   -At this time we will discontinue interventions that are no longer focused on comfort such as IV fluids, imaging,  or lab work.  Will instead focus on symptom management of pain, dyspnea, and agitation in the setting of end-of-life care.  - Code Status: DNR   # Symptom management    -Pain/Dyspnea, acute in the setting of end-of-life  care                Patient was not on medications for pain previously.                               -Start IV Dilaudid 0.5 mg IV every 1 hour as needed.  Continue to adjust based on patient's symptom burden.  If patient needing frequent dosing, may need to consider continuous infusion.                  -Anxiety/agitation, in the setting of end-of-life care                               -Start IV Ativan 0.5 mg every 4 hours as needed. Continue to adjust based on patient's symptom burden.                                 -Start IV Haldol 1 mg every 4 hours as needed. Continue to adjust based on patient's symptom burden.                   -Secretions, in the setting of end-of-life care                               -Start IV glycopyrrolate 0.2 mg every 4 hours as needed.   # Psychosocial Support:  -daughters  -consulted chaplain   # Discharge Planning: TBD. Transitioning to full comfort focused care at this time.   Discussed with: patient, daughters, RN, hospitalists, oncologists, TOC  Thank you for allowing the palliative care team to participate in the care Samantha Lutz.  Alvester Morin, DO Palliative Care Provider PMT # [phone]  If patient remains symptomatic despite maximum doses, please call PMT at [phone] between 0700 and 1900. Outside of these hours, please call attending, as PMT does not have night coverage.  This provider spent a total of 65 minutes providing patient's care.  Includes review of EMR, discussing care with other staff members involved in patient's medical care, obtaining relevant history and information from patient and/or patient's family, and personal review of imaging and lab work. Greater than 50% of the time was spent counseling and coordinating care related to the above assessment and plan.    *Please note that this is a verbal dictation therefore any spelling or grammatical errors are due to the "[employer]" system interpretation.

## 2022-07-19 NOTE — Consult Note (Signed)
Cardiology Consultation   Patient ID: [name] MRN: [id number]; DOB: [birth year]  Admit date: 07/17/2022 Date of Consult: 07/19/2022  PCP:  Samantha Chalk, FNP   [facility] Providers Cardiologist:  Dr. Eden Lutz  Patient Profile:   Samantha Lutz is a 72 y.o. female with a hx of metastatic rectal cancer, hypothyroidism, esophageal varices, onbstructive jaundice secondary to central liver mass s/p biliary stenting admitted with FTT and AKI who is being seen 07/19/2022 for the evaluation of elevated troponin at the request of Dr. Sharolyn Lutz.  History of Present Illness:   Samantha Lutz has no prior cardiac history. She was admitted for FTT. She has metastatic rectal cancer. She converted to Afib with RVR this morning with rates in the 160s with SBP in the 60s. She was treated with IVF and converted to sinus rhythm. She remains in sinus rhythm. She had some chest tightness and HS troponin found elevated. Cardiology consulted.   EKG appears nonischemic.   Past Medical History:  Diagnosis Date   Anemia    remote hx.   Arthritis    Bi lat knee   Cancer (HCC) 07/2017   Rectal Cancer    Fatty liver    ultrasound December 2012   Hypothyroid 01/16/2013 dx   Irregular heartbeat    PER PATIENT REPORT; ONSET 20+YEARS AGO SAW CARDIOLOGY AT THE TIME C/O "ITS BEAT REALLY FAST FOR A MINUTE AND THEN STOPPED " DENIES ANY OTHER CARDIAC SX';  REPORTS CARDIO DID ECHO WHICH WAS NEGATIVE;  NOW COMES AND GOES ;    Lichen sclerosus    steroids as needed   Temporary low platelet count (HCC)    SEE LAST LABS IN EPIC    Past Surgical History:  Procedure Laterality Date   APPENDECTOMY  1967   BILIARY STENT PLACEMENT N/A 04/18/2022   Procedure: BILIARY STENT PLACEMENT;  Surgeon: Samantha Boop, MD;  Location: [facility];  Service: Gastroenterology;  Laterality: N/A;   CESAREAN SECTION  1976 & 1983   COLONOSCOPY  08/09/2017   danis - polyps   ERCP N/A 04/18/2022   Procedure:  ENDOSCOPIC RETROGRADE CHOLANGIOPANCREATOGRAPHY (ERCP);  Surgeon: Samantha Boop, MD;  Location: [facility];  Service: Gastroenterology;  Laterality: N/A;   ESOPHAGOGASTRODUODENOSCOPY (EGD) WITH PROPOFOL N/A 04/21/2022   Procedure: ESOPHAGOGASTRODUODENOSCOPY (EGD) WITH PROPOFOL;  Surgeon: Samantha Boop, MD;  Location: [facility];  Service: Gastroenterology;  Laterality: N/A;   ILEOSTOMY CLOSURE N/A 07/10/2018   Procedure: ILEOSTOMY LOOP TAKEDOWN WITH TAP BLOCK;  Surgeon: Samantha Soda, MD;  Location: [facility];  Service: General;  Laterality: N/A;   IR BILIARY DRAIN PLACEMENT WITH CHOLANGIOGRAM  05/11/2022   IR CONVERT BILIARY DRAIN TO INT EXT BILIARY DRAIN  04/26/2022   IR EXCHANGE BILIARY DRAIN  05/11/2022   IR EXCHANGE BILIARY DRAIN  06/30/2022   IR IMAGING GUIDED PORT INSERTION  09/29/2019   IR PERCUTANEOUS TRANSHEPATIC CHOLANGIOGRAM  04/21/2022   OSTOMY N/A 12/21/2017   Procedure: DIVERTING LOOP OSTOMY;  Surgeon: Samantha Soda, MD;  Location: [facility];  Service: General;  Laterality: N/A;   PROCTOSCOPY N/A 12/21/2017   Procedure: RIGID PROCTOSCOPY;  Surgeon: Samantha Soda, MD;  Location: [facility];  Service: General;  Laterality: N/A;   RADIOLOGY WITH ANESTHESIA N/A 04/26/2022   Procedure: PERCUTANEOUS TRANSHEPATIC  CHOLANGIOGRAM;  Surgeon: Samantha Come, MD;  Location: [facility];  Service: Anesthesiology;  Laterality: N/A;   STENT REMOVAL  04/21/2022   Procedure: STENT REMOVAL;  Surgeon: Samantha Boop, MD;  Location:  [facility];  Service: Gastroenterology;;   XI ROBOTIC ASSISTED LOWER ANTERIOR RESECTION N/A 12/21/2017   Procedure: XI ROBOTIC ASSISTED LOWER ANTERIOR RESECTION;  Surgeon: Samantha Soda, MD;  Location: [facility];  Service: General;  Laterality: N/A;     Home Medications:  Prior to Admission medications   Medication Sig Start Date End Date Taking? Authorizing Provider  levothyroxine (SYNTHROID) 50 MCG tablet Take 1 tablet by mouth once daily 05/23/22  Yes Samantha Artist, MD  Nutritional  Supplements (ENSURE ORIGINAL) LIQD Take 237 mLs by mouth See admin instructions. Drink 237 ml's (1 CHOCOLATE shake) by mouth once a day   Yes [provider]  ondansetron (ZOFRAN) 8 MG tablet Take 1 tablet (8 mg total) by mouth every 8 (eight) hours as needed for nausea or vomiting. 06/28/22  Yes Samantha Artist, MD  oxyCODONE (OXY IR/ROXICODONE) 5 MG immediate release tablet Take 1-2 mg by mouth every 4 (four) hours as needed for severe pain or breakthrough pain.   Yes [provider]  prochlorperazine (COMPAZINE) 10 MG tablet Take 1 tablet (10 mg total) by mouth every 6 (six) hours as needed for nausea or vomiting. 06/21/22  Yes Samantha Snare, NP  lidocaine-prilocaine (EMLA) cream Apply 1 Application topically as needed. Apply to port site 1-2 hours prior to use Patient taking differently: Apply 1 Application topically as needed (for port access- one to two hours prior to accessing). 12/12/21   Samantha Snare, NP  magnesium oxide (MAG-OX) 400 (240 Mg) MG tablet Take 1 tablet (400 mg total) by mouth 2 (two) times daily. 02/22/22   Samantha Artist, MD  polyethylene glycol (MIRALAX / GLYCOLAX) 17 g packet Take 17 g by mouth daily as needed for mild constipation. 07/01/22   Samantha Fallen, MD  potassium chloride (MICRO-K) 10 MEQ CR capsule Take 10 mEq by mouth in the morning, at noon, and at bedtime.    [provider]  predniSONE (DELTASONE) 5 MG tablet Take 1 tablet (5 mg total) by mouth daily with breakfast. 06/21/22   Samantha Snare, NP  SYSTANE ULTRA PF 0.4-0.3 % SOLN Place 1 drop into both eyes 3 (three) times daily as needed (for dryness).    [provider]    Inpatient Medications: Scheduled Meds:  Chlorhexidine Gluconate Cloth  6 each Topical Daily   enoxaparin (LOVENOX) injection  30 mg Subcutaneous Q24H   lactose free nutrition  237 mL Oral TID WC   levothyroxine  50 mcg Oral Q0600   potassium chloride  40 mEq Oral BID   sodium chloride flush   10-40 mL Intracatheter Q12H   Continuous Infusions:  sodium chloride 100 mL/hr at 07/18/22 0057   magnesium sulfate bolus IVPB     PRN Meds: acetaminophen **OR** acetaminophen, albuterol, alum & mag hydroxide-simeth, metoprolol tartrate, ondansetron **OR** ondansetron (ZOFRAN) IV, oxyCODONE, senna-docusate, sodium chloride flush, sodium chloride flush  Allergies:    Allergies  Allergen Reactions   Epinephrine Palpitations   Losartan Hives   Latex Itching and Other (See Comments)    Skin redness   Vectibix [Panitumumab] Other (See Comments) and Cough    Non-productive coughing with chest tightness.   Patient had hypersensitivity reaction to Vectibix. See progress note from 11/01/2021 at 4:33 PM. Patient able to complete infusion.     Social History:   Social History   Socioeconomic History   Marital status: Widowed    Spouse name: Not on file   Number of children: Not on file  Years of education: Not on file   Highest education level: Not on file  Occupational History   Not on file  Tobacco Use   Smoking status: Never   Smokeless tobacco: Never  Vaping Use   Vaping Use: Never used  Substance and Sexual Activity   Alcohol use: No   Drug use: No   Sexual activity: Yes    Birth control/protection: Post-menopausal  Other Topics Concern   Not on file  Social History Narrative   Not on file   Social Determinants of Health   Financial Resource Strain: Low Risk  (11/01/2021)   Overall Financial Resource Strain (CARDIA)    Difficulty of Paying Living Expenses: Not hard at all  Food Insecurity: No Food Insecurity (07/17/2022)   Hunger Vital Sign    Worried About Running Out of Food in the Last Year: Never true    Ran Out of Food in the Last Year: Never true  Transportation Needs: No Transportation Needs (07/17/2022)   PRAPARE - Administrator, Civil Service (Medical): No    Lack of Transportation (Non-Medical): No  Physical Activity: Not on file  Stress:  Not on file  Social Connections: Socially Isolated (11/01/2021)   Social Connection and Isolation Panel [NHANES]    Frequency of Communication with Friends and Family: More than three times a week    Frequency of Social Gatherings with Friends and Family: More than three times a week    Attends Religious Services: Never    Database administrator or Organizations: No    Attends Banker Meetings: Never    Marital Status: Widowed  Intimate Partner Violence: Not At Risk (07/17/2022)   Humiliation, Afraid, Rape, and Kick questionnaire    Fear of Current or Ex-Partner: No    Emotionally Abused: No    Physically Abused: No    Sexually Abused: No    Family History:    Family History  Problem Relation Age of Onset   Diabetes Mother    Hypertension Mother    COPD Mother    Glaucoma Father 82   Stroke Maternal Grandmother 63   Hypertension Maternal Grandmother    Rheum arthritis Sister 96   Hypertension Daughter    Breast cancer Cousin    Colon cancer Neg Hx    Esophageal cancer Neg Hx    Liver cancer Neg Hx    Pancreatic cancer Neg Hx    Stomach cancer Neg Hx    Rectal cancer Neg Hx      ROS:  Please see the history of present illness.   All other ROS reviewed and negative.     Physical Exam/Data:   Vitals:   07/18/22 1402 07/18/22 2007 07/19/22 0400 07/19/22 0540  BP: (!) 117/49 108/61 109/77 (!) 112/49  Pulse: 73 78 (!) 108 92  Resp: 16 18 18    Temp:  (!) 97.5 F (36.4 C) 97.6 F (36.4 C)   TempSrc:   Oral   SpO2: 100% 100% 100%   Weight:      Height:        Intake/Output Summary (Last 24 hours) at 07/19/2022 1117 Last data filed at 07/19/2022 1032 Gross per 24 hour  Intake 1381.56 ml  Output 320 ml  Net 1061.56 ml      07/17/2022    7:00 PM 07/01/2022    5:00 AM 06/01/2022   11:34 AM  Last 3 Weights  Weight (lbs) 178 lb 12.7 oz 178 lb 12.7 oz 175  lb  Weight (kg) 81.1 kg 81.1 kg 79.379 kg     Body mass index is 32.7 kg/m.  Exam per MD  EKG:   The EKG was personally reviewed and demonstrates:  sinus rhythm with HR 80, flattened T waves Telemetry:  Telemetry was personally reviewed and demonstrates:  Afib RVR 170s, now SR in the 80s, PVCs  Relevant CV Studies:  none  Laboratory Data:  High Sensitivity Troponin:   Recent Labs  Lab 07/19/22 0934  TROPONINIHS 1,075*     Chemistry Recent Labs  Lab 07/17/22 1123 07/18/22 0331 07/19/22 0343 07/19/22 0934  NA 133* 133* 134*  --   K 3.4* 3.8 3.2*  --   CL 106 106 110  --   CO2 12* 15* 13*  --   GLUCOSE 99 83 90  --   BUN 58* 60* 52*  --   CREATININE 2.70* 2.55* 2.14*  --   CALCIUM 7.7* 8.1* 7.7*  --   MG  --   --   --  1.6*  GFRNONAA 18* 19* 24*  --   ANIONGAP 15 12 11   --     Recent Labs  Lab 07/17/22 1123 07/18/22 0331 07/19/22 0343  PROT 5.4* 5.4* 5.0*  ALBUMIN 2.1* 2.0* 1.9*  AST 93* 103* 92*  ALT 38 41 39  ALKPHOS 124 122 116  BILITOT 21.5* 30.7* 26.1*   Lipids No results for input(s): "CHOL", "TRIG", "HDL", "LABVLDL", "LDLCALC", "CHOLHDL" in the last 168 hours.  Hematology Recent Labs  Lab 07/17/22 1123 07/18/22 0331 07/19/22 0934  WBC 5.6 5.0 5.5  RBC 2.94* 2.92* 2.59*  HGB 10.2* 9.9* 9.0*  HCT 28.9* 28.6* 25.8*  MCV 98.3 97.9 99.6  MCH 34.7* 33.9 34.7*  MCHC 35.3 34.6 34.9  RDW 15.9* 15.9* 16.5*  PLT 113* 107* 111*   Thyroid  Recent Labs  Lab 07/19/22 0934  TSH 2.553    BNPNo results for input(s): "BNP", "PROBNP" in the last 168 hours.  DDimer No results for input(s): "DDIMER" in the last 168 hours.   Radiology/Studies:  DG Chest Port 1 View  Result Date: 07/19/2022 CLINICAL DATA:  72 year old female with history of chest pain and increasing shortness of breath. History of metastatic rectal cancer. EXAM: PORTABLE CHEST 1 VIEW COMPARISON:  Chest x-ray 06/30/2022. FINDINGS: Right internal jugular single-lumen power porta cath with tip terminating in the right atrium. Percutaneous biliary drain projecting over the right upper quadrant  of the abdomen, partially imaged. Elevation of the right hemidiaphragm. Increasing opacity throughout the right mid to lower hemithorax which may reflect worsening areas of atelectasis and/or consolidation, along with increasing moderate right pleural effusion. Left lung appears clear. No pneumothorax. No evidence of pulmonary edema. Heart size is mildly enlarged. The patient is rotated to the right on today's exam, resulting in distortion of the mediastinal contours and reduced diagnostic sensitivity and specificity for mediastinal pathology. IMPRESSION: 1. Worsening aeration throughout the right mid to lower lung, which may reflect increasing atelectasis and/or consolidation in the underlying lung and enlarging moderate right pleural effusion. Metastatic disease to this region is not excluded. 2. Support apparatus, as above. Electronically Signed   By: Trudie Reed M.D.   On: 07/19/2022 06:07   CT ABDOMEN PELVIS WO CONTRAST  Result Date: 07/17/2022 CLINICAL DATA:  History of metastatic rectal cancer with biliary obstruction status post stent placement presenting with 2 weeks of generalized weakness. * Tracking Code: BO * EXAM: CT ABDOMEN AND PELVIS WITHOUT CONTRAST TECHNIQUE: Multidetector CT  imaging of the abdomen and pelvis was performed following the standard protocol without IV contrast. RADIATION DOSE REDUCTION: This exam was performed according to the departmental dose-optimization program which includes automated exposure control, adjustment of the mA and/or kV according to patient size and/or use of iterative reconstruction technique. COMPARISON:  CT abdomen and pelvis dated 05/25/2022 FINDINGS: Lower chest: Partially imaged central venous catheter tip terminates in the right atrium. Similar right lower lobe mass. No pleural effusion or pneumothorax demonstrated. Partially imaged heart size is normal. Hepatobiliary: No focal hepatic lesions. Internal external biliary drain again seen with distal  aspect of stent now extending to the third portion of the duodenum. No intra or extrahepatic biliary ductal dilation. Cholelithiasis. Pancreas: No focal lesions or main ductal dilation. Spleen: Normal in size without focal abnormality. Adrenals/Urinary Tract: No adrenal nodules. No suspicious renal mass or hydronephrosis. Punctate nonobstructing bilateral renal stones. Trace nondependent gas within the urinary bladder. Stomach/Bowel: Normal appearance of the stomach. Postsurgical changes of the rectum with similar presacral soft tissue thickening/edema. Previously noted anal rectal wall thickening is suboptimally evaluated due to underdistention. Appendix is not discretely seen. Vascular/Lymphatic: Aortic atherosclerosis. Paraesophageal varices. No enlarged abdominal or pelvic lymph nodes. Reproductive: No adnexal masses. Other: No free fluid, fluid collection, or free air. Musculoskeletal: No acute or abnormal lytic or blastic osseous lesions. Multilevel degenerative changes of the partially imaged thoracic and lumbar spine. IMPRESSION: 1. Internal external biliary drain again seen with distal aspect of stent now extending to the third portion of the duodenum. No intra or extrahepatic biliary ductal dilation. 2. Similar right lower lobe lung mass. 3. Postsurgical changes of the rectum with similar presacral soft tissue thickening/edema. Previously noted anorectal wall thickening is suboptimally evaluated due to underdistention. 4. Trace nondependent gas within the urinary bladder. Recommend correlation with history of recent instrumentation and urinalysis. 5.  Aortic Atherosclerosis (ICD10-I70.0). Electronically Signed   By: Agustin Cree M.D.   On: 07/17/2022 15:18     Assessment and Plan:   Afib with RVR Initially HR in the 140-160s with SBP 60s - treated with IVF - converted back to sinus rhythm with HR now in the 80-90s - if RVR, could treat with amiodarone vs BB depending on BP   Elevated troponin -  would  not trend - no ischemic evaluation planned - no heparin   Metastatic rectal cancer Palliative medicine on board   Risk Assessment/Risk Scores:    For questions or updates, please contact [facility] Please consult [email or web address] for contact info under    Signed, Marcelino Duster, PA  07/19/2022 11:17 AM

## 2022-07-19 NOTE — Progress Notes (Signed)
2D echo attempted, patient is comfort care per RN.

## 2022-07-19 NOTE — Plan of Care (Signed)

## 2022-07-19 NOTE — Progress Notes (Signed)
   07/19/22 1231  Spiritual Encounters  Type of Visit Initial  Care provided to: Family  Referral source Physician  Reason for visit End-of-life  OnCall Visit No  Spiritual Framework  Presenting Themes Significant life change;Impactful experiences and emotions  Community/Connection Family  Patient Stress Factors Health changes  Family Stress Factors Loss;Major life changes  Interventions  Spiritual Care Interventions Made Established relationship of care and support;Compassionate presence;Reflective listening;Normalization of emotions;Bereavement/grief support;Encouragement;Supported grief process  Intervention Outcomes  Outcomes Connection to spiritual care;Awareness of support;Reduced isolation  Spiritual Care Plan  Spiritual Care Issues Still Outstanding Referring to oncoming chaplain for further support  Follow up plan  Daughter has asked that a chaplain be present when the rest of the family arrives.   Patient appeared to be sleeping. Chaplain provided spiritual and emotional support for patient's daughter Delorise Jackson. Daughter is grieving and tearful but appears to be accepting of the patient's prognosis. Patient's other daughter and her grandchildren are expected to arrive later today 07/19/2022.   Arlyce Dice, Chaplain Resident

## 2022-07-19 NOTE — Progress Notes (Signed)
CRITICAL VALUE STICKER  CRITICAL VALUE: Troponin 1075  RECEIVER (on-site recipient of call): Almira Bar LPN  DATE & TIME NOTIFIED: 07/19/2022 at 1034  MESSENGER (representative from lab): Chancy Hurter  MD NOTIFIED: Andreas Newport MD  TIME OF NOTIFICATION: 1038  RESPONSE: Patient transferred to ICU/stepdown

## 2022-07-20 DIAGNOSIS — N179 Acute kidney failure, unspecified: Secondary | ICD-10-CM | POA: Diagnosis not present

## 2022-07-20 LAB — URINE CULTURE: Culture: >=100000 — AB

## 2022-07-20 NOTE — Discharge Summary (Signed)
Physician Discharge Summary   Patient: Samantha Lutz MRN: [id number] DOB: 02/28/1950  Admit date:     07/17/2022  Discharge date: 07/20/22  Discharge Physician: Briant Cedar   PCP: Deborah Chalk, FNP   Recommendations at discharge:  [facility] place  Discharge Diagnoses: Principal Problem:   AKI (acute kidney injury) [facility]) Active Problems:   Malnutrition of moderate degree   Metastatic cancer (HCC)   High risk medication use   Cancer associated pain   Need for emotional support   Counseling and coordination of care   Palliative care encounter   Hyperbilirubinemia   Concern about end of life   DNR (do not resuscitate)   Elevated troponin    Hospital Course: 72 year old female with metastatic rectal cancer, hypothyroidism, esophageal varices, and obstructive jaundice secondary to central liver mass, s/p biliary stenting.  Patient was admitted with failure to thrive, found to have acute renal failure, worsening hepatic function.    Today, patient continues to appear comfortable laying in bed with eyes closed.  Has not communicated much with daughters at bedside.  Patient to be transferred to [facility]    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   CT scan of abdomen with persistent finding as above   Elevated troponin Possibly demand ischemia versus NSTEMI vs PE EKG with no acute ST changes Cardiology consulted, appreciate recs No further interventions noted, switched to comfort care   Anemia of chronic disease   Chronic thrombocytopenia   Failure to thrive   Hypothyroidism   Widely metastatic rectal cancer Goals of care discussion Due to overall poor prognosis, palliative consulted, patient was  switched to DNR on 6/19 and made comfort care Discharged to [facility] hospice[facility]  Obesity       Consultants: Palliative, oncology, cardiology Procedures performed: None Disposition: Hospice care Diet recommendation: As tolerated    DISCHARGE MEDICATION: Allergies as of 07/20/2022       Reactions   Epinephrine Palpitations   Losartan Hives   Latex Itching, Other (See Comments)   Skin redness   Vectibix [panitumumab] Other (See Comments), Cough   Non-productive coughing with chest tightness.  Patient had hypersensitivity reaction to Vectibix. See progress note from 11/01/2021 at 4:33 PM. Patient able to complete infusion.         Medication List     STOP taking these medications    Ensure Original Liqd   levothyroxine 50 MCG tablet Commonly known as: SYNTHROID   lidocaine-prilocaine cream Commonly known as: EMLA   magnesium oxide 400 (240 Mg) MG tablet Commonly known as: MAG-OX   ondansetron 8 MG tablet Commonly known as: ZOFRAN   oxyCODONE 5 MG immediate release tablet Commonly known as: Oxy IR/ROXICODONE   polyethylene glycol 17 g packet Commonly known as: MIRALAX / GLYCOLAX   potassium chloride 10 MEQ CR capsule Commonly known as: MICRO-K   predniSONE 5 MG tablet Commonly known as: DELTASONE   prochlorperazine 10 MG tablet Commonly known as: COMPAZINE   Systane Ultra PF 0.4-0.3 % Soln Generic drug: Polyethyl Glyc-Propyl Glyc PF        Discharge Exam: Filed Weights   07/17/22 1900  Weight: 81.1 kg   General: NAD, lethargic, jaundiced Cardiovascular: S1, S2 present Respiratory: CTAB Abdomen: Soft, nontender, nondistended, bowel sounds present Musculoskeletal:  No bilateral pedal edema noted Skin: Jaundiced Psychiatry: Unable to assess  Condition at discharge: poor  The results of significant diagnostics from this hospitalization (including imaging, microbiology, ancillary and laboratory) are listed below for reference.    Imaging Studies: DG Chest Port 1 View  Result Date: 07/19/2022 CLINICAL DATA:  72 year old female with history of chest pain and increasing shortness of breath. History of metastatic rectal cancer. EXAM: PORTABLE CHEST 1 VIEW COMPARISON:  Chest x-ray 06/30/2022. FINDINGS: Right internal jugular single-lumen power porta cath with tip terminating in the right atrium. Percutaneous biliary drain projecting over the right upper quadrant of the abdomen, partially imaged. Elevation of the right hemidiaphragm. Increasing opacity throughout the right mid to lower hemithorax which may reflect worsening areas of atelectasis and/or consolidation, along with increasing moderate right pleural effusion. Left lung appears clear. No pneumothorax. No evidence of pulmonary edema. Heart size is mildly enlarged. The patient is rotated to the right on today's exam, resulting in distortion of the mediastinal contours and reduced diagnostic sensitivity and specificity for mediastinal pathology. IMPRESSION: 1. Worsening aeration throughout the right mid to lower lung, which may reflect increasing atelectasis and/or consolidation in the underlying lung and enlarging moderate right pleural effusion. Metastatic disease to this region is not excluded. 2. Support apparatus, as above. Electronically Signed   By: Trudie Reed M.D.   On: 07/19/2022 06:07   CT ABDOMEN PELVIS WO CONTRAST  Result Date: 07/17/2022 CLINICAL DATA:  History of metastatic rectal cancer with biliary obstruction status post stent placement presenting with 2 weeks of generalized weakness. * Tracking Code: BO * EXAM: CT ABDOMEN AND PELVIS WITHOUT CONTRAST TECHNIQUE: Multidetector CT imaging of the abdomen and pelvis was performed following the standard protocol without IV contrast. RADIATION DOSE REDUCTION: This exam was performed according to the departmental dose-optimization program which includes automated exposure control, adjustment of the mA and/or kV  according to patient size and/or use of iterative reconstruction technique. COMPARISON:  CT abdomen and pelvis dated 05/25/2022 FINDINGS: Lower chest: Partially imaged central venous catheter tip terminates in the right atrium. Similar right lower lobe mass. No pleural effusion or pneumothorax demonstrated. Partially imaged heart size is normal. Hepatobiliary: No focal hepatic lesions. Internal external biliary drain again seen with distal aspect of stent now extending to the third portion of the duodenum. No intra or extrahepatic biliary ductal dilation. Cholelithiasis. Pancreas: No focal lesions or main ductal dilation. Spleen: Normal in size without focal abnormality. Adrenals/Urinary Tract: No adrenal nodules. No suspicious renal mass or hydronephrosis. Punctate nonobstructing bilateral renal stones. Trace nondependent gas within the urinary bladder. Stomach/Bowel: Normal appearance of the stomach. Postsurgical changes of the rectum with similar presacral soft tissue thickening/edema. Previously noted anal rectal wall thickening is suboptimally evaluated due to underdistention. Appendix is not discretely seen. Vascular/Lymphatic: Aortic atherosclerosis. Paraesophageal varices. No enlarged abdominal or pelvic lymph nodes. Reproductive: No adnexal masses. Other: No free fluid, fluid collection, or free air. Musculoskeletal: No acute or abnormal lytic or blastic osseous lesions. Multilevel degenerative changes of the partially imaged thoracic and lumbar spine. IMPRESSION: 1. Internal external biliary drain again seen with distal aspect of stent now extending to the third portion of the duodenum. No intra or extrahepatic biliary ductal dilation. 2. Similar right lower lobe lung mass. 3. Postsurgical changes of the rectum with similar presacral soft tissue thickening/edema. Previously noted anorectal wall thickening is suboptimally evaluated due to underdistention. 4. Trace nondependent gas within the urinary  bladder. Recommend correlation with history of recent instrumentation and  urinalysis. 5.  Aortic Atherosclerosis (ICD10-I70.0). Electronically Signed   By: Agustin Cree M.D.   On: 07/17/2022 15:18   IR EXCHANGE BILIARY DRAIN  Result Date: 06/30/2022 INDICATION: 72 year old female with biliary obstruction. EXAM: IMAGE GUIDED BILIARY DRAIN EXCHANGE MEDICATIONS: None ANESTHESIA/SEDATION: Moderate (conscious) sedation was employed during this procedure. A total of Versed 2.0 mg and Fentanyl 25 mcg was administered intravenously by the radiology nurse. Total intra-service moderate Sedation Time: 20 minutes. The patient's level of consciousness and vital signs were monitored continuously by radiology nursing throughout the procedure under my direct supervision. FLUOROSCOPY: Radiation Exposure Index (as provided by the fluoroscopic device): 80 mGy Kerma COMPLICATIONS: None PROCEDURE: Informed written consent was obtained from the patient after a thorough discussion of the procedural risks, benefits and alternatives. All questions were addressed. Maximal Sterile Barrier Technique was utilized including caps, mask, sterile gowns, sterile gloves, sterile drape, hand hygiene and skin antiseptic. A timeout was performed prior to the initiation of the procedure. Patient was positioned supine on the image intensifier table. The patient was then prepped and draped in the usual sterile fashion. 1% lidocaine was used for local anesthesia at the site of drain insertion. Ultrasound survey was then performed of the left liver, identifying no adequate window for attempt at ultrasound-guided puncture of the left-sided ducts. Contrast was injected through the indwelling 12 [place] drain. The drain was ligated and wire was passed into the duodenum. An 8 French 35 cm sheath was placed. Contrast was injected through the left-sided drain identifying that there was no meaningful communication to the left-sided ducts. This drain was removed. We  then attempted to navigate a Kumpe the catheter into the left-sided ducts through the 8 French sheath. Again, no retrograde pathway into the left-sided ducts was identified. Therefore, as there was no contrast opacifying the left-sided ducts, a fluoroscopic attempt at puncture of the left-sided ducts could not be performed. Routine exchange was then performed for a new 12 [place] biliary internal external drain. Once the drain was positioned contrast was injected and a final image was stored. Patient tolerated the procedure well and remained hemodynamically stable throughout. No complications were encountered and no significant blood loss. IMPRESSION: Status post routine exchange of a right-sided internal external biliary drain for a new 12 [place] drain. The images demonstrate no meaningful communication to the left-sided ducts, and so the secondary parallel externalized drain was removed. Signed, Yvone Neu. Miachel Roux, RPVI [facility] Radiology Electronically Signed   By: Gilmer Mor D.O.   On: 06/30/2022 18:40   DG Thoracic Spine 2 View  Result Date: 06/30/2022 CLINICAL DATA:  Fall, upper back pain EXAM: THORACIC SPINE 2 VIEWS COMPARISON:  None Available. FINDINGS: There is no evidence of thoracic spine fracture. Alignment is normal. No other significant bone abnormalities are identified. Right upper quadrant biliary/gallbladder drains and right Port-A-Cath in place. IMPRESSION: No acute bony abnormality. Electronically Signed   By: Charlett Nose M.D.   On: 06/30/2022 01:47   DG Ribs Unilateral W/Chest Right  Result Date: 06/30/2022 CLINICAL DATA:  Fall EXAM: RIGHT RIBS AND CHEST - 3+ VIEW COMPARISON:  04/25/2022 FINDINGS: Right Port-A-Cath remains in place, unchanged. Biliary during and probable cholecystostomy tube project in the right upper quadrant. There is right base atelectasis. Mild elevation of the right hemidiaphragm. Stable nodules in the  left upper lobe. Heart and mediastinal contours within normal limits. No effusion or pneumothorax. IMPRESSION: Mild elevation of the right hemidiaphragm with right base atelectasis. Left  upper lobe nodules, unchanged. Electronically Signed   By: Charlett Nose M.D.   On: 06/30/2022 01:46    Microbiology: Results for orders placed or performed during the hospital encounter of 07/17/22  Urine Culture (for pregnant, neutropenic or urologic patients or patients with an indwelling urinary catheter)     Status: Abnormal (Preliminary result)   Collection Time: 07/19/22 10:27 AM   Specimen: Urine, Clean Catch  Result Value Ref Range Status   Specimen Description   Final    URINE, CLEAN CATCH Performed at [facility], [address] [place], [place] [address]    Special Requests   Final    NONE Performed at [facility], [address], [place], [place] [address]    Culture (A)  Final    >=100,000 COLONIES/mL GRAM NEGATIVE RODS IDENTIFICATION AND SUSCEPTIBILITIES TO FOLLOW Performed at [facility], [address] [place], [place] [address]    Report Status PENDING  Incomplete    Labs: CBC: Recent Labs  Lab 07/17/22 1123 07/18/22 0331 07/19/22 0934  WBC 5.6 5.0 5.5  NEUTROABS 4.3  --   --   HGB 10.2* 9.9* 9.0*  HCT 28.9* 28.6* 25.8*  MCV 98.3 97.9 99.6  PLT 113* 107* 111*   Basic Metabolic Panel: Recent Labs  Lab 07/17/22 1123 07/18/22 0331 07/19/22 0343 07/19/22 0934  NA 133* 133* 134*  --   K 3.4* 3.8 3.2*  --   CL 106 106 110  --   CO2 12* 15* 13*  --   GLUCOSE 99 83 90  --   BUN 58* 60* 52*  --   CREATININE 2.70* 2.55* 2.14*  --   CALCIUM 7.7* 8.1* 7.7*  --   MG  --   --   --  1.6*  PHOS  --   --   --  3.4   Liver Function Tests: Recent Labs  Lab 07/17/22 1123 07/18/22 0331 07/19/22 0343  AST 93* 103* 92*  ALT 38 41 39  ALKPHOS 124 122 116  BILITOT 21.5* 30.7* 26.1*  PROT 5.4* 5.4* 5.0*  ALBUMIN 2.1* 2.0* 1.9*    CBG: No results for input(s): "GLUCAP" in the last 168 hours.  Discharge time spent: greater than 30 minutes.  Signed: Briant Cedar, MD [facility] 07/20/2022

## 2022-07-20 NOTE — Progress Notes (Signed)
[facility] [address] [employer] engineer, contracting [facility]) [facility] Liaison Note  Received referral from [facility], Jonetta Speak, for patient/family interest in [facility]. Spoke to daughter's at bedside who confirmed interest.  Explained hospice philosophy, services and team approach to care. Daughters confirmed their father was at [facility] about a year ago and they would like to move forward with transfer if patient appropriate.  Patient eligibility has been confirmed and bed is available today. Family would like to move forward with transfer.  RN--please call [facility] for report prior to patient leaving unit at [phone].  Please send signed and completed DNR with patient on transfer.  Please leave all IV's in for ongoing symptom management needs.  Thank you for the opportunity to participate in this patient's care.  Doreatha Martin, RN, BSN [facility] Liaison [phone]

## 2022-07-20 NOTE — TOC Progression Note (Signed)
Transition of Care [facility]) - Progression Note    Patient Details  Name: Samantha Lutz MRN: [id number] Date of Birth: [birth year]  Transition of Care [facility]) CM/SW Contact  Beckie Busing, RN Phone Number: 07/20/2022, 12:26 PM  Clinical Narrative:    [facility] consulted for residential hospice. CM at bedside with both daughters present ([name] & [name]). CM offered choice for Hospice agency. Daughter both agreeable to [facility]. CM has contacted liasion Doreatha Martin RN) Revonda Standard will initiate referral shortly. TOC will continue to follow.   Expected Discharge Plan: Hospice Medical Facility Barriers to Discharge: Other (must enter comment) (awaiting residential hospice consult)  Expected Discharge Plan and Services In-house Referral: Hospice / Palliative Care, Clinical Social Work Discharge Planning Services: NA Post Acute Care Choice: Residential Hospice Bed Living arrangements for the past 2 months: Single Family Home                 DME Arranged: N/A DME Agency: NA                   Social Determinants of Health (SDOH) Interventions SDOH Screenings   Food Insecurity: No Food Insecurity (07/17/2022)  Housing: Patient Declined (07/17/2022)  Transportation Needs: No Transportation Needs (07/17/2022)  Utilities: Not At Risk (07/17/2022)  Depression (PHQ2-9): Low Risk  (11/01/2018)  Financial Resource Strain: Low Risk  (11/01/2021)  Social Connections: Socially Isolated (11/01/2021)  Tobacco Use: Low Risk  (07/17/2022)    Readmission Risk Interventions    07/20/2022   12:17 PM 06/30/2022   11:34 AM  Readmission Risk Prevention Plan  Transportation Screening Complete Complete  PCP or Specialist Appt within 5-7 Days  Complete  PCP or Specialist Appt within 3-5 Days Complete   Home Care Screening  Complete  Medication Review (RN CM)  Complete  HRI or Home Care Consult Complete   Social Work Consult for Recovery Care Planning/Counseling Complete   Palliative Care Screening  Complete   Medication Review Oceanographer) Referral to Pharmacy

## 2022-07-20 NOTE — TOC Transition Note (Signed)
Transition of Care [facility]) - CM/SW Discharge Note   Patient Details  Name: Samantha Lutz MRN: [id number] Date of Birth: Apr 16, 1950  Transition of Care [facility]) CM/SW Contact:  Beckie Busing, RN Phone Number:[phone]  07/20/2022, 4:04 PM   Clinical Narrative:    Patient discharging to [facility]. Transportation has been arranged per PTAR. Discharge packet is at nurses station. Message has been sent to MD to complete DNR. See Hospice note for report info.    Final next level of care: Hospice Medical Facility Barriers to Discharge: No Barriers Identified   Patient Goals and CMS Choice CMS Medicare.gov Compare Post Acute Care list provided to:: Patient Represenative (must comment) (Adult daughters at bedside) Choice offered to / list presented to : Adult Children  Discharge Placement                         Discharge Plan and Services Additional resources added to the After Visit Summary for   In-house Referral: Hospice / Palliative Care, Clinical Social Work Discharge Planning Services: NA Post Acute Care Choice: Residential Hospice Bed          DME Arranged: N/A DME Agency: NA       HH Arranged: NA HH Agency: NA        Social Determinants of Health (SDOH) Interventions SDOH Screenings   Food Insecurity: No Food Insecurity (07/17/2022)  Housing: Patient Declined (07/17/2022)  Transportation Needs: No Transportation Needs (07/17/2022)  Utilities: Not At Risk (07/17/2022)  Depression (PHQ2-9): Low Risk  (11/01/2018)  Financial Resource Strain: Low Risk  (11/01/2021)  Social Connections: Socially Isolated (11/01/2021)  Tobacco Use: Low Risk  (07/17/2022)     Readmission Risk Interventions    07/20/2022   12:17 PM 06/30/2022   11:34 AM  Readmission Risk Prevention Plan  Transportation Screening Complete Complete  PCP or Specialist Appt within 5-7 Days  Complete  PCP or Specialist Appt within 3-5 Days Complete   Home Care Screening  Complete  Medication  Review (RN CM)  Complete  HRI or Home Care Consult Complete   Social Work Consult for Recovery Care Planning/Counseling Complete   Palliative Care Screening Complete   Medication Review Oceanographer) Referral to Pharmacy

## 2022-07-20 NOTE — Progress Notes (Signed)
Daily Progress Note   Patient Name: Samantha Lutz       Date: 07/20/2022 DOB: 19-Aug-1950  Age: 72 y.o. MRN#: [id number] Attending Physician: Briant Cedar, MD Primary Care Physician: Deborah Chalk, FNP Admit Date: 07/17/2022 Length of Stay: 2 days  Reason for Consultation/Follow-up: Establishing goals of care  Subjective:   CC: Patient appears comfortable laying in bed. Following up regarding complex medical decision making and symptom management.   Subjective:  Reviewed EMR prior to presenting to bedside.  At time of EMR review in the past 24 hours, patient has received IV dilaudid 0.5mg  x 3 doses.   Vented to bedside to check on patient.  Patient's 2 daughters were present at bedside.  Again introduced myself as a member of the palliative medicine team though Samantha Lutz remembered speaking with me yesterday.  Able to review continuing comfort focused care at this time.  Daughters do feel that the pain medications are helping to relieve patient's symptoms appropriately.  Noted can adjust medications based on patient's symptoms needed.  Patient very lethargic and so will not interact with this provider.  Discussed possibility of inpatient hospice transfer with daughters and they are agreeing to discuss this with social work.  If patient does not transfer to inpatient hospice, would anticipate in-hospital death.  All questions answered at that time.  Thank you family for allowing me to visit with him today.  Noted palliative medicine team would continue to follow along with patient's journey.  Discussed care with IDT after visit.  Objective:   Vital Signs:  BP (!) 107/54 (BP Location: Right Arm)   Pulse 82   Temp (!) 97.5 F (36.4 C) (Oral)   Resp 18   Ht 5\' 2"  (1.575 m)   Wt 81.1 kg   LMP  (LMP Unknown)   SpO2 100%   BMI 32.70 kg/m   Physical Exam: General: Lethargic, laying in bed, chronically ill appearing Eyes: Scleral icterus HENT: dry mucous  membranes Cardiovascular: Tachycardia noted Respiratory: no increased work of breathing noted, not in respiratory distress Abdomen: distended Skin: Jaundiced Neuro: Lethargic  Imaging:  I personally reviewed recent imaging.   Assessment & Plan:   Assessment: Patient is a 72 year old female with a past medical history of metastatic rectal cancer, hypothyroidism, esophageal varices, and obstructive jaundice secondary to central liver mass with biliary stent placement who was admitted on 07/17/2022 for management of dehydration and acute renal failure. Patient had recently been hospitalized and was recommended to return home with hospice at that time. Patient followed by Dr. Truett Perna, oncology. Palliative medicine team consulted to assist with complex medical decision making.   Recommendations/Plan: # Complex medical decision making/goals of care:  # Complex medical decision making/goals of care  -Patient was transition to full comfort focused care on 6/19 after discussion with patient and daughters.  Interventions that are not focused on comfort such as IV fluids, imaging, or lab work have already been discontinued.  Continue to provide medications that focus on symptom management of pain, dyspnea, and agitation in the setting of end-of-life care.  -TOC consulted to assist with possible inpatient hospice referral.  - Code Status: DNR   # Symptom management    -Pain/Dyspnea, acute in the setting of end-of-life care                               -Continue IV Dilaudid 0.5 mg IV every 1 hour as needed.  Continue to adjust based on patient's symptom burden.  If patient needing frequent dosing, may need to consider continuous infusion.                  -Anxiety/agitation, in the setting of end-of-life care                               -Continue IV Ativan 0.5 mg every 4 hours as needed. Continue to adjust based on patient's symptom burden.                                 -Continue IV Haldol 1 mg  every 4 hours as needed. Continue to adjust based on patient's symptom burden.                   -Secretions, in the setting of end-of-life care                               -Continue IV glycopyrrolate 0.2 mg every 4 hours as needed.   # Psychosocial Support:  -daughters  -previously consulted chaplain   # Discharge Planning: comfort focused care only. Anticipate in hosptial death vs transfer to inpatient hospice facility.   Discussed with: patient, daughters, RN, hospitalist, [facility]  Thank you for allowing the palliative care team to participate in the care Samantha Lutz.  Alvester Morin, DO Palliative Care Provider PMT # [phone]  If patient remains symptomatic despite maximum doses, please call PMT at [phone] between 0700 and 1900. Outside of these   *Please note that this is a verbal dictation therefore any spelling or grammatical errors are due to the "Dragon Medical One" system interpretation.

## 2022-07-21 LAB — URINE CULTURE

## 2022-07-31 DEATH — deceased

## 2022-09-20 ENCOUNTER — Encounter: Payer: Self-pay | Admitting: Oncology

## 2022-12-21 ENCOUNTER — Other Ambulatory Visit: Payer: Self-pay

## 2023-01-03 ENCOUNTER — Other Ambulatory Visit: Payer: Self-pay

## 2023-01-26 ENCOUNTER — Other Ambulatory Visit (HOSPITAL_BASED_OUTPATIENT_CLINIC_OR_DEPARTMENT_OTHER): Payer: Self-pay

## 2023-03-29 ENCOUNTER — Other Ambulatory Visit (HOSPITAL_COMMUNITY): Payer: Self-pay

## 2024-02-04 ENCOUNTER — Encounter: Payer: Self-pay | Admitting: Gastroenterology
